# Patient Record
Sex: Male | Born: 1950 | Race: Black or African American | Hispanic: No | Marital: Single | Smoking: Former smoker
Health system: Southern US, Community
[De-identification: ages and names within clinical notes are randomized; demographics above are authoritative.]

## PROBLEM LIST (undated history)

## (undated) DIAGNOSIS — I509 Heart failure, unspecified: Secondary | ICD-10-CM

## (undated) DIAGNOSIS — G4733 Obstructive sleep apnea (adult) (pediatric): Secondary | ICD-10-CM

## (undated) DIAGNOSIS — I272 Pulmonary hypertension, unspecified: Secondary | ICD-10-CM

## (undated) DIAGNOSIS — I10 Essential (primary) hypertension: Secondary | ICD-10-CM

## (undated) DIAGNOSIS — J449 Chronic obstructive pulmonary disease, unspecified: Secondary | ICD-10-CM

## (undated) DIAGNOSIS — I447 Left bundle-branch block, unspecified: Secondary | ICD-10-CM

## (undated) DIAGNOSIS — R591 Generalized enlarged lymph nodes: Secondary | ICD-10-CM

## (undated) DIAGNOSIS — R0600 Dyspnea, unspecified: Secondary | ICD-10-CM

## (undated) DIAGNOSIS — K279 Peptic ulcer, site unspecified, unspecified as acute or chronic, without hemorrhage or perforation: Secondary | ICD-10-CM

## (undated) DIAGNOSIS — J45909 Unspecified asthma, uncomplicated: Secondary | ICD-10-CM

## (undated) DIAGNOSIS — I420 Dilated cardiomyopathy: Secondary | ICD-10-CM

## (undated) DIAGNOSIS — K922 Gastrointestinal hemorrhage, unspecified: Secondary | ICD-10-CM

## (undated) DIAGNOSIS — K209 Esophagitis, unspecified: Secondary | ICD-10-CM

## (undated) DIAGNOSIS — I5041 Acute combined systolic (congestive) and diastolic (congestive) heart failure: Secondary | ICD-10-CM

## (undated) DIAGNOSIS — I2510 Atherosclerotic heart disease of native coronary artery without angina pectoris: Secondary | ICD-10-CM

---
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
## 2006-05-12 ENCOUNTER — Emergency Department (HOSPITAL_COMMUNITY): Admission: EM | Admit: 2006-05-12 | Discharge: 2006-05-12 | Payer: Self-pay | Admitting: Emergency Medicine

## 2010-07-12 ENCOUNTER — Other Ambulatory Visit: Payer: Self-pay | Admitting: Family Medicine

## 2010-07-12 ENCOUNTER — Ambulatory Visit
Admission: RE | Admit: 2010-07-12 | Discharge: 2010-07-12 | Disposition: A | Discharge status: Home / Self Care | Payer: BC Managed Care – PPO | Source: Ambulatory Visit | Attending: Family Medicine | Admitting: Family Medicine

## 2010-07-12 DIAGNOSIS — R0602 Shortness of breath: Secondary | ICD-10-CM

## 2014-11-05 ENCOUNTER — Ambulatory Visit: Payer: Self-pay

## 2014-11-05 ENCOUNTER — Other Ambulatory Visit: Payer: Self-pay | Admitting: Occupational Medicine

## 2014-11-05 DIAGNOSIS — M79671 Pain in right foot: Secondary | ICD-10-CM

## 2014-11-05 DIAGNOSIS — M25571 Pain in right ankle and joints of right foot: Secondary | ICD-10-CM

## 2016-02-10 ENCOUNTER — Emergency Department (HOSPITAL_COMMUNITY)
Admission: EM | Admit: 2016-02-10 | Discharge: 2016-02-10 | Disposition: A | Discharge status: Home / Self Care | Payer: BC Managed Care – PPO | Attending: Emergency Medicine | Admitting: Emergency Medicine

## 2016-02-10 ENCOUNTER — Encounter (HOSPITAL_COMMUNITY): Payer: Self-pay

## 2016-02-10 DIAGNOSIS — Z87891 Personal history of nicotine dependence: Secondary | ICD-10-CM | POA: Diagnosis not present

## 2016-02-10 DIAGNOSIS — R42 Dizziness and giddiness: Secondary | ICD-10-CM

## 2016-02-10 LAB — I-STAT CHEM 8, ED
BUN: 22 mg/dL — AB (ref 6–20)
CALCIUM ION: 1.17 mmol/L (ref 1.15–1.40)
CHLORIDE: 100 mmol/L — AB (ref 101–111)
CREATININE: 1.1 mg/dL (ref 0.61–1.24)
GLUCOSE: 90 mg/dL (ref 65–99)
HCT: 34 % — ABNORMAL LOW (ref 39.0–52.0)
Hemoglobin: 11.6 g/dL — ABNORMAL LOW (ref 13.0–17.0)
Potassium: 4.6 mmol/L (ref 3.5–5.1)
SODIUM: 140 mmol/L (ref 135–145)
TCO2: 32 mmol/L (ref 0–100)

## 2016-02-10 NOTE — ED Triage Notes (Signed)
[facility]- pt coming from work. Pt bent over, had a dizzy spell that lasted approximately 5 minutes. Pt denies chest pain at any point, pt denies dizziness on arrival. Vital signs stable, CBG 102. Pt denies cardiac hx, however LBBB noted on 12-lead with EMS. Pt a&o X4.

## 2016-02-10 NOTE — ED Provider Notes (Signed)
[facility] DEPT Provider Note   CSN: [id number] Arrival date & time: 02/10/16  1725     History   Chief Complaint Chief Complaint  Patient presents with  . Dizziness    HPI NICOLAE DANN is a 65 y.o. male.  The history is provided by the patient.  Dizziness  Quality:  Room spinning Severity:  Mild Onset quality:  Sudden Duration: 5 minutes. Timing:  Sporadic Progression:  Resolved Chronicity:  New Context: bending over and head movement   Relieved by:  Being still Worsened by:  Nothing Associated symptoms: no headaches, no shortness of breath and no vomiting   Risk factors: no hx of stroke     No past medical history on file.  There are no active problems to display for this patient.   No past surgical history on file.     Home Medications    Prior to Admission medications   Medication Sig Start Date End Date Taking? Authorizing Provider  albuterol (PROVENTIL HFA;VENTOLIN HFA) 108 (90 Base) MCG/ACT inhaler Inhale 2 puffs into the lungs every 6 (six) hours as needed for wheezing or shortness of breath.   Yes Historical Provider, MD  hydrochlorothiazide (HYDRODIURIL) 25 MG tablet Take 25 mg by mouth daily.   Yes Historical Provider, MD    Family History No family history on file.  Social History Social History  Substance Use Topics  . Smoking status: Former Games developer  . Smokeless tobacco: Never Used  . Alcohol use Not on file     Allergies   Penicillins   Review of Systems Review of Systems  Respiratory: Negative for shortness of breath.   Gastrointestinal: Negative for vomiting.  Neurological: Positive for dizziness. Negative for headaches.  All other systems reviewed and are negative.    Physical Exam Updated Vital Signs BP 144/83   Pulse 92   Temp 98.1 F (36.7 C) (Oral)   Resp 21   SpO2 96%   Physical Exam  Constitutional: He is oriented to person, place, and time. He appears well-developed and well-nourished. No distress.    HENT:  Head: Normocephalic and atraumatic.  Eyes: Conjunctivae are normal.  Neck: Neck supple. No tracheal deviation present.  Cardiovascular: Normal rate, regular rhythm and normal heart sounds.   Pulmonary/Chest: Effort normal and breath sounds normal. No respiratory distress.  Abdominal: Soft. He exhibits no distension.  Neurological: He is alert and oriented to person, place, and time. He has normal strength. No cranial nerve deficit or sensory deficit. Coordination and gait normal. GCS eye subscore is 4. GCS verbal subscore is 5. GCS motor subscore is 6.  Normal finger to nose testing and rapid alternating movement   Skin: Skin is warm and dry.  Psychiatric: He has a normal mood and affect.     ED Treatments / Results  Labs (all labs ordered are listed, but only abnormal results are displayed) Labs Reviewed  I-STAT CHEM 8, ED - Abnormal; Notable for the following:       Result Value   Chloride 100 (*)    BUN 22 (*)    Hemoglobin 11.6 (*)    HCT 34.0 (*)    All other components within normal limits    EKG  EKG Interpretation  Date/Time:  Friday February 10 2016 17:35:22 EDT Ventricular Rate:  92 PR Interval:    QRS Duration: 142 QT Interval:  406 QTC Calculation: 503 R Axis:   112 Text Interpretation:  Sinus rhythm Probable left atrial enlargement Consider left ventricular  hypertrophy Borderline T abnormalities, lateral leads Incomplete left bundle branch block Prolonged QT interval No previous tracing Confirmed by Zaylei Mullane MD, Xan Sparkman ([id number]) on 02/10/2016 5:50:02 PM       Radiology No results found.  Procedures Procedures (including critical care time)  Medications Ordered in ED Medications - No data to display   Initial Impression / Assessment and Plan / ED Course  I have reviewed the triage vital signs and the nursing notes.  Pertinent labs & imaging results that were available during my care of the patient were reviewed by me and considered in my medical  decision making (see chart for details).  Clinical Course    65 y.o. male presents with dizziness after having his head down and standing up quickly. Symptoms persisted and on arrival of EMS began resolving, completely resolved on arrival here. EKG interpreted by me without ST or T wave changes concerning for myocardial ischemia. No delta wave, no prolonged QTc, no brugada to suggest arrhythmogenicity.  No significant hematologic or metabolic abnormalities to explain symptoms. No indication for emergent neuroimaging with resolved symptoms likely positional vertigo vs brief orthostasis. Plan to follow up with PCP as needed and return precautions discussed for worsening or new concerning symptoms.   Final Clinical Impressions(s) / ED Diagnoses   Final diagnoses:  Dizziness    New Prescriptions New Prescriptions   No medications on file     Lyndal Pulleyaniel Lalia Loudon, MD 02/11/16 [phone]

## 2016-02-10 NOTE — ED Notes (Addendum)
Pt was able to ambulate with no help from staff. He was able to use the restroom with no assistance and had no complaints while ambulating or after. RR remained normal and could talk in full sentences throughout.

## 2016-08-10 ENCOUNTER — Emergency Department (HOSPITAL_COMMUNITY): Payer: BC Managed Care – PPO

## 2016-08-10 ENCOUNTER — Inpatient Hospital Stay (HOSPITAL_COMMUNITY)
Admission: EM | Admit: 2016-08-10 | Discharge: 2016-08-17 | DRG: 377 | Disposition: A | Discharge status: Home / Self Care | Payer: BC Managed Care – PPO | Attending: Internal Medicine | Admitting: Internal Medicine

## 2016-08-10 ENCOUNTER — Encounter (HOSPITAL_COMMUNITY): Payer: Self-pay

## 2016-08-10 DIAGNOSIS — R591 Generalized enlarged lymph nodes: Secondary | ICD-10-CM | POA: Diagnosis present

## 2016-08-10 DIAGNOSIS — K254 Chronic or unspecified gastric ulcer with hemorrhage: Secondary | ICD-10-CM | POA: Diagnosis not present

## 2016-08-10 DIAGNOSIS — I959 Hypotension, unspecified: Secondary | ICD-10-CM | POA: Diagnosis not present

## 2016-08-10 DIAGNOSIS — I420 Dilated cardiomyopathy: Secondary | ICD-10-CM | POA: Diagnosis present

## 2016-08-10 DIAGNOSIS — K279 Peptic ulcer, site unspecified, unspecified as acute or chronic, without hemorrhage or perforation: Secondary | ICD-10-CM | POA: Diagnosis present

## 2016-08-10 DIAGNOSIS — K209 Esophagitis, unspecified without bleeding: Secondary | ICD-10-CM | POA: Diagnosis present

## 2016-08-10 DIAGNOSIS — D649 Anemia, unspecified: Secondary | ICD-10-CM | POA: Diagnosis present

## 2016-08-10 DIAGNOSIS — I10 Essential (primary) hypertension: Secondary | ICD-10-CM | POA: Diagnosis present

## 2016-08-10 DIAGNOSIS — R0602 Shortness of breath: Secondary | ICD-10-CM | POA: Diagnosis not present

## 2016-08-10 DIAGNOSIS — E669 Obesity, unspecified: Secondary | ICD-10-CM | POA: Diagnosis present

## 2016-08-10 DIAGNOSIS — I472 Ventricular tachycardia: Secondary | ICD-10-CM | POA: Diagnosis not present

## 2016-08-10 DIAGNOSIS — Z7982 Long term (current) use of aspirin: Secondary | ICD-10-CM

## 2016-08-10 DIAGNOSIS — Z7951 Long term (current) use of inhaled steroids: Secondary | ICD-10-CM

## 2016-08-10 DIAGNOSIS — R0609 Other forms of dyspnea: Secondary | ICD-10-CM

## 2016-08-10 DIAGNOSIS — I5042 Chronic combined systolic (congestive) and diastolic (congestive) heart failure: Secondary | ICD-10-CM | POA: Diagnosis present

## 2016-08-10 DIAGNOSIS — R600 Localized edema: Secondary | ICD-10-CM | POA: Diagnosis present

## 2016-08-10 DIAGNOSIS — Z791 Long term (current) use of non-steroidal anti-inflammatories (NSAID): Secondary | ICD-10-CM

## 2016-08-10 DIAGNOSIS — I447 Left bundle-branch block, unspecified: Secondary | ICD-10-CM | POA: Diagnosis present

## 2016-08-10 DIAGNOSIS — I5041 Acute combined systolic (congestive) and diastolic (congestive) heart failure: Secondary | ICD-10-CM | POA: Diagnosis present

## 2016-08-10 DIAGNOSIS — I2722 Pulmonary hypertension due to left heart disease: Secondary | ICD-10-CM | POA: Diagnosis present

## 2016-08-10 DIAGNOSIS — T39395A Adverse effect of other nonsteroidal anti-inflammatory drugs [NSAID], initial encounter: Secondary | ICD-10-CM | POA: Diagnosis present

## 2016-08-10 DIAGNOSIS — K210 Gastro-esophageal reflux disease with esophagitis: Secondary | ICD-10-CM | POA: Diagnosis present

## 2016-08-10 DIAGNOSIS — R599 Enlarged lymph nodes, unspecified: Secondary | ICD-10-CM | POA: Diagnosis present

## 2016-08-10 DIAGNOSIS — N179 Acute kidney failure, unspecified: Secondary | ICD-10-CM | POA: Diagnosis present

## 2016-08-10 DIAGNOSIS — I110 Hypertensive heart disease with heart failure: Secondary | ICD-10-CM | POA: Diagnosis present

## 2016-08-10 DIAGNOSIS — K922 Gastrointestinal hemorrhage, unspecified: Secondary | ICD-10-CM

## 2016-08-10 DIAGNOSIS — Z6833 Body mass index (BMI) 33.0-33.9, adult: Secondary | ICD-10-CM

## 2016-08-10 DIAGNOSIS — J45909 Unspecified asthma, uncomplicated: Secondary | ICD-10-CM | POA: Diagnosis present

## 2016-08-10 DIAGNOSIS — Z87891 Personal history of nicotine dependence: Secondary | ICD-10-CM

## 2016-08-10 DIAGNOSIS — K5900 Constipation, unspecified: Secondary | ICD-10-CM | POA: Diagnosis present

## 2016-08-10 DIAGNOSIS — D62 Acute posthemorrhagic anemia: Secondary | ICD-10-CM | POA: Diagnosis present

## 2016-08-10 DIAGNOSIS — I272 Pulmonary hypertension, unspecified: Secondary | ICD-10-CM | POA: Diagnosis present

## 2016-08-10 DIAGNOSIS — I2510 Atherosclerotic heart disease of native coronary artery without angina pectoris: Secondary | ICD-10-CM | POA: Diagnosis present

## 2016-08-10 DIAGNOSIS — K449 Diaphragmatic hernia without obstruction or gangrene: Secondary | ICD-10-CM | POA: Diagnosis present

## 2016-08-10 HISTORY — DX: Left bundle-branch block, unspecified: I44.7

## 2016-08-10 HISTORY — DX: Esophagitis, unspecified: K20.9

## 2016-08-10 HISTORY — DX: Dilated cardiomyopathy: I42.0

## 2016-08-10 HISTORY — DX: Generalized enlarged lymph nodes: R59.1

## 2016-08-10 HISTORY — DX: Pulmonary hypertension, unspecified: I27.20

## 2016-08-10 HISTORY — DX: Unspecified asthma, uncomplicated: J45.909

## 2016-08-10 HISTORY — DX: Essential (primary) hypertension: I10

## 2016-08-10 HISTORY — DX: Gastrointestinal hemorrhage, unspecified: K92.2

## 2016-08-10 HISTORY — DX: Acute combined systolic (congestive) and diastolic (congestive) heart failure: I50.41

## 2016-08-10 HISTORY — DX: Peptic ulcer, site unspecified, unspecified as acute or chronic, without hemorrhage or perforation: K27.9

## 2016-08-10 HISTORY — DX: Atherosclerotic heart disease of native coronary artery without angina pectoris: I25.10

## 2016-08-10 LAB — BASIC METABOLIC PANEL
ANION GAP: 9 (ref 5–15)
BUN: 20 mg/dL (ref 6–20)
CALCIUM: 8.8 mg/dL — AB (ref 8.9–10.3)
CHLORIDE: 104 mmol/L (ref 101–111)
CO2: 26 mmol/L (ref 22–32)
Creatinine, Ser: 1.2 mg/dL (ref 0.61–1.24)
GFR calc Af Amer: >60 mL/min (ref >60)
GFR calc non Af Amer: >60 mL/min (ref >60)
GLUCOSE: 116 mg/dL — AB (ref 65–99)
Potassium: 4 mmol/L (ref 3.5–5.1)
Sodium: 139 mmol/L (ref 135–145)

## 2016-08-10 LAB — CBC
HEMATOCRIT: 26.7 % — AB (ref 39.0–52.0)
HEMOGLOBIN: 7.5 g/dL — AB (ref 13.0–17.0)
MCH: 20.6 pg — AB (ref 26.0–34.0)
MCHC: 28.1 g/dL — AB (ref 30.0–36.0)
MCV: 73.4 fL — AB (ref 78.0–100.0)
Platelets: 452 10*3/uL — ABNORMAL HIGH (ref 150–400)
RBC: 3.64 MIL/uL — AB (ref 4.22–5.81)
RDW: 18.4 % — ABNORMAL HIGH (ref 11.5–15.5)
WBC: 9.1 10*3/uL (ref 4.0–10.5)

## 2016-08-10 LAB — ABO/RH: ABO/RH(D): B POS

## 2016-08-10 LAB — PROTIME-INR
INR: 1.24
Prothrombin Time: 15.6 seconds — ABNORMAL HIGH (ref 11.4–15.2)

## 2016-08-10 LAB — POC OCCULT BLOOD, ED: FECAL OCCULT BLD: POSITIVE — AB

## 2016-08-10 LAB — I-STAT TROPONIN, ED: TROPONIN I, POC: 0.01 ng/mL (ref 0.00–0.08)

## 2016-08-10 LAB — PREPARE RBC (CROSSMATCH)

## 2016-08-10 LAB — BRAIN NATRIURETIC PEPTIDE: B NATRIURETIC PEPTIDE 5: 847.8 pg/mL — AB (ref 0.0–100.0)

## 2016-08-10 LAB — APTT: aPTT: 31 seconds (ref 24–36)

## 2016-08-10 MED ORDER — ONDANSETRON HCL 4 MG/2ML IJ SOLN: 4.0000 mg | Freq: Four times a day (QID) | INTRAMUSCULAR | Status: DC | PRN

## 2016-08-10 MED ORDER — MAGNESIUM SULFATE 2 GM/50ML IV SOLN
2.0000 g | Freq: Once | INTRAVENOUS | Status: AC
  Administered 2016-08-10: 2 g via INTRAVENOUS
  Filled 2016-08-10: qty 50

## 2016-08-10 MED ORDER — MOMETASONE FURO-FORMOTEROL FUM 100-5 MCG/ACT IN AERO
2.0000 | INHALATION_SPRAY | Freq: Two times a day (BID) | RESPIRATORY_TRACT | Status: DC
  Administered 2016-08-11 – 2016-08-17 (×8): 2 via RESPIRATORY_TRACT
  Filled 2016-08-10 (×2): qty 8.8

## 2016-08-10 MED ORDER — PANTOPRAZOLE SODIUM 40 MG IV SOLR
40.0000 mg | Freq: Once | INTRAVENOUS | Status: AC
  Administered 2016-08-10: 40 mg via INTRAVENOUS
  Filled 2016-08-10: qty 40

## 2016-08-10 MED ORDER — POLYETHYLENE GLYCOL 3350 17 G PO PACK: 17.0000 g | PACK | Freq: Every day | ORAL | Status: DC | PRN

## 2016-08-10 MED ORDER — ALBUTEROL SULFATE (2.5 MG/3ML) 0.083% IN NEBU
2.5000 mg | INHALATION_SOLUTION | Freq: Four times a day (QID) | RESPIRATORY_TRACT | Status: DC
  Administered 2016-08-10: 2.5 mg via RESPIRATORY_TRACT
  Filled 2016-08-10: qty 3

## 2016-08-10 MED ORDER — PANTOPRAZOLE SODIUM 40 MG IV SOLR
40.0000 mg | Freq: Two times a day (BID) | INTRAVENOUS | Status: DC
  Administered 2016-08-10: 40 mg via INTRAVENOUS
  Filled 2016-08-10 (×2): qty 40

## 2016-08-10 MED ORDER — IPRATROPIUM BROMIDE 0.02 % IN SOLN
0.5000 mg | Freq: Four times a day (QID) | RESPIRATORY_TRACT | Status: DC
  Administered 2016-08-10: 0.5 mg via RESPIRATORY_TRACT
  Filled 2016-08-10: qty 2.5

## 2016-08-10 MED ORDER — IPRATROPIUM-ALBUTEROL 0.5-2.5 (3) MG/3ML IN SOLN
3.0000 mL | Freq: Two times a day (BID) | RESPIRATORY_TRACT | Status: DC
  Administered 2016-08-11 – 2016-08-12 (×3): 3 mL via RESPIRATORY_TRACT
  Filled 2016-08-10 (×3): qty 3

## 2016-08-10 MED ORDER — ACETAMINOPHEN 325 MG PO TABS
650.0000 mg | ORAL_TABLET | Freq: Four times a day (QID) | ORAL | Status: DC | PRN
  Administered 2016-08-12: 650 mg via ORAL
  Filled 2016-08-10: qty 2

## 2016-08-10 MED ORDER — FUROSEMIDE 10 MG/ML IJ SOLN
40.0000 mg | Freq: Every day | INTRAMUSCULAR | Status: DC
  Administered 2016-08-11: 40 mg via INTRAVENOUS
  Filled 2016-08-10: qty 4

## 2016-08-10 MED ORDER — SODIUM CHLORIDE 0.9 % IV SOLN
INTRAVENOUS | Status: DC
  Administered 2016-08-11: 11:00:00 via INTRAVENOUS

## 2016-08-10 MED ORDER — ACETAMINOPHEN 650 MG RE SUPP: 650.0000 mg | Freq: Four times a day (QID) | RECTAL | Status: DC | PRN

## 2016-08-10 MED ORDER — CYCLOBENZAPRINE HCL 10 MG PO TABS
10.0000 mg | ORAL_TABLET | Freq: Three times a day (TID) | ORAL | Status: DC | PRN
  Administered 2016-08-12: 10 mg via ORAL
  Filled 2016-08-10: qty 1

## 2016-08-10 MED ORDER — ONDANSETRON HCL 4 MG PO TABS: 4.0000 mg | ORAL_TABLET | Freq: Four times a day (QID) | ORAL | Status: DC | PRN

## 2016-08-10 MED ORDER — SODIUM CHLORIDE 0.9% FLUSH
3.0000 mL | Freq: Two times a day (BID) | INTRAVENOUS | Status: DC
  Administered 2016-08-10 – 2016-08-17 (×12): 3 mL via INTRAVENOUS

## 2016-08-10 MED ORDER — GUAIFENESIN ER 600 MG PO TB12
600.0000 mg | ORAL_TABLET | Freq: Two times a day (BID) | ORAL | Status: DC
  Administered 2016-08-10 – 2016-08-17 (×13): 600 mg via ORAL
  Filled 2016-08-10 (×13): qty 1

## 2016-08-10 MED ORDER — SODIUM CHLORIDE 0.9 % IV SOLN
10.0000 mL/h | Freq: Once | INTRAVENOUS | Status: AC
  Administered 2016-08-10: 10 mL/h via INTRAVENOUS

## 2016-08-10 NOTE — H&P (Signed)
[facility] History and Physical   Patient: Kristopher [name]   PCP: Frederich Chick, MD DOB: [birth year]   DOA: 08/10/2016   DOS: 08/10/2016   DOS: the patient was seen and examined on 08/10/2016  Patient coming from: The patient is coming from home.  Chief Complaint: Shortness of breath on exertion  HPI: Kristopher [name] is a 66 y.o. male with Past medical history of asthma. Patient presented with complaints of dyspnea on exertion ongoing for last 1 week. Progressively worsening. Patient also has noted swelling in his legs for last 10 days. He denies having any complains of chest pain or chest tightness. He mentions that he had hard black color bowel movement for last 4 days. Denies having any coaltar like bowel movement.  No nausea no vomiting. Exam complains about having acid reflux. Has been taking on and off Aleve and also on aspirin 325 mg. Reportedly skunk schedule mole because well. Denies having any prior complaints of GI bleed. Denies any alcohol abuse. He denies having any fever or chills but has off and on cough. No orthopnea or PND.  ED Course: Started on 2 units of PRBC after identifying severe anemia. GI was consulted. Patient was referred for admission.  At his baseline ambulates without any support And is independent for most of his ADL; manages his medication on his own.  Review of Systems: as mentioned in the history of present illness.  All other systems reviewed and are negative.  Past Medical History:  Diagnosis Date  . Asthma    History reviewed. No pertinent surgical history. Social History:  reports that he has quit smoking. He has never used smokeless tobacco. He reports that he does not drink alcohol or use drugs.  Allergies  Allergen Reactions  . Penicillins     Pt reports it makes him feel shaky Has patient had a PCN reaction causing immediate rash, facial/tongue/throat swelling, SOB or lightheadedness with hypotension: YES Has patient had  a PCN reaction causing severe rash involving mucus membranes or skin necrosis: NO Has patient had a PCN reaction that required hospitalization NO Has patient had a PCN reaction occurring within the last 10 years: NO If all of the above answers are "NO", then may proceed with Cephalosporin use.    Family History  Problem Relation Age of Onset  . Hypertension Mother      Prior to Admission medications   Medication Sig Start Date End Date Taking? Authorizing Provider  albuterol (PROVENTIL HFA;VENTOLIN HFA) 108 (90 Base) MCG/ACT inhaler Inhale 2 puffs into the lungs every 6 (six) hours as needed for wheezing or shortness of breath.   Yes Historical Provider, MD  budesonide-formoterol (SYMBICORT) 80-4.5 MCG/ACT inhaler Inhale 1 puff into the lungs daily.   Yes Historical Provider, MD  cyclobenzaprine (FLEXERIL) 10 MG tablet Take 10 mg by mouth 3 (three) times daily as needed for muscle spasms.   Yes Historical Provider, MD  meloxicam (MOBIC) 15 MG tablet Take 15 mg by mouth daily.   Yes Historical Provider, MD    Physical Exam: Vitals:   08/10/16 1816 08/10/16 1819 08/10/16 1830 08/10/16 1835  BP: 146/92 146/92 135/85 135/85  Pulse: 103 103 107 101  Resp: 20 20 24 21   Temp: 98.6 F (37 C) 98.6 F (37 C)  97.9 F (36.6 C)  TempSrc: Oral Oral  Oral  SpO2: 98% 95% 98% 95%  Weight:      Height:        General: Alert,  Awake and Oriented to Time, Place and Person. Appear in mild distress, affect appropriate Eyes: PERRL, Conjunctiva normal ENT: Oral Mucosa clear moist. Neck: difficult to assess JVD, no Abnormal Mass Or lumps Cardiovascular: S1 and S2 Present, no Murmur, Peripheral Pulses Present Respiratory: Bilateral Air entry equal and Decreased, no use of accessory muscle, basal Crackles, bilateral wheezes Abdomen: Bowel Sound present, Soft and no tenderness Skin: no redness, no Rash, no induration Extremities: bilateral Pedal edema, no calf tenderness Neurologic: Grossly no focal  neuro deficit. Bilaterally Equal motor strength  Labs on Admission:  CBC:  Recent Labs Lab 08/10/16 1418  WBC 9.1  HGB 7.5*  HCT 26.7*  MCV 73.4*  PLT 452*   Basic Metabolic Panel:  Recent Labs Lab 08/10/16 1418  NA 139  K 4.0  CL 104  CO2 26  GLUCOSE 116*  BUN 20  CREATININE 1.20  CALCIUM 8.8*   GFR: Estimated Creatinine Clearance: 70.9 mL/min (by C-G formula based on SCr of 1.2 mg/dL). Liver Function Tests: No results for input(s): AST, ALT, ALKPHOS, BILITOT, PROT, ALBUMIN in the last 168 hours. No results for input(s): LIPASE, AMYLASE in the last 168 hours. No results for input(s): AMMONIA in the last 168 hours. Coagulation Profile:  Recent Labs Lab 08/10/16 1647  INR 1.24   Cardiac Enzymes: No results for input(s): CKTOTAL, CKMB, CKMBINDEX, TROPONINI in the last 168 hours. BNP (last 3 results) No results for input(s): PROBNP in the last 8760 hours. HbA1C: No results for input(s): HGBA1C in the last 72 hours. CBG: No results for input(s): GLUCAP in the last 168 hours. Lipid Profile: No results for input(s): CHOL, HDL, LDLCALC, TRIG, CHOLHDL, LDLDIRECT in the last 72 hours. Thyroid Function Tests: No results for input(s): TSH, T4TOTAL, FREET4, T3FREE, THYROIDAB in the last 72 hours. Anemia Panel: No results for input(s): VITAMINB12, FOLATE, FERRITIN, TIBC, IRON, RETICCTPCT in the last 72 hours. Urine analysis: No results found for: COLORURINE, APPEARANCEUR, LABSPEC, PHURINE, GLUCOSEU, HGBUR, BILIRUBINUR, KETONESUR, PROTEINUR, UROBILINOGEN, NITRITE, LEUKOCYTESUR  Radiological Exams on Admission: Dg Chest 2 View  Result Date: 08/10/2016 CLINICAL DATA:  Cough and short of breath EXAM: CHEST  2 VIEW COMPARISON:  07/12/2010 FINDINGS: Cardiac enlargement. Negative for heart failure. Lungs are clear without infiltrate effusion or mass. Degenerative changes in the shoulder joints bilaterally. IMPRESSION: No active cardiopulmonary disease. Electronically Signed    By: Marlan Palau M.D.   On: 08/10/2016 14:40   EKG: Independently reviewed. normal sinus rhythm, LBBB.  Assessment/Plan 1. Acute upper GI bleed Patient presents with complains of dyspnea on exertion. Found to have severe anemia here in the hospital. Complains about dark black color bowel movement for last 4 days. Suspected to have GI bleed with positive Hemoccult. GI consulted. Possible EGD tomorrow morning. Receiving 2 PRBC right now. We will recheck a repeat CBC later on. Bolus in the ER, continue with Protonix every 12 hours. Patient has used NSAIDs as well as aspirin in recent past probably the cause of patient's GI bleed. We'll start on iron supplementation tomorrow.  2. Dyspnea on exertion with pedal edema. There was initial concern for ACS but patient denies having any complains of chest pain or chest tightness and his dyspnea on exertion likely secondary to anemia. Patient has chronic LBBB. Bilateral lower extended swelling concerning for CHF and a picture as well. We will give IV Lasix after PRBC transfusion. Echocardiogram in the morning.  Nutrition: Clear liquid diet, nothing by mouth after midnight DVT Prophylaxis:mechanical compression device  Advance goals of  care discussion: full code   Consults: gastroenterology, Dr Randa Evens  Family Communication: family was present at bedside, at the time of interview.  Opportunity was given to ask question and all questions were answered satisfactorily.  Disposition: Admitted as observation, telemetry unit. Likely to be discharged home, in 2 days.  Author: Lynden Oxford, MD [facility] Pager: [phone] 08/10/2016  If 7PM-7AM, please contact night-coverage [email or web address] Password [email or web address]

## 2016-08-10 NOTE — Progress Notes (Signed)
Verbal order given by Doctor Vonna Drafts transfuse blood over three hours without blood warmer.

## 2016-08-10 NOTE — ED Triage Notes (Signed)
Per Pt, Pt is coming from [facility] where he was seen for four days of fatigue, SOB, and bilateral leg swelling. Reports having some cough with small productive whit sputum. Denies any pain, lightheadedness, N/V

## 2016-08-10 NOTE — ED Provider Notes (Addendum)
[facility]PT Provider Note   CSN: [id number] Arrival date & time: 08/10/16  1406     History   Chief Complaint Chief Complaint  Patient presents with  . Shortness of Breath  . Leg Swelling    HPI Kristopher [name] is a 66 y.o. male.  Patient is a 66 year old male with a significant medical history for asthma only presenting today with complaints of near syncope, exertional dyspnea and black stool for the last 4 days. Patient was seen by PCP today and was sent here because they thought he may be having a heart attack. However patient denies any chest pain, tightness for arm pain. He denies any abdominal pain, nausea or vomiting. He did noticed the black stool for the last few days which is different from his normal. He also for the last 3 days has not had much of an appetite. In the last few days he's also noticed swelling in his lower extremities. This is never happened before. Patient states that he does take an 81 mg aspirin daily for heart health. He does not use alcohol, drugs or tobacco. He was taking Aleve for 2-3 years and recently stopped about 2-3 weeks ago. He takes no other anticoagulation.   The history is provided by the patient.  Shortness of Breath  This is a new problem.    Past Medical History:  Diagnosis Date  . Asthma     There are no active problems to display for this patient.   History reviewed. No pertinent surgical history.     Home Medications    Prior to Admission medications   Medication Sig Start Date End Date Taking? Authorizing Provider  albuterol (PROVENTIL HFA;VENTOLIN HFA) 108 (90 Base) MCG/ACT inhaler Inhale 2 puffs into the lungs every 6 (six) hours as needed for wheezing or shortness of breath.    Historical Provider, MD  hydrochlorothiazide (HYDRODIURIL) 25 MG tablet Take 25 mg by mouth daily.    Historical Provider, MD    Family History No family history on file.  Social History Social History  Substance Use Topics  .  Smoking status: Former Games developer  . Smokeless tobacco: Never Used  . Alcohol use No     Allergies   Penicillins   Review of Systems Review of Systems  Respiratory: Positive for shortness of breath.   All other systems reviewed and are negative.    Physical Exam Updated Vital Signs BP 132/75 (BP Location: Left Arm)   Pulse 103   Temp 98.9 F (37.2 C) (Oral)   Resp 20   Ht 5\' 7"  (1.702 m)   Wt 232 lb (105.2 kg)   SpO2 99%   BMI 36.34 kg/m   Physical Exam  Constitutional: He is oriented to person, place, and time. He appears well-developed and well-nourished. No distress.  HENT:  Head: Normocephalic and atraumatic.  Mouth/Throat: Oropharynx is clear and moist.  Eyes: EOM are normal. Pupils are equal, round, and reactive to light.  Pale conjunctiva and pale gums  Neck: Normal range of motion. Neck supple.  Cardiovascular: Normal rate, regular rhythm and intact distal pulses.   No murmur heard. Pulmonary/Chest: Effort normal and breath sounds normal. No respiratory distress. He has no wheezes. He has no rales.  Abdominal: Soft. He exhibits no distension. There is no tenderness. There is no rebound and no guarding.  Genitourinary: Rectal exam shows guaiac positive stool.  Genitourinary Comments: Black stool  Musculoskeletal: Normal range of motion. He exhibits no edema or tenderness.  Neurological: He is alert and oriented to person, place, and time.  Skin: Skin is warm and dry. No rash noted. No erythema. There is pallor.  Psychiatric: He has a normal mood and affect. His behavior is normal.  Nursing note and vitals reviewed.    ED Treatments / Results  Labs (all labs ordered are listed, but only abnormal results are displayed) Labs Reviewed  BASIC METABOLIC PANEL - Abnormal; Notable for the following:       Result Value   Glucose, Bld 116 (*)    Calcium 8.8 (*)    All other components within normal limits  CBC - Abnormal; Notable for the following:    RBC 3.64  (*)    Hemoglobin 7.5 (*)    HCT 26.7 (*)    MCV 73.4 (*)    MCH 20.6 (*)    MCHC 28.1 (*)    RDW 18.4 (*)    Platelets 452 (*)    All other components within normal limits  PROTIME-INR - Abnormal; Notable for the following:    Prothrombin Time 15.6 (*)    All other components within normal limits  POC OCCULT BLOOD, ED - Abnormal; Notable for the following:    Fecal Occult Bld POSITIVE (*)    All other components within normal limits  APTT  I-STAT TROPOININ, ED  TYPE AND SCREEN  ABO/RH  PREPARE RBC (CROSSMATCH)    EKG  EKG Interpretation  Date/Time:  Friday August 10 2016 14:13:38 EST Ventricular Rate:  107 PR Interval:  158 QRS Duration: 136 QT Interval:  386 QTC Calculation: 515 R Axis:   138 Text Interpretation:  Sinus tachycardia Right axis deviation Left bundle branch block Abnormal QRS-T angle, consider primary T wave abnormality Confirmed by Anitra Lauth  MD, Krystian Ferrentino ([id number]) on 08/10/2016 4:16:42 PM       Radiology Dg Chest 2 View  Result Date: 08/10/2016 CLINICAL DATA:  Cough and short of breath EXAM: CHEST  2 VIEW COMPARISON:  07/12/2010 FINDINGS: Cardiac enlargement. Negative for heart failure. Lungs are clear without infiltrate effusion or mass. Degenerative changes in the shoulder joints bilaterally. IMPRESSION: No active cardiopulmonary disease. Electronically Signed   By: Marlan Palau M.D.   On: 08/10/2016 14:40    Procedures Procedures (including critical care time)  Medications Ordered in ED Medications - No data to display   Initial Impression / Assessment and Plan / ED Course  I have reviewed the triage vital signs and the nursing notes.  Pertinent labs & imaging results that were available during my care of the patient were reviewed by me and considered in my medical decision making (see chart for details).     Patient is here today with exertional dyspnea, near syncope with standing and no leg swelling. Patient found today to be anemic with a  hemoglobin of 7.1. He also had started having dark stools which is most likely from prolonged Aleve use taking an 81 mg aspirin. Patient has no abdominal pain at this time. Patient consented for blood transfusion. Currently hemodynamically stable but mildly tachycardic at 103. Troponin and EKG without acute findings. Patient was started on Protonix. Will discuss with GI and admit for further care. 5:51 PM Spoke with Dr. Dulce Sellar recommended nothing by mouth after midnight for endoscopy tomorrow  CRITICAL CARE Performed by: Gwyneth Sprout Total critical care time: 30 minutes Critical care time was exclusive of separately billable procedures and treating other patients. Critical care was necessary to treat or prevent imminent or life-threatening deterioration. Critical  care was time spent personally by me on the following activities: development of treatment plan with patient and/or surrogate as well as nursing, discussions with consultants, evaluation of patient's response to treatment, examination of patient, obtaining history from patient or surrogate, ordering and performing treatments and interventions, ordering and review of laboratory studies, ordering and review of radiographic studies, pulse oximetry and re-evaluation of patient's condition.   Final Clinical Impressions(s) / ED Diagnoses   Final diagnoses:  Anemia, unspecified type  Acute upper GI bleeding    New Prescriptions New Prescriptions   No medications on file     Gwyneth Sprout, MD 08/10/16 1745    Gwyneth Sprout, MD 08/10/16 1753

## 2016-08-10 NOTE — Progress Notes (Signed)
Pt arrived from ED, accompanied by family members. One unit of blood transfusing. Pt alert and oriented x 4, ambulatory, VS stable, no signs of acute distress. Pt advised about valuable policy, cardiac monitor placed and pt oriented to room and equipments. Will continue to monitor and treat patient.

## 2016-08-10 NOTE — Progress Notes (Signed)
Patient ID: Kristopher [name], male   DOB: May 03, 1951, 66 y.o.   MRN: [id number] The staff has reported that the patient had a 5 beat run of Vtach post nebulizer treatment. No apparent symptomatology associated with it. Potassium level was 4.0 earlier today. Magnesium sulfate 2 grams IVPB ordered. Continue cardiac telemetry.  Sanda Klein, M.D. [phone].

## 2016-08-10 NOTE — Progress Notes (Addendum)
Pt had 5 beat run of Vtach, pt denied increased SOB and chest pain, VS stable, Dr. Robb Matar notified. Magnesium sulfate ordered, will recheck potassium in am.

## 2016-08-11 ENCOUNTER — Observation Stay (HOSPITAL_COMMUNITY): Payer: BC Managed Care – PPO | Admitting: Anesthesiology

## 2016-08-11 ENCOUNTER — Observation Stay (HOSPITAL_BASED_OUTPATIENT_CLINIC_OR_DEPARTMENT_OTHER): Payer: BC Managed Care – PPO

## 2016-08-11 ENCOUNTER — Encounter (HOSPITAL_COMMUNITY): Payer: Self-pay

## 2016-08-11 ENCOUNTER — Encounter (HOSPITAL_COMMUNITY)
Admission: EM | Disposition: A | Discharge status: Home / Self Care | Payer: Self-pay | Source: Home / Self Care | Attending: Internal Medicine

## 2016-08-11 DIAGNOSIS — I2722 Pulmonary hypertension due to left heart disease: Secondary | ICD-10-CM | POA: Diagnosis present

## 2016-08-11 DIAGNOSIS — E669 Obesity, unspecified: Secondary | ICD-10-CM | POA: Diagnosis present

## 2016-08-11 DIAGNOSIS — I472 Ventricular tachycardia: Secondary | ICD-10-CM | POA: Diagnosis not present

## 2016-08-11 DIAGNOSIS — I447 Left bundle-branch block, unspecified: Secondary | ICD-10-CM | POA: Diagnosis present

## 2016-08-11 DIAGNOSIS — I5041 Acute combined systolic (congestive) and diastolic (congestive) heart failure: Secondary | ICD-10-CM | POA: Diagnosis present

## 2016-08-11 DIAGNOSIS — Z7982 Long term (current) use of aspirin: Secondary | ICD-10-CM | POA: Diagnosis not present

## 2016-08-11 DIAGNOSIS — I5042 Chronic combined systolic (congestive) and diastolic (congestive) heart failure: Secondary | ICD-10-CM | POA: Diagnosis present

## 2016-08-11 DIAGNOSIS — I5021 Acute systolic (congestive) heart failure: Secondary | ICD-10-CM | POA: Diagnosis not present

## 2016-08-11 DIAGNOSIS — K449 Diaphragmatic hernia without obstruction or gangrene: Secondary | ICD-10-CM | POA: Diagnosis present

## 2016-08-11 DIAGNOSIS — R0609 Other forms of dyspnea: Secondary | ICD-10-CM | POA: Diagnosis not present

## 2016-08-11 DIAGNOSIS — D649 Anemia, unspecified: Secondary | ICD-10-CM | POA: Diagnosis not present

## 2016-08-11 DIAGNOSIS — M7989 Other specified soft tissue disorders: Secondary | ICD-10-CM | POA: Diagnosis not present

## 2016-08-11 DIAGNOSIS — R591 Generalized enlarged lymph nodes: Secondary | ICD-10-CM | POA: Diagnosis not present

## 2016-08-11 DIAGNOSIS — T39395A Adverse effect of other nonsteroidal anti-inflammatory drugs [NSAID], initial encounter: Secondary | ICD-10-CM | POA: Diagnosis present

## 2016-08-11 DIAGNOSIS — N179 Acute kidney failure, unspecified: Secondary | ICD-10-CM | POA: Diagnosis present

## 2016-08-11 DIAGNOSIS — D62 Acute posthemorrhagic anemia: Secondary | ICD-10-CM | POA: Diagnosis present

## 2016-08-11 DIAGNOSIS — I2510 Atherosclerotic heart disease of native coronary artery without angina pectoris: Secondary | ICD-10-CM | POA: Diagnosis not present

## 2016-08-11 DIAGNOSIS — K5900 Constipation, unspecified: Secondary | ICD-10-CM | POA: Diagnosis present

## 2016-08-11 DIAGNOSIS — K279 Peptic ulcer, site unspecified, unspecified as acute or chronic, without hemorrhage or perforation: Secondary | ICD-10-CM | POA: Diagnosis not present

## 2016-08-11 DIAGNOSIS — K922 Gastrointestinal hemorrhage, unspecified: Secondary | ICD-10-CM | POA: Diagnosis not present

## 2016-08-11 DIAGNOSIS — K210 Gastro-esophageal reflux disease with esophagitis: Secondary | ICD-10-CM | POA: Diagnosis present

## 2016-08-11 DIAGNOSIS — K209 Esophagitis, unspecified: Secondary | ICD-10-CM | POA: Diagnosis not present

## 2016-08-11 DIAGNOSIS — J45909 Unspecified asthma, uncomplicated: Secondary | ICD-10-CM | POA: Diagnosis present

## 2016-08-11 DIAGNOSIS — K254 Chronic or unspecified gastric ulcer with hemorrhage: Secondary | ICD-10-CM | POA: Diagnosis present

## 2016-08-11 DIAGNOSIS — I10 Essential (primary) hypertension: Secondary | ICD-10-CM | POA: Diagnosis not present

## 2016-08-11 DIAGNOSIS — R0602 Shortness of breath: Secondary | ICD-10-CM | POA: Diagnosis present

## 2016-08-11 DIAGNOSIS — Z791 Long term (current) use of non-steroidal anti-inflammatories (NSAID): Secondary | ICD-10-CM | POA: Diagnosis not present

## 2016-08-11 DIAGNOSIS — R600 Localized edema: Secondary | ICD-10-CM | POA: Diagnosis not present

## 2016-08-11 DIAGNOSIS — Z7951 Long term (current) use of inhaled steroids: Secondary | ICD-10-CM | POA: Diagnosis not present

## 2016-08-11 DIAGNOSIS — Z6833 Body mass index (BMI) 33.0-33.9, adult: Secondary | ICD-10-CM | POA: Diagnosis not present

## 2016-08-11 DIAGNOSIS — Z87891 Personal history of nicotine dependence: Secondary | ICD-10-CM | POA: Diagnosis not present

## 2016-08-11 DIAGNOSIS — I959 Hypotension, unspecified: Secondary | ICD-10-CM | POA: Diagnosis not present

## 2016-08-11 DIAGNOSIS — I420 Dilated cardiomyopathy: Secondary | ICD-10-CM | POA: Diagnosis present

## 2016-08-11 DIAGNOSIS — I509 Heart failure, unspecified: Secondary | ICD-10-CM | POA: Diagnosis not present

## 2016-08-11 DIAGNOSIS — I272 Pulmonary hypertension, unspecified: Secondary | ICD-10-CM | POA: Diagnosis not present

## 2016-08-11 DIAGNOSIS — R599 Enlarged lymph nodes, unspecified: Secondary | ICD-10-CM | POA: Diagnosis present

## 2016-08-11 DIAGNOSIS — I110 Hypertensive heart disease with heart failure: Secondary | ICD-10-CM | POA: Diagnosis present

## 2016-08-11 HISTORY — PX: ESOPHAGOGASTRODUODENOSCOPY (EGD) WITH PROPOFOL: SHX5813

## 2016-08-11 HISTORY — DX: Acute combined systolic (congestive) and diastolic (congestive) heart failure: I50.41

## 2016-08-11 LAB — BPAM RBC
Blood Product Expiration Date: 201803092359
Blood Product Expiration Date: 201803222359
ISSUE DATE / TIME: 201803021806
ISSUE DATE / TIME: 201803022104
UNIT TYPE AND RH: 7300
Unit Type and Rh: 7300

## 2016-08-11 LAB — TYPE AND SCREEN
ABO/RH(D): B POS
ANTIBODY SCREEN: NEGATIVE
UNIT DIVISION: 0
Unit division: 0

## 2016-08-11 LAB — COMPREHENSIVE METABOLIC PANEL
ALK PHOS: 51 U/L (ref 38–126)
ALT: 9 U/L — ABNORMAL LOW (ref 17–63)
ANION GAP: 8 (ref 5–15)
AST: 15 U/L (ref 15–41)
Albumin: 3.2 g/dL — ABNORMAL LOW (ref 3.5–5.0)
BUN: 14 mg/dL (ref 6–20)
CALCIUM: 8.9 mg/dL (ref 8.9–10.3)
CHLORIDE: 104 mmol/L (ref 101–111)
CO2: 26 mmol/L (ref 22–32)
CREATININE: 1.12 mg/dL (ref 0.61–1.24)
Glucose, Bld: 107 mg/dL — ABNORMAL HIGH (ref 65–99)
Potassium: 4.5 mmol/L (ref 3.5–5.1)
SODIUM: 138 mmol/L (ref 135–145)
Total Bilirubin: 1.8 mg/dL — ABNORMAL HIGH (ref 0.3–1.2)
Total Protein: 5.7 g/dL — ABNORMAL LOW (ref 6.5–8.1)

## 2016-08-11 LAB — ECHOCARDIOGRAM COMPLETE
Height: 67 in
WEIGHTICAEL: 3728 [oz_av]

## 2016-08-11 LAB — CBC
HCT: 31.8 % — ABNORMAL LOW (ref 39.0–52.0)
HEMOGLOBIN: 9.3 g/dL — AB (ref 13.0–17.0)
MCH: 22.4 pg — AB (ref 26.0–34.0)
MCHC: 29.2 g/dL — ABNORMAL LOW (ref 30.0–36.0)
MCV: 76.4 fL — ABNORMAL LOW (ref 78.0–100.0)
PLATELETS: 410 10*3/uL — AB (ref 150–400)
RBC: 4.16 MIL/uL — AB (ref 4.22–5.81)
RDW: 18.2 % — ABNORMAL HIGH (ref 11.5–15.5)
WBC: 9.8 10*3/uL (ref 4.0–10.5)

## 2016-08-11 LAB — MAGNESIUM: Magnesium: 2.6 mg/dL — ABNORMAL HIGH (ref 1.7–2.4)

## 2016-08-11 SURGERY — ESOPHAGOGASTRODUODENOSCOPY (EGD) WITH PROPOFOL
Anesthesia: Monitor Anesthesia Care | Laterality: Left

## 2016-08-11 MED ORDER — PANTOPRAZOLE SODIUM 40 MG PO TBEC
40.0000 mg | DELAYED_RELEASE_TABLET | Freq: Two times a day (BID) | ORAL | Status: DC
  Administered 2016-08-11 – 2016-08-17 (×11): 40 mg via ORAL
  Filled 2016-08-11 (×11): qty 1

## 2016-08-11 MED ORDER — PROPOFOL 500 MG/50ML IV EMUL
INTRAVENOUS | Status: DC | PRN
  Administered 2016-08-11: 150 ug/kg/min via INTRAVENOUS

## 2016-08-11 MED ORDER — LISINOPRIL 5 MG PO TABS
5.0000 mg | ORAL_TABLET | Freq: Every day | ORAL | Status: DC
  Administered 2016-08-11 – 2016-08-12 (×2): 5 mg via ORAL
  Filled 2016-08-11 (×2): qty 1

## 2016-08-11 MED ORDER — FUROSEMIDE 10 MG/ML IJ SOLN
40.0000 mg | Freq: Two times a day (BID) | INTRAMUSCULAR | Status: DC
  Administered 2016-08-11 – 2016-08-12 (×3): 40 mg via INTRAVENOUS
  Filled 2016-08-11 (×3): qty 4

## 2016-08-11 MED ORDER — PERFLUTREN LIPID MICROSPHERE
1.0000 mL | INTRAVENOUS | Status: AC | PRN
  Administered 2016-08-11: 3 mL via INTRAVENOUS
  Filled 2016-08-11: qty 10

## 2016-08-11 MED ORDER — CARVEDILOL 3.125 MG PO TABS
3.1250 mg | ORAL_TABLET | Freq: Two times a day (BID) | ORAL | Status: DC
  Administered 2016-08-11 – 2016-08-12 (×2): 3.125 mg via ORAL
  Filled 2016-08-11 (×2): qty 1

## 2016-08-11 NOTE — Anesthesia Procedure Notes (Signed)
Procedure Name: MAC Date/Time: 08/11/2016 11:25 AM Performed by: Kyung Rudd Pre-anesthesia Checklist: Patient identified, Emergency Drugs available, Suction available and Patient being monitored Patient Re-evaluated:Patient Re-evaluated prior to inductionOxygen Delivery Method: Nasal cannula Intubation Type: IV induction Placement Confirmation: positive ETCO2 and breath sounds checked- equal and bilateral

## 2016-08-11 NOTE — Progress Notes (Addendum)
[facility] Progress Note  Patient: Kristopher [name] TLX:[id number]   PCP: Frederich Chick, MD DOB: [birth year]   DOA: 08/10/2016   DOS: 08/11/2016   Date of Service: the patient was seen and examined on 08/11/2016   Subjective: Feeling better but continues to have mild shortness of breath. No nausea no vomiting. No further episodes of bowel movement with blood.  Brief hospital course: Pt. 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 Currently further plan is further workup of systolic dysfunction.  Assessment and Plan: 1. Acute upper GI bleed S/P EGD, 2 gastric ulcers identified as well as esophagitis. Continue PPI. Avoid NSAIDs. Monitor CBC tomorrow. Advance diet.  2. Dyspnea on exertion. Acute combined diastolic and systolic CHF. Pulmonary hypertension based on echocardiogram. RV dysfunction.  Pedal edema Patient presents with complaints of shortness of breath on exertion, echogram shows EF of 15% with diffuse hypokinesis as well as cardiomegaly. No prior echocardiogram to compare. We will consult cardiology for further input. Continue IV Lasix 40 mg every 12 hours. Would also start him on Coreg 3.125 mg twice a day. And lisinopril 5 mg daily. Check LDL tomorrow.  3. Chronic LBBB. No evidence of ACS. Continue to monitor.  Bowel regimen: last BM 08/10/2016 Diet: Diet DVT Prophylaxis: mechanical compression device.  Advance goals of care discussion: full code  Family Communication: no family was present at bedside, at the time of interview.  Disposition:  Discharge to home. Expected discharge date: 08/12/2016 or 08/13/2016, depending on cardiology workup.  Consultants: gastroenterology, cardiology  Procedures: Echocardiogram  Antibiotics: Anti-infectives    None        Objective: Physical  Exam: Vitals:   08/11/16 0939 08/11/16 1100 08/11/16 1147 08/11/16 1203  BP:  (!) 154/97 120/78 122/78  Pulse:  (!) 103 (!) 101 100  Resp:  (!) 26 (!) 34 (!) 25  Temp:  98.8 F (37.1 C) 98 F (36.7 C) 98.2 F (36.8 C)  TempSrc:  Oral    SpO2: 95% 94% 98% 98%  Weight:      Height:        Intake/Output Summary (Last 24 hours) at 08/11/16 1613 Last data filed at 08/11/16 1145  Gross per 24 hour  Intake             1105 ml  Output              450 ml  Net              655 ml   Filed Weights   08/10/16 1415 08/10/16 2016 08/11/16 0051  Weight: 105.2 kg (232 lb) 102.2 kg (225 lb 6.4 oz) 105.7 kg (233 lb)    General: Alert, Awake and Oriented to Time, Place and Person. Appear in mild distress, affect appropriate Eyes: PERRL, Conjunctiva normal ENT: Oral Mucosa clear moist. Neck: difficult to assess JVD, no Abnormal Mass Or lumps Cardiovascular: S1 and S2 Present, no Murmur, Respiratory: Bilateral Air entry equal and Decreased, no use of accessory muscle, Clear to Auscultation, no Crackles, no wheezes Abdomen: Bowel Sound present, Soft and no tenderness Skin: no redness, no Rash, no induration Extremities: bilateral Pedal edema, no calf tenderness Neurologic: Grossly no focal neuro deficit. Bilaterally Equal motor strength  Data Reviewed: CBC:  Recent Labs Lab 08/10/16 1418 08/11/16 0443  WBC 9.1 9.8  HGB 7.5* 9.3*  HCT 26.7* 31.8*  MCV 73.4* 76.4*  PLT 452* 410*   Basic Metabolic Panel:  Recent Labs Lab 08/10/16 1418 08/11/16 0443  NA 139 138  K 4.0 4.5  CL 104 104  CO2 26 26  GLUCOSE 116* 107*  BUN 20 14  CREATININE 1.20 1.12  CALCIUM 8.8* 8.9  MG  --  2.6*    Liver Function Tests:  Recent Labs Lab 08/11/16 0443  AST 15  ALT 9*  ALKPHOS 51  BILITOT 1.8*  PROT 5.7*  ALBUMIN 3.2*   No results for input(s): LIPASE, AMYLASE in the last 168 hours. No results for input(s): AMMONIA in the last 168 hours. Coagulation Profile:  Recent Labs Lab  08/10/16 1647  INR 1.24   Cardiac Enzymes: No results for input(s): CKTOTAL, CKMB, CKMBINDEX, TROPONINI in the last 168 hours. BNP (last 3 results) No results for input(s): PROBNP in the last 8760 hours.  CBG: No results for input(s): GLUCAP in the last 168 hours.  Studies: No results found.   Scheduled Meds: . furosemide  40 mg Intravenous Q12H  . guaiFENesin  600 mg Oral BID  . ipratropium-albuterol  3 mL Nebulization BID  . mometasone-formoterol  2 puff Inhalation BID  . pantoprazole  40 mg Oral BID AC  . sodium chloride flush  3 mL Intravenous Q12H   Continuous Infusions: PRN Meds: acetaminophen **OR** acetaminophen, cyclobenzaprine, ondansetron **OR** ondansetron (ZOFRAN) IV, polyethylene glycol  Time spent: 30 minutes  Author: Lynden Oxford, MD [facility] Pager: [phone] 08/11/2016 4:13 PM  If 7PM-7AM, please contact night-coverage at [email or web address], password [facility]

## 2016-08-11 NOTE — Op Note (Signed)
[facility] Patient Name: Kristopher [name] Procedure Date : 08/11/2016 MRN: [id number] Attending MD: Willis Modena , MD Date of Birth: 21-Apr-1951 CSN: [id number] Age: 66 Admit Type: Inpatient Procedure:                Upper GI endoscopy Indications:              Acute post hemorrhagic anemia, Melena Providers:                Willis Modena, MD, Will Bonnet RN, RN,                            Kandice Robinsons, Technician Referring MD:              Medicines:                Monitored Anesthesia Care Complications:            No immediate complications. Estimated Blood Loss:     Estimated blood loss: none. Procedure:                Pre-Anesthesia Assessment:                           - Prior to the procedure, a History and Physical                            was performed, and patient medications and                            allergies were reviewed. The patient's tolerance of                            previous anesthesia was also reviewed. The risks                            and benefits of the procedure and the sedation                            options and risks were discussed with the patient.                            All questions were answered, and informed consent                            was obtained. Prior Anticoagulants: The patient has                            taken previous NSAID medication. ASA Grade                            Assessment: III - A patient with severe systemic                            disease. After reviewing the risks and benefits,  the patient was deemed in satisfactory condition to                            undergo the procedure.                           After obtaining informed consent, the endoscope was                            passed under direct vision. Throughout the                            procedure, the patient's blood pressure, pulse, and                            oxygen saturations were  monitored continuously. The                            EG-[id number] ([id number]) scope was introduced through the                            mouth, and advanced to the second part of duodenum.                            The upper GI endoscopy was accomplished without                            difficulty. The patient tolerated the procedure                            well. Scope In: Scope Out: Findings:      A medium-sized hiatal hernia was present.      LA Grade D (one or more mucosal breaks involving at least 75% of       esophageal circumference) esophagitis with no bleeding was found.      The exam of the esophagus was otherwise normal.      Two non-bleeding cratered gastric ulcers with no stigmata of bleeding       were found in the prepyloric region of the stomach. The largest lesion       was 10 mm in largest dimension.      The exam of the stomach was otherwise normal.      The duodenal bulb, first portion of the duodenum and second portion of       the duodenum were normal.      No old or fresh blood was seen to the extent of our examination. Impression:               - Medium-sized hiatal hernia.                           - LA Grade D reflux esophagitis.                           - Non-bleeding gastric ulcers with no stigmata of  bleeding. No high-risk stigmata for bleeding were                            identified. Likely NSAID-mediated. Highly likely                            source of patient's recent melena and anemia.                           - Normal duodenal bulb, first portion of the                            duodenum and second portion of the duodenum. Moderate Sedation:      None Recommendation:           - Return patient to hospital ward for ongoing care.                           - Mechanical soft diet today.                           - Oral PPI.                           - No aspirin, ibuprofen, naproxen, or other                             non-steroidal anti-inflammatory drugs indefinitely.                           - Perform an H. pylori serology at appointment to                            be scheduled.                           - Will need repeat endoscopy in 3 months as                            outpatient; also is in need for routine screening                            colonoscopy.                           - If Hgb stabilizes and no further bleeding,                            patient can likely be discharged home tomorrow from                            GI perspective.                           [facility] will follow. Procedure Code(s):        --- Professional ---  16109, Esophagogastroduodenoscopy, flexible,                            transoral; diagnostic, including collection of                            specimen(s) by brushing or washing, when performed                            (separate procedure) Diagnosis Code(s):        --- Professional ---                           K44.9, Diaphragmatic hernia without obstruction or                            gangrene                           K21.0, Gastro-esophageal reflux disease with                            esophagitis                           K25.9, Gastric ulcer, unspecified as acute or                            chronic, without hemorrhage or perforation                           D62, Acute posthemorrhagic anemia                           K92.1, Melena (includes Hematochezia) CPT copyright 2016 American Medical Association. All rights reserved. The codes documented in this report are preliminary and upon coder review may  be revised to meet current compliance requirements. Willis Modena, MD 08/11/2016 11:52:41 AM This report has been signed electronically. Number of Addenda: 0

## 2016-08-11 NOTE — Consult Note (Signed)
[facility] Consultation Note  Referring Provider: Dr. Lynden Oxford [facility]) Primary Care Physician:  Frederich Chick, MD Primary Gastroenterologist:  None  Reason for Consultation:  GI bleeding  HPI: Kristopher [name] is a 66 y.o. male whom I've been asked to see for GI bleeding.  Patient has had melenic stools for the past several days, accompanied by dizziness and weakness.  No abdominal pain or hematemesis.  Constipation over the past few weeks.  No weight loss.  No prior endoscopy or colonoscopy.  Has been taking Alleve over the past few weeks, last dose couple weeks ago.     Past Medical History:  Diagnosis Date  . Asthma     History reviewed. No pertinent surgical history.  Prior to Admission medications   Medication Sig Start Date End Date Taking? Authorizing Provider  albuterol (PROVENTIL HFA;VENTOLIN HFA) 108 (90 Base) MCG/ACT inhaler Inhale 2 puffs into the lungs every 6 (six) hours as needed for wheezing or shortness of breath.   Yes Historical Provider, MD  budesonide-formoterol (SYMBICORT) 80-4.5 MCG/ACT inhaler Inhale 1 puff into the lungs daily.   Yes Historical Provider, MD  cyclobenzaprine (FLEXERIL) 10 MG tablet Take 10 mg by mouth 3 (three) times daily as needed for muscle spasms.   Yes Historical Provider, MD  meloxicam (MOBIC) 15 MG tablet Take 15 mg by mouth daily.   Yes Historical Provider, MD    Current Facility-Administered Medications  Medication Dose Route Frequency Provider Last Rate Last Dose  . 0.9 %  sodium chloride infusion   Intravenous Continuous Willis Modena, MD   Stopped at 08/11/16 0300  . [MAR Hold] acetaminophen (TYLENOL) tablet 650 mg  650 mg Oral Q6H PRN Rolly Salter, MD       Or  . Mitzi Hansen Hold] acetaminophen (TYLENOL) suppository 650 mg  650 mg Rectal Q6H PRN Rolly Salter, MD      . Mitzi Hansen Hold] cyclobenzaprine (FLEXERIL) tablet 10 mg  10 mg Oral TID PRN Rolly Salter, MD      . Mitzi Hansen Hold] furosemide (LASIX) injection 40 mg  40 mg  Intravenous Daily Rolly Salter, MD      . Mitzi Hansen Hold] guaiFENesin [facility]) 12 hr tablet 600 mg  600 mg Oral BID Rolly Salter, MD   600 mg at 08/10/16 2129  . [MAR Hold] ipratropium-albuterol (DUONEB) 0.5-2.5 (3) MG/3ML nebulizer solution 3 mL  3 mL Nebulization BID Rolly Salter, MD   3 mL at 08/11/16 0936  . [MAR Hold] mometasone-formoterol (DULERA) 100-5 MCG/ACT inhaler 2 puff  2 puff Inhalation BID Rolly Salter, MD      . Mitzi Hansen Hold] ondansetron [facility]) tablet 4 mg  4 mg Oral Q6H PRN Rolly Salter, MD       Or  . Mitzi Hansen Hold] ondansetron [facility]) injection 4 mg  4 mg Intravenous Q6H PRN Rolly Salter, MD      . Mitzi Hansen Hold] pantoprazole (PROTONIX) injection 40 mg  40 mg Intravenous Q12H Rolly Salter, MD   40 mg at 08/10/16 2129  . [MAR Hold] perflutren lipid microspheres (DEFINITY) IV suspension  1-10 mL Intravenous PRN Rolly Salter, MD   3 mL at 08/11/16 1033  . [MAR Hold] polyethylene glycol (MIRALAX / GLYCOLAX) packet 17 g  17 g Oral Daily PRN Rolly Salter, MD      . Mitzi Hansen Hold] sodium chloride flush (NS) 0.9 % injection 3 mL  3 mL Intravenous Q12H Rolly Salter, MD  3 mL at 08/10/16 2137    Allergies as of 08/10/2016 - Review Complete 08/10/2016  Allergen Reaction Noted  . Penicillins  02/10/2016    Family History  Problem Relation Age of Onset  . Hypertension Mother     Social History   Social History  . Marital status: Single    Spouse name: N/A  . Number of children: N/A  . Years of education: N/A   Occupational History  . Not on file.   Social History Main Topics  . Smoking status: Former Games developer  . Smokeless tobacco: Never Used  . Alcohol use No  . Drug use: No  . Sexual activity: Not on file   Other Topics Concern  . Not on file   Social History Narrative  . No narrative on file    Review of Systems: Positive = bold Gen: Denies any fever, chills, rigors, night sweats, anorexia, fatigue, weakness, malaise, involuntary weight loss, and sleep  disorder CV: Denies chest pain, angina, palpitations, syncope, orthopnea, PND, peripheral edema, and claudication. Resp: Denies dyspnea, cough, sputum, wheezing, coughing up blood. GI: Described in detail in HPI.    GU : Denies urinary burning, blood in urine, urinary frequency, urinary hesitancy, nocturnal urination, and urinary incontinence. MS: Denies joint pain or swelling.  Denies muscle weakness, cramps, atrophy.  Derm: Denies rash, itching, oral ulcerations, hives, unhealing ulcers.  Psych: Denies depression, anxiety, memory loss, suicidal ideation, hallucinations,  and confusion. Heme: Denies bruising, bleeding, and enlarged lymph nodes. Neuro:  Denies any headaches, dizziness, paresthesias. Endo:  Denies any problems with DM, thyroid, adrenal function.  Physical Exam: Vital signs in last 24 hours: Temp:  [97.8 F (36.6 C)-99.3 F (37.4 C)] 98.8 F (37.1 C) (03/03 1100) Pulse Rate:  [65-119] 103 (03/03 1100) Resp:  [18-35] 26 (03/03 1100) BP: (122-154)/(49-97) 154/97 (03/03 1100) SpO2:  [94 %-100 %] 94 % (03/03 1100) Weight:  [102.2 kg (225 lb 6.4 oz)-105.7 kg (233 lb)] 105.7 kg (233 lb) (03/03 0051) Last BM Date: 08/11/16 General:   Alert, overweight, Well-developed, well-nourished, pleasant and cooperative in NAD Head:  Normocephalic and atraumatic. Eyes:  Sclera clear, no icterus.   Conjunctiva pale Ears:  Normal auditory acuity. Nose:  No deformity, discharge,  or lesions. Mouth:  No deformity or lesions.  Oropharynx pale and dry Neck:  Supple; no masses or thyromegaly. Lungs:  Clear throughout to auscultation.   No wheezes, crackles, or rhonchi. No acute distress. Heart:  Regular rate and rhythm; no murmurs, clicks, rubs,  or gallops. Abdomen:  Soft, nontender and nondistended. No masses, hepatosplenomegaly or hernias noted. Normal bowel sounds, without guarding, and without rebound.     Msk:  Symmetrical without gross deformities. Normal posture. Pulses:  Normal  pulses noted. Extremities:  Without clubbing or edema. Neurologic:  Alert and  oriented x4; diffusely weak, otherwise grossly normal neurologically. Skin:  Intact without significant lesions or rashes. Psych:  Alert and cooperative. Normal mood and affect.   Lab Results:  Recent Labs  08/10/16 1418 08/11/16 0443  WBC 9.1 9.8  HGB 7.5* 9.3*  HCT 26.7* 31.8*  PLT 452* 410*   BMET  Recent Labs  08/10/16 1418 08/11/16 0443  NA 139 138  K 4.0 4.5  CL 104 104  CO2 26 26  GLUCOSE 116* 107*  BUN 20 14  CREATININE 1.20 1.12  CALCIUM 8.8* 8.9   LFT  Recent Labs  08/11/16 0443  PROT 5.7*  ALBUMIN 3.2*  AST 15  ALT 9*  ALKPHOS 51  BILITOT 1.8*   PT/INR  Recent Labs  08/10/16 1647  LABPROT 15.6*  INR 1.24    Studies/Results: Dg Chest 2 View  Result Date: 08/10/2016 CLINICAL DATA:  Cough and short of breath EXAM: CHEST  2 VIEW COMPARISON:  07/12/2010 FINDINGS: Cardiac enlargement. Negative for heart failure. Lungs are clear without infiltrate effusion or mass. Degenerative changes in the shoulder joints bilaterally. IMPRESSION: No active cardiopulmonary disease. Electronically Signed   By: Marlan Palau M.D.   On: 08/10/2016 14:40    Impression:  1.  Melena.  Suspect peptic ulcer. 2.  Acute blood loss anemia.  Plan:  1.  PPI. 2.  NPO. 3.  EGD today. 4.  Risks (bleeding, infection, bowel perforation that could require surgery, sedation-related changes in cardiopulmonary systems), benefits (identification and possible treatment of source of symptoms, exclusion of certain causes of symptoms), and alternatives (watchful waiting, radiographic imaging studies, empiric medical treatment) of upper endoscopy (EGD) were explained to patient/family in detail and patient wishes to proceed. 5.  If endoscopy unrevealing, consider colonoscopy.   LOS: 0 days   Kebra Lowrimore M  08/11/2016, 11:21 AM  Pager [phone] If no answer or after 5 PM call [phone]

## 2016-08-11 NOTE — Anesthesia Preprocedure Evaluation (Addendum)
Anesthesia Evaluation  Patient identified by MRN, date of birth, ID band Patient awake    Reviewed: Allergy & Precautions, NPO status , Patient's Chart, lab work & pertinent test results  History of Anesthesia Complications Negative for: history of anesthetic complications  Airway Mallampati: I  TM Distance: >3 FB Neck ROM: Full    Dental  (+) Poor Dentition, Edentulous Upper   Pulmonary asthma , former smoker,    breath sounds clear to auscultation       Cardiovascular hypertension, Pt. on medications + DOE  + dysrhythmias  Rhythm:Regular     Neuro/Psych negative neurological ROS  negative psych ROS   GI/Hepatic Neg liver ROS, Possible gi bleed   Endo/Other  Morbid obesity  Renal/GU negative Renal ROS     Musculoskeletal   Abdominal   Peds  Hematology  (+) anemia ,   Anesthesia Other Findings   Reproductive/Obstetrics                            Anesthesia Physical Anesthesia Plan  ASA: III  Anesthesia Plan: MAC   Post-op Pain Management:    Induction: Intravenous  Airway Management Planned: Nasal Cannula  Additional Equipment: None  Intra-op Plan:   Post-operative Plan:   Informed Consent:   Dental advisory given  Plan Discussed with: CRNA and Anesthesiologist  Anesthesia Plan Comments:        Anesthesia Quick Evaluation

## 2016-08-11 NOTE — Anesthesia Postprocedure Evaluation (Addendum)
Anesthesia Post Note  Patient: Kristopher [name]  Procedure(s) Performed: Procedure(s) (LRB): ESOPHAGOGASTRODUODENOSCOPY (EGD) WITH PROPOFOL (Left)  Patient location during evaluation: PACU Anesthesia Type: MAC Level of consciousness: awake and alert Pain management: pain level controlled Vital Signs Assessment: post-procedure vital signs reviewed and stable Respiratory status: spontaneous breathing, nonlabored ventilation, respiratory function stable and patient connected to nasal cannula oxygen Cardiovascular status: stable and blood pressure returned to baseline Anesthetic complications: no       Last Vitals:  Vitals:   08/11/16 1147 08/11/16 1203  BP: 120/78 122/78  Pulse: (!) 101 100  Resp: (!) 34 (!) 25  Temp: 36.7 C     Last Pain:  Vitals:   08/11/16 1100  TempSrc: Oral  PainSc:                  Dacey Milberger

## 2016-08-11 NOTE — Interval H&P Note (Signed)
History and Physical Interval Note:  08/11/2016 11:17 AM  Kristopher [name]  has presented today for surgery, with the diagnosis of melena, anemia  The various methods of treatment have been discussed with the patient and family. After consideration of risks, benefits and other options for treatment, the patient has consented to  Procedure(s): ESOPHAGOGASTRODUODENOSCOPY (EGD) WITH PROPOFOL (Left) as a surgical intervention .  The patient's history has been reviewed, patient examined, no change in status, stable for surgery.  I have reviewed the patient's chart and labs.  Questions were answered to the patient's satisfaction.     [name]

## 2016-08-11 NOTE — Transfer of Care (Signed)
Immediate Anesthesia Transfer of Care Note  Patient: Kristopher [name]  Procedure(s) Performed: Procedure(s): ESOPHAGOGASTRODUODENOSCOPY (EGD) WITH PROPOFOL (Left)  Patient Location: PACU  Anesthesia Type:MAC  Level of Consciousness: awake, alert  and oriented  Airway & Oxygen Therapy: Patient Spontanous Breathing and Patient connected to nasal cannula oxygen  Post-op Assessment: Report given to RN, Post -op Vital signs reviewed and stable and Patient moving all extremities X 4  Post vital signs: Reviewed and stable  Last Vitals:  Vitals:   08/11/16 1100 08/11/16 1147  BP: (!) 154/97 120/78  Pulse: (!) 103 (!) 101  Resp: (!) 26 (!) 34  Temp: 37.1 C 36.7 C    Last Pain:  Vitals:   08/11/16 1100  TempSrc: Oral  PainSc:          Complications: No apparent anesthesia complications

## 2016-08-11 NOTE — Progress Notes (Signed)
  Echocardiogram 2D Echocardiogram has been performed with definity.  Kristopher [name] 08/11/2016, 10:44 AM

## 2016-08-12 ENCOUNTER — Inpatient Hospital Stay (HOSPITAL_COMMUNITY): Payer: BC Managed Care – PPO

## 2016-08-12 ENCOUNTER — Encounter (HOSPITAL_COMMUNITY): Payer: Self-pay | Admitting: Physician Assistant

## 2016-08-12 DIAGNOSIS — I10 Essential (primary) hypertension: Secondary | ICD-10-CM | POA: Diagnosis present

## 2016-08-12 DIAGNOSIS — K922 Gastrointestinal hemorrhage, unspecified: Secondary | ICD-10-CM

## 2016-08-12 DIAGNOSIS — I509 Heart failure, unspecified: Secondary | ICD-10-CM

## 2016-08-12 DIAGNOSIS — I272 Pulmonary hypertension, unspecified: Secondary | ICD-10-CM

## 2016-08-12 LAB — CBC
HEMATOCRIT: 36.9 % — AB (ref 39.0–52.0)
HEMOGLOBIN: 10.7 g/dL — AB (ref 13.0–17.0)
MCH: 22.5 pg — AB (ref 26.0–34.0)
MCHC: 29 g/dL — ABNORMAL LOW (ref 30.0–36.0)
MCV: 77.7 fL — AB (ref 78.0–100.0)
Platelets: 454 10*3/uL — ABNORMAL HIGH (ref 150–400)
RBC: 4.75 MIL/uL (ref 4.22–5.81)
RDW: 18 % — ABNORMAL HIGH (ref 11.5–15.5)
WBC: 8.5 10*3/uL (ref 4.0–10.5)

## 2016-08-12 LAB — LIPID PANEL
CHOL/HDL RATIO: 3.7 ratio
CHOLESTEROL: 143 mg/dL (ref 0–200)
HDL: 39 mg/dL — AB (ref >40)
LDL Cholesterol: 89 mg/dL (ref 0–99)
Triglycerides: 73 mg/dL (ref <150)
VLDL: 15 mg/dL (ref 0–40)

## 2016-08-12 LAB — BASIC METABOLIC PANEL
ANION GAP: 8 (ref 5–15)
BUN: 15 mg/dL (ref 6–20)
CHLORIDE: 100 mmol/L — AB (ref 101–111)
CO2: 30 mmol/L (ref 22–32)
Calcium: 9 mg/dL (ref 8.9–10.3)
Creatinine, Ser: 1.28 mg/dL — ABNORMAL HIGH (ref 0.61–1.24)
GFR calc Af Amer: >60 mL/min (ref >60)
GFR, EST NON AFRICAN AMERICAN: 57 mL/min — AB (ref >60)
Glucose, Bld: 135 mg/dL — ABNORMAL HIGH (ref 65–99)
POTASSIUM: 3.8 mmol/L (ref 3.5–5.1)
Sodium: 138 mmol/L (ref 135–145)

## 2016-08-12 LAB — MAGNESIUM: Magnesium: 2.3 mg/dL (ref 1.7–2.4)

## 2016-08-12 MED ORDER — LISINOPRIL 10 MG PO TABS
10.0000 mg | ORAL_TABLET | Freq: Every day | ORAL | Status: DC
  Administered 2016-08-13: 10 mg via ORAL
  Filled 2016-08-12: qty 1

## 2016-08-12 MED ORDER — IOPAMIDOL (ISOVUE-370) INJECTION 76%
INTRAVENOUS | Status: AC
  Administered 2016-08-12: 80 mL
  Filled 2016-08-12: qty 100

## 2016-08-12 MED ORDER — CARVEDILOL 6.25 MG PO TABS
6.2500 mg | ORAL_TABLET | Freq: Two times a day (BID) | ORAL | Status: DC
  Administered 2016-08-12 – 2016-08-13 (×2): 6.25 mg via ORAL
  Filled 2016-08-12 (×2): qty 1

## 2016-08-12 MED ORDER — IPRATROPIUM-ALBUTEROL 0.5-2.5 (3) MG/3ML IN SOLN
3.0000 mL | Freq: Four times a day (QID) | RESPIRATORY_TRACT | Status: DC | PRN
Start: 2016-08-12 — End: 2016-08-17

## 2016-08-12 NOTE — Progress Notes (Signed)
[facility] Progress Note  Patient: Kristopher [name] WUJ:[id number]   PCP: Frederich Chick, MD DOB: 1951-01-05   DOA: 08/10/2016   DOS: 08/12/2016   Date of Service: the patient was seen and examined on 08/12/2016   Subjective: Feeling better, still has some shortness of breath. No nausea no vomiting. No further episodes of bowel movement with blood.  Brief hospital course: Pt. 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 EGD shows gastric ulcer, Hb remained stable Currently further plan is further workup of systolic dysfunction.  Assessment and Plan: 1. Acute upper GI bleed S/P EGD, 2 gastric ulcers identified as well as esophagitis. Continue PPI. Avoid NSAIDs. Monitor CBC Gastroenterology signed off  2. Dyspnea on exertion. Acute combined diastolic and systolic CHF. Pulmonary hypertension based on echocardiogram. RV dysfunction. Pedal edema  Patient presents with complaints of shortness of breath on exertion, echogram shows EF of 15% with diffuse hypokinesis as well as cardiomegaly. No prior echocardiogram to compare. Consulted cardiology for further input. Continue IV Lasix 40 mg every 12 hours. Stared him on Coreg 3.125 mg twice a day. And lisinopril 5 mg daily. Dose increased by cardiology today. LDL 89. Due to tachycardia and pedal edema cardiology ordered a CTPE protocol, will follow, also check lower extremity doppler   3. Chronic LBBB. No evidence of ACS. Continue to monitor. No ischemic work up Recommended  Bowel regimen: last BM 08/10/2016 Diet: cardiac diet DVT Prophylaxis: mechanical compression device.  Advance goals of care discussion: full code  Family Communication: no family was present at bedside, at the time of interview.  Disposition:  Discharge to home. Expected discharge date:  08/13/2016, depending on cardiology workup.  Consultants: gastroenterology, cardiology  Procedures: Echocardiogram  Antibiotics: Anti-infectives    None      Objective: Physical Exam: Vitals:   08/12/16 0216 08/12/16 0452 08/12/16 0754 08/12/16 0845  BP:  129/69  118/74  Pulse:  (!) 101  90  Resp:  20    Temp:  98.2 F (36.8 C)    TempSrc:      SpO2:  96% 96%   Weight: 106.6 kg (235 lb)     Height:        Intake/Output Summary (Last 24 hours) at 08/12/16 1429 Last data filed at 08/12/16 0211  Gross per 24 hour  Intake                0 ml  Output              200 ml  Net             -200 ml   Filed Weights   08/10/16 2016 08/11/16 0051 08/12/16 0216  Weight: 102.2 kg (225 lb 6.4 oz) 105.7 kg (233 lb) 106.6 kg (235 lb)   General: Alert, Awake and Oriented to Time, Place and Person. Appear in mild distress, affect appropriate Eyes: PERRL, Conjunctiva normal ENT: Oral Mucosa clear moist. Neck: difficult to assess JVD, no Abnormal Mass Or lumps Cardiovascular: S1 and S2 Present, no Murmur, Respiratory: Bilateral Air entry equal and Decreased, no use of accessory muscle, Clear to Auscultation, no Crackles, no wheezes Abdomen: Bowel Sound present, Soft and no tenderness Skin: no redness, no Rash, no induration Extremities: bilateral Pedal edema, no calf tenderness Neurologic: Grossly no focal neuro deficit. Bilaterally Equal  motor strength  Data Reviewed: CBC:  Recent Labs Lab 08/10/16 1418 08/11/16 0443 08/12/16 0933  WBC 9.1 9.8 8.5  HGB 7.5* 9.3* 10.7*  HCT 26.7* 31.8* 36.9*  MCV 73.4* 76.4* 77.7*  PLT 452* 410* 454*   Basic Metabolic Panel:  Recent Labs Lab 08/10/16 1418 08/11/16 0443 08/12/16 0933  NA 139 138 138  K 4.0 4.5 3.8  CL 104 104 100*  CO2 26 26 30   GLUCOSE 116* 107* 135*  BUN 20 14 15   CREATININE 1.20 1.12 1.28*  CALCIUM 8.8* 8.9 9.0  MG  --  2.6* 2.3    Liver Function Tests:  Recent Labs Lab 08/11/16 0443  AST 15  ALT 9*    ALKPHOS 51  BILITOT 1.8*  PROT 5.7*  ALBUMIN 3.2*   No results for input(s): LIPASE, AMYLASE in the last 168 hours. No results for input(s): AMMONIA in the last 168 hours. Coagulation Profile:  Recent Labs Lab 08/10/16 1647  INR 1.24   Cardiac Enzymes: No results for input(s): CKTOTAL, CKMB, CKMBINDEX, TROPONINI in the last 168 hours. BNP (last 3 results) No results for input(s): PROBNP in the last 8760 hours.  CBG: No results for input(s): GLUCAP in the last 168 hours.  Studies: No results found.   Scheduled Meds: . carvedilol  6.25 mg Oral BID WC  . furosemide  40 mg Intravenous Q12H  . guaiFENesin  600 mg Oral BID  . [START ON 08/13/2016] lisinopril  10 mg Oral Daily  . mometasone-formoterol  2 puff Inhalation BID  . pantoprazole  40 mg Oral BID AC  . sodium chloride flush  3 mL Intravenous Q12H   Continuous Infusions: PRN Meds: acetaminophen **OR** acetaminophen, cyclobenzaprine, ipratropium-albuterol, ondansetron **OR** ondansetron (ZOFRAN) IV, polyethylene glycol  Time spent: 30 minutes  Author: Lynden Oxford, MD [facility] Pager: [phone] 08/12/2016 2:29 PM  If 7PM-7AM, please contact night-coverage at [email or web address], password [facility]

## 2016-08-12 NOTE — Progress Notes (Signed)
Subjective: Mild upper abdominal pain. No hematemesis, melena, hematochezia.  Objective: Vital signs in last 24 hours: Temp:  [98.2 F (36.8 C)-98.8 F (37.1 C)] 98.2 F (36.8 C) (03/04 0452) Pulse Rate:  [90-101] 90 (03/04 0845) Resp:  [19-20] 20 (03/04 0452) BP: (110-129)/(67-74) 118/74 (03/04 0845) SpO2:  [93 %-100 %] 96 % (03/04 0754) Weight:  [106.6 kg (235 lb)] 106.6 kg (235 lb) (03/04 0216) Weight change: 1.361 kg (3 lb) Last BM Date: 08/11/16  PE: GEN:  Overweight, NAD ABD:  Soft, non-tender  Lab Results: CBC    Component Value Date/Time   WBC 8.5 08/12/2016 0933   RBC 4.75 08/12/2016 0933   HGB 10.7 (L) 08/12/2016 0933   HCT 36.9 (L) 08/12/2016 0933   PLT 454 (H) 08/12/2016 0933   MCV 77.7 (L) 08/12/2016 0933   MCH 22.5 (L) 08/12/2016 0933   MCHC 29.0 (L) 08/12/2016 0933   RDW 18.0 (H) 08/12/2016 0933   CMP     Component Value Date/Time   NA 138 08/12/2016 0933   K 3.8 08/12/2016 0933   CL 100 (L) 08/12/2016 0933   CO2 30 08/12/2016 0933   GLUCOSE 135 (H) 08/12/2016 0933   BUN 15 08/12/2016 0933   CREATININE 1.28 (H) 08/12/2016 0933   CALCIUM 9.0 08/12/2016 0933   PROT 5.7 (L) 08/11/2016 0443   ALBUMIN 3.2 (L) 08/11/2016 0443   AST 15 08/11/2016 0443   ALT 9 (L) 08/11/2016 0443   ALKPHOS 51 08/11/2016 0443   BILITOT 1.8 (H) 08/11/2016 0443   GFRNONAA 57 (L) 08/12/2016 0933   GFRAA >60 08/12/2016 0933   Assessment:  1.  Melena. 2.  Acute blood loss anemia. 3.  Gastric ulcers and esophagitis, low-risk stigmata for rebleeding, likely NSAID-mediated, H. Pylori serologies pending. 4.  Low cardiac ejection fraction with evidence of right heart strain.  Plan:  1.  Advance diet to cardiac prudent. 2.  Pantoprazole 40 mg po bid now and upon discharge. 3.  Cardiac work-up in progress. 4.  Awaiting H. Pylori serologies. 5.  No NSAIDs if at all possible (baby ASA is ok) for the next few weeks. 6.  Can follow-up with me as outpatient, for  consideration of repeat EGD in 3 months, accompanied by screening colonoscopy at same sedation session, of course pending outcome of his ongoing cardiac evaluation. 7.  Will sign-off; please call with questions; thank you for the consultation.   [name] 08/12/2016, 2:21 PM   Pager [phone] If no answer or after 5 PM call [phone]

## 2016-08-12 NOTE — Progress Notes (Signed)
Pt has 5 unmeasured urine occurrence, instructed to collect urine when lasix give. Pt stated he did not remember to use urinal.

## 2016-08-12 NOTE — Consult Note (Signed)
CARDIOLOGY CONSULT NOTE   Patient ID: [name] MRN: [id number] DOB/AGE: [birth year] 66 y.o.  Admit date: 08/10/2016  Requesting Physician: Dr. Allena Katz  Primary Physician:   Frederich Chick, MD Primary Cardiologist:  New Reason for Consultation: newly reduced EF.   HPI: Kristopher [name] is a 66 y.o. male with a history of treated HTN and asthma who presented to [facility] on 08/10/16 with SOB. He was found to have symptomatic anemia and newly reduced EF and cardiology consulted.   He presented to [facility] on 08/10/16 where he was seen for four days of fatigue, SOB, and bilateral leg swelling. He was sent to the ER where he was found Hg of 7.5 and positive blood in stool. He was transfused 2U PRBCs and underwent EGD on 08/11/16 showed 2 gastric ulcers as well as esophagitis. He was placed on PPI and told to discontinue NSAIDS. 2D ECHO on 08/11/16 showed EF 15-20% with diffuse HK, G2DD with elevated filling pressures, mod RV dilation, mod decreased RVEF, severe pulmonary HTN, and mild posterior pericardial effusion with no evidence of tamponade.   He was placed on IV lasix 40mg  BID and is feeling much better. He denies chest pain. He says he noticed some LE edema (L > R) as well as black stools for at least 3-4 weeks. It was only over the past week or so that he started to feel very weak, fatigued and SOB. 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 He denies previous exertional CP or SOB. He wasn't able to work last week due to feeling so poorly. He states that he always sleeps on at least 7 pillows and falls asleep sitting up watching TV. Not aware of any orthopnea but maybe had a couple episodes of PND last week. He denies a strong family history of heart disease. He does not smoke. He does take Alieve at least daily for different aches and pains.       Past Medical History:  Diagnosis Date  . Asthma      History reviewed. No pertinent surgical  history.  Allergies  Allergen Reactions  . Penicillins     Pt reports it makes him feel shaky Has patient had a PCN reaction causing immediate rash, facial/tongue/throat swelling, SOB or lightheadedness with hypotension: YES Has patient had a PCN reaction causing severe rash involving mucus membranes or skin necrosis: NO Has patient had a PCN reaction that required hospitalization NO Has patient had a PCN reaction occurring within the last 10 years: NO If all of the above answers are "NO", then may proceed with Cephalosporin use.    I have reviewed the patient's current medications . carvedilol  3.125 mg Oral BID WC  . furosemide  40 mg Intravenous Q12H  . guaiFENesin  600 mg Oral BID  . ipratropium-albuterol  3 mL Nebulization BID  . lisinopril  5 mg Oral Daily  . mometasone-formoterol  2 puff Inhalation BID  . pantoprazole  40 mg Oral BID AC  . sodium chloride flush  3 mL Intravenous Q12H    acetaminophen **OR** acetaminophen, cyclobenzaprine, ondansetron **OR** ondansetron (ZOFRAN) IV, polyethylene glycol  Prior to Admission medications   Medication Sig Start Date End Date Taking? Authorizing Provider  albuterol (PROVENTIL HFA;VENTOLIN HFA) 108 (90 Base) MCG/ACT inhaler Inhale 2 puffs into the lungs every 6 (six) hours as needed for wheezing or shortness of breath.   Yes Historical Provider, MD  budesonide-formoterol (SYMBICORT) 80-4.5 MCG/ACT inhaler Inhale 1 puff into the lungs daily.   Yes Historical Provider, MD  cyclobenzaprine (FLEXERIL) 10 MG tablet Take 10 mg by mouth 3 (three) times daily as needed for muscle spasms.   Yes Historical Provider, MD  meloxicam (MOBIC) 15 MG tablet Take 15 mg by mouth daily.   Yes Historical Provider, MD     Social History   Social History  . Marital status: Single    Spouse name: N/A  . Number of children: N/A  . Years of education: N/A   Occupational History  . Not on file.   Social History Main Topics  . Smoking status: Former  Games developer  . Smokeless tobacco: Never Used  . Alcohol use No  . Drug use: No  . Sexual activity: Not on file   Other Topics Concern  . Not on file   Social History Narrative  . No narrative on file    Family Status  Relation Status  . Mother    Family History  Problem Relation Age of Onset  . Hypertension Mother        ROS:  Full 14 point review of systems complete and found to be negative unless listed above.  Physical Exam: Blood pressure 118/74, pulse 90, temperature 98.2 F (36.8 C), resp. rate 20, height 5\' 7"  (1.702 m), weight 235 lb (106.6 kg), SpO2 96 %.  General: Well developed, well nourished, male in no acute distress Head: Eyes PERRLA, No xanthomas.   Normocephalic and atraumatic, oropharynx without edema or exudate. Lungs: decreased breath sounds at bases Heart: HRRR S1 S2, no rub/gallop, Heart regular rate and rhythm with S1, S2  No murmur. pulses are 2+ extrem.   Neck: No carotid bruits. No lymphadenopathy. no JVD. Abdomen: Bowel sounds present, abdomen soft and non-tender without masses or hernias noted. Msk:  No spine or cva tenderness. No weakness, no joint deformities or effusions. Extremities: No clubbing or cyanosis.  1+ LLE and 2+RLE edema.  Neuro: Alert and oriented X 3. No focal deficits noted. Psych:  Good affect, responds appropriately Skin: No rashes or lesions noted.  Labs:   Lab Results  Component Value Date   WBC 8.5 08/12/2016   HGB 10.7 (L) 08/12/2016   HCT 36.9 (L) 08/12/2016   MCV 77.7 (L) 08/12/2016   PLT 454 (H) 08/12/2016    Recent Labs  08/10/16 1647  INR 1.24    Recent Labs Lab 08/11/16 0443 08/12/16 0933  NA 138 138  K 4.5 3.8  CL 104 100*  CO2 26 30  BUN 14 15  CREATININE 1.12 1.28*  CALCIUM 8.9 9.0  PROT 5.7*  --   BILITOT 1.8*  --   ALKPHOS 51  --   ALT 9*  --   AST 15  --   GLUCOSE 107* 135*  ALBUMIN 3.2*  --    Magnesium  Date Value Ref Range Status  08/12/2016 2.3 1.7 - 2.4 mg/dL Final   No results  for input(s): CKTOTAL, CKMB, TROPONINI in the last 72 hours.  Recent Labs  08/10/16 1425  TROPIPOC 0.01   No results found for: PROBNP Lab Results  Component Value Date   CHOL 143 08/12/2016   HDL 39 (L) 08/12/2016   LDLCALC 89 08/12/2016   TRIG 73 08/12/2016   Echo: 08/11/2016 LV EF: 20%- 25% Study Conclusions - Left ventricle: The cavity size was mildly dilated. There was   moderate concentric hypertrophy. Systolic function was severely   reduced.  The estimated ejection fraction was in the range of   15-20%. Wall motion was normal; there were no regional wall   motion abnormalities. Features are consistent with a pseudonormal   left ventricular filling pattern, with concomitant abnormal   relaxation and increased filling pressure (grade 2 diastolic   dysfunction). Doppler parameters are consistent with elevated   ventricular end-diastolic filling pressure. - Ventricular septum: Septal motion showed paradox. - Mitral valve: There was mild regurgitation. - Left atrium: The atrium was severely dilated. - Right ventricle: The cavity size was moderately dilated. Wall   thickness was normal. Systolic function was moderately reduced. - Right atrium: The atrium was mildly dilated. - Tricuspid valve: There was moderate regurgitation. - Pulmonary arteries: Systolic pressure was severely increased. PA   peak pressure: 53 mm Hg (S). - Inferior vena cava: The vessel was normal in size. - Pericardium, extracardiac: A trivial pericardial effusion was   identified posterior to the heart. Features were not consistent   with tamponade physiology. Impressions: - Mildly dilated LV with severely decreased LVEF 15-20% and diffuse   hypokinesis.   Grade 2 diastolic dysfunction with severely elevated filling   pressures.   RV is moderately dilated with moderate RVEF decrease. Severe   pulmonary hypertension.   No thrombus in the LV.   Mild posterior pericardial effusion with no evidence  for   tamponade.  ECG:  Sinus tachy HR 107, LBBB  Radiology:  Dg Chest 2 View  Result Date: 08/10/2016 CLINICAL DATA:  Cough and short of breath EXAM: CHEST  2 VIEW COMPARISON:  07/12/2010 FINDINGS: Cardiac enlargement. Negative for heart failure. Lungs are clear without infiltrate effusion or mass. Degenerative changes in the shoulder joints bilaterally. IMPRESSION: No active cardiopulmonary disease. Electronically Signed   By: Marlan Palau M.D.   On: 08/10/2016 14:40    ASSESSMENT AND PLAN:    Principal Problem:   Acute upper GI bleed Active Problems:   DOE (dyspnea on exertion)   Pedal edema   LBBB (left bundle branch block)   Acute systolic CHF (congestive heart failure) (HCC)   Acute blood loss anemia  Kristopher [name] is a 66 y.o. male with a history of HTN and asthma who presented to [facility] on 08/10/16 with SOB. He was found to have symptomatic anemia and newly reduced EF and cardiology consulted.   Acute blood loss anemia: Hg 7.5 --> 10.7 after 2U PRBCs. EGD on 08/11/16 showed 2 gastric ulcers as well as esophagitis. He was placed on PPI and told to discontinue NSAIDS.  Newly reduced EF: 2D ECHO on 08/11/16 showed EF 15-20% with diffuse HK, G2DD with elevated filling pressures, mod RV dilation, mod decreased RVEF, severe pulmonary HTN, and mild posterior pericardial effusion with no evidence of tamponade. He is currently feeling better in terms of SOB s/p transfusion and IV lasix. He had severe swelling in (L>R) leg that has improved. He has been started on Coreg 3.125mg  BID and Lisnopril 5mg  daily. He denies ever having chest pain or any strong family hx of CAD. I still think with this marked decrease in LV function, he Juelz Whittenberg need a L/RHC. Also the possibility of a PE may need to be excluded. He is not hypoxic but has been tachycardic with unilateral leg swelling. His echo shows RV dilation and systolic dysfunction. Rodolfo Gaster discuss case with Dr. Elberta Fortis.  HTN: BP well controlled currently     Signed: Cline Crock, PA-C 08/12/2016 11:45 AM  Pager [phone]  Co-Sign MD  I  have seen and examined this patient with Cline Crock.  Agree with above, note added to reflect my findings.  On exam, RRR, no murmurs, lungs clear.   Presented to the hospital with dyspnea on exertion and dark tarry stools. GI workup was done and showed esophagitis with gastric ulcers. He was placed on a PPI. Echocardiogram was done which showed an ejection fraction of 15-20% with diffuse hypokinesis and grade 2 diastolic dysfunction with moderate RV dysfunction and severe pulmonary hypertension. Says that he's been having shortness of breath for the past few months. He sleeps on 7 pillows at night, but this is due to comfort issues and not shortness of breath. That being said, he does endorse PND type symptoms over the last few weeks. He is able to walk up to 30 feet without getting short of breath. He says that he has had swelling in his legs with his left leg greater than right leg swelling. She does not endorse any chest pain. He's been started on carvedilol 3.125 mg twice a day with lisinopril 5 mg. He is tachycardic, and would likely benefit from optimization of his heart failure. We'll increase his carvedilol to 6.25 which can be further increased depending on how he tolerates. We'll also increase his lisinopril to 10 mg. Due to his evidence of severe pulmonary hypertension, right heart failure, and swelling left greater than right, we'll order a CTPA to evaluate for PE.  Travares Nelles M. Denece Shearer MD 08/12/2016 1:52 PM

## 2016-08-13 ENCOUNTER — Inpatient Hospital Stay (HOSPITAL_COMMUNITY): Payer: BC Managed Care – PPO

## 2016-08-13 ENCOUNTER — Encounter (HOSPITAL_COMMUNITY): Payer: Self-pay | Admitting: Gastroenterology

## 2016-08-13 DIAGNOSIS — R0609 Other forms of dyspnea: Secondary | ICD-10-CM

## 2016-08-13 DIAGNOSIS — I447 Left bundle-branch block, unspecified: Secondary | ICD-10-CM

## 2016-08-13 DIAGNOSIS — I10 Essential (primary) hypertension: Secondary | ICD-10-CM

## 2016-08-13 DIAGNOSIS — M7989 Other specified soft tissue disorders: Secondary | ICD-10-CM

## 2016-08-13 DIAGNOSIS — I420 Dilated cardiomyopathy: Secondary | ICD-10-CM

## 2016-08-13 DIAGNOSIS — I5021 Acute systolic (congestive) heart failure: Secondary | ICD-10-CM

## 2016-08-13 LAB — CBC
HEMATOCRIT: 36.9 % — AB (ref 39.0–52.0)
HEMATOCRIT: 35.3 % — AB (ref 39.0–52.0)
Hemoglobin: 10.8 g/dL — ABNORMAL LOW (ref 13.0–17.0)
Hemoglobin: 10.3 g/dL — ABNORMAL LOW (ref 13.0–17.0)
MCH: 22.4 pg — ABNORMAL LOW (ref 26.0–34.0)
MCH: 22.8 pg — AB (ref 26.0–34.0)
MCHC: 29.3 g/dL — AB (ref 30.0–36.0)
MCHC: 29.2 g/dL — ABNORMAL LOW (ref 30.0–36.0)
MCV: 76.6 fL — AB (ref 78.0–100.0)
MCV: 78.1 fL (ref 78.0–100.0)
Platelets: 482 10*3/uL — ABNORMAL HIGH (ref 150–400)
Platelets: 455 10*3/uL — ABNORMAL HIGH (ref 150–400)
RBC: 4.82 MIL/uL (ref 4.22–5.81)
RBC: 4.52 MIL/uL (ref 4.22–5.81)
RDW: 18 % — ABNORMAL HIGH (ref 11.5–15.5)
RDW: 18 % — ABNORMAL HIGH (ref 11.5–15.5)
WBC: 9 10*3/uL (ref 4.0–10.5)
WBC: 9.8 10*3/uL (ref 4.0–10.5)

## 2016-08-13 LAB — BASIC METABOLIC PANEL
ANION GAP: 5 (ref 5–15)
BUN: 17 mg/dL (ref 6–20)
CHLORIDE: 101 mmol/L (ref 101–111)
CO2: 31 mmol/L (ref 22–32)
Calcium: 8.7 mg/dL — ABNORMAL LOW (ref 8.9–10.3)
Creatinine, Ser: 1.29 mg/dL — ABNORMAL HIGH (ref 0.61–1.24)
GFR calc Af Amer: >60 mL/min (ref >60)
GFR, EST NON AFRICAN AMERICAN: 57 mL/min — AB (ref >60)
GLUCOSE: 104 mg/dL — AB (ref 65–99)
POTASSIUM: 4 mmol/L (ref 3.5–5.1)
Sodium: 137 mmol/L (ref 135–145)

## 2016-08-13 LAB — MAGNESIUM: Magnesium: 2.3 mg/dL (ref 1.7–2.4)

## 2016-08-13 LAB — GLUCOSE, CAPILLARY: GLUCOSE-CAPILLARY: 133 mg/dL — AB (ref 65–99)

## 2016-08-13 MED ORDER — SODIUM CHLORIDE 0.9 % IV BOLUS (SEPSIS): 500.0000 mL | Freq: Once | INTRAVENOUS | Status: DC

## 2016-08-13 MED ORDER — LOSARTAN POTASSIUM 50 MG PO TABS: 25.0000 mg | ORAL_TABLET | Freq: Every day | ORAL | Status: DC

## 2016-08-13 MED ORDER — SODIUM CHLORIDE 0.9 % IV BOLUS (SEPSIS)
1000.0000 mL | Freq: Once | INTRAVENOUS | Status: AC
  Administered 2016-08-13: 1000 mL via INTRAVENOUS

## 2016-08-13 MED ORDER — CARVEDILOL 3.125 MG PO TABS
3.1250 mg | ORAL_TABLET | Freq: Two times a day (BID) | ORAL | Status: DC
  Administered 2016-08-14: 3.125 mg via ORAL
  Filled 2016-08-13: qty 1

## 2016-08-13 MED ORDER — FUROSEMIDE 40 MG PO TABS
40.0000 mg | ORAL_TABLET | Freq: Every day | ORAL | Status: DC
  Administered 2016-08-13: 40 mg via ORAL
  Filled 2016-08-13: qty 1

## 2016-08-13 MED ORDER — FUROSEMIDE 20 MG PO TABS
20.0000 mg | ORAL_TABLET | Freq: Every day | ORAL | Status: DC
  Administered 2016-08-14 – 2016-08-17 (×3): 20 mg via ORAL
  Filled 2016-08-13 (×3): qty 1

## 2016-08-13 NOTE — Progress Notes (Signed)
New orders received. Madelin Rear, MSN, RN, [employer]

## 2016-08-13 NOTE — Progress Notes (Signed)
Progress Note  Patient Name: Kristopher [name] Date of Encounter: 08/13/2016  Primary Cardiologist: New (seen by Dr. Elberta Fortis for consultation, however will be follow up general cardiologist)  Subjective   Feeling well. No chest pain, sob or palpitations.   Inpatient Medications    Scheduled Meds: . carvedilol  6.25 mg Oral BID WC  . furosemide  40 mg Oral Daily  . guaiFENesin  600 mg Oral BID  . lisinopril  10 mg Oral Daily  . mometasone-formoterol  2 puff Inhalation BID  . pantoprazole  40 mg Oral BID AC  . sodium chloride flush  3 mL Intravenous Q12H   Continuous Infusions:  PRN Meds: acetaminophen **OR** acetaminophen, cyclobenzaprine, ipratropium-albuterol, ondansetron **OR** ondansetron (ZOFRAN) IV, polyethylene glycol   Vital Signs    Vitals:   08/12/16 1955 08/12/16 2246 08/13/16 0523 08/13/16 0836  BP:  118/62 124/61   Pulse:  90 96   Resp:  17 17   Temp:  98 F (36.7 C) 98.6 F (37 C)   TempSrc:  Oral Oral   SpO2: 96% 99% 93% 95%  Weight:   233 lb 14.5 oz (106.1 kg)   Height:        Intake/Output Summary (Last 24 hours) at 08/13/16 0950 Last data filed at 08/13/16 0100  Gross per 24 hour  Intake                0 ml  Output              900 ml  Net             -900 ml   Filed Weights   08/11/16 0051 08/12/16 0216 08/13/16 0523  Weight: 233 lb (105.7 kg) 235 lb (106.6 kg) 233 lb 14.5 oz (106.1 kg)    Telemetry    N/A  ECG    N/A  Physical Exam   GEN: No acute distress.   Neck: No JVD Cardiac: RRR, no murmurs, rubs, or gallops. Trace to 1+ edema bilaterally L > R Respiratory: Clear to auscultation bilaterally. GI: Soft, nontender, non-distended  MS: No edema; No deformity. Neuro:  Nonfocal  Psych: Normal affect   Labs    Chemistry Recent Labs Lab 08/11/16 0443 08/12/16 0933 08/13/16 0810  NA 138 138 137  K 4.5 3.8 4.0  CL 104 100* 101  CO2 26 30 31   GLUCOSE 107* 135* 104*  BUN 14 15 17   CREATININE 1.12 1.28* 1.29*  CALCIUM  8.9 9.0 8.7*  PROT 5.7*  --   --   ALBUMIN 3.2*  --   --   AST 15  --   --   ALT 9*  --   --   ALKPHOS 51  --   --   BILITOT 1.8*  --   --   GFRNONAA >60 57* 57*  GFRAA >60 >60 >60  ANIONGAP 8 8 5      Hematology Recent Labs Lab 08/11/16 0443 08/12/16 0933 08/13/16 0810  WBC 9.8 8.5 9.0  RBC 4.16* 4.75 4.82  HGB 9.3* 10.7* 10.8*  HCT 31.8* 36.9* 36.9*  MCV 76.4* 77.7* 76.6*  MCH 22.4* 22.5* 22.4*  MCHC 29.2* 29.0* 29.3*  RDW 18.2* 18.0* 18.0*  PLT 410* 454* 482*    Cardiac EnzymesNo results for input(s): TROPONINI in the last 168 hours.  Recent Labs Lab 08/10/16 1425  TROPIPOC 0.01     BNP Recent Labs Lab 08/10/16 1418  BNP 847.8*     DDimer No  results for input(s): DDIMER in the last 168 hours.   Radiology    Ct Angio Chest Pe W Or Wo Contrast  Result Date: 08/12/2016 CLINICAL DATA:  Increasing shortness of breath, chest pain. History of asthma. Hypertension. EXAM: CT ANGIOGRAPHY CHEST WITH CONTRAST TECHNIQUE: Multidetector CT imaging of the chest was performed using the standard protocol during bolus administration of intravenous contrast. Multiplanar CT image reconstructions and MIPs were obtained to evaluate the vascular anatomy. CONTRAST:  80 cc Isovue 370 COMPARISON:  None. FINDINGS: Cardiovascular: There is no pulmonary embolism identified within the main, lobar or segmental pulmonary arteries bilaterally. No aortic aneurysm or secondary evidence of an aortic dissection. Cardiomegaly. No pericardial effusion. Mediastinum/Nodes: Soft tissue density mass within the left paratracheal mediastinum, measuring 2 cm greatest dimension and 1.3 cm short axis dimension (series 4, image 33), likely a mildly prominent lymph node. No additional soft tissue masses or enlarged lymph nodes within the mediastinum. Esophagus appears normal. Trachea and central bronchi are unremarkable. Lungs/Pleura: Prominent emphysematous blebs within the right lung apex. Milder emphysematous  changes at the left lung apex and within the lower lung zones. No pneumonia or edema. No pulmonary nodule or mass seen. No pleural effusion or pneumothorax. Upper Abdomen: Liver contours are slightly nodular throughout suggesting underlying cirrhosis. Small hiatal hernia. Musculoskeletal: Mild degenerative spurring within the thoracic spine. No acute or suspicious osseous finding. Superficial soft tissues are unremarkable. Review of the MIP images confirms the above findings. IMPRESSION: 1. No pulmonary embolism. 2. Emphysema, at least moderate in degree overall, with prominent emphysematous blebs at the right lung apex. 3. **An incidental finding of potential clinical significance has been found. Soft tissue density mass within the left paratracheal mediastinum, measuring 2 cm greatest dimension and 1.3 cm short axis dimension, likely a mildly prominent lymph node, possibly reactive. Recommend follow-up chest CT in 6-12 months to ensure stability. ** 4. Cardiomegaly.  No evidence of active CHF or pulmonary edema. 5. Probable liver cirrhosis. Electronically Signed   By: Bary Richard M.D.   On: 08/12/2016 17:47    Cardiac Studies   Echo: 08/11/2016 LV EF: 20%- 25% Study Conclusions - Left ventricle: The cavity size was mildly dilated. There was moderate concentric hypertrophy. Systolic function was severely reduced. The estimated ejection fraction was in the range of 15-20%. Wall motion was normal; there were no regional wall motion abnormalities. Features are consistent with a pseudonormal left ventricular filling pattern, with concomitant abnormal relaxation and increased filling pressure (grade 2 diastolic dysfunction). Doppler parameters are consistent with elevated ventricular end-diastolic filling pressure. - Ventricular septum: Septal motion showed paradox. - Mitral valve: There was mild regurgitation. - Left atrium: The atrium was severely dilated. - Right ventricle: The  cavity size was moderately dilated. Wall thickness was normal. Systolic function was moderately reduced. - Right atrium: The atrium was mildly dilated. - Tricuspid valve: There was moderate regurgitation. - Pulmonary arteries: Systolic pressure was severely increased. PA peak pressure: 53 mm Hg (S). - Inferior vena cava: The vessel was normal in size. - Pericardium, extracardiac: A trivial pericardial effusion was identified posterior to the heart. Features were not consistent with tamponade physiology. Impressions: - Mildly dilated LV with severely decreased LVEF 15-20% and diffuse hypokinesis. Grade 2 diastolic dysfunction with severely elevated filling pressures. RV is moderately dilated with moderate RVEF decrease. Severe pulmonary hypertension. No thrombus in the LV. Mild posterior pericardial effusion with no evidence for tamponade.  Patient Profile     JARQUISE HIGGS is a 66  y.o. male with a history of treated HTN and asthma who presented to [facility] on 08/10/16 with SOB. He was found to have symptomatic anemia and newly reduced EF and cardiology consulted.   Assessment & Plan     1. Acute blood loss anemia: Hg 7.5 --> 10.7 after 2U PRBCs. EGD on 08/11/16 showed 2 gastric ulcers as well as esophagitis. He was placed on PPI.  Discontinued NSAIDS.  2. Newly reduced EF: 2D ECHO on 08/11/16 showed EF 15-20% with diffuse HK, G2DD with elevated filling pressures, mod RV dilation, mod decreased RVEF, severe pulmonary HTN, and mild posterior pericardial effusion with no evidence of tamponade. He is currently feeling better in terms of SOB s/p transfusion and IV lasix. He had severe swelling in (L>R) leg that has improved with Lasix.   - CTA without PE or CHF. However did showed emphysematous changes and prominent lymph note. Per primary team. LE doppler without DVT.   - Up titrated CHF medication yesterday. He was eating lot of salt at home and his BP "always runs high  at home". - Will discussed with timing of ischemic evaluation with MD. He ate today. Inpatient vs outpatient. Never had chest pain.  - Continue Coreg 6.25mg  and Lisinopril 10mg .   3. HTN:  BP well controlled currently   4. AKI - Scr stable this morning. Follow closely.   Signed, Manson Passey, PA  08/13/2016, 9:50 AM

## 2016-08-13 NOTE — Progress Notes (Signed)
VASCULAR LAB PRELIMINARY  PRELIMINARY  PRELIMINARY  PRELIMINARY  Bilateral lower extremity venous duplex completed.    Preliminary report:  There is no DVT or SVT noted in the bilateral lower extremities.   Jailani Hogans, RVT 08/13/2016, 8:13 AM

## 2016-08-13 NOTE — Progress Notes (Signed)
   08/13/16 1435  Vitals  BP (!) 73/40  MAP (mmHg) (!) 48  BP Method Automatic  Patient Position (if appropriate) Lying  Pulse Rate 68  Dr Allena Katz paged awaiting return call

## 2016-08-13 NOTE — Progress Notes (Signed)
[facility] Progress Note  Patient: Kristopher [name] ZOX:[id number]   PCP: Frederich Chick, MD DOB: 05-Jan-1951   DOA: 08/10/2016   DOS: 08/13/2016   Date of Service: the patient was seen and examined on 08/13/2016   Subjective: Feeling better, still has some shortness of breath. No nausea no vomiting.In the afternoon blood pressure dropped significantly patient was diaphoretic and dizzy. No chest pain.  Brief hospital course: Pt. 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 EGD shows gastric ulcer, Hb remained stable Currently further plan is further workup of systolic dysfunction.  Assessment and Plan: 1. Acute upper GI bleed S/P EGD, 2 gastric ulcers identified as well as esophagitis. Continue PPI. Avoid NSAIDs. Stable H&H. Gastroenterology signed off  2. Dyspnea on exertion. Acute combined diastolic and systolic CHF. Pulmonary hypertension based on echocardiogram. RV dysfunction. Pedal edema  Patient presents with complaints of shortness of breath on exertion, echogram shows EF of 15% with diffuse hypokinesis as well as cardiomegaly. No prior echocardiogram to compare. Consulted cardiology for further input. Continue IV Lasix 40 mg every 12 hours. Stared him on Coreg 3.125 mg twice a day. And lisinopril 5 mg daily. Dose increased by cardiology and patient unable to tolerate, was significantly hypotensive with symptoms of diaphoresis, dizziness. Received IV fluid bolus. We'll resume low-dose Coreg and lisinopril with holding parameters Lasix when necessary 20 mg daily.  LDL 89. Due to tachycardia and pedal edema cardiology ordered a CTPE protocol, that was negative for PE and also lower extremity Doppler was negative for PE.  3. Chronic LBBB. No evidence of ACS. Continue to monitor. No ischemic work up  Recommended  4. Abnormal lymph node enlargement. CT scan shows one solitary lymph node which was 2 cm in diameter. Recommend repeat CT scan in 6 months.  Bowel regimen: last BM 08/11/2016 Diet: cardiac diet DVT Prophylaxis: mechanical compression device.  Advance goals of care discussion: full code  Family Communication: no family was present at bedside, at the time of interview.  Disposition:  Discharge to home. Expected discharge date: 08/14/2016, depending on cardiology workup.  Consultants: gastroenterology, cardiology  Procedures: Echocardiogram , EGD, PRBC transfusion  Antibiotics: Anti-infectives    None      Objective: Physical Exam: Vitals:   08/13/16 1433 08/13/16 1435 08/13/16 1445 08/13/16 1616  BP: (!) 56/43 (!) 73/40 (!) 75/43 120/81  Pulse: 78 68 75 88  Resp:      Temp:  98 F (36.7 C)    TempSrc:      SpO2:  99%    Weight:      Height:        Intake/Output Summary (Last 24 hours) at 08/13/16 1622 Last data filed at 08/13/16 0100  Gross per 24 hour  Intake                0 ml  Output              900 ml  Net             -900 ml   Filed Weights   08/11/16 0051 08/12/16 0216 08/13/16 0523  Weight: 105.7 kg (233 lb) 106.6 kg (235 lb) 106.1 kg (233 lb 14.5 oz)   General: Alert, Awake and Oriented to Time, Place and Person. Appear in mild distress, affect appropriate Eyes: PERRL,  Conjunctiva normal ENT: Oral Mucosa clear moist. Neck: difficult to assess JVD, no Abnormal Mass Or lumps Cardiovascular: S1 and S2 Present, no Murmur, Respiratory: Bilateral Air entry equal and Decreased, no use of accessory muscle, Clear to Auscultation, no Crackles, no wheezes Abdomen: Bowel Sound present, Soft and no tenderness Skin: no redness, no Rash, no induration Extremities: Trace Pedal edema, no calf tenderness Neurologic: Grossly no focal neuro deficit. Bilaterally Equal motor strength  Data Reviewed: CBC:  Recent Labs Lab 08/10/16 1418 08/11/16 0443  08/12/16 0933 08/13/16 0810 08/13/16 1454  WBC 9.1 9.8 8.5 9.0 9.8  HGB 7.5* 9.3* 10.7* 10.8* 10.3*  HCT 26.7* 31.8* 36.9* 36.9* 35.3*  MCV 73.4* 76.4* 77.7* 76.6* 78.1  PLT 452* 410* 454* 482* 455*   Basic Metabolic Panel:  Recent Labs Lab 08/10/16 1418 08/11/16 0443 08/12/16 0933 08/13/16 0810  NA 139 138 138 137  K 4.0 4.5 3.8 4.0  CL 104 104 100* 101  CO2 26 26 30 31   GLUCOSE 116* 107* 135* 104*  BUN 20 14 15 17   CREATININE 1.20 1.12 1.28* 1.29*  CALCIUM 8.8* 8.9 9.0 8.7*  MG  --  2.6* 2.3 2.3    Liver Function Tests:  Recent Labs Lab 08/11/16 0443  AST 15  ALT 9*  ALKPHOS 51  BILITOT 1.8*  PROT 5.7*  ALBUMIN 3.2*   No results for input(s): LIPASE, AMYLASE in the last 168 hours. No results for input(s): AMMONIA in the last 168 hours. Coagulation Profile:  Recent Labs Lab 08/10/16 1647  INR 1.24   Cardiac Enzymes: No results for input(s): CKTOTAL, CKMB, CKMBINDEX, TROPONINI in the last 168 hours. BNP (last 3 results) No results for input(s): PROBNP in the last 8760 hours.  CBG:  Recent Labs Lab 08/13/16 1430  GLUCAP 133*    Studies: Ct Angio Chest Pe W Or Wo Contrast  Result Date: 08/12/2016 CLINICAL DATA:  Increasing shortness of breath, chest pain. History of asthma. Hypertension. EXAM: CT ANGIOGRAPHY CHEST WITH CONTRAST TECHNIQUE: Multidetector CT imaging of the chest was performed using the standard protocol during bolus administration of intravenous contrast. Multiplanar CT image reconstructions and MIPs were obtained to evaluate the vascular anatomy. CONTRAST:  80 cc Isovue 370 COMPARISON:  None. FINDINGS: Cardiovascular: There is no pulmonary embolism identified within the main, lobar or segmental pulmonary arteries bilaterally. No aortic aneurysm or secondary evidence of an aortic dissection. Cardiomegaly. No pericardial effusion. Mediastinum/Nodes: Soft tissue density mass within the left paratracheal mediastinum, measuring 2 cm greatest  dimension and 1.3 cm short axis dimension (series 4, image 33), likely a mildly prominent lymph node. No additional soft tissue masses or enlarged lymph nodes within the mediastinum. Esophagus appears normal. Trachea and central bronchi are unremarkable. Lungs/Pleura: Prominent emphysematous blebs within the right lung apex. Milder emphysematous changes at the left lung apex and within the lower lung zones. No pneumonia or edema. No pulmonary nodule or mass seen. No pleural effusion or pneumothorax. Upper Abdomen: Liver contours are slightly nodular throughout suggesting underlying cirrhosis. Small hiatal hernia. Musculoskeletal: Mild degenerative spurring within the thoracic spine. No acute or suspicious osseous finding. Superficial soft tissues are unremarkable. Review of the MIP images confirms the above findings. IMPRESSION: 1. No pulmonary embolism. 2. Emphysema, at least moderate in degree overall, with prominent emphysematous blebs at the right lung apex. 3. **An incidental finding of potential clinical significance has been found. Soft tissue density mass within the left paratracheal mediastinum, measuring 2 cm greatest dimension and 1.3 cm short axis  dimension, likely a mildly prominent lymph node, possibly reactive. Recommend follow-up chest CT in 6-12 months to ensure stability. ** 4. Cardiomegaly.  No evidence of active CHF or pulmonary edema. 5. Probable liver cirrhosis. Electronically Signed   By: Bary Richard M.D.   On: 08/12/2016 17:47     Scheduled Meds: . [START ON 08/14/2016] carvedilol  3.125 mg Oral BID WC  . [START ON 08/14/2016] furosemide  20 mg Oral Daily  . guaiFENesin  600 mg Oral BID  . mometasone-formoterol  2 puff Inhalation BID  . pantoprazole  40 mg Oral BID AC  . sodium chloride flush  3 mL Intravenous Q12H   Continuous Infusions: PRN Meds: acetaminophen **OR** acetaminophen, ipratropium-albuterol, ondansetron **OR** ondansetron (ZOFRAN) IV, polyethylene glycol  Time  spent: 30 minutes  Author: Lynden Oxford, MD [facility] Pager: [phone] 08/13/2016 4:22 PM  If 7PM-7AM, please contact night-coverage at [email or web address], password [facility]

## 2016-08-14 DIAGNOSIS — I420 Dilated cardiomyopathy: Secondary | ICD-10-CM | POA: Diagnosis present

## 2016-08-14 LAB — BASIC METABOLIC PANEL
Anion gap: 6 (ref 5–15)
BUN: 20 mg/dL (ref 6–20)
CHLORIDE: 104 mmol/L (ref 101–111)
CO2: 32 mmol/L (ref 22–32)
CREATININE: 1.11 mg/dL (ref 0.61–1.24)
Calcium: 8.9 mg/dL (ref 8.9–10.3)
GFR calc Af Amer: >60 mL/min (ref >60)
GFR calc non Af Amer: >60 mL/min (ref >60)
GLUCOSE: 95 mg/dL (ref 65–99)
Potassium: 4.7 mmol/L (ref 3.5–5.1)
Sodium: 142 mmol/L (ref 135–145)

## 2016-08-14 LAB — CBC
HCT: 36 % — ABNORMAL LOW (ref 39.0–52.0)
Hemoglobin: 10.6 g/dL — ABNORMAL LOW (ref 13.0–17.0)
MCH: 23 pg — AB (ref 26.0–34.0)
MCHC: 29.4 g/dL — AB (ref 30.0–36.0)
MCV: 78.1 fL (ref 78.0–100.0)
PLATELETS: 428 10*3/uL — AB (ref 150–400)
RBC: 4.61 MIL/uL (ref 4.22–5.81)
RDW: 18.5 % — ABNORMAL HIGH (ref 11.5–15.5)
WBC: 9.4 10*3/uL (ref 4.0–10.5)

## 2016-08-14 LAB — H. PYLORI ANTIBODY, IGG: H Pylori IgG: <0.8 Index Value (ref 0.00–0.79)

## 2016-08-14 MED ORDER — SODIUM CHLORIDE 0.9% FLUSH
3.0000 mL | Freq: Two times a day (BID) | INTRAVENOUS | Status: DC
  Administered 2016-08-14 – 2016-08-15 (×2): 3 mL via INTRAVENOUS

## 2016-08-14 MED ORDER — SODIUM CHLORIDE 0.9 % IV SOLN: 250.0000 mL | INTRAVENOUS | Status: DC | PRN

## 2016-08-14 MED ORDER — SODIUM CHLORIDE 0.9 % IV SOLN
INTRAVENOUS | Status: DC
  Administered 2016-08-15: 05:00:00 via INTRAVENOUS

## 2016-08-14 MED ORDER — CARVEDILOL 3.125 MG PO TABS
3.1250 mg | ORAL_TABLET | Freq: Two times a day (BID) | ORAL | Status: DC
  Administered 2016-08-14 – 2016-08-17 (×5): 3.125 mg via ORAL
  Filled 2016-08-14 (×5): qty 1

## 2016-08-14 MED ORDER — SODIUM CHLORIDE 0.9% FLUSH: 3.0000 mL | INTRAVENOUS | Status: DC | PRN

## 2016-08-14 MED ORDER — ASPIRIN 81 MG PO CHEW
81.0000 mg | CHEWABLE_TABLET | ORAL | Status: AC
  Administered 2016-08-15: 81 mg via ORAL
  Filled 2016-08-14: qty 1

## 2016-08-14 NOTE — Progress Notes (Signed)
[facility] Progress Note  Patient: Kristopher [name] XYV:[id number]   PCP: Frederich Chick, MD DOB: 24-Apr-1951   DOA: 08/10/2016   DOS: 08/14/2016   Date of Service: the patient was seen and examined on 08/14/2016   Subjective: Feeling better, No chest pain or shortness of breath no active bleeding. After yesterday's event of diaphoresis with hypertension currently feeling better after hydration.  Brief hospital course: Pt. 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 EGD shows gastric ulcer, Hb remained stable Currently further plan is further workup of systolic dysfunction.  Assessment and Plan: 1. Acute upper GI bleed S/P EGD, 2 nonbleeding gastric ulcers identified as well as esophagitis. Continue PPI. Avoid NSAIDs. GI recommends to also use only 81 mg of aspirin for next few weeks given his gastric ulcers. H. pylori workup negative. Stable H&H. Continue PPI twice a day. Gastroenterology signed off, patient will need a follow-up with Dr. Dulce Sellar for repeat endoscopy in 3 months.  2. Dyspnea on exertion. Acute combined diastolic and systolic CHF. Pulmonary hypertension based on echocardiogram. RV dysfunction. Pedal edema  Patient presents with complaints of shortness of breath on exertion, echogram shows EF of 15% with diffuse hypokinesis as well as cardiomegaly. No prior echocardiogram to compare. Consulted cardiology for further input. Initially was receiving IV Lasix. Stared him on Coreg 3.125 mg twice a day. And lisinopril 5 mg daily. Dose increased by cardiology and patient unable to tolerate, was significantly hypotensive with symptoms of diaphoresis, dizziness. Received IV fluid bolus. We'll resume low-dose Coreg and lisinopril with holding parameters Lasix 20 mg daily. Plan for right and left cardiac  catheterization tomorrow for further workup of his dysfunction. GI only recommends use of 81 mg aspirin for the next few weeks.  LDL 89. Due to tachycardia and pedal edema cardiology ordered a CTPE protocol, that was negative for PE and also lower extremity Doppler was negative for PE.  3. Chronic LBBB. No evidence of ACS. Continue to monitor. right and left cardiac catheterization tomorrow  4. Abnormal lymph node enlargement. CT scan shows one solitary lymph node which was 2 cm in diameter. Recommend repeat CT scan in 6 months.  Bowel regimen: last BM 08/13/2016 Diet: cardiac diet DVT Prophylaxis: mechanical compression device.  Advance goals of care discussion: full code  Family Communication: no family was present at bedside, at the time of interview.  Disposition:  Discharge to home. Expected discharge date: 08/15/2016, depending on cardiology workup.  Consultants: gastroenterology, cardiology  Procedures: Echocardiogram , EGD, PRBC transfusion  Antibiotics: Anti-infectives    None      Objective: Physical Exam: Vitals:   08/13/16 1616 08/13/16 2305 08/14/16 0602 08/14/16 1306  BP: 120/81 130/73 137/66 119/69  Pulse: 88 97 (!) 106 88  Resp:  17 18 17   Temp:  98.4 F (36.9 C) 98 F (36.7 C) 97.5 F (36.4 C)  TempSrc:  Oral Oral Oral  SpO2:  97% 94% 98%  Weight:      Height:        Intake/Output Summary (Last 24 hours) at 08/14/16 1331 Last data filed at 08/13/16 1900  Gross per 24 hour  Intake              240 ml  Output  0 ml  Net              240 ml   Filed Weights   08/11/16 0051 08/12/16 0216 08/13/16 0523  Weight: 105.7 kg (233 lb) 106.6 kg (235 lb) 106.1 kg (233 lb 14.5 oz)   General: Alert, Awake and Oriented to Time, Place and Person. Appear in mild distress, affect appropriate Eyes: PERRL, Conjunctiva normal ENT: Oral Mucosa clear moist. Neck: difficult to assess JVD, no Abnormal Mass Or lumps Cardiovascular: S1 and S2  Present, no Murmur, Respiratory: Bilateral Air entry equal and Decreased, no use of accessory muscle, Clear to Auscultation, no Crackles, no wheezes Abdomen: Bowel Sound present, Soft and no tenderness Skin: no redness, no Rash, no induration Extremities: bilateral Pedal edema, no calf tenderness Neurologic: Grossly no focal neuro deficit. Bilaterally Equal motor strength  Data Reviewed: CBC:  Recent Labs Lab 08/11/16 0443 08/12/16 0933 08/13/16 0810 08/13/16 1454 08/14/16 0459  WBC 9.8 8.5 9.0 9.8 9.4  HGB 9.3* 10.7* 10.8* 10.3* 10.6*  HCT 31.8* 36.9* 36.9* 35.3* 36.0*  MCV 76.4* 77.7* 76.6* 78.1 78.1  PLT 410* 454* 482* 455* 428*   Basic Metabolic Panel:  Recent Labs Lab 08/10/16 1418 08/11/16 0443 08/12/16 0933 08/13/16 0810 08/14/16 0459  NA 139 138 138 137 142  K 4.0 4.5 3.8 4.0 4.7  CL 104 104 100* 101 104  CO2 26 26 30 31  32  GLUCOSE 116* 107* 135* 104* 95  BUN 20 14 15 17 20   CREATININE 1.20 1.12 1.28* 1.29* 1.11  CALCIUM 8.8* 8.9 9.0 8.7* 8.9  MG  --  2.6* 2.3 2.3  --     Liver Function Tests:  Recent Labs Lab 08/11/16 0443  AST 15  ALT 9*  ALKPHOS 51  BILITOT 1.8*  PROT 5.7*  ALBUMIN 3.2*   No results for input(s): LIPASE, AMYLASE in the last 168 hours. No results for input(s): AMMONIA in the last 168 hours. Coagulation Profile:  Recent Labs Lab 08/10/16 1647  INR 1.24   Cardiac Enzymes: No results for input(s): CKTOTAL, CKMB, CKMBINDEX, TROPONINI in the last 168 hours. BNP (last 3 results) No results for input(s): PROBNP in the last 8760 hours.  CBG:  Recent Labs Lab 08/13/16 1430  GLUCAP 133*    Studies: No results found.   Scheduled Meds: . carvedilol  3.125 mg Oral BID WC  . furosemide  20 mg Oral Daily  . guaiFENesin  600 mg Oral BID  . mometasone-formoterol  2 puff Inhalation BID  . pantoprazole  40 mg Oral BID AC  . sodium chloride flush  3 mL Intravenous Q12H   Continuous Infusions: PRN Meds: acetaminophen  **OR** acetaminophen, ipratropium-albuterol, ondansetron **OR** ondansetron (ZOFRAN) IV, polyethylene glycol  Time spent: 30 minutes  Author: Lynden Oxford, MD [facility] Pager: [phone] 08/14/2016 1:31 PM  If 7PM-7AM, please contact night-coverage at [email or web address], password [facility]

## 2016-08-14 NOTE — Progress Notes (Addendum)
Progress Note  Patient Name: Kristopher [name] Date of Encounter: 08/14/2016  Primary Cardiologist: New (seen by Dr. Elberta Fortis for consultation, however will be follow up general cardiologist)  Subjective   Feeling well. No chest pain, sob or palpitations. Had profound hypotensive episode yesterday afternoon with SBP down to after Coreg given earlier that am.   Inpatient Medications    Scheduled Meds: . carvedilol  3.125 mg Oral BID WC  . furosemide  20 mg Oral Daily  . guaiFENesin  600 mg Oral BID  . mometasone-formoterol  2 puff Inhalation BID  . pantoprazole  40 mg Oral BID AC  . sodium chloride flush  3 mL Intravenous Q12H   Continuous Infusions:  PRN Meds: acetaminophen **OR** acetaminophen, ipratropium-albuterol, ondansetron **OR** ondansetron (ZOFRAN) IV, polyethylene glycol   Vital Signs    Vitals:   08/13/16 1616 08/13/16 2305 08/14/16 0602 08/14/16 1306  BP: 120/81 130/73 137/66 119/69  Pulse: 88 97 (!) 106 88  Resp:  17 18 17   Temp:  98.4 F (36.9 C) 98 F (36.7 C) 97.5 F (36.4 C)  TempSrc:  Oral Oral Oral  SpO2:  97% 94% 98%  Weight:      Height:        Intake/Output Summary (Last 24 hours) at 08/14/16 1352 Last data filed at 08/13/16 1900  Gross per 24 hour  Intake              240 ml  Output                0 ml  Net              240 ml   Filed Weights   08/11/16 0051 08/12/16 0216 08/13/16 0523  Weight: 233 lb (105.7 kg) 235 lb (106.6 kg) 233 lb 14.5 oz (106.1 kg)    Telemetry    N/A  ECG    N/A  Physical Exam   GEN: No acute distress.   Neck: No JVD Cardiac: RRR, no murmurs, rubs, or gallops. Trace to 1+ edema bilaterally L > R Respiratory: Clear to auscultation bilaterally. GI: Soft, nontender, non-distended  MS: No edema; No deformity. Neuro:  Nonfocal  Psych: Normal affect   Labs    Chemistry  Recent Labs Lab 08/11/16 0443 08/12/16 0933 08/13/16 0810 08/14/16 0459  NA 138 138 137 142  K 4.5 3.8 4.0 4.7  CL 104  100* 101 104  CO2 26 30 31  32  GLUCOSE 107* 135* 104* 95  BUN 14 15 17 20   CREATININE 1.12 1.28* 1.29* 1.11  CALCIUM 8.9 9.0 8.7* 8.9  PROT 5.7*  --   --   --   ALBUMIN 3.2*  --   --   --   AST 15  --   --   --   ALT 9*  --   --   --   ALKPHOS 51  --   --   --   BILITOT 1.8*  --   --   --   GFRNONAA >60 57* 57* >60  GFRAA >60 >60 >60 >60  ANIONGAP 8 8 5 6      Hematology  Recent Labs Lab 08/13/16 0810 08/13/16 1454 08/14/16 0459  WBC 9.0 9.8 9.4  RBC 4.82 4.52 4.61  HGB 10.8* 10.3* 10.6*  HCT 36.9* 35.3* 36.0*  MCV 76.6* 78.1 78.1  MCH 22.4* 22.8* 23.0*  MCHC 29.3* 29.2* 29.4*  RDW 18.0* 18.0* 18.5*  PLT 482* 455* 428*  Cardiac EnzymesNo results for input(s): TROPONINI in the last 168 hours.   Recent Labs Lab 08/10/16 1425  TROPIPOC 0.01     BNP  Recent Labs Lab 08/10/16 1418  BNP 847.8*     DDimer No results for input(s): DDIMER in the last 168 hours.   Radiology    Ct Angio Chest Pe W Or Wo Contrast  Result Date: 08/12/2016 CLINICAL DATA:  Increasing shortness of breath, chest pain. History of asthma. Hypertension. EXAM: CT ANGIOGRAPHY CHEST WITH CONTRAST TECHNIQUE: Multidetector CT imaging of the chest was performed using the standard protocol during bolus administration of intravenous contrast. Multiplanar CT image reconstructions and MIPs were obtained to evaluate the vascular anatomy. CONTRAST:  80 cc Isovue 370 COMPARISON:  None. FINDINGS: Cardiovascular: There is no pulmonary embolism identified within the main, lobar or segmental pulmonary arteries bilaterally. No aortic aneurysm or secondary evidence of an aortic dissection. Cardiomegaly. No pericardial effusion. Mediastinum/Nodes: Soft tissue density mass within the left paratracheal mediastinum, measuring 2 cm greatest dimension and 1.3 cm short axis dimension (series 4, image 33), likely a mildly prominent lymph node. No additional soft tissue masses or enlarged lymph nodes within the mediastinum.  Esophagus appears normal. Trachea and central bronchi are unremarkable. Lungs/Pleura: Prominent emphysematous blebs within the right lung apex. Milder emphysematous changes at the left lung apex and within the lower lung zones. No pneumonia or edema. No pulmonary nodule or mass seen. No pleural effusion or pneumothorax. Upper Abdomen: Liver contours are slightly nodular throughout suggesting underlying cirrhosis. Small hiatal hernia. Musculoskeletal: Mild degenerative spurring within the thoracic spine. No acute or suspicious osseous finding. Superficial soft tissues are unremarkable. Review of the MIP images confirms the above findings. IMPRESSION: 1. No pulmonary embolism. 2. Emphysema, at least moderate in degree overall, with prominent emphysematous blebs at the right lung apex. 3. **An incidental finding of potential clinical significance has been found. Soft tissue density mass within the left paratracheal mediastinum, measuring 2 cm greatest dimension and 1.3 cm short axis dimension, likely a mildly prominent lymph node, possibly reactive. Recommend follow-up chest CT in 6-12 months to ensure stability. ** 4. Cardiomegaly.  No evidence of active CHF or pulmonary edema. 5. Probable liver cirrhosis. Electronically Signed   By: Bary Richard M.D.   On: 08/12/2016 17:47    Cardiac Studies   Echo: 08/11/2016 LV EF: 20%- 25% Study Conclusions - Left ventricle: The cavity size was mildly dilated. There was moderate concentric hypertrophy. Systolic function was severely reduced. The estimated ejection fraction was in the range of 15-20%. Wall motion was normal; there were no regional wall motion abnormalities. Features are consistent with a pseudonormal left ventricular filling pattern, with concomitant abnormal relaxation and increased filling pressure (grade 2 diastolic dysfunction). Doppler parameters are consistent with elevated ventricular end-diastolic filling pressure. -  Ventricular septum: Septal motion showed paradox. - Mitral valve: There was mild regurgitation. - Left atrium: The atrium was severely dilated. - Right ventricle: The cavity size was moderately dilated. Wall thickness was normal. Systolic function was moderately reduced. - Right atrium: The atrium was mildly dilated. - Tricuspid valve: There was moderate regurgitation. - Pulmonary arteries: Systolic pressure was severely increased. PA peak pressure: 53 mm Hg (S). - Inferior vena cava: The vessel was normal in size. - Pericardium, extracardiac: A trivial pericardial effusion was identified posterior to the heart. Features were not consistent with tamponade physiology. Impressions: - Mildly dilated LV with severely decreased LVEF 15-20% and diffuse hypokinesis. Grade 2 diastolic dysfunction with severely  elevated filling pressures. RV is moderately dilated with moderate RVEF decrease. Severe pulmonary hypertension. No thrombus in the LV. Mild posterior pericardial effusion with no evidence for tamponade.  Patient Profile     CREED KAIL is a 66 y.o. male with a history of treated HTN and asthma who presented to [facility] on 08/10/16 with SOB. He was found to have symptomatic anemia and newly reduced EF and cardiology consulted.   Assessment & Plan     1. Acute blood loss anemia: Hg 7.5 --> 10.7 after 2U PRBCs. EGD on 08/11/16 showed 2 gastric ulcers as well as esophagitis. He was placed on PPI.  Discontinued NSAIDS.  2. Newly reduced EF: 2D ECHO on 08/11/16 showed EF 15-20% with diffuse HK, G2DD with elevated filling pressures, mod RV dilation, mod decreased RVEF, severe pulmonary HTN, and mild posterior pericardial effusion with no evidence of tamponade. He is currently feeling better in terms of SOB s/p transfusion and IV lasix. He had severe swelling in (L>R) leg that has improved with Lasix.   - CTA without PE or CHF. However did showed emphysematous changes and  prominent lymph note. Per primary team. LE doppler without DVT.   - Up titrated CHF medication yesterday. He was eating lot of salt at home and his BP "always runs high at home".  Unfortunately after his first lisinopril dose yesterday am, he developed profound hypotension with SBP with HR in the 40's.  ? Whether this was medication related or vagal.  Will hold ACE I for now and continue BB with parameters since he has had some tachycardia as well.  - I have recommended proceeding with diagnostic right and left heart cath to define coronary anatomy and filling pressures on right and left side.  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  I have discussed the case with Dr. Eldridge Dace who will be doing the cath tomorrow.  Once the diagnostic study is done will discuss further intervention if needed (in light of gastric ulcers) prior to proceeding with any PCI.  Cardiac catheterization was discussed with the patient fully. The patient understands that risks include but are not limited to stroke (1 in 1000), death (1 in 1000), kidney failure [usually temporary] (1 in 500), bleeding (1 in 200), allergic reaction [possibly serious] (1 in 200).  The patient understands and is willing to proceed.      3. HTN:  BP well controlled currently   4. AKI - Scr stable this morning. Follow closely.   I have spent a total of 35 minutes with patient reviewing echo , telemetry, EKGs, labs and examining patient as well as establishing an assessment and plan that was discussed with the patient.  > 50% of time was spent in direct patient care.    Signed, Armanda Magic, MD  08/14/2016, 1:52 PM

## 2016-08-15 ENCOUNTER — Encounter (HOSPITAL_COMMUNITY)
Admission: EM | Disposition: A | Discharge status: Home / Self Care | Payer: Self-pay | Source: Home / Self Care | Attending: Internal Medicine

## 2016-08-15 DIAGNOSIS — R600 Localized edema: Secondary | ICD-10-CM

## 2016-08-15 DIAGNOSIS — D62 Acute posthemorrhagic anemia: Secondary | ICD-10-CM

## 2016-08-15 HISTORY — PX: RIGHT/LEFT HEART CATH AND CORONARY ANGIOGRAPHY: CATH118266

## 2016-08-15 LAB — CBC
HEMATOCRIT: 36.3 % — AB (ref 39.0–52.0)
HEMOGLOBIN: 10.7 g/dL — AB (ref 13.0–17.0)
MCH: 22.8 pg — AB (ref 26.0–34.0)
MCHC: 29.5 g/dL — AB (ref 30.0–36.0)
MCV: 77.2 fL — AB (ref 78.0–100.0)
Platelets: 424 10*3/uL — ABNORMAL HIGH (ref 150–400)
RBC: 4.7 MIL/uL (ref 4.22–5.81)
RDW: 17.9 % — AB (ref 11.5–15.5)
WBC: 9.5 10*3/uL (ref 4.0–10.5)

## 2016-08-15 LAB — POCT I-STAT 3, ART BLOOD GAS (G3+)
ACID-BASE EXCESS: 5 mmol/L — AB (ref 0.0–2.0)
BICARBONATE: 30.8 mmol/L — AB (ref 20.0–28.0)
O2 SAT: 99 %
TCO2: 32 mmol/L (ref 0–100)
pCO2 arterial: 52.1 mmHg — ABNORMAL HIGH (ref 32.0–48.0)
pH, Arterial: 7.379 (ref 7.350–7.450)
pO2, Arterial: 135 mmHg — ABNORMAL HIGH (ref 83.0–108.0)

## 2016-08-15 LAB — BASIC METABOLIC PANEL
Anion gap: 9 (ref 5–15)
BUN: 15 mg/dL (ref 6–20)
CHLORIDE: 97 mmol/L — AB (ref 101–111)
CO2: 31 mmol/L (ref 22–32)
Calcium: 8.9 mg/dL (ref 8.9–10.3)
Creatinine, Ser: 0.92 mg/dL (ref 0.61–1.24)
GFR calc Af Amer: >60 mL/min (ref >60)
GLUCOSE: 94 mg/dL (ref 65–99)
Potassium: 4.4 mmol/L (ref 3.5–5.1)
Sodium: 137 mmol/L (ref 135–145)

## 2016-08-15 LAB — POCT I-STAT 3, VENOUS BLOOD GAS (G3P V)
ACID-BASE EXCESS: 4 mmol/L — AB (ref 0.0–2.0)
Bicarbonate: 31.1 mmol/L — ABNORMAL HIGH (ref 20.0–28.0)
O2 SAT: 69 %
PO2 VEN: 40 mmHg (ref 32.0–45.0)
TCO2: 33 mmol/L (ref 0–100)
pCO2, Ven: 59 mmHg (ref 44.0–60.0)
pH, Ven: 7.33 (ref 7.250–7.430)

## 2016-08-15 LAB — SURGICAL PCR SCREEN
MRSA, PCR: NEGATIVE
Staphylococcus aureus: NEGATIVE

## 2016-08-15 SURGERY — RIGHT/LEFT HEART CATH AND CORONARY ANGIOGRAPHY
Anesthesia: LOCAL

## 2016-08-15 MED ORDER — LIDOCAINE HCL (PF) 1 % IJ SOLN
INTRAMUSCULAR | Status: AC
  Filled 2016-08-15: qty 30

## 2016-08-15 MED ORDER — IOPAMIDOL (ISOVUE-370) INJECTION 76%
INTRAVENOUS | Status: AC
  Filled 2016-08-15: qty 100

## 2016-08-15 MED ORDER — FENTANYL CITRATE (PF) 100 MCG/2ML IJ SOLN
INTRAMUSCULAR | Status: AC
  Filled 2016-08-15: qty 2

## 2016-08-15 MED ORDER — VERAPAMIL HCL 2.5 MG/ML IV SOLN
INTRAVENOUS | Status: AC
  Filled 2016-08-15: qty 2

## 2016-08-15 MED ORDER — MIDAZOLAM HCL 2 MG/2ML IJ SOLN
INTRAMUSCULAR | Status: DC | PRN
  Administered 2016-08-15: 2 mg via INTRAVENOUS

## 2016-08-15 MED ORDER — HEPARIN (PORCINE) IN NACL 2-0.9 UNIT/ML-% IJ SOLN
INTRAMUSCULAR | Status: DC | PRN
  Administered 2016-08-15: 1000 mL

## 2016-08-15 MED ORDER — HEPARIN SODIUM (PORCINE) 1000 UNIT/ML IJ SOLN
INTRAMUSCULAR | Status: AC
  Filled 2016-08-15: qty 1

## 2016-08-15 MED ORDER — MIDAZOLAM HCL 2 MG/2ML IJ SOLN
INTRAMUSCULAR | Status: AC
  Filled 2016-08-15: qty 2

## 2016-08-15 MED ORDER — FENTANYL CITRATE (PF) 100 MCG/2ML IJ SOLN
INTRAMUSCULAR | Status: DC | PRN
  Administered 2016-08-15: 25 ug via INTRAVENOUS

## 2016-08-15 MED ORDER — VERAPAMIL HCL 2.5 MG/ML IV SOLN
INTRAVENOUS | Status: DC | PRN
  Administered 2016-08-15: 10 mL via INTRA_ARTERIAL

## 2016-08-15 MED ORDER — SODIUM CHLORIDE 0.9% FLUSH: 3.0000 mL | INTRAVENOUS | Status: DC | PRN

## 2016-08-15 MED ORDER — HEPARIN (PORCINE) IN NACL 2-0.9 UNIT/ML-% IJ SOLN
INTRAMUSCULAR | Status: AC
  Filled 2016-08-15: qty 1000

## 2016-08-15 MED ORDER — SODIUM CHLORIDE 0.9 % IV SOLN: 250.0000 mL | INTRAVENOUS | Status: DC | PRN

## 2016-08-15 MED ORDER — IOPAMIDOL (ISOVUE-370) INJECTION 76%
INTRAVENOUS | Status: DC | PRN
  Administered 2016-08-15: 40 mL via INTRA_ARTERIAL

## 2016-08-15 MED ORDER — SODIUM CHLORIDE 0.9% FLUSH
3.0000 mL | Freq: Two times a day (BID) | INTRAVENOUS | Status: DC
  Administered 2016-08-15 – 2016-08-17 (×4): 3 mL via INTRAVENOUS

## 2016-08-15 MED ORDER — ONDANSETRON HCL 4 MG/2ML IJ SOLN: 4.0000 mg | Freq: Four times a day (QID) | INTRAMUSCULAR | Status: DC | PRN

## 2016-08-15 MED ORDER — HEPARIN SODIUM (PORCINE) 1000 UNIT/ML IJ SOLN
INTRAMUSCULAR | Status: DC | PRN
  Administered 2016-08-15: 5000 [IU] via INTRAVENOUS

## 2016-08-15 MED ORDER — ACETAMINOPHEN 325 MG PO TABS: 650.0000 mg | ORAL_TABLET | ORAL | Status: DC | PRN

## 2016-08-15 MED ORDER — LIDOCAINE HCL (PF) 1 % IJ SOLN
INTRAMUSCULAR | Status: DC | PRN
  Administered 2016-08-15 (×2): 2 mL via SUBCUTANEOUS

## 2016-08-15 SURGICAL SUPPLY — 13 items
CATH BALLN WEDGE 5F 110CM (CATHETERS) ×1 IMPLANT
CATH EXPO 5FR FR4 (CATHETERS) ×1 IMPLANT
CATH INFINITI 5 FR JL3.5 (CATHETERS) ×1 IMPLANT
DEVICE RAD COMP TR BAND LRG (VASCULAR PRODUCTS) ×1 IMPLANT
GLIDESHEATH SLEND SS 6F .021 (SHEATH) ×1 IMPLANT
GUIDEWIRE .025 260CM (WIRE) ×1 IMPLANT
GUIDEWIRE INQWIRE 1.5J.035X260 (WIRE) IMPLANT
INQWIRE 1.5J .035X260CM (WIRE) ×2
KIT HEART LEFT (KITS) ×2 IMPLANT
PACK CARDIAC CATHETERIZATION (CUSTOM PROCEDURE TRAY) ×2 IMPLANT
SHEATH FAST CATH BRACH 5F 5CM (SHEATH) ×1 IMPLANT
TRANSDUCER W/STOPCOCK (MISCELLANEOUS) ×2 IMPLANT
TUBING CIL FLEX 10 FLL-RA (TUBING) ×2 IMPLANT

## 2016-08-15 NOTE — Progress Notes (Signed)
TR BAND REMOVAL  LOCATION:    Right radial   DEFLATED PER PROTOCOL:    Yes.    TIME BAND OFF / DRESSING APPLIED:    1730p   SITE UPON ARRIVAL:    Level 0  SITE AFTER BAND REMOVAL:    Level 0  CIRCULATION SENSATION AND MOVEMENT:    Within Normal Limits   Yes.    COMMENTS:   Right radial palpable.  No complaint of    discomfort at this time

## 2016-08-15 NOTE — Interval H&P Note (Signed)
Cath Lab Visit (complete for each Cath Lab visit)  Clinical Evaluation Leading to the Procedure:   ACS: Yes.    Non-ACS:    Anginal Classification: CCS IV  Anti-ischemic medical therapy: Minimal Therapy (1 class of medications)  Non-Invasive Test Results: High-risk stress test findings: cardiac mortality >3%/year  Prior CABG: No previous CABG  Cardiomyopathy    History and Physical Interval Note:  08/15/2016 2:57 PM  Kristopher [name]  has presented today for surgery, with the diagnosis of chf  The various methods of treatment have been discussed with the patient and family. After consideration of risks, benefits and other options for treatment, the patient has consented to  Procedure(s): Right/Left Heart Cath and Coronary Angiography (N/A) as a surgical intervention .  The patient's history has been reviewed, patient examined, no change in status, stable for surgery.  I have reviewed the patient's chart and labs.  Questions were answered to the patient's satisfaction.     Lance Muss

## 2016-08-15 NOTE — H&P (View-Only) (Signed)
Progress Note  Patient Name: Kristopher [name] Date of Encounter: 08/14/2016  Primary Cardiologist: New (seen by Dr. Elberta Fortis for consultation, however will be follow up general cardiologist)  Subjective   Feeling well. No chest pain, sob or palpitations. Had profound hypotensive episode yesterday afternoon with SBP down to after Coreg given earlier that am.   Inpatient Medications    Scheduled Meds: . carvedilol  3.125 mg Oral BID WC  . furosemide  20 mg Oral Daily  . guaiFENesin  600 mg Oral BID  . mometasone-formoterol  2 puff Inhalation BID  . pantoprazole  40 mg Oral BID AC  . sodium chloride flush  3 mL Intravenous Q12H   Continuous Infusions:  PRN Meds: acetaminophen **OR** acetaminophen, ipratropium-albuterol, ondansetron **OR** ondansetron (ZOFRAN) IV, polyethylene glycol   Vital Signs    Vitals:   08/13/16 1616 08/13/16 2305 08/14/16 0602 08/14/16 1306  BP: 120/81 130/73 137/66 119/69  Pulse: 88 97 (!) 106 88  Resp:  17 18 17   Temp:  98.4 F (36.9 C) 98 F (36.7 C) 97.5 F (36.4 C)  TempSrc:  Oral Oral Oral  SpO2:  97% 94% 98%  Weight:      Height:        Intake/Output Summary (Last 24 hours) at 08/14/16 1352 Last data filed at 08/13/16 1900  Gross per 24 hour  Intake              240 ml  Output                0 ml  Net              240 ml   Filed Weights   08/11/16 0051 08/12/16 0216 08/13/16 0523  Weight: 233 lb (105.7 kg) 235 lb (106.6 kg) 233 lb 14.5 oz (106.1 kg)    Telemetry    N/A  ECG    N/A  Physical Exam   GEN: No acute distress.   Neck: No JVD Cardiac: RRR, no murmurs, rubs, or gallops. Trace to 1+ edema bilaterally L > R Respiratory: Clear to auscultation bilaterally. GI: Soft, nontender, non-distended  MS: No edema; No deformity. Neuro:  Nonfocal  Psych: Normal affect   Labs    Chemistry  Recent Labs Lab 08/11/16 0443 08/12/16 0933 08/13/16 0810 08/14/16 0459  NA 138 138 137 142  K 4.5 3.8 4.0 4.7  CL 104  100* 101 104  CO2 26 30 31  32  GLUCOSE 107* 135* 104* 95  BUN 14 15 17 20   CREATININE 1.12 1.28* 1.29* 1.11  CALCIUM 8.9 9.0 8.7* 8.9  PROT 5.7*  --   --   --   ALBUMIN 3.2*  --   --   --   AST 15  --   --   --   ALT 9*  --   --   --   ALKPHOS 51  --   --   --   BILITOT 1.8*  --   --   --   GFRNONAA >60 57* 57* >60  GFRAA >60 >60 >60 >60  ANIONGAP 8 8 5 6      Hematology  Recent Labs Lab 08/13/16 0810 08/13/16 1454 08/14/16 0459  WBC 9.0 9.8 9.4  RBC 4.82 4.52 4.61  HGB 10.8* 10.3* 10.6*  HCT 36.9* 35.3* 36.0*  MCV 76.6* 78.1 78.1  MCH 22.4* 22.8* 23.0*  MCHC 29.3* 29.2* 29.4*  RDW 18.0* 18.0* 18.5*  PLT 482* 455* 428*  Cardiac EnzymesNo results for input(s): TROPONINI in the last 168 hours.   Recent Labs Lab 08/10/16 1425  TROPIPOC 0.01     BNP  Recent Labs Lab 08/10/16 1418  BNP 847.8*     DDimer No results for input(s): DDIMER in the last 168 hours.   Radiology    Ct Angio Chest Pe W Or Wo Contrast  Result Date: 08/12/2016 CLINICAL DATA:  Increasing shortness of breath, chest pain. History of asthma. Hypertension. EXAM: CT ANGIOGRAPHY CHEST WITH CONTRAST TECHNIQUE: Multidetector CT imaging of the chest was performed using the standard protocol during bolus administration of intravenous contrast. Multiplanar CT image reconstructions and MIPs were obtained to evaluate the vascular anatomy. CONTRAST:  80 cc Isovue 370 COMPARISON:  None. FINDINGS: Cardiovascular: There is no pulmonary embolism identified within the main, lobar or segmental pulmonary arteries bilaterally. No aortic aneurysm or secondary evidence of an aortic dissection. Cardiomegaly. No pericardial effusion. Mediastinum/Nodes: Soft tissue density mass within the left paratracheal mediastinum, measuring 2 cm greatest dimension and 1.3 cm short axis dimension (series 4, image 33), likely a mildly prominent lymph node. No additional soft tissue masses or enlarged lymph nodes within the mediastinum.  Esophagus appears normal. Trachea and central bronchi are unremarkable. Lungs/Pleura: Prominent emphysematous blebs within the right lung apex. Milder emphysematous changes at the left lung apex and within the lower lung zones. No pneumonia or edema. No pulmonary nodule or mass seen. No pleural effusion or pneumothorax. Upper Abdomen: Liver contours are slightly nodular throughout suggesting underlying cirrhosis. Small hiatal hernia. Musculoskeletal: Mild degenerative spurring within the thoracic spine. No acute or suspicious osseous finding. Superficial soft tissues are unremarkable. Review of the MIP images confirms the above findings. IMPRESSION: 1. No pulmonary embolism. 2. Emphysema, at least moderate in degree overall, with prominent emphysematous blebs at the right lung apex. 3. **An incidental finding of potential clinical significance has been found. Soft tissue density mass within the left paratracheal mediastinum, measuring 2 cm greatest dimension and 1.3 cm short axis dimension, likely a mildly prominent lymph node, possibly reactive. Recommend follow-up chest CT in 6-12 months to ensure stability. ** 4. Cardiomegaly.  No evidence of active CHF or pulmonary edema. 5. Probable liver cirrhosis. Electronically Signed   By: Bary Richard M.D.   On: 08/12/2016 17:47    Cardiac Studies   Echo: 08/11/2016 LV EF: 20%- 25% Study Conclusions - Left ventricle: The cavity size was mildly dilated. There was moderate concentric hypertrophy. Systolic function was severely reduced. The estimated ejection fraction was in the range of 15-20%. Wall motion was normal; there were no regional wall motion abnormalities. Features are consistent with a pseudonormal left ventricular filling pattern, with concomitant abnormal relaxation and increased filling pressure (grade 2 diastolic dysfunction). Doppler parameters are consistent with elevated ventricular end-diastolic filling pressure. -  Ventricular septum: Septal motion showed paradox. - Mitral valve: There was mild regurgitation. - Left atrium: The atrium was severely dilated. - Right ventricle: The cavity size was moderately dilated. Wall thickness was normal. Systolic function was moderately reduced. - Right atrium: The atrium was mildly dilated. - Tricuspid valve: There was moderate regurgitation. - Pulmonary arteries: Systolic pressure was severely increased. PA peak pressure: 53 mm Hg (S). - Inferior vena cava: The vessel was normal in size. - Pericardium, extracardiac: A trivial pericardial effusion was identified posterior to the heart. Features were not consistent with tamponade physiology. Impressions: - Mildly dilated LV with severely decreased LVEF 15-20% and diffuse hypokinesis. Grade 2 diastolic dysfunction with severely  elevated filling pressures. RV is moderately dilated with moderate RVEF decrease. Severe pulmonary hypertension. No thrombus in the LV. Mild posterior pericardial effusion with no evidence for tamponade.  Patient Profile     CREED KAIL is a 66 y.o. male with a history of treated HTN and asthma who presented to [facility] on 08/10/16 with SOB. He was found to have symptomatic anemia and newly reduced EF and cardiology consulted.   Assessment & Plan     1. Acute blood loss anemia: Hg 7.5 --> 10.7 after 2U PRBCs. EGD on 08/11/16 showed 2 gastric ulcers as well as esophagitis. He was placed on PPI.  Discontinued NSAIDS.  2. Newly reduced EF: 2D ECHO on 08/11/16 showed EF 15-20% with diffuse HK, G2DD with elevated filling pressures, mod RV dilation, mod decreased RVEF, severe pulmonary HTN, and mild posterior pericardial effusion with no evidence of tamponade. He is currently feeling better in terms of SOB s/p transfusion and IV lasix. He had severe swelling in (L>R) leg that has improved with Lasix.   - CTA without PE or CHF. However did showed emphysematous changes and  prominent lymph note. Per primary team. LE doppler without DVT.   - Up titrated CHF medication yesterday. He was eating lot of salt at home and his BP "always runs high at home".  Unfortunately after his first lisinopril dose yesterday am, he developed profound hypotension with SBP with HR in the 40's.  ? Whether this was medication related or vagal.  Will hold ACE I for now and continue BB with parameters since he has had some tachycardia as well.  - I have recommended proceeding with diagnostic right and left heart cath to define coronary anatomy and filling pressures on right and left side.  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  I have discussed the case with Dr. Eldridge Dace who will be doing the cath tomorrow.  Once the diagnostic study is done will discuss further intervention if needed (in light of gastric ulcers) prior to proceeding with any PCI.  Cardiac catheterization was discussed with the patient fully. The patient understands that risks include but are not limited to stroke (1 in 1000), death (1 in 1000), kidney failure [usually temporary] (1 in 500), bleeding (1 in 200), allergic reaction [possibly serious] (1 in 200).  The patient understands and is willing to proceed.      3. HTN:  BP well controlled currently   4. AKI - Scr stable this morning. Follow closely.   I have spent a total of 35 minutes with patient reviewing echo , telemetry, EKGs, labs and examining patient as well as establishing an assessment and plan that was discussed with the patient.  > 50% of time was spent in direct patient care.    Signed, Armanda Magic, MD  08/14/2016, 1:52 PM

## 2016-08-15 NOTE — Progress Notes (Signed)
Progress Note    Kristopher [name]  ZOX:[id number] DOB: 02-01-1951  DOA: 08/10/2016 PCP: Frederich Chick, MD    Brief Narrative:   Chief complaint: Follow-up GI bleeding and CHF  Pt. 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 EGD shows gastric ulcer, Hb remained stable. Currently further plan is further workup of systolic dysfunction.  Assessment/Plan:   Principal problem:  Acute upper GI bleed S/P EGD, 2 nonbleeding gastric ulcers identified as well as esophagitis. Avoid NSAIDs. GI recommends to also use only 81 mg of aspirin for next few weeks given his gastric ulcers. H. pylori workup negative. Stable H&H. Continue PPI twice a day. Gastroenterology signed off, patient will need a follow-up with Dr. Dulce Sellar for repeat endoscopy in 3 months.  Active problems:  Dyspnea on exertion/Acute combined diastolic and systolic CHF/Pulmonary hypertension based on echocardiogram/RV dysfunction/Pedal edema 2 D echogram shows EF of 15% with diffuse hypokinesis as well as cardiomegaly. No prior echocardiogram to compare. Initially was receiving IV Lasix. Stared him on Coreg 3.125 mg twice a day and lisinopril 5 mg daily. Dose increased by cardiology and patient unable to tolerate, was significantly hypotensive with symptoms of diaphoresis, dizziness. Received IV fluid bolus. Now on low-dose Coreg and lisinopril with holding parameters and Lasix 20 mg daily. Underwent heart catheterization 08/15/16. Diffuse mildly obstructive CAD noted. Continue medical therapy. GI only recommends use of 81 mg aspirin for the next few weeks. LDL 89. Due to tachycardia and pedal edema cardiology ordered a CTPE protocol, that was negative for PE and also lower extremity Doppler was negative for PE.  Chronic LBBB. No evidence of ACS.  Abnormal lymph node  enlargement. CT scan shows one solitary lymph node which was 2 cm in diameter. Recommend repeat CT scan in 6 months.  Obesity Body mass index is 33.04 kg/m.   Family Communication/Anticipated D/C date and plan/Code Status   DVT prophylaxis: SCDs ordered. Code Status: Full Code.  Family Communication: Family updated at bedside. Disposition Plan: Likely home tomorrow.   Medical Consultants:    Cardiology  Gastroenterology   Procedures:    Cardiac cath 08/15/16  EGD 08/11/16  Anti-Infectives:    None  Subjective:   Denies chest pain, dyspnea. No melena or hematemesis. No nausea or vomiting.   Objective:    Vitals:   08/14/16 2223 08/14/16 2338 08/15/16 0439 08/15/16 0624  BP: 125/74   (!) 144/84  Pulse: 93   (!) 103  Resp: 20   20  Temp:    97.8 F (36.6 C)  TempSrc:    Oral  SpO2: 99%   96%  Weight:  95.6 kg (210 lb 12.8 oz) 95.7 kg (210 lb 15.7 oz)   Height:        Intake/Output Summary (Last 24 hours) at 08/15/16 1256 Last data filed at 08/15/16 0900  Gross per 24 hour  Intake                0 ml  Output              600 ml  Net             -600 ml   Filed Weights   08/13/16 0523 08/14/16 2338 08/15/16 0439  Weight: 106.1 kg (233 lb 14.5 oz) 95.6 kg (210 lb 12.8  oz) 95.7 kg (210 lb 15.7 oz)    Exam: General exam: Appears calm and comfortable.  Respiratory system: Clear to auscultation. Respiratory effort normal. Cardiovascular system: S1 & S2 heard, RRR. No JVD,  rubs, gallops or clicks. No murmurs. Gastrointestinal system: Abdomen is nondistended, soft and nontender. No organomegaly or masses felt. Normal bowel sounds heard. Central nervous system: Alert and oriented. No focal neurological deficits. Extremities: No clubbing,  or cyanosis. No edema. TEDs hose on. Skin: No rashes, lesions or ulcers. Psychiatry: Judgement and insight appear normal. Mood & affect appropriate.   Data Reviewed:   I have personally reviewed following labs and  imaging studies:  Labs: Basic Metabolic Panel:  Recent Labs Lab 08/11/16 0443 08/12/16 0933 08/13/16 0810 08/14/16 0459 08/15/16 0712  NA 138 138 137 142 137  K 4.5 3.8 4.0 4.7 4.4  CL 104 100* 101 104 97*  CO2 26 30 31  32 31  GLUCOSE 107* 135* 104* 95 94  BUN 14 15 17 20 15   CREATININE 1.12 1.28* 1.29* 1.11 0.92  CALCIUM 8.9 9.0 8.7* 8.9 8.9  MG 2.6* 2.3 2.3  --   --    GFR Estimated Creatinine Clearance: 88.2 mL/min (by C-G formula based on SCr of 0.92 mg/dL). Liver Function Tests:  Recent Labs Lab 08/11/16 0443  AST 15  ALT 9*  ALKPHOS 51  BILITOT 1.8*  PROT 5.7*  ALBUMIN 3.2*   Coagulation profile  Recent Labs Lab 08/10/16 1647  INR 1.24    CBC:  Recent Labs Lab 08/12/16 0933 08/13/16 0810 08/13/16 1454 08/14/16 0459 08/15/16 0712  WBC 8.5 9.0 9.8 9.4 9.5  HGB 10.7* 10.8* 10.3* 10.6* 10.7*  HCT 36.9* 36.9* 35.3* 36.0* 36.3*  MCV 77.7* 76.6* 78.1 78.1 77.2*  PLT 454* 482* 455* 428* 424*   CBG:  Recent Labs Lab 08/13/16 1430  GLUCAP 133*    Microbiology Recent Results (from the past 240 hour(s))  Surgical PCR screen     Status: None   Collection Time: 08/14/16 11:35 PM  Result Value Ref Range Status   MRSA, PCR NEGATIVE NEGATIVE Final   Staphylococcus aureus NEGATIVE NEGATIVE Final    Comment:        The Xpert SA Assay (FDA approved for NASAL specimens in patients over 8 years of age), is one component of a comprehensive surveillance program.  Test performance has been validated by [facility] for patients greater than or equal to 53 year old. It is not intended to diagnose infection nor to guide or monitor treatment.     Radiology: No results found.  Medications:   . carvedilol  3.125 mg Oral BID WC  . furosemide  20 mg Oral Daily  . guaiFENesin  600 mg Oral BID  . mometasone-formoterol  2 puff Inhalation BID  . pantoprazole  40 mg Oral BID AC  . sodium chloride flush  3 mL Intravenous Q12H  . sodium chloride  flush  3 mL Intravenous Q12H   Continuous Infusions: . sodium chloride 10 mL/hr at 08/15/16 0518    Medical decision making is of high complexity and this patient is at high risk of deterioration, therefore this is a level 3 visit.  (> 4 problem points, >4 data points, high risk)    LOS: 4 days   Henretter Piekarski  [facility] Pager [phone]. If unable to reach me by pager, please call my cell phone at [phone].  *Please refer to [email or web address], password [email or web address] to get updated schedule on who will round  on this patient, as hospitalists switch teams weekly. If 7PM-7AM, please contact night-coverage at [email or web address], password [email or web address] for any overnight needs.  08/15/2016, 12:56 PM

## 2016-08-16 ENCOUNTER — Encounter (HOSPITAL_COMMUNITY): Payer: Self-pay | Admitting: Interventional Cardiology

## 2016-08-16 DIAGNOSIS — I2510 Atherosclerotic heart disease of native coronary artery without angina pectoris: Secondary | ICD-10-CM

## 2016-08-16 DIAGNOSIS — D649 Anemia, unspecified: Secondary | ICD-10-CM | POA: Diagnosis present

## 2016-08-16 HISTORY — DX: Atherosclerotic heart disease of native coronary artery without angina pectoris: I25.10

## 2016-08-16 LAB — BASIC METABOLIC PANEL
ANION GAP: 6 (ref 5–15)
BUN: 12 mg/dL (ref 6–20)
CALCIUM: 8.7 mg/dL — AB (ref 8.9–10.3)
CO2: 30 mmol/L (ref 22–32)
CREATININE: 1.08 mg/dL (ref 0.61–1.24)
Chloride: 101 mmol/L (ref 101–111)
GFR calc Af Amer: >60 mL/min (ref >60)
GLUCOSE: 100 mg/dL — AB (ref 65–99)
Potassium: 4.3 mmol/L (ref 3.5–5.1)
Sodium: 137 mmol/L (ref 135–145)

## 2016-08-16 LAB — CBC
HCT: 36.6 % — ABNORMAL LOW (ref 39.0–52.0)
HEMOGLOBIN: 10.6 g/dL — AB (ref 13.0–17.0)
MCH: 22.3 pg — AB (ref 26.0–34.0)
MCHC: 29 g/dL — AB (ref 30.0–36.0)
MCV: 77.1 fL — ABNORMAL LOW (ref 78.0–100.0)
PLATELETS: 409 10*3/uL — AB (ref 150–400)
RBC: 4.75 MIL/uL (ref 4.22–5.81)
RDW: 17.8 % — AB (ref 11.5–15.5)
WBC: 8.3 10*3/uL (ref 4.0–10.5)

## 2016-08-16 MED ORDER — FUROSEMIDE 20 MG PO TABS: 20.0000 mg | ORAL_TABLET | Freq: Every day | ORAL | Status: DC

## 2016-08-16 MED ORDER — LOSARTAN POTASSIUM 25 MG PO TABS
12.5000 mg | ORAL_TABLET | Freq: Every day | ORAL | Status: DC
  Filled 2016-08-16 (×2): qty 0.5

## 2016-08-16 MED ORDER — LIVING BETTER WITH HEART FAILURE BOOK
Freq: Once | Status: AC
  Administered 2016-08-16: 15:00:00

## 2016-08-16 NOTE — Progress Notes (Addendum)
Progress Note    Kristopher [name]  ZOX:[id number] DOB: 06-02-1951  DOA: 08/10/2016 PCP: Frederich Chick, MD    Brief Narrative:   Chief complaint: Follow-up GI bleeding and CHF  Pt. 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 EGD shows gastric ulcer, Hb remained stable. Currently further plan is further workup of systolic dysfunction.  Assessment/Plan:   Principal problem:  Acute upper GI bleed S/P EGD, 2 nonbleeding gastric ulcers identified as well as esophagitis. Avoid NSAIDs. GI recommends to also use only 81 mg of aspirin for next few weeks given his gastric ulcers. H. pylori workup negative. Stable H&H. Continue PPI twice a day. Gastroenterology signed off, patient will need a follow-up with Dr. Dulce Sellar for repeat endoscopy in 3 months.  Active problems:  Dyspnea on exertion/Acute combined diastolic and systolic CHF/Pulmonary hypertension based on echocardiogram/RV dysfunction/Pedal edema 2 D echogram shows EF of 15% with diffuse hypokinesis as well as cardiomegaly. No prior echocardiogram to compare. Initially was receiving IV Lasix. Started him on Coreg 3.125 mg twice a day and lisinopril 5 mg daily. Dose increased by cardiology and patient unable to tolerate, was significantly hypotensive with symptoms of diaphoresis, dizziness. Received IV fluid bolus. Now on low-dose Coreg and lisinopril with holding parameters and Lasix 20 mg daily. Underwent heart catheterization 08/15/16. Diffuse mildly obstructive CAD noted. Continue medical therapy. GI only recommends use of 81 mg aspirin for the next few weeks. LDL 89. Due to tachycardia and pedal edema cardiology ordered a CTPE protocol, that was negative for PE and also lower extremity Doppler was negative for PE. Cardiologist is optimizing medical therapy prior to  discharge.  Chronic LBBB. No evidence of ACS.  Abnormal lymph node enlargement. CT scan shows one solitary lymph node which was 2 cm in diameter. Recommend repeat CT scan in 6 months.  Obesity Body mass index is 32.97 kg/m.   Family Communication/Anticipated D/C date and plan/Code Status   DVT prophylaxis: SCDs ordered. Code Status: Full Code.  Family Communication: Family updated at bedside. Disposition Plan: Likely home tomorrow.   Medical Consultants:    Cardiology  Gastroenterology   Procedures:    Cardiac cath 08/15/16  EGD 08/11/16  Anti-Infectives:    None  Subjective:   Denies chest pain, dyspnea. No melena or hematemesis. No nausea or vomiting. Feels well.  Objective:    Vitals:   08/16/16 0228 08/16/16 0605 08/16/16 0758 08/16/16 0854  BP:  (!) 125/57  133/78  Pulse:  (!) 111  (!) 101  Resp:  18    Temp:  98.1 F (36.7 C)    TempSrc:  Oral    SpO2:  97% 97%   Weight: 95.5 kg (210 lb 8 oz)     Height:        Intake/Output Summary (Last 24 hours) at 08/16/16 0900 Last data filed at 08/16/16 8119  Gross per 24 hour  Intake              222 ml  Output              300 ml  Net              -78 ml   Filed Weights   08/14/16 2338 08/15/16 0439 08/16/16 0228  Weight: 95.6 kg (210 lb 12.8 oz) 95.7  kg (210 lb 15.7 oz) 95.5 kg (210 lb 8 oz)    Exam: General exam: Appears calm and comfortable.  Respiratory system: Clear to auscultation. Respiratory effort normal. Cardiovascular system: S1 & S2 heard, Tachycardic. No JVD,  rubs, gallops or clicks. No murmurs. Gastrointestinal system: Abdomen is nondistended, soft and nontender. No organomegaly or masses felt. Normal bowel sounds heard. Central nervous system: Alert and oriented. No focal neurological deficits. Extremities: No clubbing,  or cyanosis. No edema. TEDs hose on. Skin: No rashes, lesions or ulcers. Psychiatry: Judgement and insight appear normal. Mood & affect appropriate.   Data  Reviewed:   I have personally reviewed following labs and imaging studies:  Labs: Basic Metabolic Panel:  Recent Labs Lab 08/11/16 0443 08/12/16 0933 08/13/16 0810 08/14/16 0459 08/15/16 0712 08/16/16 0728  NA 138 138 137 142 137 137  K 4.5 3.8 4.0 4.7 4.4 4.3  CL 104 100* 101 104 97* 101  CO2 26 30 31  32 31 30  GLUCOSE 107* 135* 104* 95 94 100*  BUN 14 15 17 20 15 12   CREATININE 1.12 1.28* 1.29* 1.11 0.92 1.08  CALCIUM 8.9 9.0 8.7* 8.9 8.9 8.7*  MG 2.6* 2.3 2.3  --   --   --    GFR Estimated Creatinine Clearance: 75.1 mL/min (by C-G formula based on SCr of 1.08 mg/dL). Liver Function Tests:  Recent Labs Lab 08/11/16 0443  AST 15  ALT 9*  ALKPHOS 51  BILITOT 1.8*  PROT 5.7*  ALBUMIN 3.2*   Coagulation profile  Recent Labs Lab 08/10/16 1647  INR 1.24    CBC:  Recent Labs Lab 08/13/16 0810 08/13/16 1454 08/14/16 0459 08/15/16 0712 08/16/16 0728  WBC 9.0 9.8 9.4 9.5 8.3  HGB 10.8* 10.3* 10.6* 10.7* 10.6*  HCT 36.9* 35.3* 36.0* 36.3* 36.6*  MCV 76.6* 78.1 78.1 77.2* 77.1*  PLT 482* 455* 428* 424* 409*   CBG:  Recent Labs Lab 08/13/16 1430  GLUCAP 133*    Microbiology Recent Results (from the past 240 hour(s))  Surgical PCR screen     Status: None   Collection Time: 08/14/16 11:35 PM  Result Value Ref Range Status   MRSA, PCR NEGATIVE NEGATIVE Final   Staphylococcus aureus NEGATIVE NEGATIVE Final    Comment:        The Xpert SA Assay (FDA approved for NASAL specimens in patients over 75 years of age), is one component of a comprehensive surveillance program.  Test performance has been validated by [facility] for patients greater than or equal to 67 year old. It is not intended to diagnose infection nor to guide or monitor treatment.     Radiology: No results found.  Medications:   . carvedilol  3.125 mg Oral BID WC  . furosemide  20 mg Oral Daily  . guaiFENesin  600 mg Oral BID  . losartan  12.5 mg Oral Daily  .  mometasone-formoterol  2 puff Inhalation BID  . pantoprazole  40 mg Oral BID AC  . sodium chloride flush  3 mL Intravenous Q12H  . sodium chloride flush  3 mL Intravenous Q12H   Continuous Infusions:   Medical decision making is of high complexity and this patient is at high risk of deterioration, therefore this is a level 2 visit.  (> 4 problem points, 2 data points, mod risk)    LOS: 5 days   RAMA,CHRISTINA  [facility] Pager [phone]. If unable to reach me by pager, please call my cell phone at  [phone].  *Please refer to [email or web address], password [email or web address] to get updated schedule on who will round on this patient, as hospitalists switch teams weekly. If 7PM-7AM, please contact night-coverage at [email or web address], password [email or web address] for any overnight needs.  08/16/2016, 9:00 AM

## 2016-08-16 NOTE — Progress Notes (Addendum)
Progress Note  Patient Name: Kristopher [name] Date of Encounter: 08/16/2016  Primary Cardiologist: New (seen by Dr. Elberta Fortis for consultation, however will be follow up general cardiologist)  Subjective   Feeling well. No chest pain, sob or palpitations.  Inpatient Medications    Scheduled Meds: . carvedilol  3.125 mg Oral BID WC  . furosemide  20 mg Oral Daily  . guaiFENesin  600 mg Oral BID  . mometasone-formoterol  2 puff Inhalation BID  . pantoprazole  40 mg Oral BID AC  . sodium chloride flush  3 mL Intravenous Q12H  . sodium chloride flush  3 mL Intravenous Q12H   Continuous Infusions:  PRN Meds: sodium chloride, acetaminophen, ipratropium-albuterol, ondansetron (ZOFRAN) IV, ondansetron **OR** [DISCONTINUED] ondansetron (ZOFRAN) IV, polyethylene glycol, sodium chloride flush   Vital Signs    Vitals:   08/15/16 2117 08/16/16 0228 08/16/16 0605 08/16/16 0758  BP: 123/66  (!) 125/57   Pulse: 98  (!) 111   Resp: 18  18   Temp: 98.2 F (36.8 C)  98.1 F (36.7 C)   TempSrc: Oral  Oral   SpO2: 96%  97% 97%  Weight:  210 lb 8 oz (95.5 kg)    Height:        Intake/Output Summary (Last 24 hours) at 08/16/16 0836 Last data filed at 08/15/16 1750  Gross per 24 hour  Intake                0 ml  Output              300 ml  Net             -300 ml   Filed Weights   08/14/16 2338 08/15/16 0439 08/16/16 0228  Weight: 210 lb 12.8 oz (95.6 kg) 210 lb 15.7 oz (95.7 kg) 210 lb 8 oz (95.5 kg)    Telemetry    N/A  ECG    N/A  Physical Exam   GEN: No acute distress.   Neck: No JVD Cardiac: RRR, no murmurs, rubs, or gallops. Trace to 1+ edema bilaterally L > R Respiratory: Clear to auscultation bilaterally. GI: Soft, nontender, non-distended  MS: No edema; No deformity. Neuro:  Nonfocal  Psych: Normal affect   Labs    Chemistry  Recent Labs Lab 08/11/16 0443  08/14/16 0459 08/15/16 0712 08/16/16 0728  NA 138  < > 142 137 137  K 4.5  < > 4.7 4.4 4.3  CL  104  < > 104 97* 101  CO2 26  < > 32 31 30  GLUCOSE 107*  < > 95 94 100*  BUN 14  < > 20 15 12   CREATININE 1.12  < > 1.11 0.92 1.08  CALCIUM 8.9  < > 8.9 8.9 8.7*  PROT 5.7*  --   --   --   --   ALBUMIN 3.2*  --   --   --   --   AST 15  --   --   --   --   ALT 9*  --   --   --   --   ALKPHOS 51  --   --   --   --   BILITOT 1.8*  --   --   --   --   GFRNONAA >60  < > >60 >60 >60  GFRAA >60  < > >60 >60 >60  ANIONGAP 8  < > 6 9 6   < > =  values in this interval not displayed.   Hematology  Recent Labs Lab 08/14/16 0459 08/15/16 0712 08/16/16 0728  WBC 9.4 9.5 8.3  RBC 4.61 4.70 4.75  HGB 10.6* 10.7* 10.6*  HCT 36.0* 36.3* 36.6*  MCV 78.1 77.2* 77.1*  MCH 23.0* 22.8* 22.3*  MCHC 29.4* 29.5* 29.0*  RDW 18.5* 17.9* 17.8*  PLT 428* 424* 409*    Cardiac EnzymesNo results for input(s): TROPONINI in the last 168 hours.   Recent Labs Lab 08/10/16 1425  TROPIPOC 0.01     BNP  Recent Labs Lab 08/10/16 1418  BNP 847.8*     DDimer No results for input(s): DDIMER in the last 168 hours.   Radiology    No results found.  Cardiac Studies   Echo: 08/11/2016 LV EF: 20%- 25% Study Conclusions - Left ventricle: The cavity size was mildly dilated. There was moderate concentric hypertrophy. Systolic function was severely reduced. The estimated ejection fraction was in the range of 15-20%. Wall motion was normal; there were no regional wall motion abnormalities. Features are consistent with a pseudonormal left ventricular filling pattern, with concomitant abnormal relaxation and increased filling pressure (grade 2 diastolic dysfunction). Doppler parameters are consistent with elevated ventricular end-diastolic filling pressure. - Ventricular septum: Septal motion showed paradox. - Mitral valve: There was mild regurgitation. - Left atrium: The atrium was severely dilated. - Right ventricle: The cavity size was moderately dilated. Wall thickness was  normal. Systolic function was moderately reduced. - Right atrium: The atrium was mildly dilated. - Tricuspid valve: There was moderate regurgitation. - Pulmonary arteries: Systolic pressure was severely increased. PA peak pressure: 53 mm Hg (S). - Inferior vena cava: The vessel was normal in size. - Pericardium, extracardiac: A trivial pericardial effusion was identified posterior to the heart. Features were not consistent with tamponade physiology. Impressions: - Mildly dilated LV with severely decreased LVEF 15-20% and diffuse hypokinesis. Grade 2 diastolic dysfunction with severely elevated filling pressures. RV is moderately dilated with moderate RVEF decrease. Severe pulmonary hypertension. No thrombus in the LV. Mild posterior pericardial effusion with no evidence for tamponade.  Cath Conclusion     LV end diastolic pressure is moderately elevated.  There is no aortic valve stenosis.  Hemodynamic findings consistent with moderate pulmonary hypertension.  Mild, nonobstructive coronary artery disease diffusely.  PA sat 69%. CO 6.3 L/min. CI 3.0.   Continue medical therapy for his nonischemic cardiomyopathy.     Patient Profile     Kristopher [name] is a 66 y.o. male with a history of treated HTN and asthma who presented to [facility] on 08/10/16 with SOB. He was found to have symptomatic anemia and newly reduced EF and cardiology consulted.   Assessment & Plan     1. Acute blood loss anemia: Hg 7.5 --> 10.7 after 2U PRBCs. EGD on 08/11/16 showed 2 gastric ulcers as well as esophagitis. He was placed on PPI.  Discontinued NSAIDS.  2. Nonischemic DCM: 2D ECHO on 08/11/16 showed EF 15-20% with diffuse HK, G2DD with elevated filling pressures, mod RV dilation, mod decreased RVEF, severe pulmonary HTN, and mild posterior pericardial effusion with no evidence of tamponade. He is currently feeling better in terms of SOB s/p transfusion and IV lasix. He had severe  swelling in (L>R) leg that has improved with Lasix. Cath showed minimal nonobstructive CAD with moderate pulmonary HTN and moderately elevated LVEDP at .  Etiology of DCM unknown - ? HTN  3.  Acute combined systolic/diastolic CHF with EF on echo  15-20% - Continue Lasix 20mg  daily.   - add Losartan 12.5mg  daily and follow BP.  If BP remains stable will continue to titrate ARB.  - continue coreg low dose.  Would not increase further until ARB has been increased further.  He did have a drop in BP the other day but this also occurred with drop in HR so ? Whether this was a vagal episode of related to meds.  Will titrate HF meds slowly  3. HTN:  BP well controlled currently   4. AKI - Scr stable this morning. Follow closely.    Signed, Armanda Magic, MD  08/16/2016, 8:36 AM

## 2016-08-17 ENCOUNTER — Encounter (HOSPITAL_COMMUNITY): Payer: Self-pay | Admitting: Internal Medicine

## 2016-08-17 DIAGNOSIS — I2510 Atherosclerotic heart disease of native coronary artery without angina pectoris: Secondary | ICD-10-CM

## 2016-08-17 DIAGNOSIS — K209 Esophagitis, unspecified without bleeding: Secondary | ICD-10-CM

## 2016-08-17 DIAGNOSIS — K279 Peptic ulcer, site unspecified, unspecified as acute or chronic, without hemorrhage or perforation: Secondary | ICD-10-CM

## 2016-08-17 DIAGNOSIS — I5041 Acute combined systolic (congestive) and diastolic (congestive) heart failure: Secondary | ICD-10-CM

## 2016-08-17 DIAGNOSIS — R591 Generalized enlarged lymph nodes: Secondary | ICD-10-CM

## 2016-08-17 DIAGNOSIS — I272 Pulmonary hypertension, unspecified: Secondary | ICD-10-CM | POA: Diagnosis present

## 2016-08-17 HISTORY — DX: Esophagitis, unspecified without bleeding: K20.90

## 2016-08-17 HISTORY — DX: Pulmonary hypertension, unspecified: I27.20

## 2016-08-17 HISTORY — DX: Generalized enlarged lymph nodes: R59.1

## 2016-08-17 HISTORY — DX: Peptic ulcer, site unspecified, unspecified as acute or chronic, without hemorrhage or perforation: K27.9

## 2016-08-17 LAB — BASIC METABOLIC PANEL
ANION GAP: 7 (ref 5–15)
BUN: 10 mg/dL (ref 6–20)
CHLORIDE: 100 mmol/L — AB (ref 101–111)
CO2: 29 mmol/L (ref 22–32)
CREATININE: 0.95 mg/dL (ref 0.61–1.24)
Calcium: 9 mg/dL (ref 8.9–10.3)
GFR calc non Af Amer: >60 mL/min (ref >60)
Glucose, Bld: 119 mg/dL — ABNORMAL HIGH (ref 65–99)
POTASSIUM: 4.4 mmol/L (ref 3.5–5.1)
SODIUM: 136 mmol/L (ref 135–145)

## 2016-08-17 LAB — CBC
HCT: 36.7 % — ABNORMAL LOW (ref 39.0–52.0)
HEMOGLOBIN: 10.6 g/dL — AB (ref 13.0–17.0)
MCH: 22.2 pg — AB (ref 26.0–34.0)
MCHC: 28.9 g/dL — ABNORMAL LOW (ref 30.0–36.0)
MCV: 76.8 fL — AB (ref 78.0–100.0)
PLATELETS: 404 10*3/uL — AB (ref 150–400)
RBC: 4.78 MIL/uL (ref 4.22–5.81)
RDW: 17.4 % — ABNORMAL HIGH (ref 11.5–15.5)
WBC: 10 10*3/uL (ref 4.0–10.5)

## 2016-08-17 MED ORDER — FUROSEMIDE 20 MG PO TABS: 20.0000 mg | ORAL_TABLET | Freq: Every day | ORAL | 3 refills | Status: DC

## 2016-08-17 MED ORDER — LOSARTAN POTASSIUM 25 MG PO TABS: 25.0000 mg | ORAL_TABLET | Freq: Every day | ORAL | 3 refills | Status: DC

## 2016-08-17 MED ORDER — SPIRONOLACTONE 25 MG PO TABS
12.5000 mg | ORAL_TABLET | Freq: Every day | ORAL | Status: DC
  Administered 2016-08-17: 12.5 mg via ORAL
  Filled 2016-08-17: qty 1

## 2016-08-17 MED ORDER — LOSARTAN POTASSIUM 50 MG PO TABS
25.0000 mg | ORAL_TABLET | Freq: Every day | ORAL | Status: DC
  Administered 2016-08-17: 25 mg via ORAL

## 2016-08-17 MED ORDER — PANTOPRAZOLE SODIUM 40 MG PO TBEC: 40.0000 mg | DELAYED_RELEASE_TABLET | Freq: Two times a day (BID) | ORAL | 1 refills | Status: DC

## 2016-08-17 MED ORDER — SPIRONOLACTONE 25 MG PO TABS: 12.5000 mg | ORAL_TABLET | Freq: Every day | ORAL | 2 refills | Status: DC

## 2016-08-17 MED ORDER — BUDESONIDE-FORMOTEROL FUMARATE 80-4.5 MCG/ACT IN AERO: 1.0000 | INHALATION_SPRAY | Freq: Two times a day (BID) | RESPIRATORY_TRACT | 12 refills | Status: DC

## 2016-08-17 MED ORDER — CARVEDILOL 3.125 MG PO TABS: 3.1250 mg | ORAL_TABLET | Freq: Two times a day (BID) | ORAL | 3 refills | Status: DC

## 2016-08-17 NOTE — Progress Notes (Signed)
Discharge instructions and Rx reviewed with pt verb understanding. Pt left via wheelchair with son and other family member at side. NAD noted on pt discharge.

## 2016-08-17 NOTE — Progress Notes (Addendum)
Progress Note  Patient Name: Kristopher [name] Date of Encounter: 08/17/2016  Primary Cardiologist: New (seen by Dr. Elberta Fortis for consultation, however will be follow up general cardiologist)  Subjective   Feeling well. No chest pain, sob or palpitations.  Wants to go home  Inpatient Medications    Scheduled Meds: . carvedilol  3.125 mg Oral BID WC  . furosemide  20 mg Oral Daily  . guaiFENesin  600 mg Oral BID  . losartan  12.5 mg Oral Daily  . mometasone-formoterol  2 puff Inhalation BID  . pantoprazole  40 mg Oral BID AC  . sodium chloride flush  3 mL Intravenous Q12H  . sodium chloride flush  3 mL Intravenous Q12H   Continuous Infusions:  PRN Meds: sodium chloride, acetaminophen, ipratropium-albuterol, ondansetron (ZOFRAN) IV, ondansetron **OR** [DISCONTINUED] ondansetron (ZOFRAN) IV, polyethylene glycol, sodium chloride flush   Vital Signs    Vitals:   08/16/16 2210 08/17/16 0538 08/17/16 0618 08/17/16 0907  BP: 107/69  (!) 149/84   Pulse: 97  (!) 106   Resp: 20  18   Temp: 98.4 F (36.9 C)  98.9 F (37.2 C)   TempSrc: Oral     SpO2: 98%  100% 94%  Weight:  208 lb 3.2 oz (94.4 kg)    Height:        Intake/Output Summary (Last 24 hours) at 08/17/16 0925 Last data filed at 08/16/16 0951  Gross per 24 hour  Intake              300 ml  Output                0 ml  Net              300 ml   Filed Weights   08/15/16 0439 08/16/16 0228 08/17/16 0538  Weight: 210 lb 15.7 oz (95.7 kg) 210 lb 8 oz (95.5 kg) 208 lb 3.2 oz (94.4 kg)    Telemetry    N/A - not on tele  ECG    N/A - no EKG done  Physical Exam   GEN: No acute distress.   Neck: No JVD Cardiac: RRR, no murmurs, rubs, or gallops. Trace to 1+ edema bilaterally L > R Respiratory: Clear to auscultation bilaterally. GI: Soft, nontender, non-distended  MS: No edema; No deformity. Neuro:  Nonfocal  Psych: Normal affect   Labs    Chemistry  Recent Labs Lab 08/11/16 0443  08/14/16 0459  08/15/16 0712 08/16/16 0728  NA 138  < > 142 137 137  K 4.5  < > 4.7 4.4 4.3  CL 104  < > 104 97* 101  CO2 26  < > 32 31 30  GLUCOSE 107*  < > 95 94 100*  BUN 14  < > 20 15 12   CREATININE 1.12  < > 1.11 0.92 1.08  CALCIUM 8.9  < > 8.9 8.9 8.7*  PROT 5.7*  --   --   --   --   ALBUMIN 3.2*  --   --   --   --   AST 15  --   --   --   --   ALT 9*  --   --   --   --   ALKPHOS 51  --   --   --   --   BILITOT 1.8*  --   --   --   --   GFRNONAA >60  < > >60 >60 >60  GFRAA >60  < > >60 >60 >60  ANIONGAP 8  < > 6 9 6   < > = values in this interval not displayed.   Hematology  Recent Labs Lab 08/14/16 0459 08/15/16 0712 08/16/16 0728  WBC 9.4 9.5 8.3  RBC 4.61 4.70 4.75  HGB 10.6* 10.7* 10.6*  HCT 36.0* 36.3* 36.6*  MCV 78.1 77.2* 77.1*  MCH 23.0* 22.8* 22.3*  MCHC 29.4* 29.5* 29.0*  RDW 18.5* 17.9* 17.8*  PLT 428* 424* 409*    Cardiac EnzymesNo results for input(s): TROPONINI in the last 168 hours.   Recent Labs Lab 08/10/16 1425  TROPIPOC 0.01     BNP  Recent Labs Lab 08/10/16 1418  BNP 847.8*     DDimer No results for input(s): DDIMER in the last 168 hours.   Radiology    No results found.  Cardiac Studies   Echo: 08/11/2016 LV EF: 20%- 25% Study Conclusions - Left ventricle: The cavity size was mildly dilated. There was moderate concentric hypertrophy. Systolic function was severely reduced. The estimated ejection fraction was in the range of 15-20%. Wall motion was normal; there were no regional wall motion abnormalities. Features are consistent with a pseudonormal left ventricular filling pattern, with concomitant abnormal relaxation and increased filling pressure (grade 2 diastolic dysfunction). Doppler parameters are consistent with elevated ventricular end-diastolic filling pressure. - Ventricular septum: Septal motion showed paradox. - Mitral valve: There was mild regurgitation. - Left atrium: The atrium was severely  dilated. - Right ventricle: The cavity size was moderately dilated. Wall thickness was normal. Systolic function was moderately reduced. - Right atrium: The atrium was mildly dilated. - Tricuspid valve: There was moderate regurgitation. - Pulmonary arteries: Systolic pressure was severely increased. PA peak pressure: 53 mm Hg (S). - Inferior vena cava: The vessel was normal in size. - Pericardium, extracardiac: A trivial pericardial effusion was identified posterior to the heart. Features were not consistent with tamponade physiology. Impressions: - Mildly dilated LV with severely decreased LVEF 15-20% and diffuse hypokinesis. Grade 2 diastolic dysfunction with severely elevated filling pressures. RV is moderately dilated with moderate RVEF decrease. Severe pulmonary hypertension. No thrombus in the LV. Mild posterior pericardial effusion with no evidence for tamponade.  Cath Conclusion     LV end diastolic pressure is moderately elevated.  There is no aortic valve stenosis.  Hemodynamic findings consistent with moderate pulmonary hypertension.  Mild, nonobstructive coronary artery disease diffusely.  PA sat 69%. CO 6.3 L/min. CI 3.0.   Continue medical therapy for his nonischemic cardiomyopathy.     Patient Profile     Kristopher [name] is a 66 y.o. male with a history of treated HTN and asthma who presented to [facility] on 08/10/16 with SOB. He was found to have symptomatic anemia and newly reduced EF and cardiology consulted.   Assessment & Plan     1. Acute blood loss anemia: Hg 7.5 --> 10.7-->10.6 after 2U PRBCs. EGD on 08/11/16 showed 2 gastric ulcers as well as esophagitis. He was placed on PPI.  Discontinued NSAIDS.  2. Nonischemic DCM: 2D ECHO on 08/11/16 showed EF 15-20% with diffuse HK, G2DD with elevated filling pressures, mod RV dilation, mod decreased RVEF, severe pulmonary HTN, and mild posterior pericardial effusion with no evidence of  tamponade. He is currently feeling better in terms of SOB s/p transfusion and IV lasix. He had severe swelling in (L>R) leg that has improved with Lasix. Cath showed minimal nonobstructive CAD (luminal irreg) with moderate pulmonary HTN and moderately  elevated LVEDP at .  Etiology of DCM unknown - ? HTN.  -  Will need repeat echo in 2 months to see if LVF has improved on medical therapy and if not then refer to EP for AICD for primary prevention.  3.  Acute combined systolic/diastolic CHF with EF on echo 15-20% - Continue Lasix 20mg  daily.  Weight is down 2lbs.  Net neg 583cc and does not appear volume overloaded on exam.   - added Losartan 12.5mg  daily and BP stable and actually high today.  Will increase Losartan to 25mg  daily. - continue coreg low dose.  Would not increase further until ARB has been maximized.   - add aldactone 12.5mg  daily.  Follow renal function and K closely.  Will need repeat BMET in 1 week in our office.   3. HTN:  BP well controlled throughout the day yesterday but elevated today. -  Increase Losartan to 25mg  daily.   4. AKI - resolved - Scr stable this morning. Follow closely.   5.  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  Should improve with diuresis and HF meds.  He is stable from a cardiac standpoint for discharge.  He will need followup in our office in 1 week with BMET at that time.    Signed, Armanda Magic, MD  08/17/2016, 9:25 AM

## 2016-08-17 NOTE — Discharge Instructions (Signed)
Gastrointestinal Bleeding °Gastrointestinal (GI) bleeding is bleeding somewhere along the digestive tract, between the mouth and anus. This can be caused by various problems. The severity of these problems can range from mild to serious or even life-threatening. If you have GI bleeding, you may find blood in your stools (feces), you may have black stools, or you may vomit blood. If there is a lot of bleeding, you may need to stay in the hospital. °What are the causes? °This condition may be caused by: °· Esophagitis. This is inflammation, irritation, or swelling of the esophagus. °· Hemorrhoids. These are swollen veins in the rectum. °· Anal fissures. These are areas of painful tearing that are often caused by passing hard stool. °· Diverticulosis. These are pouches that form on the colon over time, with age, and may bleed a lot. °· Diverticulitis. This is inflammation in areas with diverticulosis. It can cause pain, fever, and bloody stools, although bleeding may be mild. °· Polyps and cancer. Colon cancer often starts out as precancerous polyps. °· Gastritis and ulcers. With these, bleeding may come from the upper GI tract, near the stomach. °What are the signs or symptoms? °Symptoms of this condition may include: °· Bright red blood in your vomit, or vomit that looks like coffee grounds. °· Bloody, black, or tarry stools. °¨ Bleeding from the lower GI tract will usually cause red or maroon blood in the stools. °¨ Bleeding from the upper GI tract may cause black, tarry, often bad-smelling stools. °¨ In certain cases, if the bleeding is fast enough, the stools may be red. °· Pain or cramping in the abdomen. °How is this diagnosed? °This condition may be diagnosed based on: °· Medical history and physical exam. °· Various tests, such as: °¨ Blood tests. °¨ X-rays and other imaging tests. °¨ Esophagogastroduodenoscopy (EGD). In this test, a flexible, lighted tube is used to look at your esophagus, stomach, and small  intestine. °¨ Colonoscopy. In this test, a flexible, lighted tube is used to look at your colon. °How is this treated? °Treatment for this condition depends on the cause of the bleeding. For example: °· For bleeding from the esophagus, stomach, small intestine, or colon, the health care provider doing your EGD or colonoscopy may be able to stop the bleeding as part of the procedure. °· Inflammation or infection of the colon can be treated with medicines. °· Certain rectal problems can be treated with creams, suppositories, or warm baths. °· Surgery is sometimes needed. °· Blood transfusions are sometimes needed if a lot of blood has been lost. °If bleeding is slow, you may be allowed to go home. If there is a lot of bleeding, you will need to stay in the hospital for observation. °Follow these instructions at home: °· Take over-the-counter and prescription medicines only as told by your health care provider. °· Eat foods that are high in fiber. This will help to keep your stools soft. These foods include whole grains, legumes, fruits, and vegetables. Eating 1-3 prunes each day works well for many people. °· Drink enough fluid to keep your urine clear or pale yellow. °· Keep all follow-up visits as told by your health care provider. This is important. °Contact a health care provider if: °· Your symptoms do not improve. °Get help right away if: °· Your bleeding increases. °· You feel light-headed or you faint. °· You feel weak. °· You have severe cramps in your back or abdomen. °· You pass large blood clots in your stool. °· Your symptoms are   getting worse. °This information is not intended to replace advice given to you by your health care provider. Make sure you discuss any questions you have with your health care provider. °Document Released: 05/25/2000 Document Revised: 10/26/2015 Document Reviewed: 11/15/2014 °Elsevier Interactive Patient Education © 2017 [employer] ° °

## 2016-08-17 NOTE — Discharge Summary (Signed)
Physician Discharge Summary  Caster Fayette Gordner WUJ:[id number] DOB: 09/10/1950 DOA: 08/10/2016  PCP: Frederich Chick, MD  Admit date: 08/10/2016 Discharge date: 08/17/2016  Admitted From: Home Discharge disposition: Home   Recommendations for Outpatient Follow-Up:   1. Needs repeat EGD in 3 months. 2. Needs F/U with cardiologist in 1 week with a repeat BMET. 3. CT scan shows one solitary lymph node which was 2 cm in diameter. Recommend repeat CT scan in 6 months.   Discharge Diagnosis:   Principal Problem:    Acute upper GI bleed Active Problems:    DOE (dyspnea on exertion)    Pedal edema    LBBB (left bundle branch block)    Acute combined systolic and diastolic heart failure (HCC)    Acute blood loss anemia    HTN (hypertension)    DCM (dilated cardiomyopathy) (HCC)    Anemia    CAD (coronary artery disease)    PUD (peptic ulcer disease)    Acute esophagitis    Pulmonary HTN    Lymphadenopathy   Discharge Condition: Improved.  Diet recommendation: Low sodium, heart healthy.   History of Present Illness:   Pt. 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 EGD shows gastric ulcer, Hb remained stable. Currently further plan is further workup of systolic dysfunction.  Hospital Course by Problem:   Principal problem:  Acute upper GI bleed S/P EGD, 2 nonbleeding gastric ulcers identified as well as esophagitis. Avoid NSAIDs.GI recommends to also use only 81 mg of aspirin for next few weeks given his gastric ulcers. H. pylori workup negative. Stable H&H.Continue PPI twice a day. Gastroenterology signed off, patient will need a follow-up with Dr. Dulce Sellar for repeat endoscopy in 3 months.  Active problems:  Dyspnea on exertion/Acute combined diastolic and systolic CHF/Pulmonary hypertension  based on echocardiogram/RV dysfunction/Pedal edema 2 D echogram shows EF of 15% with diffuse hypokinesis as well as cardiomegaly. No prior echocardiogram to compare. Initially was receiving IV Lasix. Started him on Coreg 3.125 mg twice a day and lisinopril 5 mg daily. Dose increased by cardiology and patient unable to tolerate, was significantly hypotensive with symptoms of diaphoresis, dizziness. Received IV fluid bolus. Now on low-dose Coreg and lisinopril with holding parameters and Lasix 20 mg daily. Underwent heart catheterization 08/15/16. Diffuse mildly obstructive CAD noted. Continue medical therapy. GI only recommends use of 81 mg aspirin for the next few weeks. LDL 89. Due to tachycardia and pedal edema cardiology ordered a CTPE protocol, that was negative for PE and also lower extremity Doppler was negative for PE. Cardiologist is optimizing medical therapy prior to discharge.  Chronic LBBB. No evidence of ACS.  Abnormal lymph node enlargement. CT scan shows one solitary lymph node which was 2 cm in diameter. Recommend repeat CT scan in 6 months.  Obesity Body mass index is 32.61 kg/m.    Medical Consultants:    Cardiology  Gastroenterology   Discharge Exam:   Vitals:   08/16/16 2210 08/17/16 0618  BP: 107/69 (!) 149/84  Pulse: 97 (!) 106  Resp: 20 18  Temp: 98.4 F (36.9 C) 98.9 F (37.2 C)   Vitals:   08/16/16 2210 08/17/16 0538 08/17/16 0618 08/17/16 0907  BP: 107/69  (!) 149/84   Pulse: 97  (!) 106   Resp: 20  18   Temp:  98.4 F (36.9 C)  98.9 F (37.2 C)   TempSrc: Oral     SpO2: 98%  100% 94%  Weight:  94.4 kg (208 lb 3.2 oz)    Height:        General exam: Appears calm and comfortable.  Respiratory system: Clear to auscultation. Respiratory effort normal. Cardiovascular system: S1 & S2 heard, RRR. No JVD,  rubs, gallops or clicks. No murmurs. Gastrointestinal system: Abdomen is nondistended, soft and nontender. No organomegaly or masses felt.  Normal bowel sounds heard. Central nervous system: Alert and oriented. No focal neurological deficits. Extremities: No clubbing,  or cyanosis. No edema. Skin: No rashes, lesions or ulcers. Psychiatry: Judgement and insight appear normal. Mood & affect appropriate.    The results of significant diagnostics from this hospitalization (including imaging, microbiology, ancillary and laboratory) are listed below for reference.     Procedures and Diagnostic Studies:   Dg Chest 2 View  Result Date: 08/10/2016 CLINICAL DATA:  Cough and short of breath EXAM: CHEST  2 VIEW COMPARISON:  07/12/2010 FINDINGS: Cardiac enlargement. Negative for heart failure. Lungs are clear without infiltrate effusion or mass. Degenerative changes in the shoulder joints bilaterally. IMPRESSION: No active cardiopulmonary disease. Electronically Signed   By: Marlan Palau M.D.   On: 08/10/2016 14:40     Labs:   Basic Metabolic Panel:  Recent Labs Lab 08/11/16 0443 08/12/16 0933 08/13/16 0810 08/14/16 0459 08/15/16 0712 08/16/16 0728 08/17/16 0855  NA 138 138 137 142 137 137 136  K 4.5 3.8 4.0 4.7 4.4 4.3 4.4  CL 104 100* 101 104 97* 101 100*  CO2 26 30 31  32 31 30 29   GLUCOSE 107* 135* 104* 95 94 100* 119*  BUN 14 15 17 20 15 12 10   CREATININE 1.12 1.28* 1.29* 1.11 0.92 1.08 0.95  CALCIUM 8.9 9.0 8.7* 8.9 8.9 8.7* 9.0  MG 2.6* 2.3 2.3  --   --   --   --    GFR Estimated Creatinine Clearance: 84.9 mL/min (by C-G formula based on SCr of 0.95 mg/dL). Liver Function Tests:  Recent Labs Lab 08/11/16 0443  AST 15  ALT 9*  ALKPHOS 51  BILITOT 1.8*  PROT 5.7*  ALBUMIN 3.2*   No results for input(s): LIPASE, AMYLASE in the last 168 hours. No results for input(s): AMMONIA in the last 168 hours. Coagulation profile  Recent Labs Lab 08/10/16 1647  INR 1.24    CBC:  Recent Labs Lab 08/13/16 1454 08/14/16 0459 08/15/16 0712 08/16/16 0728 08/17/16 0855  WBC 9.8 9.4 9.5 8.3 10.0  HGB 10.3*  10.6* 10.7* 10.6* 10.6*  HCT 35.3* 36.0* 36.3* 36.6* 36.7*  MCV 78.1 78.1 77.2* 77.1* 76.8*  PLT 455* 428* 424* 409* 404*   Cardiac Enzymes: No results for input(s): CKTOTAL, CKMB, CKMBINDEX, TROPONINI in the last 168 hours. BNP: Invalid input(s): POCBNP CBG:  Recent Labs Lab 08/13/16 1430  GLUCAP 133*   D-Dimer No results for input(s): DDIMER in the last 72 hours. Hgb A1c No results for input(s): HGBA1C in the last 72 hours. Lipid Profile No results for input(s): CHOL, HDL, LDLCALC, TRIG, CHOLHDL, LDLDIRECT in the last 72 hours. Thyroid function studies No results for input(s): TSH, T4TOTAL, T3FREE, THYROIDAB in the last 72 hours.  Invalid input(s): FREET3 Anemia work up No results for input(s): VITAMINB12, FOLATE, FERRITIN, TIBC, IRON, RETICCTPCT in the last 72 hours. Microbiology Recent Results (from the past 240 hour(s))  Surgical PCR screen     Status: None  Collection Time: 08/14/16 11:35 PM  Result Value Ref Range Status   MRSA, PCR NEGATIVE NEGATIVE Final   Staphylococcus aureus NEGATIVE NEGATIVE Final    Comment:        The Xpert SA Assay (FDA approved for NASAL specimens in patients over 49 years of age), is one component of a comprehensive surveillance program.  Test performance has been validated by [facility] for patients greater than or equal to 34 year old. It is not intended to diagnose infection nor to guide or monitor treatment.      Discharge Instructions:   Discharge Instructions    (HEART FAILURE PATIENTS) Call MD:  Anytime you have any of the following symptoms: 1) 3 pound weight gain in 24 hours or 5 pounds in 1 week 2) shortness of breath, with or without a dry hacking cough 3) swelling in the hands, feet or stomach 4) if you have to sleep on extra pillows at night in order to breathe.    Complete by:  As directed    Call MD for:  extreme fatigue    Complete by:  As directed    Call MD for:  persistant dizziness or light-headedness     Complete by:  As directed    Call MD for:  persistant nausea and vomiting    Complete by:  As directed    Diet - low sodium heart healthy    Complete by:  As directed    Discharge instructions    Complete by:  As directed    You were treated for heart failure in the hospital.  To prevent exacerbations of your heart failure, it is important that you check your weight at the same time every day, and that if you gain over 3 pounds in 24 hours or 5 lbs in 1 week, OR you develop worsening swelling to the legs, experience more shortness of breath or chest pain, take an extra dose of Lasix and call your Primary MD or cardiologist.   Follow a heart healthy, low salt diet and restrict your fluid intake to 1.5 liters a day or less.   Increase activity slowly    Complete by:  As directed      Allergies as of 08/17/2016      Reactions   Penicillins    Pt reports it makes him feel shaky Has patient had a PCN reaction causing immediate rash, facial/tongue/throat swelling, SOB or lightheadedness with hypotension: YES Has patient had a PCN reaction causing severe rash involving mucus membranes or skin necrosis: NO Has patient had a PCN reaction that required hospitalization NO Has patient had a PCN reaction occurring within the last 10 years: NO If all of the above answers are "NO", then may proceed with Cephalosporin use.      Medication List    TAKE these medications   albuterol 108 (90 Base) MCG/ACT inhaler Commonly known as:  PROVENTIL HFA;VENTOLIN HFA Inhale 2 puffs into the lungs every 6 (six) hours as needed for wheezing or shortness of breath.   budesonide-formoterol 80-4.5 MCG/ACT inhaler Commonly known as:  SYMBICORT Inhale 1 puff into the lungs 2 (two) times daily. What changed:  when to take this   carvedilol 3.125 MG tablet Commonly known as:  COREG Take 1 tablet (3.125 mg total) by mouth 2 (two) times daily with a meal.   cyclobenzaprine 10 MG tablet Commonly known as:   FLEXERIL Take 10 mg by mouth 3 (three) times daily as needed for muscle spasms.  furosemide 20 MG tablet Commonly known as:  LASIX Take 1 tablet (20 mg total) by mouth daily. Start taking on:  08/18/2016   losartan 25 MG tablet Commonly known as:  COZAAR Take 1 tablet (25 mg total) by mouth daily. Start taking on:  08/18/2016   meloxicam 15 MG tablet Commonly known as:  MOBIC Take 15 mg by mouth daily.   pantoprazole 40 MG tablet Commonly known as:  PROTONIX Take 1 tablet (40 mg total) by mouth 2 (two) times daily before a meal.   spironolactone 25 MG tablet Commonly known as:  ALDACTONE Take 0.5 tablets (12.5 mg total) by mouth daily. Start taking on:  08/18/2016      Follow-up Information    [facility]. Schedule an appointment as soon as possible for a visit in 1 week(s).   Why:  Hospital follow up. Contact information: [address] [place] [address] [phone]       Frederich Chick, MD. Schedule an appointment as soon as possible for a visit in 1 week(s).   Specialty:  Family Medicine Why:  Hospital follow up. Contact information: [address] [place] [address] [phone]        [name], MD. Schedule an appointment as soon as possible for a visit in 4 week(s).   Specialty:  Gastroenterology Why:  Hospital follow up. Contact information: [address] [place] [address] [phone]            Time coordinating discharge: 35 minutes.  Signed:  RAMA,CHRISTINA  Pager [phone] [facility] 08/17/2016, 11:43 AM

## 2016-08-28 ENCOUNTER — Encounter: Payer: Self-pay | Admitting: *Deleted

## 2016-08-28 ENCOUNTER — Ambulatory Visit (INDEPENDENT_AMBULATORY_CARE_PROVIDER_SITE_OTHER): Payer: BC Managed Care – PPO | Admitting: Cardiology

## 2016-08-28 ENCOUNTER — Encounter: Payer: Self-pay | Admitting: Cardiology

## 2016-08-28 ENCOUNTER — Encounter (INDEPENDENT_AMBULATORY_CARE_PROVIDER_SITE_OTHER): Payer: Self-pay

## 2016-08-28 VITALS — BP 130/70 | HR 97 | Ht 67.0 in | Wt 219.8 lb

## 2016-08-28 DIAGNOSIS — I10 Essential (primary) hypertension: Secondary | ICD-10-CM | POA: Diagnosis not present

## 2016-08-28 DIAGNOSIS — D638 Anemia in other chronic diseases classified elsewhere: Secondary | ICD-10-CM

## 2016-08-28 DIAGNOSIS — I5022 Chronic systolic (congestive) heart failure: Secondary | ICD-10-CM | POA: Diagnosis not present

## 2016-08-28 DIAGNOSIS — I428 Other cardiomyopathies: Secondary | ICD-10-CM

## 2016-08-28 NOTE — Patient Instructions (Signed)
Medication Instructions:  The current medical regimen is effective;  continue present plan and medications.  Labwork: Please have blood work today (CBC, BMP)  Follow-Up: Follow up in 3 weeks in the Hypertension Clinic.  Follow up in 6 weeks with Dr Mayford Knife.  If you need a refill on your cardiac medications before your next appointment, please call your pharmacy.  Thank you for choosing [facility]!!     Low-Sodium Eating Plan Sodium, which is an element that makes up salt, helps you maintain a healthy balance of fluids in your body. Too much sodium can increase your blood pressure and cause fluid and waste to be held in your body. Your health care provider or dietitian may recommend following this plan if you have high blood pressure (hypertension), kidney disease, liver disease, or heart failure. Eating less sodium can help lower your blood pressure, reduce swelling, and protect your heart, liver, and kidneys. What are tips for following this plan? General guidelines   Most people on this plan should limit their sodium intake to 1,500-2,000 mg (milligrams) of sodium each day. Reading food labels   The Nutrition Facts label lists the amount of sodium in one serving of the food. If you eat more than one serving, you must multiply the listed amount of sodium by the number of servings.  Choose foods with less than 140 mg of sodium per serving.  Avoid foods with 300 mg of sodium or more per serving. Shopping   Look for lower-sodium products, often labeled as "low-sodium" or "no salt added."  Always check the sodium content even if foods are labeled as "unsalted" or "no salt added".  Buy fresh foods.  Avoid canned foods and premade or frozen meals.  Avoid canned, cured, or processed meats  Buy breads that have less than 80 mg of sodium per slice. Cooking   Eat more home-cooked food and less restaurant, buffet, and fast food.  Avoid adding salt when cooking. Use  salt-free seasonings or herbs instead of table salt or sea salt. Check with your health care provider or pharmacist before using salt substitutes.  Cook with plant-based oils, such as canola, sunflower, or olive oil. Meal planning   When eating at a restaurant, ask that your food be prepared with less salt or no salt, if possible.  Avoid foods that contain MSG (monosodium glutamate). MSG is sometimes added to [place] food, bouillon, and some canned foods. What foods are recommended? The items listed may not be a complete list. Talk with your dietitian about what dietary choices are best for you. Grains  Low-sodium cereals, including oats, puffed wheat and rice, and shredded wheat. Low-sodium crackers. Unsalted rice. Unsalted pasta. Low-sodium bread. Whole-grain breads and whole-grain pasta. Vegetables  Fresh or frozen vegetables. "No salt added" canned vegetables. "No salt added" tomato sauce and paste. Low-sodium or reduced-sodium tomato and vegetable juice. Fruits  Fresh, frozen, or canned fruit. Fruit juice. Meats and other protein foods  Fresh or frozen (no salt added) meat, poultry, seafood, and fish. Low-sodium canned tuna and salmon. Unsalted nuts. Dried peas, beans, and lentils without added salt. Unsalted canned beans. Eggs. Unsalted nut butters. Dairy  Milk. Soy milk. Cheese that is naturally low in sodium, such as ricotta cheese, fresh mozzarella, or Swiss cheese Low-sodium or reduced-sodium cheese. Cream cheese. Yogurt. Fats and oils  Unsalted butter. Unsalted margarine with no trans fat. Vegetable oils such as canola or olive oils. Seasonings and other foods  Fresh and dried herbs and spices.  Salt-free seasonings. Low-sodium mustard and ketchup. Sodium-free salad dressing. Sodium-free light mayonnaise. Fresh or refrigerated horseradish. Lemon juice. Vinegar. Homemade, reduced-sodium, or low-sodium soups. Unsalted popcorn and pretzels. Low-salt or salt-free chips. What foods are  not recommended? The items listed may not be a complete list. Talk with your dietitian about what dietary choices are best for you. Grains  Instant hot cereals. Bread stuffing, pancake, and biscuit mixes. Croutons. Seasoned rice or pasta mixes. Noodle soup cups. Boxed or frozen macaroni and cheese. Regular salted crackers. Self-rising flour. Vegetables  Sauerkraut, pickled vegetables, and relishes. Olives. [place] fries. Onion rings. Regular canned vegetables (not low-sodium or reduced-sodium). Regular canned tomato sauce and paste (not low-sodium or reduced-sodium). Regular tomato and vegetable juice (not low-sodium or reduced-sodium). Frozen vegetables in sauces. Meats and other protein foods  Meat or fish that is salted, canned, smoked, spiced, or pickled. Bacon, ham, sausage, hotdogs, corned beef, chipped beef, packaged lunch meats, salt pork, jerky, pickled herring, anchovies, regular canned tuna, sardines, salted nuts. Dairy  Processed cheese and cheese spreads. Cheese curds. Blue cheese. Feta cheese. String cheese. Regular cottage cheese. Buttermilk. Canned milk. Fats and oils  Salted butter. Regular margarine. Ghee. Bacon fat. Seasonings and other foods  Onion salt, garlic salt, seasoned salt, table salt, and sea salt. Canned and packaged gravies. Worcestershire sauce. Tartar sauce. Barbecue sauce. Teriyaki sauce. Soy sauce, including reduced-sodium. Steak sauce. Fish sauce. Oyster sauce. Cocktail sauce. Horseradish that you find on the shelf. Regular ketchup and mustard. Meat flavorings and tenderizers. Bouillon cubes. Hot sauce and Tabasco sauce. Premade or packaged marinades. Premade or packaged taco seasonings. Relishes. Regular salad dressings. Salsa. Potato and tortilla chips. Corn chips and puffs. Salted popcorn and pretzels. Canned or dried soups. Pizza. Frozen entrees and pot pies. Summary  Eating less sodium can help lower your blood pressure, reduce swelling, and protect your heart,  liver, and kidneys.  Most people on this plan should limit their sodium intake to 1,500-2,000 mg (milligrams) of sodium each day.  Canned, boxed, and frozen foods are high in sodium. Restaurant foods, fast foods, and pizza are also very high in sodium. You also get sodium by adding salt to food.  Try to cook at home, eat more fresh fruits and vegetables, and eat less fast food, canned, processed, or prepared foods. This information is not intended to replace advice given to you by your health care provider. Make sure you discuss any questions you have with your health care provider. Document Released: 11/17/2001 Document Revised: 05/21/2016 Document Reviewed: 05/21/2016 Elsevier Interactive Patient Education  2017 [employer].  Please weigh daily and keep a log of it.  Please report if you gain 3 lbs in a 24 hr period or 5 lbs in a week.  Avoid the use of NSAIDS like Ibuprofen/Aleve.  Thank you for choosing [facility]!!

## 2016-08-28 NOTE — Progress Notes (Signed)
08/28/2016 Kristopher [name]   1950-09-12  [id number]  Primary Physician Frederich Chick, MD Primary Cardiologist: Dr. Mayford Knife    Reason for Visit/CC: [facility] f/u for Systolic HF + Anemia   HPI:  Kristopher [name] presents to clinic today for post hospital f/u after recent admission to [facility]. He is a 66 y/o AAM with a h/o HTN and asthma, who presented to [facility] on 08/10/16 with dyspnea and was found to have anemia and newly reduced EF, for which cardiology was consulted. He was found to have acute blood loss anemia 2/2 GIB. He was transfused x2 units. EGD showed 2 gastric ulcers as well as esophagitis. This was in the setting of chronic NSAID + ASA use. He was placed on a PPI and all NSAIDs were discontinued.  He was also found to have an NICM. 2D echo showed reduced LVEF at 15-20%. Subsequent LHC showed minimal nonobstructive CAD (luminal irreg) with moderate pulmonary HTN and moderately elevated LVEDP at . Etiology of DCM unknown - ? HTN. He was placed on BB and ARB therapy as well as Aldactone.   He presents to clinic today for post hospital f/u. He has been doing well. He denies any recurrent dyspnea. No chest pain. He has discontinued all NSAIDs. He denies melena. No fatigue, dizziness, syncope/ near syncope. He has been limiting his salt intake. He does not yet have a scale at home, thus not tracking weight daily. He plans to invest in one.   Current Meds  Medication Sig  . budesonide-formoterol (SYMBICORT) 80-4.5 MCG/ACT inhaler Inhale 1 puff into the lungs 2 (two) times daily.  . carvedilol (COREG) 3.125 MG tablet Take 1 tablet (3.125 mg total) by mouth 2 (two) times daily with a meal.  . furosemide (LASIX) 20 MG tablet Take 1 tablet (20 mg total) by mouth daily.  Marland Kitchen losartan (COZAAR) 25 MG tablet Take 1 tablet (25 mg total) by mouth daily.  . pantoprazole (PROTONIX) 40 MG tablet Take 1 tablet (40 mg total) by mouth 2 (two) times daily before a meal.  . spironolactone (ALDACTONE) 25 MG tablet  Take 0.5 tablets (12.5 mg total) by mouth daily.   Allergies  Allergen Reactions  . Penicillins     Pt reports it makes him feel shaky Has patient had a PCN reaction causing immediate rash, facial/tongue/throat swelling, SOB or lightheadedness with hypotension: YES Has patient had a PCN reaction causing severe rash involving mucus membranes or skin necrosis: NO Has patient had a PCN reaction that required hospitalization NO Has patient had a PCN reaction occurring within the last 10 years: NO If all of the above answers are "NO", then may proceed with Cephalosporin use.   Past Medical History:  Diagnosis Date  . Acute combined systolic and diastolic heart failure (HCC) 08/11/2016  . Acute esophagitis 08/17/2016  . Acute upper GI bleed 08/10/2016  . Asthma   . CAD (coronary artery disease) 08/16/2016  . DCM (dilated cardiomyopathy) (HCC)   . HTN (hypertension)   . LBBB (left bundle branch block) 08/10/2016  . Lymphadenopathy 08/17/2016  . PUD (peptic ulcer disease) 08/17/2016  . Pulmonary HTN 08/17/2016   Family History  Problem Relation Age of Onset  . Hypertension Mother   . Hypertension Sister    Past Surgical History:  Procedure Laterality Date  . ESOPHAGOGASTRODUODENOSCOPY (EGD) WITH PROPOFOL Left 08/11/2016   Procedure: ESOPHAGOGASTRODUODENOSCOPY (EGD) WITH PROPOFOL;  Surgeon: Willis Modena, MD;  Location[facility] ENDOSCOPY;  Service: Endoscopy;  Laterality: Left;  .  RIGHT/LEFT HEART CATH AND CORONARY ANGIOGRAPHY N/A 08/15/2016   Procedure: Right/Left Heart Cath and Coronary Angiography;  Surgeon: Corky Crafts, MD;  Location: [facility] INVASIVE CV LAB;  Service: Cardiovascular;  Laterality: N/A;   Social History   Social History  . Marital status: Single    Spouse name: N/A  . Number of children: N/A  . Years of education: N/A   Occupational History  . Not on file.   Social History Main Topics  . Smoking status: Former Games developer  . Smokeless tobacco: Never Used  . Alcohol use No  . Drug  use: No  . Sexual activity: Not on file   Other Topics Concern  . Not on file   Social History Narrative  . No narrative on file     Review of Systems: General: negative for chills, fever, night sweats or weight changes.  Cardiovascular: negative for chest pain, dyspnea on exertion, edema, orthopnea, palpitations, paroxysmal nocturnal dyspnea or shortness of breath Dermatological: negative for rash Respiratory: negative for cough or wheezing Urologic: negative for hematuria Abdominal: negative for nausea, vomiting, diarrhea, bright red blood per rectum, melena, or hematemesis Neurologic: negative for visual changes, syncope, or dizziness All other systems reviewed and are otherwise negative except as noted above.   Physical Exam:  Blood pressure 130/70, pulse 97, height 5\' 7"  (1.702 m), weight 219 lb 12.8 oz (99.7 kg).  General appearance: alert, cooperative, no distress and dentition in poor repair Neck: no carotid bruit and no JVD Lungs: clear to auscultation bilaterally Heart: regular rate and rhythm, S1, S2 normal, no murmur, click, rub or gallop Extremities: extremities normal, atraumatic, no cyanosis or edema Pulses: 2+ and symmetric Skin: Skin color, texture, turgor normal. No rashes or lesions Neurologic: Grossly normal  EKG not performed   ASSESSMENT AND PLAN:   1. NICM/Systolic HF: newly diagnosed. 2D echo showed reduced LVEF at 15-20%. Cath showed nonobstructive CAD. ? Etiology of DCM--- possibly HTN. Continue medical therapy with Coreg and Losartan + Aldactone. Also continue lasix for volume control. We will check BMP today. If stable renal function and K, we will have patient increase Losartan further to 50 mg daily. He will require further titration of meds as BP and HR allows. F/u with clinic pharmacist in 3 weeks for further medication titration for HF and HTN. Repeat 2D echo once on maximum dose of medical therapy. If EF remains < 35%, he will need referral to EP  for consideration for ICD. We discussed importance of low sodium diet + daily weights.   2. Anemia: acute blood loss anemia 2/2 GIB from 2 gastric ulcers, in the setting of chronic NASAID use, which has been discontinued. He is on PPI therapy with Protonix. No fatigue, dyspnea or melena. We will recheck a CBC today to ensure H/H is stable.   3. HTN: controlled, but he will need further titration of his losartan for systolic HF. Increase further to 50 mg daily if stable BMP. Further increase in 2-3 weeks after f/u with pharmacist +/- further increase in BB.     PLAN  F/u with pharmacist in 2-3 weeks for medication titration of HF/BP meds. F/u with Dr. Mayford Knife in 6 weeks.   Robbie Lis PA-C 08/28/2016 12:34 PM

## 2016-08-29 LAB — BASIC METABOLIC PANEL
BUN / CREAT RATIO: 12 (ref 10–24)
BUN: 12 mg/dL (ref 8–27)
CO2: 30 mmol/L — ABNORMAL HIGH (ref 18–29)
Calcium: 9.2 mg/dL (ref 8.6–10.2)
Chloride: 99 mmol/L (ref 96–106)
Creatinine, Ser: 1.01 mg/dL (ref 0.76–1.27)
GFR calc Af Amer: 90 mL/min/{1.73_m2} (ref >59)
GFR calc non Af Amer: 78 mL/min/{1.73_m2} (ref >59)
Glucose: 92 mg/dL (ref 65–99)
Potassium: 4.6 mmol/L (ref 3.5–5.2)
SODIUM: 144 mmol/L (ref 134–144)

## 2016-08-29 LAB — CBC
Hematocrit: 34.4 % — ABNORMAL LOW (ref 37.5–51.0)
Hemoglobin: 10.2 g/dL — ABNORMAL LOW (ref 13.0–17.7)
MCH: 22.2 pg — ABNORMAL LOW (ref 26.6–33.0)
MCHC: 29.7 g/dL — ABNORMAL LOW (ref 31.5–35.7)
MCV: 75 fL — AB (ref 79–97)
PLATELETS: 400 10*3/uL — AB (ref 150–379)
RBC: 4.59 x10E6/uL (ref 4.14–5.80)
RDW: 17.8 % — AB (ref 12.3–15.4)
WBC: 7.1 10*3/uL (ref 3.4–10.8)

## 2016-08-30 ENCOUNTER — Telehealth: Payer: Self-pay | Admitting: *Deleted

## 2016-08-30 MED ORDER — LOSARTAN POTASSIUM 50 MG PO TABS: 50.0000 mg | ORAL_TABLET | Freq: Every day | ORAL | 1 refills | Status: DC

## 2016-08-30 NOTE — Telephone Encounter (Signed)
-----   Message from Allayne Butcher, [place] sent at 08/29/2016  2:41 PM EDT ----- Hemoglobin level is stable. No signs of recurrent bleeding/ worsening anemia. Kidney function is ok as well and potassium level. Instruct patient to increase his losartan to 50 mg daily.

## 2016-09-20 ENCOUNTER — Ambulatory Visit (INDEPENDENT_AMBULATORY_CARE_PROVIDER_SITE_OTHER): Payer: BC Managed Care – PPO | Admitting: Pharmacist

## 2016-09-20 VITALS — BP 122/78 | HR 106

## 2016-09-20 DIAGNOSIS — I10 Essential (primary) hypertension: Secondary | ICD-10-CM | POA: Diagnosis not present

## 2016-09-20 MED ORDER — CARVEDILOL 6.25 MG PO TABS: 6.2500 mg | ORAL_TABLET | Freq: Two times a day (BID) | ORAL | 1 refills | Status: DC

## 2016-09-20 NOTE — Progress Notes (Signed)
Patient ID: Kristopher [name]                 DOB: 11-27-1950                      MRN: [id number]     HPI: Kristopher [name] is a 66 y.o. male patient of Dr. Mayford Knife who presents today for hypertension evaluation/HF medication management. PMH includes HTN and asthma, who presented to [facility] on 08/10/16 with dyspnea and was found to have anemia and newly reduced EF, for which cardiology was consulted. EF was found to be 15-20%. He also was found to have GI bleed likely secondary to chronic NSAID use. He was started on BB, ARB and aldactone. He is here for HF medication titration.   He states that he has felt very well since his hospital discharge. He still is unfamiliar with medication names but he is able to tell me how he is taking them and the description of the tablets.    Current HTN meds:  Spironolactone 12.5mg  daily Losartan 50mg  daily Furosemide 20mg  daily Carvedilol 3.125mg  BID  Previously tried: lisinopril dropped blood pressure in hospital  BP goal: <130/80  Family History: HTN in mother and sister.   Social History: Former smoker.   Diet: Eats mostly from home and bakes his chicken. Never adds salt - he does use Ms. DASH. Occasional coffee. No soda or tea.   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   Home BP readings: does not check at home.   Wt Readings from Last 3 Encounters:  08/28/16 219 lb 12.8 oz (99.7 kg)  08/17/16 208 lb 3.2 oz (94.4 kg)   BP Readings from Last 3 Encounters:  09/20/16 122/78  08/28/16 130/70  08/17/16 (!) 149/84   Pulse Readings from Last 3 Encounters:  09/20/16 (!) 106  08/28/16 97  08/17/16 (!) 106    Renal function: CrCl cannot be calculated (Patient's most recent lab result is older than the maximum 21 days allowed.).  Past Medical History:  Diagnosis Date  . Acute combined systolic and diastolic heart failure (HCC) 08/11/2016  . Acute esophagitis 08/17/2016  . Acute upper GI bleed 08/10/2016  . Asthma   . CAD (coronary artery  disease) 08/16/2016  . DCM (dilated cardiomyopathy) (HCC)   . HTN (hypertension)   . LBBB (left bundle branch block) 08/10/2016  . Lymphadenopathy 08/17/2016  . PUD (peptic ulcer disease) 08/17/2016  . Pulmonary HTN 08/17/2016    Current Outpatient Prescriptions on File Prior to Visit  Medication Sig Dispense Refill  . budesonide-formoterol (SYMBICORT) 80-4.5 MCG/ACT inhaler Inhale 1 puff into the lungs 2 (two) times daily. 1 Inhaler 12  . furosemide (LASIX) 20 MG tablet Take 1 tablet (20 mg total) by mouth daily. 30 tablet 3  . losartan (COZAAR) 50 MG tablet Take 1 tablet (50 mg total) by mouth daily. 90 tablet 1  . pantoprazole (PROTONIX) 40 MG tablet Take 1 tablet (40 mg total) by mouth 2 (two) times daily before a meal. 60 tablet 1  . spironolactone (ALDACTONE) 25 MG tablet Take 0.5 tablets (12.5 mg total) by mouth daily. 30 tablet 2   No current facility-administered medications on file prior to visit.     Allergies  Allergen Reactions  . Penicillins     Pt reports it makes him feel shaky Has patient had a PCN reaction causing immediate rash, facial/tongue/throat swelling, SOB or lightheadedness with hypotension: YES Has patient  had a PCN reaction causing severe rash involving mucus membranes or skin necrosis: NO Has patient had a PCN reaction that required hospitalization NO Has patient had a PCN reaction occurring within the last 10 years: NO If all of the above answers are "NO", then may proceed with Cephalosporin use.    Blood pressure 122/78, pulse (!) 106.   Assessment/Plan: Hypertension: BP is well controlled, but his HR is quite elevated. Will titrate carvedilol to 6.25mg  BID to combat heart rate. Advised he calls if he becomes dizzy. If carvedilol drops his pressure too much could consider changing to metoprolol XL since this tends to be less potent on BP. Per hospital discharge goal to transition to [facility] in the future. Follow up with Dr. Mayford Knife as scheduled and Pharmacy  clinic after for additional medication titration.    Thank you, Freddie Apley. Cleatis Polka, PharmD  [facility]  09/20/2016 12:53 PM

## 2016-09-20 NOTE — Patient Instructions (Addendum)
Return for a follow up appointment as scheduled with Dr. Mayford Knife - [phone]  Check your blood pressure at home daily (if able) and keep record of the readings.  Take your BP meds as follows: INCREASE carvedilol to (6.25mg ) 2 tablets TWICE daily (when you pick up the new tablet from the pharmacy take only 1 tablet twice a day)   Bring all of your meds, your BP cuff and your record of home blood pressures to your next appointment.  Exercise as you're able, try to walk approximately 30 minutes per day.  Keep salt intake to a minimum, especially watch canned and prepared boxed foods.  Eat more fresh fruits and vegetables and fewer canned items.  Avoid eating in fast food restaurants.    HOW TO TAKE YOUR BLOOD PRESSURE: . Rest 5 minutes before taking your blood pressure. .  Don't smoke or drink caffeinated beverages for at least 30 minutes before. . Take your blood pressure before (not after) you eat. . Sit comfortably with your back supported and both feet on the floor (don't cross your legs). . Elevate your arm to heart level on a table or a desk. . Use the proper sized cuff. It should fit smoothly and snugly around your bare upper arm. There should be enough room to slip a fingertip under the cuff. The bottom edge of the cuff should be 1 inch above the crease of the elbow. . Ideally, take 3 measurements at one sitting and record the average.

## 2016-10-15 NOTE — Progress Notes (Signed)
Cardiology Office Note    Date:  10/16/2016   ID:  Kristopher [name], DOB 19-Sep-1950, MRN [id number]  PCP:  Shirlean Mylar, MD  Cardiologist:  Armanda Magic, MD   Chief Complaint  Patient presents with  . Cardiomyopathy  . Congestive Heart Failure    History of Present Illness:  Kristopher [name] is a 66 y.o. male with a h/o HTN and asthma, who presented to [facility] on 08/10/16 with dyspnea and was found to have anemia and newly reduced EF.  2D echo showed reduced LVEF at 15-20%. Subsequent LHC showed minimal nonobstructive CAD (luminal irreg)with moderate pulmonary HTN and moderately elevated LVEDP at . Etiology of DCM unknown - ? HTN. He was placed on BB and ARB therapy as well as Aldactone. He was also found to have acute blood loss anemia 2/2 GIB. He was transfused x2 units. EGD showed 2 gastric ulcers as well as esophagitis. This was in the setting of chronic NSAID + ASA use. He was placed on a PPI and all NSAIDs were discontinued.  He presents to today for followup and is doing well.  He denies any chest pain or pressure, SOB, DOE, LE edema, dizziness, palpitations, PND, orthopnea or syncope. He is compliant with his meds    Past Medical History:  Diagnosis Date  . Acute combined systolic and diastolic heart failure (HCC) 08/11/2016  . Acute esophagitis 08/17/2016  . Acute upper GI bleed 08/10/2016  . Asthma   . CAD (coronary artery disease) 08/16/2016  . DCM (dilated cardiomyopathy) (HCC)   . HTN (hypertension)   . LBBB (left bundle branch block) 08/10/2016  . Lymphadenopathy 08/17/2016  . PUD (peptic ulcer disease) 08/17/2016  . Pulmonary HTN (HCC) 08/17/2016    Past Surgical History:  Procedure Laterality Date  . ESOPHAGOGASTRODUODENOSCOPY (EGD) WITH PROPOFOL Left 08/11/2016   Procedure: ESOPHAGOGASTRODUODENOSCOPY (EGD) WITH PROPOFOL;  Surgeon: Willis Modena, MD;  Location: [name] ENDOSCOPY;  Service: Endoscopy;  Laterality: Left;  . RIGHT/LEFT HEART CATH AND CORONARY ANGIOGRAPHY N/A 08/15/2016   Procedure: Right/Left Heart Cath and Coronary Angiography;  Surgeon: Corky Crafts, MD;  Location: [facility] INVASIVE CV LAB;  Service: Cardiovascular;  Laterality: N/A;    Current Medications: Current Meds  Medication Sig  . acetaminophen (TYLENOL) 325 MG tablet Take 650 mg by mouth every 6 (six) hours as needed.  . budesonide-formoterol (SYMBICORT) 80-4.5 MCG/ACT inhaler Inhale 1 puff into the lungs 2 (two) times daily.  . carvedilol (COREG) 6.25 MG tablet Take 1 tablet (6.25 mg total) by mouth 2 (two) times daily with a meal.  . furosemide (LASIX) 20 MG tablet Take 1 tablet (20 mg total) by mouth daily.  Marland Kitchen losartan (COZAAR) 50 MG tablet Take 1 tablet (50 mg total) by mouth daily.  . pantoprazole (PROTONIX) 40 MG tablet Take 1 tablet (40 mg total) by mouth 2 (two) times daily before a meal.  . spironolactone (ALDACTONE) 25 MG tablet Take 0.5 tablets (12.5 mg total) by mouth daily.    Allergies:   Penicillins   Social History   Social History  . Marital status: Single    Spouse name: N/A  . Number of children: N/A  . Years of education: N/A   Social History Main Topics  . Smoking status: Former Games developer  . Smokeless tobacco: Never Used  . Alcohol use No  . Drug use: No  . Sexual activity: Not Asked   Other Topics Concern  . None   Social History Narrative  . None  Family History:  The patient's family history includes Hypertension in his mother and sister.   ROS:   Please see the history of present illness.    ROS All other systems reviewed and are negative.  No flowsheet data found.     PHYSICAL EXAM:   VS:  BP 122/64   Pulse 94   Ht 5\' 7"  (1.702 m)   Wt 219 lb (99.3 kg)   SpO2 95%   BMI 34.30 kg/m    GEN: Well nourished, well developed, in no acute distress  HEENT: normal  Neck: no JVD, carotid bruits, or masses Cardiac: RRR; no murmurs, rubs, or gallops,no edema.  Intact distal pulses bilaterally.  Respiratory:  clear to auscultation bilaterally,  normal work of breathing GI: soft, nontender, nondistended, + BS MS: no deformity or atrophy  Skin: warm and dry, no rash Neuro:  Alert and Oriented x 3, Strength and sensation are intact Psych: euthymic mood, full affect  Wt Readings from Last 3 Encounters:  10/16/16 219 lb (99.3 kg)  08/28/16 219 lb 12.8 oz (99.7 kg)  08/17/16 208 lb 3.2 oz (94.4 kg)      Studies/Labs Reviewed:   EKG:  EKG is not ordered today.    Recent Labs: 08/10/2016: B Natriuretic Peptide 847.8 08/11/2016: ALT 9 08/13/2016: Magnesium 2.3 08/17/2016: Hemoglobin 10.6 08/28/2016: BUN 12; Creatinine, Ser 1.01; Platelets 400; Potassium 4.6; Sodium 144   Lipid Panel    Component Value Date/Time   CHOL 143 08/12/2016 0437   TRIG 73 08/12/2016 0437   HDL 39 (L) 08/12/2016 0437   CHOLHDL 3.7 08/12/2016 0437   VLDL 15 08/12/2016 0437   LDLCALC 89 08/12/2016 0437    Additional studies/ records that were reviewed today include:  none    ASSESSMENT:    1. Chronic combined systolic and diastolic heart failure (HCC)   2. Essential hypertension   3. DCM (dilated cardiomyopathy) (HCC)   4. Pulmonary HTN (HCC)      PLAN:  In order of problems listed above:  1. Chronic combined systolic/diastolic CHF - he appears euvolemic on exam today.  His weight is stable.  His discharge weight is 219lbs.  He will continue on Coreg, ARB, Lasix and aldactone. I will repeat an echo to see if EF has improved on HF meds. Check BMET.  If EF remains down, will change ARB to Entresto.   2. HTN - BP controlled on current meds.  He will continue on BB, ARB and aldactone.   3. DCM - EF 15% by echo.  Cath with no significant CAD (luminal irregularities).  4. Pulmonary HTN - moderate by cath.  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  Continue diuretics.  I will repeat an echo to see if this has improved with diuretics.    Medication Adjustments/Labs and Tests Ordered: Current medicines are reviewed at length with the  patient today.  Concerns regarding medicines are outlined above.  Medication changes, Labs and Tests ordered today are listed in the Patient Instructions below.  There are no Patient Instructions on file for this visit.   Signed, Armanda Magic, MD  10/16/2016 8:15 AM    [facility] [address] [place], [place], [place]  [address] Phone: [phone]; Fax: [phone]

## 2016-10-16 ENCOUNTER — Encounter: Payer: Self-pay | Admitting: Cardiology

## 2016-10-16 ENCOUNTER — Ambulatory Visit (INDEPENDENT_AMBULATORY_CARE_PROVIDER_SITE_OTHER): Payer: BC Managed Care – PPO | Admitting: Cardiology

## 2016-10-16 ENCOUNTER — Encounter (INDEPENDENT_AMBULATORY_CARE_PROVIDER_SITE_OTHER): Payer: Self-pay

## 2016-10-16 VITALS — BP 122/64 | HR 94 | Ht 67.0 in | Wt 219.0 lb

## 2016-10-16 DIAGNOSIS — I10 Essential (primary) hypertension: Secondary | ICD-10-CM

## 2016-10-16 DIAGNOSIS — I420 Dilated cardiomyopathy: Secondary | ICD-10-CM

## 2016-10-16 DIAGNOSIS — I5042 Chronic combined systolic (congestive) and diastolic (congestive) heart failure: Secondary | ICD-10-CM

## 2016-10-16 DIAGNOSIS — I272 Pulmonary hypertension, unspecified: Secondary | ICD-10-CM

## 2016-10-16 LAB — BASIC METABOLIC PANEL
BUN / CREAT RATIO: 11 (ref 10–24)
BUN: 13 mg/dL (ref 8–27)
CALCIUM: 9.5 mg/dL (ref 8.6–10.2)
CHLORIDE: 94 mmol/L — AB (ref 96–106)
CO2: 25 mmol/L (ref 18–29)
CREATININE: 1.22 mg/dL (ref 0.76–1.27)
GFR calc Af Amer: 71 mL/min/{1.73_m2} (ref >59)
GFR calc non Af Amer: 62 mL/min/{1.73_m2} (ref >59)
Glucose: 128 mg/dL — ABNORMAL HIGH (ref 65–99)
Potassium: 4.8 mmol/L (ref 3.5–5.2)
Sodium: 136 mmol/L (ref 134–144)

## 2016-10-16 NOTE — Patient Instructions (Signed)
Medication Instructions:  Your physician recommends that you continue on your current medications as directed. Please refer to the Current Medication list given to you today.   Labwork: TODAY: BMET  Testing/Procedures: Your physician has requested that you have an echocardiogram. Echocardiography is a painless test that uses sound waves to create images of your heart. It provides your doctor with information about the size and shape of your heart and how well your heart's chambers and valves are working. This procedure takes approximately one hour. There are no restrictions for this procedure.  Follow-Up: Your physician wants you to follow-up in: 6 months with Dr. Turner. You will receive a reminder letter in the mail two months in advance. If you don't receive a letter, please call our office to schedule the follow-up appointment.   Any Other Special Instructions Will Be Listed Below (If Applicable).     If you need a refill on your cardiac medications before your next appointment, please call your pharmacy.   

## 2016-10-24 ENCOUNTER — Ambulatory Visit (HOSPITAL_COMMUNITY): Payer: BC Managed Care – PPO | Attending: Cardiovascular Disease

## 2016-10-24 ENCOUNTER — Other Ambulatory Visit: Payer: Self-pay

## 2016-10-24 DIAGNOSIS — I420 Dilated cardiomyopathy: Secondary | ICD-10-CM | POA: Diagnosis not present

## 2016-10-24 DIAGNOSIS — I071 Rheumatic tricuspid insufficiency: Secondary | ICD-10-CM | POA: Insufficient documentation

## 2016-10-24 DIAGNOSIS — I5042 Chronic combined systolic (congestive) and diastolic (congestive) heart failure: Secondary | ICD-10-CM | POA: Insufficient documentation

## 2016-10-24 MED ORDER — PERFLUTREN LIPID MICROSPHERE
1.0000 mL | INTRAVENOUS | Status: AC | PRN
  Administered 2016-10-24: 2 mL via INTRAVENOUS

## 2016-11-06 ENCOUNTER — Telehealth: Payer: Self-pay

## 2016-11-06 DIAGNOSIS — I5042 Chronic combined systolic (congestive) and diastolic (congestive) heart failure: Secondary | ICD-10-CM

## 2016-11-06 DIAGNOSIS — R931 Abnormal findings on diagnostic imaging of heart and coronary circulation: Secondary | ICD-10-CM

## 2016-11-06 MED ORDER — SACUBITRIL-VALSARTAN 49-51 MG PO TABS: 1.0000 | ORAL_TABLET | Freq: Two times a day (BID) | ORAL | 0 refills | Status: DC

## 2016-11-06 NOTE — Telephone Encounter (Signed)
-----   Message from Quintella Reichert, MD sent at 10/25/2016 12:49 PM EDT ----- Compared to prior echo, his LVF has improved although still moderately reduced and RV is now normal in size and function.  Please stop losartan and start Entresto 49/51mg  BID and have him seen in HTN clinic in 2 weeks for uptitration of Entresto and repeat echo in 2 months.  If EF still < 35% will need referral to EP for AICD.

## 2016-11-06 NOTE — Telephone Encounter (Addendum)
Informed patient of results and verbal understanding expressed.  Instructed patient to STOP LOSARTAN today and START ENTRESTO 49/51 mg BID tomorrow. HTN Clinic OV scheduled 6/11. ECHO ordered to be scheduled in 2 months. Patient agrees with treatment plan.  Coreg refills sent in per patient request.

## 2016-11-07 MED ORDER — CARVEDILOL 6.25 MG PO TABS: 6.2500 mg | ORAL_TABLET | Freq: Two times a day (BID) | ORAL | 11 refills | Status: DC

## 2016-11-07 NOTE — Addendum Note (Signed)
Addended by: Gunnar Fusi A on: 11/07/2016 08:40 AM   Modules accepted: Orders

## 2016-11-12 NOTE — Addendum Note (Signed)
Addendum  created 11/12/16 1201 by Val Eagle, MD   Sign clinical note

## 2016-11-19 ENCOUNTER — Ambulatory Visit (INDEPENDENT_AMBULATORY_CARE_PROVIDER_SITE_OTHER): Payer: BC Managed Care – PPO | Admitting: Pharmacist

## 2016-11-19 VITALS — BP 110/70 | HR 97

## 2016-11-19 DIAGNOSIS — I5042 Chronic combined systolic (congestive) and diastolic (congestive) heart failure: Secondary | ICD-10-CM

## 2016-11-19 DIAGNOSIS — I10 Essential (primary) hypertension: Secondary | ICD-10-CM | POA: Diagnosis not present

## 2016-11-19 MED ORDER — PANTOPRAZOLE SODIUM 40 MG PO TBEC: 40.0000 mg | DELAYED_RELEASE_TABLET | Freq: Every day | ORAL | 3 refills | Status: DC

## 2016-11-19 MED ORDER — SACUBITRIL-VALSARTAN 49-51 MG PO TABS: 1.0000 | ORAL_TABLET | Freq: Two times a day (BID) | ORAL | 11 refills | Status: DC

## 2016-11-19 MED ORDER — CARVEDILOL 12.5 MG PO TABS: 12.5000 mg | ORAL_TABLET | Freq: Two times a day (BID) | ORAL | 11 refills | Status: DC

## 2016-11-19 NOTE — Progress Notes (Signed)
Patient ID: Kristopher [name]                 DOB: 09-Jan-1951                      MRN: [id number]     HPI: Kristopher [name] is a 66 y.o. male referred by Dr. Mayford [name] to HTN clinic for HF medication optimization. PMH is significant for HTN and asthma, who presented to [facility] on 08/10/16 with dyspnea and was found to have anemia and newly reduced EF, for which cardiology was consulted. EF was found to be 15-20%. He also was found to have GI bleed likely secondary to chronic NSAID use. He was started on BB, ARB and aldactone. Since then, carvedilol has been titrated and pt has been switched from losartan to [facility] when echo on 10/24/16 showed LVEF improved but still reduced at 30-35%.  Pt reports feeling better over the past few months like he has more energy. He brought in his medications today and multiple medication discrepancies were noted. He has still been taking carvedilol 3.125mg  BID - this dose was increased at an office visit 2 months ago. Rx was sent to [facility] but pt reports he is only using [facility] now. He also never stopped taking his losartan despite starting Entresto. Will check BMET today as K will likely be high (pt taking spironolactone in addition to Entresto and losartan).  Current HTN meds: carvedilol 3.125mg  BID, Entresto 49-51mg  BID, losartan 50mg  daily (never stopped taking this although 5/29 note states he understood instructions), spironolactone 12.5mg  daily, furoesmide 20mg  daily Previously tried:  BP goal: <130/72mmHg  Family History: HTN in mother and sister.   Social History: Former smoker.   Diet: Eats mostly from home and bakes his chicken. Never adds salt - he does use Mrs. DASH. Occasional coffee. No soda or tea.   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 Stays active with yard work too - this is easier now that he has more energy.   Home BP readings: does not check at home.   Wt Readings from Last 3 Encounters:  10/16/16 219 lb (99.3 kg)  08/28/16  219 lb 12.8 oz (99.7 kg)  08/17/16 208 lb 3.2 oz (94.4 kg)   BP Readings from Last 3 Encounters:  10/16/16 122/64  09/20/16 122/78  08/28/16 130/70   Pulse Readings from Last 3 Encounters:  10/16/16 94  09/20/16 (!) 106  08/28/16 97    Renal function: CrCl cannot be calculated (Patient's most recent lab result is older than the maximum 21 days allowed.).  Past Medical History:  Diagnosis Date  . Acute combined systolic and diastolic heart failure (HCC) 08/11/2016  . Acute esophagitis 08/17/2016  . Acute upper GI bleed 08/10/2016  . Asthma   . CAD (coronary artery disease) 08/16/2016  . DCM (dilated cardiomyopathy) (HCC)   . HTN (hypertension)   . LBBB (left bundle branch block) 08/10/2016  . Lymphadenopathy 08/17/2016  . PUD (peptic ulcer disease) 08/17/2016  . Pulmonary HTN (HCC) 08/17/2016    Current Outpatient Prescriptions on File Prior to Visit  Medication Sig Dispense Refill  . acetaminophen (TYLENOL) 325 MG tablet Take 650 mg by mouth every 6 (six) hours as needed.    . budesonide-formoterol (SYMBICORT) 80-4.5 MCG/ACT inhaler Inhale 1 puff into the lungs 2 (two) times daily. 1 Inhaler 12  . carvedilol (COREG) 6.25 MG tablet Take 1 tablet (6.25 mg total) by mouth 2 (two)  times daily with a meal. 60 tablet 11  . furosemide (LASIX) 20 MG tablet Take 1 tablet (20 mg total) by mouth daily. 30 tablet 3  . pantoprazole (PROTONIX) 40 MG tablet Take 1 tablet (40 mg total) by mouth 2 (two) times daily before a meal. 60 tablet 1  . sacubitril-valsartan (ENTRESTO) 49-51 MG Take 1 tablet by mouth 2 (two) times daily. 60 tablet 0  . spironolactone (ALDACTONE) 25 MG tablet Take 0.5 tablets (12.5 mg total) by mouth daily. 30 tablet 2   No current facility-administered medications on file prior to visit.     Allergies  Allergen Reactions  . Penicillins     Pt reports it makes him feel shaky Has patient had a PCN reaction causing immediate rash, facial/tongue/throat swelling, SOB or  lightheadedness with hypotension: YES Has patient had a PCN reaction causing severe rash involving mucus membranes or skin necrosis: NO Has patient had a PCN reaction that required hospitalization NO Has patient had a PCN reaction occurring within the last 10 years: NO If all of the above answers are "NO", then may proceed with Cephalosporin use.     Assessment/Plan:  1. Hypertension/HF medication optimization - BP at goal <130/77mmHg however HF medications not optimized likely secondary to medication illiteracy. Pt has been taking carvedilo 3.125mg  BID despite dose increase 2 months ago. He also continued taking losartan when Kristopher [name] was started despite documented phone call 5/29 where pt stated understanding to stop losartan. Advised pt to stop losartan today. HR in the 90s - will increase carvedilol to 6.25mg  BID for 1 week, then to 12.5mg  BID. BP should remain net neutral with d/c of losartan. Continue spironolactone. Discussed medications extensively with pt and reinforced instructions multiple times, including pt verbalizing plan back to me. Checking BMET today - anticipate K will be high since pt has been taking losartan, Entresto, and spironolactone.  F/u visit in 3 weeks for further medication titration. Per Dr Kristopher [name], plans for repeat echo in 2 months and EP referral if EF remains <35%.   Kristopher [name], PharmD, CPP, BCACP [facility] [address] [place], [place] [address] Phone: [phone]; Fax: [phone] 11/19/2016 3:06 PM

## 2016-11-19 NOTE — Patient Instructions (Addendum)
STOP taking losartan.  INCREASE your carvedilol. I sent in a new prescription for the 12.5mg  tablet. For the first week, take 1/2 tablet twice a day. Starting on the second week, increase your dose to 1 tablet twice a day.  I sent in a prescription for Entresto - bring in your copay card so that the medicine is affordable.   Continue taking your other medications.  Follow up in clinic for another blood pressure check in 3 weeks.

## 2016-11-20 ENCOUNTER — Telehealth: Payer: Self-pay

## 2016-11-20 LAB — BASIC METABOLIC PANEL
BUN / CREAT RATIO: 10 (ref 10–24)
BUN: 12 mg/dL (ref 8–27)
CHLORIDE: 97 mmol/L (ref 96–106)
CO2: 26 mmol/L (ref 20–29)
CREATININE: 1.17 mg/dL (ref 0.76–1.27)
Calcium: 9.4 mg/dL (ref 8.6–10.2)
GFR calc Af Amer: 75 mL/min/{1.73_m2} (ref >59)
GFR calc non Af Amer: 65 mL/min/{1.73_m2} (ref >59)
GLUCOSE: 89 mg/dL (ref 65–99)
POTASSIUM: 4.9 mmol/L (ref 3.5–5.2)
SODIUM: 138 mmol/L (ref 134–144)

## 2016-11-20 NOTE — Telephone Encounter (Signed)
PA for Entresto has been done through covermymeds. Awaiting response. 

## 2016-11-21 NOTE — Telephone Encounter (Signed)
Entresto PA has been approved by [employer] Approval good from 11/20/16 until 11/20/17. PA# [facility] Plan [id number] Non-Grandfathered [id number] Forde Radon

## 2016-12-07 ENCOUNTER — Telehealth: Payer: Self-pay | Admitting: Cardiology

## 2016-12-07 MED ORDER — FUROSEMIDE 20 MG PO TABS: 20.0000 mg | ORAL_TABLET | Freq: Every day | ORAL | 3 refills | Status: DC

## 2016-12-07 NOTE — Telephone Encounter (Signed)
NEW MESSAGE   *STAT* If patient is at the pharmacy, call can be transferred to refill team.   1. Which medications need to be refilled? (please list name of each medication and dose if known)  FUROSEMIDE 20MG   2. Which pharmacy/location (including street and city if local pharmacy) is medication to be sent to? [facility] [address]  3. Do they need a 30 day or 90 day supply?  30DAY  PT ALMOST OUT AND PILL BOTTLE SAYS NO REFILLS.

## 2016-12-07 NOTE — Telephone Encounter (Signed)
Called patient, states that he was placed on this medication in the hospital. Patient was seen by Dr. Mayford Knife in May and she states that she would like for the patient to continue taking this medication. 90 day supply sent to pharmacy.

## 2016-12-10 ENCOUNTER — Ambulatory Visit (INDEPENDENT_AMBULATORY_CARE_PROVIDER_SITE_OTHER): Payer: BC Managed Care – PPO | Admitting: Pharmacist

## 2016-12-10 ENCOUNTER — Encounter: Payer: Self-pay | Admitting: Pharmacist

## 2016-12-10 VITALS — BP 100/62 | HR 77

## 2016-12-10 DIAGNOSIS — I5042 Chronic combined systolic (congestive) and diastolic (congestive) heart failure: Secondary | ICD-10-CM | POA: Diagnosis not present

## 2016-12-10 DIAGNOSIS — I10 Essential (primary) hypertension: Secondary | ICD-10-CM

## 2016-12-10 MED ORDER — SPIRONOLACTONE 25 MG PO TABS: 12.5000 mg | ORAL_TABLET | Freq: Every day | ORAL | 2 refills | Status: DC

## 2016-12-10 NOTE — Progress Notes (Signed)
Patient ID: Kristopher [name]                 DOB: 1950/09/18                      MRN: [id number]     HPI: Kristopher [name] is a 66 y.o. male patient of Dr.Turner who presents today for hypertension follow up and medication management.  PMH is significant for HTN and asthma, who presented to [facility] on 08/10/16 with dyspnea and was found to have anemia and newly reduced EF, for which cardiology was consulted. EF was found to be 15-20%. He also was found to have GI bleed likely secondary to chronic NSAID use. He was started on BB, ARB and aldactone. Since then, carvedilol has been titrated and pt has been switched from losartan to [facility] when echo on 10/24/16 showed LVEF improved but still reduced at 30-35%. At his most recent visit in HTN clinic several medication discrepancies were uncovered - he was found to be taking Entresto and losartan and a different dose of carvedilol than prescribed. His medications were adjusted as below.   He presents today for follow up and medication titration. He brings his medications with him again today. He has 5 bottles of medication with him (carvedilol 12.5mg , furosemide 20mg , pantoprazole 40mg , and 2 bottles of Entresto 49-51mg ). He states he has been taking 2 of the bottles twice daily and all the others only once daily. He has been taking carvedilol and Entresto BID with an extra dose of Entresto 49-51mg  each morning. Upon asking about spironolactone he states he has not been taking this since his visit with Margaretmary Dys, PharmD. He states he just forgot that he needed the 1/2 tablet medication and thought the extra bottle of Entresto was the 5th medication he needed.   He reports he has been a little more lethargic than before. Denies dizziness, chest pain, shortness of breath.   Current HTN meds:  Spironolactone 12.5mg  daily - he has not been taking this since last visit here.  Entresto 49-51mg  BID Furosemide 20mg  daily Carvedilol 12.5mg  BID  Family History: HTN in  mother and sister.   Social History: Former smoker.   Diet:Eats mostly from home and bakes his chicken. Never adds salt - he does use Mrs. DASH. Occasional coffee. No soda or tea.   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 Stays active with yard work too - this is easier now that he has more energy.   Home BP readings:does not check at home.   Wt Readings from Last 3 Encounters:  10/16/16 219 lb (99.3 kg)  08/28/16 219 lb 12.8 oz (99.7 kg)  08/17/16 208 lb 3.2 oz (94.4 kg)   BP Readings from Last 3 Encounters:  12/10/16 100/62  11/19/16 110/70  10/16/16 122/64   Pulse Readings from Last 3 Encounters:  12/10/16 77  11/19/16 97  10/16/16 94    Renal function: CrCl cannot be calculated (Patient's most recent lab result is older than the maximum 21 days allowed.).  Past Medical History:  Diagnosis Date  . Acute combined systolic and diastolic heart failure (HCC) 08/11/2016  . Acute esophagitis 08/17/2016  . Acute upper GI bleed 08/10/2016  . Asthma   . CAD (coronary artery disease) 08/16/2016  . DCM (dilated cardiomyopathy) (HCC)   . HTN (hypertension)   . LBBB (left bundle branch block) 08/10/2016  . Lymphadenopathy 08/17/2016  . PUD (peptic ulcer disease) 08/17/2016  .  Pulmonary HTN (HCC) 08/17/2016    Current Outpatient Prescriptions on File Prior to Visit  Medication Sig Dispense Refill  . budesonide-formoterol (SYMBICORT) 80-4.5 MCG/ACT inhaler Inhale 1 puff into the lungs 2 (two) times daily. 1 Inhaler 12  . carvedilol (COREG) 12.5 MG tablet Take 1 tablet (12.5 mg total) by mouth 2 (two) times daily with a meal. 60 tablet 11  . furosemide (LASIX) 20 MG tablet Take 1 tablet (20 mg total) by mouth daily. 90 tablet 3  . pantoprazole (PROTONIX) 40 MG tablet Take 1 tablet (40 mg total) by mouth daily. 30 tablet 3  . sacubitril-valsartan (ENTRESTO) 49-51 MG Take 1 tablet by mouth 2 (two) times daily. 60 tablet 11  . acetaminophen (TYLENOL) 325 MG tablet Take 650  mg by mouth every 6 (six) hours as needed.     No current facility-administered medications on file prior to visit.     Allergies  Allergen Reactions  . Penicillins     Pt reports it makes him feel shaky Has patient had a PCN reaction causing immediate rash, facial/tongue/throat swelling, SOB or lightheadedness with hypotension: YES Has patient had a PCN reaction causing severe rash involving mucus membranes or skin necrosis: NO Has patient had a PCN reaction that required hospitalization NO Has patient had a PCN reaction occurring within the last 10 years: NO If all of the above answers are "NO", then may proceed with Cephalosporin use.    Blood pressure 100/62, pulse 77.   Assessment/Plan: Medication Management: BP today borderline low. With confusion about medications and lethargy will not increase doses at this time. Entresto tablets were combined into one bottle to help with compliance. He will continue Entresto 49/51mg  BID without the extra morning dose, but will restart spironolactone 12.5mg  once daily. A list was again given to him and teach back method used to evaluate understanding of how to take his medications. Will follow up in pharmacy clinic for assessment of adherence and after restart of spironolactone in 2 weeks.    Thank you, Freddie Apley. Cleatis Polka, PharmD  [facility]  12/10/2016 10:46 PM

## 2016-12-10 NOTE — Patient Instructions (Addendum)
START taking medications as follows:  ENTRESTO 49/51mg  (1 tablet) TWICE daily  CARVEDILOL 12.5mg  (1 tablet) TWICE daily   FUROSEMIDE 20mg  (1 tablet) ONCE daily   SPIRONOLACTONE 12.5mg  (1/2 tablet) ONCE daily - this is the one you need to pick up from the pharmacy  PANTOPRAZOLE 40mg  (1 tablet) ONCE daily

## 2016-12-28 ENCOUNTER — Other Ambulatory Visit: Payer: Self-pay

## 2016-12-28 ENCOUNTER — Ambulatory Visit: Payer: Self-pay

## 2016-12-28 NOTE — Progress Notes (Deleted)
Patient ID: Kristopher [name]                 DOB: 10/16/1950                      MRN: [id number]     HPI: Kristopher [name] is a 66 y.o. male patient of Dr.Turner who presents today for hypertension follow up and medication management.  PMH is significant for HTN and asthma, who presented to [facility] on 08/10/16 with dyspnea and was found to have anemia and newly reduced EF, for which cardiology was consulted. EF was found to be 15-20%. He also was found to have GI bleed likely secondary to chronic NSAID use. He was started on BB, ARB and aldactone. Since then, carvedilol has been titrated and pt has been switched from losartan to [facility] when echo on 10/24/16 showed LVEF improved but still reduced at 30-35%. At his previous visit with Margaretmary Dys, PharmD several medication discrepancies were uncovered - he was found to be taking Entresto and losartan and a different dose of carvedilol than prescribed. At his last visit with me, several more discrepancies were uncovered, including an extra dose of Entresto per day, as well as discontinuation of spironolactone. Spironolactone was restarted and Entresto dose was adjusted to 49/51mg  BID due to borderline low pressures.   He presents today for follow up and medication titration.      Current HTN meds:  Spironolactone 12.5mg  daily   Entresto 49-51mg  BID Furosemide 20mg  daily Carvedilol 12.5mg  BID  Family History: HTN in mother and sister.   Social History: Former smoker.   Diet:Eats mostly from home and bakes his chicken. Never adds salt - he does use Mrs. DASH. Occasional coffee. No soda or tea.   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 Stays active with yard work too - this is easier now that he has more energy.   Home BP readings:does not check at home.   Wt Readings from Last 3 Encounters:  10/16/16 219 lb (99.3 kg)  08/28/16 219 lb 12.8 oz (99.7 kg)  08/17/16 208 lb 3.2 oz (94.4 kg)   BP Readings from Last 3 Encounters:    12/10/16 100/62  11/19/16 110/70  10/16/16 122/64   Pulse Readings from Last 3 Encounters:  12/10/16 77  11/19/16 97  10/16/16 94    Renal function: CrCl cannot be calculated (Patient's most recent lab result is older than the maximum 21 days allowed.).  Past Medical History:  Diagnosis Date  . Acute combined systolic and diastolic heart failure (HCC) 08/11/2016  . Acute esophagitis 08/17/2016  . Acute upper GI bleed 08/10/2016  . Asthma   . CAD (coronary artery disease) 08/16/2016  . DCM (dilated cardiomyopathy) (HCC)   . HTN (hypertension)   . LBBB (left bundle branch block) 08/10/2016  . Lymphadenopathy 08/17/2016  . PUD (peptic ulcer disease) 08/17/2016  . Pulmonary HTN (HCC) 08/17/2016    Current Outpatient Prescriptions on File Prior to Visit  Medication Sig Dispense Refill  . acetaminophen (TYLENOL) 325 MG tablet Take 650 mg by mouth every 6 (six) hours as needed.    . budesonide-formoterol (SYMBICORT) 80-4.5 MCG/ACT inhaler Inhale 1 puff into the lungs 2 (two) times daily. 1 Inhaler 12  . carvedilol (COREG) 12.5 MG tablet Take 1 tablet (12.5 mg total) by mouth 2 (two) times daily with a meal. 60 tablet 11  . furosemide (LASIX) 20 MG tablet Take 1 tablet (20 mg  total) by mouth daily. 90 tablet 3  . pantoprazole (PROTONIX) 40 MG tablet Take 1 tablet (40 mg total) by mouth daily. 30 tablet 3  . sacubitril-valsartan (ENTRESTO) 49-51 MG Take 1 tablet by mouth 2 (two) times daily. 60 tablet 11  . spironolactone (ALDACTONE) 25 MG tablet Take 0.5 tablets (12.5 mg total) by mouth daily. 30 tablet 2   No current facility-administered medications on file prior to visit.     Allergies  Allergen Reactions  . Penicillins     Pt reports it makes him feel shaky Has patient had a PCN reaction causing immediate rash, facial/tongue/throat swelling, SOB or lightheadedness with hypotension: YES Has patient had a PCN reaction causing severe rash involving mucus membranes or skin necrosis: NO Has  patient had a PCN reaction that required hospitalization NO Has patient had a PCN reaction occurring within the last 10 years: NO If all of the above answers are "NO", then may proceed with Cephalosporin use.    There were no vitals taken for this visit.   Assessment/Plan: Medication Management:    Thank you, Freddie Apley. Cleatis Polka, PharmD  [facility]  12/28/2016 7:59 AM

## 2017-01-07 ENCOUNTER — Ambulatory Visit (HOSPITAL_COMMUNITY): Payer: BC Managed Care – PPO | Attending: Internal Medicine

## 2017-01-07 ENCOUNTER — Other Ambulatory Visit: Payer: Self-pay | Admitting: Cardiology

## 2017-01-07 DIAGNOSIS — I110 Hypertensive heart disease with heart failure: Secondary | ICD-10-CM | POA: Diagnosis not present

## 2017-01-07 DIAGNOSIS — I5042 Chronic combined systolic (congestive) and diastolic (congestive) heart failure: Secondary | ICD-10-CM | POA: Diagnosis not present

## 2017-01-07 DIAGNOSIS — R931 Abnormal findings on diagnostic imaging of heart and coronary circulation: Secondary | ICD-10-CM | POA: Diagnosis not present

## 2017-01-07 MED ORDER — PERFLUTREN LIPID MICROSPHERE
1.0000 mL | INTRAVENOUS | Status: AC | PRN
  Administered 2017-01-07: 2 mL via INTRAVENOUS

## 2017-02-27 ENCOUNTER — Other Ambulatory Visit: Payer: Self-pay | Admitting: Family Medicine

## 2017-02-27 DIAGNOSIS — R599 Enlarged lymph nodes, unspecified: Secondary | ICD-10-CM

## 2017-02-27 DIAGNOSIS — R918 Other nonspecific abnormal finding of lung field: Secondary | ICD-10-CM

## 2017-03-07 ENCOUNTER — Other Ambulatory Visit: Payer: Self-pay | Admitting: Cardiology

## 2017-11-08 ENCOUNTER — Telehealth: Payer: Self-pay

## 2017-11-08 NOTE — Telephone Encounter (Signed)
**Note De-Identified [name]** Letter received Kristopher [name] fax from [facility] stating that the pts Entresto PA has been approved. Approval good from 11/08/2017 until 11/09/2018.  I have notified the pts pharmacy.

## 2017-11-08 NOTE — Telephone Encounter (Signed)
I have done an [place] PA through covermymeds. Key: [id number]

## 2017-12-09 ENCOUNTER — Other Ambulatory Visit: Payer: Self-pay | Admitting: Cardiology

## 2018-01-14 ENCOUNTER — Other Ambulatory Visit: Payer: Self-pay | Admitting: Family Medicine

## 2018-01-14 ENCOUNTER — Ambulatory Visit
Admission: RE | Admit: 2018-01-14 | Discharge: 2018-01-14 | Disposition: A | Discharge status: Home / Self Care | Payer: Medicare Other | Source: Ambulatory Visit | Attending: Family Medicine | Admitting: Family Medicine

## 2018-01-14 DIAGNOSIS — R0602 Shortness of breath: Secondary | ICD-10-CM

## 2018-01-22 ENCOUNTER — Other Ambulatory Visit: Payer: Self-pay | Admitting: Family Medicine

## 2018-01-22 DIAGNOSIS — N5089 Other specified disorders of the male genital organs: Secondary | ICD-10-CM

## 2018-01-23 ENCOUNTER — Ambulatory Visit
Admission: RE | Admit: 2018-01-23 | Discharge: 2018-01-23 | Disposition: A | Discharge status: Home / Self Care | Payer: Medicare Other | Source: Ambulatory Visit | Attending: Family Medicine | Admitting: Family Medicine

## 2018-01-23 DIAGNOSIS — N5089 Other specified disorders of the male genital organs: Secondary | ICD-10-CM

## 2018-03-05 ENCOUNTER — Ambulatory Visit: Payer: Self-pay | Admitting: Cardiology

## 2018-03-13 ENCOUNTER — Other Ambulatory Visit: Payer: Self-pay | Admitting: Cardiology

## 2018-03-23 ENCOUNTER — Other Ambulatory Visit: Payer: Self-pay | Admitting: Cardiology

## 2018-03-25 ENCOUNTER — Other Ambulatory Visit: Payer: Self-pay | Admitting: Cardiology

## 2018-05-12 ENCOUNTER — Ambulatory Visit: Payer: Self-pay | Admitting: Cardiology

## 2018-07-17 ENCOUNTER — Other Ambulatory Visit: Payer: Self-pay | Admitting: Cardiology

## 2018-07-18 ENCOUNTER — Other Ambulatory Visit: Payer: Self-pay | Admitting: Cardiology

## 2018-07-21 ENCOUNTER — Inpatient Hospital Stay (HOSPITAL_COMMUNITY)
Admission: EM | Admit: 2018-07-21 | Discharge: 2018-08-08 | DRG: 286 | Disposition: A | Discharge status: Skilled Nursing Facility | Payer: Medicare Other | Attending: Family Medicine | Admitting: Family Medicine

## 2018-07-21 ENCOUNTER — Emergency Department (HOSPITAL_COMMUNITY): Payer: Medicare Other

## 2018-07-21 ENCOUNTER — Other Ambulatory Visit: Payer: Self-pay

## 2018-07-21 ENCOUNTER — Encounter (HOSPITAL_COMMUNITY): Payer: Self-pay | Admitting: Emergency Medicine

## 2018-07-21 DIAGNOSIS — K5900 Constipation, unspecified: Secondary | ICD-10-CM | POA: Diagnosis present

## 2018-07-21 DIAGNOSIS — E876 Hypokalemia: Secondary | ICD-10-CM | POA: Diagnosis not present

## 2018-07-21 DIAGNOSIS — E870 Hyperosmolality and hypernatremia: Secondary | ICD-10-CM | POA: Diagnosis not present

## 2018-07-21 DIAGNOSIS — R0602 Shortness of breath: Secondary | ICD-10-CM

## 2018-07-21 DIAGNOSIS — E1165 Type 2 diabetes mellitus with hyperglycemia: Secondary | ICD-10-CM | POA: Diagnosis not present

## 2018-07-21 DIAGNOSIS — I5042 Chronic combined systolic (congestive) and diastolic (congestive) heart failure: Secondary | ICD-10-CM | POA: Diagnosis present

## 2018-07-21 DIAGNOSIS — I952 Hypotension due to drugs: Secondary | ICD-10-CM | POA: Diagnosis not present

## 2018-07-21 DIAGNOSIS — G9341 Metabolic encephalopathy: Secondary | ICD-10-CM | POA: Diagnosis present

## 2018-07-21 DIAGNOSIS — I130 Hypertensive heart and chronic kidney disease with heart failure and stage 1 through stage 4 chronic kidney disease, or unspecified chronic kidney disease: Principal | ICD-10-CM | POA: Diagnosis present

## 2018-07-21 DIAGNOSIS — J189 Pneumonia, unspecified organism: Secondary | ICD-10-CM | POA: Diagnosis present

## 2018-07-21 DIAGNOSIS — I420 Dilated cardiomyopathy: Secondary | ICD-10-CM | POA: Diagnosis present

## 2018-07-21 DIAGNOSIS — A419 Sepsis, unspecified organism: Secondary | ICD-10-CM | POA: Diagnosis present

## 2018-07-21 DIAGNOSIS — Z452 Encounter for adjustment and management of vascular access device: Secondary | ICD-10-CM

## 2018-07-21 DIAGNOSIS — N179 Acute kidney failure, unspecified: Secondary | ICD-10-CM | POA: Diagnosis present

## 2018-07-21 DIAGNOSIS — R6521 Severe sepsis with septic shock: Secondary | ICD-10-CM | POA: Diagnosis present

## 2018-07-21 DIAGNOSIS — E662 Morbid (severe) obesity with alveolar hypoventilation: Secondary | ICD-10-CM | POA: Diagnosis present

## 2018-07-21 DIAGNOSIS — J9690 Respiratory failure, unspecified, unspecified whether with hypoxia or hypercapnia: Secondary | ICD-10-CM

## 2018-07-21 DIAGNOSIS — E1122 Type 2 diabetes mellitus with diabetic chronic kidney disease: Secondary | ICD-10-CM | POA: Diagnosis present

## 2018-07-21 DIAGNOSIS — J9601 Acute respiratory failure with hypoxia: Secondary | ICD-10-CM | POA: Diagnosis not present

## 2018-07-21 DIAGNOSIS — J9621 Acute and chronic respiratory failure with hypoxia: Secondary | ICD-10-CM | POA: Diagnosis present

## 2018-07-21 DIAGNOSIS — Z01818 Encounter for other preprocedural examination: Secondary | ICD-10-CM

## 2018-07-21 DIAGNOSIS — R0609 Other forms of dyspnea: Secondary | ICD-10-CM

## 2018-07-21 DIAGNOSIS — R140 Abdominal distension (gaseous): Secondary | ICD-10-CM

## 2018-07-21 DIAGNOSIS — N183 Chronic kidney disease, stage 3 (moderate): Secondary | ICD-10-CM | POA: Diagnosis present

## 2018-07-21 DIAGNOSIS — J441 Chronic obstructive pulmonary disease with (acute) exacerbation: Secondary | ICD-10-CM | POA: Diagnosis present

## 2018-07-21 DIAGNOSIS — J9622 Acute and chronic respiratory failure with hypercapnia: Secondary | ICD-10-CM | POA: Diagnosis present

## 2018-07-21 DIAGNOSIS — E874 Mixed disorder of acid-base balance: Secondary | ICD-10-CM | POA: Diagnosis present

## 2018-07-21 DIAGNOSIS — Z8249 Family history of ischemic heart disease and other diseases of the circulatory system: Secondary | ICD-10-CM

## 2018-07-21 DIAGNOSIS — I5043 Acute on chronic combined systolic (congestive) and diastolic (congestive) heart failure: Secondary | ICD-10-CM | POA: Diagnosis present

## 2018-07-21 DIAGNOSIS — I2510 Atherosclerotic heart disease of native coronary artery without angina pectoris: Secondary | ICD-10-CM | POA: Diagnosis not present

## 2018-07-21 DIAGNOSIS — G4733 Obstructive sleep apnea (adult) (pediatric): Secondary | ICD-10-CM | POA: Diagnosis present

## 2018-07-21 DIAGNOSIS — I447 Left bundle-branch block, unspecified: Secondary | ICD-10-CM | POA: Diagnosis present

## 2018-07-21 DIAGNOSIS — Z9111 Patient's noncompliance with dietary regimen: Secondary | ICD-10-CM

## 2018-07-21 DIAGNOSIS — Z6841 Body Mass Index (BMI) 40.0 and over, adult: Secondary | ICD-10-CM | POA: Diagnosis not present

## 2018-07-21 DIAGNOSIS — Z7951 Long term (current) use of inhaled steroids: Secondary | ICD-10-CM

## 2018-07-21 DIAGNOSIS — I10 Essential (primary) hypertension: Secondary | ICD-10-CM | POA: Diagnosis not present

## 2018-07-21 DIAGNOSIS — J440 Chronic obstructive pulmonary disease with acute lower respiratory infection: Secondary | ICD-10-CM | POA: Diagnosis present

## 2018-07-21 DIAGNOSIS — Z880 Allergy status to penicillin: Secondary | ICD-10-CM

## 2018-07-21 DIAGNOSIS — J9602 Acute respiratory failure with hypercapnia: Secondary | ICD-10-CM | POA: Diagnosis not present

## 2018-07-21 DIAGNOSIS — I272 Pulmonary hypertension, unspecified: Secondary | ICD-10-CM

## 2018-07-21 DIAGNOSIS — K279 Peptic ulcer, site unspecified, unspecified as acute or chronic, without hemorrhage or perforation: Secondary | ICD-10-CM | POA: Diagnosis present

## 2018-07-21 DIAGNOSIS — Z4659 Encounter for fitting and adjustment of other gastrointestinal appliance and device: Secondary | ICD-10-CM

## 2018-07-21 DIAGNOSIS — I5023 Acute on chronic systolic (congestive) heart failure: Secondary | ICD-10-CM | POA: Diagnosis not present

## 2018-07-21 DIAGNOSIS — Z79899 Other long term (current) drug therapy: Secondary | ICD-10-CM

## 2018-07-21 HISTORY — DX: Heart failure, unspecified: I50.9

## 2018-07-21 HISTORY — DX: Dyspnea, unspecified: R06.00

## 2018-07-21 LAB — COMPREHENSIVE METABOLIC PANEL
ALT: 11 U/L (ref 0–44)
AST: 17 U/L (ref 15–41)
Albumin: 3.5 g/dL (ref 3.5–5.0)
Alkaline Phosphatase: 55 U/L (ref 38–126)
Anion gap: 11 (ref 5–15)
BUN: 16 mg/dL (ref 8–23)
CHLORIDE: 94 mmol/L — AB (ref 98–111)
CO2: 35 mmol/L — ABNORMAL HIGH (ref 22–32)
CREATININE: 1.5 mg/dL — AB (ref 0.61–1.24)
Calcium: 8.6 mg/dL — ABNORMAL LOW (ref 8.9–10.3)
GFR calc Af Amer: 55 mL/min — ABNORMAL LOW (ref >60)
GFR calc non Af Amer: 47 mL/min — ABNORMAL LOW (ref >60)
Glucose, Bld: 113 mg/dL — ABNORMAL HIGH (ref 70–99)
Potassium: 4.3 mmol/L (ref 3.5–5.1)
Sodium: 140 mmol/L (ref 135–145)
Total Bilirubin: 1.3 mg/dL — ABNORMAL HIGH (ref 0.3–1.2)
Total Protein: 6.4 g/dL — ABNORMAL LOW (ref 6.5–8.1)

## 2018-07-21 LAB — CBC
HCT: 49.9 % (ref 39.0–52.0)
Hemoglobin: 13.7 g/dL (ref 13.0–17.0)
MCH: 24.9 pg — ABNORMAL LOW (ref 26.0–34.0)
MCHC: 27.5 g/dL — ABNORMAL LOW (ref 30.0–36.0)
MCV: 90.7 fL (ref 80.0–100.0)
Platelets: 380 10*3/uL (ref 150–400)
RBC: 5.5 MIL/uL (ref 4.22–5.81)
RDW: 19.3 % — ABNORMAL HIGH (ref 11.5–15.5)
WBC: 10.1 10*3/uL (ref 4.0–10.5)
nRBC: 0 % (ref 0.0–0.2)

## 2018-07-21 LAB — I-STAT TROPONIN, ED: Troponin i, poc: 0.03 ng/mL (ref 0.00–0.08)

## 2018-07-21 LAB — BRAIN NATRIURETIC PEPTIDE: B Natriuretic Peptide: 964.4 pg/mL — ABNORMAL HIGH (ref 0.0–100.0)

## 2018-07-21 MED ORDER — ALBUTEROL SULFATE (2.5 MG/3ML) 0.083% IN NEBU: 2.5000 mg | INHALATION_SOLUTION | RESPIRATORY_TRACT | Status: DC | PRN

## 2018-07-21 MED ORDER — SPIRONOLACTONE 12.5 MG HALF TABLET
12.5000 mg | ORAL_TABLET | Freq: Every day | ORAL | Status: DC
  Administered 2018-07-21 – 2018-07-24 (×4): 12.5 mg via ORAL
  Filled 2018-07-21 (×5): qty 1

## 2018-07-21 MED ORDER — HEPARIN SODIUM (PORCINE) 5000 UNIT/ML IJ SOLN
5000.0000 [IU] | Freq: Three times a day (TID) | INTRAMUSCULAR | Status: DC
  Administered 2018-07-21 – 2018-08-03 (×30): 5000 [IU] via SUBCUTANEOUS
  Filled 2018-07-21 (×34): qty 1

## 2018-07-21 MED ORDER — ONDANSETRON HCL 4 MG/2ML IJ SOLN
4.0000 mg | Freq: Four times a day (QID) | INTRAMUSCULAR | Status: DC | PRN
  Filled 2018-07-21: qty 2

## 2018-07-21 MED ORDER — SODIUM CHLORIDE 0.9% FLUSH
3.0000 mL | Freq: Two times a day (BID) | INTRAVENOUS | Status: DC
  Administered 2018-07-21 – 2018-08-08 (×26): 3 mL via INTRAVENOUS

## 2018-07-21 MED ORDER — IPRATROPIUM BROMIDE 0.02 % IN SOLN
0.5000 mg | RESPIRATORY_TRACT | Status: DC
  Administered 2018-07-21: 0.5 mg via RESPIRATORY_TRACT
  Filled 2018-07-21: qty 2.5

## 2018-07-21 MED ORDER — ACETAMINOPHEN 325 MG PO TABS
650.0000 mg | ORAL_TABLET | ORAL | Status: DC | PRN
  Administered 2018-07-30: 650 mg via ORAL
  Filled 2018-07-21: qty 2

## 2018-07-21 MED ORDER — FUROSEMIDE 10 MG/ML IJ SOLN
80.0000 mg | Freq: Two times a day (BID) | INTRAMUSCULAR | Status: DC
  Administered 2018-07-21 – 2018-07-22 (×2): 80 mg via INTRAVENOUS
  Filled 2018-07-21 (×2): qty 8

## 2018-07-21 MED ORDER — ASPIRIN EC 81 MG PO TBEC
81.0000 mg | DELAYED_RELEASE_TABLET | Freq: Every day | ORAL | Status: DC
  Administered 2018-07-21 – 2018-07-23 (×3): 81 mg via ORAL
  Filled 2018-07-21 (×4): qty 1

## 2018-07-21 MED ORDER — SODIUM CHLORIDE 0.9 % IV SOLN: 250.0000 mL | INTRAVENOUS | Status: DC | PRN

## 2018-07-21 MED ORDER — SODIUM CHLORIDE 0.9% FLUSH
3.0000 mL | INTRAVENOUS | Status: DC | PRN
  Administered 2018-08-01: 3 mL via INTRAVENOUS
  Filled 2018-07-21: qty 3

## 2018-07-21 MED ORDER — FUROSEMIDE 10 MG/ML IJ SOLN: 40.0000 mg | Freq: Two times a day (BID) | INTRAMUSCULAR | Status: DC

## 2018-07-21 MED ORDER — SACUBITRIL-VALSARTAN 49-51 MG PO TABS
1.0000 | ORAL_TABLET | Freq: Two times a day (BID) | ORAL | Status: DC
  Administered 2018-07-21 – 2018-07-25 (×8): 1 via ORAL
  Filled 2018-07-21 (×10): qty 1

## 2018-07-21 NOTE — H&P (Signed)
History and Physical    Kristopher [name] Emily ZOX:[id number] DOB: Jan 16, 1951 DOA: 07/21/2018  PCP: Shirlean MylarWebb, Carol, MD  Patient coming from: Dr. Addison Naegeliarol Webb's office  I have personally briefly reviewed patient's old medical records in [facility]  Chief Complaint: Shortness of breath and swollen legs  HPI: Kristopher [name] is a 68 y.o. male with medical history significant of neck systolic and diastolic congestive heart failure with an ejection fraction of 15%, coronary artery disease managed medically, pulmonary hypertension, asthma, hypertension, and type 2 diabetes who presents the emergency department at the request of his physician.  Been working hard to keep him out of the hospital he has had some confusion about his meds and has not been taking his carvedilol at all.  Gained 10 pounds and feels that he is extremely short of breath.  This has been building gradually over the last 3 weeks.  He has no fever no focal chest pain his legs and belly are swollen.  ED Course: BNP 964, requiring 4 L of O2, chest x-ray consistent with congestive heart failure.  Patient referred to us for further evaluation and management.  Review of Systems: As per HPI otherwise all other systems reviewed and  negative.    Past Medical History:  Diagnosis Date  . Acute combined systolic and diastolic heart failure (HCC) 08/11/2016  . Acute esophagitis 08/17/2016  . Acute upper GI bleed 08/10/2016  . Asthma   . CAD (coronary artery disease) 08/16/2016  . CHF (congestive heart failure) (HCC)   . DCM (dilated cardiomyopathy) (HCC)   . HTN (hypertension)   . LBBB (left bundle branch block) 08/10/2016  . Lymphadenopathy 08/17/2016  . PUD (peptic ulcer disease) 08/17/2016  . Pulmonary HTN (HCC) 08/17/2016    Past Surgical History:  Procedure Laterality Date  . ESOPHAGOGASTRODUODENOSCOPY (EGD) WITH PROPOFOL Left 08/11/2016   Procedure: ESOPHAGOGASTRODUODENOSCOPY (EGD) WITH PROPOFOL;  Surgeon: Willis ModenaWilliam Outlaw, MD;  [facility];  Service: Endoscopy;  Laterality: Left;  . RIGHT/LEFT HEART CATH AND CORONARY ANGIOGRAPHY N/A 08/15/2016   Procedure: Right/Left Heart Cath and Coronary Angiography;  Surgeon: Corky CraftsJayadeep S Varanasi, MD;  [facility];  Service: Cardiovascular;  Laterality: N/A;    Social History   Social History Narrative  . Not on file     reports that he has quit smoking. He has never used smokeless tobacco. He reports that he does not drink alcohol or use drugs.  Allergies  Allergen Reactions  . Penicillins     Pt reports it makes him feel shaky Has patient had a PCN reaction causing immediate rash, facial/tongue/throat swelling, SOB or lightheadedness with hypotension: YES Has patient had a PCN reaction causing severe rash involving mucus membranes or skin necrosis: NO Has patient had a PCN reaction that required hospitalization NO Has patient had a PCN reaction occurring within the last 10 years: NO If all of the above answers are "NO", then may proceed with Cephalosporin use.    Family History  Problem Relation Age of Onset  . Hypertension Mother   . Hypertension Sister      Prior to Admission medications   Medication Sig Start Date End Date Taking? Authorizing Provider  acetaminophen (TYLENOL) 325 MG tablet Take 650 mg by mouth every 6 (six) hours as needed.    [provider]  budesonide-formoterol (SYMBICORT) 80-4.5 MCG/ACT inhaler Inhale 1 puff into the lungs 2 (two) times daily. 08/17/16   Rama, Maryruth Bunhristina P, MD  carvedilol (COREG) 12.5 MG tablet  TAKE 1 TABLET(12.5 MG) BY MOUTH TWICE DAILY WITH A MEAL 03/13/18   Turner, Cornelious Bryant, MD  furosemide (LASIX) 20 MG tablet Take 1 tablet (20 mg total) by mouth daily. 12/07/16   Quintella Reichert, MD  pantoprazole (PROTONIX) 40 MG tablet Take 1 tablet (40 mg total) by mouth daily. Please make appt for future refills 1st attempt. 07/21/18   Quintella Reichert, MD  sacubitril-valsartan (ENTRESTO) 49-51 MG Take 1 tablet by mouth  2 (two) times daily. Please keep upcoming appt in December with Dr. Mayford Knife for future refills. Thank you 03/25/18   Quintella Reichert, MD  spironolactone (ALDACTONE) 25 MG tablet TAKE 1/2 TABLET(12.5 MG) BY MOUTH DAILY 03/13/18   Quintella Reichert, MD    Physical Exam:  Constitutional: NAD, calm, comfortable Vitals:   07/21/18 1300 07/21/18 1315 07/21/18 1330 07/21/18 1345  BP: 125/77 125/72 123/73 (!) 119/97  Pulse: 80 87 93 93  Resp: (!) 30 (!) 28 (!) 24 (!) 21  Temp:      TempSrc:      SpO2: 95% 96% 91% 92%  Weight:      Height:       Eyes: PERRL, lids and conjunctivae normal ENMT: Mucous membranes are moist. Posterior pharynx clear of any exudate or lesions.Normal dentition.  Neck: normal, supple, no masses, no thyromegaly Respiratory: clear to auscultation bilaterally, bilateral cardiac wheezing, bilateral crackles halfway up lung field.  Increased respiratory effort.  Mild accessory muscle use.  Cardiovascular: Regular rate and rhythm, no murmurs / rubs / gallops.  2+ extremity edema. 2+ pedal pulses. No carotid bruits.  Abdomen: no tenderness, no masses palpated. No hepatosplenomegaly. Bowel sounds positive.  Musculoskeletal: no clubbing / cyanosis. No joint deformity upper and lower extremities. Good ROM, no contractures. Normal muscle tone.  Skin: no rashes, lesions, ulcers. No induration Neurologic: CN 2-12 grossly intact. Sensation intact, DTR normal. Strength 5/5 in all 4.  Psychiatric: Normal judgment and insight. Alert and oriented x 3. Normal mood.   Labs on Admission: I have personally reviewed following labs and imaging studies  CBC: Recent Labs  Lab 07/21/18 1123  WBC 10.1  HGB 13.7  HCT 49.9  MCV 90.7  PLT 380   Basic Metabolic Panel: Recent Labs  Lab 07/21/18 1123  NA 140  K 4.3  CL 94*  CO2 35*  GLUCOSE 113*  BUN 16  CREATININE 1.50*  CALCIUM 8.6*   GFR: Estimated Creatinine Clearance: 58.3 mL/min (A) (by C-G formula based on SCr of 1.5 mg/dL  ([name])). Liver Function Tests: Recent Labs  Lab 07/21/18 1123  AST 17  ALT 11  ALKPHOS 55  BILITOT 1.3*  PROT 6.4*  ALBUMIN 3.5   Radiological Exams on Admission: Dg Chest 2 View  Result Date: 07/21/2018 CLINICAL DATA:  Shortness of breath and productive cough for 3-4 weeks. History of smoking. EXAM: CHEST - 2 VIEW COMPARISON:  01/14/2018 FINDINGS: Enlargement of the cardiac silhouette is unchanged. The patient has taken a shallower inspiration than on the prior study. There is peribronchial thickening. No airspace consolidation, overt edema, pleural effusion, or pneumothorax is identified. No acute osseous abnormality is seen. IMPRESSION: Bronchitic changes. Electronically Signed   By: Sebastian Ache M.D.   On: 07/21/2018 12:48    EKG: Independently reviewed.  Sinus rhythm with interventricular conduction delay with left axis deviation, left atrial enlargement, old infarction paired with 08/10/2016 T wave inversions in aVL are new  Assessment/Plan Active Problems:   DOE (dyspnea on exertion)  Chronic combined systolic and diastolic heart failure (HCC)   HTN (hypertension)   DCM (dilated cardiomyopathy) (HCC)   PUD (peptic ulcer disease)   Pulmonary HTN (HCC)   Acute on chronic combined systolic and diastolic congestive heart failure, NYHA class 4 (HCC)  1.  Dyspnea on exertion due to chronic combined systolic and diastolic heart failure: Patient will be admitted into the hospital we will start him on IV Lasix twice daily.  Will place him on a fluid restriction.  He reports liberal use of fluids at home.  Will manage him by checking weights daily.  Will consult cardiology.  Repeat echocardiogram is ordered.  2.  Hypertension: Continue home medication management.  3.  Dilated cardiomyopathy: Noted on prior evaluations.  Echocardiogram is ordered.  4.  Thick ulcer disease: Continue home proton pump inhibitor.  5.  Pulmonary hypertension: Continue home medication management.    DVT  prophylaxis: Subcu heparin Code Status: Full code Family Communication: Poke with patient's son who was present at the time of admission.  Patient retains capacity. Disposition Plan: Home in 3 to 4 days Consults called: Cardiology spoke with Daun Peacock Admission status: Patient   Lahoma Crocker MD FACP [facility] Pager [phone]  How to contact the [facility] Attending or Consulting provider 7A - 7P or covering provider during after hours 7P -7A, for this patient?  1. Check the care team in [facility] and look for a) attending/consulting [facility] provider listed and b) the [facility] team listed 2. Log into [email or web address] and use [place]'s universal password to access. If you do not have the password, please contact the hospital operator. 3. Locate the [facility] provider you are looking for under [facility] and page to a number that you can be directly reached. 4. If you still have difficulty reaching the provider, please page the [facility] (Director on Call) for the Hospitalists listed on amion for assistance.  If 7PM-7AM, please contact night-coverage [email or web address] Password [email or web address]  07/21/2018, 2:23 PM

## 2018-07-21 NOTE — ED Notes (Addendum)
Pt was not ambulated. Checked pt ox sat. w/o oxygen on first. Pt sats went from 98% to 90% within a minute of just sitting upright in bed. Pt began wheezing. Pt was placed back on [place] on 4L Pt sitting upright and comfortably now. RN Maggie notified.

## 2018-07-21 NOTE — ED Triage Notes (Signed)
Pt arrives via EMS from PCP with reports of SOB since Thursday. Pt reports a increase of 10 lbs since then. PCP reported O2 sats in 80s on RA, pt placed on 4L. Denies CP.

## 2018-07-21 NOTE — ED Provider Notes (Signed)
[facility] EMERGENCY DEPARTMENT Provider Note   CSN: [id number] Arrival date & time: 07/21/18  1053     History   Chief Complaint Chief Complaint  Patient presents with  . Shortness of Breath    HPI  Kristopher [name] is a 68 y.o. male with history of asthma combined systolic diastolic HF (EF 30-35%) presenting with lower extremity swelling and 10 lb weight gain over the past four days. He states his lasix and spironolactone have not been controlled his weight gain. He endorses increased orthopnea and has to sleep in a chair the last few nights and feels exhausted all day after walking around only a little. He denies SOB. He does not use supplemental O2 at home. He endorses productive cough with white sputum and occasional chills. He denies chest pain, fever, or nausea. He endorses some intermittent pain in his sides bilaterally. He states he has been urinating and denies dysuria. He denies recent changes in medication.   HPI  Past Medical History:  Diagnosis Date  . Acute combined systolic and diastolic heart failure (HCC) 08/11/2016  . Acute esophagitis 08/17/2016  . Acute upper GI bleed 08/10/2016  . Asthma   . CAD (coronary artery disease) 08/16/2016  . CHF (congestive heart failure) (HCC)   . DCM (dilated cardiomyopathy) (HCC)   . HTN (hypertension)   . LBBB (left bundle branch block) 08/10/2016  . Lymphadenopathy 08/17/2016  . PUD (peptic ulcer disease) 08/17/2016  . Pulmonary HTN (HCC) 08/17/2016    Patient Active Problem List   Diagnosis Date Noted  . PUD (peptic ulcer disease) 08/17/2016  . Acute esophagitis 08/17/2016  . Pulmonary HTN (HCC) 08/17/2016  . Lymphadenopathy 08/17/2016  . CAD (coronary artery disease) 08/16/2016  . Anemia   . DCM (dilated cardiomyopathy) (HCC)   . HTN (hypertension)   . Chronic combined systolic and diastolic heart failure (HCC) 08/11/2016  . Acute blood loss anemia 08/11/2016  . Acute upper GI bleed 08/10/2016  . DOE (dyspnea on  exertion) 08/10/2016  . LBBB (left bundle branch block) 08/10/2016    Past Surgical History:  Procedure Laterality Date  . ESOPHAGOGASTRODUODENOSCOPY (EGD) WITH PROPOFOL Left 08/11/2016   Procedure: ESOPHAGOGASTRODUODENOSCOPY (EGD) WITH PROPOFOL;  Surgeon: Willis Modena, MD;  [facility] ENDOSCOPY;  Service: Endoscopy;  Laterality: Left;  . RIGHT/LEFT HEART CATH AND CORONARY ANGIOGRAPHY N/A 08/15/2016   Procedure: Right/Left Heart Cath and Coronary Angiography;  Surgeon: Corky Crafts, MD;  Location: [facility] INVASIVE CV LAB;  Service: Cardiovascular;  Laterality: N/A;        Home Medications    Prior to Admission medications   Medication Sig Start Date End Date Taking? Authorizing Provider  acetaminophen (TYLENOL) 325 MG tablet Take 650 mg by mouth every 6 (six) hours as needed.    [provider]  budesonide-formoterol (SYMBICORT) 80-4.5 MCG/ACT inhaler Inhale 1 puff into the lungs 2 (two) times daily. 08/17/16   Rama, Maryruth Bun, MD  carvedilol (COREG) 12.5 MG tablet TAKE 1 TABLET(12.5 MG) BY MOUTH TWICE DAILY WITH A MEAL 03/13/18   Turner, Cornelious Bryant, MD  furosemide (LASIX) 20 MG tablet Take 1 tablet (20 mg total) by mouth daily. 12/07/16   Quintella Reichert, MD  pantoprazole (PROTONIX) 40 MG tablet TAKE 1 TABLET(40 MG) BY MOUTH DAILY 03/13/18   Turner, Cornelious Bryant, MD  sacubitril-valsartan (ENTRESTO) 49-51 MG Take 1 tablet by mouth 2 (two) times daily. Please keep upcoming appt in December with Dr. Mayford Knife for future refills. Thank you 03/25/18  Quintella Reichert, MD  spironolactone (ALDACTONE) 25 MG tablet TAKE 1/2 TABLET(12.5 MG) BY MOUTH DAILY 03/13/18   Quintella Reichert, MD    Family History Family History  Problem Relation Age of Onset  . Hypertension Mother   . Hypertension Sister     Social History Social History   Tobacco Use  . Smoking status: Former Games developer  . Smokeless tobacco: Never Used  Substance Use Topics  . Alcohol use: No  . Drug use: No     Allergies     Penicillins   Review of Systems Review of Systems  ROS negative except as noted in HPI.   Physical Exam Updated Vital Signs BP 139/72 (BP Location: Right Arm)   Pulse 94   Temp 97.9 F (36.6 C) (Oral)   Resp (!) 21   Ht 5' 6.5" (1.689 m)   Wt 117.9 kg   SpO2 97%   BMI 41.34 kg/m   Physical Exam Constitution: obese, supine in bed, pleasant HENT: [place]/AT, [place] on 4L  Eyes: no icterus or injection, eom intact  Cardio: distant heart sounds, no m/r/g  Respiratory: distant breath sounds, wheezing bilaterally, no rales or rhonchi  Abdominal: distended, soft, NTTP, +BS  MSK: +2 pitting edema bilaterally, moving all extremities  Neuro: a&o, cooperative  Skin: c/d/i   ED Treatments / Results  Labs (all labs ordered are listed, but only abnormal results are displayed) Labs Reviewed  COMPREHENSIVE METABOLIC PANEL  CBC  BRAIN NATRIURETIC PEPTIDE    EKG None  Radiology No results found.  Procedures Procedures (including critical care time)  Medications Ordered in ED Medications - No data to display   Initial Impression / Assessment and Plan / ED Course  I have reviewed the triage vital signs and the nursing notes.  Pertinent labs & imaging results that were available during my care of the patient were reviewed by me and considered in my medical decision making (see chart for details).  Clinical Course as of Jul 21 1150  Mon Jul 21, 2018  1147 Patient presenting with symptoms of likely heart failure exacerbation. CBC, CMP, trop, BNP, xr. He is 10lbs up from his base line and requiring 4L [place]. Does not use supplemental O2 at baseline and will likely need admission.    [JS]    Clinical Course User Index [JS] Elanah Osmanovic, Shanon Brow, DO    68yo male with PMH systolic and diastolic heart failure and asthma presenting with 10lb weight gain over the past four days, new onset orthopnea, and LE edema with new supplemental O2 requirements of 4L, increased BNP and AKI. He endorses  courg but cxr does not show consolidation and pneumonia low on differential. His symptoms are likely 2/2 HF exacerbation, and he will require diuresis and hospital admission with his supplemental O2 requirement. Discussed with TRH who will admit.   Final Clinical Impressions(s) / ED Diagnoses   Final diagnoses:  SOB (shortness of breath)    ED Discharge Orders    None       Harjas Biggins A, DO 07/21/18 1324    Tegeler, Canary Brim, MD 07/21/18 1800

## 2018-07-21 NOTE — Progress Notes (Signed)
Patient had oxygen out of nose. 02 saturations were at 77%. Breathing treatment given and placed back on 6L. Patient's 02 sats still 80%. Placed patient on [place] high flow [place] at 12L patient now 93%. RT will continue to monitor and wean as tolerated.

## 2018-07-21 NOTE — Plan of Care (Signed)
  Problem: Education: Goal: Knowledge of General Education information will improve Description: Including pain rating scale, medication(s)/side effects and non-pharmacologic comfort measures Outcome: Progressing   Problem: Nutrition: Goal: Adequate nutrition will be maintained Outcome: Progressing   Problem: Elimination: Goal: Will not experience complications related to bowel motility Outcome: Progressing   Problem: Safety: Goal: Ability to remain free from injury will improve Outcome: Progressing   

## 2018-07-21 NOTE — Consult Note (Signed)
Advanced Heart Failure Team Consult Note   Primary Physician: Shirlean MylarWebb, Carol, MD PCP-Cardiologist:  Armanda Magicraci Turner, MD  Reason for Consultation: Heart Failiure   HPI:    Humberto LeepJohn H Krouse is seen today for evaluation of heart failure  at the request of Dr Willette PaSheehan.   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   Cath 3/18 Minimal nonobstructive CAD RA 8 PA 56/18 (37) PCWP - not recorded LVEDP 23 Fick 6.3/3.1  Over the last 2 weeks he noticed increased shortness of breath and lower extremity edema. + orthopnea and PND. No CP. Weight at home has been trending up from 218 --> 250 pounds. He has been taking hsi medications. His family says he falls asleep during the day. He has been drinking lots of water and sprite. + cough and wheeze   CXR Pertinent admission labs: BNP 964, K 4.3, creatinine 1.5,  Hgb 13.7. CXR no edema   Echo7/18  Left ventricle: LVEF is approximately 45% with hypokinesis of the   septal wall and inferolateral walls . The cavity size was mildly   dilated. Wall thickness was increased in a pattern of mild LVH.  Review of Systems: [y] = yes, [ ]  = no   . General: Weight gain [ Y]; Weight loss [ ] ; Anorexia [ ] ; Fatigue [ ] ; Fever [ ] ; Chills [ ] ; Weakness [ ]   . Cardiac: Chest pain/pressure [ ] ; Resting SOB [ Y]; Exertional SOB [ Y]; Orthopnea [ ] ; Pedal Edema [ ] ; Palpitations [ ] ; Syncope [ ] ; Presyncope [ ] ; Paroxysmal nocturnal dyspnea[ ]   . Pulmonary: [place][y ]; [facility]y ]; Hemoptysis[ ] ; Sputum [ ] ; Snoring [ ]   . GI: Vomiting[ ] ; Dysphagia[ ] ; Melena[ ] ; Hematochezia [ ] ; Heartburn[ ] ; Abdominal pain [ ] ; Constipation [ ] ; Diarrhea [ ] ; BRBPR [ ]   . GU: Hematuria[ ] ; Dysuria [ ] ; Nocturia[ ]   . Vascular: Pain in legs with walking [ ] ; Pain in feet with lying flat [ ] ; Non-healing sores [ ] ; Stroke [ ] ; TIA [ ] ; Slurred speech [ ] ;  . Neuro: Headaches[ ] ;  Vertigo[ ] ; Seizures[ ] ; Paresthesias[ ] ;Blurred vision [ ] ; Diplopia [ ] ; Vision changes [ ]   . Ortho/Skin: Arthritis [ Y]; Joint pain [Y ]; Muscle pain [ ] ; Joint swelling [ ] ; Back Pain [Y ]; Rash [ ]   . Psych: Depression[ ] ; Anxiety[ ]   . Heme: Bleeding problems [ ] ; Clotting disorders [ ] ; Anemia [ ]   . Endocrine: Diabetes [Y ]; Thyroid dysfunction[ ]   Home Medications Prior to Admission medications   Medication Sig Start Date End Date Taking? Authorizing Provider  acetaminophen (TYLENOL) 325 MG tablet Take 650 mg by mouth every 6 (six) hours as needed.   Yes [provider]  budesonide-formoterol (SYMBICORT) 80-4.5 MCG/ACT inhaler Inhale 1 puff into the lungs 2 (two) times daily. 08/17/16  Yes Rama, Maryruth Bunhristina P, MD  carvedilol (COREG) 12.5 MG tablet TAKE 1 TABLET(12.5 MG) BY MOUTH TWICE DAILY WITH A MEAL 03/13/18  Yes Turner, Traci R, MD  furosemide (LASIX) 20 MG tablet Take 1 tablet (20 mg total) by mouth daily. 12/07/16  Yes Turner, Cornelious Bryantraci R, MD  pantoprazole (PROTONIX) 40 MG tablet Take 1 tablet (40 mg total) by mouth daily. Please make appt for future refills 1st attempt. 07/21/18  Yes Turner, Cornelious Bryantraci R, MD  albuterol (PROVENTIL) (2.5 MG/3ML) 0.083% nebulizer  solution Inhale 3 mLs into the lungs every 6 (six) hours as needed for wheezing or shortness of breath. 06/20/18   [provider]  furosemide (LASIX) 40 MG tablet Take 40 mg by mouth daily. 07/18/18   [provider]  sacubitril-valsartan (ENTRESTO) 49-51 MG Take 1 tablet by mouth 2 (two) times daily. Please keep upcoming appt in December with Dr. Mayford Knifeurner for future refills. Thank you 03/25/18   Quintella Reicherturner, Traci R, MD  spironolactone (ALDACTONE) 25 MG tablet TAKE 1/2 TABLET(12.5 MG) BY MOUTH DAILY Patient not taking: Reported on 07/21/2018 03/13/18   Quintella Reicherturner, Traci R, MD    Past Medical History: Past Medical History:  Diagnosis Date  . Acute combined systolic and diastolic heart failure (HCC) 08/11/2016  . Acute  esophagitis 08/17/2016  . Acute upper GI bleed 08/10/2016  . Asthma   . CAD (coronary artery disease) 08/16/2016  . CHF (congestive heart failure) (HCC)   . DCM (dilated cardiomyopathy) (HCC)   . HTN (hypertension)   . LBBB (left bundle branch block) 08/10/2016  . Lymphadenopathy 08/17/2016  . PUD (peptic ulcer disease) 08/17/2016  . Pulmonary HTN (HCC) 08/17/2016    Past Surgical History: Past Surgical History:  Procedure Laterality Date  . ESOPHAGOGASTRODUODENOSCOPY (EGD) WITH PROPOFOL Left 08/11/2016   Procedure: ESOPHAGOGASTRODUODENOSCOPY (EGD) WITH PROPOFOL;  Surgeon: Willis ModenaWilliam Outlaw, MD;  [facility];  Service: Endoscopy;  Laterality: Left;  . RIGHT/LEFT HEART CATH AND CORONARY ANGIOGRAPHY N/A 08/15/2016   Procedure: Right/Left Heart Cath and Coronary Angiography;  Surgeon: Corky CraftsJayadeep S Varanasi, MD;  [facility];  Service: Cardiovascular;  Laterality: N/A;    Family History: Family History  Problem Relation Age of Onset  . Hypertension Mother   . Hypertension Sister     Social History: Social History   Socioeconomic History  . Marital status: Single    Spouse name: Not on file  . Number of children: Not on file  . Years of education: Not on file  . Highest education level: Not on file  Occupational History  . Not on file  Social Needs  . Financial resource strain: Not on file  . Food insecurity:    Worry: Not on file    Inability: Not on file  . Transportation needs:    Medical: Not on file    Non-medical: Not on file  Tobacco Use  . Smoking status: Former Games developermoker  . Smokeless tobacco: Never Used  Substance and Sexual Activity  . Alcohol use: No  . Drug use: No  . Sexual activity: Not on file  Lifestyle  . Physical activity:    Days per week: Not on file    Minutes per session: Not on file  . Stress: Not on file  Relationships  . Social connections:    Talks on phone: Not on file    Gets together: Not on file    Attends religious service:  Not on file    Active member of club or organization: Not on file    Attends meetings of clubs or organizations: Not on file    Relationship status: Not on file  Other Topics Concern  . Not on file  Social History Narrative  . Not on file    Allergies:  Allergies  Allergen Reactions  . Penicillins     Pt reports it makes him feel shaky Has patient had a PCN reaction causing immediate rash, facial/tongue/throat swelling, SOB or lightheadedness with hypotension: YES Has patient had a PCN reaction causing severe  rash involving mucus membranes or skin necrosis: NO Has patient had a PCN reaction that required hospitalization NO Has patient had a PCN reaction occurring within the last 10 years: NO If all of the above answers are "NO", then may proceed with Cephalosporin use.    Objective:    Vital Signs:   Temp:  [97.9 F (36.6 C)-98.6 F (37 C)] 98.6 F (37 C) (02/10 1511) Pulse Rate:  [80-99] 94 (02/10 1430) Resp:  [18-38] 20 (02/10 1511) BP: (107-139)/(67-101) 136/81 (02/10 1511) SpO2:  [91 %-98 %] 96 % (02/10 1511) Weight:  [114.4 kg-117.9 kg] 114.4 kg (02/10 1511)    Weight change: Filed Weights   07/21/18 1102 07/21/18 1511  Weight: 117.9 kg 114.4 kg    Intake/Output:  No intake or output data in the 24 hours ending 07/21/18 1527    Physical Exam    General: Sitting on side of the bed.  No resp difficulty HEENT: normal Neck: supple. JVP to jaw . Carotids 2+ bilat; no bruits. No lymphadenopathy or thyromegaly appreciated. Cor: PMI nondisplaced. Regular rate & rhythm. No rubs, gallops or murmurs. Lungs: poor air movement EW  Abdomen: obese soft, nontender, distended. No hepatosplenomegaly. No bruits or masses. Good bowel sounds. Extremities: no cyanosis, clubbing, rash, 2+ edema Neuro: alert & orientedx3, cranial nerves grossly intact. moves all 4 extremities w/o difficulty. Affect pleasant   Telemetry   NSR 70-80s Personally reviewed  EKG    NSR 89  atypical LBBBB Personally reviewed   Labs   Basic Metabolic Panel: Recent Labs  Lab 07/21/18 1123  NA 140  K 4.3  CL 94*  CO2 35*  GLUCOSE 113*  BUN 16  CREATININE 1.50*  CALCIUM 8.6*    Liver Function Tests: Recent Labs  Lab 07/21/18 1123  AST 17  ALT 11  ALKPHOS 55  BILITOT 1.3*  PROT 6.4*  ALBUMIN 3.5   No results for input(s): LIPASE, AMYLASE in the last 168 hours. No results for input(s): AMMONIA in the last 168 hours.  CBC: Recent Labs  Lab 07/21/18 1123  WBC 10.1  HGB 13.7  HCT 49.9  MCV 90.7  PLT 380    Cardiac Enzymes: No results for input(s): CKTOTAL, CKMB, CKMBINDEX, TROPONINI in the last 168 hours.  BNP: BNP (last 3 results) Recent Labs    07/21/18 1123  BNP 964.4*    ProBNP (last 3 results) No results for input(s): PROBNP in the last 8760 hours.   CBG: No results for input(s): GLUCAP in the last 168 hours.  Coagulation Studies: No results for input(s): LABPROT, INR in the last 72 hours.   Imaging   Dg Chest 2 View  Result Date: 07/21/2018 CLINICAL DATA:  Shortness of breath and productive cough for 3-4 weeks. History of smoking. EXAM: CHEST - 2 VIEW COMPARISON:  01/14/2018 FINDINGS: Enlargement of the cardiac silhouette is unchanged. The patient has taken a shallower inspiration than on the prior study. There is peribronchial thickening. No airspace consolidation, overt edema, pleural effusion, or pneumothorax is identified. No acute osseous abnormality is seen. IMPRESSION: Bronchitic changes. Electronically Signed   By: Sebastian Ache M.D.   On: 07/21/2018 12:48      Medications:     Current Medications: . aspirin EC  81 mg Oral Daily  . furosemide  40 mg Intravenous Q12H  . heparin  5,000 Units Subcutaneous Q8H  . sodium chloride flush  3 mL Intravenous Q12H     Infusions: . sodium chloride  Patient Profile   Mr Grajales is a 68 year old with history of obesity, HTN, DM, asthma and chronic  diastolic/systolic heart failure due to NICM with EF 45% whom we are asked to see by Dr. Willette Pa for further management of ADHF   Assessment/Plan   1. Acute on chronic systolic/diastolic HF - Echo 01/07/17 EF 40%. Cath with minimal CAD - He is significantly volume overloaded in the setting of dietary non-compliance - Diurese with lasix 80 IV bid - Repeat echo - Continue Entresto and spiro - Hold b-blocker with active wheezing  2. Acute hypoxic respiratory failure - Likely combination of HF and AECOPD - Diurese - Would suggest nebs and steroids per primary team  3. Undiagnosed OSA - Will need outpatient sleep study  4. DM2 - Per primary team - Consider SGLT2i at d/c  5. AKI - Suspect will improve with diuresis  Length of Stay: 0  Arvilla Meres, MD  4:04 PM   Advanced Heart Failure Team Pager [phone] (M-F; 7a - 4p)  Please contact [facility] for night-coverage after hours (4p -7a ) and weekends on [email or web address]

## 2018-07-22 ENCOUNTER — Inpatient Hospital Stay (HOSPITAL_COMMUNITY): Payer: Medicare Other

## 2018-07-22 DIAGNOSIS — R0602 Shortness of breath: Secondary | ICD-10-CM

## 2018-07-22 LAB — BASIC METABOLIC PANEL
Anion gap: 11 (ref 5–15)
BUN: 14 mg/dL (ref 8–23)
CO2: 40 mmol/L — ABNORMAL HIGH (ref 22–32)
Calcium: 9.1 mg/dL (ref 8.9–10.3)
Chloride: 89 mmol/L — ABNORMAL LOW (ref 98–111)
Creatinine, Ser: 1.31 mg/dL — ABNORMAL HIGH (ref 0.61–1.24)
GFR calc Af Amer: >60 mL/min (ref >60)
GFR calc non Af Amer: 56 mL/min — ABNORMAL LOW (ref >60)
GLUCOSE: 142 mg/dL — AB (ref 70–99)
Potassium: 4.5 mmol/L (ref 3.5–5.1)
Sodium: 140 mmol/L (ref 135–145)

## 2018-07-22 LAB — HIV ANTIBODY (ROUTINE TESTING W REFLEX): HIV Screen 4th Generation wRfx: NONREACTIVE

## 2018-07-22 LAB — ECHOCARDIOGRAM COMPLETE
Height: 66 in
Weight: 4027.2 oz

## 2018-07-22 LAB — HEMOGLOBIN A1C
Hgb A1c MFr Bld: 6 % — ABNORMAL HIGH (ref 4.8–5.6)
Mean Plasma Glucose: 125.5 mg/dL

## 2018-07-22 LAB — MAGNESIUM: MAGNESIUM: 2.3 mg/dL (ref 1.7–2.4)

## 2018-07-22 MED ORDER — IPRATROPIUM BROMIDE 0.02 % IN SOLN: 0.5000 mg | Freq: Two times a day (BID) | RESPIRATORY_TRACT | Status: DC

## 2018-07-22 MED ORDER — FUROSEMIDE 10 MG/ML IJ SOLN
120.0000 mg | Freq: Two times a day (BID) | INTRAVENOUS | Status: DC
  Administered 2018-07-22 – 2018-07-24 (×5): 120 mg via INTRAVENOUS
  Filled 2018-07-22: qty 12
  Filled 2018-07-22: qty 10
  Filled 2018-07-22: qty 12
  Filled 2018-07-22 (×2): qty 2
  Filled 2018-07-22 (×2): qty 10

## 2018-07-22 MED ORDER — PREDNISONE 20 MG PO TABS
40.0000 mg | ORAL_TABLET | Freq: Every day | ORAL | Status: DC
  Administered 2018-07-22 – 2018-07-23 (×2): 40 mg via ORAL
  Filled 2018-07-22 (×2): qty 2

## 2018-07-22 MED ORDER — POTASSIUM CHLORIDE CRYS ER 20 MEQ PO TBCR
40.0000 meq | EXTENDED_RELEASE_TABLET | Freq: Once | ORAL | Status: AC
  Administered 2018-07-22: 40 meq via ORAL
  Filled 2018-07-22: qty 2

## 2018-07-22 MED ORDER — IPRATROPIUM BROMIDE 0.02 % IN SOLN
0.5000 mg | Freq: Four times a day (QID) | RESPIRATORY_TRACT | Status: DC
  Administered 2018-07-22: 0.5 mg via RESPIRATORY_TRACT
  Filled 2018-07-22: qty 2.5

## 2018-07-22 MED ORDER — ACETAZOLAMIDE ER 500 MG PO CP12
500.0000 mg | ORAL_CAPSULE | Freq: Two times a day (BID) | ORAL | Status: DC
  Administered 2018-07-22 – 2018-07-25 (×7): 500 mg via ORAL
  Filled 2018-07-22 (×9): qty 1

## 2018-07-22 MED ORDER — IPRATROPIUM-ALBUTEROL 0.5-2.5 (3) MG/3ML IN SOLN
3.0000 mL | Freq: Four times a day (QID) | RESPIRATORY_TRACT | Status: DC
  Administered 2018-07-22 – 2018-07-24 (×7): 3 mL via RESPIRATORY_TRACT
  Filled 2018-07-22 (×7): qty 3

## 2018-07-22 NOTE — Plan of Care (Signed)
  Problem: Activity: Goal: Risk for activity intolerance will decrease Outcome: Progressing   Problem: Elimination: Goal: Will not experience complications related to bowel motility Outcome: Not Progressing Output low

## 2018-07-22 NOTE — Progress Notes (Addendum)
Advanced Heart Failure Rounding Note  PCP-Cardiologist: Armanda Magic, MD   Subjective:    Diuresed with IV lasix. Sluggish diuresis noted.   Denies SOB/chest pain.    Objective:   Weight Range: 114.2 kg Body mass index is 40.63 kg/m.   Vital Signs:   Temp:  [97.9 F (36.6 C)-99.3 F (37.4 C)] 97.9 F (36.6 C) (02/11 0531) Pulse Rate:  [80-99] 89 (02/11 0531) Resp:  [18-38] 22 (02/11 0531) BP: (99-139)/(55-101) 99/55 (02/11 0531) SpO2:  [91 %-100 %] 98 % (02/11 0733) Weight:  [114.2 kg-117.9 kg] 114.2 kg (02/11 0156) Last BM Date: 07/17/18  Weight change: Filed Weights   07/21/18 1102 07/21/18 1511 07/22/18 0156  Weight: 117.9 kg 114.4 kg 114.2 kg    Intake/Output:   Intake/Output Summary (Last 24 hours) at 07/22/2018 0757 Last data filed at 07/22/2018 0500 Gross per 24 hour  Intake 240 ml  Output 725 ml  Net -485 ml      Physical Exam    General:  Well appearing. No resp difficulty. Sitting in the chair.  HEENT: Normal Neck: Supple. JVP to jaw   . Carotids 2+ bilat; no bruits. No lymphadenopathy or thyromegaly appreciated. Cor: PMI nondisplaced. Regular rate & rhythm. No rubs, gallops or murmurs. Lungs: Clear Abdomen: Soft, nontender, nondistended. No hepatosplenomegaly. No bruits or masses. Good bowel sounds. Extremities: No cyanosis, clubbing, rash, R and LLE 3+ edema Neuro: Alert & orientedx3, cranial nerves grossly intact. moves all 4 extremities w/o difficulty. Affect pleasant   Telemetry   SR/ST 90s-100s  1 episode of NSVT  EKG    n/a   Labs    CBC Recent Labs    07/21/18 1123  WBC 10.1  HGB 13.7  HCT 49.9  MCV 90.7  PLT 380   Basic Metabolic Panel Recent Labs    16/60/60 1123 07/22/18 0451  NA 140 140  K 4.3 4.5  CL 94* 89*  CO2 35* 40*  GLUCOSE 113* 142*  BUN 16 14  CREATININE 1.50* 1.31*  CALCIUM 8.6* 9.1   Liver Function Tests Recent Labs    07/21/18 1123  AST 17  ALT 11  ALKPHOS 55  BILITOT 1.3*  PROT  6.4*  ALBUMIN 3.5   No results for input(s): LIPASE, AMYLASE in the last 72 hours. Cardiac Enzymes No results for input(s): CKTOTAL, CKMB, CKMBINDEX, TROPONINI in the last 72 hours.  BNP: BNP (last 3 results) Recent Labs    07/21/18 1123  BNP 964.4*    ProBNP (last 3 results) No results for input(s): PROBNP in the last 8760 hours.   D-Dimer No results for input(s): DDIMER in the last 72 hours. Hemoglobin A1C No results for input(s): HGBA1C in the last 72 hours. Fasting Lipid Panel No results for input(s): CHOL, HDL, LDLCALC, TRIG, CHOLHDL, LDLDIRECT in the last 72 hours. Thyroid Function Tests No results for input(s): TSH, T4TOTAL, T3FREE, THYROIDAB in the last 72 hours.  Invalid input(s): FREET3  Other results:   Imaging    Dg Chest 2 View  Result Date: 07/21/2018 CLINICAL DATA:  Shortness of breath and productive cough for 3-4 weeks. History of smoking. EXAM: CHEST - 2 VIEW COMPARISON:  01/14/2018 FINDINGS: Enlargement of the cardiac silhouette is unchanged. The patient has taken a shallower inspiration than on the prior study. There is peribronchial thickening. No airspace consolidation, overt edema, pleural effusion, or pneumothorax is identified. No acute osseous abnormality is seen. IMPRESSION: Bronchitic changes. Electronically Signed   By: Jolaine Click.D.  On: 07/21/2018 12:48      Medications:     Scheduled Medications: . aspirin EC  81 mg Oral Daily  . furosemide  80 mg Intravenous Q12H  . heparin  5,000 Units Subcutaneous Q8H  . ipratropium-albuterol  3 mL Nebulization Q6H  . sacubitril-valsartan  1 tablet Oral BID  . sodium chloride flush  3 mL Intravenous Q12H  . spironolactone  12.5 mg Oral Daily     Infusions: . 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   Assessment/Plan  1. Acute on chronic systolic/diastolic HF - Echo 01/07/17 EF 22%. Cath with minimal CAD - He is significantly volume overloaded in the setting of dietary non-compliance - Continue Iasix 80 mg twice a day and add diamox 500 mg twice a day.  -  Repeat echo - Continue Entresto and spiro - Hold b-blocker with active wheezing - Add ted hose.  - Consult cardiac rehab.   2. Acute hypoxic respiratory failure - Likely combination of HF and AECOPD - Diurese. - Would suggest nebs and steroids per primary team  3. Undiagnosed OSA - Will need outpatient sleep study  4. DM2 - Per primary team - Consider SGLT2i at d/c  5. AKI - Suspect will improve with diuresis - Creatinine trending down 1.5>1.3    Length of Stay: 1  Amy Clegg, NP  07/22/2018, 7:57 AM  Advanced Heart Failure Team Pager [phone] (M-F; 7a - 4p)  Please contact [facility] for night-coverage after hours (4p -7a ) and weekends on [email or web address]  Patient seen and examined with the above-signed Advanced Practice Provider and/or Housestaff. I personally reviewed laboratory data, imaging studies and relevant notes. I independently examined the patient and formulated the important aspects of the plan. I have edited the note to reflect any of my changes or salient points. I have personally discussed the plan with the patient and/or family.  Sluggish diuresis despite lasix 80 IV. Will increase to 120 IV bid and add diamox given rising bicarb.Suspect he may have severe PAH. Will await echo. May need RHC. Will need outpatient sleep study. Respiratory status improving. I added nebs. Consider steroids. Will leave to discretion of primary team.   Arvilla Meres, MD  9:02 AM

## 2018-07-22 NOTE — Progress Notes (Signed)
  Echocardiogram 2D Echocardiogram has been performed. Patient refused Definity use.  Gerda Diss 07/22/2018, 11:01 AM

## 2018-07-22 NOTE — Plan of Care (Addendum)
Nutrition Education Note  RD consulted for nutrition education regarding new CHF.  Case discussed with RN prior to visit.   Spoke with pt and son at bedside. Pt very open regarding diet indiscretion PTA ("I over did it with pizza and fell off the wagon; I couldn't get back on it"). Pt shares he often consumes 2 meals per day, which consist of chicken or steak (baked or prepared in air fryer) and black beans. Pt reports he will sometimes eat outside of the home, but plans to cut back on this due to high sodium intake.   Spent the majority of visit discussing ways that pt could decrease sodium in diet, including limiting food prepared outside the home, incorporating fresh/frozen vegetables, or draining/rinsing canned vegetables prior to preparing in fresh water. Also discussed ways to season foods without salt, including Mrs. Dash and herbs/seasonings, such as [place] seasoning and garlic powder and healthier snack options. Suggested pt keep a food diary as he reports he often overeats and snacks when he is bored. Also reinforced importance of monitoring sodium intake and daily weights to prevent further hospitalizations. Pt was very receptive to this information.   Noted pt with hx of diabetes, however, pt denies this ("I've never had it, I don't take medication for it, and it doesn't even run in my family"). Glucose: 142. Per MD notes, plan to check Hgb A1c level. Offered to discuss carb modified diet with pt, however, he refused at this time.   RD provided "Low Sodium Nutrition Therapy" and "Sodium Free Seasoning Tips" handouts from the Academy of [employer] and Dietetics. Reviewed patient's dietary recall. Provided examples on ways to decrease sodium intake in diet. Discouraged intake of processed foods and use of salt shaker. Encouraged fresh fruits and vegetables as well as whole grain sources of carbohydrates to maximize fiber intake.   RD discussed why it is important for patient to adhere to diet  recommendations, and emphasized the role of fluids, foods to avoid, and importance of weighing self daily. Teach back method used.  Expect fair compliance.  Body mass index is 40.63 kg/m. Pt meets criteria for extreme obesity, class III based on current BMI.  Current diet order is Heart Healthy with 1.5 L fluid restriction, patient is consuming approximately 100% of meals at this time. Labs and medications reviewed. No further nutrition interventions warranted at this time. RD contact information provided. If additional nutrition issues arise, please re-consult RD.   Bonham Zingale A. Mayford Knife, RD, LDN, CDE Pager: [phone] After hours Pager: [phone]

## 2018-07-22 NOTE — Progress Notes (Signed)
PROGRESS NOTE    Kristopher [name]  VQX:[id number] DOB: 27-Jul-1950 DOA: 07/21/2018 PCP: Shirlean Mylar, MD  Brief Narrative: 68 year old male with chronic systolic and diastolic CHF, EF of 45%, history of borderline diabetes, tobacco abuse, asthma/COPD presented to the ED with progressive dyspnea over 2 weeks associated with lower extremity edema orthopnea PND and 30 pound weight gain. -Patient reports compliance with medications -In addition reports a recent URI cough and wheezing  Assessment & Plan:   Principal Problem:   Acute on chronic combined systolic and diastolic congestive heart failure, NYHA class 4 (HCC) -EF 45%, and ICM -30 pound weight gain -Left heart cath 3/201 8 with minimal nonobstructive CAD -Heart failure team following, appreciate input, IV Lasix dose increased -Follow-up repeat echo -Monitor I's/O weights -Diet education  Mild COPD exacerbation -May have a, mild component of bronchitis/COPD exacerbation, no wheezing at this time but very poor air movement will add prednisone for a few days and duo nebs  ?Borderline diabetes mellitus -Patient denies having diabetes, not on medications for this, will check hemoglobin A1c CBGs are elevated  Acute kidney injury -Likely cardiorenal, could improve with diuresis  PAH -Could be from underlying chronic lung disease, CHF and possible sleep apnea -Needs sleep study as outpatient  DVT prophylaxis: Heparin subcutaneous Code Status: Full code Family Communication: No family at bedside Disposition Plan: Home pending adequate diuresis  Consultants:   Heart failure team   Procedures:   Antimicrobials:    Subjective: -Breathing better, adamantly denies having diabetes mellitus  Objective: Vitals:   07/22/18 0531 07/22/18 0733 07/22/18 0859 07/22/18 0918  BP: (!) 99/55  94/65 101/65  Pulse: 89  94   Resp: (!) 22  (!) 30   Temp: 97.9 F (36.6 C)  99.4 F (37.4 C)   TempSrc: Oral  Oral   SpO2: 100% 98% 91%     Weight:      Height:        Intake/Output Summary (Last 24 hours) at 07/22/2018 1200 Last data filed at 07/22/2018 0900 Gross per 24 hour  Intake 560 ml  Output 1175 ml  Net -615 ml   Filed Weights   07/21/18 1102 07/21/18 1511 07/22/18 0156  Weight: 117.9 kg 114.4 kg 114.2 kg    Examination:  General exam: Appears calm and comfortable  Respiratory system: Poor air movement, no wheezing, fine basilar crackles Cardiovascular system: S1 & S2 heard, RRR.  Gastrointestinal system: Abdomen is nondistended, soft and nontender.Normal bowel sounds heard. Central nervous system: Alert and oriented. No focal neurological deficits. Extremities: 2+ edema  skin: No rashes, lesions or ulcers Psychiatry: Judgement and insight appear normal. Mood & affect appropriate.     Data Reviewed:   CBC: Recent Labs  Lab 07/21/18 1123  WBC 10.1  HGB 13.7  HCT 49.9  MCV 90.7  PLT 380   Basic Metabolic Panel: Recent Labs  Lab 07/21/18 1123 07/22/18 0451  NA 140 140  K 4.3 4.5  CL 94* 89*  CO2 35* 40*  GLUCOSE 113* 142*  BUN 16 14  CREATININE 1.50* 1.31*  CALCIUM 8.6* 9.1  MG  --  2.3   GFR: Estimated Creatinine Clearance: 65 mL/min (A) (by C-G formula based on SCr of 1.31 mg/dL (H)). Liver Function Tests: Recent Labs  Lab 07/21/18 1123  AST 17  ALT 11  ALKPHOS 55  BILITOT 1.3*  PROT 6.4*  ALBUMIN 3.5   No results for input(s): LIPASE, AMYLASE in the last 168 hours. No results for input(s):  AMMONIA in the last 168 hours. Coagulation Profile: No results for input(s): INR, PROTIME in the last 168 hours. Cardiac Enzymes: No results for input(s): CKTOTAL, CKMB, CKMBINDEX, TROPONINI in the last 168 hours. BNP (last 3 results) No results for input(s): PROBNP in the last 8760 hours. HbA1C: Recent Labs    07/22/18 0955  HGBA1C 6.0*   CBG: No results for input(s): GLUCAP in the last 168 hours. Lipid Profile: No results for input(s): CHOL, HDL, LDLCALC, TRIG, CHOLHDL,  LDLDIRECT in the last 72 hours. Thyroid Function Tests: No results for input(s): TSH, T4TOTAL, FREET4, T3FREE, THYROIDAB in the last 72 hours. Anemia Panel: No results for input(s): VITAMINB12, FOLATE, FERRITIN, TIBC, IRON, RETICCTPCT in the last 72 hours. Urine analysis: No results found for: COLORURINE, APPEARANCEUR, LABSPEC, PHURINE, GLUCOSEU, HGBUR, BILIRUBINUR, KETONESUR, PROTEINUR, UROBILINOGEN, NITRITE, LEUKOCYTESUR Sepsis Labs: @LABRCNTIP (procalcitonin:4,lacticidven:4)  )No results found for this or any previous visit (from the past 240 hour(s)).       Radiology Studies: Dg Chest 2 View  Result Date: 07/21/2018 CLINICAL DATA:  Shortness of breath and productive cough for 3-4 weeks. History of smoking. EXAM: CHEST - 2 VIEW COMPARISON:  01/14/2018 FINDINGS: Enlargement of the cardiac silhouette is unchanged. The patient has taken a shallower inspiration than on the prior study. There is peribronchial thickening. No airspace consolidation, overt edema, pleural effusion, or pneumothorax is identified. No acute osseous abnormality is seen. IMPRESSION: Bronchitic changes. Electronically Signed   By: Sebastian Ache M.D.   On: 07/21/2018 12:48        Scheduled Meds: . acetaZOLAMIDE  500 mg Oral Q12H  . aspirin EC  81 mg Oral Daily  . heparin  5,000 Units Subcutaneous Q8H  . ipratropium-albuterol  3 mL Nebulization Q6H  . potassium chloride  40 mEq Oral Once  . predniSONE  40 mg Oral Q breakfast  . sacubitril-valsartan  1 tablet Oral BID  . sodium chloride flush  3 mL Intravenous Q12H  . spironolactone  12.5 mg Oral Daily   Continuous Infusions: . sodium chloride    . furosemide       LOS: 1 day    Time spent:    Zannie Cove, MD [facility]  07/22/2018, 12:00 PM

## 2018-07-22 NOTE — Progress Notes (Signed)
CARDIAC REHAB PHASE I   PRE:  Rate/Rhythm: 91 SR  BP:  Supine:   Sitting: 106/61  Standing:    SaO2: 90% 4L  MODE:  Ambulation: 160 ft   POST:  Rate/Rhythm: 114 ST  BP:  Supine:   Sitting: 118/66  Standing:    SaO2: 78% 4L  6L to get sats up  93%   Then back to 4L after resting. [id number] Reviewed signs/symptoms of CHF with pt and discussed daily weights and 2000 mg sodium restriction. Pt stated that he has been eating a lot of pizza lately and knows he should not have done this. He is concerned as he is not passing much urine. Discussed signs of when to call MD. Pt walked 160 ft on 4L and thought he could go farther but he became tight in chest breathing before reaching room so glad we turned around early. Pt desat on 4L and it took 6L to get sats back up after walk but able to go back to 4L with rest. Visible DOE.   Luetta Nutting, RN BSN  07/22/2018 2:33 PM

## 2018-07-23 ENCOUNTER — Encounter (HOSPITAL_COMMUNITY): Payer: Self-pay | Admitting: General Practice

## 2018-07-23 LAB — BASIC METABOLIC PANEL
Anion gap: 13 (ref 5–15)
BUN: 22 mg/dL (ref 8–23)
CHLORIDE: 89 mmol/L — AB (ref 98–111)
CO2: 34 mmol/L — ABNORMAL HIGH (ref 22–32)
CREATININE: 1.43 mg/dL — AB (ref 0.61–1.24)
Calcium: 9.4 mg/dL (ref 8.9–10.3)
GFR calc Af Amer: 58 mL/min — ABNORMAL LOW (ref >60)
GFR calc non Af Amer: 50 mL/min — ABNORMAL LOW (ref >60)
Glucose, Bld: 102 mg/dL — ABNORMAL HIGH (ref 70–99)
Potassium: 4.3 mmol/L (ref 3.5–5.1)
Sodium: 136 mmol/L (ref 135–145)

## 2018-07-23 MED ORDER — PREDNISONE 20 MG PO TABS: 20.0000 mg | ORAL_TABLET | Freq: Every day | ORAL | Status: DC

## 2018-07-23 MED ORDER — METOLAZONE 5 MG PO TABS
5.0000 mg | ORAL_TABLET | Freq: Two times a day (BID) | ORAL | Status: DC
  Administered 2018-07-23 – 2018-07-24 (×4): 5 mg via ORAL
  Filled 2018-07-23 (×4): qty 1

## 2018-07-23 MED ORDER — ASPIRIN 81 MG PO CHEW
81.0000 mg | CHEWABLE_TABLET | ORAL | Status: AC
  Administered 2018-07-24: 81 mg via ORAL
  Filled 2018-07-23: qty 1

## 2018-07-23 MED ORDER — SODIUM CHLORIDE 0.9 % IV SOLN
INTRAVENOUS | Status: DC
  Administered 2018-07-24: 06:00:00 via INTRAVENOUS

## 2018-07-23 MED ORDER — POTASSIUM CHLORIDE CRYS ER 20 MEQ PO TBCR
40.0000 meq | EXTENDED_RELEASE_TABLET | Freq: Once | ORAL | Status: AC
  Administered 2018-07-23: 40 meq via ORAL
  Filled 2018-07-23: qty 2

## 2018-07-23 NOTE — Progress Notes (Signed)
PROGRESS NOTE    Kristopher [name]  IHK:[id number] DOB: April 16, 1951 DOA: 07/21/2018 PCP: Shirlean Mylar, MD  Brief Narrative: 68 year old male with chronic systolic and diastolic CHF, EF of 45%, history of borderline diabetes, tobacco abuse, asthma/COPD presented to the ED with progressive dyspnea over 2 weeks associated with lower extremity edema orthopnea PND and 30 pound weight gain. -Patient reports compliance with medications -In addition reports a recent URI cough and wheezing  Assessment & Plan:   Principal Problem:   Acute on chronic combined systolic and diastolic congestive heart failure, NYHA class 4 (HCC) -EF 45%, and ICM -30 pound weight gain -Left heart cath 3/201 8 with minimal nonobstructive CAD -Heart failure team following, appreciate input, IV Lasix -Follow-up repeat echo -Monitor I's/O weights -Diet education  Mild COPD exacerbation -May have a, mild component of bronchitis/COPD exacerbation, no wheezing at this time but very poor air movement will reduce prednisone for a few days and duo nebs  ?Borderline diabetes mellitus -Patient denies having diabetes, not on medications for this, will check hemoglobin A1c CBGs are elevated  Acute kidney injury -Likely cardiorenal, could improve with diuresis  PAH -Could be from underlying chronic lung disease, CHF and possible sleep apnea -Needs sleep study as outpatient  DVT prophylaxis: Heparin subcutaneous Code Status: Full code Family Communication: No family at bedside Disposition Plan: Home pending adequate diuresis  Consultants:   Heart failure team   Procedures:   Antimicrobials:    Subjective: Continues to have shortness of breath.  No nausea no vomiting no fever no chills.  Objective: Vitals:   07/23/18 1034 07/23/18 1140 07/23/18 1445 07/23/18 1922  BP: 119/65 132/70  (!) 101/47  Pulse:  93  96  Resp:  (!) 21  18  Temp:  98.4 F (36.9 C)  99.5 F (37.5 C)  TempSrc:  Oral  Oral  SpO2: 91% 94%  (!) 79% 94%  Weight:      Height:        Intake/Output Summary (Last 24 hours) at 07/23/2018 1926 Last data filed at 07/23/2018 1722 Gross per 24 hour  Intake 843 ml  Output 2050 ml  Net -1207 ml   Filed Weights   07/21/18 1511 07/22/18 0156 07/23/18 0527  Weight: 114.4 kg 114.2 kg 114.1 kg    Examination:  General exam: Appears calm and comfortable  Respiratory system: Poor air movement, no wheezing, fine basilar crackles Cardiovascular system: S1 & S2 heard, RRR.  Gastrointestinal system: Abdomen is nondistended, soft and nontender.Normal bowel sounds heard. Central nervous system: Alert and oriented. No focal neurological deficits. Extremities: 2+ edema  skin: No rashes, lesions or ulcers Psychiatry: Judgement and insight appear normal. Mood & affect appropriate.     Data Reviewed:   CBC: Recent Labs  Lab 07/21/18 1123  WBC 10.1  HGB 13.7  HCT 49.9  MCV 90.7  PLT 380   Basic Metabolic Panel: Recent Labs  Lab 07/21/18 1123 07/22/18 0451 07/23/18 0421  NA 140 140 136  K 4.3 4.5 4.3  CL 94* 89* 89*  CO2 35* 40* 34*  GLUCOSE 113* 142* 102*  BUN 16 14 22   CREATININE 1.50* 1.31* 1.43*  CALCIUM 8.6* 9.1 9.4  MG  --  2.3  --    GFR: Estimated Creatinine Clearance: 59.5 mL/min (A) (by C-G formula based on SCr of 1.43 mg/dL (H)). Liver Function Tests: Recent Labs  Lab 07/21/18 1123  AST 17  ALT 11  ALKPHOS 55  BILITOT 1.3*  PROT 6.4*  ALBUMIN  3.5   No results for input(s): LIPASE, AMYLASE in the last 168 hours. No results for input(s): AMMONIA in the last 168 hours. Coagulation Profile: No results for input(s): INR, PROTIME in the last 168 hours. Cardiac Enzymes: No results for input(s): CKTOTAL, CKMB, CKMBINDEX, TROPONINI in the last 168 hours. BNP (last 3 results) No results for input(s): PROBNP in the last 8760 hours. HbA1C: Recent Labs    07/22/18 0955  HGBA1C 6.0*   CBG: No results for input(s): GLUCAP in the last 168 hours. Lipid  Profile: No results for input(s): CHOL, HDL, LDLCALC, TRIG, CHOLHDL, LDLDIRECT in the last 72 hours. Thyroid Function Tests: No results for input(s): TSH, T4TOTAL, FREET4, T3FREE, THYROIDAB in the last 72 hours. Anemia Panel: No results for input(s): VITAMINB12, FOLATE, FERRITIN, TIBC, IRON, RETICCTPCT in the last 72 hours. Urine analysis: No results found for: COLORURINE, APPEARANCEUR, LABSPEC, PHURINE, GLUCOSEU, HGBUR, BILIRUBINUR, KETONESUR, PROTEINUR, UROBILINOGEN, NITRITE, LEUKOCYTESUR Sepsis Labs: @LABRCNTIP (procalcitonin:4,lacticidven:4)  )No results found for this or any previous visit (from the past 240 hour(s)).       Radiology Studies: No results found.      Scheduled Meds: . acetaZOLAMIDE  500 mg Oral Q12H  . aspirin EC  81 mg Oral Daily  . heparin  5,000 Units Subcutaneous Q8H  . ipratropium-albuterol  3 mL Nebulization Q6H  . metolazone  5 mg Oral BID  . [START ON 07/24/2018] predniSONE  20 mg Oral Q breakfast  . sacubitril-valsartan  1 tablet Oral BID  . sodium chloride flush  3 mL Intravenous Q12H  . spironolactone  12.5 mg Oral Daily   Continuous Infusions: . sodium chloride    . furosemide 120 mg (07/23/18 1628)     LOS: 2 days    Time spent:    Author:  Lynden Oxford, MD [facility] 07/23/2018   To reach On-call, see care teams to locate the attending and reach out to them via [email or web address]. If 7PM-7AM, please contact night-coverage If you still have difficulty reaching the attending provider, please page the [facility] (Director on Call) for [facility] on amion for assistance.  07/23/2018, 7:26 PM

## 2018-07-23 NOTE — Progress Notes (Addendum)
Advanced Heart Failure Rounding Note  PCP-Cardiologist: Armanda Magic, MD   Subjective:    IV lasix increased to 120 mg BID. Added diamox yesterday. Improved diuresis, but only net negative 1.2 L. Weight unchanged. Creatinine trending up 1.31 -> 1.43. Bicarb improving 40>34.  Desats to 78% on 4L when up with cardiac rehab yesterday. Started on prednisone yesterday.   Feels a little better today. Says his urine output is picking up this morning. Had chest tightness and SOB when walking with cardiac rehab yesterday. +cough with clear sputum.  Echo 07/22/18: EF 25-30%, global HK, RV normal function, increased wall thickness, LA moderately dilated, RA severely dilated  Objective:   Weight Range: 114.1 kg Body mass index is 40.59 kg/m.   Vital Signs:   Temp:  [98.5 F (36.9 C)-99.4 F (37.4 C)] 98.5 F (36.9 C) (02/12 0527) Pulse Rate:  [86-111] 111 (02/12 0527) Resp:  [20-30] 20 (02/12 0527) BP: (94-110)/(57-69) 110/57 (02/12 0527) SpO2:  [91 %-98 %] 91 % (02/12 0527) Weight:  [114.1 kg] 114.1 kg (02/12 0527) Last BM Date: 07/21/18  Weight change: Filed Weights   07/21/18 1511 07/22/18 0156 07/23/18 0527  Weight: 114.4 kg 114.2 kg 114.1 kg    Intake/Output:   Intake/Output Summary (Last 24 hours) at 07/23/2018 0726 Last data filed at 07/23/2018 0513 Gross per 24 hour  Intake 622 ml  Output 1865 ml  Net -1243 ml      Physical Exam    General: Sitting in chair. No resp difficulty. HEENT: Normal Neck: Supple. JVP to jaw. Carotids 2+ bilat; no bruits. No thyromegaly or nodule noted. Cor: PMI nondisplaced. RRR, No M/G/R noted Lungs: Decreased throughout. Occ wheeze.  Abdomen: Soft, non-tender, non-distended, no HSM. No bruits or masses. +BS  Extremities: No cyanosis, clubbing, or rash. R and LLE 2+ edema L>R. BLE TED hose on.  Neuro: Alert & orientedx3, cranial nerves grossly intact. moves all 4 extremities w/o difficulty. Affect pleasant   Telemetry    NSR/Sinus tach 90-100s. Personally reviewed.   EKG    No new tracings.   Labs    CBC Recent Labs    07/21/18 1123  WBC 10.1  HGB 13.7  HCT 49.9  MCV 90.7  PLT 380   Basic Metabolic Panel Recent Labs    76/19/50 0451 07/23/18 0421  NA 140 136  K 4.5 4.3  CL 89* 89*  CO2 40* 34*  GLUCOSE 142* 102*  BUN 14 22  CREATININE 1.31* 1.43*  CALCIUM 9.1 9.4  MG 2.3  --    Liver Function Tests Recent Labs    07/21/18 1123  AST 17  ALT 11  ALKPHOS 55  BILITOT 1.3*  PROT 6.4*  ALBUMIN 3.5   No results for input(s): LIPASE, AMYLASE in the last 72 hours. Cardiac Enzymes No results for input(s): CKTOTAL, CKMB, CKMBINDEX, TROPONINI in the last 72 hours.  BNP: BNP (last 3 results) Recent Labs    07/21/18 1123  BNP 964.4*    ProBNP (last 3 results) No results for input(s): PROBNP in the last 8760 hours.   D-Dimer No results for input(s): DDIMER in the last 72 hours. Hemoglobin A1C Recent Labs    07/22/18 0955  HGBA1C 6.0*   Fasting Lipid Panel No results for input(s): CHOL, HDL, LDLCALC, TRIG, CHOLHDL, LDLDIRECT in the last 72 hours. Thyroid Function Tests No results for input(s): TSH, T4TOTAL, T3FREE, THYROIDAB in the last 72 hours.  Invalid input(s): FREET3  Other results:   Imaging  No results found.   Medications:     Scheduled Medications: . acetaZOLAMIDE  500 mg Oral Q12H  . aspirin EC  81 mg Oral Daily  . heparin  5,000 Units Subcutaneous Q8H  . ipratropium-albuterol  3 mL Nebulization Q6H  . predniSONE  40 mg Oral Q breakfast  . sacubitril-valsartan  1 tablet Oral BID  . sodium chloride flush  3 mL Intravenous Q12H  . spironolactone  12.5 mg Oral Daily    Infusions: . sodium chloride    . furosemide 120 mg (07/23/18 0607)    PRN Medications: sodium chloride, acetaminophen, albuterol, ondansetron (ZOFRAN) IV, sodium chloride flush    Patient Profile   Kristopher [name] is a 68 year old with history of obesity, HTN, DM,  asthma and chronic diastolic/systolic heart failure due to NICM with EF 45%.   Assessment/Plan  1. Acute on chronic systolic/diastolic HF - Echo 01/07/17 EF 09%. Cath with minimal CAD - Echo 07/22/18: EF 25-30%, global HK, RV normal function, increased wall thickness, LA moderately dilated, RA severely dilated - Volume remains elevated. Creatinine starting to trend up 1.31 -> 1.43 - Continue lasix 120 mg BID + diamox 500 mg twice a day.  - Continue Entresto 49/51 mg BID and spiro 12.5 mg daily - Hold b-blocker with active wheezing - Continue ted hose.  - Cardiac rehab following - Will likely need RHC. With drop in EF, may need to consider LHC as well. He cannot currently lie flat.   2. Acute hypoxic respiratory failure - Likely combination of HF and AECOPD - Diurese. - Started on prednisone per primary.  - Significant desats with movement on 4L. He was not on O2 PTA. Quit smoking 20 years ago.   3. Undiagnosed OSA - Will need outpatient sleep study. No change.   4. DM2 - Per primary team - Consider SGLT2i at d/c. No change.   5. AKI - Suspect will improve with diuresis - Creatinine trending back up 1.5>1.3>1.4    Length of Stay: 2  Alford Highland, NP  07/23/2018, 7:26 AM  Advanced Heart Failure Team Pager [phone] (M-F; 7a - 4p)   Please contact [facility] for night-coverage after hours (4p -7a ) and weekends on [email or web address]  Patient seen and examined with the above-signed Advanced Practice Provider and/or Housestaff. I personally reviewed laboratory data, imaging studies and relevant notes. I independently examined the patient and formulated the important aspects of the plan. I have edited the note to reflect any of my changes or salient points. I have personally discussed the plan with the patient and/or family.  He remains on high-dose diuretics but just modest urine output. Echo reviewed personally and EF now down to ~30%. RV mildly HK. Markedly volume overloaded on  exam. Will add metolazone. Plan R/L cath in am. I suspect he will need milrinone. Suspect he has severe OSA as well.   Arvilla Meres, MD  1:45 PM

## 2018-07-23 NOTE — Progress Notes (Signed)
CARDIAC REHAB PHASE I   PRE:  Rate/Rhythm: 88 SR  BP:  Supine:   Sitting: 134/70  Standing:    SaO2: 93%4L  MODE:  Ambulation: 190 ft   POST:  Rate/Rhythm: 102 ST  BP:  Supine:   Sitting: 142/76  Standing:    SaO2: 90% 6L 2334-3568 Had pt walk slower and increased oxygen to 6L. Pt able to walk 190 ft. Not as dyspneic and no c/o tightness in chest. Back to chair after walk. Back to 4L. Tolerated better today but on higher oxygen.   Luetta Nutting, RN BSN  07/23/2018 11:34 AM

## 2018-07-23 NOTE — Care Management Note (Signed)
Case Management Note  Patient Details  Name: Kristopher [name] MRN: [id number] Date of Birth: 1951/03/17  Subjective/Objective:    CHF               Action/Plan: Patient lives with son; PCP: Shirlean Mylar, MD; has private insurance with [facility] with prescription drug coverage; pharmacy of choice is [facility]; DME - nebulizer machine; patient states that he continues to drive, no equipment needed for walking; no needs identified at this time; CM will continue to follow for progression of care.  Expected Discharge Date:    possibly 07/27/2018              Expected Discharge Plan:  Home/Self Care  Discharge planning Services  CM Consult  Status of Service:  In process, will continue to follow  Reola Mosher [phone] 07/23/2018, 10:24 AM

## 2018-07-24 ENCOUNTER — Encounter (HOSPITAL_COMMUNITY)
Admission: EM | Disposition: A | Discharge status: Skilled Nursing Facility | Payer: Self-pay | Source: Home / Self Care | Attending: Pulmonary Disease

## 2018-07-24 ENCOUNTER — Inpatient Hospital Stay (HOSPITAL_COMMUNITY): Payer: Medicare Other

## 2018-07-24 LAB — BASIC METABOLIC PANEL
Anion gap: 11 (ref 5–15)
BUN: 24 mg/dL — ABNORMAL HIGH (ref 8–23)
CO2: 35 mmol/L — ABNORMAL HIGH (ref 22–32)
Calcium: 9.5 mg/dL (ref 8.9–10.3)
Chloride: 93 mmol/L — ABNORMAL LOW (ref 98–111)
Creatinine, Ser: 1.43 mg/dL — ABNORMAL HIGH (ref 0.61–1.24)
GFR calc Af Amer: 58 mL/min — ABNORMAL LOW (ref >60)
GFR calc non Af Amer: 50 mL/min — ABNORMAL LOW (ref >60)
Glucose, Bld: 108 mg/dL — ABNORMAL HIGH (ref 70–99)
POTASSIUM: 4 mmol/L (ref 3.5–5.1)
Sodium: 139 mmol/L (ref 135–145)

## 2018-07-24 LAB — CBC
HCT: 51.7 % (ref 39.0–52.0)
HEMOGLOBIN: 14.7 g/dL (ref 13.0–17.0)
MCH: 25.6 pg — ABNORMAL LOW (ref 26.0–34.0)
MCHC: 28.4 g/dL — ABNORMAL LOW (ref 30.0–36.0)
MCV: 90.1 fL (ref 80.0–100.0)
NRBC: 0 % (ref 0.0–0.2)
Platelets: 357 10*3/uL (ref 150–400)
RBC: 5.74 MIL/uL (ref 4.22–5.81)
RDW: 19.3 % — ABNORMAL HIGH (ref 11.5–15.5)
WBC: 12.6 10*3/uL — ABNORMAL HIGH (ref 4.0–10.5)

## 2018-07-24 LAB — MAGNESIUM: Magnesium: 2.4 mg/dL (ref 1.7–2.4)

## 2018-07-24 LAB — BLOOD GAS, ARTERIAL
Acid-Base Excess: 13.4 mmol/L — ABNORMAL HIGH (ref 0.0–2.0)
Bicarbonate: 40.4 mmol/L — ABNORMAL HIGH (ref 20.0–28.0)
Drawn by: 213381
O2 Content: 5 L/min
O2 SAT: 94.8 %
PCO2 ART: 88.2 mmHg — AB (ref 32.0–48.0)
Patient temperature: 98.6
pH, Arterial: 7.283 — ABNORMAL LOW (ref 7.350–7.450)
pO2, Arterial: 84.6 mmHg (ref 83.0–108.0)

## 2018-07-24 SURGERY — RIGHT/LEFT HEART CATH AND CORONARY ANGIOGRAPHY
Anesthesia: LOCAL

## 2018-07-24 MED ORDER — POTASSIUM CHLORIDE CRYS ER 20 MEQ PO TBCR
40.0000 meq | EXTENDED_RELEASE_TABLET | Freq: Once | ORAL | Status: AC
  Administered 2018-07-24: 40 meq via ORAL
  Filled 2018-07-24: qty 2

## 2018-07-24 MED ORDER — IPRATROPIUM BROMIDE 0.02 % IN SOLN
0.5000 mg | Freq: Four times a day (QID) | RESPIRATORY_TRACT | Status: DC
  Administered 2018-07-25: 0.5 mg via RESPIRATORY_TRACT
  Filled 2018-07-24: qty 2.5

## 2018-07-24 MED ORDER — LEVALBUTEROL HCL 0.63 MG/3ML IN NEBU
0.6300 mg | INHALATION_SOLUTION | Freq: Four times a day (QID) | RESPIRATORY_TRACT | Status: DC
  Administered 2018-07-25: 0.63 mg via RESPIRATORY_TRACT
  Filled 2018-07-24: qty 3

## 2018-07-24 MED ORDER — GUAIFENESIN-DM 100-10 MG/5ML PO SYRP
15.0000 mL | ORAL_SOLUTION | ORAL | Status: DC | PRN
  Administered 2018-07-26: 15 mL via ORAL
  Filled 2018-07-24: qty 15

## 2018-07-24 MED ORDER — LEVALBUTEROL HCL 0.63 MG/3ML IN NEBU
0.6300 mg | INHALATION_SOLUTION | Freq: Four times a day (QID) | RESPIRATORY_TRACT | Status: DC
  Administered 2018-07-24 (×2): 0.63 mg via RESPIRATORY_TRACT
  Filled 2018-07-24 (×2): qty 3

## 2018-07-24 MED ORDER — METHYLPREDNISOLONE SODIUM SUCC 40 MG IJ SOLR
40.0000 mg | Freq: Two times a day (BID) | INTRAMUSCULAR | Status: DC
  Administered 2018-07-24 – 2018-07-25 (×3): 40 mg via INTRAVENOUS
  Filled 2018-07-24 (×3): qty 1

## 2018-07-24 MED ORDER — IPRATROPIUM BROMIDE 0.02 % IN SOLN
0.5000 mg | Freq: Four times a day (QID) | RESPIRATORY_TRACT | Status: DC
  Administered 2018-07-24 (×2): 0.5 mg via RESPIRATORY_TRACT
  Filled 2018-07-24 (×2): qty 2.5

## 2018-07-24 NOTE — Progress Notes (Signed)
Pt arrived to 4E from [facility] 3E. Vitals obtained. Telemetry applied and CCMD notified. Pt oriented to room and staff. Son at bedside. Pt placed on bipap by respiratory therapy. Pt agreed to wear it for at least 2 hours. Pt denies needs at this time. Will continue current plan of care.

## 2018-07-24 NOTE — Progress Notes (Signed)
PROGRESS NOTE    Kristopher [name]  HWE:[id number] DOB: 1951/02/20 DOA: 07/21/2018 PCP: Shirlean Mylar, MD  Brief Narrative: 68 year old male with chronic systolic and diastolic CHF, EF of 45%, history of borderline diabetes, tobacco abuse, asthma/COPD presented to the ED with progressive dyspnea over 2 weeks associated with lower extremity edema orthopnea PND and 30 pound weight gain. -Patient reports compliance with medications -In addition reports a recent URI cough and wheezing   Subjective: Remains short of breath.  Somewhat confused per family.  No nausea no vomiting.  No fever no chills.  Assessment & Plan: Acute on chronic combined systolic and diastolic congestive heart failure,  NYHA class 4 (HCC) -EF 45%, and ICM -30 pound weight gain -Left heart cath 3/201 8 with minimal nonobstructive CAD -Heart failure team following, appreciate input, IV Lasix -Follow-up repeat echo -Monitor I's/O weights -Diet education  Acute hypoxic and hypercarbic respiratory failure. Acute COPD exacerbation Undiagnosed OSA. Morbid obesity with obesity hypoventilation syndrome Body mass index is 39.06 kg/m.  Expiratory wheezing on examination. ABG shows compensated hypercarbia although CO2 is higher than his last CO2. Patient is also more drowsy and sleepy. Per family more confused. We will transfer to stepdown unit and placed on BiPAP PRN. Continue BiPAP nightly. Increase steroids from 20 mg daily to 40 mg every 12 hours.  Prediabetes. Hyperglycemia. Hemoglobin A1c 6.0. Continue sliding scale insulin.  Acute kidney injury -Likely cardiorenal, could improve with diuresis  PAH -Could be from underlying chronic lung disease, CHF and possible sleep apnea -Needs sleep study as outpatient  DVT prophylaxis: Heparin subcutaneous Code Status: Full code Family Communication: No family at bedside Disposition Plan: Home pending adequate diuresis  Consultants:   Heart failure  team   Procedures:   Antimicrobials:   Objective: Vitals:   07/24/18 1435 07/24/18 1534 07/24/18 1546 07/24/18 1732  BP: (!) 91/53 93/64    Pulse: 100 91    Resp: (!) 24 (!) 25    Temp: 97.6 F (36.4 C) 97.7 F (36.5 C)    TempSrc: Oral Axillary    SpO2: 97% 95% 93% 97%  Weight:      Height:        Intake/Output Summary (Last 24 hours) at 07/24/2018 1832 Last data filed at 07/24/2018 1700 Gross per 24 hour  Intake 836.5 ml  Output 2400 ml  Net -1563.5 ml   Filed Weights   07/22/18 0156 07/23/18 0527 07/24/18 8938  Weight: 114.2 kg 114.1 kg 109.8 kg    Examination:  General exam: Appears calm and comfortable  Respiratory system: Poor air movement, no wheezing, fine basilar crackles Cardiovascular system: S1 & S2 heard, RRR.  Gastrointestinal system: Abdomen is nondistended, soft and nontender.Normal bowel sounds heard. Central nervous system: Alert and oriented. No focal neurological deficits. Extremities: 2+ edema  skin: No rashes, lesions or ulcers Psychiatry: Judgement and insight appear normal. Mood & affect appropriate.     Data Reviewed:   CBC: Recent Labs  Lab 07/21/18 1123 07/24/18 0535  WBC 10.1 12.6*  HGB 13.7 14.7  HCT 49.9 51.7  MCV 90.7 90.1  PLT 380 357   Basic Metabolic Panel: Recent Labs  Lab 07/21/18 1123 07/22/18 0451 07/23/18 0421 07/24/18 0535  NA 140 140 136 139  K 4.3 4.5 4.3 4.0  CL 94* 89* 89* 93*  CO2 35* 40* 34* 35*  GLUCOSE 113* 142* 102* 108*  BUN 16 14 22  24*  CREATININE 1.50* 1.31* 1.43* 1.43*  CALCIUM 8.6* 9.1 9.4 9.5  MG  --  2.3  --  2.4   GFR: Estimated Creatinine Clearance: 58.3 mL/min (A) (by C-G formula based on SCr of 1.43 mg/dL (H)). Liver Function Tests: Recent Labs  Lab 07/21/18 1123  AST 17  ALT 11  ALKPHOS 55  BILITOT 1.3*  PROT 6.4*  ALBUMIN 3.5   No results for input(s): LIPASE, AMYLASE in the last 168 hours. No results for input(s): AMMONIA in the last 168 hours. Coagulation  Profile: No results for input(s): INR, PROTIME in the last 168 hours. Cardiac Enzymes: No results for input(s): CKTOTAL, CKMB, CKMBINDEX, TROPONINI in the last 168 hours. BNP (last 3 results) No results for input(s): PROBNP in the last 8760 hours. HbA1C: Recent Labs    07/22/18 0955  HGBA1C 6.0*   CBG: No results for input(s): GLUCAP in the last 168 hours. Lipid Profile: No results for input(s): CHOL, HDL, LDLCALC, TRIG, CHOLHDL, LDLDIRECT in the last 72 hours. Thyroid Function Tests: No results for input(s): TSH, T4TOTAL, FREET4, T3FREE, THYROIDAB in the last 72 hours. Anemia Panel: No results for input(s): VITAMINB12, FOLATE, FERRITIN, TIBC, IRON, RETICCTPCT in the last 72 hours. Urine analysis: No results found for: COLORURINE, APPEARANCEUR, LABSPEC, PHURINE, GLUCOSEU, HGBUR, BILIRUBINUR, KETONESUR, PROTEINUR, UROBILINOGEN, NITRITE, LEUKOCYTESUR  Radiology Studies: Dg Chest Port 1 View  Result Date: 07/24/2018 CLINICAL DATA:  Shortness of breath EXAM: PORTABLE CHEST 1 VIEW COMPARISON:  Chest radiograph 07/21/2018 FINDINGS: Monitoring leads overlie the patient. Low lung volumes. Stable cardiomegaly. Heterogeneous opacities left lung base. Possible small left pleural effusion. IMPRESSION: Heterogeneous opacities left lung base may represent atelectasis or infection. Possible small left effusion. Electronically Signed   By: Drew  Davis M.D.   On: 07/24/2018 10:54   Scheduled Meds: . acetaZOLAMIDE  500 mg Oral Q12H  . aspirin EC  81 mg Oral Daily  . heparin  5,000 Units Subcutaneous Q8H  . ipratropium  0.5 mg Nebulization Q6H  . levalbuterol  0.63 mg Nebulization Q6H  . methylPREDNISolone (SOLU-MEDROL) injection  40 mg Intravenous BID  . metolazone  5 mg Oral BID  . sacubitril-valsartan  1 tablet Oral BID  . sodium chloride flush  3 mL Intravenous Q12H  . spironolactone  12.5 mg Oral Daily   Continuous Infusions: . sodium chloride    . sodium chloride Stopped (07/24/18 1232)   . furosemide Stopped (07/24/18 0631)     LOS: 3 days    Time spent: <MEASUREMAdvent<MEASUREMENWest O<MEASUREMEN[facility] <MEASUREMENCrestwood Psychiatric Health Facil<MEASUREMENRoger William<MEASUREMENCar[place] Me<MEASUREMENCarilion<MEASUREMENUs [facility]<MEASUREMEN[facility]<MEASUREMEN[facility] <MEASUREMENPosada Ambulatory Surg<MEASUREMENCigna Outpatient Sur<MEASUREMEN[facility]<MEASUREMENSaint Barnabas Behaviora<MEASUREMENStormont Va<MEASUREMENEdgewood Surgi<MEASUREMEN[facility]<MEASUREMEN[facility]<MEASUREMENBhc Fa<MEASUREMENRidge[facility] Medi<MEASUREMENPlateau Medi<MEASUREMENSurgicare Surgical Associates Of Ma<MEASUREMENVibra Hospital Of Northwester<MEASUREMEN[facility] <MEASUREMENPioneer Ambulatory Surg<MEASUREMEN[facility] Chr<MEASUREMENMadonna Rehabil<MEASUREMEN[facility] <MEASUREMEN[place] Co<MEASUREMENRoy Lester Schnei<MEASUREMEN[facility] Medi<MEASUREMEN[facility] E<MEASUREMENAdvanced S<MEASUREMENThe [facility] C<MEASUREMENSarah Bush Lincoln HealthMosetta Pigeonust5Public house managerd Hospitalist 07/24/2018   To reach On-call, see care teams to locate the attending and reach out to them via [email or web address]. If 7PM-7AM, please contact night-coverage If you still have difficulty reaching the attending provider, please page the DOC (Director on Call) for [facility] on amion for assistance.  07/24/2018, 6:32 PM

## 2018-07-24 NOTE — Progress Notes (Addendum)
Advanced Heart Failure Rounding Note  PCP-Cardiologist: Armanda Magic, MD   Subjective:    Received 120 mg IV lasix BID + 5 mg metolazone BID + diamox yesterday. Brisk diuresis. Net negative 2.2 L. Creatinine stable 1.43. Weight down 9 lbs. Bicarb 35  Walked with cardiac rehab yesterday. O2 sats 90% on 6L. On prednisone for COPD exacerbation. Desat to 75% on RA this morning, improved with O2.  Had SOB and CP when ambulating yesterday. Very drowsy this morning. Discussed cath at length. Sleeping sitting up, but tolerating putting HOB down.   Going for [facility] this am.  Echo 07/22/18: EF 25-30%, global HK, RV normal function, increased wall thickness, LA moderately dilated, RA severely dilated  Objective:   Weight Range: 109.8 kg Body mass index is 39.06 kg/m.   Vital Signs:   Temp:  [97.8 F (36.6 C)-99.5 F (37.5 C)] 97.8 F (36.6 C) (02/13 0800) Pulse Rate:  [89-106] 106 (02/13 0800) Resp:  [18-21] 20 (02/13 0800) BP: (99-132)/(47-70) 99/52 (02/13 0800) SpO2:  [75 %-94 %] 92 % (02/13 0910) Weight:  [109.8 kg] 109.8 kg (02/13 0213) Last BM Date: 07/23/18  Weight change: Filed Weights   07/22/18 0156 07/23/18 0527 07/24/18 0213  Weight: 114.2 kg 114.1 kg 109.8 kg    Intake/Output:   Intake/Output Summary (Last 24 hours) at 07/24/2018 0936 Last data filed at 07/24/2018 0800 Gross per 24 hour  Intake 310.83 ml  Output 3050 ml  Net -2739.17 ml      Physical Exam    General: Sitting in chair. No resp difficulty. Drowsy. HEENT: Normal Neck: Supple. JVP to jaw. Carotids 2+ bilat; no bruits. No thyromegaly or nodule noted. Cor: PMI nondisplaced. RRR, No M/G/R noted Lungs: wheezing.  Abdomen: Soft, non-tender, non-distended, no HSM. No bruits or masses. +BS  Extremities: No cyanosis, clubbing, or rash. R and LLE 1+ edema. BLE TED hose. Neuro: Alert & orientedx3, cranial nerves grossly intact. moves all 4 extremities w/o difficulty. Affect pleasant   Telemetry     NSR/sinus tach 90-100s. Personally reviewed.   EKG    No new tracings.   Labs    CBC Recent Labs    07/21/18 1123 07/24/18 0535  WBC 10.1 12.6*  HGB 13.7 14.7  HCT 49.9 51.7  MCV 90.7 90.1  PLT 380 357   Basic Metabolic Panel Recent Labs    02/77/41 0451 07/23/18 0421 07/24/18 0535  NA 140 136 139  K 4.5 4.3 4.0  CL 89* 89* 93*  CO2 40* 34* 35*  GLUCOSE 142* 102* 108*  BUN 14 22 24*  CREATININE 1.31* 1.43* 1.43*  CALCIUM 9.1 9.4 9.5  MG 2.3  --   --    Liver Function Tests Recent Labs    07/21/18 1123  AST 17  ALT 11  ALKPHOS 55  BILITOT 1.3*  PROT 6.4*  ALBUMIN 3.5   No results for input(s): LIPASE, AMYLASE in the last 72 hours. Cardiac Enzymes No results for input(s): CKTOTAL, CKMB, CKMBINDEX, TROPONINI in the last 72 hours.  BNP: BNP (last 3 results) Recent Labs    07/21/18 1123  BNP 964.4*    ProBNP (last 3 results) No results for input(s): PROBNP in the last 8760 hours.   D-Dimer No results for input(s): DDIMER in the last 72 hours. Hemoglobin A1C Recent Labs    07/22/18 0955  HGBA1C 6.0*   Fasting Lipid Panel No results for input(s): CHOL, HDL, LDLCALC, TRIG, CHOLHDL, LDLDIRECT in the last 72 hours. Thyroid  Function Tests No results for input(s): TSH, T4TOTAL, T3FREE, THYROIDAB in the last 72 hours.  Invalid input(s): FREET3  Other results:   Imaging    No results found.   Medications:     Scheduled Medications: . acetaZOLAMIDE  500 mg Oral Q12H  . aspirin EC  81 mg Oral Daily  . heparin  5,000 Units Subcutaneous Q8H  . ipratropium-albuterol  3 mL Nebulization Q6H  . metolazone  5 mg Oral BID  . predniSONE  20 mg Oral Q breakfast  . sacubitril-valsartan  1 tablet Oral BID  . sodium chloride flush  3 mL Intravenous Q12H  . spironolactone  12.5 mg Oral Daily    Infusions: . sodium chloride    . sodium chloride 10 mL/hr at 07/24/18 0533  . furosemide 120 mg (07/24/18 0537)    PRN Medications: sodium  chloride, acetaminophen, albuterol, ondansetron (ZOFRAN) IV, sodium chloride flush    Patient Profile   Mr Haught is a 68 year old with history of obesity, HTN, DM, asthma and chronic diastolic/systolic heart failure due to NICM with EF 45%.   Assessment/Plan  1. Acute on chronic systolic/diastolic HF - Echo 01/07/17 EF 24%. Cath with minimal CAD - Echo 07/22/18: EF 25-30%, global HK, RV normal function, increased wall thickness, LA moderately dilated, RA severely dilated - Volume remains elevated. Creatinine unchanged 1.43 - Continue lasix 120 mg BID + diamox 500 mg twice a day + 5 mg metolazone BID. - Continue Entresto 49/51 mg BID and spiro 12.5 mg daily - Hold b-blocker with active wheezing - Continue ted hose.  - Cardiac rehab following - R/LHC today.  2. Acute hypoxic respiratory failure - Likely combination of HF and AECOPD - Diurese. - On prednisone per primary for COPD exacerbation. Afebrile. WBC 12.6. Getting scheduled nebs, but still wheezing.  - Significant desats with movement on 4L. He was not on O2 PTA. Quit smoking 20 years ago. Will likely need O2 at DC.  3. Undiagnosed OSA - Will need outpatient sleep study. No change.   4. DM2 - Per primary team - Consider SGLT2i at d/c. No change.   5. AKI - Suspect will improve with diuresis - Creatinine 1.5>1.3>1.4 > 1.4   Length of Stay: 3  Alford Highland, NP  07/24/2018, 9:36 AM  Advanced Heart Failure Team Pager [phone] (M-F; 7a - 4p)   Please contact [facility] for night-coverage after hours (4p -7a ) and weekends on [email or web address]  Patient seen and examined with the above-signed Advanced Practice Provider and/or Housestaff. I personally reviewed laboratory data, imaging studies and relevant notes. I independently examined the patient and formulated the important aspects of the plan. I have edited the note to reflect any of my changes or salient points. I have personally discussed the plan with the patient  and/or family.  Diuresis now picking up on high-dose lasix, metolazone and diamox. Unfortunately developed respiratory distress. ABG shows CO2 retention with respiratory acidosis. Will need Bipap. Will postpone cath for today. D/w Dr. Allena Katz.   Arvilla Meres, MD  6:06 PM

## 2018-07-24 NOTE — Plan of Care (Signed)
  Problem: Education: Goal: Knowledge of General Education information will improve Description Including pain rating scale, medication(s)/side effects and non-pharmacologic comfort measures Outcome: Progressing   

## 2018-07-24 NOTE — Plan of Care (Signed)
  Problem: Education: Goal: Ability to demonstrate management of disease process will improve Outcome: Progressing   Problem: Education: Goal: Ability to verbalize understanding of medication therapies will improve Outcome: Progressing   

## 2018-07-24 NOTE — Care Management Important Message (Signed)
Important Message  Patient Details  Name: Kristopher [name] MRN: [id number] Date of Birth: July 08, 1950   Medicare Important Message Given:  Yes    Susano Cleckler P Rolande Moe 07/24/2018, 1:08 PM

## 2018-07-24 NOTE — Progress Notes (Signed)
   Vital Signs MEWS/VS Documentation      07/24/2018 0800 07/24/2018 0830 07/24/2018 0855 07/24/2018 0910   MEWS Score:  3  3  -  -   MEWS Score Color:  Yellow  Yellow  -  -   Resp:  20  -  -  -   Pulse:  (!) 106  -  -  -   BP:  (!) 99/52  -  -  -   Temp:  97.8 F (36.6 C)  -  -  -   O2 Device:  Room Air  -  Room Air  Nasal Cannula   O2 Flow Rate (L/min):  -  -  -  5 L/min   Level of Consciousness:  Responds to Voice  Responds to Voice  -  -           Jeanella Flattery 07/24/2018,9:28 AM

## 2018-07-25 DIAGNOSIS — J9602 Acute respiratory failure with hypercapnia: Secondary | ICD-10-CM

## 2018-07-25 DIAGNOSIS — J9601 Acute respiratory failure with hypoxia: Secondary | ICD-10-CM

## 2018-07-25 LAB — GLUCOSE, CAPILLARY
Glucose-Capillary: 118 mg/dL — ABNORMAL HIGH (ref 70–99)
Glucose-Capillary: 168 mg/dL — ABNORMAL HIGH (ref 70–99)
Glucose-Capillary: 131 mg/dL — ABNORMAL HIGH (ref 70–99)
Glucose-Capillary: 131 mg/dL — ABNORMAL HIGH (ref 70–99)

## 2018-07-25 LAB — BLOOD GAS, ARTERIAL
Acid-Base Excess: 11.3 mmol/L — ABNORMAL HIGH (ref 0.0–2.0)
Acid-Base Excess: 11.3 mmol/L — ABNORMAL HIGH (ref 0.0–2.0)
Bicarbonate: 39.3 mmol/L — ABNORMAL HIGH (ref 20.0–28.0)
Bicarbonate: 38 mmol/L — ABNORMAL HIGH (ref 20.0–28.0)
Delivery systems: POSITIVE
Delivery systems: POSITIVE
Drawn by: 406621
Drawn by: 365271
Expiratory PAP: 6
Expiratory PAP: 6
FIO2: 60
FIO2: 50
Inspiratory PAP: 12
Inspiratory PAP: 15
LHR: 18 {breaths}/min
Mode: POSITIVE
O2 SAT: 98.3 %
O2 Saturation: 94.6 %
PATIENT TEMPERATURE: 98.6
PCO2 ART: 81.9 mmHg — AB (ref 32.0–48.0)
PH ART: 7.207 — AB (ref 7.350–7.450)
Patient temperature: 98.6
RATE: 12 resp/min
pCO2 arterial: 103 mmHg (ref 32.0–48.0)
pH, Arterial: 7.289 — ABNORMAL LOW (ref 7.350–7.450)
pO2, Arterial: 137 mmHg — ABNORMAL HIGH (ref 83.0–108.0)
pO2, Arterial: 79.7 mmHg — ABNORMAL LOW (ref 83.0–108.0)

## 2018-07-25 LAB — BASIC METABOLIC PANEL
Anion gap: 12 (ref 5–15)
BUN: 32 mg/dL — ABNORMAL HIGH (ref 8–23)
CO2: 36 mmol/L — ABNORMAL HIGH (ref 22–32)
Calcium: 9.6 mg/dL (ref 8.9–10.3)
Chloride: 90 mmol/L — ABNORMAL LOW (ref 98–111)
Creatinine, Ser: 1.58 mg/dL — ABNORMAL HIGH (ref 0.61–1.24)
GFR calc Af Amer: 52 mL/min — ABNORMAL LOW (ref >60)
GFR calc non Af Amer: 45 mL/min — ABNORMAL LOW (ref >60)
Glucose, Bld: 149 mg/dL — ABNORMAL HIGH (ref 70–99)
POTASSIUM: 4.3 mmol/L (ref 3.5–5.1)
Sodium: 138 mmol/L (ref 135–145)

## 2018-07-25 LAB — MRSA PCR SCREENING: MRSA by PCR: NEGATIVE

## 2018-07-25 MED ORDER — IPRATROPIUM BROMIDE 0.02 % IN SOLN: 0.5000 mg | RESPIRATORY_TRACT | Status: DC | PRN

## 2018-07-25 MED ORDER — CHLORHEXIDINE GLUCONATE 0.12 % MT SOLN
15.0000 mL | Freq: Two times a day (BID) | OROMUCOSAL | Status: DC
  Administered 2018-07-25 (×2): 15 mL via OROMUCOSAL
  Filled 2018-07-25: qty 15

## 2018-07-25 MED ORDER — HALOPERIDOL LACTATE 5 MG/ML IJ SOLN
5.0000 mg | Freq: Once | INTRAMUSCULAR | Status: AC
  Administered 2018-07-25: 5 mg via INTRAVENOUS
  Filled 2018-07-25: qty 1

## 2018-07-25 MED ORDER — ORAL CARE MOUTH RINSE
15.0000 mL | Freq: Two times a day (BID) | OROMUCOSAL | Status: DC
  Administered 2018-07-25 – 2018-07-26 (×3): 15 mL via OROMUCOSAL

## 2018-07-25 MED ORDER — LEVALBUTEROL HCL 0.63 MG/3ML IN NEBU
0.6300 mg | INHALATION_SOLUTION | Freq: Four times a day (QID) | RESPIRATORY_TRACT | Status: DC | PRN
  Administered 2018-08-04 – 2018-08-07 (×3): 0.63 mg via RESPIRATORY_TRACT
  Filled 2018-07-25: qty 3

## 2018-07-25 MED ORDER — FUROSEMIDE 10 MG/ML IJ SOLN
60.0000 mg | Freq: Two times a day (BID) | INTRAMUSCULAR | Status: DC
  Administered 2018-07-25 (×2): 60 mg via INTRAVENOUS
  Filled 2018-07-25 (×2): qty 6

## 2018-07-25 NOTE — Progress Notes (Signed)
Low minute volume alarm on bipap. Pt desat high 80s on 50% fio2. Increased to 60% fio2, o2 sat 96-97%. RT updated & will assess at bedside.

## 2018-07-25 NOTE — Plan of Care (Signed)
  Problem: Education: Goal: Ability to demonstrate management of disease process will improve Outcome: Progressing Goal: Ability to verbalize understanding of medication therapies will improve Outcome: Progressing   Problem: Cardiac: Goal: Ability to achieve and maintain adequate cardiopulmonary perfusion will improve Outcome: Progressing   Problem: Education: Goal: Knowledge of General Education information will improve Description Including pain rating scale, medication(s)/side effects and non-pharmacologic comfort measures Outcome: Progressing   Problem: Health Behavior/Discharge Planning: Goal: Ability to manage health-related needs will improve Outcome: Progressing   Problem: Nutrition: Goal: Adequate nutrition will be maintained Outcome: Progressing   Problem: Coping: Goal: Level of anxiety will decrease Outcome: Progressing   Problem: Pain Managment: Goal: General experience of comfort will improve Outcome: Progressing   Problem: Skin Integrity: Goal: Risk for impaired skin integrity will decrease Outcome: Progressing
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
## 2018-07-25 NOTE — Progress Notes (Signed)
Removed restraints due to Bipap protocol of patieny can't be in restraints on bipap. Patient calmer now will monitor closely
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
## 2018-07-25 NOTE — Progress Notes (Signed)
Critical ABG called RN at 1415.

## 2018-07-25 NOTE — Progress Notes (Signed)
Pt not responsive to verbal stimulation, still on BiPap, SpO2 98%%, however as per respiratory, results from blood gas are as follows:  PH: 7.2 PCO2: 103 CHCO3: 39.3  MD notified and is going to contact CCM.

## 2018-07-25 NOTE — Progress Notes (Signed)
PROGRESS NOTE    Kristopher [name]  ZRA:[id number] DOB: 1951-03-08 DOA: 07/21/2018 PCP: Shirlean Mylar, MD  Brief Narrative: 68 year old male with chronic systolic and diastolic CHF, EF of 45%, history of borderline diabetes, tobacco abuse, asthma/COPD presented to the ED with progressive dyspnea over 2 weeks associated with lower extremity edema orthopnea PND and 30 pound weight gain. -Patient reports compliance with medications -In addition reports a recent URI cough and wheezing   Subjective: More lethargic.  No nausea no vomiting no fever no chills.  No acute complaint.  Overnight became more agitated.  Punched 1 of the staff.  Assessment & Plan: Acute on chronic combined systolic and diastolic congestive heart failure,  NYHA class 4 (HCC) -EF 45%, and ICM -30 pound weight gain -Left heart cath 3/201 8 with minimal nonobstructive CAD -Heart failure team following, appreciate input, IV Lasix -Follow-up repeat echo -Monitor I's/O weights -Diet education  Acute hypoxic and hypercarbic respiratory failure. Acute COPD exacerbation Undiagnosed OSA. Morbid obesity with obesity hypoventilation syndrome Body mass index is 38.3 kg/m.  Expiratory wheezing on examination. ABG shows acute hypercarbia uncompensated. CCM consulted and patient was transferred to ICU. Management per CCM.  Appreciate their assistance.  Prediabetes. Hyperglycemia. Hemoglobin A1c 6.0. Continue sliding scale insulin.  Acute kidney injury -Likely cardiorenal, improving with diuresis  PAH -Could be from underlying chronic lung disease, CHF and possible sleep apnea -Needs sleep study as outpatient  DVT prophylaxis: Heparin subcutaneous Code Status: Full code Family Communication: No family at bedside Disposition Plan: Home pending adequate diuresis  Consultants:   Heart failure team   Procedures:   Antimicrobials:   Objective: Vitals:   07/25/18 1500 07/25/18 1600 07/25/18 1623 07/25/18 1700  BP:  (!) 107/58 (!) 109/54  107/67  Pulse: 85 90  99  Resp: (!) 31 (!) 31  (!) 24  Temp:   98.6 F (37 C)   TempSrc:   Oral   SpO2: 96% 91%  95%  Weight:      Height:        Intake/Output Summary (Last 24 hours) at 07/25/2018 1839 Last data filed at 07/25/2018 1700 Gross per 24 hour  Intake 723 ml  Output 2370 ml  Net -1647 ml   Filed Weights   07/23/18 0527 07/24/18 0213 07/25/18 0830  Weight: 114.1 kg 109.8 kg 107.6 kg    Examination:  General exam: Drowsy and lethargic, unable to follow any commands. Respiratory system: Poor air movement, bilateral wheezing, fine basilar crackles Cardiovascular system: S1 & S2 heard, RRR.  Gastrointestinal system: Abdomen is nondistended, soft and nontender.Normal bowel sounds heard. Central nervous system: No focal neurological deficits. Extremities: 2+ edema  skin: No rashes, lesions or ulcers   Data Reviewed:   CBC: Recent Labs  Lab 07/21/18 1123 07/24/18 0535  WBC 10.1 12.6*  HGB 13.7 14.7  HCT 49.9 51.7  MCV 90.7 90.1  PLT 380 357   Basic Metabolic Panel: Recent Labs  Lab 07/21/18 1123 07/22/18 0451 07/23/18 0421 07/24/18 0535 07/25/18 0357  NA 140 140 136 139 138  K 4.3 4.5 4.3 4.0 4.3  CL 94* 89* 89* 93* 90*  CO2 35* 40* 34* 35* 36*  GLUCOSE 113* 142* 102* 108* 149*  BUN 16 14 22  24* 32*  CREATININE 1.50* 1.31* 1.43* 1.43* 1.58*  CALCIUM 8.6* 9.1 9.4 9.5 9.6  MG  --  2.3  --  2.4  --    GFR: Estimated Creatinine Clearance: 52.2 mL/min (A) (by C-G formula based on SCr  of 1.58 mg/dL (H)). Liver Function Tests: Recent Labs  Lab 07/21/18 1123  AST 17  ALT 11  ALKPHOS 55  BILITOT 1.3*  PROT 6.4*  ALBUMIN 3.5   No results for input(s): LIPASE, AMYLASE in the last 168 hours. No results for input(s): AMMONIA in the last 168 hours. Coagulation Profile: No results for input(s): INR, PROTIME in the last 168 hours. Cardiac Enzymes: No results for input(s): CKTOTAL, CKMB, CKMBINDEX, TROPONINI in the last 168  hours. BNP (last 3 results) No results for input(s): PROBNP in the last 8760 hours. HbA1C: No results for input(s): HGBA1C in the last 72 hours. CBG: Recent Labs  Lab 07/25/18 1159 07/25/18 1625  GLUCAP 118* 168*   Lipid Profile: No results for input(s): CHOL, HDL, LDLCALC, TRIG, CHOLHDL, LDLDIRECT in the last 72 hours. Thyroid Function Tests: No results for input(s): TSH, T4TOTAL, FREET4, T3FREE, THYROIDAB in the last 72 hours. Anemia Panel: No results for input(s): VITAMINB12, FOLATE, FERRITIN, TIBC, IRON, RETICCTPCT in the last 72 hours. Urine analysis: No results found for: COLORURINE, APPEARANCEUR, LABSPEC, PHURINE, GLUCOSEU, HGBUR, BILIRUBINUR, KETONESUR, PROTEINUR, UROBILINOGEN, NITRITE, LEUKOCYTESUR  Radiology Studies: Dg Chest Port 1 View  Result Date: 07/24/2018 CLINICAL DATA:  Shortness of breath EXAM: PORTABLE CHEST 1 VIEW COMPARISON:  Chest radiograph 07/21/2018 FINDINGS: Monitoring leads overlie the patient. Low lung volumes. Stable cardiomegaly. Heterogeneous opacities left lung base. Possible small left pleural effusion. IMPRESSION: Heterogeneous opacities left lung base may represent atelectasis or infection. Possible small left effusion. Electronically Signed   By: Annia Belt M.D.   On: 07/24/2018 10:54   Scheduled Meds: . acetaZOLAMIDE  500 mg Oral Q12H  . aspirin EC  81 mg Oral Daily  . chlorhexidine  15 mL Mouth Rinse BID  . furosemide  60 mg Intravenous Q12H  . heparin  5,000 Units Subcutaneous Q8H  . mouth rinse  15 mL Mouth Rinse q12n4p  . sacubitril-valsartan  1 tablet Oral BID  . sodium chloride flush  3 mL Intravenous Q12H  . spironolactone  12.5 mg Oral Daily   Continuous Infusions: . sodium chloride       LOS: 4 days    Time spent:    Author:  Lynden Oxford, MD [facility] 07/25/2018   To reach On-call, see care teams to locate the attending and reach out to them via [email or web address]. If 7PM-7AM, please contact  night-coverage If you still have difficulty reaching the attending provider, please page the [facility] (Director on Call) for [facility] on amion for assistance.  07/25/2018, 6:39 PM

## 2018-07-25 NOTE — Progress Notes (Signed)
Patient 10AM meds not given as he cannot maintain wakeful state that this RN feels administration is safe.  Will review with CCM MD when they come to see.  Jacqulyn Cane RN, BSN, CCRN

## 2018-07-25 NOTE — Progress Notes (Signed)
Paged with ABG results. PH 7.2 and CO2 103. Contacted CCM and put in transfer to ICU orders. Updated Dr Allena Katz.  Alford Highland, NP

## 2018-07-25 NOTE — Progress Notes (Signed)
RR > 30. Increased WOB with use of abdominals. Pt reports having some issue with breathing & requests bipap. Bipap placed at 50%. RT notified. Assessment ongoing.

## 2018-07-25 NOTE — Consult Note (Signed)
NAME:  Kristopher [name], MRN:  [id number], DOB:  1951/03/31, LOS: 4 ADMISSION DATE:  07/21/2018, CONSULTATION DATE:  07/25/2018 REFERRING MD:  Allena Katz - TRH, CHIEF COMPLAINT:  Hypercapnic respiratory failure.   HPI/course in hospital  68 year old man presented 4 days ago to ED with progressive edema and SOB. Approximately 15kg weight gain.  Known mixed HFREF/HFPEF, COPD.  Admitted to floor initially, then transferred yesterday to stepdown for hypercapnia and increasing lethargy. Started on BiPAP, continued on diuretics and steroids continued for presumed concomitant AECOPD.  Past Medical History   Past Medical History:  Diagnosis Date  . Acute combined systolic and diastolic heart failure (HCC) 08/11/2016  . Acute esophagitis 08/17/2016  . Acute upper GI bleed 08/10/2016  . Asthma   . CAD (coronary artery disease) 08/16/2016  . CHF (congestive heart failure) (HCC)   . DCM (dilated cardiomyopathy) (HCC)   . Dyspnea   . HTN (hypertension)   . LBBB (left bundle branch block) 08/10/2016  . Lymphadenopathy 08/17/2016  . PUD (peptic ulcer disease) 08/17/2016  . Pulmonary HTN (HCC) 08/17/2016     Past Surgical History:  Procedure Laterality Date  . ESOPHAGOGASTRODUODENOSCOPY (EGD) WITH PROPOFOL Left 08/11/2016   Procedure: ESOPHAGOGASTRODUODENOSCOPY (EGD) WITH PROPOFOL;  Surgeon: Willis Modena, MD;  Location: [facility] ENDOSCOPY;  Service: Endoscopy;  Laterality: Left;  . RIGHT/LEFT HEART CATH AND CORONARY ANGIOGRAPHY N/A 08/15/2016   Procedure: Right/Left Heart Cath and Coronary Angiography;  Surgeon: Corky Crafts, MD;  Location: [facility] INVASIVE CV LAB;  Service: Cardiovascular;  Laterality: N/A;     Review of Systems:   Review of Systems  Unable to perform ROS: Mental status change    Social History   reports that he has quit smoking. He has never used smokeless tobacco. He reports that he does not drink alcohol or use drugs.   Family History   His family history includes Hypertension in his mother and  sister.   Allergies Allergies  Allergen Reactions  . Penicillins     Pt reports it makes him feel shaky Has patient had a PCN reaction causing immediate rash, facial/tongue/throat swelling, SOB or lightheadedness with hypotension: YES Has patient had a PCN reaction causing severe rash involving mucus membranes or skin necrosis: NO Has patient had a PCN reaction that required hospitalization NO Has patient had a PCN reaction occurring within the last 10 years: NO If all of the above answers are "NO", then may proceed with Cephalosporin use.     Home Medications  Prior to Admission medications   Medication Sig Start Date End Date Taking? Authorizing Provider  acetaminophen (TYLENOL) 325 MG tablet Take 650 mg by mouth every 6 (six) hours as needed.   Yes [provider]  albuterol (PROVENTIL) (2.5 MG/3ML) 0.083% nebulizer solution Inhale 3 mLs into the lungs every 6 (six) hours as needed for wheezing or shortness of breath. 06/20/18  Yes [provider]  budesonide-formoterol (SYMBICORT) 80-4.5 MCG/ACT inhaler Inhale 1 puff into the lungs 2 (two) times daily. 08/17/16  Yes Rama, Maryruth Bun, MD  carvedilol (COREG) 12.5 MG tablet TAKE 1 TABLET(12.5 MG) BY MOUTH TWICE DAILY WITH A MEAL Patient taking differently: Take 12.5 mg by mouth 2 (two) times daily with a meal.  03/13/18  Yes Turner, Traci R, MD  ferrous sulfate 325 (65 FE) MG tablet Take 325 mg by mouth daily with breakfast.   Yes [provider]  furosemide (LASIX) 40 MG tablet Take 40 mg by mouth daily. 07/18/18  Yes  [provider]  omeprazole (PRILOSEC) 40 MG capsule Take 40 mg by mouth daily. 30 min prior to a meal 07/09/18  Yes [provider]  pantoprazole (PROTONIX) 40 MG tablet Take 1 tablet (40 mg total) by mouth daily. Please make appt for future refills 1st attempt. 07/21/18  Yes Turner, Cornelious Bryant, MD  furosemide (LASIX) 20 MG tablet Take 1 tablet (20 mg total) by mouth daily. Patient not  taking: Reported on 07/21/2018 12/07/16   Quintella Reichert, MD  sacubitril-valsartan (ENTRESTO) 49-51 MG Take 1 tablet by mouth 2 (two) times daily. Please keep upcoming appt in December with Dr. Mayford Knife for future refills. Thank you 03/25/18   Quintella Reichert, MD  spironolactone (ALDACTONE) 25 MG tablet TAKE 1/2 TABLET(12.5 MG) BY MOUTH DAILY Patient not taking: Reported on 07/21/2018 03/13/18   Quintella Reichert, MD     Interim history/subjective:  CCM called to see this morning for worsening mental status with O2 desaturations in spite of BIPAP, which was used only intermittently since transfer to PCU yesterday - 4h during the afternoon and not again until 3:00am until now),  and good response to diuresis yesterday (-2.2 L yesterday - nearly -6 for this hospitalization).    PCO2 climbing to 103 at 8:30 in spite of 5h of BIPAP.  Objective   Blood pressure 113/71, pulse 80, temperature 98.2 F (36.8 C), temperature source Axillary, resp. rate (!) 35, height 5\' 6"  (1.676 m), weight 107.6 kg, SpO2 97 %.    FiO2 (%):  [40 %-60 %] 50 %   Intake/Output Summary (Last 24 hours) at 07/25/2018 1023 Last data filed at 07/25/2018 0000 Gross per 24 hour  Intake 525.67 ml  Output 2275 ml  Net -1749.33 ml   Filed Weights   07/23/18 0527 07/24/18 0213 07/25/18 0830  Weight: 114.1 kg 109.8 kg 107.6 kg    Examination: Physical Exam  Constitutional:  Obese  HENT:  Bearded patient. BiPAP mask in place with audible leak.  Machine estimates 40-50 L/min leak.  Neck:  Short neck, difficult to estimate JVP  Cardiovascular: Normal rate, regular rhythm and normal pulses. Exam reveals distant heart sounds.  Trace edema only.  Respiratory: No accessory muscle usage. No respiratory distress. He has rhonchi.  GI: He exhibits distension. There is no abdominal tenderness.  Genitourinary:    Genitourinary Comments: Condom catheter. 300 mL bladder scan.   Neurological: He is unresponsive.  Moves all limbs  symmetrically.  Skin: Skin is warm and dry.     Ancillary tests (personally reviewed)  CBC: Recent Labs  Lab 07/21/18 1123 07/24/18 0535  WBC 10.1 12.6*  HGB 13.7 14.7  HCT 49.9 51.7  MCV 90.7 90.1  PLT 380 357    Basic Metabolic Panel: Recent Labs  Lab 07/21/18 1123 07/22/18 0451 07/23/18 0421 07/24/18 0535 07/25/18 0357  NA 140 140 136 139 138  K 4.3 4.5 4.3 4.0 4.3  CL 94* 89* 89* 93* 90*  CO2 35* 40* 34* 35* 36*  GLUCOSE 113* 142* 102* 108* 149*  BUN 16 14 22  24* 32*  CREATININE 1.50* 1.31* 1.43* 1.43* 1.58*  CALCIUM 8.6* 9.1 9.4 9.5 9.6  MG  --  2.3  --  2.4  --    GFR: Estimated Creatinine Clearance: 52.2 mL/min (A) (by C-G formula based on SCr of 1.58 mg/dL (H)). Recent Labs  Lab 07/21/18 1123 07/24/18 0535  WBC 10.1 12.6*    Liver Function Tests: Recent Labs  Lab 07/21/18 1123  AST  17  ALT 11  ALKPHOS 55  BILITOT 1.3*  PROT 6.4*  ALBUMIN 3.5   No results for input(s): LIPASE, AMYLASE in the last 168 hours. No results for input(s): AMMONIA in the last 168 hours.  ABG    Component Value Date/Time   PHART 7.207 (L) 07/25/2018 0830   PCO2ART 103 (HH) 07/25/2018 0830   PO2ART 137 (H) 07/25/2018 0830   HCO3 39.3 (H) 07/25/2018 0830   TCO2 33 08/15/2016 1531   O2SAT 98.3 07/25/2018 0830     Coagulation Profile: No results for input(s): INR, PROTIME in the last 168 hours.  Cardiac Enzymes: No results for input(s): CKTOTAL, CKMB, CKMBINDEX, TROPONINI in the last 168 hours.  HbA1C: Hgb A1c MFr Bld  Date/Time Value Ref Range Status  07/22/2018 09:55 AM 6.0 (H) 4.8 - 5.6 % Final    Comment:    (NOTE) Pre diabetes:          5.7%-6.4% Diabetes:              >6.4% Glycemic control for   <7.0% adults with diabetes     CBG: No results for input(s): GLUCAP in the last 168 hours.   Assessment & Plan:  Critically ill due to acute on chronic hypoxic/hypercapnic respiratory failure requiring NIV. Persistent hypercapnia despite BiPAP, but  substantial air leak. Acute decompensated mixed HF Possible COPD based on smoking history but no PFT 's on record. Possible OSA based on body habitus and chronic hypercarbia but no formal PSG available. Acute kidney injury secondary to cardiorenal syndrome.  PLAN:  Optimize BIPAP by creating better face seal: face shaved to improve fit.  Leak now down to 11 L/min. Repeat ABG in 3h - if mental status/hypercarbia is not improved, I will intubate him. Continue diuresis - I will give 60 mg today.  Place Foley catheter to ensure accurate urine output measurement. Goal diuresis approximately - 15 net for stay. Have stopped steroids as does not meet criteria for AECOPD (change in sputum) and is not actively wheezing, and steroids may exacerbate fluid retention, delirium.  Best practice:  Diet: NPO Pain/Anxiety/Delirium protocol (if indicated): no sedation to prevent worsening hypoventilation. VAP protocol (if indicated): N/A DVT prophylaxis: UFH tid GI prophylaxis: N/A Glucose control: euglycemic with no coverage. Mobility: bedrest while on BiPAP Code Status: Full Family Communication: daughter updated at bedside. Disposition: ICU, transfer to [facility] service.    Critical care time: 45 min including chart review, examination of the patient, multidisciplinary rounding, assessment and modifications in ventilatory support.    Lynnell Catalan, MD [facility] ICU Physician [facility]  Pager: [phone] Mobile: [phone] After hours: [phone].  07/25/2018, 10:23 AM

## 2018-07-26 ENCOUNTER — Encounter (HOSPITAL_COMMUNITY): Payer: Self-pay | Admitting: Critical Care Medicine

## 2018-07-26 ENCOUNTER — Inpatient Hospital Stay (HOSPITAL_COMMUNITY): Payer: Medicare Other

## 2018-07-26 DIAGNOSIS — N179 Acute kidney failure, unspecified: Secondary | ICD-10-CM

## 2018-07-26 LAB — BASIC METABOLIC PANEL
Anion gap: 13 (ref 5–15)
Anion gap: 17 — ABNORMAL HIGH (ref 5–15)
BUN: 48 mg/dL — ABNORMAL HIGH (ref 8–23)
BUN: 53 mg/dL — AB (ref 8–23)
CO2: 36 mmol/L — ABNORMAL HIGH (ref 22–32)
CO2: 32 mmol/L (ref 22–32)
CREATININE: 2.7 mg/dL — AB (ref 0.61–1.24)
Calcium: 9.2 mg/dL (ref 8.9–10.3)
Calcium: 8.6 mg/dL — ABNORMAL LOW (ref 8.9–10.3)
Chloride: 87 mmol/L — ABNORMAL LOW (ref 98–111)
Chloride: 88 mmol/L — ABNORMAL LOW (ref 98–111)
Creatinine, Ser: 2.66 mg/dL — ABNORMAL HIGH (ref 0.61–1.24)
GFR calc Af Amer: 28 mL/min — ABNORMAL LOW (ref >60)
GFR calc Af Amer: 27 mL/min — ABNORMAL LOW (ref >60)
GFR calc non Af Amer: 24 mL/min — ABNORMAL LOW (ref >60)
GFR calc non Af Amer: 23 mL/min — ABNORMAL LOW (ref >60)
GLUCOSE: 149 mg/dL — AB (ref 70–99)
Glucose, Bld: 130 mg/dL — ABNORMAL HIGH (ref 70–99)
Potassium: 4.4 mmol/L (ref 3.5–5.1)
Potassium: 3.9 mmol/L (ref 3.5–5.1)
Sodium: 136 mmol/L (ref 135–145)
Sodium: 137 mmol/L (ref 135–145)

## 2018-07-26 LAB — POCT I-STAT 7, (LYTES, BLD GAS, ICA,H+H)
Acid-Base Excess: 8 mmol/L — ABNORMAL HIGH (ref 0.0–2.0)
Acid-Base Excess: 8 mmol/L — ABNORMAL HIGH (ref 0.0–2.0)
Acid-Base Excess: 7 mmol/L — ABNORMAL HIGH (ref 0.0–2.0)
Bicarbonate: 41 mmol/L — ABNORMAL HIGH (ref 20.0–28.0)
Bicarbonate: 38.7 mmol/L — ABNORMAL HIGH (ref 20.0–28.0)
Bicarbonate: 35.2 mmol/L — ABNORMAL HIGH (ref 20.0–28.0)
CALCIUM ION: 1.1 mmol/L — AB (ref 1.15–1.40)
Calcium, Ion: 1.19 mmol/L (ref 1.15–1.40)
Calcium, Ion: 1.08 mmol/L — ABNORMAL LOW (ref 1.15–1.40)
HCT: 54 % — ABNORMAL HIGH (ref 39.0–52.0)
HCT: 52 % (ref 39.0–52.0)
HCT: 49 % (ref 39.0–52.0)
Hemoglobin: 18.4 g/dL — ABNORMAL HIGH (ref 13.0–17.0)
Hemoglobin: 17.7 g/dL — ABNORMAL HIGH (ref 13.0–17.0)
Hemoglobin: 16.7 g/dL (ref 13.0–17.0)
O2 SAT: 93 %
O2 Saturation: 96 %
O2 Saturation: 94 %
PCO2 ART: 81.9 mmHg — AB (ref 32.0–48.0)
PH ART: 7.33 — AB (ref 7.350–7.450)
PO2 ART: 102 mmHg (ref 83.0–108.0)
Patient temperature: 98.6
Patient temperature: 98.9
Patient temperature: 100.3
Potassium: 4.4 mmol/L (ref 3.5–5.1)
Potassium: 4 mmol/L (ref 3.5–5.1)
Potassium: 4 mmol/L (ref 3.5–5.1)
Sodium: 132 mmol/L — ABNORMAL LOW (ref 135–145)
Sodium: 136 mmol/L (ref 135–145)
Sodium: 136 mmol/L (ref 135–145)
TCO2: 44 mmol/L — ABNORMAL HIGH (ref 22–32)
TCO2: 41 mmol/L — ABNORMAL HIGH (ref 22–32)
TCO2: 37 mmol/L — AB (ref 22–32)
pCO2 arterial: 94.7 mmHg (ref 32.0–48.0)
pCO2 arterial: 67.4 mmHg (ref 32.0–48.0)
pH, Arterial: 7.244 — ABNORMAL LOW (ref 7.350–7.450)
pH, Arterial: 7.283 — ABNORMAL LOW (ref 7.350–7.450)
pO2, Arterial: 83 mmHg (ref 83.0–108.0)
pO2, Arterial: 78 mmHg — ABNORMAL LOW (ref 83.0–108.0)

## 2018-07-26 LAB — BLOOD GAS, ARTERIAL
Acid-Base Excess: 10.7 mmol/L — ABNORMAL HIGH (ref 0.0–2.0)
Bicarbonate: 38.5 mmol/L — ABNORMAL HIGH (ref 20.0–28.0)
Delivery systems: POSITIVE
Drawn by: 244901
Expiratory PAP: 4
FIO2: 60
INSPIRATORY PAP: 16
LHR: 18 {breaths}/min
MODE: POSITIVE
O2 Saturation: 97.1 %
Patient temperature: 98.4
pCO2 arterial: 97.7 mmHg (ref 32.0–48.0)
pH, Arterial: 7.219 — ABNORMAL LOW (ref 7.350–7.450)
pO2, Arterial: 104 mmHg (ref 83.0–108.0)

## 2018-07-26 LAB — CBC WITH DIFFERENTIAL/PLATELET
ABS IMMATURE GRANULOCYTES: 0.06 10*3/uL (ref 0.00–0.07)
Basophils Absolute: 0 10*3/uL (ref 0.0–0.1)
Basophils Relative: 0 %
Eosinophils Absolute: 0 10*3/uL (ref 0.0–0.5)
Eosinophils Relative: 0 %
HCT: 51.7 % (ref 39.0–52.0)
Hemoglobin: 14.8 g/dL (ref 13.0–17.0)
IMMATURE GRANULOCYTES: 0 %
LYMPHS PCT: 3 %
Lymphs Abs: 0.4 10*3/uL — ABNORMAL LOW (ref 0.7–4.0)
MCH: 26.1 pg (ref 26.0–34.0)
MCHC: 28.6 g/dL — ABNORMAL LOW (ref 30.0–36.0)
MCV: 91 fL (ref 80.0–100.0)
Monocytes Absolute: 1.7 10*3/uL — ABNORMAL HIGH (ref 0.1–1.0)
Monocytes Relative: 12 %
NEUTROS ABS: 12.9 10*3/uL — AB (ref 1.7–7.7)
NEUTROS PCT: 85 %
Platelets: 288 10*3/uL (ref 150–400)
RBC: 5.68 MIL/uL (ref 4.22–5.81)
RDW: 18.8 % — ABNORMAL HIGH (ref 11.5–15.5)
WBC: 15.2 10*3/uL — ABNORMAL HIGH (ref 4.0–10.5)
nRBC: 0 % (ref 0.0–0.2)

## 2018-07-26 LAB — GLUCOSE, CAPILLARY
GLUCOSE-CAPILLARY: 86 mg/dL (ref 70–99)
Glucose-Capillary: 107 mg/dL — ABNORMAL HIGH (ref 70–99)
Glucose-Capillary: 138 mg/dL — ABNORMAL HIGH (ref 70–99)
Glucose-Capillary: 162 mg/dL — ABNORMAL HIGH (ref 70–99)
Glucose-Capillary: 185 mg/dL — ABNORMAL HIGH (ref 70–99)

## 2018-07-26 LAB — LACTIC ACID, PLASMA
LACTIC ACID, VENOUS: 1.7 mmol/L (ref 0.5–1.9)
Lactic Acid, Venous: 1.1 mmol/L (ref 0.5–1.9)

## 2018-07-26 LAB — PROCALCITONIN: Procalcitonin: 0.12 ng/mL

## 2018-07-26 LAB — MAGNESIUM: MAGNESIUM: 2.8 mg/dL — AB (ref 1.7–2.4)

## 2018-07-26 LAB — AMMONIA: Ammonia: 20 umol/L (ref 9–35)

## 2018-07-26 LAB — TRIGLYCERIDES: Triglycerides: 200 mg/dL — ABNORMAL HIGH (ref <150)

## 2018-07-26 MED ORDER — FENTANYL BOLUS VIA INFUSION
25.0000 ug | INTRAVENOUS | Status: DC | PRN
  Administered 2018-07-27 – 2018-07-28 (×4): 25 ug via INTRAVENOUS
  Filled 2018-07-26: qty 25

## 2018-07-26 MED ORDER — SODIUM CHLORIDE 0.9 % IV SOLN
500.0000 mg | INTRAVENOUS | Status: DC
  Administered 2018-07-26 – 2018-07-27 (×2): 500 mg via INTRAVENOUS
  Filled 2018-07-26 (×3): qty 500

## 2018-07-26 MED ORDER — EYE WASH OPHTH SOLN
2.0000 [drp] | Freq: Four times a day (QID) | OPHTHALMIC | Status: DC
  Administered 2018-07-26 – 2018-07-30 (×17): 2 [drp] via OPHTHALMIC
  Filled 2018-07-26: qty 118

## 2018-07-26 MED ORDER — SUCCINYLCHOLINE CHLORIDE 20 MG/ML IJ SOLN
1.5000 mg/kg | Freq: Once | INTRAMUSCULAR | Status: AC
  Administered 2018-07-26: 122 mg via INTRAVENOUS
  Filled 2018-07-26: qty 6.1

## 2018-07-26 MED ORDER — CHLORHEXIDINE GLUCONATE CLOTH 2 % EX PADS: 6.0000 | MEDICATED_PAD | Freq: Every day | CUTANEOUS | Status: DC

## 2018-07-26 MED ORDER — PROPOFOL 1000 MG/100ML IV EMUL
INTRAVENOUS | Status: AC
  Administered 2018-07-26: 17:00:00
  Filled 2018-07-26: qty 100

## 2018-07-26 MED ORDER — DOCUSATE SODIUM 50 MG/5ML PO LIQD
100.0000 mg | Freq: Two times a day (BID) | ORAL | Status: DC | PRN
  Administered 2018-07-26: 100 mg
  Filled 2018-07-26: qty 10

## 2018-07-26 MED ORDER — DEXMEDETOMIDINE HCL IN NACL 200 MCG/50ML IV SOLN
0.4000 ug/kg/h | INTRAVENOUS | Status: DC
  Administered 2018-07-26 (×2): 0.4 ug/kg/h via INTRAVENOUS
  Filled 2018-07-26 (×2): qty 50

## 2018-07-26 MED ORDER — PROPOFOL 1000 MG/100ML IV EMUL
5.0000 ug/kg/min | INTRAVENOUS | Status: DC
  Administered 2018-07-26: 30 ug/kg/min via INTRAVENOUS

## 2018-07-26 MED ORDER — PROPOFOL 1000 MG/100ML IV EMUL
INTRAVENOUS | Status: AC
  Filled 2018-07-26: qty 100

## 2018-07-26 MED ORDER — FENTANYL CITRATE (PF) 100 MCG/2ML IJ SOLN
100.0000 ug | INTRAMUSCULAR | Status: AC
  Administered 2018-07-26: 100 ug via INTRAVENOUS

## 2018-07-26 MED ORDER — FENTANYL 2500MCG IN NS 250ML (10MCG/ML) PREMIX INFUSION
25.0000 ug/h | INTRAVENOUS | Status: DC
  Administered 2018-07-26: 25 ug/h via INTRAVENOUS
  Administered 2018-07-27: 274 ug/h via INTRAVENOUS
  Administered 2018-07-27 (×2): 300 ug/h via INTRAVENOUS
  Administered 2018-07-28: 400 ug/h via INTRAVENOUS
  Administered 2018-07-28: 150 ug/h via INTRAVENOUS
  Administered 2018-07-29: 200 ug/h via INTRAVENOUS
  Filled 2018-07-26 (×7): qty 250

## 2018-07-26 MED ORDER — SODIUM CHLORIDE 0.9% FLUSH: 10.0000 mL | INTRAVENOUS | Status: DC | PRN

## 2018-07-26 MED ORDER — CHLORHEXIDINE GLUCONATE 0.12% ORAL RINSE (MEDLINE KIT)
15.0000 mL | Freq: Two times a day (BID) | OROMUCOSAL | Status: DC
  Administered 2018-07-26 – 2018-08-08 (×17): 15 mL via OROMUCOSAL

## 2018-07-26 MED ORDER — FENTANYL CITRATE (PF) 100 MCG/2ML IJ SOLN
50.0000 ug | INTRAMUSCULAR | Status: DC | PRN
  Administered 2018-07-26 (×2): 50 ug via INTRAVENOUS
  Filled 2018-07-26 (×2): qty 2

## 2018-07-26 MED ORDER — ORAL CARE MOUTH RINSE
15.0000 mL | OROMUCOSAL | Status: DC
  Administered 2018-07-26 – 2018-07-30 (×40): 15 mL via OROMUCOSAL

## 2018-07-26 MED ORDER — DEXMEDETOMIDINE HCL IN NACL 200 MCG/50ML IV SOLN
0.4000 ug/kg/h | INTRAVENOUS | Status: DC
  Administered 2018-07-26: 0.4 ug/kg/h via INTRAVENOUS
  Filled 2018-07-26: qty 50

## 2018-07-26 MED ORDER — METHYLPREDNISOLONE SODIUM SUCC 125 MG IJ SOLR
125.0000 mg | INTRAMUSCULAR | Status: AC
  Administered 2018-07-26: 125 mg via INTRAVENOUS
  Filled 2018-07-26: qty 2

## 2018-07-26 MED ORDER — SODIUM BICARBONATE 8.4 % IV SOLN
INTRAVENOUS | Status: AC
  Administered 2018-07-26: 12:00:00
  Filled 2018-07-26: qty 100

## 2018-07-26 MED ORDER — DEXMEDETOMIDINE HCL IN NACL 200 MCG/50ML IV SOLN: 0.0000 ug/kg/h | INTRAVENOUS | Status: DC

## 2018-07-26 MED ORDER — SODIUM CHLORIDE 0.9 % IV SOLN
100.0000 mg | Freq: Two times a day (BID) | INTRAVENOUS | Status: DC
  Administered 2018-07-26 – 2018-08-02 (×14): 100 mg via INTRAVENOUS
  Filled 2018-07-26 (×15): qty 100

## 2018-07-26 MED ORDER — MIDAZOLAM HCL 2 MG/2ML IJ SOLN
INTRAMUSCULAR | Status: AC
  Filled 2018-07-26: qty 4

## 2018-07-26 MED ORDER — SODIUM CHLORIDE 0.9% FLUSH
10.0000 mL | Freq: Two times a day (BID) | INTRAVENOUS | Status: DC
  Administered 2018-07-26: 10 mL
  Administered 2018-07-26: 30 mL
  Administered 2018-07-27: 20 mL
  Administered 2018-07-27: 10 mL
  Administered 2018-07-28: 40 mL

## 2018-07-26 MED ORDER — ALBUMIN HUMAN 25 % IV SOLN
50.0000 g | Freq: Once | INTRAVENOUS | Status: AC
  Administered 2018-07-26: 50 g via INTRAVENOUS
  Filled 2018-07-26: qty 50

## 2018-07-26 MED ORDER — FENTANYL CITRATE (PF) 100 MCG/2ML IJ SOLN: 50.0000 ug | Freq: Once | INTRAMUSCULAR | Status: AC

## 2018-07-26 MED ORDER — ALBUTEROL SULFATE (2.5 MG/3ML) 0.083% IN NEBU
2.5000 mg | INHALATION_SOLUTION | RESPIRATORY_TRACT | Status: DC
  Administered 2018-07-26 – 2018-07-28 (×12): 2.5 mg via RESPIRATORY_TRACT
  Filled 2018-07-26 (×12): qty 3

## 2018-07-26 MED ORDER — PROPOFOL 1000 MG/100ML IV EMUL
5.0000 ug/kg/min | INTRAVENOUS | Status: DC
  Administered 2018-07-26: 5 ug/kg/min via INTRAVENOUS

## 2018-07-26 MED ORDER — INSULIN ASPART 100 UNIT/ML ~~LOC~~ SOLN
0.0000 [IU] | SUBCUTANEOUS | Status: DC
  Administered 2018-07-26: 3 [IU] via SUBCUTANEOUS
  Administered 2018-07-27 (×2): 2 [IU] via SUBCUTANEOUS
  Administered 2018-07-27: 3 [IU] via SUBCUTANEOUS
  Administered 2018-07-27: 2 [IU] via SUBCUTANEOUS
  Administered 2018-07-27 – 2018-07-29 (×8): 3 [IU] via SUBCUTANEOUS
  Administered 2018-07-29 (×3): 2 [IU] via SUBCUTANEOUS
  Administered 2018-07-29: 3 [IU] via SUBCUTANEOUS
  Administered 2018-07-30 – 2018-07-31 (×6): 2 [IU] via SUBCUTANEOUS
  Administered 2018-07-31: 3 [IU] via SUBCUTANEOUS
  Administered 2018-08-01 – 2018-08-02 (×2): 2 [IU] via SUBCUTANEOUS

## 2018-07-26 MED ORDER — SODIUM BICARBONATE 8.4 % IV SOLN
100.0000 meq | INTRAVENOUS | Status: AC
  Administered 2018-07-26: 100 meq via INTRAVENOUS

## 2018-07-26 MED ORDER — SODIUM CHLORIDE 0.9 % IV SOLN: INTRAVENOUS | Status: DC | PRN

## 2018-07-26 MED ORDER — FENTANYL CITRATE (PF) 100 MCG/2ML IJ SOLN
INTRAMUSCULAR | Status: AC
  Administered 2018-07-26: 100 ug via INTRAVENOUS
  Filled 2018-07-26: qty 2

## 2018-07-26 MED ORDER — DEXMEDETOMIDINE HCL IN NACL 400 MCG/100ML IV SOLN
0.4000 ug/kg/h | INTRAVENOUS | Status: DC
  Administered 2018-07-26 – 2018-07-27 (×6): 1.2 ug/kg/h via INTRAVENOUS
  Administered 2018-07-27 (×2): 1.1 ug/kg/h via INTRAVENOUS
  Administered 2018-07-27: 1.2 ug/kg/h via INTRAVENOUS
  Administered 2018-07-28: 0.6 ug/kg/h via INTRAVENOUS
  Administered 2018-07-28: 0.8 ug/kg/h via INTRAVENOUS
  Administered 2018-07-28 (×2): 1.2 ug/kg/h via INTRAVENOUS
  Administered 2018-07-28: 0.6 ug/kg/h via INTRAVENOUS
  Administered 2018-07-29 (×2): 0.7 ug/kg/h via INTRAVENOUS
  Administered 2018-07-29 (×2): 0.8 ug/kg/h via INTRAVENOUS
  Administered 2018-07-30: 0.7 ug/kg/h via INTRAVENOUS
  Administered 2018-07-30: 0.8 ug/kg/h via INTRAVENOUS
  Filled 2018-07-26 (×21): qty 100

## 2018-07-26 MED ORDER — NOREPINEPHRINE 4 MG/250ML-% IV SOLN
INTRAVENOUS | Status: AC
  Filled 2018-07-26: qty 250

## 2018-07-26 MED ORDER — METHYLPREDNISOLONE SODIUM SUCC 40 MG IJ SOLR
40.0000 mg | Freq: Two times a day (BID) | INTRAMUSCULAR | Status: DC
  Administered 2018-07-26 – 2018-07-27 (×3): 40 mg via INTRAVENOUS
  Filled 2018-07-26 (×3): qty 1

## 2018-07-26 MED ORDER — CHLORHEXIDINE GLUCONATE CLOTH 2 % EX PADS
6.0000 | MEDICATED_PAD | Freq: Every day | CUTANEOUS | Status: DC
  Administered 2018-07-27 – 2018-07-30 (×4): 6 via TOPICAL

## 2018-07-26 MED ORDER — LACTATED RINGERS IV SOLN
INTRAVENOUS | Status: DC
  Administered 2018-07-26: 15:00:00 via INTRAVENOUS

## 2018-07-26 MED ORDER — FAMOTIDINE 40 MG/5ML PO SUSR
10.0000 mg | Freq: Two times a day (BID) | ORAL | Status: DC
  Administered 2018-07-26 – 2018-07-27 (×2): 10.4 mg via ORAL
  Filled 2018-07-26 (×2): qty 2.5

## 2018-07-26 MED ORDER — ETOMIDATE 2 MG/ML IV SOLN
0.3000 mg/kg | Freq: Once | INTRAVENOUS | Status: AC
  Administered 2018-07-26: 32.28 mg via INTRAVENOUS

## 2018-07-26 MED ORDER — ASPIRIN 81 MG PO CHEW
81.0000 mg | CHEWABLE_TABLET | Freq: Every day | ORAL | Status: DC
  Administered 2018-07-27 – 2018-08-08 (×12): 81 mg via ORAL
  Filled 2018-07-26 (×12): qty 1

## 2018-07-26 MED ORDER — MIDAZOLAM HCL 2 MG/2ML IJ SOLN
2.0000 mg | INTRAMUSCULAR | Status: AC
  Administered 2018-07-26: 2 mg via INTRAVENOUS

## 2018-07-26 MED ORDER — VASOPRESSIN 20 UNIT/ML IV SOLN
0.0300 [IU]/min | INTRAVENOUS | Status: DC
  Administered 2018-07-26: 0.03 [IU]/min via INTRAVENOUS
  Filled 2018-07-26 (×2): qty 2

## 2018-07-26 MED ORDER — NOREPINEPHRINE 4 MG/250ML-% IV SOLN
0.0000 ug/min | INTRAVENOUS | Status: DC
  Administered 2018-07-27: 4 ug/min via INTRAVENOUS
  Filled 2018-07-26: qty 250

## 2018-07-26 MED ORDER — FENTANYL CITRATE (PF) 100 MCG/2ML IJ SOLN
50.0000 ug | INTRAMUSCULAR | Status: DC | PRN
  Administered 2018-07-26 (×2): 50 ug via INTRAVENOUS
  Filled 2018-07-26: qty 2

## 2018-07-26 NOTE — Progress Notes (Signed)
Patient ID: Kristopher [name], male   DOB: 12-Nov-1950, 68 y.o.   MRN: [id number]     Advanced Heart Failure Rounding Note  PCP-Cardiologist: Armanda Magic, MD   Subjective:    Patient lethargic this morning, grunts but does not open eyes to stimulation.  ABG 7.24/95/102 on Bipap.   Creatinine up to 2.66. SBP 80s-90s.   Echo 07/22/18: EF 25-30%, global HK, RV normal function, increased wall thickness, LA moderately dilated, RA severely dilated  Objective:   Weight Range: 107.6 kg Body mass index is 38.29 kg/m.   Vital Signs:   Temp:  [97.8 F (36.6 C)-99.3 F (37.4 C)] 98.4 F (36.9 C) (02/15 0801) Pulse Rate:  [70-99] 78 (02/15 0900) Resp:  [18-40] 29 (02/15 0900) BP: (70-140)/(46-84) 84/51 (02/15 0900) SpO2:  [87 %-98 %] 97 % (02/15 0900) FiO2 (%):  [50 %-60 %] 60 % (02/15 0420) Weight:  [107.6 kg] 107.6 kg (02/15 0500) Last BM Date: 07/23/18  Weight change: Filed Weights   07/24/18 0213 07/25/18 0830 07/26/18 0500  Weight: 109.8 kg 107.6 kg 107.6 kg    Intake/Output:   Intake/Output Summary (Last 24 hours) at 07/26/2018 0926 Last data filed at 07/26/2018 0758 Gross per 24 hour  Intake 826.6 ml  Output 915 ml  Net -88.4 ml      Physical Exam    General: Lethargic, grunts to stimulation. On Bipap.  Neck: No JVD, no thyromegaly or thyroid nodule.  Lungs: Distant BS. CV: Nondisplaced PMI.  Heart regular S1/S2, no S3/S4, no murmur.  No peripheral edema.   Abdomen: Soft, nontender, no hepatosplenomegaly, no distention.  Skin: Intact without lesions or rashes.  Neurologic: Alert and oriented x 3.  Psych: Normal affect. Extremities: No clubbing or cyanosis.  HEENT: Normal.    Telemetry   NSR 90s. Personally reviewed.   EKG    No new tracings.   Labs    CBC Recent Labs    07/24/18 0535 07/26/18 0337 07/26/18 0830  WBC 12.6* 15.2*  --   NEUTROABS  --  12.9*  --   HGB 14.7 14.8 18.4*  HCT 51.7 51.7 54.0*  MCV 90.1 91.0  --   PLT 357 288  --     Basic Metabolic Panel Recent Labs    56/38/75 0535 07/25/18 0357 07/26/18 0337 07/26/18 0830  NA 139 138 136 132*  K 4.0 4.3 4.4 4.4  CL 93* 90* 87*  --   CO2 35* 36* 36*  --   GLUCOSE 108* 149* 130*  --   BUN 24* 32* 48*  --   CREATININE 1.43* 1.58* 2.66*  --   CALCIUM 9.5 9.6 9.2  --   MG 2.4  --  2.8*  --    Liver Function Tests No results for input(s): AST, ALT, ALKPHOS, BILITOT, PROT, ALBUMIN in the last 72 hours. No results for input(s): LIPASE, AMYLASE in the last 72 hours. Cardiac Enzymes No results for input(s): CKTOTAL, CKMB, CKMBINDEX, TROPONINI in the last 72 hours.  BNP: BNP (last 3 results) Recent Labs    07/21/18 1123  BNP 964.4*    ProBNP (last 3 results) No results for input(s): PROBNP in the last 8760 hours.   D-Dimer No results for input(s): DDIMER in the last 72 hours. Hemoglobin A1C No results for input(s): HGBA1C in the last 72 hours. Fasting Lipid Panel No results for input(s): CHOL, HDL, LDLCALC, TRIG, CHOLHDL, LDLDIRECT in the last 72 hours. Thyroid Function Tests No results for input(s): TSH, T4TOTAL, T3FREE,  THYROIDAB in the last 72 hours.  Invalid input(s): FREET3  Other results:   Imaging    No results found.   Medications:     Scheduled Medications: . aspirin EC  81 mg Oral Daily  . chlorhexidine  15 mL Mouth Rinse BID  . heparin  5,000 Units Subcutaneous Q8H  . mouth rinse  15 mL Mouth Rinse q12n4p  . sodium chloride flush  3 mL Intravenous Q12H    Infusions: . sodium chloride    . albumin human      PRN Medications: sodium chloride, acetaminophen, guaiFENesin-dextromethorphan, levalbuterol, ondansetron (ZOFRAN) IV, sodium chloride flush    Patient Profile   Kristopher [name] is a 68 year old with history of obesity, HTN, DM, asthma and chronic diastolic/systolic heart failure due to NICM with EF 45%.   Assessment/Plan  1. Acute on chronic systolic/diastolic HF - Echo 01/07/17 EF 23%. Cath with minimal CAD -  Echo 07/22/18: EF 25-30%, global HK, RV normal function, increased wall thickness, LA moderately dilated, RA severely dilated - He does not appear volume overloaded now, creatinine up to 2.66.  Lasix stopped.   - Stop Entresto and spironolactone.  - Suspect hypovolemia at this point, getting albumin this morning per CCM.   2. Acute hypoxic respiratory failure - Likely combination of HF and AECOPD - On Bipap with severe hypercarbia and acidosis today, lethargic.  He looks like he will need intubation.  CCM has seen, adjusted Bipap, will recheck ABG soon and if still looks bad will need intubation.   3. Undiagnosed OSA - Will need outpatient sleep study.Will likely need to set him up with BiPAP prior to DC.   4. DM2 - Per primary team - Consider SGLT2i at d/c. No change.   5. AKI - Creatinine up to 2.66, diuretics and Entresto/spironolactone stopped.     CRITICAL CARE Performed by: Marca Ancona  Total critical care time: 35 minutes  Critical care time was exclusive of separately billable procedures and treating other patients.  Critical care was necessary to treat or prevent imminent or life-threatening deterioration.  Critical care was time spent personally by me (independent of midlevel providers or residents) on the following activities: development of treatment plan with patient and/or surrogate as well as nursing, discussions with consultants, evaluation of patient's response to treatment, examination of patient, obtaining history from patient or surrogate, ordering and performing treatments and interventions, ordering and review of laboratory studies, ordering and review of radiographic studies, pulse oximetry and re-evaluation of patient's condition.  Marca Ancona, MD  9:26 AM

## 2018-07-26 NOTE — Progress Notes (Signed)
31mL UOP in last 2 hours via foley. Elink called & Dr. Darrick Penna notified. No additional orders at this time. Assessment ongoing.

## 2018-07-26 NOTE — Procedures (Signed)
Arterial Catheter Insertion Procedure Note Kristopher [name] [id number] 1951-02-23  Procedure: Insertion of Arterial Catheter  Indications: Blood pressure monitoring and Frequent blood sampling  Procedure Details Consent: Unable to obtain consent because of patient sedated on the ventilator. Time Out: Verified patient identification, verified procedure, site/side was marked, verified correct patient position, special equipment/implants available, medications/allergies/relevent history reviewed, required imaging and test results available.  Performed  Maximum sterile technique was used including antiseptics, cap, gloves, gown, hand hygiene, mask and sheet. Skin prep: Chlorhexidine; local anesthetic administered 20 gauge catheter was inserted into right radial artery using the Seldinger technique. ULTRASOUND GUIDANCE USED: NO Evaluation Blood flow good; BP tracing good. Complications: No apparent complications.   Jacqulynn Cadet 07/26/2018

## 2018-07-26 NOTE — Progress Notes (Signed)
Critical ABG results given to Renard Matter MD at [phone].  Jacqulynn Cadet RRT

## 2018-07-26 NOTE — Progress Notes (Signed)
Pt continues to remove bipap mask. Pt alert to self, disoriented to time/place/situation. Pt stating staff is "lying to him" & refuses bipap. Two RNs at bedside, attempting to place nasal cannula on pt, pt refused. Pt desaturated 79-80% on room air. Multiple attempts made to place pt back on bipap; pt refuses. Pt agreed to nasal cannula 6L.CCM Dr. [place] Kloefkorn updated on pt status. Order received for precedex gtt.

## 2018-07-26 NOTE — Plan of Care (Signed)
  Problem: Clinical Measurements: Goal: Respiratory complications will improve Outcome: Progressing Goal: Cardiovascular complication will be avoided Outcome: Progressing   Problem: Elimination: Goal: Will not experience complications related to bowel motility Outcome: Progressing Goal: Will not experience complications related to urinary retention Outcome: Progressing   Problem: Safety: Goal: Ability to remain free from injury will improve Outcome: Progressing   Problem: Skin Integrity: Goal: Risk for impaired skin integrity will decrease Outcome: Progressing   Problem: Cardiovascular: Goal: Ability to achieve and maintain adequate cardiovascular perfusion will improve Outcome: Progressing   Problem: Activity: Goal: Ability to tolerate increased activity will improve Outcome: Progressing   Problem: Respiratory: Goal: Ability to maintain a clear airway and adequate ventilation will improve Outcome: Progressing

## 2018-07-26 NOTE — Progress Notes (Signed)
Pt pulled off bipap mask. RN at bedside. Pt alert to self, disoriented to place/time, increased respiratory effort. Attempted to place bipap mask back on pt, pt initially refused. RN discussed benefit of bipap mask & pt agreed to put mask back on. Assessment ongoing.

## 2018-07-26 NOTE — Procedures (Signed)
Intubation Procedure Note Kristopher [name] [id number] 01-14-1951  Procedure: Intubation Indications: Airway protection and maintenance, self-extubated  Procedure Details Consent: Unable to obtain consent because of emergent medical necessity. Time Out: Verified patient identification, verified procedure, site/side was marked, verified correct patient position, special equipment/implants available, medications/allergies/relevent history reviewed, required imaging and test results available.  Performed  Maximum sterile technique was used including gloves and hand hygiene.  4 glidescope    Evaluation Hemodynamic Status: BP stable throughout; O2 sats: stable throughout Patient's Current Condition: stable Complications: No apparent complications Patient did tolerate procedure well. Chest X-ray ordered to verify placement.  CXR: pending.   Abbe Amsterdam 07/26/2018

## 2018-07-26 NOTE — Progress Notes (Signed)
Pt pulled bipap mask off again. O2 sat low 90s. Pt refusing to have mask back on. Discussed need for bipap with pt, pt agreed to bipap mask. O2 sat mid 90s. Assessment ongoing.

## 2018-07-26 NOTE — Procedures (Signed)
Intubation Procedure Note BOWAN FOWLE [id number] Dec 26, 1950  Procedure: Intubation Indications: Airway protection and maintenance, Acute respiratory failure requiring mechanical ventilation  Procedure Details Consent: Risks of procedure as well as the alternatives and risks of each were explained to the (patient/caregiver).  Consent for procedure obtained. Time Out: Verified patient identification, verified procedure, site/side was marked, verified correct patient position, special equipment/implants available, medications/allergies/relevent history reviewed, required imaging and test results available.  Performed  Maximum sterile technique was used including hand hygiene.  4 Glidescope    Evaluation Hemodynamic Status: Persistent hypotension treated with pressors and IV fluids; O2 sats: transiently fell during during procedure and currently acceptable  Intubated on vasopressin for hypotension and pulmonary hypertension Patient's Current Condition: stable Complications: No apparent complications Patient did tolerate procedure well. Chest X-ray ordered to verify placement.  CXR: tube position acceptable. Patient had grade 2 view with glidescope. Very dry mucus membranes Easy BMV. Hypoxia was transient and improved with PEEP and recruitment maneuvers.  Developed hypotension a few minutes after intubation requiring cessation of propofol. Improved.   Abbe Amsterdam 07/26/2018

## 2018-07-26 NOTE — Procedures (Signed)
Central Venous Catheter Insertion Procedure Note DECARLO MATSEN [id number] [birth year]  Procedure: Insertion of Central Venous Catheter Indications: Assessment of intravascular volume, Drug and/or fluid administration, Frequent blood sampling and Hypotension  Procedure Details Consent: Risks of procedure as well as the alternatives and risks of each were explained to the (patient/caregiver).  Consent for procedure obtained. Time Out: Verified patient identification, verified procedure, site/side was marked, verified correct patient position, special equipment/implants available, medications/allergies/relevent history reviewed, required imaging and test results available.  Performed  Maximum sterile technique was used including antiseptics, cap, gloves, gown, hand hygiene, mask and sheet. Skin prep: Chlorhexidine; local anesthetic administered A antimicrobial bonded/coated triple lumen catheter was placed in the left internal jugular vein using the Seldinger technique.  Evaluation Blood flow good Complications: No apparent complications Patient did tolerate procedure well. Chest X-ray ordered to verify placement.  CXR: normal. Line tip at SVC/RA junction Done under US guidance. Wire seen in Left IJ  Abbe Amsterdam 07/26/2018, 2:40 PM

## 2018-07-26 NOTE — Plan of Care (Signed)
D/t lethargy and then intubation, patient hasn't been able to be educated today.

## 2018-07-26 NOTE — Progress Notes (Signed)
Precedex gtt infusing. Able to place pt on bipap & pt resting comfortably. CCM Dr. [place] Kloefkorn at bedside; order to obtain ABG if pt less responsive. Assessment ongoing.

## 2018-07-26 NOTE — Progress Notes (Signed)
<  58mL UOP in last 2 hours. Bladder scanned: 62mL. Foley irrigated without complication. Creatinine level increasing. Elink notified; awaiting any further orders. Assessment ongoing.

## 2018-07-26 NOTE — Progress Notes (Signed)
-  NAME:  Kristopher [name], MRN:  [id number], DOB:  05/11/1951, LOS: 5 ADMISSION DATE:  07/21/2018, CONSULTATION DATE:   REFERRING MD:  CHIEF COMPLAINT:  Respiratory Failure  Brief History   68 year old man presented 5days ago to ED with progressive SOB and edema. Had approximately 15kg weight gain. Known mixed HFREF and diastolic heart function with COPD/Tobacco use Admitted to floor initially, then transferred 2.14.2020 to stepdown secondary to hypercapnia and encephalopathy. Started on BiPAP, continued on diuretics including diamox.Steroids discontinued. Felt to be heart failure related  History of present illness   68 year old male with chronic systolic and diastolic CHF, new EF of 73% a, history of questionable diabetes, tobacco use, asthma/COPD presented to the ED with progressive dyspnea over 2 weeks associated with lower extremity edema orthopnea PND and 30 pound weight gain. -Patient reported compliance with medications -In addition reports a recent URI cough and wheezing and this was confirmed by family  Past Medical History   Past Medical History:  Diagnosis Date  . Acute combined systolic and diastolic heart failure ([place]) 08/11/2016  . Acute esophagitis 08/17/2016  . Acute upper GI bleed 08/10/2016  . Asthma   . CAD (coronary artery disease) 08/16/2016  . CHF (congestive heart failure) ([place])   . DCM (dilated cardiomyopathy) ([place])   . Dyspnea   . HTN (hypertension)   . LBBB (left bundle branch block) 08/10/2016  . Lymphadenopathy 08/17/2016  . PUD (peptic ulcer disease) 08/17/2016  . Pulmonary HTN ([place]) 08/17/2016   Past Surgical History:  Procedure Laterality Date  . ESOPHAGOGASTRODUODENOSCOPY (EGD) WITH PROPOFOL Left 08/11/2016   Procedure: ESOPHAGOGASTRODUODENOSCOPY (EGD) WITH PROPOFOL;  Surgeon: Arta Silence, MD;  Location: [facility] ENDOSCOPY;  Service: Endoscopy;  Laterality: Left;  . RIGHT/LEFT HEART CATH AND CORONARY ANGIOGRAPHY N/A 08/15/2016   Procedure: Right/Left Heart Cath and  Coronary Angiography;  Surgeon: Jettie Booze, MD;  Location: [facility];  Service: Cardiovascular;  Laterality: N/A;   Significant Hospital Events   2.15.2020: Worsening respiratory status despite aggressive medical treatment for presumed heart failure. Dry mucus membranes on exam with [place] evidence of hypovolemia. Required intubation. Excessive thick secretions. Central line placed. Transient hypotension requiring volume replacement and vasopressors. Acute kidney dysfunction. Started on CAP treatment.  2.14.2020:Admitted to ICU with worsening respiratory failure despite treatment for heart failure.   Consults:  Treatment Team:  Lbcardiology, Rounding, MD  Critical Care  Procedures:  Central line: Left IJ 2.15.2020 Intubation: #8, 23 at lip; 2.15.2020  Significant Diagnostic Tests:  Echocardiogram: 2.11.2020: 1. The left ventricle has severely reduced systolic function of 22-02%. The cavity size was normal. Left ventricular diastolic Doppler parameters are consistent with impaired relaxation Left ventrical global hypokinesis without obvious regional wall  motion abnormalities. The patient refused Definity contrast, the evaluation of wall motion is suboptimal.  2. The right ventricle has normal systolic function. The cavity was moderately enlarged. There is no increase in right ventricular wall thickness. Right ventricular systolic pressure normal with an estimated pressure of 28.6 mmHg.  3. Left atrial size was moderately dilated.  4. Right atrial size was severely dilated.  5. The mitral valve is normal in structure.  6. The tricuspid valve is normal in structure.  7. The aortic valve is normal in structure.  FINDINGS  Left Ventricle: The left ventricle has moderate-severely reduced systolic function of 54-27%. The cavity size was normal. There is moderate concentric left ventricular hypertrophy. Left ventricular diastolic Doppler parameters are consistent with  impaired  relaxation (grade  I) Normal left ventricular filling pressures Left ventrical global hypokinesis without regional wall motion abnormalities. Right Ventricle: The right ventricle has normal systolic function. The cavity was moderately enlarged. There is no increase in right ventricular wall thickness. Right ventricular systolic pressure normal with an estimated pressure of 28.6 mmHg. Left Atrium: left atrial size was moderately dilated Right Atrium: right atrial size was severely dilated Interatrial Septum: No atrial level shunt detected by color flow Doppler. Pericardium: There is no evidence of pericardial effusion. Mitral Valve: The mitral valve is normal in structure. Mitral valve regurgitation is not visualized by color flow Doppler. Tricuspid Valve: The tricuspid valve is normal in structure. Tricuspid valve regurgitation is mild by color flow Doppler. Aortic Valve: The aortic valve is normal in structure. Aortic valve regurgitation was not visualized by color flow Doppler. Pulmonic Valve: The pulmonic valve was not well visualized. The pulmonic valve was grossly normal. Pulmonic valve regurgitation is not visualized by color flow Doppler. Venous: The inferior vena cava is normal in size with greater than 50% respiratory variability.   LEFT VENTRICLE PLAX 2D (Teich) LV EF:          45.2 %   Diastology LVIDd:          5.30 cm  LV e' lateral:   5.44 cm/s LVIDs:          4.10 cm  LV E/e' lateral: 9.4 LV PW:          1.50 cm LV IVS:         1.70 cm LVOT diam:      2.00 cm LV SV:          61 ml LVOT Area:      3.14 cm  RIGHT VENTRICLE RV Basal diam:  4.76 cm RV S prime:     10.20 cm/s TAPSE (M-mode): 1.4 cm RVSP:           28.6 mmHg  LEFT ATRIUM             Index       RIGHT ATRIUM           Index LA diam:        4.70 cm 2.13 cm/m  RA Pressure: 8 mmHg LA Vol (A2C):   58.5 ml 26.53 ml/m RA Area:     32.30 cm LA Vol (A4C):   82.0 ml 37.19 ml/m RA Volume:   121.00 ml 54.87  ml/m LA Biplane Vol: 72.1 ml 32.70 ml/m  AORTIC VALVE LVOT Vmax:   82.53 cm/s LVOT Vmean:  54.467 cm/s LVOT VTI:    0.127 m   AORTA Ao Root diam: 3.10 cm  MV E velocity: 50.93 cm/s TR Peak grad: 20.6 mmHg MV A velocity: 66.94 cm/s TR Vmax:      244.00 cm/s MV E/A ratio:  0.76       RVSP:         28.6 mmHg   Micro Data:  Gram stain and culture 2.15.2020: Recent Results (from the past 240 hour(s))  MRSA PCR Screening     Status: None   Collection Time: 07/25/18 10:25 AM  Result Value Ref Range Status   MRSA by PCR NEGATIVE NEGATIVE Final    Comment:        The GeneXpert MRSA Assay (FDA approved for NASAL specimens only), is one component of a comprehensive MRSA colonization surveillance program. It is not intended to diagnose MRSA infection nor to guide or monitor treatment for MRSA infections. Performed  at [facility], [place] [address] [place], [place] [address]     Antimicrobials:  Doxycycline Azithromycin to cover atypicals resistant to doxy  Interim history/subjective:  2.15.2020: Patient became more encephalopathic. Acidosis worsened presumably from diamox and respiratory failure. Difficult to arouse. Family endorses URI and "bronchitis" prior to admission Exam under ultrasound showed significant respirophasic changes of IVC during hypotension. LV dilated and confirmed low EF. RV underfilled as well. Central line and intubation done   Objective   Blood pressure (!) 85/70, pulse 86, temperature 98.9 F (37.2 C), temperature source Oral, resp. rate (!) 24, height '5\' 6"'  (1.676 m), weight 107.6 kg, SpO2 98 %.    Vent Mode: PRVC FiO2 (%):  [50 %-60 %] 60 % Set Rate:  [16 bmp] 16 bmp Vt Set:  [510 mL] 510 mL PEEP:  [10 cmH20] 10 cmH20 Plateau Pressure:  [27 cmH20] 27 cmH20   Intake/Output Summary (Last 24 hours) at 07/26/2018 1448 Last data filed at 07/26/2018 1000 Gross per 24 hour  Intake 849.4 ml  Output 525 ml  Net 324.4 ml   Filed Weights    07/24/18 0213 07/25/18 0830 07/26/18 0500  Weight: 109.8 kg 107.6 kg 107.6 kg    Examination: General: Obese appearing, ill, non-toxic, minimally responsive HENT:  Very dry mucus membranes. No JVD. No icterus or nystagmus. Pupils equal, reactive Lungs: Rhonchi bilaterally Cardiovascular: R3, S1S2. Basic critical care echo shows confirmed poor EF, underfilled RV and respirophasic IVC. Abdomen: soft, non-tender; bowel sounds normal; no masses,  no organomegaly Extremities: No mottling. Right knee has tophi. Non-erythematous. No cyanosis or clubbing Neuro: GCS: 2-2-5: 9 GU: Foley in place and required  Resolved Hospital Problem list    Assessment & Plan:  Sepsis with hypotension Acute hypoxic and hypercarbic respiratory Failure Hypotension, related to medications Pneumonia, CAP Combined chronic systolic/diastolic heart failure, dilated Pulmonary hypertension: I am suspicious that Echo may under-represent degree of pulmonary pressure. May need PA-cath Metabolic encephalopathy Metabolic acidosis from Diamox Exacerbation COPD  Plans: 1. Intubate 2. Volume replacement 3. Wean vasopressors 4. Follow repeat labs including ABG, BMP 5. Culture/Gram stain and viral PCR 6. Doxycycline and Azithromycin for atypical resistance. Patient has significant PCN allergy 7. Sepsis protocol--check lactic acid, cultures, fluids--judicious 8. Hold diuresis; discontinue ACEI, spironolactone.  9. Ventilator watch auto-peep and secretions; frequent SABA and discontinue Muscarinicmimetics to prevent GI ileus 10. Sedation with precedex 11. Steroids 12. Has bi-atrial enlargement. Watch for atrial fibrillation    Best practice:  Diet: To start in AM Pain/Anxiety/Delirium protocol (if indicated): Change to Precedex  VAP protocol (if indicated): Ordered including eye gtts DVT prophylaxis: Heparin subq GI prophylaxis: Pepcid 10 mg q12 Glucose control: Will check glucose with introduction of  steroids Mobility: Ventilator mobility protocol Code Status: Full Family Communication: Met with family. Consents obtained. Updated Disposition: Critical Care  Labs   CBC: Recent Labs  Lab 07/21/18 1123 07/24/18 0535 07/26/18 0337 07/26/18 0830  WBC 10.1 12.6* 15.2*  --   NEUTROABS  --   --  12.9*  --   HGB 13.7 14.7 14.8 18.4*  HCT 49.9 51.7 51.7 54.0*  MCV 90.7 90.1 91.0  --   PLT 380 357 288  --     Basic Metabolic Panel: Recent Labs  Lab 07/22/18 0451 07/23/18 0421 07/24/18 0535 07/25/18 0357 07/26/18 0337 07/26/18 0830  NA 140 136 139 138 136 132*  K 4.5 4.3 4.0 4.3 4.4 4.4  CL 89* 89* 93* 90* 87*  --  CO2 40* 34* 35* 36* 36*  --   GLUCOSE 142* 102* 108* 149* 130*  --   BUN 14 22 24* 32* 48*  --   CREATININE 1.31* 1.43* 1.43* 1.58* 2.66*  --   CALCIUM 9.1 9.4 9.5 9.6 9.2  --   MG 2.3  --  2.4  --  2.8*  --    GFR: Estimated Creatinine Clearance: 31 mL/min (A) (by C-G formula based on SCr of 2.66 mg/dL (H)). Recent Labs  Lab 07/21/18 1123 07/24/18 0535 07/26/18 0337  WBC 10.1 12.6* 15.2*    Liver Function Tests: Recent Labs  Lab 07/21/18 1123  AST 17  ALT 11  ALKPHOS 55  BILITOT 1.3*  PROT 6.4*  ALBUMIN 3.5   No results for input(s): LIPASE, AMYLASE in the last 168 hours. Recent Labs  Lab 07/26/18 1214  AMMONIA 20    ABG    Component Value Date/Time   PHART 7.219 (L) 07/26/2018 1040   PCO2ART 97.7 (HH) 07/26/2018 1040   PO2ART 104 07/26/2018 1040   HCO3 38.5 (H) 07/26/2018 1040   TCO2 44 (H) 07/26/2018 0830   O2SAT 97.1 07/26/2018 1040     Coagulation Profile: No results for input(s): INR, PROTIME in the last 168 hours.  Cardiac Enzymes: No results for input(s): CKTOTAL, CKMB, CKMBINDEX, TROPONINI in the last 168 hours.  HbA1C: Hgb A1c MFr Bld  Date/Time Value Ref Range Status  07/22/2018 09:55 AM 6.0 (H) 4.8 - 5.6 % Final    Comment:    (NOTE) Pre diabetes:          5.7%-6.4% Diabetes:              >6.4% Glycemic  control for   <7.0% adults with diabetes     CBG: Recent Labs  Lab 07/25/18 1625 07/25/18 1943 07/25/18 2313 07/26/18 0757 07/26/18 1210  GLUCAP 168* 131* 131* 107* 86    Review of Systems:     Past Medical History  He,  has a past medical history of Acute combined systolic and diastolic heart failure ([place]) (08/11/2016), Acute esophagitis (08/17/2016), Acute upper GI bleed (08/10/2016), Asthma, CAD (coronary artery disease) (08/16/2016), CHF (congestive heart failure) ([place]), DCM (dilated cardiomyopathy) ([place]), Dyspnea, HTN (hypertension), LBBB (left bundle branch block) (08/10/2016), Lymphadenopathy (08/17/2016), PUD (peptic ulcer disease) (08/17/2016), and Pulmonary HTN ([place]) (08/17/2016).   Surgical History    Past Surgical History:  Procedure Laterality Date  . ESOPHAGOGASTRODUODENOSCOPY (EGD) WITH PROPOFOL Left 08/11/2016   Procedure: ESOPHAGOGASTRODUODENOSCOPY (EGD) WITH PROPOFOL;  Surgeon: Arta Silence, MD;  Location: [facility] ENDOSCOPY;  Service: Endoscopy;  Laterality: Left;  . RIGHT/LEFT HEART CATH AND CORONARY ANGIOGRAPHY N/A 08/15/2016   Procedure: Right/Left Heart Cath and Coronary Angiography;  Surgeon: Jettie Booze, MD;  Location: [facility];  Service: Cardiovascular;  Laterality: N/A;     Social History   reports that he has quit smoking. He has never used smokeless tobacco. He reports that he does not drink alcohol or use drugs.   Family History   His family history includes Hypertension in his mother and sister.   Allergies Allergies  Allergen Reactions  . Penicillins     Pt reports it makes him feel shaky Has patient had a PCN reaction causing immediate rash, facial/tongue/throat swelling, SOB or lightheadedness with hypotension: YES Has patient had a PCN reaction causing severe rash involving mucus membranes or skin necrosis: NO Has patient had a PCN reaction that required hospitalization NO Has patient had a PCN reaction occurring  within the last 10 years:  NO If all of the above answers are "NO", then may proceed with Cephalosporin use.     Home Medications  Prior to Admission medications   Medication Sig Start Date End Date Taking? Authorizing Provider  acetaminophen (TYLENOL) 325 MG tablet Take 650 mg by mouth every 6 (six) hours as needed.   Yes [provider]  albuterol (PROVENTIL) (2.5 MG/3ML) 0.083% nebulizer solution Inhale 3 mLs into the lungs every 6 (six) hours as needed for wheezing or shortness of breath. 06/20/18  Yes [provider]  budesonide-formoterol (SYMBICORT) 80-4.5 MCG/ACT inhaler Inhale 1 puff into the lungs 2 (two) times daily. 08/17/16  Yes Rama, Venetia Maxon, MD  carvedilol (COREG) 12.5 MG tablet TAKE 1 TABLET(12.5 MG) BY MOUTH TWICE DAILY WITH A MEAL Patient taking differently: Take 12.5 mg by mouth 2 (two) times daily with a meal.  03/13/18  Yes Turner, Traci R, MD  ferrous sulfate 325 (65 FE) MG tablet Take 325 mg by mouth daily with breakfast.   Yes [provider]  furosemide (LASIX) 40 MG tablet Take 40 mg by mouth daily. 07/18/18  Yes [provider]  omeprazole (PRILOSEC) 40 MG capsule Take 40 mg by mouth daily. 30 min prior to a meal 07/09/18  Yes [provider]  pantoprazole (PROTONIX) 40 MG tablet Take 1 tablet (40 mg total) by mouth daily. Please make appt for future refills 1st attempt. 07/21/18  Yes Turner, Eber Hong, MD  furosemide (LASIX) 20 MG tablet Take 1 tablet (20 mg total) by mouth daily. Patient not taking: Reported on 07/21/2018 12/07/16   Sueanne Margarita, MD  sacubitril-valsartan (ENTRESTO) 49-51 MG Take 1 tablet by mouth 2 (two) times daily. Please keep upcoming appt in December with Dr. Radford Pax for future refills. Thank you 03/25/18   Sueanne Margarita, MD  spironolactone (ALDACTONE) 25 MG tablet TAKE 1/2 TABLET(12.5 MG) BY MOUTH DAILY Patient not taking: Reported on 07/21/2018 03/13/18   Sueanne Margarita, MD     Critical care time: 90 minutes

## 2018-07-26 NOTE — Plan of Care (Signed)
Plan of Care Note  Called to room by nurse regarding patient refusing to wear BPAP. She notes he was calm, conversant, and following commands earlier when his son was at bedside though now has become more agitated.  He was started on low-dose Precedex after which we were able to place the BiPAP and he was resting comfortably.  We will continue with BiPAP, if he starts to become less responsive we will check a blood gas and titrate as needed or intubate.  At this time he easily awakes and no agitated is clearly protecting his airway and does not require intubation at this time.  [place] Farrie Sann, MD [facility] Pulmonology/Critical Care Pager [phone] After hours pager: [phone]

## 2018-07-26 NOTE — Progress Notes (Signed)
Patient has required significant sedation. He self-extubated and his airway initially managed by astute nurse who immediately provided BMV ventilation assisted by additional nursing staff. Notified this author immediately and patient re-intubated with glidescope. Mild-moderate erythema noted of arye-epiglottic regions. No difficulty with glidescope.    CC time 20 minutes

## 2018-07-26 NOTE — Progress Notes (Signed)
Critical ABG results given to Flo Shanks MD at 1507.   Jacqulynn Cadet RRT

## 2018-07-26 NOTE — Progress Notes (Signed)
Critical ABG values given to S. Zanders, MD. 

## 2018-07-27 ENCOUNTER — Inpatient Hospital Stay (HOSPITAL_COMMUNITY): Payer: Medicare Other

## 2018-07-27 LAB — BASIC METABOLIC PANEL
Anion gap: 12 (ref 5–15)
BUN: 50 mg/dL — AB (ref 8–23)
CO2: 30 mmol/L (ref 22–32)
CREATININE: 1.78 mg/dL — AB (ref 0.61–1.24)
Calcium: 9.1 mg/dL (ref 8.9–10.3)
Chloride: 96 mmol/L — ABNORMAL LOW (ref 98–111)
GFR calc Af Amer: 45 mL/min — ABNORMAL LOW (ref >60)
GFR calc non Af Amer: 39 mL/min — ABNORMAL LOW (ref >60)
Glucose, Bld: 188 mg/dL — ABNORMAL HIGH (ref 70–99)
Potassium: 3.8 mmol/L (ref 3.5–5.1)
Sodium: 138 mmol/L (ref 135–145)

## 2018-07-27 LAB — MAGNESIUM
Magnesium: 2.7 mg/dL — ABNORMAL HIGH (ref 1.7–2.4)
Magnesium: 2.5 mg/dL — ABNORMAL HIGH (ref 1.7–2.4)

## 2018-07-27 LAB — RESPIRATORY PANEL BY PCR
Adenovirus: NOT DETECTED
Bordetella pertussis: NOT DETECTED
CORONAVIRUS OC43-RVPPCR: NOT DETECTED
Chlamydophila pneumoniae: NOT DETECTED
Coronavirus 229E: NOT DETECTED
Coronavirus HKU1: NOT DETECTED
Coronavirus NL63: NOT DETECTED
Influenza A: NOT DETECTED
Influenza B: NOT DETECTED
METAPNEUMOVIRUS-RVPPCR: NOT DETECTED
Mycoplasma pneumoniae: NOT DETECTED
Parainfluenza Virus 1: NOT DETECTED
Parainfluenza Virus 2: NOT DETECTED
Parainfluenza Virus 3: NOT DETECTED
Parainfluenza Virus 4: NOT DETECTED
Respiratory Syncytial Virus: NOT DETECTED
Rhinovirus / Enterovirus: NOT DETECTED

## 2018-07-27 LAB — POCT I-STAT 7, (LYTES, BLD GAS, ICA,H+H)
ACID-BASE EXCESS: 6 mmol/L — AB (ref 0.0–2.0)
Bicarbonate: 33.4 mmol/L — ABNORMAL HIGH (ref 20.0–28.0)
Calcium, Ion: 1.18 mmol/L (ref 1.15–1.40)
HCT: 49 % (ref 39.0–52.0)
Hemoglobin: 16.7 g/dL (ref 13.0–17.0)
O2 Saturation: 95 %
Patient temperature: 98.6
Potassium: 3.9 mmol/L (ref 3.5–5.1)
Sodium: 138 mmol/L (ref 135–145)
TCO2: 35 mmol/L — ABNORMAL HIGH (ref 22–32)
pCO2 arterial: 57.9 mmHg — ABNORMAL HIGH (ref 32.0–48.0)
pH, Arterial: 7.369 (ref 7.350–7.450)
pO2, Arterial: 78 mmHg — ABNORMAL LOW (ref 83.0–108.0)

## 2018-07-27 LAB — CBC WITH DIFFERENTIAL/PLATELET
Abs Immature Granulocytes: 0.14 10*3/uL — ABNORMAL HIGH (ref 0.00–0.07)
Basophils Absolute: 0 10*3/uL (ref 0.0–0.1)
Basophils Relative: 0 %
EOS PCT: 0 %
Eosinophils Absolute: 0 10*3/uL (ref 0.0–0.5)
HCT: 50 % (ref 39.0–52.0)
HEMOGLOBIN: 14.4 g/dL (ref 13.0–17.0)
Immature Granulocytes: 1 %
Lymphocytes Relative: 1 %
Lymphs Abs: 0.2 10*3/uL — ABNORMAL LOW (ref 0.7–4.0)
MCH: 25.3 pg — ABNORMAL LOW (ref 26.0–34.0)
MCHC: 28.8 g/dL — ABNORMAL LOW (ref 30.0–36.0)
MCV: 87.9 fL (ref 80.0–100.0)
MONOS PCT: 2 %
Monocytes Absolute: 0.5 10*3/uL (ref 0.1–1.0)
Neutro Abs: 21.5 10*3/uL — ABNORMAL HIGH (ref 1.7–7.7)
Neutrophils Relative %: 96 %
Platelets: 213 10*3/uL (ref 150–400)
RBC: 5.69 MIL/uL (ref 4.22–5.81)
RDW: 18.6 % — ABNORMAL HIGH (ref 11.5–15.5)
WBC: 22.3 10*3/uL — ABNORMAL HIGH (ref 4.0–10.5)
nRBC: 0 % (ref 0.0–0.2)

## 2018-07-27 LAB — COOXEMETRY PANEL
Carboxyhemoglobin: 1.6 % — ABNORMAL HIGH (ref 0.5–1.5)
Methemoglobin: 1.3 % (ref 0.0–1.5)
O2 Saturation: 72.4 %
Total hemoglobin: 14.6 g/dL (ref 12.0–16.0)

## 2018-07-27 LAB — GLUCOSE, CAPILLARY
Glucose-Capillary: 132 mg/dL — ABNORMAL HIGH (ref 70–99)
Glucose-Capillary: 140 mg/dL — ABNORMAL HIGH (ref 70–99)
Glucose-Capillary: 142 mg/dL — ABNORMAL HIGH (ref 70–99)
Glucose-Capillary: 157 mg/dL — ABNORMAL HIGH (ref 70–99)
Glucose-Capillary: 172 mg/dL — ABNORMAL HIGH (ref 70–99)
Glucose-Capillary: 188 mg/dL — ABNORMAL HIGH (ref 70–99)
Glucose-Capillary: 199 mg/dL — ABNORMAL HIGH (ref 70–99)

## 2018-07-27 LAB — PHOSPHORUS
Phosphorus: 4.5 mg/dL (ref 2.5–4.6)
Phosphorus: 3.2 mg/dL (ref 2.5–4.6)

## 2018-07-27 MED ORDER — VITAL HIGH PROTEIN PO LIQD
1000.0000 mL | ORAL | Status: DC
  Administered 2018-07-27: 1000 mL

## 2018-07-27 MED ORDER — PRO-STAT SUGAR FREE PO LIQD
30.0000 mL | Freq: Two times a day (BID) | ORAL | Status: DC
  Administered 2018-07-27 – 2018-07-30 (×6): 30 mL
  Filled 2018-07-27 (×6): qty 30

## 2018-07-27 MED ORDER — FAMOTIDINE 40 MG/5ML PO SUSR
20.0000 mg | Freq: Two times a day (BID) | ORAL | Status: DC
  Administered 2018-07-27 – 2018-07-30 (×6): 20 mg via ORAL
  Filled 2018-07-27 (×6): qty 2.5

## 2018-07-27 MED ORDER — POTASSIUM CHLORIDE 20 MEQ PO PACK
40.0000 meq | PACK | Freq: Once | ORAL | Status: AC
  Administered 2018-07-27: 40 meq
  Filled 2018-07-27: qty 2

## 2018-07-27 MED ORDER — FUROSEMIDE 10 MG/ML IJ SOLN
60.0000 mg | Freq: Two times a day (BID) | INTRAMUSCULAR | Status: AC
  Administered 2018-07-27: 30 mg via INTRAVENOUS
  Administered 2018-07-27: 60 mg via INTRAVENOUS
  Filled 2018-07-27 (×2): qty 6

## 2018-07-27 NOTE — Progress Notes (Signed)
Patient ID: Kristopher [name], male   DOB: December 28, 1950, 68 y.o.   MRN: [id number]     Advanced Heart Failure Rounding Note  PCP-Cardiologist: Fransico Him, MD   Subjective:    Patient intubated yesterday with acute hypercarbic respiratory failure and lethargy/unable to protect airway. Had transient hypotension requiring pressors, now off with SBP in 110s.    He is on Solumedrol + doxycycline/azithro per CCM for COPD exacerbation.   Creatinine 2.66 => 1.78.  He has IV fluid infusing, CVP 14-15.    Echo 07/22/18: EF 25-30%, global HK, RV normal function, increased wall thickness, LA moderately dilated, RA severely dilated  Objective:   Weight Range: 107.4 kg Body mass index is 38.22 kg/m.   Vital Signs:   Temp:  [97.8 F (36.6 C)-100.3 F (37.9 C)] 98.3 F (36.8 C) (02/16 0820) Pulse Rate:  [57-96] 69 (02/16 0800) Resp:  [12-39] 16 (02/16 0800) BP: (64-151)/(33-80) 134/71 (02/16 0800) SpO2:  [69 %-99 %] 97 % (02/16 0800) Arterial Line BP: (95-188)/(56-87) 95/87 (02/16 0800) FiO2 (%):  [50 %-60 %] 50 % (02/16 0759) Weight:  [107.4 kg] 107.4 kg (02/16 0200) Last BM Date: 07/21/18  Weight change: Filed Weights   07/25/18 0830 07/26/18 0500 07/27/18 0200  Weight: 107.6 kg 107.6 kg 107.4 kg    Intake/Output:   Intake/Output Summary (Last 24 hours) at 07/27/2018 1014 Last data filed at 07/27/2018 0900 Gross per 24 hour  Intake 2408.2 ml  Output 2175 ml  Net 233.2 ml      Physical Exam    General: Intubated/sedated.  Neck: JVP difficult with thick neck, no thyromegaly or thyroid nodule.  Lungs: Distant BS.  CV: Nonpalpable PMI.  Heart regular S1/S2, no S3/S4, no murmur.  No peripheral edema.   Abdomen: Soft, nontender, no hepatosplenomegaly, no distention.  Skin: Intact without lesions or rashes.  Neurologic: Sedated   Extremities: No clubbing or cyanosis.  HEENT: Normal.    Telemetry   NSR 80s. Personally reviewed.   EKG    No new tracings.   Labs     CBC Recent Labs    07/26/18 0337  07/27/18 0427 07/27/18 0500  WBC 15.2*  --   --  22.3*  NEUTROABS 12.9*  --   --  21.5*  HGB 14.8   < > 16.7 14.4  HCT 51.7   < > 49.0 50.0  MCV 91.0  --   --  87.9  PLT 288  --   --  213   < > = values in this interval not displayed.   Basic Metabolic Panel Recent Labs    07/26/18 0337  07/26/18 1429  07/27/18 0427 07/27/18 0500  NA 136   < > 137   < > 138 138  K 4.4   < > 3.9   < > 3.9 3.8  CL 87*  --  88*  --   --  96*  CO2 36*  --  32  --   --  30  GLUCOSE 130*  --  149*  --   --  188*  BUN 48*  --  53*  --   --  50*  CREATININE 2.66*  --  2.70*  --   --  1.78*  CALCIUM 9.2  --  8.6*  --   --  9.1  MG 2.8*  --   --   --   --  2.7*  PHOS  --   --   --   --   --  4.5   < > = values in this interval not displayed.   Liver Function Tests No results for input(s): AST, ALT, ALKPHOS, BILITOT, PROT, ALBUMIN in the last 72 hours. No results for input(s): LIPASE, AMYLASE in the last 72 hours. Cardiac Enzymes No results for input(s): CKTOTAL, CKMB, CKMBINDEX, TROPONINI in the last 72 hours.  BNP: BNP (last 3 results) Recent Labs    07/21/18 1123  BNP 964.4*    ProBNP (last 3 results) No results for input(s): PROBNP in the last 8760 hours.   D-Dimer No results for input(s): DDIMER in the last 72 hours. Hemoglobin A1C No results for input(s): HGBA1C in the last 72 hours. Fasting Lipid Panel Recent Labs    07/26/18 1214  TRIG 200*   Thyroid Function Tests No results for input(s): TSH, T4TOTAL, T3FREE, THYROIDAB in the last 72 hours.  Invalid input(s): FREET3  Other results:   Imaging    Dg Abd 1 View  Result Date: 07/26/2018 CLINICAL DATA:  Tube placement EXAM: ABDOMEN - 1 VIEW COMPARISON:  None. FINDINGS: Nasogastric tube with tip in the stomach. Side port at the GE junction. IMPRESSION: NG tube is tip in the stomach.  Side port at the GE junction Electronically Signed   By: Suzy Bouchard M.D.   On: 07/26/2018  18:21   Dg Chest Port 1 View  Result Date: 07/27/2018 CLINICAL DATA:  Hypoxia EXAM: PORTABLE CHEST 1 VIEW COMPARISON:  July 26, 2018 FINDINGS: Endotracheal tube tip is 4.0 cm above the carina. Nasogastric tube tip and side port are in the stomach. Central catheter tip is in the left innominate vein. No pneumothorax. There is cardiomegaly with pulmonary vascularity normal. There Is medial bibasilar atelectasis. Lungs elsewhere are clear. There is aortic atherosclerosis. No bone lesions. IMPRESSION: Tube and catheter positions as described without pneumothorax. Persistent cardiomegaly. Medial bibasilar atelectasis present. Aortic Atherosclerosis (ICD10-I70.0). Electronically Signed   By: Lowella Grip III M.D.   On: 07/27/2018 07:47   Dg Chest Port 1 View  Result Date: 07/26/2018 CLINICAL DATA:  Intubation EXAM: PORTABLE CHEST 1 VIEW COMPARISON:  07/26/2018 FINDINGS: Endotracheal tube approximately 6 cm from carina. NG tube in stomach. Central venous line unchanged. Normal cardiac silhouette.  Lungs are clear. IMPRESSION: Endotracheal tube appears in good position Electronically Signed   By: Suzy Bouchard M.D.   On: 07/26/2018 18:20   Dg Chest Port 1 View  Result Date: 07/26/2018 CLINICAL DATA:  Central line placement and intubation. EXAM: PORTABLE CHEST 1 VIEW COMPARISON:  07/24/2018 and 07/21/2018. FINDINGS: Endotracheal tube tip is in the mid trachea. Left IJ central venous catheter projects to the right of the aortic arch, tip over the trachea at the level of the upper SVC. The heart size and mediastinal contours are stable. Probable emphysematous changes at the right apex and mild bibasilar atelectasis. No pneumothorax or significant pleural effusion. IMPRESSION: Satisfactory position of the endotracheal tube. Central line terminates at the level of the upper SVC, adjacent to the aortic arch; clinical correlation necessary to confirm venous location. No pneumothorax. Electronically Signed    By: Richardean Sale M.D.   On: 07/26/2018 14:15     Medications:     Scheduled Medications: . albuterol  2.5 mg Nebulization Q4H  . aspirin  81 mg Oral Daily  . chlorhexidine gluconate (MEDLINE KIT)  15 mL Mouth Rinse BID  . Chlorhexidine Gluconate Cloth  6 each Topical Daily  . eye wash  2 drop Both Eyes Q6H  . famotidine  10.4  mg Oral BID  . furosemide  60 mg Intravenous BID  . heparin  5,000 Units Subcutaneous Q8H  . insulin aspart  0-15 Units Subcutaneous Q4H  . mouth rinse  15 mL Mouth Rinse q12n4p  . mouth rinse  15 mL Mouth Rinse 10 times per day  . methylPREDNISolone (SOLU-MEDROL) injection  40 mg Intravenous Q12H  . potassium chloride  40 mEq Per Tube Once  . sodium chloride flush  10-40 mL Intracatheter Q12H  . sodium chloride flush  3 mL Intravenous Q12H    Infusions: . sodium chloride    . sodium chloride    . azithromycin Stopped (07/26/18 1639)  . dexmedetomidine 1.1 mcg/kg/hr (07/27/18 0943)  . doxycycline (VIBRAMYCIN) IV Stopped (07/27/18 0650)  . fentaNYL infusion INTRAVENOUS 300 mcg/hr (07/27/18 0900)  . lactated ringers Stopped (07/27/18 1011)  . norepinephrine (LEVOPHED) Adult infusion Stopped (07/26/18 1735)  . propofol (DIPRIVAN) infusion Stopped (07/27/18 0207)  . 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   Assessment/Plan  1. Acute on chronic systolic/diastolic HF - Echo 7/90/24 EF 45%. Cath with minimal CAD - Echo 07/22/18: EF 25-30%, global HK, RV normal  function, increased wall thickness, LA moderately dilated, RA severely dilated - With AKI yesterday, he got significant IV fluid.  Now intubated with CVP 14-15.  Will stop IVF and will give Lasix 60 mg IV bid x 2 doses today.  Creatinine back down to 1.78.    - Off Entresto and spironolactone with AKI and low BP.  - Send co-ox.   2. Acute hypoxic respiratory failure - Likely combination of HF and AECOPD. CXR today with bibasilar atelectasis.  - Now intubated, treating AECOPD with doxycycline + azithromycin and Solumedrol => per CCM.    3. Undiagnosed OSA - Will need outpatient sleep study.Will likely need to set him up with BiPAP prior to DC.   4. DM2 - Per primary team - Consider SGLT2i at d/c. No change.   5. AKI - Resolving, creatinine down to 1.78.    CRITICAL CARE Performed by: Loralie Champagne  Total critical care time: 35 minutes  Critical care time was exclusive of separately billable procedures and treating other patients.  Critical care was necessary to treat or prevent imminent or life-threatening deterioration.  Critical care was time spent personally by me (independent of midlevel providers or residents) on the following activities: development of treatment plan with patient and/or surrogate as well as nursing, discussions with consultants, evaluation of patient's response to treatment, examination of patient, obtaining history from patient or surrogate, ordering and performing treatments and interventions, ordering and review of laboratory studies, ordering and review of radiographic studies, pulse oximetry and re-evaluation of patient's condition.  Loralie Champagne, MD  10:14 AM

## 2018-07-27 NOTE — Progress Notes (Signed)
Report given to [place], Charity fundraiser. All drips & plan of care discussed. Care relinquished at this time.

## 2018-07-27 NOTE — Plan of Care (Signed)
Patient remains sedated and intubated making it impossible to educate him.

## 2018-07-27 NOTE — Progress Notes (Signed)
-  NAME:  Kristopher [name], MRN:  [id number], DOB:  Oct 15, 1950, LOS: 6 ADMISSION DATE:  07/21/2018, CONSULTATION DATE:   REFERRING MD:  CHIEF COMPLAINT:  Respiratory Failure  Brief History   68 year old man presented 5days ago to ED with progressive SOB and edema. Had approximately 15kg weight gain. Known mixed HFREF and diastolic heart function with COPD/Tobacco use Admitted to floor initially, then transferred 2.14.2020 to stepdown secondary to hypercapnia and encephalopathy. Started on BiPAP, continued on diuretics including diamox.Steroids discontinued. Felt to be heart failure related  History of present illness   68 year old male with chronic systolic and diastolic CHF, new EF of 30% a, history of questionable diabetes, tobacco use, asthma/COPD presented to the ED with progressive dyspnea over 2 weeks associated with lower extremity edema orthopnea PND and 30 pound weight gain. -Patient reported compliance with medications -In addition reports a recent URI cough and wheezing and this was confirmed by family   Interim history/subjective:  2.16.2020: Patient intubated as noted below. Self-extubated a few hours after with difficult to control agitation. Improved. Responded to volume and diuresis held. Off vasopressors. Given lasix today by HF team and had transient hypotension requiring transient use of vasopressor. Now off Lungs have improved clinically on steroids and antibiotics.   2.15.2020: Patient became more encephalopathic. Acidosis worsened presumably from diamox and respiratory failure. Difficult to arouse. Family endorses URI and "bronchitis" prior to admission Exam under ultrasound showed significant respirophasic changes of IVC during hypotension. LV dilated and confirmed low EF. RV underfilled as well. Central line and intubation done  Significant Hospital Events   2.15.2020: Worsening respiratory status despite aggressive medical treatment for presumed heart failure. Dry  mucus membranes on exam with US evidence of hypovolemia. Required intubation. Excessive thick secretions. Central line placed. Transient hypotension requiring volume replacement and vasopressors. Acute kidney dysfunction. Started on CAP treatment.  Self-extubated and re-intubated. Lines placed 2.14.2020:Admitted to ICU with worsening respiratory failure despite treatment for heart failure.  Past Medical History   Past Medical History:  Diagnosis Date  . Acute combined systolic and diastolic heart failure (HCC) 08/11/2016  . Acute esophagitis 08/17/2016  . Acute upper GI bleed 08/10/2016  . Asthma   . CAD (coronary artery disease) 08/16/2016  . CHF (congestive heart failure) (HCC)   . DCM (dilated cardiomyopathy) (HCC)   . Dyspnea   . HTN (hypertension)   . LBBB (left bundle branch block) 08/10/2016  . Lymphadenopathy 08/17/2016  . PUD (peptic ulcer disease) 08/17/2016  . Pulmonary HTN (HCC) 08/17/2016   Past Surgical History:  Procedure Laterality Date  . ESOPHAGOGASTRODUODENOSCOPY (EGD) WITH PROPOFOL Left 08/11/2016   Procedure: ESOPHAGOGASTRODUODENOSCOPY (EGD) WITH PROPOFOL;  Surgeon: Willis ModenaWilliam Outlaw, MD;  [facility];  Service: Endoscopy;  Laterality: Left;  . RIGHT/LEFT HEART CATH AND CORONARY ANGIOGRAPHY N/A 08/15/2016   Procedure: Right/Left Heart Cath and Coronary Angiography;  Surgeon: Corky CraftsJayadeep S Varanasi, MD;  [facility];  Service: Cardiovascular;  Laterality: N/A;     Consults:  Treatment Team:  Lbcardiology, Rounding, MD  Critical Care  Procedures:  Central line: Left IJ 2.15.2020 Intubation: #8, 23 at lip; 2.15.2020  Significant Diagnostic Tests:  Echocardiogram: 2.11.2020: 1. The left ventricle has severely reduced systolic function of 25-30%. The cavity size was normal. Left ventricular diastolic Doppler parameters are consistent with impaired relaxation Left ventrical global hypokinesis without obvious regional wall  motion abnormalities. The patient  refused Definity contrast, the evaluation of wall motion is suboptimal.  2. The right  ventricle has normal systolic function. The cavity was moderately enlarged. There is no increase in right ventricular wall thickness. Right ventricular systolic pressure normal with an estimated pressure of 28.6 mmHg.  3. Left atrial size was moderately dilated.  4. Right atrial size was severely dilated.  5. The mitral valve is normal in structure.  6. The tricuspid valve is normal in structure.  7. The aortic valve is normal in structure.  FINDINGS  Left Ventricle: The left ventricle has moderate-severely reduced systolic function of 30-35%. The cavity size was normal. There is moderate concentric left ventricular hypertrophy. Left ventricular diastolic Doppler parameters are consistent with  impaired relaxation (grade I) Normal left ventricular filling pressures Left ventrical global hypokinesis without regional wall motion abnormalities. Right Ventricle: The right ventricle has normal systolic function. The cavity was moderately enlarged. There is no increase in right ventricular wall thickness. Right ventricular systolic pressure normal with an estimated pressure of 28.6 mmHg. Left Atrium: left atrial size was moderately dilated Right Atrium: right atrial size was severely dilated Interatrial Septum: No atrial level shunt detected by color flow Doppler. Pericardium: There is no evidence of pericardial effusion. Mitral Valve: The mitral valve is normal in structure. Mitral valve regurgitation is not visualized by color flow Doppler. Tricuspid Valve: The tricuspid valve is normal in structure. Tricuspid valve regurgitation is mild by color flow Doppler. Aortic Valve: The aortic valve is normal in structure. Aortic valve regurgitation was not visualized by color flow Doppler. Pulmonic Valve: The pulmonic valve was not well visualized. The pulmonic valve was grossly normal. Pulmonic valve regurgitation is not  visualized by color flow Doppler. Venous: The inferior vena cava is normal in size with greater than 50% respiratory variability.   LEFT VENTRICLE PLAX 2D (Teich) LV EF:          45.2 %   Diastology LVIDd:          5.30 cm  LV e' lateral:   5.44 cm/s LVIDs:          4.10 cm  LV E/e' lateral: 9.4 LV PW:          1.50 cm LV IVS:         1.70 cm LVOT diam:      2.00 cm LV SV:          61 ml LVOT Area:      3.14 cm  RIGHT VENTRICLE RV Basal diam:  4.76 cm RV S prime:     10.20 cm/s TAPSE (M-mode): 1.4 cm RVSP:           28.6 mmHg  LEFT ATRIUM             Index       RIGHT ATRIUM           Index LA diam:        4.70 cm 2.13 cm/m  RA Pressure: 8 mmHg LA Vol (A2C):   58.5 ml 26.53 ml/m RA Area:     32.30 cm LA Vol (A4C):   82.0 ml 37.19 ml/m RA Volume:   121.00 ml 54.87 ml/m LA Biplane Vol: 72.1 ml 32.70 ml/m  AORTIC VALVE LVOT Vmax:   82.53 cm/s LVOT Vmean:  54.467 cm/s LVOT VTI:    0.127 m   AORTA Ao Root diam: 3.10 cm  MV E velocity: 50.93 cm/s TR Peak grad: 20.6 mmHg MV A velocity: 66.94 cm/s TR Vmax:      244.00 cm/s MV E/A ratio:  0.76  RVSP:         28.6 mmHg   Micro Data:  Gram stain and culture 2.15.2020: Recent Results (from the past 240 hour(s))  MRSA PCR Screening     Status: None   Collection Time: 07/25/18 10:25 AM  Result Value Ref Range Status   MRSA by PCR NEGATIVE NEGATIVE Final    Comment:        The GeneXpert MRSA Assay (FDA approved for NASAL specimens only), is one component of a comprehensive MRSA colonization surveillance program. It is not intended to diagnose MRSA infection nor to guide or monitor treatment for MRSA infections. Performed at [facility], [address], [place], [place] [address]   Culture, respiratory (non-expectorated)     Status: None (Preliminary result)   Collection Time: 07/26/18  1:26 PM  Result Value Ref Range Status   Specimen Description TRACHEAL ASPIRATE  Final   Special Requests Normal   Final   Gram Stain   Final    ABUNDANT WBC PRESENT, PREDOMINANTLY PMN RARE SQUAMOUS EPITHELIAL CELLS PRESENT MODERATE GRAM POSITIVE RODS RARE GRAM POSITIVE COCCI IN PAIRS RARE GRAM NEGATIVE RODS    Culture   Final    TOO YOUNG TO READ Performed at [facility], [address] [place], [place] [address]    Report Status PENDING  Incomplete  Respiratory Panel by PCR     Status: None   Collection Time: 07/26/18  5:32 PM  Result Value Ref Range Status   Adenovirus NOT DETECTED NOT DETECTED Final   Coronavirus 229E NOT DETECTED NOT DETECTED Final    Comment: (NOTE) The Coronavirus on the Respiratory Panel, DOES NOT test for the novel  Coronavirus (2019 nCoV)    Coronavirus HKU1 NOT DETECTED NOT DETECTED Final   Coronavirus NL63 NOT DETECTED NOT DETECTED Final   Coronavirus OC43 NOT DETECTED NOT DETECTED Final   Metapneumovirus NOT DETECTED NOT DETECTED Final   Rhinovirus / Enterovirus NOT DETECTED NOT DETECTED Final   Influenza A NOT DETECTED NOT DETECTED Final   Influenza B NOT DETECTED NOT DETECTED Final   Parainfluenza Virus 1 NOT DETECTED NOT DETECTED Final   Parainfluenza Virus 2 NOT DETECTED NOT DETECTED Final   Parainfluenza Virus 3 NOT DETECTED NOT DETECTED Final   Parainfluenza Virus 4 NOT DETECTED NOT DETECTED Final   Respiratory Syncytial Virus NOT DETECTED NOT DETECTED Final   Bordetella pertussis NOT DETECTED NOT DETECTED Final   Chlamydophila pneumoniae NOT DETECTED NOT DETECTED Final   Mycoplasma pneumoniae NOT DETECTED NOT DETECTED Final    Comment: Performed at [facility], [address], [place], [place] [address]    Antimicrobials:  Doxycycline Azithromycin to cover atypicals resistant to doxy   Objective   Blood pressure 130/69, pulse 64, temperature 98.2 F (36.8 C), temperature source Oral, resp. rate (!) 6, height 5\' 6"  (1.676 m), weight 107.4 kg, SpO2 98 %. CVP:  [14 mmHg-20 mmHg] 15 mmHg  Vent Mode: PRVC FiO2 (%):  [50 %] 50 % Set  Rate:  [16 bmp] 16 bmp Vt Set:  [510 mL] 510 mL PEEP:  [10 cmH20] 10 cmH20 Plateau Pressure:  [21 cmH20-23 cmH20] 21 cmH20   Intake/Output Summary (Last 24 hours) at 07/27/2018 2104 Last data filed at 07/27/2018 2000 Gross per 24 hour  Intake 2743.77 ml  Output 4335 ml  Net -1591.23 ml   Filed Weights   07/25/18 0830 07/26/18 0500 07/27/18 0200  Weight: 107.6 kg 107.6 kg 107.4 kg  Examination: General: Obese appearing, ill, non-toxic, responds to voice on precedex HENT:  Mucus membranes less dry. No JVD. No icterus or nystagmus. Pupils equal, reactive. Left IJ CVC in-tact, no erythema Lungs: Mild Rhonchi bilaterally Cardiovascular: R3, S1S2. Basic critical care echo shows confirmed poor EF today,  RV more robust and filled, No respirophasic IVC and now dilated. Abdomen: soft, non-tender; bowel sounds normal; no masses,  no organomegaly Extremities: No mottling. Right knee has tophi. Non-erythematous. No cyanosis or clubbing Neuro: GCS: 2-1i-5:*i GU: Foley in place and required Skin: No mottling or lesions  Resolved Hospital Problem list    Assessment & Plan:  Sepsis with hypotension-resolved Acute hypoxic and hypercarbic respiratory Failure-improved. Unable to wean today Hypotension, related to medications/Hypovolemia-resolved Pneumonia, CAP-on doxycycline. Responding. Cultures still pending but viral panel negative Combined chronic systolic/diastolic heart failure, dilated-HF group started diuresis Pulmonary hypertension: I am suspicious that Echo may under-represent degree of pulmonary pressure. May need PA-cath Metabolic encephalopathy-on Precedex Metabolic acidosis from Diamox Exacerbation COPD  Plans: 1. Begin vent wean in 24 hours 2. Start tube feeds 3. Judicious fluid management 4. Precedex 5. Follow ABG 6. Follow cultures 7. Hold ACE and spironolactone for now     Best practice:  Diet: Ordered tube feeds Pain/Anxiety/Delirium protocol (if indicated):  Changed to Precedex  VAP protocol (if indicated): Ordered including eye gtts DVT prophylaxis: Heparin subq GI prophylaxis: Pepcid 20 mg q12 Glucose control: Will check glucose with introduction of steroids Mobility: Ventilator mobility protocol Code Status: Full Family Communication: Family not present. Disposition: Remain in Critical Care on vent  Labs   CBC: Recent Labs  Lab 07/21/18 1123 07/24/18 0535 07/26/18 0337 07/26/18 0830 07/26/18 1507 07/26/18 1828 07/27/18 0427 07/27/18 0500  WBC 10.1 12.6* 15.2*  --   --   --   --  22.3*  NEUTROABS  --   --  12.9*  --   --   --   --  21.5*  HGB 13.7 14.7 14.8 18.4* 17.7* 16.7 16.7 14.4  HCT 49.9 51.7 51.7 54.0* 52.0 49.0 49.0 50.0  MCV 90.7 90.1 91.0  --   --   --   --  87.9  PLT 380 357 288  --   --   --   --  213    Basic Metabolic Panel: Recent Labs  Lab 07/22/18 0451  07/24/18 0535 07/25/18 0357 07/26/18 0337  07/26/18 1429 07/26/18 1507 07/26/18 1828 07/27/18 0427 07/27/18 0500  NA 140   < > 139 138 136   < > 137 136 136 138 138  K 4.5   < > 4.0 4.3 4.4   < > 3.9 4.0 4.0 3.9 3.8  CL 89*   < > 93* 90* 87*  --  88*  --   --   --  96*  CO2 40*   < > 35* 36* 36*  --  32  --   --   --  30  GLUCOSE 142*   < > 108* 149* 130*  --  149*  --   --   --  188*  BUN 14   < > 24* 32* 48*  --  53*  --   --   --  50*  CREATININE 1.31*   < > 1.43* 1.58* 2.66*  --  2.70*  --   --   --  1.78*  CALCIUM 9.1   < > 9.5 9.6 9.2  --  8.6*  --   --   --  9.1  MG 2.3  --  2.4  --  2.8*  --   --   --   --   --  2.7*  PHOS  --   --   --   --   --   --   --   --   --   --  4.5   < > = values in this interval not displayed.   GFR: Estimated Creatinine Clearance: 46.3 mL/min (A) (by C-G formula based on SCr of 1.78 mg/dL (H)). Recent Labs  Lab 07/21/18 1123 07/24/18 0535 07/26/18 0337 07/26/18 1214 07/26/18 1744 07/26/18 2312 07/27/18 0500  PROCALCITON  --   --   --  0.12  --   --   --   WBC 10.1 12.6* 15.2*  --   --   --  22.3*   LATICACIDVEN  --   --   --   --  1.1 1.7  --     Liver Function Tests: Recent Labs  Lab 07/21/18 1123  AST 17  ALT 11  ALKPHOS 55  BILITOT 1.3*  PROT 6.4*  ALBUMIN 3.5   No results for input(s): LIPASE, AMYLASE in the last 168 hours. Recent Labs  Lab 07/26/18 1214  AMMONIA 20    ABG    Component Value Date/Time   PHART 7.369 07/27/2018 0427   PCO2ART 57.9 (H) 07/27/2018 0427   PO2ART 78.0 (L) 07/27/2018 0427   HCO3 33.4 (H) 07/27/2018 0427   TCO2 35 (H) 07/27/2018 0427   O2SAT 72.4 07/27/2018 1023     Coagulation Profile: No results for input(s): INR, PROTIME in the last 168 hours.  Cardiac Enzymes: No results for input(s): CKTOTAL, CKMB, CKMBINDEX, TROPONINI in the last 168 hours.  HbA1C: Hgb A1c MFr Bld  Date/Time Value Ref Range Status  07/22/2018 09:55 AM 6.0 (H) 4.8 - 5.6 % Final    Comment:    (NOTE) Pre diabetes:          5.7%-6.4% Diabetes:              >6.4% Glycemic control for   <7.0% adults with diabetes     CBG: Recent Labs  Lab 07/27/18 0456 07/27/18 0815 07/27/18 1136 07/27/18 1608 07/27/18 1952  GLUCAP 199* 142* 172* 132* 140*     Allergies Allergies  Allergen Reactions  . Penicillins     Pt reports it makes him feel shaky Has patient had a PCN reaction causing immediate rash, facial/tongue/throat swelling, SOB or lightheadedness with hypotension: YES Has patient had a PCN reaction causing severe rash involving mucus membranes or skin necrosis: NO Has patient had a PCN reaction that required hospitalization NO Has patient had a PCN reaction occurring within the last 10 years: NO If all of the above answers are "NO", then may proceed with Cephalosporin use.       Critical care time: 45 minutes

## 2018-07-27 NOTE — Plan of Care (Signed)
  Problem: Cardiac: Goal: Ability to achieve and maintain adequate cardiopulmonary perfusion will improve Outcome: Progressing   

## 2018-07-27 NOTE — Progress Notes (Signed)
Lactic acid 1.7 (up from 1.1). Elink notified. No additional orders at this time.

## 2018-07-28 ENCOUNTER — Inpatient Hospital Stay (HOSPITAL_COMMUNITY): Payer: Medicare Other

## 2018-07-28 DIAGNOSIS — I952 Hypotension due to drugs: Secondary | ICD-10-CM

## 2018-07-28 LAB — BASIC METABOLIC PANEL
ANION GAP: 11 (ref 5–15)
BUN: 53 mg/dL — ABNORMAL HIGH (ref 8–23)
CHLORIDE: 99 mmol/L (ref 98–111)
CO2: 29 mmol/L (ref 22–32)
Calcium: 9.5 mg/dL (ref 8.9–10.3)
Creatinine, Ser: 1.33 mg/dL — ABNORMAL HIGH (ref 0.61–1.24)
GFR calc Af Amer: >60 mL/min (ref >60)
GFR calc non Af Amer: 55 mL/min — ABNORMAL LOW (ref >60)
Glucose, Bld: 172 mg/dL — ABNORMAL HIGH (ref 70–99)
Potassium: 3.6 mmol/L (ref 3.5–5.1)
Sodium: 139 mmol/L (ref 135–145)

## 2018-07-28 LAB — POCT I-STAT 7, (LYTES, BLD GAS, ICA,H+H)
Acid-Base Excess: 4 mmol/L — ABNORMAL HIGH (ref 0.0–2.0)
Bicarbonate: 30.4 mmol/L — ABNORMAL HIGH (ref 20.0–28.0)
Calcium, Ion: 1.29 mmol/L (ref 1.15–1.40)
HEMATOCRIT: 50 % (ref 39.0–52.0)
Hemoglobin: 17 g/dL (ref 13.0–17.0)
O2 Saturation: 93 %
Patient temperature: 98.1
Potassium: 3.8 mmol/L (ref 3.5–5.1)
Sodium: 138 mmol/L (ref 135–145)
TCO2: 32 mmol/L (ref 22–32)
pCO2 arterial: 51.3 mmHg — ABNORMAL HIGH (ref 32.0–48.0)
pH, Arterial: 7.38 (ref 7.350–7.450)
pO2, Arterial: 70 mmHg — ABNORMAL LOW (ref 83.0–108.0)

## 2018-07-28 LAB — GLUCOSE, CAPILLARY
GLUCOSE-CAPILLARY: 156 mg/dL — AB (ref 70–99)
Glucose-Capillary: 126 mg/dL — ABNORMAL HIGH (ref 70–99)
Glucose-Capillary: 159 mg/dL — ABNORMAL HIGH (ref 70–99)
Glucose-Capillary: 159 mg/dL — ABNORMAL HIGH (ref 70–99)
Glucose-Capillary: 164 mg/dL — ABNORMAL HIGH (ref 70–99)
Glucose-Capillary: 176 mg/dL — ABNORMAL HIGH (ref 70–99)

## 2018-07-28 LAB — PHOSPHORUS: Phosphorus: 3.6 mg/dL (ref 2.5–4.6)

## 2018-07-28 LAB — CBC WITH DIFFERENTIAL/PLATELET
Abs Immature Granulocytes: 0.11 10*3/uL — ABNORMAL HIGH (ref 0.00–0.07)
Basophils Absolute: 0 10*3/uL (ref 0.0–0.1)
Basophils Relative: 0 %
Eosinophils Absolute: 0 10*3/uL (ref 0.0–0.5)
Eosinophils Relative: 0 %
HCT: 49.2 % (ref 39.0–52.0)
Hemoglobin: 14.6 g/dL (ref 13.0–17.0)
Immature Granulocytes: 1 %
Lymphocytes Relative: 1 %
Lymphs Abs: 0.2 10*3/uL — ABNORMAL LOW (ref 0.7–4.0)
MCH: 26.2 pg (ref 26.0–34.0)
MCHC: 29.7 g/dL — ABNORMAL LOW (ref 30.0–36.0)
MCV: 88.2 fL (ref 80.0–100.0)
Monocytes Absolute: 0.8 10*3/uL (ref 0.1–1.0)
Monocytes Relative: 4 %
NEUTROS PCT: 94 %
Neutro Abs: 17.8 10*3/uL — ABNORMAL HIGH (ref 1.7–7.7)
Platelets: 230 10*3/uL (ref 150–400)
RBC: 5.58 MIL/uL (ref 4.22–5.81)
RDW: 19 % — AB (ref 11.5–15.5)
WBC: 18.9 10*3/uL — ABNORMAL HIGH (ref 4.0–10.5)
nRBC: 0 % (ref 0.0–0.2)

## 2018-07-28 LAB — COOXEMETRY PANEL
Carboxyhemoglobin: 2 % — ABNORMAL HIGH (ref 0.5–1.5)
Methemoglobin: 0.9 % (ref 0.0–1.5)
O2 Saturation: 73.2 %
Total hemoglobin: 15.1 g/dL (ref 12.0–16.0)

## 2018-07-28 LAB — MAGNESIUM: MAGNESIUM: 2.4 mg/dL (ref 1.7–2.4)

## 2018-07-28 MED ORDER — POTASSIUM CHLORIDE 20 MEQ/15ML (10%) PO SOLN
40.0000 meq | Freq: Once | ORAL | Status: AC
  Administered 2018-07-28: 40 meq via ORAL
  Filled 2018-07-28: qty 30

## 2018-07-28 MED ORDER — LEVALBUTEROL HCL 1.25 MG/0.5ML IN NEBU: 1.2500 mg | INHALATION_SOLUTION | Freq: Four times a day (QID) | RESPIRATORY_TRACT | Status: DC

## 2018-07-28 MED ORDER — FUROSEMIDE 10 MG/ML IJ SOLN
80.0000 mg | Freq: Once | INTRAMUSCULAR | Status: AC
  Administered 2018-07-28: 80 mg via INTRAVENOUS
  Filled 2018-07-28: qty 8

## 2018-07-28 MED ORDER — VITAL HIGH PROTEIN PO LIQD: 1000.0000 mL | ORAL | Status: DC

## 2018-07-28 MED ORDER — METHYLPREDNISOLONE SODIUM SUCC 40 MG IJ SOLR
40.0000 mg | INTRAMUSCULAR | Status: AC
  Administered 2018-07-28 – 2018-07-29 (×2): 40 mg via INTRAVENOUS
  Filled 2018-07-28 (×3): qty 1

## 2018-07-28 MED ORDER — IPRATROPIUM BROMIDE 0.02 % IN SOLN
0.5000 mg | Freq: Four times a day (QID) | RESPIRATORY_TRACT | Status: DC
  Administered 2018-07-28 – 2018-07-30 (×10): 0.5 mg via RESPIRATORY_TRACT
  Filled 2018-07-28 (×10): qty 2.5

## 2018-07-28 MED ORDER — LEVALBUTEROL HCL 1.25 MG/0.5ML IN NEBU
1.2500 mg | INHALATION_SOLUTION | Freq: Four times a day (QID) | RESPIRATORY_TRACT | Status: DC
  Administered 2018-07-28 – 2018-07-30 (×10): 1.25 mg via RESPIRATORY_TRACT
  Filled 2018-07-28 (×10): qty 0.5

## 2018-07-28 MED ORDER — SENNOSIDES 8.8 MG/5ML PO SYRP
5.0000 mL | ORAL_SOLUTION | Freq: Every day | ORAL | Status: DC
  Administered 2018-07-28 – 2018-08-02 (×5): 5 mL via ORAL
  Filled 2018-07-28 (×10): qty 5

## 2018-07-28 MED ORDER — METHYLPREDNISOLONE SODIUM SUCC 40 MG IJ SOLR: 40.0000 mg | INTRAMUSCULAR | Status: DC

## 2018-07-28 MED ORDER — IPRATROPIUM BROMIDE 0.02 % IN SOLN: 0.5000 mg | Freq: Four times a day (QID) | RESPIRATORY_TRACT | Status: DC

## 2018-07-28 MED ORDER — VITAL HIGH PROTEIN PO LIQD
1000.0000 mL | ORAL | Status: DC
  Administered 2018-07-29: 1000 mL

## 2018-07-28 NOTE — Progress Notes (Signed)
Initial Nutrition Assessment  DOCUMENTATION CODES:   Obesity unspecified  INTERVENTION:   TF via OG tube: - Vital High Protein @ 50 ml/hr (1200 ml/day) - Pro-stat 30 ml BID - Free water per MD  Tube feeding regimen provides 1400 kcal, 135 grams of protein, and 1008 ml of H2O (100% of needs).  NUTRITION DIAGNOSIS:   Inadequate oral intake related to inability to eat as evidenced by NPO status.  GOAL:   Provide needs based on ASPEN/SCCM guidelines  MONITOR:   Vent status, Weight trends, I & O's, Labs, TF tolerance  REASON FOR ASSESSMENT:   Ventilator, Consult Enteral/tube feeding initiation and management  ASSESSMENT:   68 year old male who presented to the ED on 2/10 with SOB and leg swelling.  PMH significant for CHF, HTN, T2DM, CAD. Pt admitted with CHF exacerbation..  2/14 - transferred to ICU 2/15 - intubated, self-extubated, re-intubated  Noted CHF diet education provided by RN on 2/11. Meal completion 50-100% on 2/11-2/14.  Discussed pt with RN. Pt opened his eyes to RD touch. No family present at time of visit. Per weight history in chart, weight appears fairly stable since 2018. Reviewed RD education note: "Pt shares he often consumes 2 meals per day, which consist of chicken or steak (baked or prepared in air fryer) and black beans."  Weight down 20 lbs since admission. Diuresis continues.  Current TF (ordered on 2/16 per Adult ICU Tube Feeding Protocol): Vital High Protein @ 40 ml/hr, Pro-stat 30 ml BID  Patient is currently intubated on ventilator support MV: 6.9 L/min Temp (24hrs), Avg:98.4 F (36.9 C), Min:98.1 F (36.7 C), Max:98.8 F (37.1 C)  Propofol: none Precedex: 16.1 ml/hr Fentanyl: 2 ml/hr  Medications reviewed and include: Pepcid, SSI q 4 hours, Senokot, IV antibiotics  Labs reviewed: BUN 53 (H), creatinine 1.33 (H) CBG's: 159, 164, 157, 140, 132, 172 x 24 hours  UOP: 4555 ml x 24 hours OGT output: 150 ml x 24 hours I/O's: -7.6 L  since admit  NUTRITION - FOCUSED PHYSICAL EXAM:    Most Recent Value  Orbital Region  No depletion  Upper Arm Region  No depletion  Thoracic and Lumbar Region  No depletion  Buccal Region  No depletion  Temple Region  Mild depletion  Clavicle Bone Region  No depletion  Clavicle and Acromion Bone Region  No depletion  Scapular Bone Region  Unable to assess  Dorsal Hand  No depletion  Patellar Region  No depletion  Anterior Thigh Region  Mild depletion  Posterior Calf Region  No depletion  Edema (RD Assessment)  None  Hair  Reviewed  Eyes  Reviewed  Mouth  Unable to assess  Skin  Reviewed       Diet Order:   Diet Order            Diet NPO time specified  Diet effective now              EDUCATION NEEDS:   Not appropriate for education at this time  Skin:  Skin Assessment: Reviewed RN Assessment  Last BM:  2/10  Height:   Ht Readings from Last 1 Encounters:  07/26/18 5\' 6"  (1.676 m)    Weight:   Wt Readings from Last 1 Encounters:  07/28/18 105.5 kg    Ideal Body Weight:  64.5 kg  BMI:  Body mass index is 37.54 kg/m.  Estimated Nutritional Needs:   Kcal:  1517-6160  Protein:  129-144 grams  Fluid:  per MD  Kate Jablonski Khadejah Son, MS, RD, LDN Inpatient Clinical Dietitian Pager: [phone] Weekend/After Hours: [phone]  

## 2018-07-28 NOTE — Progress Notes (Signed)
-  NAME:  Kristopher [name], MRN:  [id number], DOB:  [birth year], LOS: 7 ADMISSION DATE:  07/21/2018, CONSULTATION DATE:   REFERRING MD:  CHIEF COMPLAINT:  Respiratory Failure  Brief History   68 y/o M admitted 2/10 with progressive SOB and swelling. Had approximately 15kg weight gain.  Known mixed HFREF and diastolic heart function with COPD/Tobacco abuse. Admitted to floor initially, then transferred 2/14 to SDU secondary to hypercapnia and encephalopathy. Started on BiPAP, continued on diuretics including diamox.Steroids discontinued. Felt to be heart failure related  Interim history/subjective:  RT reports pt not weaned yet this am.  CHF service giving diuretics in attempt to wean from vent.   Significant Hospital Events   2/10  Admit  2/14  Tx to SDU > ICU  2/15  Intubated, hypotension with diuresis / vasopressors, AKI, CAP rx. Self extubated / re-intubated 2/16 Intermittent agitation, responded to volume, off pressors.    Past Medical History  Combined Systolic / Diastolic CHF  CAD HTN LBBB Asthma / COPD  Tobacco Abuse  Pulmonary HTN PUD   Consults:  Cardiology / CHF  PCCM   Procedures:  L IJ TLC 2/15 >>  ETT 2/15 >>   Significant Diagnostic Tests:  ECHO 2/11 >> LVEF severely reduced 25-30%, RVSP ~28.6, LA moderately dilated, RA severely dilated, grade I diastolic dysfunction   Micro Data:  RVP 2/15 >> negative  Sputum 2/15 >>   Antimicrobials:  Doxycycline 2/15 >> Azithromycin 2/15 >> 2/17  Objective   Blood pressure (!) 176/71, pulse (!) 106, temperature 98.8 F (37.1 C), temperature source Oral, resp. rate (!) 23, height 5\' 6"  (1.676 m), weight 105.5 kg, SpO2 96 %. CVP:  [12 mmHg-20 mmHg] 12 mmHg  Vent Mode: PRVC FiO2 (%):  [40 %-50 %] 40 % Set Rate:  [16 bmp] 16 bmp Vt Set:  [510 mL] 510 mL PEEP:  [10 cmH20] 10 cmH20 Plateau Pressure:  [20 cmH20-26 cmH20] 26 cmH20   Intake/Output Summary (Last 24 hours) at 07/28/2018 0950 Last data filed at 07/28/2018  0900 Gross per 24 hour  Intake 3072.82 ml  Output 5105 ml  Net -2032.18 ml   Filed Weights   07/26/18 0500 07/27/18 0200 07/28/18 0200  Weight: 107.6 kg 107.4 kg 105.5 kg    Examination: General: obese male lying in bed on vent in NAD HEENT: MM pink/moist, ETT, L IJ TLC c/d/i  Neuro: opens eyes to voice, drifts back to sleep  CV: s1s2 rrr, no m/r/g PULM: even/non-labored, lungs bilaterally coarse, no wheezing  GI: protuberant, non-tender, bsx4 active  Extremities: warm/dry, no edema  Skin: no rashes or lesions  Resolved Hospital Problem list     Assessment & Plan:   Chronic Combined Systolic / Diastolic CHF, Dilated CHF  P: Diuresis per CHF  ICU monitoring  Trend SvO2  Sepsis with Shock  -component of diuresis / hypovolemia P: Empiric abx as above  Follow fever curve / WBC trend  Diuresis per CHF service  Hold home ACE-I, spironolactone  Monitor off vasopressors  MAP goal >65   Acute on Chronic Hypoxemic / Hypercarbic Respiratory Failure  -in setting of CHF, Exacerbation of COPD, +/- PNA AECOPD  ? PNA  -no infiltrate on CXR P: PRVC 8 cc/kg as rest mode  Higher PEEP for chest wall compliance / obesity  Attempt SBT / WUA  Continue empiric abx, discontinue azithro with QT risk  Wean steroids to daily for 2 more days, then discontinue Await sputum culture Continue xopenex + atrovent Q6  Acute Metabolic Encephalopathy  P: Minimize sedating medications as able  Daily WUA / SBT  Continue fentanyl / precedex for RASS Goal of 0 to -1   At Risk Malnutrition  P: Continue TF per Nutrition   Pulmonary HTN P: Plan as above  May need RHC at some point   Constipation  P: PRN regimen  Senna QD, hold for diarrhea     Best practice:  Diet: TF Pain/Anxiety/Delirium protocol (if indicated): Precedex  VAP protocol (if indicated): Ordered including eye gtts DVT prophylaxis: Heparin subq GI prophylaxis: Pepcid 20 mg q12 Glucose control: CBG monitoring   Mobility: As tolerated  Code Status: Full Family Communication: No family at bedside am 2/17 on NP rounds.  Disposition: ICU  Labs   CBC: Recent Labs  Lab 07/21/18 1123 07/24/18 0535 07/26/18 0337  07/26/18 1828 07/27/18 0427 07/27/18 0500 07/28/18 0350 07/28/18 0355  WBC 10.1 12.6* 15.2*  --   --   --  22.3*  --  18.9*  NEUTROABS  --   --  12.9*  --   --   --  21.5*  --  17.8*  HGB 13.7 14.7 14.8   < > 16.7 16.7 14.4 17.0 14.6  HCT 49.9 51.7 51.7   < > 49.0 49.0 50.0 50.0 49.2  MCV 90.7 90.1 91.0  --   --   --  87.9  --  88.2  PLT 380 357 288  --   --   --  213  --  230   < > = values in this interval not displayed.    Basic Metabolic Panel: Recent Labs  Lab 07/24/18 0535 07/25/18 0357 07/26/18 0337  07/26/18 1429  07/26/18 1828 07/27/18 0427 07/27/18 0500 07/27/18 2107 07/28/18 0350 07/28/18 0355  NA 139 138 136   < > 137   < > 136 138 138  --  138 139  K 4.0 4.3 4.4   < > 3.9   < > 4.0 3.9 3.8  --  3.8 3.6  CL 93* 90* 87*  --  88*  --   --   --  96*  --   --  99  CO2 35* 36* 36*  --  32  --   --   --  30  --   --  29  GLUCOSE 108* 149* 130*  --  149*  --   --   --  188*  --   --  172*  BUN 24* 32* 48*  --  53*  --   --   --  50*  --   --  53*  CREATININE 1.43* 1.58* 2.66*  --  2.70*  --   --   --  1.78*  --   --  1.33*  CALCIUM 9.5 9.6 9.2  --  8.6*  --   --   --  9.1  --   --  9.5  MG 2.4  --  2.8*  --   --   --   --   --  2.7* 2.5*  --  2.4  PHOS  --   --   --   --   --   --   --   --  4.5 3.2  --  3.6   < > = values in this interval not displayed.   GFR: Estimated Creatinine Clearance: 61.4 mL/min (A) (by C-G formula based on SCr of 1.33 mg/dL (H)). Recent Labs  Lab 07/24/18 0535 07/26/18  8657 07/26/18 1214 07/26/18 1744 07/26/18 2312 07/27/18 0500 07/28/18 0355  PROCALCITON  --   --  0.12  --   --   --   --   WBC 12.6* 15.2*  --   --   --  22.3* 18.9*  LATICACIDVEN  --   --   --  1.1 1.7  --   --     Liver Function Tests: Recent Labs  Lab  07/21/18 1123  AST 17  ALT 11  ALKPHOS 55  BILITOT 1.3*  PROT 6.4*  ALBUMIN 3.5   No results for input(s): LIPASE, AMYLASE in the last 168 hours. Recent Labs  Lab 07/26/18 1214  AMMONIA 20    ABG    Component Value Date/Time   PHART 7.380 07/28/2018 0350   PCO2ART 51.3 (H) 07/28/2018 0350   PO2ART 70.0 (L) 07/28/2018 0350   HCO3 30.4 (H) 07/28/2018 0350   TCO2 32 07/28/2018 0350   O2SAT 73.2 07/28/2018 0400     Coagulation Profile: No results for input(s): INR, PROTIME in the last 168 hours.  Cardiac Enzymes: No results for input(s): CKTOTAL, CKMB, CKMBINDEX, TROPONINI in the last 168 hours.  HbA1C: Hgb A1c MFr Bld  Date/Time Value Ref Range Status  07/22/2018 09:55 AM 6.0 (H) 4.8 - 5.6 % Final    Comment:    (NOTE) Pre diabetes:          5.7%-6.4% Diabetes:              >6.4% Glycemic control for   <7.0% adults with diabetes     CBG: Recent Labs  Lab 07/27/18 1608 07/27/18 1952 07/27/18 2342 07/28/18 0339 07/28/18 0805  GLUCAP 132* 140* 157* 164* 159*     Allergies Allergies  Allergen Reactions  . Penicillins     Pt reports it makes him feel shaky Has patient had a PCN reaction causing immediate rash, facial/tongue/throat swelling, SOB or lightheadedness with hypotension: YES Has patient had a PCN reaction causing severe rash involving mucus membranes or skin necrosis: NO Has patient had a PCN reaction that required hospitalization NO Has patient had a PCN reaction occurring within the last 10 years: NO If all of the above answers are "NO", then may proceed with Cephalosporin use.       Critical care time: 35 minutes    Canary Brim, NP-C [facility] Pgr: [phone] or if no answer [phone] 07/28/2018, 10:28 AM

## 2018-07-28 NOTE — Progress Notes (Addendum)
Patient ID: Kristopher [name], male   DOB: July 09, 1950, 68 y.o.   MRN: [id number]     Advanced Heart Failure Rounding Note  PCP-Cardiologist: Kristopher Him, MD   Subjective:    Patient intubated 07/26/18 with acute hypercarbic respiratory failure and lethargy/unable to protect airway. Had transient hypotension requiring pressors.   He is on Solumedrol + doxycycline/azithro per CCM for COPD exacerbation.   Creatinine 2.66 => 1.78 => 1.33. CVP 10-11 cm, personally checked. IV fluid stopped. Negative 1.5 L with IV lasix 60 mg BID x 2.   Remains intubated. Follows commands.   Echo 07/22/18: EF 25-30%, global HK, RV normal function, increased wall thickness, LA moderately dilated, RA severely dilated  Objective:   Weight Range: 105.5 kg Body mass index is 37.54 kg/m.   Vital Signs:   Temp:  [98.1 F (36.7 C)-98.8 F (37.1 C)] 98.8 F (37.1 C) (02/17 0755) Pulse Rate:  [56-117] 64 (02/17 0755) Resp:  [4-28] 16 (02/17 0755) BP: (89-153)/(59-90) 138/62 (02/17 0755) SpO2:  [92 %-99 %] 95 % (02/17 0755) Arterial Line BP: (65-198)/(48-107) 127/61 (02/17 0700) FiO2 (%):  [40 %-50 %] 40 % (02/17 0755) Weight:  [105.5 kg] 105.5 kg (02/17 0200) Last BM Date: 07/21/18  Weight change: Filed Weights   07/26/18 0500 07/27/18 0200 07/28/18 0200  Weight: 107.6 kg 107.4 kg 105.5 kg   Intake/Output:   Intake/Output Summary (Last 24 hours) at 07/28/2018 0830 Last data filed at 07/28/2018 0800 Gross per 24 hour  Intake 3124.87 ml  Output 5105 ml  Net -1980.13 ml    Physical Exam    General: Intubated/sedated.  HEENT: + ETT Neck: Supple. JVP difficult, but CVP 10-11. Carotids 2+ bilat; no bruits. No thyromegaly or nodule noted. Cor: PMI nonpalpable. RRR, No M/G/R noted Lungs: Diminished basilar sounds Abdomen: Soft, non-tender, non-distended, no HSM. No bruits or masses. +BS  Extremities: No cyanosis, clubbing, or rash. Trace ankle edema.  Neuro: Intubated/sedated   Telemetry   NSR  60-70s, occasional PVCs, personally reviewed.   EKG    No new tracings.    Labs    CBC Recent Labs    07/27/18 0500 07/28/18 0350 07/28/18 0355  WBC 22.3*  --  18.9*  NEUTROABS 21.5*  --  17.8*  HGB 14.4 17.0 14.6  HCT 50.0 50.0 49.2  MCV 87.9  --  88.2  PLT 213  --  665   Basic Metabolic Panel Recent Labs    07/27/18 0500 07/27/18 2107 07/28/18 0350 07/28/18 0355  NA 138  --  138 139  K 3.8  --  3.8 3.6  CL 96*  --   --  99  CO2 30  --   --  29  GLUCOSE 188*  --   --  172*  BUN 50*  --   --  53*  CREATININE 1.78*  --   --  1.33*  CALCIUM 9.1  --   --  9.5  MG 2.7* 2.5*  --  2.4  PHOS 4.5 3.2  --  3.6   Liver Function Tests No results for input(s): AST, ALT, ALKPHOS, BILITOT, PROT, ALBUMIN in the last 72 hours. No results for input(s): LIPASE, AMYLASE in the last 72 hours. Cardiac Enzymes No results for input(s): CKTOTAL, CKMB, CKMBINDEX, TROPONINI in the last 72 hours.  BNP: BNP (last 3 results) Recent Labs    07/21/18 1123  BNP 964.4*    ProBNP (last 3 results) No results for input(s): PROBNP in the last 8760 hours.  D-Dimer No results for input(s): DDIMER in the last 72 hours. Hemoglobin A1C No results for input(s): HGBA1C in the last 72 hours. Fasting Lipid Panel Recent Labs    07/26/18 1214  TRIG 200*   Thyroid Function Tests No results for input(s): TSH, T4TOTAL, T3FREE, THYROIDAB in the last 72 hours.  Invalid input(s): FREET3  Other results:   Imaging    Dg Chest Port 1 View  Result Date: 07/28/2018 CLINICAL DATA:  Endotracheal tube in respiratory failure EXAM: PORTABLE CHEST 1 VIEW COMPARISON:  Yesterday FINDINGS: Endotracheal tube tip at the clavicular heads. Left IJ line with tip at the brachiocephalic SVC origin. The orogastric tube at least reaches the stomach. Chronic cardiopericardial enlargement. Chronic low lung volumes with interstitial opacity at the bases. IMPRESSION: 1. Unremarkable hardware positioning. 2.  Persistent low lung volumes.  Chronic cardiomegaly. Electronically Signed   By: Kristopher [name] M.D.   On: 07/28/2018 07:19     Medications:     Scheduled Medications: . albuterol  2.5 mg Nebulization Q4H  . aspirin  81 mg Oral Daily  . chlorhexidine gluconate (MEDLINE KIT)  15 mL Mouth Rinse BID  . Chlorhexidine Gluconate Cloth  6 each Topical Daily  . eye wash  2 drop Both Eyes Q6H  . famotidine  20 mg Oral BID  . feeding supplement (PRO-STAT SUGAR FREE 64)  30 mL Per Tube BID  . feeding supplement (VITAL HIGH PROTEIN)  1,000 mL Per Tube Q24H  . heparin  5,000 Units Subcutaneous Q8H  . insulin aspart  0-15 Units Subcutaneous Q4H  . mouth rinse  15 mL Mouth Rinse 10 times per day  . methylPREDNISolone (SOLU-MEDROL) injection  40 mg Intravenous Q12H  . sodium chloride flush  10-40 mL Intracatheter Q12H  . sodium chloride flush  3 mL Intravenous Q12H    Infusions: . sodium chloride    . sodium chloride    . azithromycin Stopped (07/27/18 1417)  . dexmedetomidine 1.2 mcg/kg/hr (07/28/18 0700)  . doxycycline (VIBRAMYCIN) IV Stopped (07/28/18 0521)  . fentaNYL infusion INTRAVENOUS 400 mcg/hr (07/28/18 0700)  . norepinephrine (LEVOPHED) Adult infusion Stopped (07/27/18 2300)  . propofol (DIPRIVAN) infusion Stopped (07/27/18 0207)    PRN Medications: sodium chloride, Place/Maintain arterial line **AND** sodium chloride, acetaminophen, docusate, fentaNYL, fentaNYL (SUBLIMAZE) injection, fentaNYL (SUBLIMAZE) injection, guaiFENesin-dextromethorphan, levalbuterol, ondansetron (ZOFRAN) IV, sodium chloride flush, sodium chloride flush    Patient Profile   Mr Kristopher [name] is a 68 year old with history of obesity, HTN, DM, asthma and chronic diastolic/systolic heart failure due to NICM with EF 45%.   Assessment/Plan  1. Acute on chronic systolic/diastolic HF - Echo 5/88/50 EF 45%. Cath with minimal CAD - Echo 07/22/18: EF 25-30%, global HK, RV normal function, increased wall thickness, LA  moderately dilated, RA severely dilated - CVP 10-11. IVF stopped yesterday. Will give 1 dose 80 mg IV lasix to help facilitate extubation.  - Creatinine continues to improve.  - Off Entresto and spironolactone with AKI and low BP.  - Coox 73.2%.    2. Acute hypoxic respiratory failure - Likely combination of HF and AECOPD. CXR today with bibasilar atelectasis.  - Remains intubated. Treating AECOPD with doxycycline + azithromycin and Solumedrol => per CCM.    3. Undiagnosed OSA - Will need outpatient sleep study.Will likely need to set [name] up with BiPAP prior to DC. No change.   4. DM2 - Per primary team - Consider SGLT2i at d/c. No change.   5. AKI - Resolving, creatinine down  to 1.33  Shirley Friar, PA-C  8:30 AM  Advanced Heart Failure Team Pager [phone] (M-F; 7a - 4p)  Please contact [facility] for night-coverage after hours (4p -7a ) and weekends on [email or web address]   Agree with above   Remains intubated. Diuresed yesterday. CVP now 11-12. Co-ox ok at 73%. Renal function improved. PCO2 down to 51 on ABG which is well compensated.   On vent CVP to jaw Cor RRR Lungs coarse Ab obese NT Ext warm no edema  Improving with diuresis and treatment of COPD flare. Co-ox ok. Will give 1 dose of IV lasix today to help facilitate vent wean. Co-ox ok so no need for inotropes. Will need RHC later this week.   CRITICAL CARE Performed by: Glori Bickers  Total critical care time: 35 minutes  Critical care time was exclusive of separately billable procedures and treating other patients.  Critical care was necessary to treat or prevent imminent or life-threatening deterioration.  Critical care was time spent personally by me (independent of midlevel providers or residents) on the following activities: development of treatment plan with patient and/or surrogate as well as nursing, discussions with consultants, evaluation of patient's response to treatment, examination  of patient, obtaining history from patient or surrogate, ordering and performing treatments and interventions, ordering and review of laboratory studies, ordering and review of radiographic studies, pulse oximetry and re-evaluation of patient's condition.  Glori Bickers, MD  9:05 AM

## 2018-07-28 NOTE — Progress Notes (Signed)
Unable to obtain blood return from distal port of CVC.  Co-ox drawn from medial port.

## 2018-07-29 ENCOUNTER — Inpatient Hospital Stay (HOSPITAL_COMMUNITY): Payer: Medicare Other

## 2018-07-29 LAB — BASIC METABOLIC PANEL
Anion gap: 9 (ref 5–15)
BUN: 57 mg/dL — ABNORMAL HIGH (ref 8–23)
CALCIUM: 9.8 mg/dL (ref 8.9–10.3)
CO2: 30 mmol/L (ref 22–32)
Chloride: 103 mmol/L (ref 98–111)
Creatinine, Ser: 1.26 mg/dL — ABNORMAL HIGH (ref 0.61–1.24)
GFR calc Af Amer: >60 mL/min (ref >60)
GFR calc non Af Amer: 59 mL/min — ABNORMAL LOW (ref >60)
Glucose, Bld: 135 mg/dL — ABNORMAL HIGH (ref 70–99)
Potassium: 4 mmol/L (ref 3.5–5.1)
Sodium: 142 mmol/L (ref 135–145)

## 2018-07-29 LAB — GLUCOSE, CAPILLARY
Glucose-Capillary: 109 mg/dL — ABNORMAL HIGH (ref 70–99)
Glucose-Capillary: 125 mg/dL — ABNORMAL HIGH (ref 70–99)
Glucose-Capillary: 140 mg/dL — ABNORMAL HIGH (ref 70–99)
Glucose-Capillary: 179 mg/dL — ABNORMAL HIGH (ref 70–99)
Glucose-Capillary: 152 mg/dL — ABNORMAL HIGH (ref 70–99)

## 2018-07-29 LAB — CBC WITH DIFFERENTIAL/PLATELET
Abs Immature Granulocytes: 0.06 10*3/uL (ref 0.00–0.07)
Basophils Absolute: 0 10*3/uL (ref 0.0–0.1)
Basophils Relative: 0 %
EOS ABS: 0 10*3/uL (ref 0.0–0.5)
Eosinophils Relative: 0 %
HCT: 49.9 % (ref 39.0–52.0)
Hemoglobin: 14.4 g/dL (ref 13.0–17.0)
IMMATURE GRANULOCYTES: 0 %
Lymphocytes Relative: 4 %
Lymphs Abs: 0.7 10*3/uL (ref 0.7–4.0)
MCH: 25.9 pg — ABNORMAL LOW (ref 26.0–34.0)
MCHC: 28.9 g/dL — ABNORMAL LOW (ref 30.0–36.0)
MCV: 89.6 fL (ref 80.0–100.0)
Monocytes Absolute: 1.5 10*3/uL — ABNORMAL HIGH (ref 0.1–1.0)
Monocytes Relative: 9 %
Neutro Abs: 14.5 10*3/uL — ABNORMAL HIGH (ref 1.7–7.7)
Neutrophils Relative %: 87 %
Platelets: 238 10*3/uL (ref 150–400)
RBC: 5.57 MIL/uL (ref 4.22–5.81)
RDW: 19.7 % — AB (ref 11.5–15.5)
WBC: 16.7 10*3/uL — ABNORMAL HIGH (ref 4.0–10.5)
nRBC: 0 % (ref 0.0–0.2)

## 2018-07-29 LAB — CULTURE, RESPIRATORY W GRAM STAIN: Special Requests: NORMAL

## 2018-07-29 LAB — MAGNESIUM: Magnesium: 2.3 mg/dL (ref 1.7–2.4)

## 2018-07-29 LAB — COOXEMETRY PANEL
CARBOXYHEMOGLOBIN: 1.6 % — AB (ref 0.5–1.5)
Methemoglobin: 1.4 % (ref 0.0–1.5)
O2 Saturation: 62.1 %
Total hemoglobin: 15.2 g/dL (ref 12.0–16.0)

## 2018-07-29 LAB — PHOSPHORUS: Phosphorus: 4.2 mg/dL (ref 2.5–4.6)

## 2018-07-29 MED ORDER — SODIUM CHLORIDE 0.9% FLUSH
10.0000 mL | Freq: Two times a day (BID) | INTRAVENOUS | Status: DC
  Administered 2018-07-29: 20 mL
  Administered 2018-07-29 – 2018-08-05 (×12): 10 mL

## 2018-07-29 MED ORDER — BISACODYL 10 MG RE SUPP
10.0000 mg | Freq: Every day | RECTAL | Status: DC | PRN
  Administered 2018-07-30: 10 mg via RECTAL
  Filled 2018-07-29 (×2): qty 1

## 2018-07-29 MED ORDER — FUROSEMIDE 10 MG/ML IJ SOLN
80.0000 mg | Freq: Once | INTRAMUSCULAR | Status: AC
  Administered 2018-07-29: 80 mg via INTRAVENOUS
  Filled 2018-07-29: qty 8

## 2018-07-29 MED ORDER — SODIUM CHLORIDE 0.9% FLUSH: 10.0000 mL | INTRAVENOUS | Status: DC | PRN

## 2018-07-29 MED ORDER — FUROSEMIDE 10 MG/ML IJ SOLN: 80.0000 mg | Freq: Two times a day (BID) | INTRAMUSCULAR | Status: DC

## 2018-07-29 NOTE — Progress Notes (Addendum)
Patient ID: Kristopher [name], male   DOB: 23-Apr-1951, 68 y.o.   MRN: [id number]     Advanced Heart Failure Rounding Note  PCP-Cardiologist: Kristopher Him, MD   Subjective:    Patient intubated 07/26/18 with acute hypercarbic respiratory failure and lethargy/unable to protect airway. Had transient hypotension requiring pressors.   He is on Solumedrol + doxycycline/azithro per CCM for COPD exacerbation.   Creatinine 2.66 => 1.78 => 1.33 => 1.26. CVP improved to 7-8 cm. Negative 1.1L though weight shows up 3 lbs. (Bed weights)  Fentanyl off. Awake on vent and following commands, but intermittently agitated. Son present in room.   CXR this am with stable hardware and stable low volume chest with presumed atelectasis at the bases.   Echo 07/22/18: EF 25-30%, global HK, RV normal function, increased wall thickness, LA moderately dilated, RA severely dilated  Objective:   Weight Range: 107 kg Body mass index is 38.07 kg/m.   Vital Signs:   Temp:  [98.7 F (37.1 C)-100 F (37.8 C)] 98.8 F (37.1 C) (02/18 0300) Pulse Rate:  [61-107] 99 (02/18 0827) Resp:  [11-25] 20 (02/18 0827) BP: (86-200)/(53-97) 128/91 (02/18 0827) SpO2:  [90 %-98 %] 96 % (02/18 0827) Arterial Line BP: (87-228)/(47-106) 92/55 (02/17 1200) FiO2 (%):  [40 %-50 %] 40 % (02/18 0827) Weight:  [107 kg] 107 kg (02/18 0600) Last BM Date: 07/21/18  Weight change: Filed Weights   07/27/18 0200 07/28/18 0200 07/29/18 0600  Weight: 107.4 kg 105.5 kg 107 kg   Intake/Output:   Intake/Output Summary (Last 24 hours) at 07/29/2018 0843 Last data filed at 07/29/2018 0800 Gross per 24 hour  Intake 2563.03 ml  Output 3160 ml  Net -596.97 ml    Physical Exam    General: Intubated/sedated.  HEENT: + ETT Neck: Supple. JVP difficult, CVP 7-8 cm. Carotids 2+ bilat; no bruits. No thyromegaly or nodule noted. Cor: PMI nondisplaced. RRR, No M/G/R noted Lungs: Diminished basilar sounds Abdomen: Soft, non-tender, non-distended, no  HSM. No bruits or masses. +BS  Extremities: No cyanosis, clubbing, or rash. Trace ankle edema.  Neuro: Intubated/sedated   Telemetry   NSR 80-90s mostly, but with occasional PVCs and NSVT, personally reviewed.   EKG    No new tracings.    Labs    CBC Recent Labs    07/28/18 0355 07/29/18 0335  WBC 18.9* 16.7*  NEUTROABS 17.8* 14.5*  HGB 14.6 14.4  HCT 49.2 49.9  MCV 88.2 89.6  PLT 230 765   Basic Metabolic Panel Recent Labs    07/28/18 0355 07/29/18 0335  NA 139 142  K 3.6 4.0  CL 99 103  CO2 29 30  GLUCOSE 172* 135*  BUN 53* 57*  CREATININE 1.33* 1.26*  CALCIUM 9.5 9.8  MG 2.4 2.3  PHOS 3.6 4.2   Liver Function Tests No results for input(s): AST, ALT, ALKPHOS, BILITOT, PROT, ALBUMIN in the last 72 hours. No results for input(s): LIPASE, AMYLASE in the last 72 hours. Cardiac Enzymes No results for input(s): CKTOTAL, CKMB, CKMBINDEX, TROPONINI in the last 72 hours.  BNP: BNP (last 3 results) Recent Labs    07/21/18 1123  BNP 964.4*   ProBNP (last 3 results) No results for input(s): PROBNP in the last 8760 hours.  D-Dimer No results for input(s): DDIMER in the last 72 hours. Hemoglobin A1C No results for input(s): HGBA1C in the last 72 hours. Fasting Lipid Panel Recent Labs    07/26/18 1214  TRIG 200*   Thyroid Function  Tests No results for input(s): TSH, T4TOTAL, T3FREE, THYROIDAB in the last 72 hours.  Invalid input(s): FREET3  Other results:  Imaging    Dg Chest Port 1 View  Result Date: 07/29/2018 CLINICAL DATA:  Respiratory failure EXAM: PORTABLE CHEST 1 VIEW COMPARISON:  Yesterday FINDINGS: Endotracheal tube tip just below the clavicular heads. Left IJ line with tip at the distal left brachiocephalic. The orogastric tube at least reaches the stomach. Low volume chest with hazy opacity at the bases. No Kerley lines. No effusion or pneumothorax. Cardiomegaly. IMPRESSION: 1. Stable hardware positioning. 2. Stable low volume chest with  presumed atelectasis at the bases. Electronically Signed   By: Kristopher [name] M.D.   On: 07/29/2018 08:00    Medications:     Scheduled Medications: . aspirin  81 mg Oral Daily  . chlorhexidine gluconate (MEDLINE KIT)  15 mL Mouth Rinse BID  . Chlorhexidine Gluconate Cloth  6 each Topical Daily  . eye wash  2 drop Both Eyes Q6H  . famotidine  20 mg Oral BID  . feeding supplement (PRO-STAT SUGAR FREE 64)  30 mL Per Tube BID  . heparin  5,000 Units Subcutaneous Q8H  . insulin aspart  0-15 Units Subcutaneous Q4H  . ipratropium  0.5 mg Nebulization Q6H  . levalbuterol  1.25 mg Nebulization Q6H  . mouth rinse  15 mL Mouth Rinse 10 times per day  . methylPREDNISolone (SOLU-MEDROL) injection  40 mg Intravenous Q24H  . sennosides  5 mL Oral QHS  . sodium chloride flush  3 mL Intravenous Q12H    Infusions: . sodium chloride    . sodium chloride    . dexmedetomidine 0.3 mcg/kg/hr (07/29/18 0800)  . doxycycline (VIBRAMYCIN) IV Stopped (07/29/18 0548)  . feeding supplement (VITAL HIGH PROTEIN) 50 mL/hr at 07/28/18 1151  . fentaNYL infusion INTRAVENOUS 200 mcg/hr (07/29/18 0432)  . norepinephrine (LEVOPHED) Adult infusion Stopped (07/27/18 2300)    PRN Medications: sodium chloride, Place/Maintain arterial line **AND** sodium chloride, acetaminophen, docusate, fentaNYL, fentaNYL (SUBLIMAZE) injection, guaiFENesin-dextromethorphan, levalbuterol, ondansetron (ZOFRAN) IV, sodium chloride flush  Patient Profile   Kristopher [name] is a 68 year old with history of obesity, HTN, DM, asthma and chronic diastolic/systolic heart failure due to NICM with EF 45%.   Assessment/Plan  1. Acute on chronic systolic/diastolic HF - Echo 7/53/00 EF 45%. Cath with minimal CAD - Echo 07/22/18: EF 25-30%, global HK, RV normal function, increased wall thickness, LA moderately dilated, RA severely dilated - CVP 7-8 cm. Repeat lasix 80 mg IV x 1 to help facilitate extubation.  - Creatinine continues to improve.  -  Off Entresto and spironolactone with AKI and low BP.  - Coox 62.1%.     2. Acute hypoxic respiratory failure - Likely combination of HF and AECOPD. CXR today with low volume chest with presumed atelectasis at the bases.  - Remains intubated. FiO2 50%, PEEP 8. Treating AECOPD with doxycycline + azithromycin and Solumedrol => per CCM.    3. Undiagnosed OSA - Will need outpatient sleep study.Will likely need to set [name] up with BiPAP prior to DC. No change.   4. DM2 - Per primary team - Consider SGLT2i at d/c. No change.    5. AKI - Resolving, creatinine down to 1.26.  Shirley Friar, PA-C  8:43 AM  Advanced Heart Failure Team Pager [phone] (M-F; 7a - 4p)  Please contact [facility] for night-coverage after hours (4p -7a ) and weekends on [email or web address]  Agree with above.   Remains  on vent. Weaning and awake. CVP climbing slightly 7-8. Co-ox ok. Creatinine improving, ABG much improved  Awake on vent +ETT JVP hard to see Cor RRR Lungs coarse Ab markedly obese Ext warm no edema  Remains intubated. Now weaning. Hopefully can extubate today. Wil lgive one dose IV lasix to help facilitate extubation. Based on co-ox and CVP, main issue seems to be OHS/OSA and not HF but I do think he needs RHC prior to d/c to clearly assess hemodynamics.   CRITICAL CARE Performed by: Glori Bickers  Total critical care time: 35 minutes  Critical care time was exclusive of separately billable procedures and treating other patients.  Critical care was necessary to treat or prevent imminent or life-threatening deterioration.  Critical care was time spent personally by me (independent of midlevel providers or residents) on the following activities: development of treatment plan with patient and/or surrogate as well as nursing, discussions with consultants, evaluation of patient's response to treatment, examination of patient, obtaining history from patient or surrogate, ordering and  performing treatments and interventions, ordering and review of laboratory studies, ordering and review of radiographic studies, pulse oximetry and re-evaluation of patient's condition.   Glori Bickers, MD  11:31 AM

## 2018-07-29 NOTE — Progress Notes (Signed)
-  NAME:  Kristopher [name], MRN:  [id number], DOB:  May 02, 1951, LOS: 8 ADMISSION DATE:  07/21/2018, CONSULTATION DATE:   REFERRING MD:  CHIEF COMPLAINT:  Respiratory Failure  Brief History   68 y/o M admitted 2/10 with progressive SOB and swelling. Had approximately 15kg weight gain.  Known mixed HFREF and diastolic heart function with COPD/Tobacco abuse. Admitted to floor initially, then transferred 2/14 to SDU secondary to hypercapnia and encephalopathy. Started on BiPAP, continued on diuretics including diamox.Steroids discontinued. Felt to be heart failure related  Interim history/subjective:  Weaning on 50%, 15/8 Net negative 9L  Significant Hospital Events   2/10  Admit  2/14  Tx to SDU > ICU  2/15  Intubated, hypotension with diuresis / vasopressors, AKI, CAP rx. Self extubated / re-intubated 2/16 Intermittent agitation, responded to volume, off pressors.    Past Medical History  Combined Systolic / Diastolic CHF  CAD HTN LBBB Asthma / COPD  Tobacco Abuse  Pulmonary HTN PUD   Consults:  Cardiology / CHF  PCCM   Procedures:  L IJ TLC 2/15 >>  ETT 2/15 >>   Significant Diagnostic Tests:  ECHO 2/11 >> LVEF severely reduced 25-30%, RVSP ~28.6, LA moderately dilated, RA severely dilated, grade I diastolic dysfunction   Micro Data:  RVP 2/15 >> negative  Sputum 2/15 >>   Antimicrobials:  Doxycycline 2/15 >> Azithromycin 2/15 >> 2/17  Objective   Blood pressure 117/71, pulse 91, temperature 98.8 F (37.1 C), temperature source Oral, resp. rate 20, height 5\' 6"  (1.676 m), weight 107 kg, SpO2 96 %. CVP:  [6 mmHg-11 mmHg] 6 mmHg  Vent Mode: PSV;CPAP FiO2 (%):  [40 %-50 %] 40 % Set Rate:  [16 bmp] 16 bmp Vt Set:  [510 mL] 510 mL PEEP:  [8 cmH20] 8 cmH20 Pressure Support:  [14 cmH20-15 cmH20] 15 cmH20 Plateau Pressure:  [20 cmH20-21 cmH20] 21 cmH20   Intake/Output Summary (Last 24 hours) at 07/29/2018 1033 Last data filed at 07/29/2018 1000 Gross per 24 hour   Intake 2444.26 ml  Output 3910 ml  Net -1465.74 ml   Filed Weights   07/27/18 0200 07/28/18 0200 07/29/18 0600  Weight: 107.4 kg 105.5 kg 107 kg    Examination: General: obese male lying in bed on vent in NAD, weaning on 50%, 15/8 HEENT: NCAT, pink/moist, oral ETT, L IJ TLC c/d/i  Neuro: opens eyes to , touch, drifts back to sleep  CV: s1s2 rrr, no m/r/g PULM: Bilateral chest excursion noted, even/non-labored, lungs bilaterally coarse, no wheezing  GI: protuberant, non-tender, bsx4 active  Extremities: warm/dry, no edema, no obvious deformities  Skin: no rashes or lesions  Resolved Hospital Problem list     Assessment & Plan:   Chronic Combined Systolic / Diastolic CHF, Dilated CHF  P: Diuresis per CHF  ICU monitoring  Trend SvO2  Sepsis with Shock  -component of diuresis / hypovolemia - T max 100 - Leukocytosis  P: Empiric abx as above  Follow fever curve / WBC trend  Culture as is clinically indicated Diuresis per CHF service  Hold home ACE-I, spironolactone  Monitor off vasopressors  MAP goal >65  PCT prn  Acute on Chronic Hypoxemic / Hypercarbic Respiratory Failure  -in setting of CHF, Exacerbation of COPD, +/- PNA AECOPD  ? PNA  -no infiltrate on CXR>> low volume ? Atelectasis Sputum culture with moderate diphtheroids (CORYNEBACTERIUM SPECIES) Weaning on 50% 15/8 on 2/18 P: Saturation goals are 88-92% PRVC 8 cc/kg as rest mode  Higher  PEEP for chest wall compliance / obesity  Continue  SBT / WUA  Continue empiric abx, discontinue azithro with QT risk  Continue Doxy Wean steroids to daily for 1 more day, then discontinue on 2/19 Continue xopenex + atrovent Q 6  Acute Metabolic Encephalopathy  P: Minimize sedating medications as able  Daily WUA / SBT  Continue fentanyl / precedex for RASS Goal of 0 to -1  Frequent re-orientation Lights on during the day and off at night  At Risk Malnutrition  P: Continue TF per Nutrition   Pulmonary  HTN P: Plan as above  May need RHC at some point   Constipation  Slightly distended abdomen P: PRN regimen  Senna QD, hold for diarrhea  KUB prn    Best practice:  Diet: TF Pain/Anxiety/Delirium protocol (if indicated): Precedex  VAP protocol (if indicated): Ordered including eye gtts DVT prophylaxis: Heparin subq GI prophylaxis: Pepcid 20 mg q12 Glucose control: CBG monitoring  Mobility: As tolerated  Code Status: Full Family Communication: No family at bedside am 2/18 on NP rounds.  Disposition: ICU  Labs   CBC: Recent Labs  Lab 07/24/18 0535 07/26/18 0337  07/27/18 0427 07/27/18 0500 07/28/18 0350 07/28/18 0355 07/29/18 0335  WBC 12.6* 15.2*  --   --  22.3*  --  18.9* 16.7*  NEUTROABS  --  12.9*  --   --  21.5*  --  17.8* 14.5*  HGB 14.7 14.8   < > 16.7 14.4 17.0 14.6 14.4  HCT 51.7 51.7   < > 49.0 50.0 50.0 49.2 49.9  MCV 90.1 91.0  --   --  87.9  --  88.2 89.6  PLT 357 288  --   --  213  --  230 238   < > = values in this interval not displayed.    Basic Metabolic Panel: Recent Labs  Lab 07/26/18 0337  07/26/18 1429  07/27/18 0427 07/27/18 0500 07/27/18 2107 07/28/18 0350 07/28/18 0355 07/29/18 0335  NA 136   < > 137   < > 138 138  --  138 139 142  K 4.4   < > 3.9   < > 3.9 3.8  --  3.8 3.6 4.0  CL 87*  --  88*  --   --  96*  --   --  99 103  CO2 36*  --  32  --   --  30  --   --  29 30  GLUCOSE 130*  --  149*  --   --  188*  --   --  172* 135*  BUN 48*  --  53*  --   --  50*  --   --  53* 57*  CREATININE 2.66*  --  2.70*  --   --  1.78*  --   --  1.33* 1.26*  CALCIUM 9.2  --  8.6*  --   --  9.1  --   --  9.5 9.8  MG 2.8*  --   --   --   --  2.7* 2.5*  --  2.4 2.3  PHOS  --   --   --   --   --  4.5 3.2  --  3.6 4.2   < > = values in this interval not displayed.   GFR: Estimated Creatinine Clearance: 65.3 mL/min (A) (by C-G formula based on SCr of 1.26 mg/dL (H)). Recent Labs  Lab 07/26/18 0337 07/26/18 1214 07/26/18 1744 07/26/18  2312  07/27/18 0500 07/28/18 0355 07/29/18 0335  PROCALCITON  --  0.12  --   --   --   --   --   WBC 15.2*  --   --   --  22.3* 18.9* 16.7*  LATICACIDVEN  --   --  1.1 1.7  --   --   --     Liver Function Tests: No results for input(s): AST, ALT, ALKPHOS, BILITOT, PROT, ALBUMIN in the last 168 hours. No results for input(s): LIPASE, AMYLASE in the last 168 hours. Recent Labs  Lab 07/26/18 1214  AMMONIA 20    ABG    Component Value Date/Time   PHART 7.380 07/28/2018 0350   PCO2ART 51.3 (H) 07/28/2018 0350   PO2ART 70.0 (L) 07/28/2018 0350   HCO3 30.4 (H) 07/28/2018 0350   TCO2 32 07/28/2018 0350   O2SAT 62.1 07/29/2018 0350     Coagulation Profile: No results for input(s): INR, PROTIME in the last 168 hours.  Cardiac Enzymes: No results for input(s): CKTOTAL, CKMB, CKMBINDEX, TROPONINI in the last 168 hours.  HbA1C: Hgb A1c MFr Bld  Date/Time Value Ref Range Status  07/22/2018 09:55 AM 6.0 (H) 4.8 - 5.6 % Final    Comment:    (NOTE) Pre diabetes:          5.7%-6.4% Diabetes:              >6.4% Glycemic control for   <7.0% adults with diabetes     CBG: Recent Labs  Lab 07/28/18 1626 07/28/18 2029 07/28/18 2352 07/29/18 0342 07/29/18 0837  GLUCAP 176* 156* 126* 109* 125*     Allergies Allergies  Allergen Reactions  . Penicillins     Pt reports it makes him feel shaky Has patient had a PCN reaction causing immediate rash, facial/tongue/throat swelling, SOB or lightheadedness with hypotension: YES Has patient had a PCN reaction causing severe rash involving mucus membranes or skin necrosis: NO Has patient had a PCN reaction that required hospitalization NO Has patient had a PCN reaction occurring within the last 10 years: NO If all of the above answers are "NO", then may proceed with Cephalosporin use.       Critical care time: 58 minutes   Bevelyn Ngo, AGACNP-BC [facility] Pulmonary/Critical Care Medicine Pager # [phone] After 3 pm call  [phone] 07/29/2018, 10:33 AM

## 2018-07-29 NOTE — Progress Notes (Signed)
eLink Physician-Brief Progress Note Patient Name: Kristopher [name] DOB: 04-14-1951 MRN: [id number]   Date of Service  07/29/2018  HPI/Events of Note  Agitation - Request to renew orders for restraint.   eICU Interventions  Will renew restraint orders.      Intervention Category Major Interventions: Delirium, psychosis, severe agitation - evaluation and management  [place] Fedora Eugene 07/29/2018, 10:21 PM

## 2018-07-30 ENCOUNTER — Inpatient Hospital Stay (HOSPITAL_COMMUNITY): Payer: Medicare Other

## 2018-07-30 LAB — COMPREHENSIVE METABOLIC PANEL
ALT: 13 U/L (ref 0–44)
AST: 22 U/L (ref 15–41)
Albumin: 3.1 g/dL — ABNORMAL LOW (ref 3.5–5.0)
Alkaline Phosphatase: 42 U/L (ref 38–126)
Anion gap: 9 (ref 5–15)
BUN: 77 mg/dL — ABNORMAL HIGH (ref 8–23)
CO2: 31 mmol/L (ref 22–32)
Calcium: 9.7 mg/dL (ref 8.9–10.3)
Chloride: 105 mmol/L (ref 98–111)
Creatinine, Ser: 1.44 mg/dL — ABNORMAL HIGH (ref 0.61–1.24)
GFR calc Af Amer: 58 mL/min — ABNORMAL LOW (ref >60)
GFR calc non Af Amer: 50 mL/min — ABNORMAL LOW (ref >60)
Glucose, Bld: 118 mg/dL — ABNORMAL HIGH (ref 70–99)
Potassium: 3.9 mmol/L (ref 3.5–5.1)
Sodium: 145 mmol/L (ref 135–145)
Total Bilirubin: 1.7 mg/dL — ABNORMAL HIGH (ref 0.3–1.2)
Total Protein: 6.2 g/dL — ABNORMAL LOW (ref 6.5–8.1)

## 2018-07-30 LAB — CBC WITH DIFFERENTIAL/PLATELET
Abs Immature Granulocytes: 0 10*3/uL (ref 0.00–0.07)
Basophils Absolute: 0 10*3/uL (ref 0.0–0.1)
Basophils Relative: 0 %
Eosinophils Absolute: 0 10*3/uL (ref 0.0–0.5)
Eosinophils Relative: 0 %
HCT: 52.2 % — ABNORMAL HIGH (ref 39.0–52.0)
Hemoglobin: 14.8 g/dL (ref 13.0–17.0)
Lymphocytes Relative: 5 %
Lymphs Abs: 0.9 10*3/uL (ref 0.7–4.0)
MCH: 25.7 pg — ABNORMAL LOW (ref 26.0–34.0)
MCHC: 28.4 g/dL — ABNORMAL LOW (ref 30.0–36.0)
MCV: 90.6 fL (ref 80.0–100.0)
Monocytes Absolute: 1.7 10*3/uL — ABNORMAL HIGH (ref 0.1–1.0)
Monocytes Relative: 10 %
Neutro Abs: 14.5 10*3/uL — ABNORMAL HIGH (ref 1.7–7.7)
Neutrophils Relative %: 85 %
Platelets: 230 10*3/uL (ref 150–400)
RBC: 5.76 MIL/uL (ref 4.22–5.81)
RDW: 20 % — ABNORMAL HIGH (ref 11.5–15.5)
WBC: 17.1 10*3/uL — ABNORMAL HIGH (ref 4.0–10.5)
nRBC: 0 % (ref 0.0–0.2)
nRBC: 0 /100 WBC

## 2018-07-30 LAB — PHOSPHORUS: Phosphorus: 5.1 mg/dL — ABNORMAL HIGH (ref 2.5–4.6)

## 2018-07-30 LAB — GLUCOSE, CAPILLARY
GLUCOSE-CAPILLARY: 144 mg/dL — AB (ref 70–99)
Glucose-Capillary: 107 mg/dL — ABNORMAL HIGH (ref 70–99)
Glucose-Capillary: 113 mg/dL — ABNORMAL HIGH (ref 70–99)
Glucose-Capillary: 122 mg/dL — ABNORMAL HIGH (ref 70–99)
Glucose-Capillary: 131 mg/dL — ABNORMAL HIGH (ref 70–99)
Glucose-Capillary: 131 mg/dL — ABNORMAL HIGH (ref 70–99)
Glucose-Capillary: 137 mg/dL — ABNORMAL HIGH (ref 70–99)

## 2018-07-30 LAB — COOXEMETRY PANEL
Carboxyhemoglobin: 1.6 % — ABNORMAL HIGH (ref 0.5–1.5)
Methemoglobin: 1.5 % (ref 0.0–1.5)
O2 Saturation: 57 %
Total hemoglobin: 15.4 g/dL (ref 12.0–16.0)

## 2018-07-30 LAB — MAGNESIUM: Magnesium: 2.2 mg/dL (ref 1.7–2.4)

## 2018-07-30 MED ORDER — SPIRONOLACTONE 12.5 MG HALF TABLET
12.5000 mg | ORAL_TABLET | Freq: Every day | ORAL | Status: DC
  Administered 2018-07-31 – 2018-08-07 (×8): 12.5 mg via ORAL
  Filled 2018-07-30 (×8): qty 1

## 2018-07-30 MED ORDER — HYDROMORPHONE HCL 1 MG/ML IJ SOLN
INTRAMUSCULAR | Status: AC
  Administered 2018-07-30: 0.4 mg via INTRAVENOUS
  Filled 2018-07-30: qty 0.5

## 2018-07-30 MED ORDER — HYDROMORPHONE HCL 1 MG/ML IJ SOLN
0.4000 mg | Freq: Four times a day (QID) | INTRAMUSCULAR | Status: DC | PRN
  Administered 2018-07-30: 0.4 mg via INTRAVENOUS

## 2018-07-30 NOTE — Procedures (Signed)
Extubation Procedure Note  Patient Details:   Name: WENDEL DECANDIA DOB: 1951/04/23 MRN: [id number]   Airway Documentation:     Evaluation  O2 sats: stable throughout Complications: No apparent complications Patient did tolerate procedure well. Bilateral Breath Sounds: Diminished(coarse)   Pt extubated per MD order.  Pt had +cuff leak, strong cough, and able to voice. Pt placed on Bipap post extubation per MD, tolerating well.   Cherylin Mylar 07/30/2018, 1:53 PM

## 2018-07-30 NOTE — Care Management Note (Signed)
Case Management Note  Patient Details  Name: Kristopher [name] MRN: [id number] Date of Birth: 1950/11/06  Subjective/Objective: 68 yo male presented with progressive SOB and swelling.                    Action/Plan: Patient lives with son; PCP: Shirlean Mylar, MD; has private insurance with [facility] with prescription drug coverage; pharmacy of choice is [facility]. Patient remains critically ill, placed on bipap post extubation. Awaiting PT/OT eval for recommendations. CM team will continue to follow for dispositional needs.   Expected Discharge Date:                  Expected Discharge Plan:  Skilled Nursing Facility  In-House Referral:  NA  Discharge planning Services  CM Consult  Post Acute Care Choice:  NA Choice offered to:  NA  DME Arranged:  N/A DME Agency:  NA  HH Arranged:  NA HH Agency:  NA  Status of Service:  In process, will continue to follow  If discussed at Long Length of Stay Meetings, dates discussed:    Additional Comments:  Colleen Can RN, BSN, NCM-BC, ACM-RN [phone] 07/30/2018, 3:41 PM

## 2018-07-30 NOTE — Progress Notes (Signed)
Placed pt on BiPAP, pt tolerating well at this time. RT to continue to monitor as needed

## 2018-07-30 NOTE — Progress Notes (Signed)
Pt taken off Bipap at this time per MD.  Pt placed on 6L [place] tolerating well, no distress noted at this time.  RT will continue to monitor

## 2018-07-30 NOTE — Progress Notes (Signed)
PS decreased to 10 by MD.  RR 32-36, MD aware.  MD would like to extubate at this time.

## 2018-07-30 NOTE — Progress Notes (Addendum)
Patient ID: Kristopher [name], male   DOB: 1951/02/01, 68 y.o.   MRN: [id number]     Advanced Heart Failure Rounding Note  PCP-Cardiologist: Fransico Him, MD   Subjective:    Patient intubated 07/26/18 with acute hypercarbic respiratory failure and lethargy/unable to protect airway. Had transient hypotension requiring pressors.   He is on Solumedrol + doxycycline/azithro per CCM for COPD exacerbation.   Creatinine 2.66 => 1.78 => 1.33 => 1.26 => 1.44. CVP 6-7. Negative 1.4 L and weight down 4 lbs.   Tmax 101.1. WBC 17.1 (16.7). He is on IV doxy.   Follows commands on vent. PEEP 4, FiO at 40% this am.   CXR with low volume chest with mild atelectasis and small pleural effusion on the left.    Echo 07/22/18: EF 25-30%, global HK, RV normal function, increased wall thickness, LA moderately dilated, RA severely dilated  Objective:   Weight Range: 104.9 kg Body mass index is 37.33 kg/m.   Vital Signs:   Temp:  [98 F (36.7 C)-101.1 F (38.4 C)] 101.1 F (38.4 C) (02/19 0815) Pulse Rate:  [74-110] 92 (02/19 0900) Resp:  [16-27] 21 (02/19 0900) BP: (79-142)/(57-98) 99/64 (02/19 0900) SpO2:  [92 %-100 %] 96 % (02/19 0900) FiO2 (%):  [40 %-50 %] 40 % (02/19 0820) Weight:  [104.9 kg] 104.9 kg (02/19 0312) Last BM Date: 07/21/18  Weight change: Filed Weights   07/28/18 0200 07/29/18 0600 07/30/18 0312  Weight: 105.5 kg 107 kg 104.9 kg   Intake/Output:   Intake/Output Summary (Last 24 hours) at 07/30/2018 0917 Last data filed at 07/30/2018 0800 Gross per 24 hour  Intake 2298.35 ml  Output 3095 ml  Net -796.65 ml    Physical Exam    General: Intubated/sedated.  HEENT: + ETT Neck: Supple. JVP 6-7 cm. Carotids 2+ bilat; no bruits. No thyromegaly or nodule noted. Cor: PMI nondisplaced. RRR, No M/G/R noted Lungs: Diminished basilar sounds Abdomen: Soft, non-tender, non-distended, no HSM. No bruits or masses. +BS  Extremities: No cyanosis, clubbing, or rash. Trace ankle edema.    Neuro: Intubated/sedated   Telemetry   NSR 90s, occasional ectopy, personally reviewed.   EKG    No new tracings.    Labs    CBC Recent Labs    07/29/18 0335 07/30/18 0429  WBC 16.7* 17.1*  NEUTROABS 14.5* 14.5*  HGB 14.4 14.8  HCT 49.9 52.2*  MCV 89.6 90.6  PLT 238 671   Basic Metabolic Panel Recent Labs    07/29/18 0335 07/30/18 0429  NA 142 145  K 4.0 3.9  CL 103 105  CO2 30 31  GLUCOSE 135* 118*  BUN 57* 77*  CREATININE 1.26* 1.44*  CALCIUM 9.8 9.7  MG 2.3 2.2  PHOS 4.2 5.1*   Liver Function Tests Recent Labs    07/30/18 0429  AST 22  ALT 13  ALKPHOS 42  BILITOT 1.7*  PROT 6.2*  ALBUMIN 3.1*   No results for input(s): LIPASE, AMYLASE in the last 72 hours. Cardiac Enzymes No results for input(s): CKTOTAL, CKMB, CKMBINDEX, TROPONINI in the last 72 hours.  BNP: BNP (last 3 results) Recent Labs    07/21/18 1123  BNP 964.4*   ProBNP (last 3 results) No results for input(s): PROBNP in the last 8760 hours.  D-Dimer No results for input(s): DDIMER in the last 72 hours. Hemoglobin A1C No results for input(s): HGBA1C in the last 72 hours. Fasting Lipid Panel No results for input(s): CHOL, HDL, LDLCALC, TRIG, CHOLHDL, LDLDIRECT  in the last 72 hours. Thyroid Function Tests No results for input(s): TSH, T4TOTAL, T3FREE, THYROIDAB in the last 72 hours.  Invalid input(s): FREET3  Other results:  Imaging    No results found.  Medications:     Scheduled Medications: . aspirin  81 mg Oral Daily  . chlorhexidine gluconate (MEDLINE KIT)  15 mL Mouth Rinse BID  . Chlorhexidine Gluconate Cloth  6 each Topical Daily  . eye wash  2 drop Both Eyes Q6H  . famotidine  20 mg Oral BID  . feeding supplement (PRO-STAT SUGAR FREE 64)  30 mL Per Tube BID  . heparin  5,000 Units Subcutaneous Q8H  . insulin aspart  0-15 Units Subcutaneous Q4H  . ipratropium  0.5 mg Nebulization Q6H  . levalbuterol  1.25 mg Nebulization Q6H  . mouth rinse  15 mL  Mouth Rinse 10 times per day  . sennosides  5 mL Oral QHS  . sodium chloride flush  10-40 mL Intracatheter Q12H  . sodium chloride flush  3 mL Intravenous Q12H    Infusions: . sodium chloride    . sodium chloride    . dexmedetomidine 0.7 mcg/kg/hr (07/30/18 0800)  . doxycycline (VIBRAMYCIN) IV Stopped (07/30/18 9449)  . feeding supplement (VITAL HIGH PROTEIN) 50 mL/hr at 07/30/18 0800  . fentaNYL infusion INTRAVENOUS 100 mcg/hr (07/30/18 0800)  . norepinephrine (LEVOPHED) Adult infusion Stopped (07/27/18 2300)    PRN Medications: sodium chloride, Place/Maintain arterial line **AND** sodium chloride, acetaminophen, bisacodyl, docusate, fentaNYL, fentaNYL (SUBLIMAZE) injection, guaiFENesin-dextromethorphan, levalbuterol, ondansetron (ZOFRAN) IV, sodium chloride flush, sodium chloride flush  Patient Profile   Kristopher [name] is a 68 year old with history of obesity, HTN, DM, asthma and chronic diastolic/systolic heart failure due to NICM with EF 45%.   Assessment/Plan  1. Acute on chronic systolic/diastolic HF - Echo 6/75/91 EF 45%. Cath with minimal CAD - Echo 07/22/18: EF 25-30%, global HK, RV normal function, increased wall thickness, LA moderately dilated, RA severely dilated - CVP 6-7 cm. Given lasix yesterday to help facilitate extubation.  - Creatinine 1.2 -> 1.4. Continue to follow.  - Off Entresto with AKI and low BP.  - Add spiro 12.5 mg qhs.  - Coox 57% this am.   2. Acute hypoxic respiratory failure - Likely combination of HF and AECOPD. CXR today with low volume chest with presumed atelectasis at the bases.  - Remains intubated. FiO2 40%, PEEP 5. Treating AECOPD with doxycycline and Solumedrol => per CCM.   - Tmax 101.1 as above. Remains on IV doxy. Resp culture (Trach Aspirate) with moderate diptheroids (covered OK by doxy per ID pharmD)  3. Undiagnosed OSA - Will need outpatient sleep study.Will likely need to set him up with BiPAP prior to DC. No change.   4. DM2 -  Per primary team - Consider SGLT2i at d/c. No change.   5. AKI - Cr 1.44 this am. Follow.   Volume status improved. Hopeful for extubation today. Follow fever trends. ABX per CCM.   Shirley Friar, PA-C  9:17 AM  Advanced Heart Failure Team Pager [phone] (M-F; 7a - 4p)  Please contact [facility] for night-coverage after hours (4p -7a ) and weekends on [email or web address]  Remains on vent. Weaning well. Awake. Following commands. CVP 6  Awake on vent HEENT: normal + ETT Neck: supple. no JVD.  Cor: PMI nondisplaced. Regular rate & rhythm. No rubs, gallops or murmurs. Lungs: coarse Abdomen: obese soft, nontender, +distended. No hepatosplenomegaly. No bruits or masses. Good bowel  sounds. Extremities: no cyanosis, clubbing, rash, edema Neuro: awake and alert  Remains intubated. Now weaning. Hopefully can extubate today. Volume status looks good. Renal function stable.  D/w CCM at bedside Based on co-ox and CVP, main issue seems to be OHS/OSA and not HF but I do think he needs RHC prior to d/c to clearly assess hemodynamics.   CRITICAL CARE Performed by: Glori Bickers  Total critical care time: 35 minutes  Critical care time was exclusive of separately billable procedures and treating other patients.  Critical care was necessary to treat or prevent imminent or life-threatening deterioration.  Critical care was time spent personally by me (independent of midlevel providers or residents) on the following activities: development of treatment plan with patient and/or surrogate as well as nursing, discussions with consultants, evaluation of patient's response to treatment, examination of patient, obtaining history from patient or surrogate, ordering and performing treatments and interventions, ordering and review of laboratory studies, ordering and review of radiographic studies, pulse oximetry and re-evaluation of patient's condition.   Glori Bickers, MD

## 2018-07-30 NOTE — Progress Notes (Signed)
Respiratory status reviewed by Dr. Everardo All.  Pt sedation weaned, tolerating 10/5. Able to pull ~300-1L Vt.  Interacting with family.  Discussed plan with MD.  Plan to extubate to BiPAP.  RT notified.    Canary Brim, NP-C [facility] Pgr: [phone] or if no answer [phone] 07/30/2018, 1:22 PM

## 2018-07-30 NOTE — Progress Notes (Addendum)
eLink Physician-Brief Progress Note Patient Name: Kristopher [name] DOB: March 20, 1951 MRN: [id number]   Date of Service  07/30/2018  HPI/Events of Note  Self extubated  On nasal o2. Off of precedex. HR 122. Sinus tach. No fever or in pain. Supposed to go on BiPAP at night.  Asking for meds for tachycardia.  Notes seen. Reviewed.  eICU Interventions  - to go on BiPAP now. If still not better call back.  - asp precautions. sats ok.       Camera; Resting comfortably, HR 117, sats 96%, no abdominal breathing or diaphoresis. MAP > 65. On nasal o2.  To go on BiPAP and observe.   Ranee Gosselin 07/30/2018, 9:03 PM

## 2018-07-30 NOTE — Progress Notes (Signed)
-  NAME:  Kristopher [name], MRN:  [id number], DOB:  20-Jan-1951, LOS: 9 ADMISSION DATE:  07/21/2018, CONSULTATION DATE:   REFERRING MD:  CHIEF COMPLAINT:  Respiratory Failure  Brief History   68 y/o M admitted 2/10 with progressive SOB and swelling. Had approximately 15kg weight gain.  Known mixed HFrEF and diastolic heart function with COPD/Tobacco abuse. Admitted to floor initially, then transferred 2/14 to SDU secondary to hypercapnia and encephalopathy. Started on BiPAP, continued on diuretics including diamox.Steroids discontinued. Felt to be heart failure related.  Diuresed.  Extubated 2/19.    Interim history/subjective:  Weaned on PSV 10/5.  RN reports pt off fentanyl gtt, continues on low dose precedex.   Significant Hospital Events   2/10  Admit  2/14  Tx to SDU > ICU  2/15  Intubated, hypotension with diuresis / vasopressors, AKI, CAP rx. Self extubated / re-intubated 2/16 Intermittent agitation, responded to volume, off pressors.    Past Medical History  Combined Systolic / Diastolic CHF  CAD HTN LBBB Asthma / COPD  Tobacco Abuse  Pulmonary HTN PUD   Consults:  Cardiology / CHF  PCCM   Procedures:  L IJ TLC 2/15 >>  ETT 2/15 >> 2/18  Significant Diagnostic Tests:  ECHO 2/11 >> LVEF severely reduced 25-30%, RVSP ~28.6, LA moderately dilated, RA severely dilated, grade I diastolic dysfunction  Micro Data:  RVP 2/15 >> negative  Sputum 2/15 >> moderate diptheroids   Antimicrobials:  Doxycycline 2/15 >> Azithromycin 2/15 >> 2/17  Objective   Blood pressure 117/76, pulse (!) 115, temperature (!) 101.2 F (38.4 C), temperature source Oral, resp. rate (!) 34, height 5\' 6"  (1.676 m), weight 104.9 kg, SpO2 94 %. CVP:  [7 mmHg-11 mmHg] 11 mmHg  Vent Mode: BIPAP FiO2 (%):  [40 %-50 %] 40 % Set Rate:  [8 bmp-16 bmp] 8 bmp Vt Set:  [510 mL] 510 mL PEEP:  [5 cmH20-8 cmH20] 6 cmH20 Pressure Support:  [10 cmH20-15 cmH20] 10 cmH20 Plateau Pressure:  [17 cmH20-20 cmH20]  17 cmH20   Intake/Output Summary (Last 24 hours) at 07/30/2018 1417 Last data filed at 07/30/2018 1354 Gross per 24 hour  Intake 2342.46 ml  Output 2280 ml  Net 62.46 ml   Filed Weights   07/28/18 0200 07/29/18 0600 07/30/18 5053  Weight: 105.5 kg 107 kg 104.9 kg    Examination: General: adult male lying in bed on BiPAP in NAD  HEENT: MM pink/moist, BiPAP mask in place  Neuro: Awake, alert, interactive / appropriate  CV: s1s2 rrr, no m/r/g PULM: even/non-labored, lungs bilaterally clear  ZJ:QBHA, non-tender, bsx4 active  Extremities: warm/dry, trace edema  Skin: no rashes or lesions  Resolved Hospital Problem list     Assessment & Plan:   Chronic Combined Systolic / Diastolic CHF, Dilated CHF  P: CHF service following, appreciate input  Defer diuresis to CHF  ICU monitoring  Follow co-ox   Sepsis with Shock, resolved  -component of diuresis / hypovolemia P: ABX as above  Hold home ACE-I, sprinolactone  Diuresis per CHF service   Acute on Chronic Hypoxemic / Hypercarbic Respiratory Failure  -in setting of CHF, Exacerbation of COPD, +/- PNA.    AECOPD  ? PNA  -no infiltrate on CXR>> low volume ? Atelectasis -diphtheroids in sputum  P: Extubated to BiPAP Pt to wear BiPAP tonight for sleep  OK for PRN breaks in bipap support during evening  ABX as above  Discontinue steroids  Continue xopenex + atrovent   Acute  Metabolic Encephalopathy - improved P: PT efforts post extubation  Frequent re-orientation  Promote sleep / wake cycle   At Risk Malnutrition  P: NPO until SLP evaluation   Pulmonary HTN P: May need RHC at some point   Constipation  Slightly distended abdomen P: PRN regimen  Early mobilization     Best practice:  Diet: TF Pain/Anxiety/Delirium protocol (if indicated): n/a VAP protocol (if indicated): n/a DVT prophylaxis: Heparin sq GI prophylaxis: Pepcid 20 mg q12 Glucose control: CBG monitoring  Mobility: As tolerated  Code  Status: Full Family Communication: No family available on NP rounds 2/19  Disposition: ICU  Labs   CBC: Recent Labs  Lab 07/26/18 0337  07/27/18 0500 07/28/18 0350 07/28/18 0355 07/29/18 0335 07/30/18 0429  WBC 15.2*  --  22.3*  --  18.9* 16.7* 17.1*  NEUTROABS 12.9*  --  21.5*  --  17.8* 14.5* 14.5*  HGB 14.8   < > 14.4 17.0 14.6 14.4 14.8  HCT 51.7   < > 50.0 50.0 49.2 49.9 52.2*  MCV 91.0  --  87.9  --  88.2 89.6 90.6  PLT 288  --  213  --  230 238 230   < > = values in this interval not displayed.    Basic Metabolic Panel: Recent Labs  Lab 07/26/18 1429  07/27/18 0500 07/27/18 2107 07/28/18 0350 07/28/18 0355 07/29/18 0335 07/30/18 0429  NA 137   < > 138  --  138 139 142 145  K 3.9   < > 3.8  --  3.8 3.6 4.0 3.9  CL 88*  --  96*  --   --  99 103 105  CO2 32  --  30  --   --  29 30 31   GLUCOSE 149*  --  188*  --   --  172* 135* 118*  BUN 53*  --  50*  --   --  53* 57* 77*  CREATININE 2.70*  --  1.78*  --   --  1.33* 1.26* 1.44*  CALCIUM 8.6*  --  9.1  --   --  9.5 9.8 9.7  MG  --   --  2.7* 2.5*  --  2.4 2.3 2.2  PHOS  --   --  4.5 3.2  --  3.6 4.2 5.1*   < > = values in this interval not displayed.   GFR: Estimated Creatinine Clearance: 56.5 mL/min (A) (by C-G formula based on SCr of 1.44 mg/dL (H)). Recent Labs  Lab 07/26/18 1214 07/26/18 1744 07/26/18 2312 07/27/18 0500 07/28/18 0355 07/29/18 0335 07/30/18 0429  PROCALCITON 0.12  --   --   --   --   --   --   WBC  --   --   --  22.3* 18.9* 16.7* 17.1*  LATICACIDVEN  --  1.1 1.7  --   --   --   --     Liver Function Tests: Recent Labs  Lab 07/30/18 0429  AST 22  ALT 13  ALKPHOS 42  BILITOT 1.7*  PROT 6.2*  ALBUMIN 3.1*   No results for input(s): LIPASE, AMYLASE in the last 168 hours. Recent Labs  Lab 07/26/18 1214  AMMONIA 20    ABG    Component Value Date/Time   PHART 7.380 07/28/2018 0350   PCO2ART 51.3 (H) 07/28/2018 0350   PO2ART 70.0 (L) 07/28/2018 0350   HCO3 30.4 (H)  07/28/2018 0350   TCO2 32 07/28/2018 0350  O2SAT 57.0 07/30/2018 0437     Coagulation Profile: No results for input(s): INR, PROTIME in the last 168 hours.  Cardiac Enzymes: No results for input(s): CKTOTAL, CKMB, CKMBINDEX, TROPONINI in the last 168 hours.  HbA1C: Hgb A1c MFr Bld  Date/Time Value Ref Range Status  07/22/2018 09:55 AM 6.0 (H) 4.8 - 5.6 % Final    Comment:    (NOTE) Pre diabetes:          5.7%-6.4% Diabetes:              >6.4% Glycemic control for   <7.0% adults with diabetes     CBG: Recent Labs  Lab 07/29/18 2012 07/30/18 0028 07/30/18 0425 07/30/18 0816 07/30/18 1156  GLUCAP 152* 131* 107* 131* 137*     Allergies Allergies  Allergen Reactions  . Penicillins     Pt reports it makes him feel shaky Has patient had a PCN reaction causing immediate rash, facial/tongue/throat swelling, SOB or lightheadedness with hypotension: YES Has patient had a PCN reaction causing severe rash involving mucus membranes or skin necrosis: NO Has patient had a PCN reaction that required hospitalization NO Has patient had a PCN reaction occurring within the last 10 years: NO If all of the above answers are "NO", then may proceed with Cephalosporin use.       Critical care time:  30 minutes    Canary Brim, NP-C [facility] Pgr: [phone] or if no answer [phone] 07/30/2018, 2:17 PM

## 2018-07-30 NOTE — Progress Notes (Signed)
0820 - pt failed SBT trial 5/5 at this time due to decreased VT's <250 & RR >32.  Increased PS to 15, pt tolerating well.   1020 - PS decreased by RN per MD to 10.  MD would like to wean PS & extubate today. 1037 - Increased PS back to 15 at this time due to increased RR 34 7 decreased VT's. RT will monitor and wean PS down as able.

## 2018-07-31 ENCOUNTER — Inpatient Hospital Stay (HOSPITAL_COMMUNITY): Payer: Medicare Other

## 2018-07-31 DIAGNOSIS — R0602 Shortness of breath: Secondary | ICD-10-CM

## 2018-07-31 DIAGNOSIS — I5023 Acute on chronic systolic (congestive) heart failure: Secondary | ICD-10-CM

## 2018-07-31 LAB — CBC WITH DIFFERENTIAL/PLATELET
Abs Immature Granulocytes: 0 10*3/uL (ref 0.00–0.07)
BASOS ABS: 0 10*3/uL (ref 0.0–0.1)
Basophils Relative: 0 %
Eosinophils Absolute: 0 10*3/uL (ref 0.0–0.5)
Eosinophils Relative: 0 %
HCT: 52.6 % — ABNORMAL HIGH (ref 39.0–52.0)
Hemoglobin: 15 g/dL (ref 13.0–17.0)
Lymphocytes Relative: 5 %
Lymphs Abs: 1.4 10*3/uL (ref 0.7–4.0)
MCH: 25.8 pg — ABNORMAL LOW (ref 26.0–34.0)
MCHC: 28.5 g/dL — ABNORMAL LOW (ref 30.0–36.0)
MCV: 90.5 fL (ref 80.0–100.0)
Monocytes Absolute: 1.7 10*3/uL — ABNORMAL HIGH (ref 0.1–1.0)
Monocytes Relative: 6 %
NRBC: 0 % (ref 0.0–0.2)
Neutro Abs: 25.2 10*3/uL — ABNORMAL HIGH (ref 1.7–7.7)
Neutrophils Relative %: 89 %
Platelets: 214 10*3/uL (ref 150–400)
RBC: 5.81 MIL/uL (ref 4.22–5.81)
RDW: 20.1 % — ABNORMAL HIGH (ref 11.5–15.5)
WBC: 28.3 10*3/uL — ABNORMAL HIGH (ref 4.0–10.5)
nRBC: 0 /100 WBC

## 2018-07-31 LAB — MAGNESIUM: Magnesium: 2.4 mg/dL (ref 1.7–2.4)

## 2018-07-31 LAB — GLUCOSE, CAPILLARY
GLUCOSE-CAPILLARY: 120 mg/dL — AB (ref 70–99)
Glucose-Capillary: 101 mg/dL — ABNORMAL HIGH (ref 70–99)
Glucose-Capillary: 111 mg/dL — ABNORMAL HIGH (ref 70–99)
Glucose-Capillary: 122 mg/dL — ABNORMAL HIGH (ref 70–99)
Glucose-Capillary: 173 mg/dL — ABNORMAL HIGH (ref 70–99)
Glucose-Capillary: 86 mg/dL (ref 70–99)
Glucose-Capillary: 97 mg/dL (ref 70–99)

## 2018-07-31 LAB — BASIC METABOLIC PANEL
Anion gap: 11 (ref 5–15)
BUN: 98 mg/dL — ABNORMAL HIGH (ref 8–23)
CHLORIDE: 107 mmol/L (ref 98–111)
CO2: 27 mmol/L (ref 22–32)
Calcium: 9.6 mg/dL (ref 8.9–10.3)
Creatinine, Ser: 2.28 mg/dL — ABNORMAL HIGH (ref 0.61–1.24)
GFR calc non Af Amer: 29 mL/min — ABNORMAL LOW (ref >60)
GFR, EST AFRICAN AMERICAN: 33 mL/min — AB (ref >60)
Glucose, Bld: 134 mg/dL — ABNORMAL HIGH (ref 70–99)
Potassium: 4.3 mmol/L (ref 3.5–5.1)
Sodium: 145 mmol/L (ref 135–145)

## 2018-07-31 LAB — PHOSPHORUS: Phosphorus: 5.9 mg/dL — ABNORMAL HIGH (ref 2.5–4.6)

## 2018-07-31 MED ORDER — ORAL CARE MOUTH RINSE
15.0000 mL | Freq: Two times a day (BID) | OROMUCOSAL | Status: DC
  Administered 2018-07-31 – 2018-08-08 (×10): 15 mL via OROMUCOSAL

## 2018-07-31 MED ORDER — POLYETHYLENE GLYCOL 3350 17 G PO PACK
17.0000 g | PACK | Freq: Every day | ORAL | Status: DC
  Administered 2018-07-31 – 2018-08-07 (×4): 17 g via ORAL
  Filled 2018-07-31 (×7): qty 1

## 2018-07-31 MED ORDER — PANTOPRAZOLE SODIUM 40 MG PO TBEC
40.0000 mg | DELAYED_RELEASE_TABLET | Freq: Every day | ORAL | Status: DC
  Administered 2018-07-31 – 2018-08-08 (×9): 40 mg via ORAL
  Filled 2018-07-31 (×9): qty 1

## 2018-07-31 MED ORDER — LEVALBUTEROL HCL 1.25 MG/0.5ML IN NEBU
1.2500 mg | INHALATION_SOLUTION | Freq: Three times a day (TID) | RESPIRATORY_TRACT | Status: DC
  Administered 2018-07-31 – 2018-08-02 (×7): 1.25 mg via RESPIRATORY_TRACT
  Filled 2018-07-31 (×7): qty 0.5

## 2018-07-31 MED ORDER — ENSURE MAX PROTEIN PO LIQD
11.0000 [oz_av] | Freq: Two times a day (BID) | ORAL | Status: DC
  Administered 2018-07-31 – 2018-08-06 (×11): 11 [oz_av] via ORAL
  Administered 2018-08-07: 237 mL via ORAL
  Filled 2018-07-31 (×15): qty 330

## 2018-07-31 MED ORDER — IPRATROPIUM BROMIDE 0.02 % IN SOLN
0.5000 mg | Freq: Three times a day (TID) | RESPIRATORY_TRACT | Status: DC
  Administered 2018-07-31 – 2018-08-07 (×22): 0.5 mg via RESPIRATORY_TRACT
  Filled 2018-07-31 (×22): qty 2.5

## 2018-07-31 NOTE — Progress Notes (Signed)
Unable to draw coox from central line.

## 2018-07-31 NOTE — Evaluation (Signed)
Clinical/Bedside Swallow Evaluation Patient Details  Name: Kristopher [name] MRN: [id number] Date of Birth: December 29, 1950  Today's Date: 07/31/2018 Time: SLP Start Time (ACUTE ONLY): 0830 SLP Stop Time (ACUTE ONLY): 0854 SLP Time Calculation (min) (ACUTE ONLY): 24 min  Past Medical History:  Past Medical History:  Diagnosis Date  . Acute combined systolic and diastolic heart failure (HCC) 08/11/2016  . Acute esophagitis 08/17/2016  . Acute upper GI bleed 08/10/2016  . Asthma   . CAD (coronary artery disease) 08/16/2016  . CHF (congestive heart failure) (HCC)   . DCM (dilated cardiomyopathy) (HCC)   . Dyspnea   . HTN (hypertension)   . LBBB (left bundle branch block) 08/10/2016  . Lymphadenopathy 08/17/2016  . PUD (peptic ulcer disease) 08/17/2016  . Pulmonary HTN (HCC) 08/17/2016   Past Surgical History:  Past Surgical History:  Procedure Laterality Date  . ESOPHAGOGASTRODUODENOSCOPY (EGD) WITH PROPOFOL Left 08/11/2016   Procedure: ESOPHAGOGASTRODUODENOSCOPY (EGD) WITH PROPOFOL;  Surgeon: Willis Modena, MD;  Location: [facility] ENDOSCOPY;  Service: Endoscopy;  Laterality: Left;  . RIGHT/LEFT HEART CATH AND CORONARY ANGIOGRAPHY N/A 08/15/2016   Procedure: Right/Left Heart Cath and Coronary Angiography;  Surgeon: Corky Crafts, MD;  Location: [facility] INVASIVE CV LAB;  Service: Cardiovascular;  Laterality: N/A;   HPI:  JAHAIRE [name] is a 68 y.o. male with medical history significant of diastolic congestive heart failure, esophagitis, upper GI bleed, pulmonary hypertension, asthma, hypertension, and type 2 diabetes who presents the emergency department due to  confusion about his meds and has not been taking his carvedilol at all, weight gain and shortness of breath. CXR Low volume chest with mild atelectasis and small pleural effusion on the left. Found to have acute on chronic systolic/diastolic heart failure, acute hypoxic resp failure. Intubated 2/15, self extubated, re-intubated and extubated 2/19.   Assessment  / Plan / Recommendation Clinical Impression  Despite 5 day intubation (extubation and re-intubated), vocal quality WNL's, strong cough and denied odonophagia. Repositioning facilitated pt's secretion management, mobilization and expectoration. He stopped twice during 3 oz water assessment, no cough or throat clear during multiple trials thin with straw, jello and cracker. Prior to admission pt cut meats therefore Dys 3 (mech soft ), thin liquids ordered. Recommend pills with water, upright position. Educated re: swallow and respiratory precautions given history of emphysema and current respiratory issues. No follow up ST needed and recommend discharge on baseline texture of Dys 3>      SLP Visit Diagnosis: Dysphagia, unspecified (R13.10)    Aspiration Risk  Mild aspiration risk    Diet Recommendation Dysphagia 3 (Mech soft);Thin liquid   Liquid Administration via: Cup;Straw Medication Administration: Whole meds with liquid Supervision: Patient able to self feed;Intermittent supervision to cue for compensatory strategies Compensations: Slow rate;Small sips/bites Postural Changes: Seated upright at 90 degrees    Other  Recommendations Oral Care Recommendations: Oral care BID   Follow up Recommendations None      Frequency and Duration            Prognosis        Swallow Study   General HPI: Kristopher [name] is a 68 y.o. male with medical history significant of diastolic congestive heart failure, esophagitis, upper GI bleed, pulmonary hypertension, asthma, hypertension, and type 2 diabetes who presents the emergency department due to  confusion about his meds and has not been taking his carvedilol at all, weight gain and shortness of breath. CXR Low volume chest with mild atelectasis and small pleural effusion on the  left. Found to have acute on chronic systolic/diastolic heart failure, acute hypoxic resp failure. Intubated 2/15, self extubated, re-intubated and extubated 2/19. Type of  Study: Bedside Swallow Evaluation Previous Swallow Assessment: none Diet Prior to this Study: NPO Temperature Spikes Noted: No Respiratory Status: Nasal cannula History of Recent Intubation: Yes Length of Intubations (days): 5 days Date extubated: 07/30/18 Behavior/Cognition: Alert;Cooperative;Pleasant mood Oral Cavity Assessment: Within Functional Limits Oral Care Completed by SLP: Recent completion by staff Oral Cavity - Dentition: Missing dentition;Poor condition(no upper, 4 lower in poor condition) Vision: Functional for self-feeding Self-Feeding Abilities: Able to feed self;Needs set up Patient Positioning: Upright in bed Baseline Vocal Quality: Normal Volitional Cough: Strong Volitional Swallow: Able to elicit    Oral/Motor/Sensory Function Overall Oral Motor/Sensory Function: Within functional limits   Ice Chips Ice chips: Within functional limits   Thin Liquid Thin Liquid: Within functional limits Presentation: Cup;Straw    Nectar Thick Nectar Thick Liquid: Not tested   Honey Thick Honey Thick Liquid: Not tested   Puree Puree: Within functional limits   Solid     Solid: Within functional limits      Royce Macadamia 07/31/2018,9:08 AM  Breck Coons Lonell Face.Ed Nurse, children's [phone] Office [phone]

## 2018-07-31 NOTE — Progress Notes (Signed)
Patient ID: Kristopher [name], male   DOB: [birth year], 68 y.o.   MRN: [id number]     Advanced Heart Failure Rounding Note  PCP-Cardiologist: Fransico Him, MD   Subjective:    Extubated yesterday. Was on IV lasix. Creatinine 1.4 -> 2.3 WBC 17-> 28 (on steroids)   Feels better. Denies SOB or CP. Throat sore   Echo 07/22/18: EF 25-30%, global HK, RV normal function, increased wall thickness, LA moderately dilated, RA severely dilated  Objective:   Weight Range: 105.5 kg Body mass index is 37.54 kg/m.   Vital Signs:   Temp:  [97.8 F (36.6 C)-101.2 F (38.4 C)] 97.8 F (36.6 C) (02/20 0400) Pulse Rate:  [56-122] 105 (02/20 0500) Resp:  [18-34] 26 (02/20 0500) BP: (89-129)/(54-89) 107/63 (02/20 0500) SpO2:  [91 %-100 %] 96 % (02/20 0500) FiO2 (%):  [40 %-50 %] 50 % (02/20 0342) Weight:  [105.5 kg] 105.5 kg (02/20 0500) Last BM Date: 07/21/18  Weight change: Filed Weights   07/29/18 0600 07/30/18 0312 07/31/18 0500  Weight: 107 kg 104.9 kg 105.5 kg   Intake/Output:   Intake/Output Summary (Last 24 hours) at 07/31/2018 0657 Last data filed at 07/30/2018 2323 Gross per 24 hour  Intake 1262.13 ml  Output 1110 ml  Net 152.13 ml    Physical Exam    General:  Lying in bed . No resp difficulty HEENT: normal Neck: supple. No obvious  JVD. Carotids 2+ bilat; no bruits. No lymphadenopathy or thryomegaly appreciated. Cor: PMI nondisplaced. Regular rate & rhythm. No rubs, gallops or murmurs. Lungs: clear with decreased BS throughout Abdomen: obese soft, nontender, nondistended. No hepatosplenomegaly. No bruits or masses. Good bowel sounds. Extremities: no cyanosis, clubbing, rash, edema Neuro: alert & orientedx3, cranial nerves grossly intact. moves all 4 extremities w/o difficulty. Affect pleasant   Telemetry   Sinus 90-105s, occasional ectopy, personally reviewed.   EKG    No new tracings.    Labs    CBC Recent Labs    07/30/18 0429 07/31/18 0411  WBC 17.1* 28.3*    NEUTROABS 14.5* 25.2*  HGB 14.8 15.0  HCT 52.2* 52.6*  MCV 90.6 90.5  PLT 230 683   Basic Metabolic Panel Recent Labs    07/30/18 0429 07/31/18 0411  NA 145 145  K 3.9 4.3  CL 105 107  CO2 31 27  GLUCOSE 118* 134*  BUN 77* 98*  CREATININE 1.44* 2.28*  CALCIUM 9.7 9.6  MG 2.2 2.4  PHOS 5.1* 5.9*   Liver Function Tests Recent Labs    07/30/18 0429  AST 22  ALT 13  ALKPHOS 42  BILITOT 1.7*  PROT 6.2*  ALBUMIN 3.1*   No results for input(s): LIPASE, AMYLASE in the last 72 hours. Cardiac Enzymes No results for input(s): CKTOTAL, CKMB, CKMBINDEX, TROPONINI in the last 72 hours.  BNP: BNP (last 3 results) Recent Labs    07/21/18 1123  BNP 964.4*   ProBNP (last 3 results) No results for input(s): PROBNP in the last 8760 hours.  D-Dimer No results for input(s): DDIMER in the last 72 hours. Hemoglobin A1C No results for input(s): HGBA1C in the last 72 hours. Fasting Lipid Panel No results for input(s): CHOL, HDL, LDLCALC, TRIG, CHOLHDL, LDLDIRECT in the last 72 hours. Thyroid Function Tests No results for input(s): TSH, T4TOTAL, T3FREE, THYROIDAB in the last 72 hours.  Invalid input(s): FREET3  Other results:  Imaging    No results found.  Medications:     Scheduled Medications: . aspirin  81 mg Oral Daily  . chlorhexidine gluconate (MEDLINE KIT)  15 mL Mouth Rinse BID  . heparin  5,000 Units Subcutaneous Q8H  . insulin aspart  0-15 Units Subcutaneous Q4H  . ipratropium  0.5 mg Nebulization TID  . levalbuterol  1.25 mg Nebulization TID  . sennosides  5 mL Oral QHS  . sodium chloride flush  10-40 mL Intracatheter Q12H  . sodium chloride flush  3 mL Intravenous Q12H  . spironolactone  12.5 mg Oral QHS    Infusions: . sodium chloride    . doxycycline (VIBRAMYCIN) IV 100 mg (07/31/18 0436)    PRN Medications: sodium chloride, acetaminophen, bisacodyl, guaiFENesin-dextromethorphan, HYDROmorphone (DILAUDID) injection, levalbuterol, ondansetron  (ZOFRAN) IV, sodium chloride flush, sodium chloride flush  Patient Profile   Kristopher [name] is a 68 year old with history of obesity, HTN, DM, asthma and chronic diastolic/systolic heart failure due to NICM with EF 45%.   Assessment/Plan  1. Acute on chronic systolic/diastolic HF - Echo 4/79/98 EF 45%. Cath with minimal CAD - Echo 07/22/18: EF 25-30%, global HK, RV normal function, increased wall thickness, LA moderately dilated, RA severely dilated - CVP 6-7 cm.  - Creatinine 1.2 -> 1.4 -> 2.28. Likely dry. Hold lasix. Can hydrate gently if needed - Off Entresto with AKI and low BP.  - Hold spiro - Coox unable to draw this am. Will retry. Has been stable 57-73% - This admit mostly secondary to COPD flare but likely component of HF. With lower EF Plan left and RHC prior to d/c to measure pulmonary pressures and reassess for CAD. Likely Monday when renal function better.   2. Acute hypoxic respiratory failure - Likely combination of HF and AECOPD. CXR today with low volume chest with presumed atelectasis at the bases.  - Extubated 2/19. CCM managing   3. Undiagnosed OSA - Will need outpatient sleep study.Will likely need to set him up with BiPAP prior to DC. No change.   4. DM2 - Per primary team - Consider SGLT2i at d/c. No change.   5. AKI - Cr 1.44 -> 2.28 this am. Follow.     Glori Bickers, MD  6:57 AM  Advanced Heart Failure Team Pager [phone] (M-F; 7a - 4p)  Please contact [facility] for night-coverage after hours (4p -7a ) and weekends on [email or web address]

## 2018-07-31 NOTE — Evaluation (Signed)
Occupational Therapy Evaluation Patient Details Name: Kristopher [name] MRN: [id number] DOB: 13-May-1951 Today's Date: 07/31/2018    History of Present Illness Pt adm with acute on chronic systolic/diastolic heart failure, acute hypoxic resp failure. Intubated 2/15, self extubated, re-intubated and extubated 2/19. PMH - GI bleed, pulmonary hypertension, asthma, HTN, DM   Clinical Impression   Pt was independent prior to admission. Lives with a roommate, but has the option to go home with his son who is available PRN. Pt presents with generalized weakness, decreased activity tolerance and impaired standing balance. He requires set up to max assist for ADL and 2 person assist for OOB mobility. Pt currently requires 6L 02 to maintain Sp02 >90%. Will follow acutely. Anticipate pt will need post acute rehab in SNF.    Follow Up Recommendations  SNF;Supervision/Assistance - 24 hour    Equipment Recommendations  3 in 1 bedside commode    Recommendations for Other Services       Precautions / Restrictions Precautions Precautions: Fall Restrictions Weight Bearing Restrictions: No      Mobility Bed Mobility               General bed mobility comments: received in chair  Transfers Overall transfer level: Needs assistance Equipment used: Rolling walker (2 wheeled) Transfers: Sit to/from [employer] Transfers Sit to Stand: +2 physical assistance;Mod assist Stand pivot transfers: +2 physical assistance;Min assist       General transfer comment: assist to rise and steady using momentum, assist for balance and lines with transfer    Balance Overall balance assessment: Needs assistance   Sitting balance-Leahy Scale: Fair     Standing balance support: Bilateral upper extremity supported Standing balance-Leahy Scale: Poor Standing balance comment: unable to release walker in standing                           ADL either performed or assessed with clinical  judgement   ADL Overall ADL's : Needs assistance/impaired Eating/Feeding: Independent;Sitting   Grooming: Wash/dry hands;Wash/dry face;Oral care;Sitting;Set up   Upper Body Bathing: Moderate assistance;Sitting   Lower Body Bathing: Maximal assistance;Sit to/from stand   Upper Body Dressing : Minimal assistance;Sitting   Lower Body Dressing: Sitting/lateral leans;Set up Lower Body Dressing Details (indicate cue type and reason): can cross foot over opposite knee to manage socks Toilet Transfer: +2 for physical assistance;Minimal assistance;Stand-pivot;RW   Toileting- Clothing Manipulation and Hygiene: Maximal assistance;Sit to/from stand;+2 for physical assistance         General ADL Comments: pt currently on 6L 02     Vision Baseline Vision/History: Wears glasses Patient Visual Report: No change from baseline       Perception     Praxis      Pertinent Vitals/Pain Pain Assessment: Faces Faces Pain Scale: No hurt     Hand Dominance Right   Extremity/Trunk Assessment Upper Extremity Assessment Upper Extremity Assessment: Overall WFL for tasks assessed   Lower Extremity Assessment Lower Extremity Assessment: Defer to PT evaluation   Cervical / Trunk Assessment Cervical / Trunk Assessment: Other exceptions(obesity)   Communication Communication Communication: No difficulties   Cognition Arousal/Alertness: Awake/alert Behavior During Therapy: WFL for tasks assessed/performed Overall Cognitive Status: Within Functional Limits for tasks assessed                                     General Comments  Exercises     Shoulder Instructions      Home Living Family/patient expects to be discharged to:: Private residence Living Arrangements: Non-relatives/Friends(roommate) Available Help at Discharge: Family;Available PRN/intermittently(plans to go home with son) Type of Home: House Home Access: Ramped entrance     Home Layout: One level      Bathroom Shower/Tub: Producer, television/film/video: Standard     Home Equipment: None          Prior Functioning/Environment Level of Independence: Independent        Comments: pt is not on 02 at home        OT Problem List: Decreased strength;Decreased activity tolerance;Impaired balance (sitting and/or standing);Decreased knowledge of use of DME or AE;Cardiopulmonary status limiting activity;Obesity      OT Treatment/Interventions: Self-care/ADL training;Energy conservation;Therapeutic activities;Patient/family education;Balance training;DME and/or AE instruction    OT Goals(Current goals can be found in the care plan section) Acute Rehab OT Goals Patient Stated Goal: "I need to be independent to go home with my son." OT Goal Formulation: With patient Time For Goal Achievement: 08/14/18 Potential to Achieve Goals: Good ADL Goals Pt Will Perform Grooming: with min assist;standing Pt Will Perform Lower Body Bathing: with min assist;sit to/from stand Pt Will Perform Lower Body Dressing: with min assist;sit to/from stand Pt Will Transfer to Toilet: with min assist;ambulating;bedside commode(over toilet) Pt Will Perform Toileting - Clothing Manipulation and hygiene: with min assist;sit to/from stand Additional ADL Goal #1: Pt will utilize energy conservation strategies and breathing techniques during exertion with min cues.  OT Frequency: Min 2X/week   Barriers to D/C: Decreased caregiver support          Co-evaluation              AM-PAC OT "6 Clicks" Daily Activity     Outcome Measure Help from another person eating meals?: None Help from another person taking care of personal grooming?: A Little Help from another person toileting, which includes using toliet, bedpan, or urinal?: Total Help from another person bathing (including washing, rinsing, drying)?: A Lot Help from another person to put on and taking off regular upper body clothing?: A  Little Help from another person to put on and taking off regular lower body clothing?: A Lot 6 Click Score: 15   End of Session Equipment Utilized During Treatment: Gait belt;Rolling walker;Oxygen(6L) Nurse Communication: Mobility status  Activity Tolerance: Patient tolerated treatment well Patient left: in chair;with call bell/phone within reach;with family/visitor present  OT Visit Diagnosis: Unsteadiness on feet (R26.81);Other abnormalities of gait and mobility (R26.89);Muscle weakness (generalized) (M62.81)                Time: 0962-8366 OT Time Calculation (min): 28 min Charges:  OT General Charges $OT Visit: 1 Visit OT Evaluation $OT Eval Moderate Complexity: 1 Mod OT Treatments $Self Care/Home Management : 8-22 mins  Martie Round, OTR/L Acute Rehabilitation Services Pager: [phone] Office: [phone]  Evern Bio 07/31/2018, 12:02 PM

## 2018-07-31 NOTE — Progress Notes (Signed)
-  NAME:  Kristopher [name], MRN:  [id number], DOB:  1951-03-30, LOS: 10 ADMISSION DATE:  07/21/2018, CONSULTATION DATE:   REFERRING MD:  CHIEF COMPLAINT:  Respiratory Failure  Brief History   68 y/o M admitted 2/10 with progressive SOB and swelling. Had approximately 15kg weight gain.  Known mixed HFrEF and diastolic heart function with COPD/Tobacco abuse. Admitted to floor initially, then transferred 2/14 to SDU secondary to hypercapnia and encephalopathy. Started on BiPAP, continued on diuretics including diamox.Steroids discontinued. Felt to be heart failure related.  Diuresed.  Extubated 2/19.    Interim history/subjective:  Pt tolerated extubation, on 5L [place] O2, Tmax 101.2 in last 24 hours.  Pt denies pain, reports occasional SOB with exertion.   Significant Hospital Events   2/10  Admit  2/14  Tx to SDU > ICU  2/15  Intubated, hypotension with diuresis / vasopressors, AKI, CAP rx.  2/15  Self extubated / re-intubated 2/16  Intermittent agitation, responded to volume, off pressors.   2/19  Extubated   Past Medical History  Combined Systolic / Diastolic CHF  CAD HTN LBBB Asthma / COPD  Tobacco Abuse  Pulmonary HTN PUD   Consults:  Cardiology / CHF  PCCM   Procedures:  L IJ TLC 2/15 >>  ETT 2/15 >> 2/18  Significant Diagnostic Tests:  ECHO 2/11 >> LVEF severely reduced 25-30%, RVSP ~28.6, LA moderately dilated, RA severely dilated, grade I diastolic dysfunction  Micro Data:  RVP 2/15 >> negative  Sputum 2/15 >> moderate diptheroids   Antimicrobials:  Doxycycline 2/15 >> Azithromycin 2/15 >> 2/17  Objective   Blood pressure 98/67, pulse (!) 102, temperature 98.1 F (36.7 C), temperature source Oral, resp. rate (!) 25, height 5\' 6"  (1.676 m), weight 105.5 kg, SpO2 91 %. CVP:  [11 mmHg-12 mmHg] 11 mmHg  Vent Mode: BIPAP FiO2 (%):  [40 %-50 %] 50 % Set Rate:  [8 bmp] 8 bmp PEEP:  [5 cmH20-6 cmH20] 6 cmH20 Pressure Support:  [10 cmH20-15 cmH20] 10 cmH20    Intake/Output Summary (Last 24 hours) at 07/31/2018 0931 Last data filed at 07/31/2018 0700 Gross per 24 hour  Intake 801.71 ml  Output 935 ml  Net -133.29 ml   Filed Weights   07/29/18 0600 07/30/18 0312 07/31/18 0500  Weight: 107 kg 104.9 kg 105.5 kg    Examination: General: adult male sitting up in bed watching TV in NAD  HEENT: MM pink/moist, [place] O2  Neuro: Awake/alert, oriented, MAE, strong voice CV: s1s2 rrr, no m/r/g PULM: even/non-labored, lungs bilaterally clear anterior, diminished bases  OE:HOZY, non-tender, bsx4 active  Extremities: warm/dry, no significant edema  Skin: no rashes or lesions  Resolved Hospital Problem list     Assessment & Plan:   Chronic Combined Systolic / Diastolic CHF, Dilated CHF  P: CHF service following, appreciate assistance  Defer diuresis to CHF  Follow Co-ox if able > unable to draw this am.  Have asked IV team to assess TLC > if unable to draw from line, ok to remove if can get PIV   Sepsis with Shock, resolved  -component of diuresis / hypovolemia P: ABX as above, stop date added for doxy  Follow fever curve / wbc trend  Continue spironolactone  Hold home ACE-I Remove foley, continue strict I/O's   Acute on Chronic Hypoxemic / Hypercarbic Respiratory Failure  -in setting of CHF, Exacerbation of COPD, +/- PNA.    AECOPD  ? PNA  -no infiltrate on CXR>> low volume ? Atelectasis -  diphtheroids in sputum  P: Pulmonary hygiene-IS, mobilize  PRN BiPAP for increased WOB  Continue xopenex + atrovent   Suspected OSA P: Will need outpatient sleep study   Deconditioning  Acute Metabolic Encephalopathy - resolved P: PT efforts   At Risk Malnutrition  P: Diet as tolerated SLP following   Pulmonary HTN P: May need RHC at some point   Constipation  Slightly distended abdomen P: PRN regimen, mobilization     Best practice:  Diet: D3 diet  Pain/Anxiety/Delirium protocol (if indicated): n/a VAP protocol (if  indicated): n/a DVT prophylaxis: Heparin sq GI prophylaxis: n/a Glucose control: CBG monitoring  Mobility: As tolerated  Code Status: Full Family Communication: No family available 2/20.  Pt updated on plan of care.   Disposition: ICU   Labs   CBC: Recent Labs  Lab 07/27/18 0500 07/28/18 0350 07/28/18 0355 07/29/18 0335 07/30/18 0429 07/31/18 0411  WBC 22.3*  --  18.9* 16.7* 17.1* 28.3*  NEUTROABS 21.5*  --  17.8* 14.5* 14.5* 25.2*  HGB 14.4 17.0 14.6 14.4 14.8 15.0  HCT 50.0 50.0 49.2 49.9 52.2* 52.6*  MCV 87.9  --  88.2 89.6 90.6 90.5  PLT 213  --  230 238 230 214    Basic Metabolic Panel: Recent Labs  Lab 07/27/18 0500 07/27/18 2107 07/28/18 0350 07/28/18 0355 07/29/18 0335 07/30/18 0429 07/31/18 0411  NA 138  --  138 139 142 145 145  K 3.8  --  3.8 3.6 4.0 3.9 4.3  CL 96*  --   --  99 103 105 107  CO2 30  --   --  29 30 31 27   GLUCOSE 188*  --   --  172* 135* 118* 134*  BUN 50*  --   --  53* 57* 77* 98*  CREATININE 1.78*  --   --  1.33* 1.26* 1.44* 2.28*  CALCIUM 9.1  --   --  9.5 9.8 9.7 9.6  MG 2.7* 2.5*  --  2.4 2.3 2.2 2.4  PHOS 4.5 3.2  --  3.6 4.2 5.1* 5.9*   GFR: Estimated Creatinine Clearance: 35.8 mL/min (A) (by C-G formula based on SCr of 2.28 mg/dL (H)). Recent Labs  Lab 07/26/18 1214 07/26/18 1744 07/26/18 2312  07/28/18 0355 07/29/18 0335 07/30/18 0429 07/31/18 0411  PROCALCITON 0.12  --   --   --   --   --   --   --   WBC  --   --   --    < > 18.9* 16.7* 17.1* 28.3*  LATICACIDVEN  --  1.1 1.7  --   --   --   --   --    < > = values in this interval not displayed.    Liver Function Tests: Recent Labs  Lab 07/30/18 0429  AST 22  ALT 13  ALKPHOS 42  BILITOT 1.7*  PROT 6.2*  ALBUMIN 3.1*   No results for input(s): LIPASE, AMYLASE in the last 168 hours. Recent Labs  Lab 07/26/18 1214  AMMONIA 20    ABG    Component Value Date/Time   PHART 7.380 07/28/2018 0350   PCO2ART 51.3 (H) 07/28/2018 0350   PO2ART 70.0 (L)  07/28/2018 0350   HCO3 30.4 (H) 07/28/2018 0350   TCO2 32 07/28/2018 0350   O2SAT 57.0 07/30/2018 0437     Coagulation Profile: No results for input(s): INR, PROTIME in the last 168 hours.  Cardiac Enzymes: No results for input(s): CKTOTAL, CKMB, CKMBINDEX,  TROPONINI in the last 168 hours.  HbA1C: Hgb A1c MFr Bld  Date/Time Value Ref Range Status  07/22/2018 09:55 AM 6.0 (H) 4.8 - 5.6 % Final    Comment:    (NOTE) Pre diabetes:          5.7%-6.4% Diabetes:              >6.4% Glycemic control for   <7.0% adults with diabetes     CBG: Recent Labs  Lab 07/30/18 1659 07/30/18 1950 07/30/18 2320 07/31/18 0431 07/31/18 0818  GLUCAP 144* 122* 113* 120* 97     Critical care time:     Canary Brim, NP-C [facility] Pgr: [phone] or if no answer [phone] 07/31/2018, 9:31 AM

## 2018-07-31 NOTE — Progress Notes (Signed)
Nutrition Follow-up  DOCUMENTATION CODES:   Obesity unspecified  INTERVENTION:   - Ensure Max po BID, each supplement provides 150 kcal and 30 grams of protein  - Encourage adequate PO intake  NUTRITION DIAGNOSIS:   Inadequate oral intake related to inability to eat as evidenced by NPO status.  Progressing, pt now on Dysphagia 3 diet  GOAL:   Patient will meet greater than or equal to 90% of their needs  Progressing  MONITOR:   PO intake, Supplement acceptance, I & O's, Labs, Weight trends  REASON FOR ASSESSMENT:   Ventilator, Consult Enteral/tube feeding initiation and management  ASSESSMENT:   68 year old male who presented to the ED on 2/10 with SOB and leg swelling.  PMH significant for CHF, HTN, T2DM, CAD. Pt admitted with CHF exacerbation.  2/14 - transferred to ICU 2/15 - intubated, self-extubated, re-intubated 2/19 - extubated  Diet advanced to Dysphagia 3 with thin liquids this AM per SLP.  Overall, weight down 20 lbs since admission.  Spoke with pt and family at bedside. Pt coughing and suctioning. During visit, pt experienced a small amount of watery emesis. Alerted RN who came to change pt's gown.  Pt states that he did not eat much for breakfast this morning. Pt denies any abdominal pain or throat soreness. Pt states that he is "just trying to take it slow" with eating.  Spoke with pt's family member who reports that pt's appetite decreased over the last several months due to fluid accumulation.  Medications reviewed and include: SSI q 4 hours, Senokot, IV abx  Labs reviewed: BUN 98 (H), creatinine 2.28 (H), phosphorus 5.9 (H) CBG's: 97, 120, 113, 122, 144, 137 x 24 hours  UOP: 1110 ml x 24 hours I/O's: -9.7 L since admit  Diet Order:   Diet Order            DIET DYS 3 Room service appropriate? Yes; Fluid consistency: Thin  Diet effective now              EDUCATION NEEDS:   No education needs have been identified at this  time  Skin:  Skin Assessment: Reviewed RN Assessment  Last BM:  2/10  Height:   Ht Readings from Last 1 Encounters:  07/26/18 5\' 6"  (1.676 m)    Weight:   Wt Readings from Last 1 Encounters:  07/31/18 105.5 kg    Ideal Body Weight:  64.5 kg  BMI:  Body mass index is 37.54 kg/m.  Estimated Nutritional Needs:   Kcal:  2100-2300  Protein:  105-120 grams  Fluid:  per MD    Earma Reading, MS, RD, LDN Inpatient Clinical Dietitian Pager: [phone] Weekend/After Hours: [phone]

## 2018-07-31 NOTE — Evaluation (Addendum)
Physical Therapy Evaluation Patient Details Name: Kristopher [name] MRN: [id number] DOB: 22-Jan-1951 Today's Date: 07/31/2018   History of Present Illness  Pt adm with acute on chronic systolic/diastolic heart failure, acute hypoxic resp failure. Intubated 2/15, self extubated, re-intubated and extubated 2/19. PMH - GI bleed, pulmonary hypertension, asthma, HTN, DM  Clinical Impression  Pt admitted with above diagnosis and presents to PT with functional limitations due to deficits listed below (See PT problem list). Pt needs skilled PT to maximize independence and safety to allow discharge to ST-SNF. Pt will need to be independent before going to son's home.     Follow Up Recommendations SNF;Supervision/Assistance - 24 hour    Equipment Recommendations  Other (comment)(to be determined)    Recommendations for Other Services       Precautions / Restrictions Precautions Precautions: Fall Restrictions Weight Bearing Restrictions: No      Mobility  Bed Mobility Overal bed mobility: Needs Assistance Bed Mobility: Supine to Sit     Supine to sit: +2 for physical assistance;Mod assist     General bed mobility comments: Assist to bring legs off of bed, elevate trunk into sitting, and bring hips to EOB.  Transfers Overall transfer level: Needs assistance Equipment used: Rolling walker (2 wheeled) Transfers: Sit to/from [employer] Sit to Stand: +2 physical assistance;Mod assist Stand pivot transfers: +2 physical assistance;Min assist       General transfer comment: Assist to bring hips up and for balance. Bed to chair with walker with short shuffling steps.   Ambulation/Gait                Stairs            Wheelchair Mobility    Modified Rankin (Stroke Patients Only)       Balance Overall balance assessment: Needs assistance Sitting-balance support: No upper extremity supported;Feet supported Sitting balance-Leahy Scale: Fair      Standing balance support: Bilateral upper extremity supported Standing balance-Leahy Scale: Poor Standing balance comment: walker and min assist for static standing                             Pertinent Vitals/Pain Pain Assessment: No/denies pain Faces Pain Scale: No hurt    Home Living Family/patient expects to be discharged to:: Private residence Living Arrangements: Non-relatives/Friends(roommate) Available Help at Discharge: Family;Available PRN/intermittently(plans to go home with son) Type of Home: House Home Access: Ramped entrance     Home Layout: One level Home Equipment: Walker - 4 wheels      Prior Function Level of Independence: Independent         Comments: pt is not on 02 at home     Hand Dominance   Dominant Hand: Right    Extremity/Trunk Assessment   Upper Extremity Assessment Upper Extremity Assessment: Defer to OT evaluation    Lower Extremity Assessment Lower Extremity Assessment: Generalized weakness    Cervical / Trunk Assessment Cervical / Trunk Assessment: Other exceptions(obesity)  Communication   Communication: No difficulties  Cognition Arousal/Alertness: Awake/alert Behavior During Therapy: WFL for tasks assessed/performed Overall Cognitive Status: Within Functional Limits for tasks assessed                                        General Comments      Exercises     Assessment/Plan  PT Assessment Patient needs continued PT services  PT Problem List Decreased strength;Decreased activity tolerance;Decreased balance;Decreased mobility;Obesity       PT Treatment Interventions DME instruction;Gait training;Functional mobility training;Therapeutic activities;Therapeutic exercise;Balance training;Patient/family education    PT Goals (Current goals can be found in the Care Plan section)  Acute Rehab PT Goals Patient Stated Goal: Be independent PT Goal Formulation: With patient Time For Goal  Achievement: 08/14/18 Potential to Achieve Goals: Good    Frequency Min 2X/week   Barriers to discharge Decreased caregiver support Doesn't have 24 hour assist    Co-evaluation               AM-PAC PT "6 Clicks" Mobility  Outcome Measure Help needed turning from your back to your side while in a flat bed without using bedrails?: A Lot Help needed moving from lying on your back to sitting on the side of a flat bed without using bedrails?: A Lot Help needed moving to and from a bed to a chair (including a wheelchair)?: A Lot Help needed standing up from a chair using your arms (e.g., wheelchair or bedside chair)?: A Lot Help needed to walk in hospital room?: Total Help needed climbing 3-5 steps with a railing? : Total 6 Click Score: 10    End of Session Equipment Utilized During Treatment: Oxygen Activity Tolerance: Patient limited by fatigue Patient left: in chair;with call bell/phone within reach;with chair alarm set Nurse Communication: Mobility status(nurse assisted) PT Visit Diagnosis: Unsteadiness on feet (R26.81);Other abnormalities of gait and mobility (R26.89);Muscle weakness (generalized) (M62.81)    Time: 6144-3154 PT Time Calculation (min) (ACUTE ONLY): 31 min   Charges:   PT Evaluation $PT Eval Moderate Complexity: 1 Mod PT Treatments $Therapeutic Activity: 8-22 mins        [facility] PT Acute Rehabilitation Services Pager [phone] Office [phone]   Angelina Ok [facility] 07/31/2018, 12:30 PM

## 2018-07-31 NOTE — Progress Notes (Signed)
RT placed patient on BIPAP HS. Patient tolerating well at this time.  

## 2018-08-01 LAB — BASIC METABOLIC PANEL
Anion gap: 8 (ref 5–15)
BUN: 80 mg/dL — ABNORMAL HIGH (ref 8–23)
CO2: 31 mmol/L (ref 22–32)
Calcium: 9.6 mg/dL (ref 8.9–10.3)
Chloride: 107 mmol/L (ref 98–111)
Creatinine, Ser: 1.43 mg/dL — ABNORMAL HIGH (ref 0.61–1.24)
GFR calc Af Amer: 58 mL/min — ABNORMAL LOW (ref >60)
GFR calc non Af Amer: 50 mL/min — ABNORMAL LOW (ref >60)
Glucose, Bld: 172 mg/dL — ABNORMAL HIGH (ref 70–99)
Potassium: 3.8 mmol/L (ref 3.5–5.1)
Sodium: 146 mmol/L — ABNORMAL HIGH (ref 135–145)

## 2018-08-01 LAB — CBC WITH DIFFERENTIAL/PLATELET
Abs Immature Granulocytes: 0.15 10*3/uL — ABNORMAL HIGH (ref 0.00–0.07)
Basophils Absolute: 0 10*3/uL (ref 0.0–0.1)
Basophils Relative: 0 %
EOS ABS: 0 10*3/uL (ref 0.0–0.5)
Eosinophils Relative: 0 %
HCT: 48.4 % (ref 39.0–52.0)
Hemoglobin: 13.7 g/dL (ref 13.0–17.0)
IMMATURE GRANULOCYTES: 1 %
Lymphocytes Relative: 3 %
Lymphs Abs: 0.7 10*3/uL (ref 0.7–4.0)
MCH: 25.7 pg — ABNORMAL LOW (ref 26.0–34.0)
MCHC: 28.3 g/dL — ABNORMAL LOW (ref 30.0–36.0)
MCV: 90.6 fL (ref 80.0–100.0)
MONOS PCT: 7 %
Monocytes Absolute: 1.9 10*3/uL — ABNORMAL HIGH (ref 0.1–1.0)
Neutro Abs: 23.9 10*3/uL — ABNORMAL HIGH (ref 1.7–7.7)
Neutrophils Relative %: 89 %
Platelets: 220 10*3/uL (ref 150–400)
RBC: 5.34 MIL/uL (ref 4.22–5.81)
RDW: 19.9 % — ABNORMAL HIGH (ref 11.5–15.5)
WBC: 26.7 10*3/uL — ABNORMAL HIGH (ref 4.0–10.5)
nRBC: 0 % (ref 0.0–0.2)

## 2018-08-01 LAB — PHOSPHORUS: Phosphorus: 3 mg/dL (ref 2.5–4.6)

## 2018-08-01 LAB — GLUCOSE, CAPILLARY
Glucose-Capillary: 95 mg/dL (ref 70–99)
Glucose-Capillary: 95 mg/dL (ref 70–99)
Glucose-Capillary: 118 mg/dL — ABNORMAL HIGH (ref 70–99)
Glucose-Capillary: 127 mg/dL — ABNORMAL HIGH (ref 70–99)
Glucose-Capillary: 88 mg/dL (ref 70–99)

## 2018-08-01 LAB — MAGNESIUM: MAGNESIUM: 2.4 mg/dL (ref 1.7–2.4)

## 2018-08-01 LAB — COOXEMETRY PANEL
CARBOXYHEMOGLOBIN: 1.4 % (ref 0.5–1.5)
Methemoglobin: 1.5 % (ref 0.0–1.5)
O2 Saturation: 72.2 %
Total hemoglobin: 14.6 g/dL (ref 12.0–16.0)

## 2018-08-01 MED ORDER — FUROSEMIDE 40 MG PO TABS
40.0000 mg | ORAL_TABLET | Freq: Every day | ORAL | Status: DC
  Administered 2018-08-01 – 2018-08-03 (×3): 40 mg via ORAL
  Filled 2018-08-01 (×4): qty 1

## 2018-08-01 NOTE — Progress Notes (Signed)
Patient removed condom cath, states he would prefer not to use at this time.  Incontinent of large amount of urine.  Advised patient to call for assist to use urinal so urine can be measured.

## 2018-08-01 NOTE — Progress Notes (Addendum)
Patient ID: Kristopher [name], male   DOB: [birth year], 68 y.o.   MRN: [id number]     Advanced Heart Failure Rounding Note  PCP-Cardiologist: Fransico Him, MD   Subjective:    Extubated 07/30/18. Was on IV lasix. Creatinine 1.4 -> 2.3 -> pending. WBC 17-> 28 -> pending (on steroids)   Awake and alert off vent. Denies SOB. Denies pain. Appetite slowly improving.   Echo 07/22/18: EF 25-30%, global HK, RV normal function, increased wall thickness, LA moderately dilated, RA severely dilated  Objective:   Weight Range: 106.9 kg Body mass index is 38.04 kg/m.   Vital Signs:   Temp:  [97.7 F (36.5 C)-98.5 F (36.9 C)] 98.5 F (36.9 C) (02/21 0746) Pulse Rate:  [93-125] 114 (02/21 0808) Resp:  [19-42] 29 (02/21 0808) BP: (92-140)/(60-121) 106/82 (02/21 0808) SpO2:  [87 %-98 %] 94 % (02/21 0808) FiO2 (%):  [50 %] 50 % (02/21 0316) Weight:  [106.9 kg] 106.9 kg (02/21 0500) Last BM Date: 07/21/18  Weight change: Filed Weights   07/30/18 0312 07/31/18 0500 08/01/18 0500  Weight: 104.9 kg 105.5 kg 106.9 kg   Intake/Output:   Intake/Output Summary (Last 24 hours) at 08/01/2018 0812 Last data filed at 08/01/2018 0403 Gross per 24 hour  Intake 760 ml  Output 1350 ml  Net -590 ml    Physical Exam    General:Lying in bed. NAD.  HEENT: Normal Neck: Supple. JVP ~8-9. Carotids 2+ bilat; no bruits. No thyromegaly or nodule noted. Cor: PMI nondisplaced. RRR, No M/G/R noted Lungs: Diminished throughout.  Abdomen: Soft, non-tender, non-distended, no HSM. No bruits or masses. +BS  Extremities: No cyanosis, clubbing, or rash. No BLE edema.  Neuro: Alert & orientedx3, cranial nerves grossly intact. moves all 4 extremities w/o difficulty. Affect flat, but appropriate.   Telemetry   NSR/Sinus tach 90-100s with ectopy, personally reviewed.   EKG    No new tracings.    Labs    CBC Recent Labs    07/30/18 0429 07/31/18 0411  WBC 17.1* 28.3*  NEUTROABS 14.5* 25.2*  HGB 14.8 15.0    HCT 52.2* 52.6*  MCV 90.6 90.5  PLT 230 767   Basic Metabolic Panel Recent Labs    07/30/18 0429 07/31/18 0411  NA 145 145  K 3.9 4.3  CL 105 107  CO2 31 27  GLUCOSE 118* 134*  BUN 77* 98*  CREATININE 1.44* 2.28*  CALCIUM 9.7 9.6  MG 2.2 2.4  PHOS 5.1* 5.9*   Liver Function Tests Recent Labs    07/30/18 0429  AST 22  ALT 13  ALKPHOS 42  BILITOT 1.7*  PROT 6.2*  ALBUMIN 3.1*   No results for input(s): LIPASE, AMYLASE in the last 72 hours. Cardiac Enzymes No results for input(s): CKTOTAL, CKMB, CKMBINDEX, TROPONINI in the last 72 hours.  BNP: BNP (last 3 results) Recent Labs    07/21/18 1123  BNP 964.4*   ProBNP (last 3 results) No results for input(s): PROBNP in the last 8760 hours.  D-Dimer No results for input(s): DDIMER in the last 72 hours. Hemoglobin A1C No results for input(s): HGBA1C in the last 72 hours. Fasting Lipid Panel No results for input(s): CHOL, HDL, LDLCALC, TRIG, CHOLHDL, LDLDIRECT in the last 72 hours. Thyroid Function Tests No results for input(s): TSH, T4TOTAL, T3FREE, THYROIDAB in the last 72 hours.  Invalid input(s): FREET3  Other results:  Imaging    No results found.  Medications:     Scheduled Medications: . aspirin  81 mg Oral Daily  . chlorhexidine gluconate (MEDLINE KIT)  15 mL Mouth Rinse BID  . heparin  5,000 Units Subcutaneous Q8H  . insulin aspart  0-15 Units Subcutaneous Q4H  . ipratropium  0.5 mg Nebulization TID  . levalbuterol  1.25 mg Nebulization TID  . mouth rinse  15 mL Mouth Rinse BID  . pantoprazole  40 mg Oral Daily  . polyethylene glycol  17 g Oral Daily  . ENSURE MAX PROTEIN  11 oz Oral BID  . sennosides  5 mL Oral QHS  . sodium chloride flush  10-40 mL Intracatheter Q12H  . sodium chloride flush  3 mL Intravenous Q12H  . spironolactone  12.5 mg Oral QHS    Infusions: . sodium chloride    . 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   Assessment/Plan  1. Acute on chronic systolic/diastolic HF - Echo 9/44/46 EF 45%. Cath with minimal CAD - Echo 07/22/18: EF 25-30%, global HK, RV normal function, increased wall thickness, LA moderately dilated, RA severely dilated - CVP 8-9cm - Creatinine 1.2 -> 1.4 -> 2.28 -> 1.43  Will restart po lasix.  - Off Entresto with AKI and low BP.  - Holding spiro with AKI.  - Coox 72.2% this am.  - This admit mostly secondary to COPD flare but likely component of HF. With lower EF Plan left and RHC prior to d/c to measure pulmonary pressures and reassess for CAD. Likely Monday when renal function better.   2. Acute hypoxic respiratory failure - Likely combination of HF and AECOPD. CXR today with low volume chest with presumed atelectasis at the bases.  - Extubated 2/19. CCM managing.   3. Undiagnosed OSA - Will need outpatient sleep study.Will likely need to set him up with BiPAP prior to DC. No change.   4. DM2 - Per primary team - Consider SGLT2i at d/c. No change.   5. AKI on CKD III - Cr 1.44 -> 2.28 -> pending. Follow.   Shirley Friar, PA-C  8:12 AM  Advanced Heart Failure Team Pager [phone] (M-F; 7a - 4p)  Please contact [facility] for night-coverage after hours (4p -7a ) and weekends on [email or web address]  Patient seen and examined with the above-signed Advanced Practice Provider and/or Housestaff. I personally reviewed laboratory data, imaging studies and relevant notes. I independently examined the patient and formulated the important aspects of the plan. I have edited the note to reflect any of my changes or salient points. I have  personally discussed the plan with the patient and/or family.  Off vent. Respiratory status stable. Lasix on hold. CVP 8-9. Creatinine back down to 1.43 (suspect yesterday's result was spurious). Suspect main issue is OSA/OHS. Will plan R/L cath on Monday to assess drop in EF. Restart po lasix 40 daily. I will see again Monday. Call over the weekend with questions.   Glori Bickers, MD  11:11 AM

## 2018-08-01 NOTE — Progress Notes (Signed)
RT placed patient on BiPAP.  RN stated patient has not eaten in at least 30 min.  Patient resting well.

## 2018-08-01 NOTE — Progress Notes (Signed)
[facility]   NAME:  Kristopher [name], MRN:  [id number], DOB:  1950/06/15, LOS: 11 ADMISSION DATE:  07/21/2018, CONSULTATION DATE:   REFERRING MD:  CHIEF COMPLAINT:  Respiratory Failure  Brief History   68 y/o M admitted 2/10 with progressive SOB and swelling. Had approximately 15kg weight gain.  Known mixed HFrEF and diastolic heart function with COPD/Tobacco abuse. Admitted to floor initially, then transferred 2/14 to SDU secondary to hypercapnia and encephalopathy. Started on BiPAP, continued on diuretics including diamox.Steroids discontinued. Felt to be heart failure related.  Diuresed.  Extubated 2/19.    Interim history/subjective:  No acute events.  Remains comfortable on [place].  CVP 9, co-ox 72  Significant Hospital Events   2/10  Admit  2/14  Tx to SDU > ICU  2/15  Intubated, hypotension with diuresis / vasopressors, AKI, CAP rx.  2/15  Self extubated / re-intubated 2/16  Intermittent agitation, responded to volume, off pressors.   2/19  Extubated   Past Medical History  Combined Systolic / Diastolic CHF  CAD HTN LBBB Asthma / COPD  Tobacco Abuse  Pulmonary HTN PUD   Consults:  Cardiology / CHF  PCCM   Procedures:  L IJ TLC 2/15 >>  ETT 2/15 >> 2/18  Significant Diagnostic Tests:  ECHO 2/11 >> LVEF severely reduced 25-30%, RVSP ~28.6, LA moderately dilated, RA severely dilated, grade I diastolic dysfunction   Micro Data:  RVP 2/15 >> negative  Sputum 2/15 >> moderate diptheroids   Antimicrobials:  Doxycycline 2/15 >> Azithromycin 2/15 >> 2/17  Objective   Blood pressure (!) 132/111, pulse (!) 104, temperature 98.5 F (36.9 C), temperature source Oral, resp. rate (!) 33, height 5\' 6"  (1.676 m), weight 106.9 kg, SpO2 97 %. CVP:  [8 mmHg-9 mmHg] 9 mmHg  Vent Mode: BIPAP FiO2 (%):  [50 %] 50 % Set Rate:  [8 bmp] 8 bmp PEEP:  [6 cmH20] 6 cmH20 Plateau Pressure:  [15 cmH20] 15 cmH20   Intake/Output Summary (Last 24 hours) at  08/01/2018 1118 Last data filed at 08/01/2018 0403 Gross per 24 hour  Intake 760 ml  Output 1350 ml  Net -590 ml   Filed Weights   07/30/18 0312 07/31/18 0500 08/01/18 0500  Weight: 104.9 kg 105.5 kg 106.9 kg    Examination: General: adult male sitting up in bed watching TV in NAD  HEENT: MM pink/moist, [place] O2, EOMI Neuro: A&O x 3, MAE CV: RRR, no M/R/G PULM: even/non-labored, lungs bilaterally clear anterior, diminished bases  QM:VHQI, non-tender, bsx4 active  Extremities: warm/dry, no significant edema  Skin: no rashes or lesions  Assessment & Plan:   Chronic Combined Systolic / Diastolic CHF, Dilated CHF  P: CHF service following, appreciate assistance  Diuresis per CHF Continue spironolactone, ASA Hold home ACE-I R / LHC planned for Monday 2/24  Sepsis with Shock, resolved  -component of diuresis / hypovolemia P: ABX as above, stop date added for doxy  Follow fever curve / wbc trend  Remove foley, continue strict I/O's   Acute on Chronic Hypoxemic / Hypercarbic Respiratory Failure  -in setting of CHF, Exacerbation of COPD, +/- PNA, probable OSA / OHS AECOPD  ? PNA - no infiltrate on CXR, though has diphtheroids in sputum  P: Pulmonary hygiene-IS, mobilize  PRN BiPAP for increased WOB  Continue xopenex + atrovent  D/c dilaudid  Suspected OSA P: Will need outpatient sleep study   Deconditioning  Acute Metabolic Encephalopathy - resolved P: PT efforts  At Risk Malnutrition  P: Diet as tolerated SLP following   Pulmonary HTN P: R/ LHC Monday 2/24 per cards  Constipation  Slightly distended abdomen P: PRN regimen, mobilization     Best practice:  Diet: D3 diet  Pain/Anxiety/Delirium protocol (if indicated): n/a VAP protocol (if indicated): n/a DVT prophylaxis: Heparin sq GI prophylaxis: n/a Glucose control: CBG monitoring  Mobility: As tolerated  Code Status: Full Family Communication: No family available 2/20.  Pt updated on plan of  care.   Disposition: Transfer out of ICU today to med surge.  Will ask TRH to assume care starting in AM 2022/08/31 with PCCM off at that time.    Rutherford Guys, PA [facility] Pager: [phone].  If no answer, [phone] 08/01/2018, 12:06 PM

## 2018-08-02 DIAGNOSIS — I10 Essential (primary) hypertension: Secondary | ICD-10-CM

## 2018-08-02 DIAGNOSIS — J9622 Acute and chronic respiratory failure with hypercapnia: Secondary | ICD-10-CM

## 2018-08-02 LAB — CBC WITH DIFFERENTIAL/PLATELET
Abs Immature Granulocytes: 0.16 10*3/uL — ABNORMAL HIGH (ref 0.00–0.07)
Basophils Absolute: 0 10*3/uL (ref 0.0–0.1)
Basophils Relative: 0 %
Eosinophils Absolute: 0 10*3/uL (ref 0.0–0.5)
Eosinophils Relative: 0 %
HCT: 45.4 % (ref 39.0–52.0)
Hemoglobin: 12.9 g/dL — ABNORMAL LOW (ref 13.0–17.0)
Immature Granulocytes: 1 %
Lymphocytes Relative: 4 %
Lymphs Abs: 1 10*3/uL (ref 0.7–4.0)
MCH: 26 pg (ref 26.0–34.0)
MCHC: 28.4 g/dL — ABNORMAL LOW (ref 30.0–36.0)
MCV: 91.3 fL (ref 80.0–100.0)
MONO ABS: 1.9 10*3/uL — AB (ref 0.1–1.0)
Monocytes Relative: 8 %
Neutro Abs: 20.4 10*3/uL — ABNORMAL HIGH (ref 1.7–7.7)
Neutrophils Relative %: 87 %
Platelets: 264 10*3/uL (ref 150–400)
RBC: 4.97 MIL/uL (ref 4.22–5.81)
RDW: 19.8 % — ABNORMAL HIGH (ref 11.5–15.5)
WBC: 23.5 10*3/uL — ABNORMAL HIGH (ref 4.0–10.5)
nRBC: 0 % (ref 0.0–0.2)

## 2018-08-02 LAB — BASIC METABOLIC PANEL
Anion gap: 10 (ref 5–15)
BUN: 49 mg/dL — ABNORMAL HIGH (ref 8–23)
CO2: 32 mmol/L (ref 22–32)
CREATININE: 1.04 mg/dL (ref 0.61–1.24)
Calcium: 9.4 mg/dL (ref 8.9–10.3)
Chloride: 105 mmol/L (ref 98–111)
GFR calc non Af Amer: >60 mL/min (ref >60)
Glucose, Bld: 108 mg/dL — ABNORMAL HIGH (ref 70–99)
Potassium: 3.3 mmol/L — ABNORMAL LOW (ref 3.5–5.1)
Sodium: 147 mmol/L — ABNORMAL HIGH (ref 135–145)

## 2018-08-02 LAB — GLUCOSE, CAPILLARY
Glucose-Capillary: 150 mg/dL — ABNORMAL HIGH (ref 70–99)
Glucose-Capillary: 98 mg/dL (ref 70–99)
Glucose-Capillary: 108 mg/dL — ABNORMAL HIGH (ref 70–99)
Glucose-Capillary: 126 mg/dL — ABNORMAL HIGH (ref 70–99)

## 2018-08-02 LAB — PHOSPHORUS: Phosphorus: 3.6 mg/dL (ref 2.5–4.6)

## 2018-08-02 LAB — MAGNESIUM: Magnesium: 2.3 mg/dL (ref 1.7–2.4)

## 2018-08-02 MED ORDER — LEVALBUTEROL HCL 1.25 MG/0.5ML IN NEBU
0.6300 mg | INHALATION_SOLUTION | Freq: Three times a day (TID) | RESPIRATORY_TRACT | Status: DC
  Administered 2018-08-02 – 2018-08-04 (×6): 0.63 mg via RESPIRATORY_TRACT
  Administered 2018-08-04 – 2018-08-05 (×3): 1.25 mg via RESPIRATORY_TRACT
  Administered 2018-08-06 – 2018-08-07 (×4): 0.63 mg via RESPIRATORY_TRACT
  Filled 2018-08-02 (×15): qty 0.5

## 2018-08-02 MED ORDER — POTASSIUM CHLORIDE CRYS ER 20 MEQ PO TBCR
40.0000 meq | EXTENDED_RELEASE_TABLET | Freq: Once | ORAL | Status: AC
  Administered 2018-08-02: 40 meq via ORAL
  Filled 2018-08-02: qty 2

## 2018-08-02 NOTE — Progress Notes (Addendum)
PROGRESS NOTE   Kristopher [name]  QJF:[id number]    DOB: 1951/01/07    DOA: 07/21/2018  PCP: Maurice Small, MD   I have briefly reviewed patients previous medical records in [facility].  Brief Narrative:  68 year old male, PMH of dilated cardiomyopathy, chronic combined systolic and diastolic CHF, pulmonary hypertension, CAD, HTN, LBBB, asthma/COPD, esophagitis/PUD/upper GI bleed, prediabetes, presented to ED on 07/21/2018 due to progressive weight gain and dyspnea.  He was initially admitted to the floor for decompensated CHF.  On 2/14, he decompensated with hypercapnia and acute encephalopathy, transferred to stepdown and started on BiPAP but had to be transferred to ICU for intubation and mechanical ventilation.  CCM took over care.  Advanced HF team consulted.  Extubated 2/19.  Improved and stabilized and transferred to progressive care unit under [facility] care on 2/22.  Cardiology considering right and left heart cath on 2/24.   Assessment & Plan:   Principal Problem:   Acute on chronic combined systolic and diastolic congestive heart failure, NYHA class 4 (HCC) Active Problems:   DOE (dyspnea on exertion)   Chronic combined systolic and diastolic heart failure (HCC)   HTN (hypertension)   DCM (dilated cardiomyopathy) (HCC)   PUD (peptic ulcer disease)   Pulmonary HTN ([place])   1. Acute on chronic combined systolic and diastolic CHF: Advanced HF team consulting and assisting with care.  TTE 07/22/2018: LVEF 25-30%.  Treated early on in admission with IV Lasix, Diamox and metolazone.  -11.6 L since admission.  Currently on Lasix 40 mg daily and Aldactone 12.5 mg at bedtime.  Off Entresto due to recent acute kidney injury and low blood pressures.  As per cardiology, due to new low EF, plan left and right heart cath prior to DC to measure pulmonary pressures and reassess for CAD, possibly for 2/24 pending improvement in renal insufficiency.  CVP 6 today. 2. Acute on chronic hypoxic and hypercapnic  respiratory failure: Likely multifactorial, COPD exacerbation, decompensated CHF, questionable pneumonia over probable OSA/OHS.  Extubated 2/19.  Aggressive pulmonary toilet.  PRN BiPAP.  Minimize opioids.  Tracheal aspirate culture showed diphtheroids.  Has completed 7 days course of antibiotics (azithromycin/doxycycline)-discontinued. 3. Suspected OSA/OHS: Will need outpatient sleep study. 4. Acute on stage III chronic kidney disease: Last known baseline creatinine in May 2018 was in 1.1-1.2 range.  Presented with creatinine of 1.5.  This peaked to 2.70 this admission.  Creatinine has normalized/1.04 today.  Follow BMP closely. 5. Acute metabolic encephalopathy: Resolved. 6. Prediabetes: A1c 6.  CBGs well controlled in the hospital and not requiring much insulins.  DC CBG checks and SSI.  Outpatient follow-up. 7. Essential hypertension: Controlled.  Currently not on antihypertensives. 8. History of PUD/esophagitis/upper GI bleed: Continue PPI. 9. Deconditioning: PT and OT recommend SNF pending clinical improvement. 10. Pulmonary hypertension: Cardiology plans right and left heart cath on 2/24. 11. Constipation: Aggressive bowel regimen. 12. Leukocytosis: May be due to recent steroids.  No overt infectious etiology.  Continues to improve.  Follow CBCs. 13. Hypernatremia: Related to recent aggressive diuresis.  Follow BMP. 14. Hypokalemia: Replace and follow.  Magnesium normal.   DVT prophylaxis: Subcutaneous heparin Code Status: Full Family Communication: None at bedside Disposition: DC to SNF pending clinical improvement, possibly mid next week   Consultants:  PCCM Cardiology  Procedures:  ECHO 2/11 >> LVEF severely reduced 25-30%, RVSP ~28.6, LA moderately dilated, RA severely dilated, grade I diastolic dysfunction  Central line: RN discussed with cardiology on 2/22 who wished to keep the  central line at this time.  He is undergoing CVP checks.  Antimicrobials:  Doxycycline 2/15  >> Azithromycin 2/15 >> 2/17   Subjective: Patient frustrated because he feels he is now diabetic because he is getting insulins.  States that he was never diabetic before admission and did not take medications for same.  No history of oxygen use or CPAP/BiPAP PTA.  Was independent.  Remote former smoker.  Overall feels better.  Dyspnea improved but breathing not yet at baseline.  No pain reported.  ROS: As above, otherwise negative  Objective:  Vitals:   08/02/18 0421 08/02/18 0734 08/02/18 0843 08/02/18 1123  BP:   116/72 117/83  Pulse:   (!) 105 (!) 109  Resp:      Temp:   (!) 97.5 F (36.4 C) (!) 97.5 F (36.4 C)  TempSrc:   Oral Oral  SpO2: 94% 95% 92% 95%  Weight:      Height:        Examination:  General exam: Pleasant middle-age male, moderately built and nourished sitting up comfortably at edge of bed this morning without distress. Respiratory system: Occasional basal crackles but otherwise clear to auscultation. Respiratory effort normal. Cardiovascular system: S1 & S2 heard, RRR. No JVD, murmurs, rubs, gallops or clicks.  Trace bilateral ankle edema.  Telemetry personally reviewed: ST in the 100s, BBB morphology and occasional PVCs. Gastrointestinal system: Abdomen is nondistended, soft and nontender. No organomegaly or masses felt. Normal bowel sounds heard. Central nervous system: Alert and oriented. No focal neurological deficits. Extremities: Symmetric 5 x 5 power. Skin: No rashes, lesions or ulcers Psychiatry: Judgement and insight appear normal. Mood & affect appropriate.     Data Reviewed: I have personally reviewed following labs and imaging studies  CBC: Recent Labs  Lab 07/29/18 0335 07/30/18 0429 07/31/18 0411 08/01/18 0826 08/02/18 0620  WBC 16.7* 17.1* 28.3* 26.7* 23.5*  NEUTROABS 14.5* 14.5* 25.2* 23.9* 20.4*  HGB 14.4 14.8 15.0 13.7 12.9*  HCT 49.9 52.2* 52.6* 48.4 45.4  MCV 89.6 90.6 90.5 90.6 91.3  PLT 238 230 214 220 419   Basic  Metabolic Panel: Recent Labs  Lab 07/29/18 0335 07/30/18 0429 07/31/18 0411 08/01/18 0826 08/02/18 0600  NA 142 145 145 146* 147*  K 4.0 3.9 4.3 3.8 3.3*  CL 103 105 107 107 105  CO2 _0 32  GLUCOSE 135* 118* 134* 172* 108*  BUN 57* 77* 98* 80* 49*  CREATININE 1.26* 1.44* 2.28* 1.43* 1.04  CALCIUM 9.8 9.7 9.6 9.6 9.4  MG 2.3 2.2 2.4 2.4 2.3  PHOS 4.2 5.1* 5.9* 3.0 3.6   Liver Function Tests: Recent Labs  Lab 07/30/18 0429  AST 22  ALT 13  ALKPHOS 42  BILITOT 1.7*  PROT 6.2*  ALBUMIN 3.1*   CBG: Recent Labs  Lab 08/01/18 1208 08/01/18 1549 08/01/18 2113 08/02/18 0744 08/02/18 1119  GLUCAP 118* 127* 88 150* 98    Recent Results (from the past 240 hour(s))  MRSA PCR Screening     Status: None   Collection Time: 07/25/18 10:25 AM  Result Value Ref Range Status   MRSA by PCR NEGATIVE NEGATIVE Final    Comment:        The GeneXpert MRSA Assay (FDA approved for NASAL specimens only), is one component of a comprehensive MRSA colonization surveillance program. It is not intended to diagnose MRSA infection nor to guide or monitor treatment for MRSA infections. Performed at [facility], [place] [address]  [place], [place] [address]   Culture, respiratory (non-expectorated)     Status: None   Collection Time: 07/26/18  1:26 PM  Result Value Ref Range Status   Specimen Description TRACHEAL ASPIRATE  Final   Special Requests Normal  Final   Gram Stain   Final    ABUNDANT WBC PRESENT, PREDOMINANTLY PMN RARE SQUAMOUS EPITHELIAL CELLS PRESENT MODERATE GRAM POSITIVE RODS RARE GRAM POSITIVE COCCI IN PAIRS RARE GRAM NEGATIVE RODS    Culture   Final    MODERATE DIPHTHEROIDS(CORYNEBACTERIUM SPECIES) Standardized susceptibility testing for this organism is not available. Performed at [facility], [place] [address], [place], [place] [address]    Report Status 07/29/2018 FINAL  Final  Respiratory Panel by PCR     Status: None   Collection Time:  07/26/18  5:32 PM  Result Value Ref Range Status   Adenovirus NOT DETECTED NOT DETECTED Final   Coronavirus 229E NOT DETECTED NOT DETECTED Final    Comment: (NOTE) The Coronavirus on the Respiratory Panel, DOES NOT test for the novel  Coronavirus (2019 nCoV)    Coronavirus HKU1 NOT DETECTED NOT DETECTED Final   Coronavirus NL63 NOT DETECTED NOT DETECTED Final   Coronavirus OC43 NOT DETECTED NOT DETECTED Final   Metapneumovirus NOT DETECTED NOT DETECTED Final   Rhinovirus / Enterovirus NOT DETECTED NOT DETECTED Final   Influenza A NOT DETECTED NOT DETECTED Final   Influenza B NOT DETECTED NOT DETECTED Final   Parainfluenza Virus 1 NOT DETECTED NOT DETECTED Final   Parainfluenza Virus 2 NOT DETECTED NOT DETECTED Final   Parainfluenza Virus 3 NOT DETECTED NOT DETECTED Final   Parainfluenza Virus 4 NOT DETECTED NOT DETECTED Final   Respiratory Syncytial Virus NOT DETECTED NOT DETECTED Final   Bordetella pertussis NOT DETECTED NOT DETECTED Final   Chlamydophila pneumoniae NOT DETECTED NOT DETECTED Final   Mycoplasma pneumoniae NOT DETECTED NOT DETECTED Final    Comment: Performed at [facility], [place]. [address], [place], [place] [address]         Radiology Studies: No results found.      Scheduled Meds: . aspirin  81 mg Oral Daily  . chlorhexidine gluconate (MEDLINE KIT)  15 mL Mouth Rinse BID  . furosemide  40 mg Oral Daily  . heparin  5,000 Units Subcutaneous Q8H  . insulin aspart  0-15 Units Subcutaneous Q4H  . ipratropium  0.5 mg Nebulization TID  . levalbuterol  1.25 mg Nebulization TID  . mouth rinse  15 mL Mouth Rinse BID  . pantoprazole  40 mg Oral Daily  . polyethylene glycol  17 g Oral Daily  . ENSURE MAX PROTEIN  11 oz Oral BID  . sennosides  5 mL Oral QHS  . sodium chloride flush  10-40 mL Intracatheter Q12H  . sodium chloride flush  3 mL Intravenous Q12H  . spironolactone  12.5 mg Oral QHS   Continuous Infusions: . sodium chloride    . doxycycline  (VIBRAMYCIN) IV 100 mg (08/02/18 0525)     LOS: 12 days     Vernell Leep, MD, FACP, [facility]. [facility]  To contact the attending provider between 7A-7P or the covering provider during after hours 7P-7A, please log into the web site [email or web address] and access using universal [place] password for that web site. If you do not have the password, please call the hospital operator.  08/02/2018, 11:59 AM

## 2018-08-02 NOTE — Plan of Care (Signed)
  Problem: Clinical Measurements: Goal: Will remain free from infection Outcome: Progressing Note:  No s/s of infection noted. Goal: Respiratory complications will improve Outcome: Progressing Note:  No s/s of respiratory complications noted.  Stable on 4-5L/[place].  On Bipap at night.

## 2018-08-03 LAB — CBC WITH DIFFERENTIAL/PLATELET
Abs Immature Granulocytes: 0.17 10*3/uL — ABNORMAL HIGH (ref 0.00–0.07)
BASOS PCT: 0 %
Basophils Absolute: 0 10*3/uL (ref 0.0–0.1)
Eosinophils Absolute: 0.1 10*3/uL (ref 0.0–0.5)
Eosinophils Relative: 0 %
HCT: 46.1 % (ref 39.0–52.0)
Hemoglobin: 12.9 g/dL — ABNORMAL LOW (ref 13.0–17.0)
Immature Granulocytes: 1 %
Lymphocytes Relative: 6 %
Lymphs Abs: 1.2 10*3/uL (ref 0.7–4.0)
MCH: 25.3 pg — ABNORMAL LOW (ref 26.0–34.0)
MCHC: 28 g/dL — ABNORMAL LOW (ref 30.0–36.0)
MCV: 90.4 fL (ref 80.0–100.0)
MONO ABS: 1.6 10*3/uL — AB (ref 0.1–1.0)
MONOS PCT: 8 %
Neutro Abs: 16.6 10*3/uL — ABNORMAL HIGH (ref 1.7–7.7)
Neutrophils Relative %: 85 %
PLATELETS: 263 10*3/uL (ref 150–400)
RBC: 5.1 MIL/uL (ref 4.22–5.81)
RDW: 19.2 % — ABNORMAL HIGH (ref 11.5–15.5)
WBC: 19.6 10*3/uL — AB (ref 4.0–10.5)
nRBC: 0 % (ref 0.0–0.2)

## 2018-08-03 LAB — BASIC METABOLIC PANEL
Anion gap: 11 (ref 5–15)
BUN: 32 mg/dL — AB (ref 8–23)
CO2: 29 mmol/L (ref 22–32)
Calcium: 9.4 mg/dL (ref 8.9–10.3)
Chloride: 104 mmol/L (ref 98–111)
Creatinine, Ser: 1.01 mg/dL (ref 0.61–1.24)
GFR calc Af Amer: >60 mL/min (ref >60)
GFR calc non Af Amer: >60 mL/min (ref >60)
Glucose, Bld: 125 mg/dL — ABNORMAL HIGH (ref 70–99)
POTASSIUM: 3.9 mmol/L (ref 3.5–5.1)
Sodium: 144 mmol/L (ref 135–145)

## 2018-08-03 LAB — GLUCOSE, CAPILLARY
GLUCOSE-CAPILLARY: 110 mg/dL — AB (ref 70–99)
Glucose-Capillary: 135 mg/dL — ABNORMAL HIGH (ref 70–99)

## 2018-08-03 LAB — PHOSPHORUS: Phosphorus: 3.7 mg/dL (ref 2.5–4.6)

## 2018-08-03 LAB — MAGNESIUM: Magnesium: 2.1 mg/dL (ref 1.7–2.4)

## 2018-08-03 MED ORDER — ALTEPLASE 2 MG IJ SOLR
2.0000 mg | Freq: Once | INTRAMUSCULAR | Status: AC
  Administered 2018-08-03: 2 mg
  Filled 2018-08-03: qty 2

## 2018-08-03 MED ORDER — SODIUM CHLORIDE 0.9 % IV SOLN: INTRAVENOUS | Status: DC

## 2018-08-03 MED ORDER — SODIUM CHLORIDE 0.9 % IV SOLN: 250.0000 mL | INTRAVENOUS | Status: DC | PRN

## 2018-08-03 MED ORDER — ASPIRIN 81 MG PO CHEW
81.0000 mg | CHEWABLE_TABLET | ORAL | Status: AC
  Administered 2018-08-04: 81 mg via ORAL
  Filled 2018-08-03: qty 1

## 2018-08-03 MED ORDER — SODIUM CHLORIDE 0.9% FLUSH: 3.0000 mL | INTRAVENOUS | Status: DC | PRN

## 2018-08-03 MED ORDER — SODIUM CHLORIDE 0.9% FLUSH
3.0000 mL | Freq: Two times a day (BID) | INTRAVENOUS | Status: DC
  Administered 2018-08-03: 3 mL via INTRAVENOUS

## 2018-08-03 NOTE — NC FL2 (Signed)
[place] LEVEL OF CARE SCREENING TOOL     IDENTIFICATION  Patient Name: Kristopher [name] Birthdate: 04-Jan-1951 Sex: male Admission Date (Current Location): 07/21/2018  [facility] and [place] Number:  Herbalist and Address:  [facility], [place] [address] [place], [place] [address]      Provider Number: [id number]  Attending Physician Name and Address:  Modena Jansky, MD  Relative Name and Phone Number:  Emeterio Balke, son, [phone]    Current Level of Care: Hospital Recommended Level of Care: [place] Prior Approval Number:    Date Approved/Denied:   PASRR Number: [id number]  Discharge Plan: SNF    Current Diagnoses: Patient Active Problem List   Diagnosis Date Noted  . Acute on chronic combined systolic and diastolic congestive heart failure, NYHA class 4 ([place]) 07/21/2018  . PUD (peptic ulcer disease) 08/17/2016  . Acute esophagitis 08/17/2016  . Pulmonary HTN ([place]) 08/17/2016  . Lymphadenopathy 08/17/2016  . CAD (coronary artery disease) 08/16/2016  . Anemia   . DCM (dilated cardiomyopathy) ([place])   . HTN (hypertension)   . Chronic combined systolic and diastolic heart failure ([place]) 08/11/2016  . Acute blood loss anemia 08/11/2016  . Acute upper GI bleed 08/10/2016  . DOE (dyspnea on exertion) 08/10/2016  . LBBB (left bundle branch block) 08/10/2016    Orientation RESPIRATION BLADDER Height & Weight     Self, Time, Situation, Place  O2, Other (Comment)(nasal cannula 4L; CPAP) Continent Weight: 94.5 kg Height:  _0  (167.6 cm)  BEHAVIORAL SYMPTOMS/MOOD NEUROLOGICAL BOWEL NUTRITION STATUS      Continent Diet(please see DC summary)  AMBULATORY STATUS COMMUNICATION OF NEEDS Skin   Extensive Assist Verbally Normal                       Personal Care Assistance Level of Assistance  Bathing, Feeding, Dressing Bathing Assistance: Limited assistance Feeding assistance: Independent Dressing  Assistance: Limited assistance     Functional Limitations Info  Sight, Hearing, Speech Sight Info: Adequate Hearing Info: Adequate Speech Info: Adequate    SPECIAL CARE FACTORS FREQUENCY  PT (By licensed PT), OT (By licensed OT)     PT Frequency: 5x/week OT Frequency: 5x/week            Contractures Contractures Info: Not present    Additional Factors Info  Code Status, Allergies Code Status Info: Full Allergies Info: Penicillins           Current Medications (08/03/2018):  This is the current hospital active medication list Current Facility-Administered Medications  Medication Dose Route Frequency Provider Last Rate Last Dose  . 0.9 %  sodium chloride infusion  250 mL Intravenous PRN Georgiana Shore, NP      . 0.9 %  sodium chloride infusion  250 mL Intravenous PRN Bensimhon, Shaune Pascal, MD      . Derrill Memo ON 08/04/2018] 0.9 %  sodium chloride infusion   Intravenous Continuous Bensimhon, Shaune Pascal, MD      . acetaminophen (TYLENOL) tablet 650 mg  650 mg Oral Q4H PRN Georgiana Shore, NP   650 mg at 07/30/18 9432  . aspirin chewable tablet 81 mg  81 mg Oral Daily Margaretha Seeds, MD   81 mg at 08/03/18 0844  . [START ON 08/04/2018] aspirin chewable tablet 81 mg  81 mg Oral Pre-Cath Bensimhon, Shaune Pascal, MD      . bisacodyl (DULCOLAX) suppository 10 mg  10  mg Rectal Daily PRN Margaretha Seeds, MD   10 mg at 07/30/18 0804  . chlorhexidine gluconate (MEDLINE KIT) (PERIDEX) 0.12 % solution 15 mL  15 mL Mouth Rinse BID Jeananne Rama, DO   15 mL at 08/01/18 2019  . furosemide (LASIX) tablet 40 mg  40 mg Oral Daily Bensimhon, Shaune Pascal, MD   40 mg at 08/03/18 0843  . guaiFENesin-dextromethorphan (ROBITUSSIN DM) 100-10 MG/5ML syrup 15 mL  15 mL Oral Q4H PRN Georgiana Shore, NP   15 mL at 07/26/18 2315  . heparin injection 5,000 Units  5,000 Units Subcutaneous Q8H Georgiana Shore, NP   5,000 Units at 08/03/18 3735  . ipratropium (ATROVENT) nebulizer solution 0.5 mg  0.5 mg  Nebulization TID Margaretha Seeds, MD   0.5 mg at 08/03/18 0740  . levalbuterol (XOPENEX) nebulizer solution 0.63 mg  0.63 mg Nebulization Q6H PRN Agarwala, Einar Grad, MD      . levalbuterol (XOPENEX) nebulizer solution 0.63 mg  0.63 mg Nebulization TID Modena Jansky, MD   0.63 mg at 08/03/18 0741  . MEDLINE mouth rinse  15 mL Mouth Rinse BID Margaretha Seeds, MD   15 mL at 08/02/18 2108  . ondansetron (ZOFRAN) injection 4 mg  4 mg Intravenous Q6H PRN Georgiana Shore, NP      . pantoprazole (PROTONIX) EC tablet 40 mg  40 mg Oral Daily Margaretha Seeds, MD   40 mg at 08/03/18 0843  . polyethylene glycol (MIRALAX / GLYCOLAX) packet 17 g  17 g Oral Daily Margaretha Seeds, MD   17 g at 08/02/18 0902  . protein supplement (ENSURE MAX) liquid  11 oz Oral BID Margaretha Seeds, MD   11 oz at 08/02/18 2108  . sennosides (SENOKOT) 8.8 MG/5ML syrup 5 mL  5 mL Oral QHS Ollis, Brandi L, NP   5 mL at 08/02/18 2108  . sodium chloride flush (NS) 0.9 % injection 10-40 mL  10-40 mL Intracatheter Q12H Margaretha Seeds, MD   10 mL at 08/02/18 2109  . sodium chloride flush (NS) 0.9 % injection 10-40 mL  10-40 mL Intracatheter PRN Margaretha Seeds, MD      . sodium chloride flush (NS) 0.9 % injection 3 mL  3 mL Intravenous Q12H Georgiana Shore, NP   3 mL at 08/01/18 2217  . sodium chloride flush (NS) 0.9 % injection 3 mL  3 mL Intravenous PRN Georgiana Shore, NP   3 mL at 08/01/18 7897  . sodium chloride flush (NS) 0.9 % injection 3 mL  3 mL Intravenous Q12H Bensimhon, Shaune Pascal, MD      . sodium chloride flush (NS) 0.9 % injection 3 mL  3 mL Intravenous PRN Bensimhon, Shaune Pascal, MD      . spironolactone (ALDACTONE) tablet 12.5 mg  12.5 mg Oral QHS Shirley Friar, PA-C   12.5 mg at 08/02/18 2109     Discharge Medications: Please see discharge summary for a list of discharge medications.  Relevant Imaging Results:  Relevant Lab Results:   Additional Information SSN: [id number]  [name],  LCSW

## 2018-08-03 NOTE — Progress Notes (Signed)
  Chart reviewed. Patient and labs stable. Will plan right/left heart cath in am tomorrow as I discussed with him on Friday. Orders placed.   Arvilla Meres, MD  7:59 AM

## 2018-08-03 NOTE — Clinical Social Work Note (Signed)
Clinical Social Work Assessment  Patient Details  Name: Kristopher [name] MRN: [id number] Date of Birth: 09-07-1950  Date of referral:  08/03/18               Reason for consult:  Facility Placement, Discharge Planning                Permission sought to share information with:  Facility Art therapist granted to share information::  Yes, Verbal Permission Granted  Name::        Agency::  SNFs  Relationship::     Contact Information:     Housing/Transportation Living arrangements for the past 2 months:  Apartment Source of Information:  Patient Patient Interpreter Needed:  None Criminal Activity/Legal Involvement Pertinent to Current Situation/Hospitalization:  No - Comment as needed Significant Relationships:  Adult Children Lives with:  Adult Children Do you feel safe going back to the place where you live?  Yes Need for family participation in patient care:  Yes (Comment)  Care giving concerns: Patient from home with son. PT recommending SNF. Has used CPAP at hospital, does not have one at home.   Social Worker assessment / plan: CSW met with patient at bedside. Patient alert and oriented. CSW introduced self and role and discussed disposition planning - PT recommendation for SNF.  Patient reported he lives at home with his son in an apartment. He is usually independent with mobility and ADLs, but states he can't walk for long. He goes out for occasional errands but often stays at home. Patient understands the recommendation for SNF for rehab and is agreeable.  CSW faxed out initial referrals. Will provide CMS SNF list and bed offers when available. Patient will need [facility] authorization before admitting to a facility. Facility will initiate auth when identified. If patient still requiring CPAP, will need to have SNF order a CPAP for patient prior to discharge.  Employment status:  Retired Research officer, political party) PT Recommendations:  [place] / Referral to community resources:  [place]  Patient/Family's Response to care: Patient appreciative of care.  Patient/Family's Understanding of and Emotional Response to Diagnosis, Current Treatment, and Prognosis: Patient with understanding of his condition and agreeable to SNF.  Emotional Assessment Appearance:  Appears stated age Attitude/Demeanor/Rapport:  Engaged Affect (typically observed):  Accepting, Calm, Pleasant, Appropriate Orientation:  Oriented to Self, Oriented to Place, Oriented to  Time, Oriented to Situation Alcohol / Substance use:  Not Applicable Psych involvement (Current and /or in the community):  No (Comment)  Discharge Needs  Concerns to be addressed:  Discharge Planning Concerns, Care Coordination Readmission within the last 30 days:  No Current discharge risk:  Physical Impairment Barriers to Discharge:  Continued Medical Work up, [place], [place] 08/03/2018, 11:19 AM

## 2018-08-03 NOTE — Progress Notes (Signed)
PROGRESS NOTE   Kristopher [name]  GBE:[id number]    DOB: 04-Mar-1951    DOA: 07/21/2018  PCP: Maurice Small, MD   I have briefly reviewed patients previous [facility] in [facility].  Brief Narrative:  68 year old male, PMH of dilated cardiomyopathy, chronic combined systolic and diastolic CHF, pulmonary hypertension, CAD, HTN, LBBB, asthma/COPD, esophagitis/PUD/upper GI bleed, prediabetes, presented to ED on 07/21/2018 due to progressive weight gain and dyspnea.  He was initially admitted to the floor for decompensated CHF.  On 2/14, he decompensated with hypercapnia and acute encephalopathy, transferred to stepdown and started on BiPAP but had to be transferred to ICU for intubation and mechanical ventilation.  CCM took over care.  Advanced HF team consulted.  Extubated 2/19.  Improved and stabilized and transferred to progressive care unit under [facility] care on 2/22.  Cardiology considering right and left heart cath on 2/24.   Assessment & Plan:   Principal Problem:   Acute on chronic combined systolic and diastolic congestive heart failure, NYHA class 4 (HCC) Active Problems:   DOE (dyspnea on exertion)   Chronic combined systolic and diastolic heart failure (HCC)   HTN (hypertension)   DCM (dilated cardiomyopathy) (HCC)   PUD (peptic ulcer disease)   Pulmonary HTN ([place])   1. Acute on chronic combined systolic and diastolic CHF: Advanced HF team consulting and assisting with care.  TTE 07/22/2018: LVEF 25-30%.  Treated early on in admission with IV Lasix, Diamox and metolazone.  - 10.58 L since admission.  Currently on Lasix 40 mg daily and Aldactone 12.5 mg at bedtime.  Off Entresto due to recent acute kidney injury and low blood pressures.  As per cardiology, due to new low EF, plan left and right heart cath prior to DC to measure pulmonary pressures and reassess for CAD, possibly for 2/24 pending improvement in renal insufficiency.  CVP 6 today.  Improved and stable.  Clinically appears  euvolemic. 2. Acute on chronic hypoxic and hypercapnic respiratory failure: Likely multifactorial, COPD exacerbation, decompensated CHF, questionable pneumonia over probable OSA/OHS.  Extubated 2/19.  Aggressive pulmonary toilet.  Has been on nightly BiPAP for the last couple of days.  Minimize opioids.  Tracheal aspirate culture showed diphtheroids.  Has completed 7 days course of antibiotics (azithromycin/doxycycline)-discontinued.  Currently saturating in the mid 90s on oxygen via nasal cannula at 4 L/min. 3. Suspected OSA/OHS: Will need outpatient sleep study. 4. Acute on stage III chronic kidney disease: Last known baseline creatinine in May 2018 was in 1.1-1.2 range.  Presented with creatinine of 1.5.  This peaked to 2.70 this admission.  Creatinine has normalized.  Follow BMP closely. 5. Acute metabolic encephalopathy: Resolved. 6. Prediabetes: A1c 6.  CBGs well controlled in the hospital and not requiring much insulins.  DC CBG checks and SSI.  Outpatient follow-up. 7. Essential hypertension: Controlled.  Currently not on antihypertensives. 8. History of PUD/esophagitis/upper GI bleed: Continue PPI. 9. Deconditioning: PT and OT recommend SNF pending clinical improvement.  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 10. Pulmonary hypertension: Cardiology plans right and left heart cath on 2/24. 11. Constipation: Aggressive bowel regimen.  Last BM 2/22. 12. Leukocytosis: May be due to recent steroids.  No overt infectious etiology.  Continues to improve.  Follow CBCs. 13. Hypernatremia: Related to recent aggressive diuresis.  Resolved. 14. Hypokalemia: Replaced.  Magnesium normal.   DVT prophylaxis: Subcutaneous heparin Code Status: Full Family Communication: None at bedside Disposition: DC to SNF pending clinical improvement, possibly mid next week   Consultants:  PCCM Cardiology  Procedures:  ECHO 2/11 >> LVEF severely reduced 25-30%, RVSP ~28.6, LA moderately dilated, RA severely dilated, grade I  diastolic dysfunction  Central line: RN discussed with cardiology on 2/22 who wished to keep the central line at this time.  He is undergoing CVP checks.  Antimicrobials:  Doxycycline 2/15 >> 2/22 Azithromycin 2/15 >> 2/17   Subjective: Denies complaints.  No dyspnea reported.  No pain.  States that he has been on BiPAP overnight for the last couple of nights.  Aware that he will be going for cardiac cath in a.m.  ROS: As above, otherwise negative  Objective:  Vitals:   08/03/18 0449 08/03/18 0523 08/03/18 0741 08/03/18 0758  BP:    105/74  Pulse:    (!) 105  Resp:    (!) 32  Temp:    97.9 F (36.6 C)  TempSrc:    Oral  SpO2: 93% 97% 96% 94%  Weight:      Height:        Examination:  General exam: Pleasant middle-age male, moderately built and nourished lying comfortably propped up in bed. Respiratory system: Slightly harsh but no wheezing, rhonchi or crackles.  No increased work of breathing. Cardiovascular system: S1 & S2 heard, RRR. No JVD, murmurs, rubs, gallops or clicks.  Trace bilateral ankle edema.  Telemetry personally reviewed: ST in the 100s, BBB morphology and occasional PVCs.  Stable without change. Gastrointestinal system: Abdomen is nondistended, soft and nontender. No organomegaly or masses felt. Normal bowel sounds heard.  Stable. Central nervous system: Alert and oriented. No focal neurological deficits. Extremities: Symmetric 5 x 5 power. Skin: No rashes, lesions or ulcers Psychiatry: Judgement and insight appear normal. Mood & affect appropriate.     Data Reviewed: I have personally reviewed following labs and imaging studies  CBC: Recent Labs  Lab 07/30/18 0429 07/31/18 0411 08/01/18 0826 08/02/18 0620 08/03/18 0623  WBC 17.1* 28.3* 26.7* 23.5* 19.6*  NEUTROABS 14.5* 25.2* 23.9* 20.4* 16.6*  HGB 14.8 15.0 13.7 12.9* 12.9*  HCT 52.2* 52.6* 48.4 45.4 46.1  MCV 90.6 90.5 90.6 91.3 90.4  PLT 230 214 220 264 469   Basic Metabolic  Panel: Recent Labs  Lab 07/30/18 0429 07/31/18 0411 08/01/18 0826 08/02/18 0600 08/03/18 0623  NA 145 145 146* 147* 144  K 3.9 4.3 3.8 3.3* 3.9  CL 105 107 107 105 104  CO2 '31 27 31 ' 32 29  GLUCOSE 118* 134* 172* 108* 125*  BUN 77* 98* 80* 49* 32*  CREATININE 1.44* 2.28* 1.43* 1.04 1.01  CALCIUM 9.7 9.6 9.6 9.4 9.4  MG 2.2 2.4 2.4 2.3 2.1  PHOS 5.1* 5.9* 3.0 3.6 3.7   Liver Function Tests: Recent Labs  Lab 07/30/18 0429  AST 22  ALT 13  ALKPHOS 42  BILITOT 1.7*  PROT 6.2*  ALBUMIN 3.1*   CBG: Recent Labs  Lab 08/02/18 0744 08/02/18 1119 08/02/18 1616 08/02/18 2102 08/03/18 0748  GLUCAP 150* 98 108* 126* 135*    Recent Results (from the past 240 hour(s))  MRSA PCR Screening     Status: None   Collection Time: 07/25/18 10:25 AM  Result Value Ref Range Status   MRSA by PCR NEGATIVE NEGATIVE Final    Comment:        The GeneXpert MRSA Assay (FDA approved for NASAL specimens only), is one component of a comprehensive MRSA colonization surveillance program. It is not intended to diagnose MRSA infection nor to guide or monitor treatment for MRSA  infections. Performed at [facility], [place] [address] [place], [place] [address]   Culture, respiratory (non-expectorated)     Status: None   Collection Time: 07/26/18  1:26 PM  Result Value Ref Range Status   Specimen Description TRACHEAL ASPIRATE  Final   Special Requests Normal  Final   Gram Stain   Final    ABUNDANT WBC PRESENT, PREDOMINANTLY PMN RARE SQUAMOUS EPITHELIAL CELLS PRESENT MODERATE GRAM POSITIVE RODS RARE GRAM POSITIVE COCCI IN PAIRS RARE GRAM NEGATIVE RODS    Culture   Final    MODERATE DIPHTHEROIDS(CORYNEBACTERIUM SPECIES) Standardized susceptibility testing for this organism is not available. Performed at [facility], [place] [address], [place], [place] [address]    Report Status 07/29/2018 FINAL  Final  Respiratory Panel by PCR     Status: None   Collection Time: 07/26/18   5:32 PM  Result Value Ref Range Status   Adenovirus NOT DETECTED NOT DETECTED Final   Coronavirus 229E NOT DETECTED NOT DETECTED Final    Comment: (NOTE) The Coronavirus on the Respiratory Panel, DOES NOT test for the novel  Coronavirus (2019 nCoV)    Coronavirus HKU1 NOT DETECTED NOT DETECTED Final   Coronavirus NL63 NOT DETECTED NOT DETECTED Final   Coronavirus OC43 NOT DETECTED NOT DETECTED Final   Metapneumovirus NOT DETECTED NOT DETECTED Final   Rhinovirus / Enterovirus NOT DETECTED NOT DETECTED Final   Influenza A NOT DETECTED NOT DETECTED Final   Influenza B NOT DETECTED NOT DETECTED Final   Parainfluenza Virus 1 NOT DETECTED NOT DETECTED Final   Parainfluenza Virus 2 NOT DETECTED NOT DETECTED Final   Parainfluenza Virus 3 NOT DETECTED NOT DETECTED Final   Parainfluenza Virus 4 NOT DETECTED NOT DETECTED Final   Respiratory Syncytial Virus NOT DETECTED NOT DETECTED Final   Bordetella pertussis NOT DETECTED NOT DETECTED Final   Chlamydophila pneumoniae NOT DETECTED NOT DETECTED Final   Mycoplasma pneumoniae NOT DETECTED NOT DETECTED Final    Comment: Performed at [facility], [place]. [address], [place],  [address]         Radiology Studies: No results found.      Scheduled Meds: . aspirin  81 mg Oral Daily  . [START ON 08/04/2018] aspirin  81 mg Oral Pre-Cath  . chlorhexidine gluconate (MEDLINE KIT)  15 mL Mouth Rinse BID  . furosemide  40 mg Oral Daily  . heparin  5,000 Units Subcutaneous Q8H  . ipratropium  0.5 mg Nebulization TID  . levalbuterol  0.63 mg Nebulization TID  . mouth rinse  15 mL Mouth Rinse BID  . pantoprazole  40 mg Oral Daily  . polyethylene glycol  17 g Oral Daily  . ENSURE MAX PROTEIN  11 oz Oral BID  . sennosides  5 mL Oral QHS  . sodium chloride flush  10-40 mL Intracatheter Q12H  . sodium chloride flush  3 mL Intravenous Q12H  . sodium chloride flush  3 mL Intravenous Q12H  . spironolactone  12.5 mg Oral QHS   Continuous  Infusions: . sodium chloride    . sodium chloride    . [START ON 08/04/2018] sodium chloride       LOS: 13 days     Vernell Leep, MD, FACP, [facility]. [facility]  To contact the attending provider between 7A-7P or the covering provider during after hours 7P-7A, please log into the web site [email or web address] and access using universal [place] password for that web site. If you do not  have the password, please call the hospital operator.  08/03/2018, 1:19 PM

## 2018-08-04 ENCOUNTER — Encounter (HOSPITAL_COMMUNITY)
Admission: EM | Disposition: A | Discharge status: Skilled Nursing Facility | Payer: Self-pay | Source: Home / Self Care | Attending: Pulmonary Disease

## 2018-08-04 ENCOUNTER — Encounter (HOSPITAL_COMMUNITY): Payer: Self-pay | Admitting: Internal Medicine

## 2018-08-04 DIAGNOSIS — I2510 Atherosclerotic heart disease of native coronary artery without angina pectoris: Secondary | ICD-10-CM

## 2018-08-04 HISTORY — PX: RIGHT/LEFT HEART CATH AND CORONARY ANGIOGRAPHY: CATH118266

## 2018-08-04 LAB — POCT I-STAT EG7
Acid-Base Excess: 5 mmol/L — ABNORMAL HIGH (ref 0.0–2.0)
Acid-Base Excess: 10 mmol/L — ABNORMAL HIGH (ref 0.0–2.0)
Bicarbonate: 33.6 mmol/L — ABNORMAL HIGH (ref 20.0–28.0)
Bicarbonate: 38.7 mmol/L — ABNORMAL HIGH (ref 20.0–28.0)
Calcium, Ion: 1.11 mmol/L — ABNORMAL LOW (ref 1.15–1.40)
Calcium, Ion: 1.24 mmol/L (ref 1.15–1.40)
HCT: 41 % (ref 39.0–52.0)
HEMATOCRIT: 40 % (ref 39.0–52.0)
Hemoglobin: 13.6 g/dL (ref 13.0–17.0)
Hemoglobin: 13.9 g/dL (ref 13.0–17.0)
O2 Saturation: 65 %
O2 Saturation: 64 %
Potassium: 3.6 mmol/L (ref 3.5–5.1)
Potassium: 3.9 mmol/L (ref 3.5–5.1)
SODIUM: 146 mmol/L — AB (ref 135–145)
Sodium: 144 mmol/L (ref 135–145)
TCO2: 36 mmol/L — ABNORMAL HIGH (ref 22–32)
TCO2: 41 mmol/L — ABNORMAL HIGH (ref 22–32)
pCO2, Ven: 66.6 mmHg — ABNORMAL HIGH (ref 44.0–60.0)
pCO2, Ven: 73.3 mmHg (ref 44.0–60.0)
pH, Ven: 7.311 (ref 7.250–7.430)
pH, Ven: 7.331 (ref 7.250–7.430)
pO2, Ven: 38 mmHg (ref 32.0–45.0)
pO2, Ven: 37 mmHg (ref 32.0–45.0)

## 2018-08-04 LAB — POCT I-STAT 7, (LYTES, BLD GAS, ICA,H+H)
Acid-Base Excess: 7 mmol/L — ABNORMAL HIGH (ref 0.0–2.0)
BICARBONATE: 35.4 mmol/L — AB (ref 20.0–28.0)
Calcium, Ion: 1.19 mmol/L (ref 1.15–1.40)
HCT: 40 % (ref 39.0–52.0)
Hemoglobin: 13.6 g/dL (ref 13.0–17.0)
O2 Saturation: 94 %
Potassium: 3.7 mmol/L (ref 3.5–5.1)
SODIUM: 144 mmol/L (ref 135–145)
TCO2: 37 mmol/L — ABNORMAL HIGH (ref 22–32)
pCO2 arterial: 67.2 mmHg (ref 32.0–48.0)
pH, Arterial: 7.33 — ABNORMAL LOW (ref 7.350–7.450)
pO2, Arterial: 80 mmHg — ABNORMAL LOW (ref 83.0–108.0)

## 2018-08-04 LAB — CBC WITH DIFFERENTIAL/PLATELET
ABS IMMATURE GRANULOCYTES: 0.15 10*3/uL — AB (ref 0.00–0.07)
Basophils Absolute: 0 10*3/uL (ref 0.0–0.1)
Basophils Relative: 0 %
Eosinophils Absolute: 0.2 10*3/uL (ref 0.0–0.5)
Eosinophils Relative: 1 %
HCT: 45.1 % (ref 39.0–52.0)
Hemoglobin: 12.9 g/dL — ABNORMAL LOW (ref 13.0–17.0)
Immature Granulocytes: 1 %
Lymphocytes Relative: 10 %
Lymphs Abs: 1.7 10*3/uL (ref 0.7–4.0)
MCH: 26.1 pg (ref 26.0–34.0)
MCHC: 28.6 g/dL — ABNORMAL LOW (ref 30.0–36.0)
MCV: 91.3 fL (ref 80.0–100.0)
Monocytes Absolute: 1.3 10*3/uL — ABNORMAL HIGH (ref 0.1–1.0)
Monocytes Relative: 7 %
NEUTROS PCT: 81 %
Neutro Abs: 14.1 10*3/uL — ABNORMAL HIGH (ref 1.7–7.7)
Platelets: 267 10*3/uL (ref 150–400)
RBC: 4.94 MIL/uL (ref 4.22–5.81)
RDW: 18.7 % — ABNORMAL HIGH (ref 11.5–15.5)
WBC: 17.4 10*3/uL — ABNORMAL HIGH (ref 4.0–10.5)
nRBC: 0 % (ref 0.0–0.2)

## 2018-08-04 LAB — BASIC METABOLIC PANEL
Anion gap: 10 (ref 5–15)
BUN: 17 mg/dL (ref 8–23)
CHLORIDE: 100 mmol/L (ref 98–111)
CO2: 32 mmol/L (ref 22–32)
Calcium: 9.2 mg/dL (ref 8.9–10.3)
Creatinine, Ser: 0.91 mg/dL (ref 0.61–1.24)
GFR calc Af Amer: >60 mL/min (ref >60)
GFR calc non Af Amer: >60 mL/min (ref >60)
Glucose, Bld: 110 mg/dL — ABNORMAL HIGH (ref 70–99)
Potassium: 3.8 mmol/L (ref 3.5–5.1)
Sodium: 142 mmol/L (ref 135–145)

## 2018-08-04 LAB — PHOSPHORUS: Phosphorus: 3.7 mg/dL (ref 2.5–4.6)

## 2018-08-04 LAB — MAGNESIUM: Magnesium: 2.1 mg/dL (ref 1.7–2.4)

## 2018-08-04 SURGERY — RIGHT/LEFT HEART CATH AND CORONARY ANGIOGRAPHY
Anesthesia: LOCAL

## 2018-08-04 MED ORDER — SODIUM CHLORIDE 0.9% FLUSH
3.0000 mL | Freq: Two times a day (BID) | INTRAVENOUS | Status: DC
  Administered 2018-08-04 – 2018-08-07 (×5): 3 mL via INTRAVENOUS

## 2018-08-04 MED ORDER — SODIUM CHLORIDE 0.9% FLUSH
3.0000 mL | INTRAVENOUS | Status: DC | PRN
  Administered 2018-08-04: 3 mL via INTRAVENOUS
  Filled 2018-08-04: qty 3

## 2018-08-04 MED ORDER — HEPARIN SODIUM (PORCINE) 1000 UNIT/ML IJ SOLN
INTRAMUSCULAR | Status: AC
  Filled 2018-08-04: qty 1

## 2018-08-04 MED ORDER — VERAPAMIL HCL 2.5 MG/ML IV SOLN
INTRAVENOUS | Status: AC
  Filled 2018-08-04: qty 2

## 2018-08-04 MED ORDER — ONDANSETRON HCL 4 MG/2ML IJ SOLN: 4.0000 mg | Freq: Four times a day (QID) | INTRAMUSCULAR | Status: DC | PRN

## 2018-08-04 MED ORDER — SACUBITRIL-VALSARTAN 24-26 MG PO TABS
1.0000 | ORAL_TABLET | Freq: Two times a day (BID) | ORAL | Status: DC
  Administered 2018-08-04 – 2018-08-08 (×8): 1 via ORAL
  Filled 2018-08-04 (×8): qty 1

## 2018-08-04 MED ORDER — MIDAZOLAM HCL 2 MG/2ML IJ SOLN
INTRAMUSCULAR | Status: AC
  Filled 2018-08-04: qty 2

## 2018-08-04 MED ORDER — ACETAMINOPHEN 325 MG PO TABS: 650.0000 mg | ORAL_TABLET | ORAL | Status: DC | PRN

## 2018-08-04 MED ORDER — LIDOCAINE HCL (PF) 1 % IJ SOLN
INTRAMUSCULAR | Status: DC | PRN
  Administered 2018-08-04: 5 mL

## 2018-08-04 MED ORDER — FENTANYL CITRATE (PF) 100 MCG/2ML IJ SOLN
INTRAMUSCULAR | Status: AC
  Filled 2018-08-04: qty 2

## 2018-08-04 MED ORDER — HEPARIN (PORCINE) IN NACL 1000-0.9 UT/500ML-% IV SOLN
INTRAVENOUS | Status: DC | PRN
  Administered 2018-08-04 (×2): 500 mL

## 2018-08-04 MED ORDER — VERAPAMIL HCL 2.5 MG/ML IV SOLN
INTRAVENOUS | Status: DC | PRN
  Administered 2018-08-04: 10 mL via INTRA_ARTERIAL

## 2018-08-04 MED ORDER — SODIUM CHLORIDE 0.9 % IV SOLN: 250.0000 mL | INTRAVENOUS | Status: DC | PRN

## 2018-08-04 MED ORDER — LIDOCAINE HCL (PF) 1 % IJ SOLN
INTRAMUSCULAR | Status: AC
  Filled 2018-08-04: qty 30

## 2018-08-04 MED ORDER — HEPARIN SODIUM (PORCINE) 1000 UNIT/ML IJ SOLN
INTRAMUSCULAR | Status: DC | PRN
  Administered 2018-08-04: 5000 [IU] via INTRAVENOUS

## 2018-08-04 MED ORDER — HEPARIN SODIUM (PORCINE) 5000 UNIT/ML IJ SOLN
5000.0000 [IU] | Freq: Three times a day (TID) | INTRAMUSCULAR | Status: DC
  Administered 2018-08-05 – 2018-08-08 (×8): 5000 [IU] via SUBCUTANEOUS
  Filled 2018-08-04 (×8): qty 1

## 2018-08-04 MED ORDER — IOHEXOL 350 MG/ML SOLN
INTRAVENOUS | Status: DC | PRN
  Administered 2018-08-04: 55 mL via INTRACARDIAC

## 2018-08-04 MED ORDER — SODIUM CHLORIDE 0.9 % IV SOLN: INTRAVENOUS | Status: AC

## 2018-08-04 MED ORDER — HEPARIN (PORCINE) IN NACL 1000-0.9 UT/500ML-% IV SOLN
INTRAVENOUS | Status: AC
  Filled 2018-08-04: qty 1000

## 2018-08-04 SURGICAL SUPPLY — 13 items
CATH 5FR JL3.5 JR4 ANG PIG MP (CATHETERS) ×1 IMPLANT
CATH BALLN WEDGE 5F 110CM (CATHETERS) ×1 IMPLANT
DEVICE RAD COMP TR BAND LRG (VASCULAR PRODUCTS) ×1 IMPLANT
GLIDESHEATH SLEND SS 6F .021 (SHEATH) ×1 IMPLANT
GUIDEWIRE INQWIRE 1.5J.035X260 (WIRE) IMPLANT
INQWIRE 1.5J .035X260CM (WIRE) ×2
KIT HEART LEFT (KITS) ×2 IMPLANT
KIT MICROPUNCTURE NIT STIFF (SHEATH) ×1 IMPLANT
PACK CARDIAC CATHETERIZATION (CUSTOM PROCEDURE TRAY) ×2 IMPLANT
SHEATH GLIDE SLENDER 4/5FR (SHEATH) ×1 IMPLANT
SHEATH PROBE COVER 6X72 (BAG) ×1 IMPLANT
TRANSDUCER W/STOPCOCK (MISCELLANEOUS) ×2 IMPLANT
TUBING CIL FLEX 10 FLL-RA (TUBING) ×2 IMPLANT

## 2018-08-04 NOTE — Progress Notes (Addendum)
Patient ID: Kristopher [name], male   DOB: 01/24/1951, 68 y.o.   MRN: [id number]     Advanced Heart Failure Rounding Note  PCP-Cardiologist: Traci Turner, MD   Subjective:    Extubated 07/30/18. Started on PO lasix for the weekend. Creatinine now normal 0.91. Weights inaccurate. CVP 4-5. SBP 120-130s  WBC improving 17.4, now off steroids. Afebrile.   Denies CP, SOB, orthopnea. No cough. No dizziness. He is hungry.   Going for R/LHC today. On schedule for 10 am.   Echo 07/22/18: EF 25-30%, global HK, RV normal function, increased wall thickness, LA moderately dilated, RA severely dilated  Objective:   Weight Range: 105.8 kg Body mass index is 37.66 kg/m.   Vital Signs:   Temp:  [97.8 F (36.6 C)-98.7 F (37.1 C)] 97.8 F (36.6 C) (02/24 0509) Pulse Rate:  [93-109] 100 (02/24 0809) Resp:  [19-29] 21 (02/24 0809) BP: (123-130)/(64-90) 128/90 (02/24 0509) SpO2:  [93 %-99 %] 93 % (02/24 0809) Weight:  [105.8 kg] 105.8 kg (02/24 0510) Last BM Date: 08/02/18  Weight change: Filed Weights   08/01/18 0500 08/02/18 0359 08/04/18 0510  Weight: 106.9 kg 94.5 kg 105.8 kg   Intake/Output:   Intake/Output Summary (Last 24 hours) at 08/04/2018 0943 Last data filed at 08/04/2018 0700 Gross per 24 hour  Intake 611.17 ml  Output 1300 ml  Net -688.83 ml    Physical Exam    General: No resp difficulty. HEENT: Normal Neck: Supple. JVP difficult, but does not appear elevated. Carotids 2+ bilat; no bruits. No thyromegaly or nodule noted. Cor: PMI nondisplaced. Regular, tachy. No M/G/R noted Lungs: CTAB, normal effort. On 4 L [place]. No wheeze  Abdomen: Obese Soft, non-tender, non-distended, no HSM. No bruits or masses. +BS  Extremities: No cyanosis, clubbing, or rash. R and LLE no edema.  Neuro: Alert & orientedx3, cranial nerves grossly intact. moves all 4 extremities w/o difficulty. Affect pleasant  Telemetry   NSR/sinus tach 90-100s. Personally reviewed.   EKG    No new tracings.      Labs    CBC Recent Labs    08/03/18 0623 08/04/18 0555  WBC 19.6* 17.4*  NEUTROABS 16.6* 14.1*  HGB 12.9* 12.9*  HCT 46.1 45.1  MCV 90.4 91.3  PLT 263 267   Basic Metabolic Panel Recent Labs    08/03/18 0623 08/04/18 0555  NA 144 142  K 3.9 3.8  CL 104 100  CO2 29 32  GLUCOSE 125* 110*  BUN 32* 17  CREATININE 1.01 0.91  CALCIUM 9.4 9.2  MG 2.1 2.1  PHOS 3.7 3.7   Liver Function Tests No results for input(s): AST, ALT, ALKPHOS, BILITOT, PROT, ALBUMIN in the last 72 hours. No results for input(s): LIPASE, AMYLASE in the last 72 hours. Cardiac Enzymes No results for input(s): CKTOTAL, CKMB, CKMBINDEX, TROPONINI in the last 72 hours.  BNP: BNP (last 3 results) Recent Labs    07/21/18 1123  BNP 964.4*   ProBNP (last 3 results) No results for input(s): PROBNP in the last 8760 hours.  D-Dimer No results for input(s): DDIMER in the last 72 hours. Hemoglobin A1C No results for input(s): HGBA1C in the last 72 hours. Fasting Lipid Panel No results for input(s): CHOL, HDL, LDLCALC, TRIG, CHOLHDL, LDLDIRECT in the last 72 hours. Thyroid Function Tests No results for input(s): TSH, T4TOTAL, T3FREE, THYROIDAB in the last 72 hours.  Invalid input(s): FREET3  Other results:  Imaging    No results found.  Medications:       Scheduled Medications: . aspirin  81 mg Oral Daily  . chlorhexidine gluconate (MEDLINE KIT)  15 mL Mouth Rinse BID  . furosemide  40 mg Oral Daily  . heparin  5,000 Units Subcutaneous Q8H  . ipratropium  0.5 mg Nebulization TID  . levalbuterol  0.63 mg Nebulization TID  . mouth rinse  15 mL Mouth Rinse BID  . pantoprazole  40 mg Oral Daily  . polyethylene glycol  17 g Oral Daily  . ENSURE MAX PROTEIN  11 oz Oral BID  . sennosides  5 mL Oral QHS  . sodium chloride flush  10-40 mL Intracatheter Q12H  . sodium chloride flush  3 mL Intravenous Q12H  . sodium chloride flush  3 mL Intravenous Q12H  . spironolactone  12.5 mg Oral QHS     Infusions: . sodium chloride    . sodium chloride    . 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   Assessment/Plan  1. Acute on chronic systolic/diastolic HF - Echo 01/07/17 EF 45%. Cath with minimal CAD - Echo 07/22/18: EF 25-30%, global HK, RV normal function, increased wall thickness, LA moderately dilated, RA severely dilated - Volume low. CVP 4-5.  - Hold lasix with low CVP so that we can add back Entresto hopefully tonight.  - SBP 120-130s now and creatinine now normal.  - Restart entresto 24/26 mg BID for tonight as long as filling pressures are not too low on cath today.  - Continue spiro 12.5 mg daily - May be able to add back BB today. Will wait until after cath. Consider bisoprolol with COPD.  - This admit mostly secondary to COPD flare but likely component of HF. With lower EF Plan left and RHC prior to d/c to measure pulmonary pressures and reassess for CAD. Going for R/LHC today.  2. Acute hypoxic respiratory failure - Likely combination of HF and AECOPD. - Extubated 2/19. Now on 4 L [place]. He was not on O2 PTA.  3. Undiagnosed OSA - Will need outpatient sleep study. Will likely need to set him up with BiPAP prior to DC. No change.   4. DM2 - Per primary team - Consider SGLT2i at d/c. No change.   5. AKI on CKD III - Resolved. Cr peaked at 2.28. Creatinine 0.91 this am.   Kristopher M Smith, NP  9:43 AM  Advanced Heart Failure Team Pager [phone] (M-F; 7a - 4p)  Please contact [facility] for night-coverage after hours (4p -7a ) and weekends on [email or web address]   Patient seen and examined  with the above-signed Advanced Practice Provider and/or Housestaff. I personally reviewed laboratory data, imaging studies and relevant notes. I independently examined the patient and formulated the important aspects of the plan. I have edited the note to reflect any of my changes or salient points. I have personally discussed the plan with the patient and/or family.  Stable this am. Volume status improved. Will plan R/L cath today to assess for PAH and CAD given new fall in EF.   I have reviewed the risks, indications, and alternatives to angioplasty and stenting with the patient. Risks include but are not limited to bleeding, infection, vascular injury, stroke,   myocardial infection, arrhythmia, kidney injury, radiation-related injury in the case of prolonged fluoroscopy use, emergency cardiac surgery, and death. The patient understands the risks of serious complication is low (<9%) and he agrees to proceed.   Glori Bickers, MD  10:47 AM

## 2018-08-04 NOTE — H&P (View-Only) (Signed)
Patient ID: Kristopher [name], male   DOB: 01/04/1951, 68 y.o.   MRN: [id number]     Advanced Heart Failure Rounding Note  PCP-Cardiologist: Kristopher Him, MD   Subjective:    Extubated 07/30/18. Started on PO lasix for the weekend. Creatinine now normal 0.91. Weights inaccurate. CVP 4-5. SBP 120-130s  WBC improving 17.4, now off steroids. Afebrile.   Denies CP, SOB, orthopnea. No cough. No dizziness. He is hungry.   Going for [facility] today. On schedule for 10 am.   Echo 07/22/18: EF 25-30%, global HK, RV normal function, increased wall thickness, LA moderately dilated, RA severely dilated  Objective:   Weight Range: 105.8 kg Body mass index is 37.66 kg/m.   Vital Signs:   Temp:  [97.8 F (36.6 C)-98.7 F (37.1 C)] 97.8 F (36.6 C) (02/24 0509) Pulse Rate:  [93-109] 100 (02/24 0809) Resp:  [19-29] 21 (02/24 0809) BP: (123-130)/(64-90) 128/90 (02/24 0509) SpO2:  [93 %-99 %] 93 % (02/24 0809) Weight:  [105.8 kg] 105.8 kg (02/24 0510) Last BM Date: 08/02/18  Weight change: Filed Weights   08/01/18 0500 08/02/18 0359 08/04/18 0510  Weight: 106.9 kg 94.5 kg 105.8 kg   Intake/Output:   Intake/Output Summary (Last 24 hours) at 08/04/2018 0943 Last data filed at 08/04/2018 0700 Gross per 24 hour  Intake 611.17 ml  Output 1300 ml  Net -688.83 ml    Physical Exam    General: No resp difficulty. HEENT: Normal Neck: Supple. JVP difficult, but does not appear elevated. Carotids 2+ bilat; no bruits. No thyromegaly or nodule noted. Cor: PMI nondisplaced. Regular, tachy. No M/G/R noted Lungs: CTAB, normal effort. On 4 L [place]. No wheeze  Abdomen: Obese Soft, non-tender, non-distended, no HSM. No bruits or masses. +BS  Extremities: No cyanosis, clubbing, or rash. R and LLE no edema.  Neuro: Alert & orientedx3, cranial nerves grossly intact. moves all 4 extremities w/o difficulty. Affect pleasant  Telemetry   NSR/sinus tach 90-100s. Personally reviewed.   EKG    No new tracings.      Labs    CBC Recent Labs    08/03/18 0623 08/04/18 0555  WBC 19.6* 17.4*  NEUTROABS 16.6* 14.1*  HGB 12.9* 12.9*  HCT 46.1 45.1  MCV 90.4 91.3  PLT 263 563   Basic Metabolic Panel Recent Labs    08/03/18 0623 08/04/18 0555  NA 144 142  K 3.9 3.8  CL 104 100  CO2 29 32  GLUCOSE 125* 110*  BUN 32* 17  CREATININE 1.01 0.91  CALCIUM 9.4 9.2  MG 2.1 2.1  PHOS 3.7 3.7   Liver Function Tests No results for input(s): AST, ALT, ALKPHOS, BILITOT, PROT, ALBUMIN in the last 72 hours. No results for input(s): LIPASE, AMYLASE in the last 72 hours. Cardiac Enzymes No results for input(s): CKTOTAL, CKMB, CKMBINDEX, TROPONINI in the last 72 hours.  BNP: BNP (last 3 results) Recent Labs    07/21/18 1123  BNP 964.4*   ProBNP (last 3 results) No results for input(s): PROBNP in the last 8760 hours.  D-Dimer No results for input(s): DDIMER in the last 72 hours. Hemoglobin A1C No results for input(s): HGBA1C in the last 72 hours. Fasting Lipid Panel No results for input(s): CHOL, HDL, LDLCALC, TRIG, CHOLHDL, LDLDIRECT in the last 72 hours. Thyroid Function Tests No results for input(s): TSH, T4TOTAL, T3FREE, THYROIDAB in the last 72 hours.  Invalid input(s): FREET3  Other results:  Imaging    No results found.  Medications:  Scheduled Medications: . aspirin  81 mg Oral Daily  . chlorhexidine gluconate (MEDLINE KIT)  15 mL Mouth Rinse BID  . furosemide  40 mg Oral Daily  . heparin  5,000 Units Subcutaneous Q8H  . ipratropium  0.5 mg Nebulization TID  . levalbuterol  0.63 mg Nebulization TID  . mouth rinse  15 mL Mouth Rinse BID  . pantoprazole  40 mg Oral Daily  . polyethylene glycol  17 g Oral Daily  . ENSURE MAX PROTEIN  11 oz Oral BID  . sennosides  5 mL Oral QHS  . sodium chloride flush  10-40 mL Intracatheter Q12H  . sodium chloride flush  3 mL Intravenous Q12H  . sodium chloride flush  3 mL Intravenous Q12H  . spironolactone  12.5 mg Oral QHS     Infusions: . sodium chloride    . sodium chloride    . 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   Assessment/Plan  1. Acute on chronic systolic/diastolic HF - Echo 2/67/12 EF 45%. Cath with minimal CAD - Echo 07/22/18: EF 25-30%, global HK, RV normal function, increased wall thickness, LA moderately dilated, RA severely dilated - Volume low. CVP 4-5.  - Hold lasix with low CVP so that we can add back Entresto hopefully tonight.  - SBP 120-130s now and creatinine now normal.  - Restart entresto 24/26 mg BID for tonight as long as filling pressures are not too low on cath today.  - Continue spiro 12.5 mg daily - May be able to add back BB today. Will wait until after cath. Consider bisoprolol with COPD.  - This admit mostly secondary to COPD flare but likely component of HF. With lower EF Plan left and RHC prior to d/c to measure pulmonary pressures and reassess for CAD. Going for [facility] today.  2. Acute hypoxic respiratory failure - Likely combination of HF and AECOPD. - Extubated 2/19. Now on 4 L . He was not on O2 PTA.  3. Undiagnosed OSA - Will need outpatient sleep study. Will likely need to set [name] up with BiPAP prior to DC. No change.   4. DM2 - Per primary team - Consider SGLT2i at d/c. No change.   5. AKI on CKD III - Resolved. Cr peaked at 2.28. Creatinine 0.91 this am.   Georgiana Shore, NP  9:43 AM  Advanced Heart Failure Team Pager [phone] (M-F; 7a - 4p)  Please contact [facility] for night-coverage after hours (4p -7a ) and weekends on [email or web address]   Patient seen and examined  with the above-signed Advanced Practice Provider and/or Housestaff. I personally reviewed laboratory data, imaging studies and relevant notes. I independently examined the patient and formulated the important aspects of the plan. I have edited the note to reflect any of my changes or salient points. I have personally discussed the plan with the patient and/or family.  Stable this am. Volume status improved. Will plan R/L cath today to assess for PAH and CAD given new fall in EF.   I have reviewed the risks, indications, and alternatives to angioplasty and stenting with the patient. Risks include but are not limited to bleeding, infection, vascular injury, stroke,  myocardial infection, arrhythmia, kidney injury, radiation-related injury in the case of prolonged fluoroscopy use, emergency cardiac surgery, and death. The patient understands the risks of serious complication is low (<9%) and he agrees to proceed.   Glori Bickers, MD  10:47 AM

## 2018-08-04 NOTE — Progress Notes (Signed)
Pt refusing BIPAP QHS at this time. Advised pt to notify for RT if he decides he wants to wear overnight. RT will continue to monitor.

## 2018-08-04 NOTE — Progress Notes (Signed)
PT Cancellation Note  Patient Details Name: JENNER SKAU MRN: [id number] DOB: Jul 19, 1950   Cancelled Treatment:    Reason Eval/Treat Not Completed: Medical issues which prohibited therapy(Pt in CATH. Will reattempt as able. )   Amadeo Garnet Rakeya Glab 08/04/2018, 10:39 AM  [facility] Pager:  [phone]  Office:  [phone]

## 2018-08-04 NOTE — Progress Notes (Signed)
Respiratory care assessment protocol done.  No changes to current plan on of care per severity score.

## 2018-08-04 NOTE — Interval H&P Note (Signed)
History and Physical Interval Note:  08/04/2018 10:48 AM  Kristopher [name]  has presented today for surgery, with the diagnosis of HF  The various methods of treatment have been discussed with the patient and family. After consideration of risks, benefits and other options for treatment, the patient has consented to  Procedure(s): RIGHT/LEFT HEART CATH AND CORONARY ANGIOGRAPHY (N/A) and possible coronary angioplasty as a surgical intervention .  The patient's history has been reviewed, patient examined, no change in status, stable for surgery.  I have reviewed the patient's chart and labs.  Questions were answered to the patient's satisfaction.     Kristopher [name]

## 2018-08-04 NOTE — Progress Notes (Signed)
PROGRESS NOTE   Kristopher [name]  YQI:[id number]    DOB: 1951/03/01    DOA: 07/21/2018  PCP: Maurice Small, MD   I have briefly reviewed patients previous medical records in [facility].  Brief Narrative:  68 year old male, PMH of dilated cardiomyopathy, chronic combined systolic and diastolic CHF, pulmonary hypertension, CAD, HTN, LBBB, asthma/COPD, esophagitis/PUD/upper GI bleed, prediabetes, presented to ED on 07/21/2018 due to progressive weight gain and dyspnea.  He was initially admitted to the floor for decompensated CHF.  On 2/14, he decompensated with hypercapnia and acute encephalopathy, transferred to stepdown and started on BiPAP but had to be transferred to ICU for intubation and mechanical ventilation.  CCM took over care.  Advanced HF team consulted.  Extubated 2/19.  Improved and stabilized and transferred to progressive care unit under [facility] care on 2/22.  Cardiology considering right and left heart cath on 2/24.   Assessment & Plan:   Principal Problem:   Acute on chronic combined systolic and diastolic congestive heart failure, NYHA class 4 (HCC) Active Problems:   DOE (dyspnea on exertion)   Chronic combined systolic and diastolic heart failure (HCC)   HTN (hypertension)   DCM (dilated cardiomyopathy) (HCC)   PUD (peptic ulcer disease)   Pulmonary HTN ([place])   1. Acute on chronic combined systolic and diastolic CHF: Advanced HF team consulting and assisting with care.  TTE 07/22/2018: LVEF 25-30%.  Treated early on in admission with IV Lasix, Diamox and metolazone.  - 10.58 L since admission.  Currently on Lasix 40 mg daily and Aldactone 12.5 mg at bedtime.  Off Entresto due to recent acute kidney injury and low blood pressures.  As per cardiology, due to new low EF, underwent right and left heart cath on 2/24, report as below, minimal nonobstructive CAD, NICM EF 30-35%, moderate pulmonary hypertension due primarily due to OSA/OHS and well compensated volume status.  Lasix held  this morning with plans to start [facility].  Cardiology considering adding back beta blockers.  As per cardiology, possible discharge to SNF in a.m. 2. Acute on chronic hypoxic and hypercapnic respiratory failure: Likely multifactorial, COPD exacerbation, decompensated CHF, questionable pneumonia over probable OSA/OHS.  Extubated 2/19.  Aggressive pulmonary toilet.  Has been on nightly BiPAP for the last couple of days.  Minimize opioids.  Tracheal aspirate culture showed diphtheroids.  Has completed 7 days course of antibiotics (azithromycin/doxycycline)-discontinued.  Currently saturating in the mid 90s on oxygen via nasal cannula at 4 L/min.?  Home on CPAP/BiPAP. 3. Suspected OSA/OHS: Will need outpatient sleep study. 4. Acute on stage III chronic kidney disease: Last known baseline creatinine in May 2018 was in 1.1-1.2 range.  Presented with creatinine of 1.5.  This peaked to 2.70 this admission.  Creatinine has normalized.  Follow BMP closely. 5. Acute metabolic encephalopathy: Resolved. 6. Prediabetes: A1c 6.  CBGs well controlled in the hospital and not requiring much insulins.  DC CBG checks and SSI.  Outpatient follow-up. 7. Essential hypertension: Controlled.  Currently not on antihypertensives. 8. History of PUD/esophagitis/upper GI bleed: Continue PPI. 9. Deconditioning: PT and OT recommend SNF pending clinical improvement.  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 10. Pulmonary hypertension: Moderate pulmonary hypertension by heart cath 2/24. 11. Constipation: Aggressive bowel regimen.  Last BM 2/22. 12. Leukocytosis: May be due to recent steroids.  No overt infectious etiology.  Continues to improve.  Follow CBCs. 13. Hypernatremia: Related to recent aggressive diuresis.  Resolved. 14. Hypokalemia: Replaced.  Magnesium normal.   DVT prophylaxis: Subcutaneous heparin  Code Status: Full Family Communication: None at bedside Disposition: DC to SNF pending clinical improvement, possibly  2/25   Consultants:  PCCM Cardiology  Procedures:  ECHO 2/11 >> LVEF severely reduced 25-30%, RVSP ~28.6, LA moderately dilated, RA severely dilated, grade I diastolic dysfunction  Central line: RN discussed with cardiology on 2/22 who wished to keep the central line at this time.  He is undergoing CVP checks.  Right and left heart cath 2/24:  Assessment: 1. Minimal non-obstructive CAD 2. NICM EF 30-35% 3. Moderate pulmonary HTN due primarily to OSA/OHS 4. Relative well-compensated volume status  Plan/Discussion:  Continue medical therapy. Will need aggressive management of OHS/OSA. Can go home tomorrow from our standpoint if otherwise stable.  Antimicrobials:  Doxycycline 2/15 >> 2/22 Azithromycin 2/15 >> 2/17   Subjective: Seen this morning prior to procedure.  Reports "dry mouth and breathing".  Denied dyspnea or chest pain.  ROS: As above, otherwise negative  Objective:  Vitals:   08/04/18 1139 08/04/18 1203 08/04/18 1320 08/04/18 1406  BP: 135/85 (!) 159/86 (!) 148/88   Pulse: (!) 120 (!) 101  (!) 102  Resp: (!) 43 20 (!) 38 (!) 22  Temp:      TempSrc:      SpO2: 93% 95% 97%   Weight:      Height:        Examination:  General exam: Pleasant middle-age male, moderately built and nourished lying comfortably propped up in bed. Respiratory system: Clear to auscultation.  No increased work of breathing.  Cardiovascular system: S1 & S2 heard, RRR. No JVD, murmurs, rubs, gallops or clicks.  Trace bilateral ankle edema.  Telemetry personally reviewed: SR-ST in the 100s with BBB morphology. Gastrointestinal system: Abdomen is nondistended, soft and nontender. No organomegaly or masses felt. Normal bowel sounds heard.  Stable Central nervous system: Alert and oriented. No focal neurological deficits.  Stable Extremities: Symmetric 5 x 5 power. Skin: No rashes, lesions or ulcers Psychiatry: Judgement and insight appear normal. Mood & affect appropriate.     Data  Reviewed: I have personally reviewed following labs and imaging studies  CBC: Recent Labs  Lab 07/31/18 0411 08/01/18 0826 08/02/18 0620 08/03/18 0623 08/04/18 0555 08/04/18 1126  WBC 28.3* 26.7* 23.5* 19.6* 17.4*  --   NEUTROABS 25.2* 23.9* 20.4* 16.6* 14.1*  --   HGB 15.0 13.7 12.9* 12.9* 12.9* 13.6  HCT 52.6* 48.4 45.4 46.1 45.1 40.0  MCV 90.5 90.6 91.3 90.4 91.3  --   PLT 214 220 264 263 267  --    Basic Metabolic Panel: Recent Labs  Lab 07/31/18 0411 08/01/18 0826 08/02/18 0600 08/03/18 0623 08/04/18 0555 08/04/18 1126  NA 145 146* 147* 144 142 146*  K 4.3 3.8 3.3* 3.9 3.8 3.6  CL 107 107 105 104 100  --   CO2 27 31 32 29 32  --   GLUCOSE 134* 172* 108* 125* 110*  --   BUN 98* 80* 49* 32* 17  --   CREATININE 2.28* 1.43* 1.04 1.01 0.91  --   CALCIUM 9.6 9.6 9.4 9.4 9.2  --   MG 2.4 2.4 2.3 2.1 2.1  --   PHOS 5.9* 3.0 3.6 3.7 3.7  --    Liver Function Tests: Recent Labs  Lab 07/30/18 0429  AST 22  ALT 13  ALKPHOS 42  BILITOT 1.7*  PROT 6.2*  ALBUMIN 3.1*   CBG: Recent Labs  Lab 08/02/18 1119 08/02/18 1616 08/02/18 2102 08/03/18 0748 08/03/18 2236  GLUCAP 98 108* 126* 135* 110*    Recent Results (from the past 240 hour(s))  Culture, respiratory (non-expectorated)     Status: None   Collection Time: 07/26/18  1:26 PM  Result Value Ref Range Status   Specimen Description TRACHEAL ASPIRATE  Final   Special Requests Normal  Final   Gram Stain   Final    ABUNDANT WBC PRESENT, PREDOMINANTLY PMN RARE SQUAMOUS EPITHELIAL CELLS PRESENT MODERATE GRAM POSITIVE RODS RARE GRAM POSITIVE COCCI IN PAIRS RARE GRAM NEGATIVE RODS    Culture   Final    MODERATE DIPHTHEROIDS(CORYNEBACTERIUM SPECIES) Standardized susceptibility testing for this organism is not available. Performed at [facility], [place] [address], [place], [place] [address]    Report Status 07/29/2018 FINAL  Final  Respiratory Panel by PCR     Status: None   Collection Time: 07/26/18   5:32 PM  Result Value Ref Range Status   Adenovirus NOT DETECTED NOT DETECTED Final   Coronavirus 229E NOT DETECTED NOT DETECTED Final    Comment: (NOTE) The Coronavirus on the Respiratory Panel, DOES NOT test for the novel  Coronavirus (2019 nCoV)    Coronavirus HKU1 NOT DETECTED NOT DETECTED Final   Coronavirus NL63 NOT DETECTED NOT DETECTED Final   Coronavirus OC43 NOT DETECTED NOT DETECTED Final   Metapneumovirus NOT DETECTED NOT DETECTED Final   Rhinovirus / Enterovirus NOT DETECTED NOT DETECTED Final   Influenza A NOT DETECTED NOT DETECTED Final   Influenza B NOT DETECTED NOT DETECTED Final   Parainfluenza Virus 1 NOT DETECTED NOT DETECTED Final   Parainfluenza Virus 2 NOT DETECTED NOT DETECTED Final   Parainfluenza Virus 3 NOT DETECTED NOT DETECTED Final   Parainfluenza Virus 4 NOT DETECTED NOT DETECTED Final   Respiratory Syncytial Virus NOT DETECTED NOT DETECTED Final   Bordetella pertussis NOT DETECTED NOT DETECTED Final   Chlamydophila pneumoniae NOT DETECTED NOT DETECTED Final   Mycoplasma pneumoniae NOT DETECTED NOT DETECTED Final    Comment: Performed at [facility], [place]. [address] [place], [place] [address]         Radiology Studies: No results found.      Scheduled Meds: . aspirin  81 mg Oral Daily  . chlorhexidine gluconate (MEDLINE KIT)  15 mL Mouth Rinse BID  . [START ON 08/05/2018] heparin  5,000 Units Subcutaneous Q8H  . ipratropium  0.5 mg Nebulization TID  . levalbuterol  0.63 mg Nebulization TID  . mouth rinse  15 mL Mouth Rinse BID  . pantoprazole  40 mg Oral Daily  . polyethylene glycol  17 g Oral Daily  . ENSURE MAX PROTEIN  11 oz Oral BID  . sacubitril-valsartan  1 tablet Oral BID  . sennosides  5 mL Oral QHS  . sodium chloride flush  10-40 mL Intracatheter Q12H  . sodium chloride flush  3 mL Intravenous Q12H  . sodium chloride flush  3 mL Intravenous Q12H  . spironolactone  12.5 mg Oral QHS   Continuous Infusions: . sodium  chloride    . sodium chloride 50 mL/hr at 08/04/18 1313  . sodium chloride       LOS: 14 days     Vernell Leep, MD, FACP, [facility]. [facility]  To contact the attending provider between 7A-7P or the covering provider during after hours 7P-7A, please log into the web site [email or web address] and access using universal [place] password for that web site. If you do not have the password, please call the hospital  operator.  08/04/2018, 2:58 PM

## 2018-08-04 NOTE — Progress Notes (Signed)
Physical Therapy Treatment Patient Details Name: Kristopher [name] MRN: [id number] DOB: [birth year] Today's Date: 08/04/2018    History of Present Illness Pt adm with acute on chronic systolic/diastolic heart failure, acute hypoxic resp failure. Intubated 2/15, self extubated, re-intubated and extubated 2/19. PMH - GI bleed, pulmonary hypertension, asthma, HTN, DM    PT Comments    Pt admitted with above diagnosis. Pt currently with functional limitations due to balance and endurance deficits. Pt was able to come to EOB with mod assist due to not being able to use the right UE due to CATH today.  He was also able to step to chair with min assist of 2.  Will follow acutely and progress as able.  Pt will benefit from skilled PT to increase their independence and safety with mobility to allow discharge to the venue listed below.     Follow Up Recommendations  SNF;Supervision/Assistance - 24 hour     Equipment Recommendations  Other (comment)(to be determined)    Recommendations for Other Services       Precautions / Restrictions Precautions Precautions: Fall Restrictions Weight Bearing Restrictions: No    Mobility  Bed Mobility Overal bed mobility: Needs Assistance Bed Mobility: Supine to Sit     Supine to sit: +2 for physical assistance;Mod assist     General bed mobility comments: Assist to bring legs off of bed, elevate trunk into sitting, and bring hips to EOB.  Had to cue pt to not use the right UE due to radial CATH earlier today.  Pt did not use the UE.   Transfers Overall transfer level: Needs assistance Equipment used: None Transfers: Sit to/from [employer] Sit to Stand: +2 physical assistance;Min assist Stand pivot transfers: +2 physical assistance;Min assist       General transfer comment: Assist to bring hips up and for balance. Bed to chair  with short shuffling steps.   Pt with wide BOS and slightly unsteady.  Did not want to walk due to radial  cath this am and cannot use his right UE.   Ambulation/Gait                 Stairs             Wheelchair Mobility    Modified Rankin (Stroke Patients Only)       Balance Overall balance assessment: Needs assistance Sitting-balance support: No upper extremity supported;Feet supported Sitting balance-Leahy Scale: Fair     Standing balance support: No upper extremity supported;During functional activity Standing balance-Leahy Scale: Poor Standing balance comment:  min assist for static standing                            Cognition Arousal/Alertness: Awake/alert Behavior During Therapy: WFL for tasks assessed/performed Overall Cognitive Status: Within Functional Limits for tasks assessed                                        Exercises General Exercises - Lower Extremity Ankle Circles/Pumps: AROM;Both;5 reps;Seated Long Arc Quad: AROM;Both;10 reps;Seated    General Comments        Pertinent Vitals/Pain Pain Assessment: No/denies pain    Home Living                      Prior Function  PT Goals (current goals can now be found in the care plan section) Acute Rehab PT Goals Patient Stated Goal: Be independent Progress towards PT goals: Progressing toward goals    Frequency    Min 2X/week      PT Plan Current plan remains appropriate    Co-evaluation              AM-PAC PT "6 Clicks" Mobility   Outcome Measure  Help needed turning from your back to your side while in a flat bed without using bedrails?: A Lot Help needed moving from lying on your back to sitting on the side of a flat bed without using bedrails?: A Lot Help needed moving to and from a bed to a chair (including a wheelchair)?: A Little Help needed standing up from a chair using your arms (e.g., wheelchair or bedside chair)?: A Little Help needed to walk in hospital room?: A Lot Help needed climbing 3-5 steps with a  railing? : Total 6 Click Score: 13    End of Session Equipment Utilized During Treatment: Oxygen;Gait belt Activity Tolerance: Patient limited by fatigue Patient left: in chair;with call bell/phone within reach;with chair alarm set Nurse Communication: Mobility status PT Visit Diagnosis: Unsteadiness on feet (R26.81);Other abnormalities of gait and mobility (R26.89);Muscle weakness (generalized) (M62.81)     Time: 5789-7847 PT Time Calculation (min) (ACUTE ONLY): 18 min  Charges:  $Therapeutic Activity: 8-22 mins                     Joia Doyle,PT Acute Rehabilitation Services Pager:  [phone]  Office:  [phone]     Berline Lopes 08/04/2018, 4:15 PM

## 2018-08-04 NOTE — Progress Notes (Signed)
CSW met with patient to discuss bed offers. CSW provided list to patient and highlighted the facilities who had already accepted the patient. CSW discussed how the patient will likely be ready tomorrow, and patient said he will call his daughter to talk with her about the options.   CSW to follow.  Laveda Abbe, [place] Clinical Social Worker [phone]

## 2018-08-05 LAB — BASIC METABOLIC PANEL
Anion gap: 8 (ref 5–15)
BUN: 10 mg/dL (ref 8–23)
CO2: 33 mmol/L — ABNORMAL HIGH (ref 22–32)
Calcium: 8.7 mg/dL — ABNORMAL LOW (ref 8.9–10.3)
Chloride: 101 mmol/L (ref 98–111)
Creatinine, Ser: 0.74 mg/dL (ref 0.61–1.24)
GFR calc Af Amer: >60 mL/min (ref >60)
GLUCOSE: 99 mg/dL (ref 70–99)
Potassium: 3.7 mmol/L (ref 3.5–5.1)
Sodium: 142 mmol/L (ref 135–145)

## 2018-08-05 LAB — CBC WITH DIFFERENTIAL/PLATELET
Abs Immature Granulocytes: 0.12 10*3/uL — ABNORMAL HIGH (ref 0.00–0.07)
BASOS PCT: 0 %
Basophils Absolute: 0 10*3/uL (ref 0.0–0.1)
Eosinophils Absolute: 0.2 10*3/uL (ref 0.0–0.5)
Eosinophils Relative: 1 %
HCT: 43.1 % (ref 39.0–52.0)
Hemoglobin: 12 g/dL — ABNORMAL LOW (ref 13.0–17.0)
Immature Granulocytes: 1 %
Lymphocytes Relative: 8 %
Lymphs Abs: 1.4 10*3/uL (ref 0.7–4.0)
MCH: 25.9 pg — ABNORMAL LOW (ref 26.0–34.0)
MCHC: 27.8 g/dL — ABNORMAL LOW (ref 30.0–36.0)
MCV: 92.9 fL (ref 80.0–100.0)
MONOS PCT: 6 %
Monocytes Absolute: 1 10*3/uL (ref 0.1–1.0)
NEUTROS PCT: 84 %
Neutro Abs: 15.1 10*3/uL — ABNORMAL HIGH (ref 1.7–7.7)
PLATELETS: 267 10*3/uL (ref 150–400)
RBC: 4.64 MIL/uL (ref 4.22–5.81)
RDW: 18.1 % — ABNORMAL HIGH (ref 11.5–15.5)
WBC: 17.7 10*3/uL — ABNORMAL HIGH (ref 4.0–10.5)
nRBC: 0 % (ref 0.0–0.2)

## 2018-08-05 LAB — PHOSPHORUS: Phosphorus: 3.1 mg/dL (ref 2.5–4.6)

## 2018-08-05 LAB — MAGNESIUM: Magnesium: 2.2 mg/dL (ref 1.7–2.4)

## 2018-08-05 MED ORDER — ATORVASTATIN CALCIUM 40 MG PO TABS
40.0000 mg | ORAL_TABLET | Freq: Every day | ORAL | Status: DC
  Administered 2018-08-05 – 2018-08-07 (×3): 40 mg via ORAL
  Filled 2018-08-05: qty 1
  Filled 2018-08-05: qty 4
  Filled 2018-08-05: qty 1

## 2018-08-05 MED ORDER — CARVEDILOL 3.125 MG PO TABS
3.1250 mg | ORAL_TABLET | Freq: Two times a day (BID) | ORAL | Status: DC
  Administered 2018-08-05 – 2018-08-08 (×7): 3.125 mg via ORAL
  Filled 2018-08-05 (×6): qty 1

## 2018-08-05 MED ORDER — POTASSIUM CHLORIDE CRYS ER 20 MEQ PO TBCR
20.0000 meq | EXTENDED_RELEASE_TABLET | Freq: Once | ORAL | Status: AC
  Administered 2018-08-05: 20 meq via ORAL
  Filled 2018-08-05: qty 1

## 2018-08-05 MED ORDER — FUROSEMIDE 40 MG PO TABS
60.0000 mg | ORAL_TABLET | Freq: Every day | ORAL | Status: DC
  Administered 2018-08-06 – 2018-08-08 (×3): 60 mg via ORAL
  Filled 2018-08-05 (×3): qty 1

## 2018-08-05 MED ORDER — FUROSEMIDE 40 MG PO TABS
40.0000 mg | ORAL_TABLET | Freq: Every day | ORAL | Status: DC
  Administered 2018-08-05: 40 mg via ORAL
  Filled 2018-08-05: qty 1

## 2018-08-05 MED ORDER — FUROSEMIDE 20 MG PO TABS
20.0000 mg | ORAL_TABLET | Freq: Once | ORAL | Status: AC
  Administered 2018-08-05: 20 mg via ORAL
  Filled 2018-08-05: qty 1

## 2018-08-05 MED ORDER — MENTHOL 3 MG MT LOZG
1.0000 | LOZENGE | OROMUCOSAL | Status: DC | PRN
  Administered 2018-08-05: 3 mg via ORAL
  Filled 2018-08-05: qty 9

## 2018-08-05 NOTE — Progress Notes (Addendum)
Patient ID: Kristopher [name], male   DOB: 1951-02-04, 68 y.o.   MRN: [id number]     Advanced Heart Failure Rounding Note  PCP-Cardiologist: Fransico Him, MD   Subjective:    Extubated 07/30/18. Remains on 4-6 L [place].   Lasix held yesterday with low CVP and resumption of Entresto. Had [facility]. See below. PCWP mildly elevated at 21. SBP 120-130s. CVP today ~8 with a lot of respiratory variation.   WBC 17.7. Off steroids.   Denies CP or SOB. Has cough with clear sputum. Refused CPAP last night because his throat was sore. Walked today with PT with high O2 requirement.  [facility] 08/04/18  Prox LAD lesion is 30% stenosed.  Prox RCA to Mid RCA lesion is 30% stenosed.  Prox Cx lesion is 30% stenosed. Findings: Ao = 107/69 (87)  LV =  119/13 RA =  5 RV = 64/11 PA = 71/23 (39) PCW = 21 Fick cardiac output/index = 5.6/2.6 PVR = 3.1 WU Ao sat = 94% PA sat = 64%, 65% Assessment: 1. Minimal non-obstructive CAD 2. NICM EF 30-35% 3. Moderate pulmonary HTN due primarily to OSA/OHS 4. Relative well-compensated volume status  Echo 07/22/18: EF 25-30%, global HK, RV normal function, increased wall thickness, LA moderately dilated, RA severely dilated  Objective:   Weight Range: 106.8 kg Body mass index is 38.01 kg/m.   Vital Signs:   Temp:  [97.9 F (36.6 C)] 97.9 F (36.6 C) (02/25 0508) Pulse Rate:  [0-201] 96 (02/25 0849) Resp:  [0-55] 0 (02/25 0810) BP: (121-159)/(68-118) 133/74 (02/25 0810) SpO2:  [0 %-100 %] 97 % (02/25 0810) Weight:  [106.8 kg] 106.8 kg (02/25 0513) Last BM Date: 08/05/18  Weight change: Filed Weights   08/02/18 0359 08/04/18 0510 08/05/18 0513  Weight: 94.5 kg 105.8 kg 106.8 kg   Intake/Output:   Intake/Output Summary (Last 24 hours) at 08/05/2018 0916 Last data filed at 08/05/2018 0824 Gross per 24 hour  Intake 370 ml  Output 1000 ml  Net -630 ml    Physical Exam    General: No resp difficulty. HEENT: Normal Neck: Supple. JVP ~8. Carotids 2+  bilat; no bruits. No thyromegaly or nodule noted. Cor: PMI nondisplaced. RRR, No M/G/R noted Lungs: clear, distant. On 4 L [place].  Abdomen: Obese Soft, non-tender, non-distended, no HSM. No bruits or masses. +BS  Extremities: No cyanosis, clubbing, or rash. R and LLE no edema.  Neuro: Alert & orientedx3, cranial nerves grossly intact. moves all 4 extremities w/o difficulty. Affect pleasant  Telemetry   Sins tach 100s. Personally reviewed.   EKG    No new tracings.    Labs    CBC Recent Labs    08/04/18 0555  08/04/18 1126 08/05/18 0455  WBC 17.4*  --   --  17.7*  NEUTROABS 14.1*  --   --  15.1*  HGB 12.9*   < > 13.9  13.6 12.0*  HCT 45.1   < > 41.0  40.0 43.1  MCV 91.3  --   --  92.9  PLT 267  --   --  267   < > = values in this interval not displayed.   Basic Metabolic Panel Recent Labs    08/04/18 0555  08/04/18 1126 08/05/18 0455  NA 142   < > 144  146* 142  K 3.8   < > 3.9  3.6 3.7  CL 100  --   --  101  CO2 32  --   --  33*  GLUCOSE 110*  --   --  99  BUN 17  --   --  10  CREATININE 0.91  --   --  0.74  CALCIUM 9.2  --   --  8.7*  MG 2.1  --   --  2.2  PHOS 3.7  --   --  3.1   < > = values in this interval not displayed.   Liver Function Tests No results for input(s): AST, ALT, ALKPHOS, BILITOT, PROT, ALBUMIN in the last 72 hours. No results for input(s): LIPASE, AMYLASE in the last 72 hours. Cardiac Enzymes No results for input(s): CKTOTAL, CKMB, CKMBINDEX, TROPONINI in the last 72 hours.  BNP: BNP (last 3 results) Recent Labs    07/21/18 1123  BNP 964.4*   ProBNP (last 3 results) No results for input(s): PROBNP in the last 8760 hours.  D-Dimer No results for input(s): DDIMER in the last 72 hours. Hemoglobin A1C No results for input(s): HGBA1C in the last 72 hours. Fasting Lipid Panel No results for input(s): CHOL, HDL, LDLCALC, TRIG, CHOLHDL, LDLDIRECT in the last 72 hours. Thyroid Function Tests No results for input(s): TSH, T4TOTAL,  T3FREE, THYROIDAB in the last 72 hours.  Invalid input(s): FREET3  Other results:  Imaging    No results found.  Medications:     Scheduled Medications: . aspirin  81 mg Oral Daily  . chlorhexidine gluconate (MEDLINE KIT)  15 mL Mouth Rinse BID  . heparin  5,000 Units Subcutaneous Q8H  . ipratropium  0.5 mg Nebulization TID  . levalbuterol  0.63 mg Nebulization TID  . mouth rinse  15 mL Mouth Rinse BID  . pantoprazole  40 mg Oral Daily  . polyethylene glycol  17 g Oral Daily  . ENSURE MAX PROTEIN  11 oz Oral BID  . sacubitril-valsartan  1 tablet Oral BID  . sennosides  5 mL Oral QHS  . sodium chloride flush  10-40 mL Intracatheter Q12H  . sodium chloride flush  3 mL Intravenous Q12H  . sodium chloride flush  3 mL Intravenous Q12H  . spironolactone  12.5 mg Oral QHS    Infusions: . sodium chloride    . sodium chloride      PRN Medications: sodium chloride, sodium chloride, acetaminophen, bisacodyl, guaiFENesin-dextromethorphan, levalbuterol, ondansetron (ZOFRAN) IV, sodium chloride flush, sodium chloride flush, sodium chloride flush  Patient Profile   Kristopher [name] is a 68 year old with history of obesity, HTN, DM, asthma and chronic diastolic/systolic heart failure due to NICM with EF 45%.   Assessment/Plan  1. Acute on chronic systolic/diastolic HF - Echo 12/20/17 EF 45%. Cath with minimal CAD - Echo 07/22/18: EF 25-30%, global HK, RV normal function, increased wall thickness, LA moderately dilated, RA severely dilated - [facility] 2/24 with minimal non-obstructive CAD, NICM EF 30-35%, moderate pulmonary HTN due primarily to OSA/OHS, and relative well-compensated volume status - Volume okay. CVP 8 - Restart lasix 40 mg daily - Continue Entresto 24/26 mg BID.  - Continue spiro 12.5 mg daily - Restart coreg 3.125 mg BID. SBP 120-130s. Output stable on cath yesterday.  - This admit mostly secondary to COPD flare but likely component of HF. LHC showed minimal nonobstructive  CAD and relatively well compensated filling pressures.   2. Acute hypoxic respiratory failure - Likely combination of HF and AECOPD. - Extubated 2/19. Now on 4 L [place]. He was not on O2 PTA. Will need to go to SNF with O2. Per primary.   3.  Undiagnosed OSA - Will need outpatient sleep study. Will likely need to set him up with BiPAP prior to DC. No change. Will also need home O2.  - Refuses CPAP due to sore throat. We talked about how he needs to wear qHS.   4. DM2 - Per primary team - Consider SGLT2i at d/c. No change.   5. AKI on CKD III - Resolved. Cr peaked at 2.28. Creatinine 0.74.   6. Mild nonobstructive CAD - On LHC 2/24 - Continue ASA 81 mg daily for now - Start lipitor 40 mg daily  Planning for SNF at discharge. I think he is stable from HF standpoint. He has hospital f/u.  Heart failure team will sign off as of 08/05/18. Please wait for Dr Haroldine Laws to confirm medications for discharge.   HF Team Medication Recommendations for Home: ASA 81 mg daily Atorvastatin 40 mg daily Lasix 60 mg daily Coreg 3.125 mg BID Potassium 20 meq daily Entresto 24/26 mg BID Spiro 12.5 mg daily  Other recommendations (Labs,testing, etc): BMET at follow up.   Follow up as an outpatient: 3/4 2:30 pm.    Georgiana Shore, NP  9:16 AM  Advanced Heart Failure Team Pager [phone] (M-F; 7a - 4p)  Please contact [facility] for night-coverage after hours (4p -7a ) and weekends on [email or web address]  Patient seen and examined with the above-signed Advanced Practice Provider and/or Housestaff. I personally reviewed laboratory data, imaging studies and relevant notes. I independently examined the patient and formulated the important aspects of the plan. I have edited the note to reflect any of my changes or salient points. I have personally discussed the plan with the patient and/or family.  Cath results reviewed with him. Volume status minimally elevated. Will increase lasix to 60 daily. Be  careful not to overdiurese. Main issue is OHS/OSA. Primary team discussing with Pulmonary to discuss need for BIPAP.   We will follow at a distance.   Glori Bickers, MD  3:39 PM

## 2018-08-05 NOTE — Social Work (Signed)
Patient and daughter chose [facility] SNF. [facility] started patient's [facility] authorization; will need approval before admitting to the facility. SNF will order Bipap, provided settings.  Abigail Butts, LCSW [phone]

## 2018-08-05 NOTE — Care Management Note (Signed)
Case Management Note  Patient Details  Name: Kristopher [name] MRN: [id number] Date of Birth: 1950-11-25  Subjective/Objective: Pt presented for SOB and swelling. PTA from home with support of son. Patient is being followed by CSW for transition of care needs to facility.                    Action/Plan: CM received referral for BIPAP/ Trilogy. CSW did make SNF aware that patient will need BIPAP. CM will continue to monitor for additional transition of care needs.   Expected Discharge Date:                  Expected Discharge Plan:  Skilled Nursing Facility  In-House Referral:  NA  Discharge planning Services  CM Consult  Post Acute Care Choice:  NA Choice offered to:  NA  DME Arranged:  N/A DME Agency:  NA  HH Arranged:  NA HH Agency:  NA  Status of Service:  Completed, signed off  If discussed at Long Length of Stay Meetings, dates discussed:    Additional Comments:  Gala Lewandowsky, RN 08/05/2018, 3:34 PM

## 2018-08-05 NOTE — Progress Notes (Signed)
PROGRESS NOTE   Kristopher [name]  GEX:[id number]    DOB: [birth year]    DOA: 07/21/2018  PCP: Maurice Small, MD   I have briefly reviewed patients previous medical records in [facility].  Brief Narrative:  67 year old male, PMH of dilated cardiomyopathy, chronic combined systolic and diastolic CHF, pulmonary hypertension, CAD, HTN, LBBB, asthma/COPD, esophagitis/PUD/upper GI bleed, prediabetes, presented to ED on 07/21/2018 due to progressive weight gain and dyspnea.  He was initially admitted to the floor for decompensated CHF.  On 2/14, he decompensated with hypercapnia and acute encephalopathy, transferred to stepdown and started on BiPAP but had to be transferred to ICU for intubation and mechanical ventilation.  CCM took over care.  Advanced HF team consulted.  Extubated 2/19.  Improved and stabilized and transferred to progressive care unit under [facility] care on 2/22.  Cardiology continue to see and performed left and right heart cath 2/24.  Slowly improving.  Persistent hypoxia.   Assessment & Plan:   Principal Problem:   Acute on chronic combined systolic and diastolic congestive heart failure, NYHA class 4 (HCC) Active Problems:   DOE (dyspnea on exertion)   Chronic combined systolic and diastolic heart failure (HCC)   HTN (hypertension)   DCM (dilated cardiomyopathy) (HCC)   PUD (peptic ulcer disease)   Pulmonary HTN ([place])   1. Acute on chronic combined systolic and diastolic CHF: Advanced HF team consulting and assisting with care.  TTE 07/22/2018: LVEF 25-30%.  Treated early on in admission with IV Lasix, Diamox and metolazone.  - 10.8 L since admission.  Currently on Lasix 40 mg daily, Aldactone 12.5 mg at bedtime and Entresto which was briefly held has been resumed.  As per cardiology, due to new low EF, underwent right and left heart cath on 2/24, report as below, minimal nonobstructive CAD, NICM EF 30-35%, moderate pulmonary hypertension due primarily due to OSA/OHS and well  compensated volume status.  Lasix held this morning with plans to start [facility].  Carvedilol 3.125 mg twice daily started.  Patient overall feels poorly and does not think that he can DC today.  Moreover PT and OT recommend SNF. I discussed with Dr. Haroldine Laws who may increase his Lasix today and sign off today. 2. Acute on chronic hypoxic and hypercapnic respiratory failure: Likely multifactorial, COPD exacerbation, decompensated CHF, questionable pneumonia over probable OSA/OHS.  Extubated 2/19.  Aggressive pulmonary toilet.  Has been on nightly BiPAP for the last couple of days but refused last night d/t hoarseness.  Minimize opioids.  Tracheal aspirate culture showed diphtheroids.  Has completed 7 days course of antibiotics (azithromycin/doxycycline)-discontinued.  Needs outpatient sleep study, BiPAP at discharge.  Case management consulted to see if he qualifies for Trelegy at SNF.  Persistently hypoxic on room air at rest and requires 6 L/min oxygen to come up to 88%. 3. Suspected OSA/OHS: Will need outpatient sleep study. 4. Acute on stage III chronic kidney disease: Last known baseline creatinine in May 2018 was in 1.1-1.2 range.  Presented with creatinine of 1.5.  This peaked to 2.70 this admission.  Creatinine has normalized.  Follow BMP closely. 5. Acute metabolic encephalopathy: Resolved. 6. Prediabetes: A1c 6.  CBGs well controlled in the hospital and not requiring much insulins.  DC CBG checks and SSI.  Outpatient follow-up. 7. Essential hypertension: Controlled.  Now started on low-dose carvedilol. 8. History of PUD/esophagitis/upper GI bleed: Continue PPI. 9. Deconditioning: PT and OT recommend SNF pending clinical improvement.  Patient agreeable to SNF and clinical social  work working on it. 10. Pulmonary hypertension: Moderate pulmonary hypertension by heart cath 2/24. 11. Constipation: Aggressive bowel regimen.  Last BM 2/ 24 12. Leukocytosis: May be due to recent steroids.  No  overt infectious etiology.  Continues to improve.  Follow CBCs. 13. Hypernatremia: Related to recent aggressive diuresis.  Resolved. 14. Hypokalemia: Replaced.  Magnesium normal.   DVT prophylaxis: Subcutaneous heparin Code Status: Full Family Communication: None at bedside Disposition: DC to SNF pending clinical improvement, possibly 2/25   Consultants:  PCCM Cardiology  Procedures:  ECHO 2/11 >> LVEF severely reduced 25-30%, RVSP ~28.6, LA moderately dilated, RA severely dilated, grade I diastolic dysfunction  Central line: RN discussed with cardiology on 2/22 who wished to keep the central line at this time.  He is undergoing CVP checks.  Right and left heart cath 2/24:  Assessment: 1. Minimal non-obstructive CAD 2. NICM EF 30-35% 3. Moderate pulmonary HTN due primarily to OSA/OHS 4. Relative well-compensated volume status  Plan/Discussion:  Continue medical therapy. Will need aggressive management of OHS/OSA.   Antimicrobials:  Doxycycline 2/15 >> 2/22 Azithromycin 2/15 >> 2/17   Subjective: States that he has hoarseness today.  Thereby did not use BiPAP last night.  Had BM today.  Denies dyspnea or chest pain.  Agreeable to SNF.  ROS: As above, otherwise negative  Objective:  Vitals:   08/05/18 0809 08/05/18 0810 08/05/18 0849 08/05/18 1405  BP: 133/74 133/74    Pulse: (!) 102  96 94  Resp:  (!) 0  20  Temp:      TempSrc:      SpO2: 98% 97%    Weight:      Height:        Examination:  General exam: Pleasant middle-age male, moderately built and nourished lying comfortably propped up in bed.  Did not appear in any distress.  Slightly hoarse. Respiratory system: Clear to auscultation.  No increased work of breathing.  Stable.  Cardiovascular system: S1 & S2 heard, RRR. No JVD, murmurs, rubs, gallops or clicks.  Trace bilateral ankle edema.  Telemetry personally reviewed: Sinus rhythm with BBB morphology.   Gastrointestinal system: Abdomen is  nondistended, soft and nontender. No organomegaly or masses felt. Normal bowel sounds heard.  Stable. Central nervous system: Alert and oriented. No focal neurological deficits.  Stable. Extremities: Symmetric 5 x 5 power. Skin: No rashes, lesions or ulcers Psychiatry: Judgement and insight appear normal. Mood & affect appropriate.     Data Reviewed: I have personally reviewed following labs and imaging studies  CBC: Recent Labs  Lab 08/01/18 0826 08/02/18 0620 08/03/18 0623 08/04/18 0555 08/04/18 1121 08/04/18 1126 08/05/18 0455  WBC 26.7* 23.5* 19.6* 17.4*  --   --  17.7*  NEUTROABS 23.9* 20.4* 16.6* 14.1*  --   --  15.1*  HGB 13.7 12.9* 12.9* 12.9* 13.6 13.9  13.6 12.0*  HCT 48.4 45.4 46.1 45.1 40.0 41.0  40.0 43.1  MCV 90.6 91.3 90.4 91.3  --   --  92.9  PLT 220 264 263 267  --   --  423   Basic Metabolic Panel: Recent Labs  Lab 08/01/18 0826 08/02/18 0600 08/03/18 0623 08/04/18 0555 08/04/18 1121 08/04/18 1126 08/05/18 0455  NA 146* 147* 144 142 144 144  146* 142  K 3.8 3.3* 3.9 3.8 3.7 3.9  3.6 3.7  CL 107 105 104 100  --   --  101  CO2 31 32 29 32  --   --  33*  GLUCOSE 172* 108* 125* 110*  --   --  99  BUN 80* 49* 32* 17  --   --  10  CREATININE 1.43* 1.04 1.01 0.91  --   --  0.74  CALCIUM 9.6 9.4 9.4 9.2  --   --  8.7*  MG 2.4 2.3 2.1 2.1  --   --  2.2  PHOS 3.0 3.6 3.7 3.7  --   --  3.1   Liver Function Tests: Recent Labs  Lab 07/30/18 0429  AST 22  ALT 13  ALKPHOS 42  BILITOT 1.7*  PROT 6.2*  ALBUMIN 3.1*   CBG: Recent Labs  Lab 08/02/18 1119 08/02/18 1616 08/02/18 2102 08/03/18 0748 08/03/18 2236  GLUCAP 98 108* 126* 135* 110*    Recent Results (from the past 240 hour(s))  Respiratory Panel by PCR     Status: None   Collection Time: 07/26/18  5:32 PM  Result Value Ref Range Status   Adenovirus NOT DETECTED NOT DETECTED Final   Coronavirus 229E NOT DETECTED NOT DETECTED Final    Comment: (NOTE) The Coronavirus on the  Respiratory Panel, DOES NOT test for the novel  Coronavirus (2019 nCoV)    Coronavirus HKU1 NOT DETECTED NOT DETECTED Final   Coronavirus NL63 NOT DETECTED NOT DETECTED Final   Coronavirus OC43 NOT DETECTED NOT DETECTED Final   Metapneumovirus NOT DETECTED NOT DETECTED Final   Rhinovirus / Enterovirus NOT DETECTED NOT DETECTED Final   Influenza A NOT DETECTED NOT DETECTED Final   Influenza B NOT DETECTED NOT DETECTED Final   Parainfluenza Virus 1 NOT DETECTED NOT DETECTED Final   Parainfluenza Virus 2 NOT DETECTED NOT DETECTED Final   Parainfluenza Virus 3 NOT DETECTED NOT DETECTED Final   Parainfluenza Virus 4 NOT DETECTED NOT DETECTED Final   Respiratory Syncytial Virus NOT DETECTED NOT DETECTED Final   Bordetella pertussis NOT DETECTED NOT DETECTED Final   Chlamydophila pneumoniae NOT DETECTED NOT DETECTED Final   Mycoplasma pneumoniae NOT DETECTED NOT DETECTED Final    Comment: Performed at [facility], [place]. [address], [place], [place] [address]         Radiology Studies: No results found.      Scheduled Meds: . aspirin  81 mg Oral Daily  . atorvastatin  40 mg Oral q1800  . carvedilol  3.125 mg Oral BID WC  . chlorhexidine gluconate (MEDLINE KIT)  15 mL Mouth Rinse BID  . furosemide  40 mg Oral Daily  . heparin  5,000 Units Subcutaneous Q8H  . ipratropium  0.5 mg Nebulization TID  . levalbuterol  0.63 mg Nebulization TID  . mouth rinse  15 mL Mouth Rinse BID  . pantoprazole  40 mg Oral Daily  . polyethylene glycol  17 g Oral Daily  . ENSURE MAX PROTEIN  11 oz Oral BID  . sacubitril-valsartan  1 tablet Oral BID  . sennosides  5 mL Oral QHS  . sodium chloride flush  10-40 mL Intracatheter Q12H  . sodium chloride flush  3 mL Intravenous Q12H  . sodium chloride flush  3 mL Intravenous Q12H  . spironolactone  12.5 mg Oral QHS   Continuous Infusions: . sodium chloride    . sodium chloride       LOS: 15 days     Vernell Leep, MD, FACP, [facility]. [facility]  To contact the attending provider between 7A-7P or the covering provider during after hours 7P-7A, please log into the web site [email or web address] and access  using universal [place] password for that web site. If you do not have the password, please call the hospital operator.  08/05/2018, 2:55 PM

## 2018-08-05 NOTE — Progress Notes (Signed)
Occupational Therapy Treatment Patient Details Name: Kristopher [name] MRN: [id number] DOB: Nov 03, 1950 Today's Date: 08/05/2018    History of present illness Pt adm with acute on chronic systolic/diastolic heart failure, acute hypoxic resp failure. Intubated 2/15, self extubated, re-intubated and extubated 2/19. PMH - GI bleed, pulmonary hypertension, asthma, HTN, DM   OT comments  This 68 yo male admitted with above presents to acute OT making great progress from a basic ADL standpoint. Pt currently at Min A level or better with big obstacle being that he is requiring 6 liters of O2 to maintain sats above 88%. He will continue to benefit from acute OT with follow up at SNF.  Follow Up Recommendations  SNF;Supervision/Assistance - 24 hour    Equipment Recommendations  3 in 1 bedside commode       Precautions / Restrictions Precautions Precautions: Fall Precaution Comments: cannot use RUE until after 12:16 pm today (08/05/2018) Restrictions Weight Bearing Restrictions: No       Mobility Bed Mobility Overal bed mobility: Needs Assistance Bed Mobility: Supine to Sit     Supine to sit: Min guard;HOB elevated        Transfers Overall transfer level: Needs assistance Equipment used: 1 person hand held assist Transfers: Sit to/from Stand Sit to Stand: Min assist              Balance Overall balance assessment: Needs assistance Sitting-balance support: No upper extremity supported;Feet supported Sitting balance-Leahy Scale: Good     Standing balance support: No upper extremity supported;During functional activity Standing balance-Leahy Scale: Fair                             ADL either performed or assessed with clinical judgement   ADL Overall ADL's : Needs assistance/impaired     Grooming: Wash/dry hands;Wash/dry face;Oral care;Min guard;Standing                 Lower Body Dressing Details (indicate cue type and reason): Pt can cross legs to get  to feet for socks Toilet Transfer: Minimal assistance;+2 for safety/equipment;Ambulation Toilet Transfer Details (indicate cue type and reason): bed>sink>around bed to recliner Toileting- Clothing Manipulation and Hygiene: Total assistance Toileting - Clothing Manipulation Details (indicate cue type and reason): for back peri care due to cannot use RUE at this time and cannot do it left handed per pt             Vision Baseline Vision/History: No visual deficits Patient Visual Report: No change from baseline            Cognition Arousal/Alertness: Awake/alert Behavior During Therapy: WFL for tasks assessed/performed Overall Cognitive Status: Within Functional Limits for tasks assessed                                          Exercises Other Exercises Other Exercises: Pt performed 5 reps of stand to partial sit (hold) for 5-10 seconds and did 3 forward and backward walks (5 feet each)           Pertinent Vitals/ Pain       Pain Assessment: No/denies pain     Prior Functioning/Environment              Frequency  Min 2X/week        Progress Toward Goals  OT Goals(current goals can now be  found in the care plan section)  Progress towards OT goals: Progressing toward goals     Plan Discharge plan remains appropriate    Co-evaluation    PT/OT/SLP Co-Evaluation/Treatment: Yes Reason for Co-Treatment: For patient/therapist safety;To address functional/ADL transfers PT goals addressed during session: Mobility/safety with mobility;Balance;Strengthening/ROM OT goals addressed during session: Strengthening/ROM;ADL's and self-care      AM-PAC OT "6 Clicks" Daily Activity     Outcome Measure   Help from another person eating meals?: None Help from another person taking care of personal grooming?: A Little Help from another person toileting, which includes using toliet, bedpan, or urinal?: A Little Help from another person bathing (including  washing, rinsing, drying)?: A Little Help from another person to put on and taking off regular upper body clothing?: A Little Help from another person to put on and taking off regular lower body clothing?: A Little 6 Click Score: 19    End of Session Equipment Utilized During Treatment: Gait belt;Rolling walker(6 liters)  OT Visit Diagnosis: Unsteadiness on feet (R26.81);Muscle weakness (generalized) (M62.81)   Activity Tolerance Patient tolerated treatment well   Patient Left in chair;with call bell/phone within reach;with chair alarm set   Nurse Communication (pt requesting throat losenges)        Time: 3710-6269 OT Time Calculation (min): 32 min  Charges: OT General Charges $OT Visit: 1 Visit OT Treatments $Self Care/Home Management : 8-22 mins  Ignacia Palma, OTR/L Acute [employer] Pager [phone] Office [phone]      Evette Georges 08/05/2018, 10:38 AM

## 2018-08-05 NOTE — Progress Notes (Signed)
Spoke with TEE RN and she is aware of the DC central line order

## 2018-08-05 NOTE — Progress Notes (Signed)
SATURATION QUALIFICATIONS: (This note is used to comply with regulatory documentation for home oxygen)  Patient Saturations on Room Air at Rest = 80%  Patient Saturations on Room Air while Ambulating = NT due to drop on RA at rest  Patient Saturations on 6 Liters of oxygen while Ambulating = 88%  Please briefly explain why patient needs home oxygen:Pt needs O2 at rest and with activity.  THanks.  Torry Adamczak,PT Acute Rehabilitation Services Pager:  [phone]  Office:  [phone]

## 2018-08-05 NOTE — Progress Notes (Signed)
Physical Therapy Treatment Patient Details Name: Kristopher [name] MRN: [id number] DOB: 02-07-1951 Today's Date: 08/05/2018    History of Present Illness Pt adm with acute on chronic systolic/diastolic heart failure, acute hypoxic resp failure. Intubated 2/15, self extubated, re-intubated and extubated 2/19. PMH - GI bleed, pulmonary hypertension, asthma, HTN, DM    PT Comments    Pt admitted with above diagnosis. Pt currently with functional limitations due to the deficits listed below (see PT Problem List). Pt was able to ambulate with PT and OT with min assist with 1HHA.  Pt progressing. Still needed 6LO2 to keep sats >88% with activity.  Pt tolerated incr activity and needs rest breaks due to fatigue.   Pt will benefit from skilled PT to increase their independence and safety with mobility to allow discharge to the venue listed below.    SATURATION QUALIFICATIONS: (This note is used to comply with regulatory documentation for home oxygen)  Patient Saturations on Room Air at Rest = 80%  Patient Saturations on Room Air while Ambulating = NT due to drop on RA at rest  Patient Saturations on 6 Liters of oxygen while Ambulating = 88%  Please briefly explain why patient needs home oxygen:Pt needs O2 at rest and with activity.   Follow Up Recommendations  SNF;Supervision/Assistance - 24 hour     Equipment Recommendations  Other (comment)(to be determined)    Recommendations for Other Services       Precautions / Restrictions Precautions Precautions: Fall Precaution Comments: cannot use RUE until after 12:16 pm today (08/05/2018) Restrictions Weight Bearing Restrictions: No    Mobility  Bed Mobility Overal bed mobility: Needs Assistance Bed Mobility: Supine to Sit     Supine to sit: Min guard;HOB elevated     General bed mobility comments: Did notneed assist to bring legs off of bed or to elevate trunk.  Did not use the right UE due to continued precautions after radial CATH  yesterday.  Transfers Overall transfer level: Needs assistance Equipment used: 1 person hand held assist Transfers: Sit to/from Stand Sit to Stand: Min assist         General transfer comment: STeadying assist initially but once pt up, able to stand with min guard assist.   Ambulation/Gait Ambulation/Gait assistance: Min assist;+2 safety/equipment Gait Distance (Feet): 60 Feet(25 x 2, 5 x 2) Assistive device: 1 person hand held assist Gait Pattern/deviations: Step-through pattern;Decreased stride length;Wide base of support   Gait velocity interpretation: <1.31 ft/sec, indicative of household ambulator General Gait Details: Pt ambulating much better today with better stability.  Needed HHA on left UE at times.  Also weight bear on right elbow occasionally. Went to sink and brushed teeth and stood for 10 min with min guard assist.  Then ambulated to chair and performed forward and backward walking.  Desat on RA to 80%.  Needed 6L to keep sats >88% with activity.     Stairs             Wheelchair Mobility    Modified Rankin (Stroke Patients Only)       Balance Overall balance assessment: Needs assistance Sitting-balance support: No upper extremity supported;Feet supported Sitting balance-Leahy Scale: Good     Standing balance support: No upper extremity supported;During functional activity;Single extremity supported Standing balance-Leahy Scale: Fair Standing balance comment:  min guard assist for static standing  Cognition Arousal/Alertness: Awake/alert Behavior During Therapy: WFL for tasks assessed/performed Overall Cognitive Status: Within Functional Limits for tasks assessed                                        Exercises Other Exercises Other Exercises: Pt performed 5 reps of stand to partial sit (hold) for 5-10 seconds and did 3 forward and backward walks (5 feet each)    General Comments         Pertinent Vitals/Pain Pain Assessment: No/denies pain Faces Pain Scale: No hurt    Home Living                      Prior Function            PT Goals (current goals can now be found in the care plan section) Acute Rehab PT Goals Patient Stated Goal: Be independent Progress towards PT goals: Progressing toward goals    Frequency    Min 2X/week      PT Plan Current plan remains appropriate    Co-evaluation PT/OT/SLP Co-Evaluation/Treatment: Yes Reason for Co-Treatment: For patient/therapist safety PT goals addressed during session: Mobility/safety with mobility OT goals addressed during session: Strengthening/ROM;ADL's and self-care      AM-PAC PT "6 Clicks" Mobility   Outcome Measure  Help needed turning from your back to your side while in a flat bed without using bedrails?: None Help needed moving from lying on your back to sitting on the side of a flat bed without using bedrails?: None Help needed moving to and from a bed to a chair (including a wheelchair)?: A Little Help needed standing up from a chair using your arms (e.g., wheelchair or bedside chair)?: A Little Help needed to walk in hospital room?: A Little Help needed climbing 3-5 steps with a railing? : Total 6 Click Score: 18    End of Session Equipment Utilized During Treatment: Oxygen;Gait belt Activity Tolerance: Patient limited by fatigue Patient left: in chair;with call bell/phone within reach;with chair alarm set Nurse Communication: Mobility status PT Visit Diagnosis: Unsteadiness on feet (R26.81);Other abnormalities of gait and mobility (R26.89);Muscle weakness (generalized) (M62.81)     Time: 2119-4174 PT Time Calculation (min) (ACUTE ONLY): 29 min  Charges:  $Gait Training: 8-22 mins                     Rand Boller,PT Acute Rehabilitation Services Pager:  [phone]  Office:  [phone]     Berline Lopes 08/05/2018, 11:04 AM

## 2018-08-06 LAB — PHOSPHORUS: Phosphorus: 3 mg/dL (ref 2.5–4.6)

## 2018-08-06 LAB — BASIC METABOLIC PANEL
Anion gap: 9 (ref 5–15)
BUN: 16 mg/dL (ref 8–23)
CO2: 35 mmol/L — AB (ref 22–32)
Calcium: 8.9 mg/dL (ref 8.9–10.3)
Chloride: 96 mmol/L — ABNORMAL LOW (ref 98–111)
Creatinine, Ser: 0.84 mg/dL (ref 0.61–1.24)
GFR calc non Af Amer: >60 mL/min (ref >60)
Glucose, Bld: 108 mg/dL — ABNORMAL HIGH (ref 70–99)
Potassium: 3.8 mmol/L (ref 3.5–5.1)
Sodium: 140 mmol/L (ref 135–145)

## 2018-08-06 LAB — BLOOD GAS, ARTERIAL
Acid-Base Excess: 12.2 mmol/L — ABNORMAL HIGH (ref 0.0–2.0)
Bicarbonate: 38.6 mmol/L — ABNORMAL HIGH (ref 20.0–28.0)
Drawn by: 24686
O2 CONTENT: 4 L/min
O2 Saturation: 95.9 %
Patient temperature: 98.6
pCO2 arterial: 77.9 mmHg (ref 32.0–48.0)
pH, Arterial: 7.316 — ABNORMAL LOW (ref 7.350–7.450)
pO2, Arterial: 85.4 mmHg (ref 83.0–108.0)

## 2018-08-06 LAB — CBC WITH DIFFERENTIAL/PLATELET
Abs Immature Granulocytes: 0.12 10*3/uL — ABNORMAL HIGH (ref 0.00–0.07)
Basophils Absolute: 0 10*3/uL (ref 0.0–0.1)
Basophils Relative: 0 %
Eosinophils Absolute: 0.2 10*3/uL (ref 0.0–0.5)
Eosinophils Relative: 1 %
HCT: 44.7 % (ref 39.0–52.0)
Hemoglobin: 12.4 g/dL — ABNORMAL LOW (ref 13.0–17.0)
Immature Granulocytes: 1 %
Lymphocytes Relative: 9 %
Lymphs Abs: 1.4 10*3/uL (ref 0.7–4.0)
MCH: 25.4 pg — ABNORMAL LOW (ref 26.0–34.0)
MCHC: 27.7 g/dL — ABNORMAL LOW (ref 30.0–36.0)
MCV: 91.4 fL (ref 80.0–100.0)
Monocytes Absolute: 0.9 10*3/uL (ref 0.1–1.0)
Monocytes Relative: 6 %
NRBC: 0 % (ref 0.0–0.2)
Neutro Abs: 12.8 10*3/uL — ABNORMAL HIGH (ref 1.7–7.7)
Neutrophils Relative %: 83 %
Platelets: 300 10*3/uL (ref 150–400)
RBC: 4.89 MIL/uL (ref 4.22–5.81)
RDW: 17.2 % — ABNORMAL HIGH (ref 11.5–15.5)
WBC: 15.4 10*3/uL — ABNORMAL HIGH (ref 4.0–10.5)

## 2018-08-06 LAB — GLUCOSE, CAPILLARY: Glucose-Capillary: 112 mg/dL — ABNORMAL HIGH (ref 70–99)

## 2018-08-06 LAB — MAGNESIUM: Magnesium: 2.1 mg/dL (ref 1.7–2.4)

## 2018-08-06 NOTE — Progress Notes (Signed)
PROGRESS NOTE    Kristopher [name]  BOF:[id number] DOB: [birth year] DOA: 07/21/2018 PCP: Maurice Small, MD   Brief Narrative:  68 year old male, PMH of dilated cardiomyopathy, chronic combined systolic and diastolic CHF, pulmonary hypertension, CAD, HTN, LBBB, asthma/COPD, esophagitis/PUD/upper GI bleed, prediabetes, presented to ED on 07/21/2018 due to progressive weight gain and dyspnea.  He was initially admitted to the floor for decompensated CHF.  On 2/14, he decompensated with hypercapnia and acute encephalopathy, transferred to stepdown and started on BiPAP but had to be transferred to ICU for intubation and mechanical ventilation.  CCM took over care.  Advanced HF team consulted.  Extubated 2/19.  Improved and stabilized and transferred to progressive care unit under [facility] care on 2/22.  Cardiology continue to see and performed left and right heart cath 2/24.  Slowly improving.  Persistent hypoxia.   Assessment & Plan:   Principal Problem:   Acute on chronic combined systolic and diastolic congestive heart failure, NYHA class 4 (HCC) Active Problems:   DOE (dyspnea on exertion)   Chronic combined systolic and diastolic heart failure (HCC)   HTN (hypertension)   DCM (dilated cardiomyopathy) (HCC)   PUD (peptic ulcer disease)   Pulmonary HTN ([place])   1. Acute on chronic combined systolic and diastolic CHF: Advanced HF team consulted and assisted with care.  TTE 07/22/2018: LVEF 25-30%.   1. Treated early on in admission with IV Lasix, Diamox and metolazone.   2. - 11.8 L since admission.   3. Currently on Lasix 60 mg daily, Aldactone 12.5 mg, entresto 24/26 BID, coreg 2.125 BID  4. As per cardiology, due to new low EF, underwent right and left heart cath on 2/24, report as below, minimal nonobstructive CAD, NICM EF 30-35%, moderate pulmonary hypertension due primarily due to OSA/OHS and well compensated volume status.  5.  PT and OT recommend SNF.  6. Continue daily labs, developing alkalosis,  possibly contraction, follow  2. Acute on chronic hypoxic and hypercapnic respiratory failure: Likely multifactorial, COPD exacerbation, decompensated CHF, questionable pneumonia over probable OSA/OHS.  Extubated 2/19.  Aggressive pulmonary toilet.  Has been on nightly BiPAP for the last couple of days.  Recently has c/o hoarseness.  Minimize opioids.  Tracheal aspirate culture showed diphtheroids.  Has completed 7 days course of antibiotics (azithromycin/doxycycline)-discontinued.  Needs outpatient sleep study, BiPAP at discharge.  Case management consulted to see if he qualifies for Trelegy at SNF.  Persistently hypoxic on room air at rest and requires 6 L/min oxygen to come up to 88%.  3. Suspected OSA/OHS: Will need outpatient sleep study.  4. Acute on stage III chronic kidney disease: Last known baseline creatinine in May 2018 was in 1.1-1.2 range.  Presented with creatinine of 1.5.  This peaked to 2.70 this admission.  Creatinine has normalized and remains appropriate.  Follow BMP closely.  5. Acute metabolic encephalopathy: Resolved.  6. Prediabetes: A1c 6.  CBGs well controlled in the hospital and not requiring much insulins.  DC CBG checks and SSI.  Outpatient follow-up.  7. Essential hypertension: Controlled.  Continue spiro, lasix, entresto, coreg as noted above.  8. History of PUD/esophagitis/upper GI bleed: Continue PPI.  9. Deconditioning: PT and OT recommend SNF pending clinical improvement.  Patient agreeable to SNF and clinical social work working on it.  10. Pulmonary hypertension: Moderate pulmonary hypertension by heart cath 2/24.  11. Constipation: Aggressive bowel regimen.  Last BM 2/ 24  12. Leukocytosis: May be due to recent steroids.  No overt infectious etiology.  Continues to improve.  Follow CBCs.  13. Hypernatremia: Related to recent aggressive diuresis.  Resolved.  14. Hypokalemia: Replaced.  Magnesium normal.  DVT prophylaxis: heparin Code Status:full    Family Communication: none at bedside Disposition Plan: pending SNF   Consultants:   Cardiology  pccm  Procedures:  Cath  Prox LAD lesion is 30% stenosed.  Prox RCA to Mid RCA lesion is 30% stenosed.  Prox Cx lesion is 30% stenosed.   Findings:  Ao = 107/69 (87)  LV =  119/13 RA =  5 RV = 64/11 PA = 71/23 (39) PCW = 21 Fick cardiac output/index = 5.6/2.6 PVR = 3.1 WU Ao sat = 94% PA sat = 64%, 65%  Assessment: 1. Minimal non-obstructive CAD 2. NICM EF 30-35% 3. Moderate pulmonary HTN due primarily to OSA/OHS 4. Relative well-compensated volume status  Plan/Discussion:  Continue medical therapy. Will need aggressive management of OHS/OSA. Can go home tomorrow from our standpoint if otherwise stable.   Echo IMPRESSIONS    1. The left ventricle has severely reduced systolic function of 84-16%. The cavity size was normal. Left ventricular diastolic Doppler parameters are consistent with impaired relaxation Left ventrical global hypokinesis without obvious regional wall  motion abnormalities. The patient refused Definity contrast, the evaluation of wall motion is suboptimal.  2. The right ventricle has normal systolic function. The cavity was moderately enlarged. There is no increase in right ventricular wall thickness. Right ventricular systolic pressure normal with an estimated pressure of 28.6 mmHg.  3. Left atrial size was moderately dilated.  4. Right atrial size was severely dilated.  5. The mitral valve is normal in structure.  6. The tricuspid valve is normal in structure.  7. The aortic valve is normal in structure.  Antimicrobials:  Anti-infectives (From admission, onward)   Start     Dose/Rate Route Frequency Ordered Stop   07/26/18 1600  doxycycline (VIBRAMYCIN) 100 mg in sodium chloride 0.9 % 250 mL IVPB  Status:  Discontinued     100 mg 125 mL/hr over 120 Minutes Intravenous Every 12 hours 07/26/18 1436 08/02/18 1230   07/26/18 1430   azithromycin (ZITHROMAX) 500 mg in sodium chloride 0.9 % 250 mL IVPB  Status:  Discontinued     500 mg 250 mL/hr over 60 Minutes Intravenous Every 24 hours 07/26/18 1426 07/28/18 1002     Subjective: Feels Makynli Stills little better than yesterday. Breathing is ok.  Objective: Vitals:   08/06/18 0816 08/06/18 0818 08/06/18 0858 08/06/18 1439  BP: 133/70     Pulse: (!) 106 (!) 106    Resp: (!) 23     Temp:      TempSrc:      SpO2: 94%  95% 96%  Weight:      Height:        Intake/Output Summary (Last 24 hours) at 08/06/2018 1628 Last data filed at 08/06/2018 1300 Gross per 24 hour  Intake 480 ml  Output 2325 ml  Net -1845 ml   Filed Weights   08/04/18 0510 08/05/18 0513 08/06/18 0515  Weight: 105.8 kg 106.8 kg 106.2 kg    Examination:  General exam: Appears calm and comfortable, sitting up in chair, obese Respiratory system: some faint expiratory wheezes Cardiovascular system: S1 & S2 heard, RRR.  Gastrointestinal system: Abdomen is protuberant, soft and nontender.  Central nervous system: Alert and oriented. No focal neurological deficits. Extremities: trace LEE Skin: No rashes, lesions or ulcers Psychiatry: Judgement and insight appear normal. Mood & affect appropriate.  Data Reviewed: I have personally reviewed following labs and imaging studies  CBC: Recent Labs  Lab 08/02/18 0620 08/03/18 0623 08/04/18 0555 08/04/18 1121 08/04/18 1126 08/05/18 0455 08/06/18 0413  WBC 23.5* 19.6* 17.4*  --   --  17.7* 15.4*  NEUTROABS 20.4* 16.6* 14.1*  --   --  15.1* 12.8*  HGB 12.9* 12.9* 12.9* 13.6 13.9  13.6 12.0* 12.4*  HCT 45.4 46.1 45.1 40.0 41.0  40.0 43.1 44.7  MCV 91.3 90.4 91.3  --   --  92.9 91.4  PLT 264 263 267  --   --  267 629   Basic Metabolic Panel: Recent Labs  Lab 08/02/18 0600 08/03/18 0623 08/04/18 0555 08/04/18 1121 08/04/18 1126 08/05/18 0455 08/06/18 0413  NA 147* 144 142 144 144  146* 142 140  K 3.3* 3.9 3.8 3.7 3.9  3.6 3.7 3.8  CL  105 104 100  --   --  101 96*  CO2 32 29 32  --   --  33* 35*  GLUCOSE 108* 125* 110*  --   --  99 108*  BUN 49* 32* 17  --   --  10 16  CREATININE 1.04 1.01 0.91  --   --  0.74 0.84  CALCIUM 9.4 9.4 9.2  --   --  8.7* 8.9  MG 2.3 2.1 2.1  --   --  2.2 2.1  PHOS 3.6 3.7 3.7  --   --  3.1 3.0   GFR: Estimated Creatinine Clearance: 97.5 mL/min (by C-G formula based on SCr of 0.84 mg/dL). Liver Function Tests: No results for input(s): AST, ALT, ALKPHOS, BILITOT, PROT, ALBUMIN in the last 168 hours. No results for input(s): LIPASE, AMYLASE in the last 168 hours. No results for input(s): AMMONIA in the last 168 hours. Coagulation Profile: No results for input(s): INR, PROTIME in the last 168 hours. Cardiac Enzymes: No results for input(s): CKTOTAL, CKMB, CKMBINDEX, TROPONINI in the last 168 hours. BNP (last 3 results) No results for input(s): PROBNP in the last 8760 hours. HbA1C: No results for input(s): HGBA1C in the last 72 hours. CBG: Recent Labs  Lab 08/02/18 1119 08/02/18 1616 08/02/18 2102 08/03/18 0748 08/03/18 2236  GLUCAP 98 108* 126* 135* 110*   Lipid Profile: No results for input(s): CHOL, HDL, LDLCALC, TRIG, CHOLHDL, LDLDIRECT in the last 72 hours. Thyroid Function Tests: No results for input(s): TSH, T4TOTAL, FREET4, T3FREE, THYROIDAB in the last 72 hours. Anemia Panel: No results for input(s): VITAMINB12, FOLATE, FERRITIN, TIBC, IRON, RETICCTPCT in the last 72 hours. Sepsis Labs: No results for input(s): PROCALCITON, LATICACIDVEN in the last 168 hours.  No results found for this or any previous visit (from the past 240 hour(s)).       Radiology Studies: No results found.      Scheduled Meds: . aspirin  81 mg Oral Daily  . atorvastatin  40 mg Oral q1800  . carvedilol  3.125 mg Oral BID WC  . chlorhexidine gluconate (MEDLINE KIT)  15 mL Mouth Rinse BID  . furosemide  60 mg Oral Daily  . heparin  5,000 Units Subcutaneous Q8H  . ipratropium  0.5 mg  Nebulization TID  . levalbuterol  0.63 mg Nebulization TID  . mouth rinse  15 mL Mouth Rinse BID  . pantoprazole  40 mg Oral Daily  . polyethylene glycol  17 g Oral Daily  . ENSURE MAX PROTEIN  11 oz Oral BID  . sacubitril-valsartan  1 tablet Oral BID  .  sennosides  5 mL Oral QHS  . sodium chloride flush  3 mL Intravenous Q12H  . sodium chloride flush  3 mL Intravenous Q12H  . spironolactone  12.5 mg Oral QHS   Continuous Infusions: . sodium chloride    . sodium chloride       LOS: 16 days    Time spent: over 30 min    Fayrene Helper, MD [facility] Pager AMION  If 7PM-7AM, please contact night-coverage [email or web address] Password [facility] 08/06/2018, 4:28 PM

## 2018-08-06 NOTE — Progress Notes (Signed)
CRITICAL VALUE ALERT  Critical Value:  CO2 77.9  Date & Time Notied:  1843  Provider Notified: Lowell Guitar, MD  Orders Received/Actions taken: Put pt on Bipap now, called RT and notified. RT on the way

## 2018-08-06 NOTE — Social Work (Signed)
[employer] auth for SNF is still pending.  Abigail Butts, LCSW [phone]

## 2018-08-06 NOTE — Progress Notes (Signed)
Pt was lethargic, did not follow commands, called RRT and MD. MD came up to assess pt, ordered ABG.  Pt got back to his normal self after a short period of time. Pt stated he could hear me but couldn't move his hand/ head.  ABG resulted back with CO2 77.9. Notified MD

## 2018-08-06 NOTE — Treatment Plan (Addendum)
Called to bedside by nurse. Pt with episode of unresponsiveness.  Pt getting ready to take meds and was alert.  Then, when nurse went to give pt meds, was unresponsive. RR nurse evaluated and pt improved with increased stimulation. Suspect hypercarbia.  He hasn't used bipap at night in Kristopher [name] few days.   ABG ordered. Discuss weaning O2 for goal 90-92%.   NAD, Itzia Cunliffe&O, RRR, lung sounds distant due to habitus, but no increased WOB.  Moving all extremities and following commands.  He feels back to normal.  ABG with respiratory acidosis. Start on bipap.

## 2018-08-06 NOTE — Significant Event (Signed)
Rapid Response Event Note  Overview:  Called for decreased LOC Time Called: 1815 Arrival Time: 1818 Event Type: Neurologic  Initial Focused Assessment:  Supine in bed - warm and dry - opened eyes to DPS - moves all extremities to pain - after few minutes responded to name and DPS - stated his name - denies pain - SOB - follows commands - names objects - no focal deficits noted.  States he heard [place] but could not respond.  BP 157/95 HR 103 RR 20 O2 sats 98% on 6 liters nasal cannula.  CBG 112. Staff reports refusal to wear Bipap for last few nights - hx OSA with CO2 retention.    Interventions: Stat ABG per order Dr. Lowell Guitar. Weaned O2 down - with hx no need for O2 sats 100% - discussed with Dr. Lowell Guitar who is at the bedside - will maintain sats 90 - 92%.  Handoff to [employer].     Plan of Care (if not transferred):  Per MD orders - encourage Bipap tonight.    Event Summary: Name of Physician Notified: Dr. Lowell Guitar at (pta RRT)    at    Outcome: Stayed in room and stabalized  Event End Time: 0929  [place]

## 2018-08-06 NOTE — Plan of Care (Signed)
°  Problem: Education: °Goal: Ability to demonstrate management of disease process will improve °Outcome: Progressing °  °Problem: Education: °Goal: Ability to verbalize understanding of medication therapies will improve °Outcome: Progressing °  °Problem: Activity: °Goal: Capacity to carry out activities will improve °Outcome: Progressing °  °

## 2018-08-06 NOTE — Progress Notes (Signed)
CARDIAC REHAB PHASE I   PRE:  Rate/Rhythm: 98 SR  BP:  Supine:   Sitting: 116/71  Standing:    SaO2: 95% 5L  MODE:  Ambulation: 100 ft   POST:  Rate/Rhythm: 110 ST  BP:  Supine:   Sitting: 130/69  Standing:    SaO2: 100% 5L 1445-1509 Pt willing to walk if we stayed in room. He was afraid to go out in hall as he stated he felt a little weak. Pt able to walk from recliner to door three times on 5L with walker. Tired easily but motivated to walk. Left in recliner with call bell. Walked about 100 ft with asst x 2.   Luetta Nutting, RN BSN  08/06/2018 3:04 PM

## 2018-08-07 LAB — BASIC METABOLIC PANEL
Anion gap: 11 (ref 5–15)
BUN: 14 mg/dL (ref 8–23)
CO2: 32 mmol/L (ref 22–32)
Calcium: 8.6 mg/dL — ABNORMAL LOW (ref 8.9–10.3)
Chloride: 95 mmol/L — ABNORMAL LOW (ref 98–111)
Creatinine, Ser: 0.75 mg/dL (ref 0.61–1.24)
GFR calc Af Amer: >60 mL/min (ref >60)
GFR calc non Af Amer: >60 mL/min (ref >60)
GLUCOSE: 90 mg/dL (ref 70–99)
Potassium: 4.4 mmol/L (ref 3.5–5.1)
Sodium: 138 mmol/L (ref 135–145)

## 2018-08-07 LAB — CBC
HCT: 43.2 % (ref 39.0–52.0)
Hemoglobin: 12.3 g/dL — ABNORMAL LOW (ref 13.0–17.0)
MCH: 25.8 pg — ABNORMAL LOW (ref 26.0–34.0)
MCHC: 28.5 g/dL — ABNORMAL LOW (ref 30.0–36.0)
MCV: 90.8 fL (ref 80.0–100.0)
PLATELETS: 326 10*3/uL (ref 150–400)
RBC: 4.76 MIL/uL (ref 4.22–5.81)
RDW: 17.1 % — ABNORMAL HIGH (ref 11.5–15.5)
WBC: 15.2 10*3/uL — ABNORMAL HIGH (ref 4.0–10.5)
nRBC: 0 % (ref 0.0–0.2)

## 2018-08-07 LAB — BLOOD GAS, VENOUS
Acid-Base Excess: 13.4 mmol/L — ABNORMAL HIGH (ref 0.0–2.0)
Bicarbonate: 39.6 mmol/L — ABNORMAL HIGH (ref 20.0–28.0)
O2 Saturation: 90.7 %
PATIENT TEMPERATURE: 98.6
pCO2, Ven: 75.8 mmHg (ref 44.0–60.0)
pH, Ven: 7.338 (ref 7.250–7.430)
pO2, Ven: 63.1 mmHg — ABNORMAL HIGH (ref 32.0–45.0)

## 2018-08-07 LAB — MAGNESIUM: Magnesium: 2 mg/dL (ref 1.7–2.4)

## 2018-08-07 LAB — PHOSPHORUS: Phosphorus: 2.8 mg/dL (ref 2.5–4.6)

## 2018-08-07 MED ORDER — ENSURE MAX PROTEIN PO LIQD
11.0000 [oz_av] | Freq: Every day | ORAL | Status: DC
  Administered 2018-08-08: 11 [oz_av] via ORAL
  Filled 2018-08-07: qty 330

## 2018-08-07 MED ORDER — IPRATROPIUM BROMIDE 0.02 % IN SOLN
0.5000 mg | Freq: Two times a day (BID) | RESPIRATORY_TRACT | Status: DC
  Administered 2018-08-07 – 2018-08-08 (×2): 0.5 mg via RESPIRATORY_TRACT
  Filled 2018-08-07 (×2): qty 2.5

## 2018-08-07 MED ORDER — LEVALBUTEROL HCL 1.25 MG/0.5ML IN NEBU
0.6300 mg | INHALATION_SOLUTION | Freq: Two times a day (BID) | RESPIRATORY_TRACT | Status: DC
  Filled 2018-08-07 (×3): qty 0.5
  Filled 2018-08-07: qty 0.26

## 2018-08-07 MED ORDER — SENNOSIDES 8.8 MG/5ML PO SYRP
5.0000 mL | ORAL_SOLUTION | Freq: Every day | ORAL | Status: DC
  Filled 2018-08-07 (×2): qty 5

## 2018-08-07 NOTE — Social Work (Signed)
[facility] has [facility] approval for patient to admit to their facility. Discussed with MD, patient not yet medically ready. Patient's [employer] Berkley Harvey will still be valid tomorrow. Will follow for medical readiness and support with discharge planning.  Abigail Butts, LCSW [phone]

## 2018-08-07 NOTE — Progress Notes (Signed)
Physical Therapy Treatment Patient Details Name: Kristopher [name] MRN: [id number] DOB: 09-27-1950 Today's Date: 08/07/2018    History of Present Illness Pt adm with acute on chronic systolic/diastolic heart failure, acute hypoxic resp failure. Intubated 2/15, self extubated, re-intubated and extubated 2/19. PMH - GI bleed, pulmonary hypertension, asthma, HTN, DM    PT Comments    Patient seen for mobility progression with good progress towards goals. Patient ambulating with RW on 3L via [place] with SpO2 88% and greater throughout - 1 required standing rest break with hallway ambulation. Patient with subjective reports of having increased strength and endurance today. Will continue to follow and progress towards goals.     Follow Up Recommendations  SNF;Supervision/Assistance - 24 hour     Equipment Recommendations  Other (comment)(defer)    Recommendations for Other Services       Precautions / Restrictions Precautions Precautions: Fall Precaution Comments: watch O2 Restrictions Weight Bearing Restrictions: No    Mobility  Bed Mobility               General bed mobility comments: up in recliner  Transfers Overall transfer level: Needs assistance Equipment used: Rolling walker (2 wheeled) Transfers: Sit to/from [employer] Sit to Stand: Min guard Stand pivot transfers: Min guard       General transfer comment: min guard for safety - improved steadiness  Ambulation/Gait Ambulation/Gait assistance: Min assist;Min guard Gait Distance (Feet): 120 Feet(1 standing rest break) Assistive device: Rolling walker (2 wheeled) Gait Pattern/deviations: Step-through pattern;Decreased stride length;Wide base of support;Trunk flexed Gait velocity: decreased   General Gait Details: improved stability and safety with RW; Patient on 3L O2 throughout with good ability to maintain SpO2 88 and above. 1 standing rest break for breathing cues    Stairs              Wheelchair Mobility    Modified Rankin (Stroke Patients Only)       Balance Overall balance assessment: Needs assistance Sitting-balance support: No upper extremity supported;Feet supported Sitting balance-Leahy Scale: Good     Standing balance support: Bilateral upper extremity supported;During functional activity Standing balance-Leahy Scale: Fair                              Cognition Arousal/Alertness: Awake/alert Behavior During Therapy: WFL for tasks assessed/performed Overall Cognitive Status: Within Functional Limits for tasks assessed                                        Exercises      General Comments General comments (skin integrity, edema, etc.): very motivated and pleasant      Pertinent Vitals/Pain Pain Assessment: No/denies pain    Home Living                      Prior Function            PT Goals (current goals can now be found in the care plan section) Acute Rehab PT Goals Patient Stated Goal: Be independent PT Goal Formulation: With patient Time For Goal Achievement: 08/14/18 Potential to Achieve Goals: Good Progress towards PT goals: Progressing toward goals    Frequency    Min 2X/week      PT Plan Current plan remains appropriate    Co-evaluation  AM-PAC PT "6 Clicks" Mobility   Outcome Measure  Help needed turning from your back to your side while in a flat bed without using bedrails?: None Help needed moving from lying on your back to sitting on the side of a flat bed without using bedrails?: None Help needed moving to and from a bed to a chair (including a wheelchair)?: A Little Help needed standing up from a chair using your arms (e.g., wheelchair or bedside chair)?: A Little Help needed to walk in hospital room?: A Little Help needed climbing 3-5 steps with a railing? : A Lot 6 Click Score: 19    End of Session Equipment Utilized During Treatment:  Oxygen;Gait belt Activity Tolerance: Patient tolerated treatment well Patient left: in chair;with call bell/phone within reach;with chair alarm set Nurse Communication: Mobility status PT Visit Diagnosis: Unsteadiness on feet (R26.81);Other abnormalities of gait and mobility (R26.89);Muscle weakness (generalized) (M62.81)     Time: 7628-3151 PT Time Calculation (min) (ACUTE ONLY): 17 min  Charges:  $Gait Training: 8-22 mins                     Kipp Laurence, PT, DPT Supplemental Physical Therapist 08/07/18 11:23 AM Pager: [phone] Office: [phone]

## 2018-08-07 NOTE — Progress Notes (Signed)
PROGRESS NOTE    Kristopher [name]  PPJ:[id number] DOB: 06-06-1951 DOA: 07/21/2018 PCP: Maurice Small, MD   Brief Narrative:  68 year old male, PMH of dilated cardiomyopathy, chronic combined systolic and diastolic CHF, pulmonary hypertension, CAD, HTN, LBBB, asthma/COPD, esophagitis/PUD/upper GI bleed, prediabetes, presented to ED on 07/21/2018 due to progressive weight gain and dyspnea.  He was initially admitted to the floor for decompensated CHF.  On 2/14, he decompensated with hypercapnia and acute encephalopathy, transferred to stepdown and started on BiPAP but had to be transferred to ICU for intubation and mechanical ventilation.  CCM took over care.  Advanced HF team consulted.  Extubated 2/19.  Improved and stabilized and transferred to progressive care unit under [facility] care on 2/22.  Cardiology continue to see and performed left and right heart cath 2/24.  Slowly improving.  Persistent hypoxia.   Assessment & Plan:   Principal Problem:   Acute on chronic combined systolic and diastolic congestive heart failure, NYHA class 4 (HCC) Active Problems:   DOE (dyspnea on exertion)   Chronic combined systolic and diastolic heart failure (HCC)   HTN (hypertension)   DCM (dilated cardiomyopathy) (HCC)   PUD (peptic ulcer disease)   Pulmonary HTN ([place])   1. Acute on chronic combined systolic and diastolic CHF: Advanced HF team consulted and assisted with care.  TTE 07/22/2018: LVEF 25-30%.   1. Treated early on in admission with IV Lasix, Diamox and metolazone.   2. - 11.8 L since admission.   3. Currently on Lasix 60 mg daily, Aldactone 12.5 mg, entresto 24/26 BID, coreg 2.125 BID  4. As per cardiology, due to new low EF, underwent right and left heart cath on 2/24, report as below, minimal nonobstructive CAD, NICM EF 30-35%, moderate pulmonary hypertension due primarily due to OSA/OHS and well compensated volume status.  5.  PT and OT recommend SNF.  6. Continue daily labs, alkalosis improved  today, likely compensatory from respiratory acidosis yesterday  2. Acute on chronic hypoxic and hypercapnic respiratory failure: Likely multifactorial, COPD exacerbation, decompensated CHF, questionable pneumonia over probable OSA/OHS.  Extubated 2/19.  Aggressive pulmonary toilet.  Has been on nightly BiPAP for the last couple of days.  Recently has c/o hoarseness.  Minimize opioids.  Tracheal aspirate culture showed diphtheroids.  Has completed 7 days course of antibiotics (azithromycin/doxycycline)-discontinued.  Needs outpatient sleep study, BiPAP at discharge.  Case management consulted to see if he qualifies for Trelegy at SNF.  Persistently hypoxic on room air at rest and requires 3 L/min oxygen to come up to 88%. 1. Last night had episode of AMS and was hypercarbic.  He's improved today after bipap last night.  Encouraged use of bipap.  Nursing to discuss with RT.   2. Goal saturation 88-92%  3. Suspected OSA/OHS: Will need outpatient sleep study after discharge.  4. Acute on stage III chronic kidney disease: Last known baseline creatinine in May 2018 was in 1.1-1.2 range.  Presented with creatinine of 1.5.  This peaked to 2.70 this admission.  Creatinine has normalized and remains appropriate.  Follow BMP closely.  5. Acute metabolic encephalopathy: Resolved.  Had episode of confusion last night.  Improved today.  Secondary to hypercarbia.  6. Prediabetes: A1c 6.  CBGs well controlled in the hospital and not requiring much insulins.  DC CBG checks and SSI.  Outpatient follow-up.  7. Essential hypertension: Controlled.  Continue spiro, lasix, entresto, coreg as noted above.  8. History of PUD/esophagitis/upper GI bleed: Continue PPI.  9. Deconditioning: PT  and OT recommend SNF pending clinical improvement.  Patient agreeable to SNF and clinical social work working on it.  10. Pulmonary hypertension: Moderate pulmonary hypertension by heart cath 2/24.  11. Constipation: Aggressive bowel  regimen.  Last BM 2/ 24  12. Leukocytosis: May be due to recent steroids.  No overt infectious etiology.  Continues to improve.  Follow CBCs.  13. Hypernatremia: Related to recent aggressive diuresis.  Resolved.  14. Hypokalemia: Replaced.  Magnesium normal.  DVT prophylaxis: heparin Code Status:full  Family Communication: none at bedside Disposition Plan: pending SNF.  Continue inpatient an additional 24 hours due to acute encephalopathy last night, ensure he continues to do well.   Consultants:   Cardiology  pccm  Procedures:  Cath  Prox LAD lesion is 30% stenosed.  Prox RCA to Mid RCA lesion is 30% stenosed.  Prox Cx lesion is 30% stenosed.   Findings:  Ao = 107/69 (87)  LV =  119/13 RA =  5 RV = 64/11 PA = 71/23 (39) PCW = 21 Fick cardiac output/index = 5.6/2.6 PVR = 3.1 WU Ao sat = 94% PA sat = 64%, 65%  Assessment: 1. Minimal non-obstructive CAD 2. NICM EF 30-35% 3. Moderate pulmonary HTN due primarily to OSA/OHS 4. Relative well-compensated volume status  Plan/Discussion:  Continue medical therapy. Will need aggressive management of OHS/OSA. Can go home tomorrow from our standpoint if otherwise stable.   Echo IMPRESSIONS    1. The left ventricle has severely reduced systolic function of 23-53%. The cavity size was normal. Left ventricular diastolic Doppler parameters are consistent with impaired relaxation Left ventrical global hypokinesis without obvious regional wall  motion abnormalities. The patient refused Definity contrast, the evaluation of wall motion is suboptimal.  2. The right ventricle has normal systolic function. The cavity was moderately enlarged. There is no increase in right ventricular wall thickness. Right ventricular systolic pressure normal with an estimated pressure of 28.6 mmHg.  3. Left atrial size was moderately dilated.  4. Right atrial size was severely dilated.  5. The mitral valve is normal in structure.  6. The  tricuspid valve is normal in structure.  7. The aortic valve is normal in structure.  Antimicrobials:  Anti-infectives (From admission, onward)   Start     Dose/Rate Route Frequency Ordered Stop   07/26/18 1600  doxycycline (VIBRAMYCIN) 100 mg in sodium chloride 0.9 % 250 mL IVPB  Status:  Discontinued     100 mg 125 mL/hr over 120 Minutes Intravenous Every 12 hours 07/26/18 1436 08/02/18 1230   07/26/18 1430  azithromycin (ZITHROMAX) 500 mg in sodium chloride 0.9 % 250 mL IVPB  Status:  Discontinued     500 mg 250 mL/hr over 60 Minutes Intravenous Every 24 hours 07/26/18 1426 07/28/18 1002     Subjective: No complaints today. Discussed at length importance of bipap.  Objective: Vitals:   08/07/18 0812 08/07/18 0900 08/07/18 1110 08/07/18 1524  BP:   94/67 110/69  Pulse:  92 96 95  Resp:      Temp:  98.6 F (37 C) 98.4 F (36.9 C)   TempSrc:  Oral Oral   SpO2: 94%  97% 96%  Weight:      Height:        Intake/Output Summary (Last 24 hours) at 08/07/2018 1603 Last data filed at 08/07/2018 1300 Gross per 24 hour  Intake 480 ml  Output 400 ml  Net 80 ml   Filed Weights   08/04/18 0510 08/05/18 0513 08/06/18 **Note De-Identified vi Obfusction** 0515  Weight: 105.8 kg 106.8 kg 106.2 kg    Exmintion:  Generl: No cute distress. Crdiovsculr: Hert sounds show  regulr rte, nd rhythm. Lungs: Cler to usculttion bilterlly  Abdomen: Soft, nontender, protubernt Neurologicl: Alert nd oriented 3. Moves ll extremities 4. Crnil nerves II through XII grossly intct. Skin: Wrm nd dry. No rshes or lesions. Extremities: No clubbing or cynosis. No edem.  Psychitric: Mood nd ffect re norml. Insight nd judgment re pproprite.    Dt Reviewed: I hve personlly reviewed following lbs nd imging studies  CBC: Recent Lbs  Lb 08/02/18 0620 08/03/18 0623 08/04/18 0555 08/04/18 1121 08/04/18 1126 08/05/18 0455 08/06/18 0413 08/07/18 0736  WBC 23.5* 19.6* 17.4*  --   --   17.7* 15.4* 15.2*  NEUTROABS 20.4* 16.6* 14.1*  --   --  15.1* 12.8*  --   HGB 12.9* 12.9* 12.9* 13.6 13.9  13.6 12.0* 12.4* 12.3*  HCT 45.4 46.1 45.1 40.0 41.0  40.0 43.1 44.7 43.2  MCV 91.3 90.4 91.3  --   --  92.9 91.4 90.8  PLT 264 263 267  --   --  267 300 176   Bsic Metbolic Pnel: Recent Lbs  Lb 08/03/18 0623 08/04/18 0555 08/04/18 1121 08/04/18 1126 08/05/18 0455 08/06/18 0413 08/07/18 0336  NA 144 142 144 144  146* 142 140 138  K 3.9 3.8 3.7 3.9  3.6 3.7 3.8 4.4  CL 104 100  --   --  101 96* 95*  CO2 29 32  --   --  33* 35* 32  GLUCOSE 125* 110*  --   --  99 108* 90  BUN 32* 17  --   --  _0 CREATININE 1.01 0.91  --   --  0.74 0.84 0.75  CALCIUM 9.4 9.2  --   --  8.7* 8.9 8.6*  MG 2.1 2.1  --   --  2.2 2.1 2.0  PHOS 3.7 3.7  --   --  3.1 3.0 2.8   GFR: Estimted Cretinine Clernce: 102.4 mL/min (by [employer] formul bsed on SCr of 0.75 mg/dL). Liver Function Tests: No results for input(s): AST, ALT, ALKPHOS, BILITOT, PROT, ALBUMIN in the lst 168 hours. No results for input(s): LIPASE, AMYLASE in the lst 168 hours. No results for input(s): AMMONIA in the lst 168 hours. Cogultion Profile: No results for input(s): INR, PROTIME in the lst 168 hours. Crdic Enzymes: No results for input(s): CKTOTAL, CKMB, CKMBINDEX, TROPONINI in the lst 168 hours. BNP (lst 3 results) No results for input(s): PROBNP in the lst 8760 hours. HbA1C: No results for input(s): HGBA1C in the lst 72 hours. CBG: Recent Lbs  Lb 08/02/18 1616 08/02/18 2102 08/03/18 0748 08/03/18 2236 08/06/18 1758  GLUCAP 108* 126* 135* 110* 112*   Lipid Profile: No results for input(s): CHOL, HDL, LDLCALC, TRIG, CHOLHDL, LDLDIRECT in the lst 72 hours. Thyroid Function Tests: No results for input(s): TSH, T4TOTAL, FREET4, T3FREE, THYROIDAB in the lst 72 hours. Anemi Pnel: No results for input(s): VITAMINB12, FOLATE, FERRITIN, TIBC, IRON, RETICCTPCT in the lst 72  hours. Sepsis Lbs: No results for input(s): PROCALCITON, LATICACIDVEN in the lst 168 hours.  No results found for this or ny previous visit (from the pst 240 hour(s)).       Rdiology Studies: No results found.      Scheduled Meds: . spirin  81 mg Orl Dily  . torvsttin  40 mg Orl q1800  . crvedilol  3.125 mg  Oral BID WC  . chlorhexidine gluconate (MEDLINE KIT)  15 mL Mouth Rinse BID  . furosemide  60 mg Oral Daily  . heparin  5,000 Units Subcutaneous Q8H  . ipratropium  0.5 mg Nebulization BID  . levalbuterol  0.63 mg Nebulization BID  . mouth rinse  15 mL Mouth Rinse BID  . pantoprazole  40 mg Oral Daily  . polyethylene glycol  17 g Oral Daily  . [START ON 08/08/2018] ENSURE MAX PROTEIN  11 oz Oral Daily  . sacubitril-valsartan  1 tablet Oral BID  . sennosides  5 mL Oral Daily  . sodium chloride flush  3 mL Intravenous Q12H  . sodium chloride flush  3 mL Intravenous Q12H  . spironolactone  12.5 mg Oral QHS   Continuous Infusions: . sodium chloride    . sodium chloride       LOS: 17 days    Time spent: over 30 min    Fayrene Helper, MD [facility] Pager AMION  If 7PM-7AM, please contact night-coverage [email or web address] Password [facility] 08/07/2018, 4:03 PM

## 2018-08-07 NOTE — Progress Notes (Signed)
Occupational Therapy Treatment Patient Details Name: Kristopher [name] MRN: [id number] DOB: February 13, 1951 Today's Date: 08/07/2018    History of present illness Pt adm with acute on chronic systolic/diastolic heart failure, acute hypoxic resp failure. Intubated 2/15, self extubated, re-intubated and extubated 2/19. PMH - GI bleed, pulmonary hypertension, asthma, HTN, DM   OT comments  Pt performing grooming at sink in standing, stooping on elbows at times with set-upA. Pt's SpO2 levels >93% on 3L O2 with exertion as pt ambulating around room. Pt with SOB noted.  Energy conservation techniques read aloud and pt able to state 1 technique to sit down for tasks. Pt continues to progress with mobility and transfers with minguardA and use of RW. Pt continues to progress and requires continued OT skilled services for energy conservation techniques. OT to follow acutely.    Follow Up Recommendations  SNF;Supervision/Assistance - 24 hour    Equipment Recommendations  None recommended by OT    Recommendations for Other Services      Precautions / Restrictions Precautions Precautions: Fall Precaution Comments: watch O2 Restrictions Weight Bearing Restrictions: No       Mobility Bed Mobility               General bed mobility comments: up in recliner  Transfers Overall transfer level: Needs assistance Equipment used: Rolling walker (2 wheeled) Transfers: Sit to/from [employer] Sit to Stand: Min guard Stand pivot transfers: Min guard            Balance Overall balance assessment: Needs assistance   Sitting balance-Leahy Scale: Good       Standing balance-Leahy Scale: Fair                             ADL either performed or assessed with clinical judgement   ADL Overall ADL's : Needs assistance/impaired     Grooming: Supervision/safety;Standing                               Functional mobility during ADLs: Rolling walker;Min  guard General ADL Comments: Requiring assist for ADL as pt dyspenia with any exertion opn 3L o2 >90%      Vision       Perception     Praxis      Cognition Arousal/Alertness: Awake/alert Behavior During Therapy: WFL for tasks assessed/performed Overall Cognitive Status: Within Functional Limits for tasks assessed                                          Exercises     Shoulder Instructions       General Comments      Pertinent Vitals/ Pain       Pain Assessment: No/denies pain  Home Living                                          Prior Functioning/Environment              Frequency  Min 2X/week        Progress Toward Goals  OT Goals(current goals can now be found in the care plan section)  Progress towards OT goals: Progressing toward goals  Acute Rehab OT Goals  Patient Stated Goal: Be independent OT Goal Formulation: With patient Time For Goal Achievement: 08/14/18 Potential to Achieve Goals: Good ADL Goals Pt Will Perform Grooming: with modified independence;standing Pt Will Perform Lower Body Bathing: Independently;sit to/from stand Pt Will Perform Lower Body Dressing: Independently;sit to/from stand Pt Will Transfer to Toilet: with modified independence;ambulating;regular height toilet Pt Will Perform Toileting - Clothing Manipulation and hygiene: with modified independence Additional ADL Goal #1: Pt will utilize energy conservation strategies and breathing techniques during exertion with min cues.  Plan Discharge plan remains appropriate    Co-evaluation                 AM-PAC OT "6 Clicks" Daily Activity     Outcome Measure   Help from another person eating meals?: None Help from another person taking care of personal grooming?: A Little Help from another person toileting, which includes using toliet, bedpan, or urinal?: A Little Help from another person bathing (including washing, rinsing,  drying)?: A Little Help from another person to put on and taking off regular upper body clothing?: A Little Help from another person to put on and taking off regular lower body clothing?: A Little 6 Click Score: 19    End of Session Equipment Utilized During Treatment: Gait belt;Rolling walker  OT Visit Diagnosis: Unsteadiness on feet (R26.81);Muscle weakness (generalized) (M62.81)   Activity Tolerance Patient tolerated treatment well   Patient Left in chair;with call bell/phone within reach;with chair alarm set   Nurse Communication Mobility status        Time: 1610-9604 OT Time Calculation (min): 15 min  Charges: OT General Charges $OT Visit: 1 Visit OT Treatments $Self Care/Home Management : 8-22 mins  Cristi Loron) Glendell Docker OTR/L Acute Rehabilitation Services Pager: [phone] Office: [phone]   Sandrea Hughs 08/07/2018, 3:36 PM

## 2018-08-07 NOTE — Progress Notes (Signed)
Nutrition Follow-up  DOCUMENTATION CODES:   Obesity unspecified  INTERVENTION:    Continue Ensure Max po once daily in the morning per patient request, each supplement provides 150 kcal and 30 grams of protein.   NUTRITION DIAGNOSIS:   Inadequate oral intake related to inability to eat as evidenced by NPO status.  Resolved  GOAL:   Patient will meet greater than or equal to 90% of their needs  Met with intake of meals and supplements  MONITOR:   PO intake, Supplement acceptance, I & O's, Labs, Weight trends   ASSESSMENT:   68 year old male who presented to the ED on 2/10 with SOB and leg swelling.  PMH significant for CHF, HTN, T2DM, CAD. Pt admitted with CHF exacerbation.  Diet has been advanced to heart healthy with thin liquids. Patient is consuming 100% of meals. He is also drinking Ensure Enlive once or twice daily. He prefers to have the Ensure in the morning because he doesn't always feel hungry for breakfast. He has a good appetite and is eating better than he does at home.   Labs reviewed. Medications reviewed and include lasix, miralax.   Patient had an episode of unresponsiveness yesterday and required BiPAP. Currently on 3 L nasal cannula. Plans for d/c to SNF when medically ready.  Diet Order:   Diet Order            Diet Heart Room service appropriate? Yes; Fluid consistency: Thin  Diet effective now              EDUCATION NEEDS:   No education needs have been identified at this time  Skin:  Skin Assessment: Reviewed RN Assessment  Last BM:  2/24  Height:   Ht Readings from Last 1 Encounters:  08/06/18 _0  (1.676 m)    Weight:   Wt Readings from Last 1 Encounters:  08/06/18 106.2 kg    Ideal Body Weight:  64.5 kg  BMI:  Body mass index is 37.78 kg/m.  Estimated Nutritional Needs:   Kcal:  2100-2300  Protein:  105-120 grams  Fluid:  per MD    Molli Barrows, RD, LDN, [place] Pager [phone] After Hours Pager  [phone]

## 2018-08-08 LAB — CBC
HCT: 42.7 % (ref 39.0–52.0)
Hemoglobin: 12.1 g/dL — ABNORMAL LOW (ref 13.0–17.0)
MCH: 25.7 pg — ABNORMAL LOW (ref 26.0–34.0)
MCHC: 28.3 g/dL — ABNORMAL LOW (ref 30.0–36.0)
MCV: 90.7 fL (ref 80.0–100.0)
Platelets: 341 10*3/uL (ref 150–400)
RBC: 4.71 MIL/uL (ref 4.22–5.81)
RDW: 16.8 % — AB (ref 11.5–15.5)
WBC: 13.1 10*3/uL — ABNORMAL HIGH (ref 4.0–10.5)
nRBC: 0 % (ref 0.0–0.2)

## 2018-08-08 LAB — BASIC METABOLIC PANEL
Anion gap: 8 (ref 5–15)
BUN: 13 mg/dL (ref 8–23)
CO2: 38 mmol/L — ABNORMAL HIGH (ref 22–32)
Calcium: 9 mg/dL (ref 8.9–10.3)
Chloride: 94 mmol/L — ABNORMAL LOW (ref 98–111)
Creatinine, Ser: 0.84 mg/dL (ref 0.61–1.24)
GFR calc Af Amer: >60 mL/min (ref >60)
GFR calc non Af Amer: >60 mL/min (ref >60)
Glucose, Bld: 96 mg/dL (ref 70–99)
Potassium: 4 mmol/L (ref 3.5–5.1)
Sodium: 140 mmol/L (ref 135–145)

## 2018-08-08 LAB — MAGNESIUM: Magnesium: 2 mg/dL (ref 1.7–2.4)

## 2018-08-08 LAB — PHOSPHORUS: Phosphorus: 2.4 mg/dL — ABNORMAL LOW (ref 2.5–4.6)

## 2018-08-08 MED ORDER — POTASSIUM CHLORIDE ER 10 MEQ PO TBCR: 20.0000 meq | EXTENDED_RELEASE_TABLET | Freq: Every day | ORAL | 0 refills | Status: DC

## 2018-08-08 MED ORDER — IPRATROPIUM BROMIDE 0.02 % IN SOLN: 0.5000 mg | Freq: Two times a day (BID) | RESPIRATORY_TRACT | 12 refills | Status: DC

## 2018-08-08 MED ORDER — FUROSEMIDE 20 MG PO TABS: 60.0000 mg | ORAL_TABLET | Freq: Every day | ORAL | 0 refills | Status: DC

## 2018-08-08 MED ORDER — SACUBITRIL-VALSARTAN 24-26 MG PO TABS: 1.0000 | ORAL_TABLET | Freq: Two times a day (BID) | ORAL | 0 refills | Status: AC

## 2018-08-08 MED ORDER — CARVEDILOL 3.125 MG PO TABS: 3.1250 mg | ORAL_TABLET | Freq: Two times a day (BID) | ORAL | 0 refills | Status: DC

## 2018-08-08 MED ORDER — ASPIRIN 81 MG PO CHEW: 81.0000 mg | CHEWABLE_TABLET | Freq: Every day | ORAL | 0 refills | Status: AC

## 2018-08-08 MED ORDER — ATORVASTATIN CALCIUM 40 MG PO TABS: 40.0000 mg | ORAL_TABLET | Freq: Every day | ORAL | 0 refills | Status: DC

## 2018-08-08 NOTE — Progress Notes (Signed)
Patient discharged in stable condition with PTAR to [facility]. IV removed intact. AVS discussed with patient and questions answered. Copy of AVS given to PTAR for facility. Report called to nurse at facility.

## 2018-08-08 NOTE — Social Work (Signed)
Patient will discharge to [facility] Anticipated discharge date: 08/08/18 Family notified: Norwood Levo, daughter Transportation by: PTAR  Nurse to call report to [phone].  CSW signing off.  Abigail Butts, LCSWA  Clinical Social Worker

## 2018-08-08 NOTE — Research (Signed)
PHDEV Informed Consent -Late Entry                 Subject Name:   Kristopher [name]   Subject met inclusion and exclusion criteria.  The informed consent form, study requirements and expectations were reviewed with the subject and questions and concerns were addressed prior to the signing of the consent form.  The subject verbalized understanding of the trial requirements.  The subject agreed to participate in the [facility] trial and signed the informed consent.  The informed consent was obtained prior to performance of any protocol-specific procedures for the subject.  A copy of the signed informed consent was given to the subject and a copy was placed in the subject's medical record. This patient was consented by Waynetta Sandy on 07-24-18 at 10:10 a.m.   [place] Shakiah Wester, Research Assistant 07/24/18 10:10 a.m.

## 2018-08-08 NOTE — Clinical Social Work Placement (Signed)
   CLINICAL SOCIAL WORK PLACEMENT  NOTE  Date:  08/08/2018  Patient Details  Name: Kristopher [name] MRN: [id number] Date of Birth: [birth year]  Clinical Social Work is seeking post-discharge placement for this patient at the Skilled  Nursing Facility level of care (*CSW will initial, date and re-position this form in  chart as items are completed):  Yes   Patient/family provided with [place] Clinical Social Work Department's list of facilities offering this level of care within the geographic area requested by the patient (or if unable, by the patient's family).  Yes   Patient/family informed of their freedom to choose among providers that offer the needed level of care, that participate in Medicare, Medicaid or managed care program needed by the patient, have an available bed and are willing to accept the patient.  Yes   Patient/family informed of [facility]'s ownership interest in [facility] and [facility], as well as of the fact that they are under no obligation to receive care at these facilities.  PASRR submitted to EDS on 08/03/18     PASRR number received on 08/03/18     Existing PASRR number confirmed on       FL2 transmitted to all facilities in geographic area requested by pt/family on 08/03/18     FL2 transmitted to all facilities within larger geographic area on       Patient informed that his/her managed care company has contracts with or will negotiate with certain facilities, including the following:  [facility]     Yes   Patient/family informed of bed offers received.  Patient chooses bed at [facility]     Physician recommends and patient chooses bed at      Patient to be transferred to [facility] on 08/08/18.  Patient to be transferred to facility by PTAR     Patient family notified on 08/08/18 of transfer.  Name of family member notified:  Kristopher [name], daughter     PHYSICIAN       Additional Comment:     _______________________________________________ Abigail Butts, LCSW 08/08/2018, 11:39 AM

## 2018-08-08 NOTE — Discharge Summary (Addendum)
Physician Discharge Summary  Kristopher LeepJohn H Ulrey WUJ:[id number] DOB: 04/20/1951 DOA: 07/21/2018  PCP: Shirlean MylarWebb, Carol, MD  Admit date: 07/21/2018 Discharge date: 08/08/2018  Time spent: 40 minutes  Recommendations for Outpatient Follow-up:  1. Follow up outpatient CBC/CMP 2. Patient should continue nightly BIPAP at SNF.  Please ensure follow up with outpatient sleep study.  Pt qualifies for bipap given obesity hypoventilation documented hypercarbia on multiple blood gases.  ABG from 2/26 7.316/77.  VBG from 2/27 7.338/75.8.  VBG from 2/28 7.325/85.7.  Please ensure patient has BIPAP ordered for home prior to discharge from SNF. 3. Ensure follow up with cardiology on 3/4 as scheduled 4. Ensure follow up with PCP 5. Continue to follow daily weights, volume status, kidney function.  Adjust diuresis as needed. 6. Discharged on symbicort + atrovent.  Consider transitioning to trelegy ellipta or adding spiriva pending costs. 7. Continue to titrate O2 to maintain O2 sats 88-92% 8. Follow pulmonary hypertension with cardiology outpatient.  Ensure outpatient sleep study.  9. Referral to pulmonology placed 10. Follow prediabetes outpatient 11. Follow outpatient CXR  Discharge Diagnoses:  Principal Problem:   Acute on chronic combined systolic and diastolic congestive heart failure, NYHA class 4 (HCC) Active Problems:   DOE (dyspnea on exertion)   Chronic combined systolic and diastolic heart failure (HCC)   HTN (hypertension)   DCM (dilated cardiomyopathy) (HCC)   PUD (peptic ulcer disease)   Pulmonary HTN (HCC)   Discharge Condition: stable  Diet recommendation: heart healthy  Filed Weights   08/05/18 0513 08/06/18 0515 08/08/18 0600  Weight: 106.8 kg 106.2 kg 105.3 kg    History of present illness:  68 year old male, PMH of dilated cardiomyopathy, chronic combined systolic and diastolic CHF, pulmonary hypertension, CAD, HTN, LBBB, asthma/COPD, esophagitis/PUD/upper GI bleed, prediabetes,  presented to ED on 07/21/2018 due to progressive weight gain and dyspnea. He was initially admitted to the floor for decompensated CHF. On 2/14, he decompensated with hypercapnia and acute encephalopathy, transferred to stepdown and started on BiPAP but had to be transferred to ICU for intubation and mechanical ventilation. CCM took over care. Advanced HF team consulted. Extubated 2/19. Improved and stabilized and [facility]. Cardiology continue to see and performed left and right heart cath 2/24. Slowly improving. Persistent hypoxia.  He was admitted for heart failure exacerbation.  He developed hypercapnia and AMS requiring intubation on 2/14.  He was extubated on 2/19.  He's gradually improved with diuresis.  He was also treated with antibiotics for possible pneumonia and has been treated with nebs for COPD.  Now on nightly bipap for suspected OSA/OHS.  Echo showed worsening EF.  S/p L and RHC notable for non obstructive CAD, EF 30-35%, moderate pulm HTN due to OSA/OHS.    Hospital Course:  1. Acute on chronic combined systolic and diastolic CHF: Advanced HF team consulted and assisted with care. TTE 07/22/2018: LVEF 25-30%.  1. Treated early on in admission with IV Lasix, Diamox and metolazone.  2. - 11.8L since admission.  3. Currently on Lasix 60 mg daily,Aldactone 12.5 mg, entresto 24/26 BID, coreg 3.125 BID 4. As per cardiology, due to new low EF, underwent right and left heart cath on 2/24, report as below, minimal nonobstructive CAD, NICM EF 30-35%, moderate pulmonary hypertension due primarily due to OSA/OHS and well compensated volume status.  5.  PT and OT recommend SNF.  6. Continue to follow intermittent labs.  Has metabolic alkalosis today.  VBG shows respiratory acidosis  with metabolic alkalosis, though pH normal.  Likely 2/2 compensation from chronic respiratory acidosis vs contribution from contraction alkalosis as well,  continue to monitor.   2. Acute on chronic hypoxic and hypercapnic respiratory failure:Likely multifactorial, COPD exacerbation, decompensated CHF, questionable pneumonia over probable OSA/OHS. Extubated 2/19. Aggressive pulmonary toilet. Has been on nightly BiPAP for the last couple of days.  Recently has c/o hoarseness. Minimize opioids. Tracheal aspirate culture showed diphtheroids. Has completed 7 days course of antibiotics (azithromycin/doxycycline)-discontinued. Needs outpatient sleep study, BiPAP at discharge. Case management consulted to see if he qualifies for [facility] SNF. Persistently hypoxic on room air at rest and was recently requiring 2 L/min oxygen to come up to 88%. 1. 2/26 had episode of AMS and was hypercarbic.  He's improved after bipap last night.  Encouraged use of bipap.  Nursing to discuss with RT.   2. He needs continued nightly BIPAP after discharge.  He needs to follow up with outpatient sleep study after discharge. 3. Continue to titrate O2 for goal saturation 88-92% 4. Pulmonology referral placed  3. Suspected OSA/OHS:Will need outpatient sleep study after discharge.  4. Acute on stage III chronic kidney disease:Last known baseline creatinine in May 2018 was in 1.1-1.2 range. Presented with creatinine of 1.5. This peaked to 2.70 this admission. Creatinine has normalized and remains appropriate. Follow BMP closely.  5. Acute metabolic encephalopathy:Resolved.  Had episode of confusion 2/26.  Improved today.  Secondary to hypercarbia.  Needs nightly bipap after discharge.  6. Prediabetes:A1c 6. CBGs well controlled in the hospital and not requiring much insulins. DC CBG checks and SSI. Outpatient follow-up.  7. Essential hypertension: Controlled.Continue spiro, lasix, entresto, coreg as noted above.  8. History of PUD/esophagitis/upper GI bleed:Continue PPI.  9. Deconditioning: PT and OT recommend SNF.  10. Pulmonary hypertension:  Moderate pulmonary hypertension by heart cath 2/24. 1. Follow up with cards outpatient  11. Constipation:Aggressive bowel regimen.   12. Leukocytosis:May be due to recent steroids. No overt infectious etiology. Continues to improve. Follow CBCs.  13. Hypernatremia:Related to recent aggressive diuresis. Resolved.  14. Hypokalemia:Replaced. Magnesium normal.   Procedures: Cath  Prox LAD lesion is 30% stenosed.  Prox RCA to Mid RCA lesion is 30% stenosed.  Prox Cx lesion is 30% stenosed.  Findings:  Ao = 107/69 (87)  LV = 119/13 RA = 5 RV = 64/11 PA = 71/23 (39) PCW = 21 Fick cardiac output/index = 5.6/2.6 PVR = 3.1 WU Ao sat = 94% PA sat = 64%, 65%  Assessment: 1. Minimal non-obstructive CAD 2. NICM EF 30-35% 3. Moderate pulmonary HTN due primarily to OSA/OHS 4. Relative well-compensated volume status  Plan/Discussion:  Continue medical therapy. Will need aggressive management of OHS/OSA. Can go home tomorrow from our standpoint if otherwise stable.   Echo IMPRESSIONS   1. The left ventricle has severely reduced systolic function of 25-30%. The cavity size was normal. Left ventricular diastolic Doppler parameters are consistent with impaired relaxation Left ventrical global hypokinesis without obvious regional wall  motion abnormalities. The patient refused Definity contrast, the evaluation of wall motion is suboptimal. 2. The right ventricle has normal systolic function. The cavity was moderately enlarged. There is no increase in right ventricular wall thickness. Right ventricular systolic pressure normal with an estimated pressure of 28.6 mmHg. 3. Left atrial size was moderately dilated. 4. Right atrial size was severely dilated. 5. The mitral valve is normal in structure. 6. The tricuspid valve is normal in structure. 7. The aortic valve is normal in structure.  Consultations:  Cardiology  PCCM  Discharge Exam: Vitals:    08/08/18 0600 08/08/18 0736  BP:  121/67  Pulse:  100  Resp:  18  Temp:  98.2 F (36.8 C)  SpO2: 92% 94%   Feeling well. Feels better. Looking forward to d/c.  General: No acute distress. Cardiovascular: Heart sounds show Cregg Jutte regular rate, and rhythm. Lungs: Clear to auscultation bilaterally  Abdomen: protuberant abdomen, soft, nontender Neurological: Alert and oriented 3. Moves all extremities 4. Cranial nerves II through XII grossly intact. Skin: Warm and dry. No rashes or lesions. Extremities: trace edema Psychiatric: Mood and affect are normal. Insight and judgment are appropriate.  Discharge Instructions   Discharge Instructions    (HEART FAILURE PATIENTS) Call MD:  Anytime you have any of the following symptoms: 1) 3 pound weight gain in 24 hours or 5 pounds in 1 week 2) shortness of breath, with or without Nickie Warwick dry hacking cough 3) swelling in the hands, feet or stomach 4) if you have to sleep on extra pillows at night in order to breathe.   Complete by:  As directed    Ambulatory referral to Pulmonology   Complete by:  As directed    Sleep study ordered.  Will need f/u with sleep provider.   Call MD for:  difficulty breathing, headache or visual disturbances   Complete by:  As directed    Call MD for:  difficulty breathing, headache or visual disturbances   Complete by:  As directed    Call MD for:  extreme fatigue   Complete by:  As directed    Call MD for:  extreme fatigue   Complete by:  As directed    Call MD for:  hives   Complete by:  As directed    Call MD for:  hives   Complete by:  As directed    Call MD for:  persistant dizziness or light-headedness   Complete by:  As directed    Call MD for:  persistant dizziness or light-headedness   Complete by:  As directed    Call MD for:  persistant nausea and vomiting   Complete by:  As directed    Call MD for:  persistant nausea and vomiting   Complete by:  As directed    Call MD for:  redness, tenderness,  or signs of infection (pain, swelling, redness, odor or green/yellow discharge around incision site)   Complete by:  As directed    Call MD for:  redness, tenderness, or signs of infection (pain, swelling, redness, odor or green/yellow discharge around incision site)   Complete by:  As directed    Call MD for:  severe uncontrolled pain   Complete by:  As directed    Call MD for:  severe uncontrolled pain   Complete by:  As directed    Call MD for:  temperature >100.4   Complete by:  As directed    Call MD for:  temperature >100.4   Complete by:  As directed    Diet - low sodium heart healthy   Complete by:  As directed    Diet - low sodium heart healthy   Complete by:  As directed    Discharge instructions   Complete by:  As directed    You were seen for Kenyanna Grzesiak heart failure exacerbation.  You were also treated for COPD and respiratory infection with nebulizer treatments and antibiotics.  Your had an echo that showed worsening heart function and Lechelle Wrigley  left and right heart cath that showed on obstructive coronary artery disease, pulmonary hypertension.  You were seen by cardiology who adjusted your heart failure medications.  Please follow up with cardiology as an outpatient for your heart failure and pulmonary hypertension.  You need Emmett Bracknell sleep study as an outpatient.  It's extremely important that you use bipap nightly.  Continue your symbicort.  We've started you on atrovent.  Follow up with your PCP regarding potentially starting another long acting inhaler or transitioning to Klynn Linnemann different combination inhaler.  Check your weights daily.  If you gain more than 2-3 lbs in 1 day or more than 5 lbs in 1 week, please call your doctor.  Return for new, recurrent, or worsening symptoms.   Please ask your PCP to request records from this hospitalization so they know what was done and what the next steps will be.   Heart Failure patients record your daily weight using the same scale at the same time  of day   Complete by:  As directed    Increase activity slowly   Complete by:  As directed    Increase activity slowly   Complete by:  As directed    Polysomnography 4 or more parameters   Complete by:  As directed    Where should this test be performed:  [facility] - Pulmonary   Please route results to primary care provider     Allergies as of 08/08/2018      Reactions   Penicillins    Pt reports it makes him feel shaky Has patient had Nataleigh Griffin PCN reaction causing immediate rash, facial/tongue/throat swelling, SOB or lightheadedness with hypotension: YES Has patient had Dvora Buitron PCN reaction causing severe rash involving mucus membranes or skin necrosis: NO Has patient had Leauna Sharber PCN reaction that required hospitalization NO Has patient had Roney Youtz PCN reaction occurring within the last 10 years: NO If all of the above answers are "NO", then may proceed with Cephalosporin use.      Medication List    STOP taking these medications   omeprazole 40 MG capsule Commonly known as:  PRILOSEC   sacubitril-valsartan 49-51 MG Commonly known as:  ENTRESTO Replaced by:  sacubitril-valsartan 24-26 MG     TAKE these medications   acetaminophen 325 MG tablet Commonly known as:  TYLENOL Take 650 mg by mouth every 6 (six) hours as needed.   albuterol (2.5 MG/3ML) 0.083% nebulizer solution Commonly known as:  PROVENTIL Inhale 3 mLs into the lungs every 6 (six) hours as needed for wheezing or shortness of breath.   aspirin 81 MG chewable tablet Chew 1 tablet (81 mg total) by mouth daily for 30 days. Start taking on:  August 09, 2018   atorvastatin 40 MG tablet Commonly known as:  LIPITOR Take 1 tablet (40 mg total) by mouth daily at 6 PM for 30 days.   budesonide-formoterol 80-4.5 MCG/ACT inhaler Commonly known as:  SYMBICORT Inhale 1 puff into the lungs 2 (two) times daily.   carvedilol 3.125 MG tablet Commonly known as:  COREG Take 1 tablet (3.125 mg total) by mouth 2 (two) times daily with Francoise Chojnowski meal for 30  days. What changed:    medication strength  See the new instructions.   ferrous sulfate 325 (65 FE) MG tablet Take 325 mg by mouth daily with breakfast.   furosemide 20 MG tablet Commonly known as:  LASIX Take 3 tablets (60 mg total) by mouth daily for 30 days. Start taking on:  August 09, 2018 What changed:    how much to take  Another medication with the same name was removed. Continue taking this medication, and follow the directions you see here.   ipratropium 0.02 % nebulizer solution Commonly known as:  ATROVENT Take 2.5 mLs (0.5 mg total) by nebulization 2 (two) times daily.   pantoprazole 40 MG tablet Commonly known as:  PROTONIX Take 1 tablet (40 mg total) by mouth daily. Please make appt for future refills 1st attempt. What changed:  See the new instructions.   potassium chloride 10 MEQ tablet Commonly known as:  K-DUR Take 2 tablets (20 mEq total) by mouth daily for 30 days.   sacubitril-valsartan 24-26 MG Commonly known as:  ENTRESTO Take 1 tablet by mouth 2 (two) times daily for 30 days. Replaces:  sacubitril-valsartan 49-51 MG   spironolactone 25 MG tablet Commonly known as:  ALDACTONE TAKE 1/2 TABLET(12.5 MG) BY MOUTH DAILY      Allergies  Allergen Reactions  . Penicillins     Pt reports it makes him feel shaky Has patient had Gargi Berch PCN reaction causing immediate rash, facial/tongue/throat swelling, SOB or lightheadedness with hypotension: YES Has patient had Eliora Nienhuis PCN reaction causing severe rash involving mucus membranes or skin necrosis: NO Has patient had Caili Escalera PCN reaction that required hospitalization NO Has patient had Kanyia Heaslip PCN reaction occurring within the last 10 years: NO If all of the above answers are "NO", then may proceed with Cephalosporin use.    Contact information for follow-up providers    [facility] Follow up on 08/13/2018.   Specialty:  Cardiology Why:  Heart Failure Follow up 08/13/2018 @ 2:30  pm -Parking in ER lot (enter under blue awning to left of ER), or underneath [facility] in the [place] on [address] (garage code:[id number], elevator to 1st floor).  -Take all am meds and bring all med bottles  Contact information: [address] [id number] mc [address] [place] [address] [phone]       Shirlean Mylar, MD Follow up.   Specialty:  Family Medicine Contact information: [address] [place] [address] [phone]        Quintella Reichert, MD .   Specialty:  Cardiology Contact information: [id number] [address] [place] [address] [phone]        [facility] Follow up.   Specialty:  Pulmonology Why:  Call for follow up Contact information: [address] [place] [address] [phone]           Contact information for after-discharge care    Destination    [facility] Preferred SNF .   Service:  Skilled Nursing Contact information: [address] [place] [address] [phone]                   The results of significant diagnostics from this hospitalization (including imaging, microbiology, ancillary and laboratory) are listed below for reference.    Significant Diagnostic Studies: Dg Chest 2 View  Result Date: 07/21/2018 CLINICAL DATA:  Shortness of breath and productive cough for 3-4 weeks. History of smoking. EXAM: CHEST - 2 VIEW COMPARISON:  01/14/2018 FINDINGS: Enlargement of the cardiac silhouette is unchanged. The patient has taken Castulo Scarpelli shallower inspiration than on the prior study. There is peribronchial thickening. No airspace consolidation, overt edema, pleural effusion, or pneumothorax is identified. No acute osseous abnormality is seen. IMPRESSION: Bronchitic changes. Electronically Signed   By:  Sebastian Ache M.D.   On: 07/21/2018 12:48   Dg Abd 1 View  Result Date: 07/26/2018 CLINICAL DATA:  Tube placement EXAM:  ABDOMEN - 1 VIEW COMPARISON:  None. FINDINGS: Nasogastric tube with tip in the stomach. Side port at the GE junction. IMPRESSION: NG tube is tip in the stomach.  Side port at the GE junction Electronically Signed   By: Genevive Bi M.D.   On: 07/26/2018 18:21   Dg Abd 1 View  Result Date: 07/26/2018 CLINICAL DATA:  Abdominal distension. EXAM: ABDOMEN - 1 VIEW COMPARISON:  None. FINDINGS: No cause for abdominal distention identified. Bowel gas pattern is unremarkable. No free air, portal venous gas, or pneumatosis on the limited supine film. IMPRESSION: No cause for abdominal distension or acute abnormality noted on this supine film. Electronically Signed   By: Gerome Sam III M.D   On: 07/26/2018 13:32   Dg Chest Port 1 View  Result Date: 07/31/2018 CLINICAL DATA:  Acute respiratory failure and hypoxia EXAM: PORTABLE CHEST 1 VIEW COMPARISON:  07/30/2018 FINDINGS: Endotracheal and NG tubes have been removed. Stable left jugular central venous catheter. Lungs are under aerated with bibasilar atelectasis. Stable small left pleural effusion. No pneumothorax. IMPRESSION: Extubated.  Bibasilar atelectasis.  Small left pleural effusion. Electronically Signed   By: Jolaine Click M.D.   On: 07/31/2018 09:03   Dg Chest Port 1 View  Result Date: 07/30/2018 CLINICAL DATA:  Respiratory failure EXAM: PORTABLE CHEST 1 VIEW COMPARISON:  Yesterday FINDINGS: Endotracheal tube tip between the clavicular heads and carina. Orogastric tube and side-port at least reaching the stomach. Left IJ line with tip directed towards the midline, stable since placement. Small left pleural effusion. Mild streaky density behind the heart. Cardiomegaly. IMPRESSION: 1. Low volume chest with mild atelectasis and small pleural effusion on the left. 2. Stable hardware positioning. Electronically Signed   By: Marnee Spring M.D.   On: 07/30/2018 09:25   Dg Chest Port 1 View  Result Date: 07/29/2018 CLINICAL DATA:  Respiratory failure  EXAM: PORTABLE CHEST 1 VIEW COMPARISON:  Yesterday FINDINGS: Endotracheal tube tip just below the clavicular heads. Left IJ line with tip at the distal left brachiocephalic. The orogastric tube at least reaches the stomach. Low volume chest with hazy opacity at the bases. No Kerley lines. No effusion or pneumothorax. Cardiomegaly. IMPRESSION: 1. Stable hardware positioning. 2. Stable low volume chest with presumed atelectasis at the bases. Electronically Signed   By: Marnee Spring M.D.   On: 07/29/2018 08:00   Dg Chest Port 1 View  Result Date: 07/28/2018 CLINICAL DATA:  Endotracheal tube in respiratory failure EXAM: PORTABLE CHEST 1 VIEW COMPARISON:  Yesterday FINDINGS: Endotracheal tube tip at the clavicular heads. Left IJ line with tip at the brachiocephalic SVC origin. The orogastric tube at least reaches the stomach. Chronic cardiopericardial enlargement. Chronic low lung volumes with interstitial opacity at the bases. IMPRESSION: 1. Unremarkable hardware positioning. 2. Persistent low lung volumes.  Chronic cardiomegaly. Electronically Signed   By: Marnee Spring M.D.   On: 07/28/2018 07:19   Dg Chest Port 1 View  Result Date: 07/27/2018 CLINICAL DATA:  Hypoxia EXAM: PORTABLE CHEST 1 VIEW COMPARISON:  July 26, 2018 FINDINGS: Endotracheal tube tip is 4.0 cm above the carina. Nasogastric tube tip and side port are in the stomach. Central catheter tip is in the left innominate vein. No pneumothorax. There is cardiomegaly with pulmonary vascularity normal. There Is medial bibasilar atelectasis. Lungs elsewhere are clear. There is aortic atherosclerosis. No  bone lesions. IMPRESSION: Tube and catheter positions as described without pneumothorax. Persistent cardiomegaly. Medial bibasilar atelectasis present. Aortic Atherosclerosis (ICD10-I70.0). Electronically Signed   By: Bretta Bang III M.D.   On: 07/27/2018 07:47   Dg Chest Port 1 View  Result Date: 07/26/2018 CLINICAL DATA:  Intubation  EXAM: PORTABLE CHEST 1 VIEW COMPARISON:  07/26/2018 FINDINGS: Endotracheal tube approximately 6 cm from carina. NG tube in stomach. Central venous line unchanged. Normal cardiac silhouette.  Lungs are clear. IMPRESSION: Endotracheal tube appears in good position Electronically Signed   By: Genevive Bi M.D.   On: 07/26/2018 18:20   Dg Chest Port 1 View  Result Date: 07/26/2018 CLINICAL DATA:  Central line placement and intubation. EXAM: PORTABLE CHEST 1 VIEW COMPARISON:  07/24/2018 and 07/21/2018. FINDINGS: Endotracheal tube tip is in the mid trachea. Left IJ central venous catheter projects to the right of the aortic arch, tip over the trachea at the level of the upper SVC. The heart size and mediastinal contours are stable. Probable emphysematous changes at the right apex and mild bibasilar atelectasis. No pneumothorax or significant pleural effusion. IMPRESSION: Satisfactory position of the endotracheal tube. Central line terminates at the level of the upper SVC, adjacent to the aortic arch; clinical correlation necessary to confirm venous location. No pneumothorax. Electronically Signed   By: Carey Bullocks M.D.   On: 07/26/2018 14:15   Dg Chest Port 1 View  Result Date: 07/24/2018 CLINICAL DATA:  Shortness of breath EXAM: PORTABLE CHEST 1 VIEW COMPARISON:  Chest radiograph 07/21/2018 FINDINGS: Monitoring leads overlie the patient. Low lung volumes. Stable cardiomegaly. Heterogeneous opacities left lung base. Possible small left pleural effusion. IMPRESSION: Heterogeneous opacities left lung base may represent atelectasis or infection. Possible small left effusion. Electronically Signed   By: Annia Belt M.D.   On: 07/24/2018 10:54    Microbiology: No results found for this or any previous visit (from the past 240 hour(s)).   Labs: Basic Metabolic Panel: Recent Labs  Lab 08/04/18 0555  08/04/18 1126 08/05/18 0455 08/06/18 0413 08/07/18 0336 08/08/18 0430  NA 142   < > 144  146* 142  140 138 140  K 3.8   < > 3.9  3.6 3.7 3.8 4.4 4.0  CL 100  --   --  101 96* 95* 94*  CO2 32  --   --  33* 35* 32 38*  GLUCOSE 110*  --   --  99 108* 90 96  BUN 17  --   --  10 16 14 13   CREATININE 0.91  --   --  0.74 0.84 0.75 0.84  CALCIUM 9.2  --   --  8.7* 8.9 8.6* 9.0  MG 2.1  --   --  2.2 2.1 2.0 2.0  PHOS 3.7  --   --  3.1 3.0 2.8 2.4*   < > = values in this interval not displayed.   Liver Function Tests: No results for input(s): AST, ALT, ALKPHOS, BILITOT, PROT, ALBUMIN in the last 168 hours. No results for input(s): LIPASE, AMYLASE in the last 168 hours. No results for input(s): AMMONIA in the last 168 hours. CBC: Recent Labs  Lab 08/02/18 0620 08/03/18 0623 08/04/18 0555  08/04/18 1126 08/05/18 0455 08/06/18 0413 08/07/18 0736 08/08/18 0430  WBC 23.5* 19.6* 17.4*  --   --  17.7* 15.4* 15.2* 13.1*  NEUTROABS 20.4* 16.6* 14.1*  --   --  15.1* 12.8*  --   --   HGB 12.9* 12.9* 12.9*   < >  13.9  13.6 12.0* 12.4* 12.3* 12.1*  HCT 45.4 46.1 45.1   < > 41.0  40.0 43.1 44.7 43.2 42.7  MCV 91.3 90.4 91.3  --   --  92.9 91.4 90.8 90.7  PLT 264 263 267  --   --  267 300 326 341   < > = values in this interval not displayed.   Cardiac Enzymes: No results for input(s): CKTOTAL, CKMB, CKMBINDEX, TROPONINI in the last 168 hours. BNP: BNP (last 3 results) Recent Labs    07/21/18 1123  BNP 964.4*    ProBNP (last 3 results) No results for input(s): PROBNP in the last 8760 hours.  CBG: Recent Labs  Lab 08/02/18 1616 08/02/18 2102 08/03/18 0748 08/03/18 2236 08/06/18 1758  GLUCAP 108* 126* 135* 110* 112*       Signed:  Lacretia Nicks MD.  [facility] 08/08/2018, 12:25 PM

## 2018-08-13 ENCOUNTER — Other Ambulatory Visit (HOSPITAL_COMMUNITY): Payer: Self-pay | Admitting: Student

## 2018-08-13 ENCOUNTER — Ambulatory Visit (HOSPITAL_COMMUNITY)
Admit: 2018-08-13 | Discharge: 2018-08-13 | Disposition: A | Discharge status: Home / Self Care | Payer: Medicare Other | Source: Ambulatory Visit | Attending: Internal Medicine | Admitting: Internal Medicine

## 2018-08-13 ENCOUNTER — Other Ambulatory Visit: Payer: Self-pay

## 2018-08-13 ENCOUNTER — Encounter (HOSPITAL_COMMUNITY): Payer: Self-pay

## 2018-08-13 VITALS — BP 116/80 | HR 93 | Wt 239.4 lb

## 2018-08-13 DIAGNOSIS — R0683 Snoring: Secondary | ICD-10-CM

## 2018-08-13 DIAGNOSIS — I420 Dilated cardiomyopathy: Secondary | ICD-10-CM

## 2018-08-13 DIAGNOSIS — I5042 Chronic combined systolic (congestive) and diastolic (congestive) heart failure: Secondary | ICD-10-CM | POA: Diagnosis not present

## 2018-08-13 DIAGNOSIS — Z87891 Personal history of nicotine dependence: Secondary | ICD-10-CM | POA: Insufficient documentation

## 2018-08-13 DIAGNOSIS — E1122 Type 2 diabetes mellitus with diabetic chronic kidney disease: Secondary | ICD-10-CM | POA: Insufficient documentation

## 2018-08-13 DIAGNOSIS — I2510 Atherosclerotic heart disease of native coronary artery without angina pectoris: Secondary | ICD-10-CM | POA: Diagnosis not present

## 2018-08-13 DIAGNOSIS — I130 Hypertensive heart and chronic kidney disease with heart failure and stage 1 through stage 4 chronic kidney disease, or unspecified chronic kidney disease: Secondary | ICD-10-CM | POA: Insufficient documentation

## 2018-08-13 DIAGNOSIS — I272 Pulmonary hypertension, unspecified: Secondary | ICD-10-CM | POA: Diagnosis not present

## 2018-08-13 DIAGNOSIS — N183 Chronic kidney disease, stage 3 (moderate): Secondary | ICD-10-CM | POA: Insufficient documentation

## 2018-08-13 DIAGNOSIS — I447 Left bundle-branch block, unspecified: Secondary | ICD-10-CM | POA: Diagnosis not present

## 2018-08-13 DIAGNOSIS — N179 Acute kidney failure, unspecified: Secondary | ICD-10-CM | POA: Insufficient documentation

## 2018-08-13 DIAGNOSIS — J441 Chronic obstructive pulmonary disease with (acute) exacerbation: Secondary | ICD-10-CM | POA: Insufficient documentation

## 2018-08-13 DIAGNOSIS — Z8249 Family history of ischemic heart disease and other diseases of the circulatory system: Secondary | ICD-10-CM | POA: Insufficient documentation

## 2018-08-13 DIAGNOSIS — Z7901 Long term (current) use of anticoagulants: Secondary | ICD-10-CM | POA: Diagnosis not present

## 2018-08-13 DIAGNOSIS — Z79899 Other long term (current) drug therapy: Secondary | ICD-10-CM | POA: Diagnosis not present

## 2018-08-13 DIAGNOSIS — I10 Essential (primary) hypertension: Secondary | ICD-10-CM

## 2018-08-13 DIAGNOSIS — Z7982 Long term (current) use of aspirin: Secondary | ICD-10-CM | POA: Insufficient documentation

## 2018-08-13 DIAGNOSIS — Z9981 Dependence on supplemental oxygen: Secondary | ICD-10-CM | POA: Insufficient documentation

## 2018-08-13 DIAGNOSIS — D638 Anemia in other chronic diseases classified elsewhere: Secondary | ICD-10-CM

## 2018-08-13 LAB — CBC
HCT: 42.1 % (ref 39.0–52.0)
Hemoglobin: 12 g/dL — ABNORMAL LOW (ref 13.0–17.0)
MCH: 25.3 pg — ABNORMAL LOW (ref 26.0–34.0)
MCHC: 28.5 g/dL — ABNORMAL LOW (ref 30.0–36.0)
MCV: 88.8 fL (ref 80.0–100.0)
Platelets: 402 10*3/uL — ABNORMAL HIGH (ref 150–400)
RBC: 4.74 MIL/uL (ref 4.22–5.81)
RDW: 16.9 % — AB (ref 11.5–15.5)
WBC: 9.3 10*3/uL (ref 4.0–10.5)
nRBC: 0 % (ref 0.0–0.2)

## 2018-08-13 LAB — BASIC METABOLIC PANEL
Anion gap: 10 (ref 5–15)
BUN: 10 mg/dL (ref 8–23)
CHLORIDE: 99 mmol/L (ref 98–111)
CO2: 27 mmol/L (ref 22–32)
Calcium: 8.8 mg/dL — ABNORMAL LOW (ref 8.9–10.3)
Creatinine, Ser: 1.11 mg/dL (ref 0.61–1.24)
GFR calc Af Amer: >60 mL/min (ref >60)
GFR calc non Af Amer: >60 mL/min (ref >60)
Glucose, Bld: 102 mg/dL — ABNORMAL HIGH (ref 70–99)
Potassium: 4.4 mmol/L (ref 3.5–5.1)
Sodium: 136 mmol/L (ref 135–145)

## 2018-08-13 MED ORDER — SPIRONOLACTONE 25 MG PO TABS: 25.0000 mg | ORAL_TABLET | Freq: Every day | ORAL | 0 refills | Status: DC

## 2018-08-13 NOTE — Patient Instructions (Signed)
Lab work done today. We will contact you with any abnormal lab work.  Lab work will need to be done again in 10-14 days.  Your physician has recommended that you have a sleep study. This test records several body functions during sleep, including: brain activity, eye movement, oxygen and carbon dioxide blood levels, heart rate and rhythm, breathing rate and rhythm, the flow of air through your mouth and nose, snoring, body muscle movements, and chest and belly movement.  INCREASE Spironolactone 25mg  (1 tab) daily  Follow up with the Advanced Practice Provider in 4 weeks.  Follow up with Dr. Gala Romney in 3 months with an echocardiogram. Your physician has requested that you have an echocardiogram. Echocardiography is a painless test that uses sound waves to create images of your heart. It provides your doctor with information about the size and shape of your heart and how well your heart's chambers and valves are working. This procedure takes approximately one hour. There are no restrictions for this procedure.

## 2018-08-13 NOTE — Progress Notes (Signed)
[facility] Note   Referring Physician: PCP: Shirlean Mylar, MD PCP-Cardiologist: Armanda Magic, MD   HPI:  Kristopher [name] is a 68 y.o. male with obesity, HTN, DM2, Asthma, Chronic combined diastolic/systolic CHF, and NICM.   Admitted 2/10 - 08/08/2018 with mixed AECOPD and A/C combined CHF. Initially required intubation in the setting of resp failure. Had [facility] as below in the setting of new drop in EF to 25-30%. Medications titrated as tolerated. Continued to require O2 (new for him). He was discharged to SNF with CPAP (also new for him).   He presents today for regular follow up with his daughter. He is feeling much better. Tolerating CPAP at the facility. Has not yet been set up for sleep study. They estimate he will be in rehab for another 2-3 weeks. Tolerating his medications well. He gets by with his ADLs without SOB on O2. SOB with any exertion without O2. Denies wheezing. He was a previous smoker but stopped > 25 years ago. Taking all medications as directed with help of SNF.   Review of systems complete and found to be negative unless listed in HPI.    [facility] 08/04/18  Prox LAD lesion is 30% stenosed.  Prox RCA to Mid RCA lesion is 30% stenosed.  Prox Cx lesion is 30% stenosed. Findings: Ao = 107/69 (87)  LV = 119/13 RA = 5 RV = 64/11 PA = 71/23 (39) PCW = 21 Fick cardiac output/index = 5.6/2.6 PVR = 3.1 WU Ao sat = 94% PA sat = 64%, 65% Assessment: 1. Minimal non-obstructive CAD 2. NICM EF 30-35% 3. Moderate pulmonary HTN due primarily to OSA/OHS 4. Relative well-compensated volume status  Echo 07/22/18: EF 25-30%, global HK, RV normal function, increased wall thickness, LA moderately dilated, RA severely dilated  Review of systems complete and found to be negative unless listed in HPI.    Past Medical History:  Diagnosis Date  . Acute combined systolic and diastolic heart failure (HCC) 08/11/2016  . Acute esophagitis 08/17/2016  . Acute upper GI  bleed 08/10/2016  . Asthma   . CAD (coronary artery disease) 08/16/2016  . CHF (congestive heart failure) (HCC)   . DCM (dilated cardiomyopathy) (HCC)   . Dyspnea   . HTN (hypertension)   . LBBB (left bundle branch block) 08/10/2016  . Lymphadenopathy 08/17/2016  . PUD (peptic ulcer disease) 08/17/2016  . Pulmonary HTN (HCC) 08/17/2016    Current Outpatient Medications  Medication Sig Dispense Refill  . acetaminophen (TYLENOL) 325 MG tablet Take 650 mg by mouth every 6 (six) hours as needed.    Marland Kitchen albuterol (PROVENTIL) (2.5 MG/3ML) 0.083% nebulizer solution Inhale 3 mLs into the lungs every 6 (six) hours as needed for wheezing or shortness of breath.    Marland Kitchen aspirin 81 MG chewable tablet Chew 1 tablet (81 mg total) by mouth daily for 30 days. 30 tablet 0  . atorvastatin (LIPITOR) 40 MG tablet Take 1 tablet (40 mg total) by mouth daily at 6 PM for 30 days. 30 tablet 0  . budesonide-formoterol (SYMBICORT) 80-4.5 MCG/ACT inhaler Inhale 1 puff into the lungs 2 (two) times daily. 1 Inhaler 12  . carvedilol (COREG) 3.125 MG tablet Take 1 tablet (3.125 mg total) by mouth 2 (two) times daily with a meal for 30 days. 60 tablet 0  . ferrous sulfate 325 (65 FE) MG tablet Take 325 mg by mouth daily with breakfast.    . furosemide (LASIX) 20 MG tablet Take 3 tablets (60 mg  total) by mouth daily for 30 days. 90 tablet 0  . ipratropium (ATROVENT) 0.02 % nebulizer solution Take 2.5 mLs (0.5 mg total) by nebulization 2 (two) times daily. 75 mL 12  . pantoprazole (PROTONIX) 40 MG tablet Take 1 tablet (40 mg total) by mouth daily. Please make appt for future refills 1st attempt. 30 tablet 0  . sacubitril-valsartan (ENTRESTO) 24-26 MG Take 1 tablet by mouth 2 (two) times daily for 30 days. 60 tablet 0  . spironolactone (ALDACTONE) 25 MG tablet TAKE 1/2 TABLET(12.5 MG) BY MOUTH DAILY 30 tablet 0  . potassium chloride (K-DUR) 10 MEQ tablet Take 2 tablets (20 mEq total) by mouth daily for 30 days. (Patient not taking: Reported  on 08/13/2018) 60 tablet 0   No current facility-administered medications for this encounter.     Allergies  Allergen Reactions  . Penicillins     Pt reports it makes him feel shaky Has patient had a PCN reaction causing immediate rash, facial/tongue/throat swelling, SOB or lightheadedness with hypotension: YES Has patient had a PCN reaction causing severe rash involving mucus membranes or skin necrosis: NO Has patient had a PCN reaction that required hospitalization NO Has patient had a PCN reaction occurring within the last 10 years: NO If all of the above answers are "NO", then may proceed with Cephalosporin use.      Social History   Socioeconomic History  . Marital status: Single    Spouse name: Not on file  . Number of children: Not on file  . Years of education: Not on file  . Highest education level: Not on file  Occupational History  . Not on file  Social Needs  . Financial resource strain: Not on file  . Food insecurity:    Worry: Not on file    Inability: Not on file  . Transportation needs:    Medical: Not on file    Non-medical: Not on file  Tobacco Use  . Smoking status: Former Games developer  . Smokeless tobacco: Never Used  Substance and Sexual Activity  . Alcohol use: No  . Drug use: No  . Sexual activity: Not on file  Lifestyle  . Physical activity:    Days per week: Not on file    Minutes per session: Not on file  . Stress: Not on file  Relationships  . Social connections:    Talks on phone: Not on file    Gets together: Not on file    Attends religious service: Not on file    Active member of club or organization: Not on file    Attends meetings of clubs or organizations: Not on file    Relationship status: Not on file  . Intimate partner violence:    Fear of current or ex partner: Not on file    Emotionally abused: Not on file    Physically abused: Not on file    Forced sexual activity: Not on file  Other Topics Concern  . Not on file  Social  History Narrative  . Not on file      Family History  Problem Relation Age of Onset  . Hypertension Mother   . Hypertension Sister     Vitals:   08/13/18 1415  BP: 116/80  Pulse: 93  SpO2: 95%  Weight: 108.6 kg (239 lb 6.4 oz)   Wt Readings from Last 3 Encounters:  08/13/18 108.6 kg (239 lb 6.4 oz)  08/08/18 105.3 kg (232 lb 3.2 oz)  10/16/16 99.3 kg (  219 lb)    PHYSICAL EXAM: General:  NAD. In WC.  HEENT: Normal. O2 via [place]. Neck: supple. JVP 7-8 cm. Carotids 2+ bilat; no bruits. No lymphadenopathy or thyromegaly appreciated. Cor: PMI nondisplaced. Regular rate & rhythm. No rubs, gallops or murmurs. Lungs: clear Abdomen: soft, nontender, nondistended. No hepatosplenomegaly. No bruits or masses. Good bowel sounds. Extremities: no cyanosis, clubbing, rash, edema Neuro: alert & oriented x 3, cranial nerves grossly intact. moves all 4 extremities w/o difficulty. Affect pleasant.  ASSESSMENT & PLAN:  1. Acute on chronic systolic/diastolic HF - Echo 01/07/17 EF 28%. Cath with minimal CAD - Echo 07/22/18: EF 25-30%, global HK, RV normal function, increased wall thickness, LA moderately dilated, RA severely dilated - [facility] 2/24 with minimal non-obstructive CAD, NICM EF 30-35%, moderate pulmonary HTN due primarily to OSA/OHS, and relative well-compensated volume status - NYHA III symptoms. Confounded by deconditioning and COPD - Volume status OK on exam.  - Continue lasix 40 mg daily - Continue Entresto 24/26 mg BID.  - Increase spiro to 25 mg daily.  - Continue coreg 3.125 mg BID. SBP 120-130s. Output stable on cath as above.  - This admit mostly secondary to COPD flare but likely component of HF. LHC showed minimal nonobstructive CAD and relatively well compensated filling pressures.   2. Chronic hypoxic respiratory failure - continue home O2.   3. Undiagnosed OSA - Will refer for outpatient sleep study.  - He is using CPAP at facility   4. DM2 - Per PCP/Facility.    - Consider SGLT2i at d/c. No change.   5. AKI on CKD III - Resolved. Cr peaked at 2.28. Creatinine 0.74 on dishcarge. BMET Today.   6. Mild nonobstructive CAD - On LHC 2/24 - Continue ASA 81 mg daily for now - Continue lipitor 40 mg daily  Doing well overall. Will continue med titration and plan repeat Echo in 3-4 months. RTC 4 weeks for further. Sooner with symptoms.   Graciella Freer, PA-C 08/13/18   Greater than 50% of the 25 minute visit was spent in counseling/coordination of care regarding disease state education, salt/fluid restriction, sliding scale diuretics, and medication compliance.

## 2018-08-18 ENCOUNTER — Inpatient Hospital Stay (HOSPITAL_COMMUNITY): Payer: Self-pay

## 2018-08-21 LAB — BLOOD GAS, VENOUS
Acid-Base Excess: 16.7 mmol/L — ABNORMAL HIGH (ref 0.0–2.0)
BICARBONATE: 43.4 mmol/L — AB (ref 20.0–28.0)
O2 Saturation: 28.1 %
Patient temperature: 98.6
pCO2, Ven: 85.7 mmHg (ref 44.0–60.0)
pH, Ven: 7.325 (ref 7.250–7.430)

## 2018-09-08 ENCOUNTER — Other Ambulatory Visit (HOSPITAL_COMMUNITY): Payer: Self-pay | Admitting: Student

## 2018-09-10 ENCOUNTER — Other Ambulatory Visit: Payer: Self-pay

## 2018-09-10 ENCOUNTER — Encounter (HOSPITAL_COMMUNITY): Payer: Self-pay

## 2018-09-10 ENCOUNTER — Ambulatory Visit (HOSPITAL_COMMUNITY)
Admission: RE | Admit: 2018-09-10 | Discharge: 2018-09-10 | Disposition: A | Discharge status: Home / Self Care | Payer: Medicare Other | Source: Ambulatory Visit | Attending: Cardiology | Admitting: Cardiology

## 2018-09-15 ENCOUNTER — Ambulatory Visit (HOSPITAL_COMMUNITY)
Admission: RE | Admit: 2018-09-15 | Discharge: 2018-09-15 | Disposition: A | Discharge status: Home / Self Care | Payer: Medicare Other | Source: Ambulatory Visit | Attending: Cardiology | Admitting: Cardiology

## 2018-09-15 ENCOUNTER — Other Ambulatory Visit: Payer: Self-pay

## 2018-09-15 DIAGNOSIS — J9610 Chronic respiratory failure, unspecified whether with hypoxia or hypercapnia: Secondary | ICD-10-CM

## 2018-09-15 DIAGNOSIS — R0683 Snoring: Secondary | ICD-10-CM

## 2018-09-15 DIAGNOSIS — I2510 Atherosclerotic heart disease of native coronary artery without angina pectoris: Secondary | ICD-10-CM

## 2018-09-15 DIAGNOSIS — I5022 Chronic systolic (congestive) heart failure: Secondary | ICD-10-CM

## 2018-09-15 DIAGNOSIS — I5042 Chronic combined systolic (congestive) and diastolic (congestive) heart failure: Secondary | ICD-10-CM

## 2018-09-15 MED ORDER — FUROSEMIDE 20 MG PO TABS: 20.0000 mg | ORAL_TABLET | Freq: Every day | ORAL | 6 refills | Status: DC

## 2018-09-15 MED ORDER — SPIRONOLACTONE 25 MG PO TABS: 25.0000 mg | ORAL_TABLET | Freq: Every day | ORAL | 6 refills | Status: DC

## 2018-09-15 MED ORDER — CARVEDILOL 3.125 MG PO TABS: 3.1250 mg | ORAL_TABLET | Freq: Two times a day (BID) | ORAL | 6 refills | Status: DC

## 2018-09-15 MED ORDER — ATORVASTATIN CALCIUM 40 MG PO TABS: 40.0000 mg | ORAL_TABLET | Freq: Every day | ORAL | 6 refills | Status: DC

## 2018-09-15 MED ORDER — ASPIRIN 81 MG PO CHEW: 81.0000 mg | CHEWABLE_TABLET | Freq: Every day | ORAL | 6 refills | Status: AC

## 2018-09-15 MED ORDER — SACUBITRIL-VALSARTAN 49-51 MG PO TABS: 1.0000 | ORAL_TABLET | Freq: Two times a day (BID) | ORAL | 6 refills | Status: DC

## 2018-09-15 NOTE — Progress Notes (Signed)
Heart Failure TeleHealth Note  Due to national recommendations of social distancing due to COVID 19, telehealth visit is felt to be most appropriate for this patient at this time.  I discussed the limitations, risks, security and privacy concerns of performing an evaluation and management service by telephone and the availability of in person appointments. I also discussed with the patient that there may be a patient responsible charge related to this service. The patient expressed understanding and agreed to proceed.  ID:  Kristopher [name], Kristopher [name] [birth year], MRN [id number]  Location: Home  Provider location: [address] [place] Type of Visit: Established patient  PCP:  Shirlean Mylar, MD  Cardiologist:  Armanda Magic, MD Primary HF: Dr Gala Romney  Chief Complaint: Chronic Systolic HF   History of Present Illness: Kristopher [name] is a 68 y.o. male with a history of obesity, HTN, DM2, Asthma, Chronic combined diastolic/systolic CHF, and NICM.   Admitted 2/10 - 08/08/2018 with mixed AECOPD and A/C combined CHF. Initially required intubation in the setting of resp failure. Had [facility] as below in the setting of new drop in EF to 25-30%. Medications titrated as tolerated. Continued to require O2 (new for him). He was discharged to SNF with CPAP (also new for him).   He presents via Special educational needs teacher for a telehealth visit today. Last visit spiro was increased. He is back at home for about 2 weeks. He does not have home health and does not need walker. Oxygen 90-96%, now on RA. Does not have CPAP, but has sleep study scheduled. No SOB, he is walking but not very far. Does okay with incline. No edema, orthopnea, or PND. No CP or dizziness. Good UOP with lasix. No cough, fever, or chills. No sick contacts. Taking all medications, but has been taking spiro 50 mg daily and has only been taking coreg once daily. Does not have a scale or BP cuff. Going to the grocery store himself, but limiting to  once/week. Limits salt and fluid intake.   HR: 84-113 Oxygen: 90-96% on RA  Pt denies symptoms of cough, fevers, chills, or new SOB worrisome for COVID 19.    Past Medical History:  Diagnosis Date  . Acute combined systolic and diastolic heart failure (HCC) 08/11/2016  . Acute esophagitis 08/17/2016  . Acute upper GI bleed 08/10/2016  . Asthma   . CAD (coronary artery disease) 08/16/2016  . CHF (congestive heart failure) (HCC)   . DCM (dilated cardiomyopathy) (HCC)   . Dyspnea   . HTN (hypertension)   . LBBB (left bundle branch block) 08/10/2016  . Lymphadenopathy 08/17/2016  . PUD (peptic ulcer disease) 08/17/2016  . Pulmonary HTN (HCC) 08/17/2016   Past Surgical History:  Procedure Laterality Date  . ESOPHAGOGASTRODUODENOSCOPY (EGD) WITH PROPOFOL Left 08/11/2016   Procedure: ESOPHAGOGASTRODUODENOSCOPY (EGD) WITH PROPOFOL;  Surgeon: Willis Modena, MD;  Location: [facility] ENDOSCOPY;  Service: Endoscopy;  Laterality: Left;  . RIGHT/LEFT HEART CATH AND CORONARY ANGIOGRAPHY N/A 08/15/2016   Procedure: Right/Left Heart Cath and Coronary Angiography;  Surgeon: Corky Crafts, MD;  Location: [facility] INVASIVE CV LAB;  Service: Cardiovascular;  Laterality: N/A;  . RIGHT/LEFT HEART CATH AND CORONARY ANGIOGRAPHY N/A 08/04/2018   Procedure: RIGHT/LEFT HEART CATH AND CORONARY ANGIOGRAPHY;  Surgeon: Dolores Patty, MD;  Location: [facility] INVASIVE CV LAB;  Service: Cardiovascular;  Laterality: N/A;     Current Outpatient Medications  Medication Sig Dispense Refill  . acetaminophen (TYLENOL) 325 MG tablet Take 650 mg by mouth  every 6 (six) hours as needed.    Marland Kitchen albuterol (PROVENTIL) (2.5 MG/3ML) 0.083% nebulizer solution Inhale 3 mLs into the lungs every 6 (six) hours as needed for wheezing or shortness of breath.    Marland Kitchen atorvastatin (LIPITOR) 40 MG tablet Take 1 tablet (40 mg total) by mouth daily at 6 PM for 30 days. 30 tablet 0  . budesonide-formoterol (SYMBICORT) 80-4.5 MCG/ACT inhaler Inhale 1 puff into the lungs 2  (two) times daily. 1 Inhaler 12  . carvedilol (COREG) 3.125 MG tablet Take 1 tablet (3.125 mg total) by mouth 2 (two) times daily with a meal for 30 days. 60 tablet 0  . ferrous sulfate 325 (65 FE) MG tablet Take 325 mg by mouth daily with breakfast.    . furosemide (LASIX) 20 MG tablet Take 3 tablets (60 mg total) by mouth daily for 30 days. 90 tablet 0  . ipratropium (ATROVENT) 0.02 % nebulizer solution Take 2.5 mLs (0.5 mg total) by nebulization 2 (two) times daily. 75 mL 12  . pantoprazole (PROTONIX) 40 MG tablet Take 1 tablet (40 mg total) by mouth daily. Please make appt for future refills 1st attempt. 30 tablet 0  . potassium chloride (K-DUR) 10 MEQ tablet Take 2 tablets (20 mEq total) by mouth daily for 30 days. (Patient not taking: Reported on 08/13/2018) 60 tablet 0  . spironolactone (ALDACTONE) 25 MG tablet TAKE 1 TABLET BY MOUTH EVERY DAY, 30 tablet 6   No current facility-administered medications for this encounter.     Allergies:   Penicillins   Social History:  The patient  reports that he has quit smoking. He has never used smokeless tobacco. He reports that he does not drink alcohol or use drugs.   Family History:  The patient's family history includes Hypertension in his mother and sister.   ROS:  Please see the history of present illness.   All other systems are personally reviewed and negative.   Exam:  [facility] Call) Lungs: Normal respiratory effort with conversation.  Neuro: Alert & oriented x 3.   Recent Labs: 07/21/2018: B Natriuretic Peptide 964.4 07/30/2018: ALT 13 08/08/2018: Magnesium 2.0 08/13/2018: BUN 10; Creatinine, Ser 1.11; Hemoglobin 12.0; Platelets 402; Potassium 4.4; Sodium 136  Personally reviewed   Wt Readings from Last 3 Encounters:  08/13/18 108.6 kg (239 lb 6.4 oz)  08/08/18 105.3 kg (232 lb 3.2 oz)  10/16/16 99.3 kg (219 lb)      ASSESSMENT AND PLAN:  1. Chronic systolic/diastolic HF - Echo 01/07/17 EF 91%. Cath with minimal CAD - Echo  07/22/18: EF 25-30%, global HK, RV normal function, increased wall thickness, LA moderately dilated, RA severely dilated - [facility] 2/24 with minimal non-obstructive CAD,NICM EF 30-35%, moderate pulmonary HTN due primarily to OSA/OHS, and relative well-compensated volume status, CI 2.6 - NYHA II-III symptoms. Confounded by deconditioning and COPD - Volume status sounds stable, but difficult to assess.  - Continue lasix 20 mg daily - Continue Entresto 49/51 mg BID. - Continue spiro 25 mg daily. He has been taking double on accident. Will have him decrease back to 25 mg daily.  - Continue coreg 3.125 mg BID. Advised to take BID instead of daily - Echo scheduled for June. If EF remains <35%, will need to consider EP referral for ICD  2. Chronic hypoxic respiratory failure - Back on RA since home from SNF. O2 sats 90-95%.   3. Undiagnosed OSA - Has been referred for outpatient sleep study and is scheduled for later this  month  4. Mild nonobstructive CAD - On LHC 2/24 - Continue ASA 81 mg daily  -Continue lipitor 40 mg daily - No s/s ischemia   COVID screen The patient does not have any symptoms that suggest any further testing/ screening at this time.  Social distancing reinforced today. He is going to the grocery store himself.   Patient Risk: After full review of this patients clinical status, I feel that they are at moderate risk for cardiac decompensation at this time.  No medication changes today since he has been taking meds incorrectly. Will make sure he has refills on HF medications. I will see if [facility] can do a one time visit to check BMET, BP, and medications. Will also see if they are able to get him a scale.  Today, I have spent 26 minutes with the patient with telehealth technology discussing heart failure and medications.  Follow up in 2 weeks by telephone.   Signed, Alford Highland, NP  09/15/2018 8:42 AM  [facility] [address] Heart and  Vascular [place] [address] [phone] (office) [phone] (fax)

## 2018-09-15 NOTE — Addendum Note (Signed)
Encounter addended by: Marisa Hua, RN on: 09/15/2018 1:43 PM  Actions taken: Visit diagnoses modified, Order list changed, Diagnosis association updated, Clinical Note Signed

## 2018-09-15 NOTE — Patient Instructions (Addendum)
Your prescriptions for all of your heart medicines have been refilled  Your physician recommends that you schedule a follow-up appointment in:  A 2 week telehealth visit have been scheduled for April 20th, 2020 at 1:30p.  This visit will be a phone visit.

## 2018-09-15 NOTE — Progress Notes (Signed)
Called and reviewed AVS with patient. Pt scheduled for a nursing visit on Wednesday April 8 at 9am. Pt amenable to plan.  All heart failure medicines have been refilled. Questions answered, verbalized understanding.

## 2018-09-17 ENCOUNTER — Ambulatory Visit (HOSPITAL_COMMUNITY)
Admission: RE | Admit: 2018-09-17 | Discharge: 2018-09-17 | Disposition: A | Discharge status: Home / Self Care | Payer: Medicare Other | Source: Ambulatory Visit | Attending: Cardiology | Admitting: Cardiology

## 2018-09-17 ENCOUNTER — Other Ambulatory Visit: Payer: Self-pay

## 2018-09-17 ENCOUNTER — Encounter (HOSPITAL_COMMUNITY): Payer: Self-pay

## 2018-09-17 DIAGNOSIS — R9431 Abnormal electrocardiogram [ECG] [EKG]: Secondary | ICD-10-CM | POA: Diagnosis not present

## 2018-09-17 DIAGNOSIS — I5042 Chronic combined systolic (congestive) and diastolic (congestive) heart failure: Secondary | ICD-10-CM | POA: Diagnosis present

## 2018-09-17 DIAGNOSIS — I447 Left bundle-branch block, unspecified: Secondary | ICD-10-CM | POA: Diagnosis not present

## 2018-09-17 LAB — BASIC METABOLIC PANEL
Anion gap: 12 (ref 5–15)
BUN: 11 mg/dL (ref 8–23)
CO2: 28 mmol/L (ref 22–32)
Calcium: 9.6 mg/dL (ref 8.9–10.3)
Chloride: 95 mmol/L — ABNORMAL LOW (ref 98–111)
Creatinine, Ser: 1.01 mg/dL (ref 0.61–1.24)
GFR calc Af Amer: >60 mL/min (ref >60)
GFR calc non Af Amer: >60 mL/min (ref >60)
Glucose, Bld: 132 mg/dL — ABNORMAL HIGH (ref 70–99)
Potassium: 4.2 mmol/L (ref 3.5–5.1)
Sodium: 135 mmol/L (ref 135–145)

## 2018-09-17 MED ORDER — CARVEDILOL 6.25 MG PO TABS: 6.2500 mg | ORAL_TABLET | Freq: Two times a day (BID) | ORAL | 3 refills | Status: DC

## 2018-09-17 NOTE — Progress Notes (Signed)
Pt presents today for a bp check, lab work and to receive a scale. The pt has no complaints at this time.  Today's Vitals   09/17/18 0925  BP: (!) 132/92  Pulse: (!) 110  SpO2: 95%  Weight: 230 lb 12.8 oz (104.7 kg)   Body mass index is 37.25 kg/m. EKG done. Shows sinus at 96bpm

## 2018-09-17 NOTE — Patient Instructions (Signed)
Lab work done today. We will notify you of any abnormal lab work.   Blood pressure checked today 132/92. Heart rate elevated a bit at 110.  EKG done today.  INCREASE  Carvedilol 6.25mg  ( 1 tab) two times a day

## 2018-09-29 ENCOUNTER — Other Ambulatory Visit: Payer: Self-pay

## 2018-09-29 ENCOUNTER — Telehealth (HOSPITAL_COMMUNITY): Payer: Self-pay | Admitting: Licensed Clinical Social Worker

## 2018-09-29 ENCOUNTER — Ambulatory Visit (HOSPITAL_COMMUNITY)
Admission: RE | Admit: 2018-09-29 | Discharge: 2018-09-29 | Disposition: A | Discharge status: Home / Self Care | Payer: Medicare Other | Source: Ambulatory Visit | Attending: Adult Health | Admitting: Adult Health

## 2018-09-29 DIAGNOSIS — I5042 Chronic combined systolic (congestive) and diastolic (congestive) heart failure: Secondary | ICD-10-CM | POA: Diagnosis not present

## 2018-09-29 DIAGNOSIS — R0683 Snoring: Secondary | ICD-10-CM

## 2018-09-29 DIAGNOSIS — I2510 Atherosclerotic heart disease of native coronary artery without angina pectoris: Secondary | ICD-10-CM

## 2018-09-29 NOTE — Telephone Encounter (Signed)
CSW consulted to completed paramedicine referral.  CSW called pt to confirm interest in being part of the program and to confirm pt temporary address: [address] in [place], [place]. Marland Kitchen  Per patient he is staying at a friends house following recent illness and increased weakness.  Referral completed and sent in for physician signature.  CSW will continue to follow and assist as needed.  [name], LCSW Clinical Social Worker [facility] Desk#: [phone] Cell#: [phone]

## 2018-09-29 NOTE — Progress Notes (Signed)
Spoke to pt to review after visit summary instructions. Pt verbalized understanding. No further needs at this time.

## 2018-09-29 NOTE — Progress Notes (Signed)
Heart Failure TeleHealth Note  Due to national recommendations of social distancing due to COVID 19, telehealth visit is felt to be most appropriate for this patient at this time.  I discussed the limitations, risks, security and privacy concerns of performing an evaluation and management service by telephone and the availability of in person appointments. I also discussed with the patient that there may be a patient responsible charge related to this service. The patient expressed understanding and agreed to proceed.   ID:  Kristopher [name], Kristopher [name] 18-Apr-1951, MRN [id number]  Location: Home  Provider location: [address] [place] Type of Visit: Established patient   PCP:  Shirlean Mylar, MD  Cardiologist:  Armanda Magic, MD Primary HF: Dr Gala Romney  Chief Complaint: HF follow up   History of Present Illness: Kristopher [name] is a 68 y.o. male with a history of obesity, HTN, DM2, Asthma, Chronic combined diastolic/systolic CHF, and NICM.  Admitted 2/10 - 08/08/2018 with mixed AECOPD and A/C combined CHF. Initially required intubation in the setting of resp failure. Had [facility] as below in the setting of new drop in EF to 25-30%. Medications titrated as tolerated. Continued to require O2 (new for him). He was discharged to SNF with CPAP (also new for him).   Last visit had some med errors that were corrected. He came to clinic for labs, BP check, med verification, and scale. Coreg was increased that day. Creatinine was stable 1.01, K 4.2. BP 132/92, HR 96, weight 230 lbs.   He presents via Special educational needs teacher for a telehealth visit today. Overall doing well. Denies SOB or CP. Trying to stay active. No edema or PND. Had one episode of orthopnea last week, improved with sitting up and inhaler. Appetite and energy level okay. Rare dry cough. No fever or chills. He had been taking Entresto once daily instead of twice daily. He at first told me he is taking lasix 40 mg daily, but then said he is  taking it BID. He had a virtual appointment with Dr Hyman Hopes earlier today and was instructed to take additional 40 mg lasix this morning with 2 lb weight gain. Limiting fluid intake. Baking most everything, eating [place], chicken, and vegetables. Using a pillbox. Staying with a friend in [place] until he gets stronger.  Weight: 230-231 lbs, up to 233 lbs today.  HR: 74 O2: 94-98%  Pt denies symptoms of cough, fevers, chills, or new SOB worrisome for COVID 19.   Past Medical History:  Diagnosis Date  . Acute combined systolic and diastolic heart failure (HCC) 08/11/2016  . Acute esophagitis 08/17/2016  . Acute upper GI bleed 08/10/2016  . Asthma   . CAD (coronary artery disease) 08/16/2016  . CHF (congestive heart failure) (HCC)   . DCM (dilated cardiomyopathy) (HCC)   . Dyspnea   . HTN (hypertension)   . LBBB (left bundle branch block) 08/10/2016  . Lymphadenopathy 08/17/2016  . PUD (peptic ulcer disease) 08/17/2016  . Pulmonary HTN (HCC) 08/17/2016   Past Surgical History:  Procedure Laterality Date  . ESOPHAGOGASTRODUODENOSCOPY (EGD) WITH PROPOFOL Left 08/11/2016   Procedure: ESOPHAGOGASTRODUODENOSCOPY (EGD) WITH PROPOFOL;  Surgeon: Willis Modena, MD;  Location: [facility] ENDOSCOPY;  Service: Endoscopy;  Laterality: Left;  . RIGHT/LEFT HEART CATH AND CORONARY ANGIOGRAPHY N/A 08/15/2016   Procedure: Right/Left Heart Cath and Coronary Angiography;  Surgeon: Corky Crafts, MD;  Location: [facility] INVASIVE CV LAB;  Service: Cardiovascular;  Laterality: N/A;  . RIGHT/LEFT HEART CATH AND CORONARY ANGIOGRAPHY  N/A 08/04/2018   Procedure: RIGHT/LEFT HEART CATH AND CORONARY ANGIOGRAPHY;  Surgeon: Dolores PattyBensimhon, Daniel R, MD;  Location: [facility] INVASIVE CV LAB;  Service: Cardiovascular;  Laterality: N/A;     Current Outpatient Medications  Medication Sig Dispense Refill  . aspirin 81 MG chewable tablet Chew 1 tablet (81 mg total) by mouth daily. 30 tablet 6  . atorvastatin (LIPITOR) 40 MG tablet Take 1 tablet (40 mg total)  by mouth daily at 6 PM for 30 days. 30 tablet 6  . carvedilol (COREG) 6.25 MG tablet Take 1 tablet (6.25 mg total) by mouth 2 (two) times daily with a meal for 30 days. 180 tablet 3  . furosemide (LASIX) 40 MG tablet Take 40 mg by mouth daily.    . potassium chloride (K-DUR,KLOR-CON) 10 MEQ tablet Take 10 mEq by mouth daily.     . sacubitril-valsartan (ENTRESTO) 49-51 MG Take 1 tablet by mouth 2 (two) times daily. 60 tablet 6  . spironolactone (ALDACTONE) 25 MG tablet Take 1 tablet (25 mg total) by mouth daily. 30 tablet 6  . acetaminophen (TYLENOL) 325 MG tablet Take 650 mg by mouth every 6 (six) hours as needed.    Marland Kitchen. albuterol (PROVENTIL) (2.5 MG/3ML) 0.083% nebulizer solution Inhale 3 mLs into the lungs every 6 (six) hours as needed for wheezing or shortness of breath.    . budesonide-formoterol (SYMBICORT) 80-4.5 MCG/ACT inhaler Inhale 1 puff into the lungs 2 (two) times daily. (Patient not taking: Reported on 09/17/2018) 1 Inhaler 12  . ferrous sulfate 325 (65 FE) MG tablet Take 325 mg by mouth daily with breakfast.    . ipratropium (ATROVENT) 0.02 % nebulizer solution Take 2.5 mLs (0.5 mg total) by nebulization 2 (two) times daily. 75 mL 12   No current facility-administered medications for this encounter.     Allergies:   Penicillins   Social History:  The patient  reports that he has quit smoking. He has never used smokeless tobacco. He reports that he does not drink alcohol or use drugs.   Family History:  The patient's family history includes Hypertension in his mother and sister.   ROS:  Please see the history of present illness.   All other systems are personally reviewed and negative.   Exam:  [facility](Tele Health Call) Lungs: Normal respiratory effort with conversation.  Neuro: Alert & oriented x 3.  Denies edema.  Recent Labs: 07/21/2018: B Natriuretic Peptide 964.4 07/30/2018: ALT 13 08/08/2018: Magnesium 2.0 08/13/2018: Hemoglobin 12.0; Platelets 402 09/17/2018: BUN 11; Creatinine,  Ser 1.01; Potassium 4.2; Sodium 135  Personally reviewed   Wt Readings from Last 3 Encounters:  09/17/18 104.7 kg (230 lb 12.8 oz)  08/13/18 108.6 kg (239 lb 6.4 oz)  08/08/18 105.3 kg (232 lb 3.2 oz)      ASSESSMENT AND PLAN:  1. Chronic systolic/diastolic HF - Echo 01/07/17 EF 40%45%. Cath with minimal CAD - Echo 07/22/18: EF 25-30%, global HK, RV normal function, increased wall thickness, LA moderately dilated, RA severely [facility] with minimal non-obstructive CAD,NICM EF 30-35%, moderate pulmonary HTN due primarily to OSA/OHS, and relative well-compensated volume status, CI 2.6 - NYHAII-III symptoms. Confounded by deconditioning and COPD - Volume statussounds okay, possibly mildly elevated. - Continuelasix as he has been taking. Unclear is he is taking 40 mg daily or 40 mg BID. - Continue Entresto 49/51 mg BID.He has been taking this daily instead of of BID. I think volume will be okay if he starts taking Entresto BID. - Continue  spiro 25 mg daily. - Continuecoreg 6.25 mg BID.  - Echo scheduled for June. If EF remains <35%, will need to consider EP referral for ICD - Refer to HF paramedicine with ongoing med errors.   2.Chronichypoxic respiratory failure - Back on RA since home from SNF. O2 sats 90-95%.   3. Undiagnosed OSA - Refer for home sleep study. He is not staying at address listed. He is staying at a friend's house currently at: [address] in [place], [place]  4. Mild nonobstructive CAD - On LHC 2/24 - Continue ASA 81 mg daily  -Continuelipitor 40 mg daily - No s/s ischemia   COVID screen The patient does not have any symptoms that suggest any further testing/ screening at this time.  Social distancing reinforced today.  Patient Risk: After full review of this patients clinical status, I feel that they are at moderate risk for cardiac decompensation at this time.  Orders/Follow up: Refer for home sleep study, Refer to HF  paramedicine, follow up in 4 weeks   Today, I have spent 20 minutes with the patient with telehealth technology discussing the above issues .     Signed, Alford Highland, NP  09/29/2018 1:29 PM   [facility] [address] [facility] [place] [address] [phone] (office) [phone] (fax)

## 2018-09-29 NOTE — Addendum Note (Signed)
Encounter addended by: Nicole Cella, RN on: 09/29/2018 2:19 PM  Actions taken: Clinical Note Signed

## 2018-09-29 NOTE — Addendum Note (Signed)
Encounter addended by: Nicole Cella, RN on: 09/29/2018 2:23 PM  Actions taken: Clinical Note Signed

## 2018-09-29 NOTE — Patient Instructions (Addendum)
You have been referred for an at home sleep study. Once this is set up someone will contact you.  You have been referred to our Heart Failure Paramedicine program. Someone will be in contact with you to set up an appointment.  Please follow up with the Advanced Practice Providers in 4 weeks. This is scheduled for May 19th at 11:30am. This will be a telehealth visit like the one today. Your paramedic should also attend this visit.

## 2018-09-30 NOTE — Addendum Note (Signed)
Encounter addended by: Alford Highland, NP on: 09/30/2018 3:22 PM  Actions taken: Delete clinical note

## 2018-10-01 ENCOUNTER — Telehealth (HOSPITAL_COMMUNITY): Payer: Self-pay

## 2018-10-05 ENCOUNTER — Encounter (HOSPITAL_BASED_OUTPATIENT_CLINIC_OR_DEPARTMENT_OTHER): Payer: Self-pay

## 2018-10-06 ENCOUNTER — Telehealth (HOSPITAL_COMMUNITY): Payer: Self-pay | Admitting: Licensed Clinical Social Worker

## 2018-10-06 NOTE — Telephone Encounter (Signed)
Paramedicine Initial Assessment:  Housing:  In what kind of housing do you live? House/apt/trailer/shelter? [place] and he has not felt safe living there since his health problems escalated back in nov/dec.  Has been temporarily living with a friend during his health concerns so that he could be closer to his health care providers and so that his friend could provide physical assist when needed.  Do you rent/pay a mortgage/own? Owns his home.  Do you live with anyone? Currently living with a friend but usually lives alone.  Are you currently worried about losing your housing? no  Within the past 12 months have you ever stayed outside, in a car, tent, a shelter, or temporarily with someone?no  Within the past 12 months have you been unable to get utilities when it was really needed?no  Social:  What is your current marital status?single  Do you have any children? 2 daughters, 1 son  Do you have family or friends who live locally? All his children live locally.  Food:  Within the past 12 months were you ever worried that food would run out before you got money to buy more? no  Within the past 45months have you run out of food and didn't have money to buy more? no  Income:  What is your current source of income? Social security retirement income  How hard is it for you to pay for the basics like food housing, medical care, and utilities? Not very hard  Do you have outstanding medical bills? no  Insurance:  Are you currently insured? yes  Do you have prescription coverage? yes  Transportation:  Do you have transportation to your medical appointments? Yes   If yes, how? Owns his own car  In the past 12 months has lack of transportation kept you from medical appts or from getting medications? no  In the past 12 months has lack of transportation kept you from meetings, work, or getting things you needed? no   Daily Health  Needs: Do you have a working scale at home? Yes- provided one by clinic previously  How do you manage your medications at home? Pill box  Do you ever take your medications differently than prescribed? no  Do you have issues affording your medications? No  If yes, has this ever prevented you from obtaining medications? N/A  Do you have any concerns with mobility at home? no  Do you use any assistive devices at home or have PCS at home? no   Are there any additional barriers you see to getting the care you need? no  CSW will continue to follow through paramedicine program and assist as needed.  [name], LCSW Clinical Social Worker [facility] Desk#: [phone] Cell#: [phone]

## 2018-10-07 ENCOUNTER — Telehealth (HOSPITAL_COMMUNITY): Payer: Self-pay

## 2018-10-07 NOTE — Telephone Encounter (Signed)
Attempted to reach pt due to recent referral to community paramedicine program. Pt did not answer and I LVM asking him to return my call.   Kerry Hough, EMT-Paramedic  10/07/18

## 2018-10-14 ENCOUNTER — Telehealth (HOSPITAL_COMMUNITY): Payer: Self-pay | Admitting: *Deleted

## 2018-10-14 ENCOUNTER — Telehealth (HOSPITAL_COMMUNITY): Payer: Self-pay | Admitting: Licensed Clinical Social Worker

## 2018-10-14 ENCOUNTER — Other Ambulatory Visit (HOSPITAL_COMMUNITY): Payer: Self-pay

## 2018-10-14 DIAGNOSIS — I5042 Chronic combined systolic (congestive) and diastolic (congestive) heart failure: Secondary | ICD-10-CM

## 2018-10-14 NOTE — Telephone Encounter (Signed)
-----   Message from Alford Highland, NP sent at 10/14/2018  9:55 AM EDT ----- Regarding: Labs Can we check BMET this week in clinic?  He has been taking Entresto and lasix incorrectly. Florentina Addison is out there fixing him up now.  Thanks

## 2018-10-14 NOTE — Telephone Encounter (Signed)
Order for bmet placed, lab sch for tomorrow 5/6

## 2018-10-14 NOTE — Telephone Encounter (Signed)
CSW informed by community paramedic that pt was prescribed a Bipap and had one at SNF but was not able to DC with one.    CSW spoke with pt who confirms that his Bipap was provided by [facility] supply at [facility] but when it came time to leave rehab he was not permitted to take the equipment with him.  States that he was sent a bill for the Bipap by the home agency and informed by them that he was supposed to be able to take home the Bipap.  Per patient this issue is being looked into by the home health agency and by his insurance.  Pt was scheduled for sleep study on 10/29/18- CSW informed pt that usually insurance requires a sleep study prior to approving a home bipap so this still might be a necessary step prior to getting home bipap set up.  Pt has been having issues sleeping and has been waking up in the middle of the night and he believes this is due to not having his bipap.  CSW called the sleep study office and had them push up appointment to 10/25/18 so that he can be seen sooner and hopefully be able to get his bipap sooner.  CSW will continue to follow and assist as needed  [name], LCSW Clinical Social Worker [facility] Desk#: [phone] Cell#: [phone]

## 2018-10-14 NOTE — Progress Notes (Signed)
Paramedicine Encounter    Patient ID: Kristopher [name], male    DOB: 1951/01/15, 68 y.o.   MRN: [id number]   Patient Care Team: Shirlean Mylar, MD as PCP - General (Family Medicine) Quintella Reichert, MD as PCP - Cardiology (Cardiology) Bensimhon, Bevelyn Buckles, MD as PCP - Advanced [facility] (Cardiology) [name], LCSW as Social Worker (Licensed Clinical Social Worker)  Patient Active Problem List   Diagnosis Date Noted  . Acute on chronic combined systolic and diastolic congestive heart failure, NYHA class 4 (HCC) 07/21/2018  . PUD (peptic ulcer disease) 08/17/2016  . Acute esophagitis 08/17/2016  . Pulmonary HTN (HCC) 08/17/2016  . Lymphadenopathy 08/17/2016  . CAD (coronary artery disease) 08/16/2016  . Anemia   . DCM (dilated cardiomyopathy) (HCC)   . HTN (hypertension)   . Chronic combined systolic and diastolic heart failure (HCC) 08/11/2016  . Acute blood loss anemia 08/11/2016  . Acute upper GI bleed 08/10/2016  . DOE (dyspnea on exertion) 08/10/2016  . LBBB (left bundle branch block) 08/10/2016    Current Outpatient Medications:  .  acetaminophen (TYLENOL) 325 MG tablet, Take 650 mg by mouth every 6 (six) hours as needed., Disp: , Rfl:  .  albuterol (PROVENTIL) (2.5 MG/3ML) 0.083% nebulizer solution, Inhale 3 mLs into the lungs every 6 (six) hours as needed for wheezing or shortness of breath., Disp: , Rfl:  .  aspirin 81 MG chewable tablet, Chew 1 tablet (81 mg total) by mouth daily., Disp: 30 tablet, Rfl: 6 .  atorvastatin (LIPITOR) 40 MG tablet, Take 1 tablet (40 mg total) by mouth daily at 6 PM for 30 days., Disp: 30 tablet, Rfl: 6 .  carvedilol (COREG) 6.25 MG tablet, Take 1 tablet (6.25 mg total) by mouth 2 (two) times daily with a meal for 30 days., Disp: 180 tablet, Rfl: 3 .  ferrous sulfate 325 (65 FE) MG tablet, Take 325 mg by mouth daily with breakfast., Disp: , Rfl:  .  furosemide (LASIX) 40 MG tablet, Take 40 mg by mouth 2 (two) times daily. , Disp: , Rfl:  .   ipratropium (ATROVENT) 0.02 % nebulizer solution, Take 2.5 mLs (0.5 mg total) by nebulization 2 (two) times daily., Disp: 75 mL, Rfl: 12 .  omeprazole (PRILOSEC) 40 MG capsule, Take 40 mg by mouth as needed., Disp: , Rfl:  .  potassium chloride (K-DUR,KLOR-CON) 10 MEQ tablet, Take 10 mEq by mouth daily. , Disp: , Rfl:  .  sacubitril-valsartan (ENTRESTO) 49-51 MG, Take 1 tablet by mouth 2 (two) times daily., Disp: 60 tablet, Rfl: 6 .  spironolactone (ALDACTONE) 25 MG tablet, Take 1 tablet (25 mg total) by mouth daily., Disp: 30 tablet, Rfl: 6 .  budesonide-formoterol (SYMBICORT) 80-4.5 MCG/ACT inhaler, Inhale 1 puff into the lungs 2 (two) times daily. (Patient not taking: Reported on 09/17/2018), Disp: 1 Inhaler, Rfl: 12 Allergies  Allergen Reactions  . Penicillins     Pt reports it makes him feel shaky Has patient had a PCN reaction causing immediate rash, facial/tongue/throat swelling, SOB or lightheadedness with hypotension: YES Has patient had a PCN reaction causing severe rash involving mucus membranes or skin necrosis: NO Has patient had a PCN reaction that required hospitalization NO Has patient had a PCN reaction occurring within the last 10 years: NO If all of the above answers are "NO", then may proceed with Cephalosporin use.      Social History   Socioeconomic History  . Marital status: Single    Spouse name:  Not on file  . Number of children: Not on file  . Years of education: Not on file  . Highest education level: Not on file  Occupational History  . Not on file  Social Needs  . Financial resource strain: Not on file  . Food insecurity:    Worry: Not on file    Inability: Not on file  . Transportation needs:    Medical: Not on file    Non-medical: Not on file  Tobacco Use  . Smoking status: Former Games developer  . Smokeless tobacco: Never Used  Substance and Sexual Activity  . Alcohol use: No  . Drug use: No  . Sexual activity: Not on file  Lifestyle  . Physical  activity:    Days per week: Not on file    Minutes per session: Not on file  . Stress: Not on file  Relationships  . Social connections:    Talks on phone: Not on file    Gets together: Not on file    Attends religious service: Not on file    Active member of club or organization: Not on file    Attends meetings of clubs or organizations: Not on file    Relationship status: Not on file  . Intimate partner violence:    Fear of current or ex partner: Not on file    Emotionally abused: Not on file    Physically abused: Not on file    Forced sexual activity: Not on file  Other Topics Concern  . Not on file  Social History Narrative  . Not on file    Physical Exam      Future Appointments  Date Time Provider Department Center  10/28/2018 11:30 AM [facility] PA/NP [facility] None  10/29/2018  8:00 PM [facility] ROOM 4 [facility]  11/13/2018 10:00 AM [facility] ECHO 1-BUZZ [facility]  11/13/2018 11:00 AM Bensimhon, Bevelyn Buckles, MD [facility] None  11/18/2018  9:30 AM Waymon Budge, MD [facility] None    BP 112/68   Pulse 80   Temp 99.7 F (37.6 C)   Resp 16   Wt 232 lb (105.2 kg)   SpO2 96%   BMI 37.45 kg/m   Weight yesterday-233 B/P standing-130/70  First home visit with pt,  Pt denies sob, he has home sp02 and it has been reading 95-97%.  Bi-pap machine was left at [facility]-they said it was theirs to keep and he said his insurance is trying to locate another one?? He has not had it since he left rehab in about 2 months. He does not want the one that was left at rehab due to COVID-19 right now and he isnt sure how to get another one.  Keeps fluid intake to less than 2L. He does not use salt/sodium. He bakes most of his foods, uses frozen veggies. He does buy fresh veggies too.  No issues with his co-pays now.  He is retired.  He needs his inhaler symbicort refilled. He has not had albuterol since he left inhaler-he uses his nebulizer.-uses it daily.   He does use pill  box--he had 2 different doses of entresto-he just mixed the 24/26 in with the 49/51. I confirmed with Morrie Sheldon about his dose of entresto of 49/51 BID and he can still take the lasix BID.  His entresto was corrected in his pill box and everything else was correct.  No edema noted today.   Weight ranging from 229-234-he is weighing daily.  He is not sleeping  too well-waking up "choking" due to him not having his bipap will relay this to jenna to see if she can assist.  He states he washed his car a few days ago without sob. Getting exercise when he can weather permitting.  Will f/u next week.    Kerry Hough, EMT-Paramedic [phone] [facility] Paramedic  10/14/18

## 2018-10-15 ENCOUNTER — Ambulatory Visit (HOSPITAL_COMMUNITY)
Admission: RE | Admit: 2018-10-15 | Discharge: 2018-10-15 | Disposition: A | Discharge status: Home / Self Care | Payer: Medicare Other | Source: Ambulatory Visit | Attending: Internal Medicine | Admitting: Internal Medicine

## 2018-10-15 ENCOUNTER — Other Ambulatory Visit: Payer: Self-pay

## 2018-10-15 DIAGNOSIS — I5042 Chronic combined systolic (congestive) and diastolic (congestive) heart failure: Secondary | ICD-10-CM | POA: Diagnosis present

## 2018-10-15 LAB — BASIC METABOLIC PANEL
Anion gap: 9 (ref 5–15)
BUN: 15 mg/dL (ref 8–23)
CO2: 29 mmol/L (ref 22–32)
Calcium: 9.3 mg/dL (ref 8.9–10.3)
Chloride: 99 mmol/L (ref 98–111)
Creatinine, Ser: 1.34 mg/dL — ABNORMAL HIGH (ref 0.61–1.24)
GFR calc Af Amer: >60 mL/min (ref >60)
GFR calc non Af Amer: 54 mL/min — ABNORMAL LOW (ref >60)
Glucose, Bld: 104 mg/dL — ABNORMAL HIGH (ref 70–99)
Potassium: 4.5 mmol/L (ref 3.5–5.1)
Sodium: 137 mmol/L (ref 135–145)

## 2018-10-17 ENCOUNTER — Telehealth (HOSPITAL_COMMUNITY): Payer: Self-pay

## 2018-10-17 NOTE — Telephone Encounter (Signed)
lmtrc about stop bang questions  

## 2018-10-20 ENCOUNTER — Telehealth (HOSPITAL_COMMUNITY): Payer: Self-pay

## 2018-10-20 NOTE — Telephone Encounter (Addendum)
  Height: 5'6    Weight:232 BMI:37.45 STOP BANG RISK ASSESSMENT S (snore) Have you been told that you snore?     YES   T (tired) Are you often tired, fatigued, or sleepy during the day?   NO  O (obstruction) Do you stop breathing, choke, or gasp during sleep? NO   P (pressure) Do you have or are you being treated for high blood pressure? YES   B (BMI) Is your body index greater than 35 kg/m? YES   A (age) Are you 68 years old or older? YES   N (neck) Do you have a neck circumference greater than 16 inches?   YES/NO   G (gender) Are you a male? YES   TOTAL STOP/BANG "YES" ANSWERS 5                                                                       For Office Use Only              Procedure Order Form    YES to 3+ Stop Bang questions OR two clinical symptoms - patient qualifies for WatchPAT (CPT 95800)     Submit: This Form + Patient Face Sheet + Clinical Note via CloudPAT or Fax: [phone]         Clinical Notes: Will consult Sleep Specialist and refer for management of therapy due to patient increased risk of Sleep Apnea. Ordering a sleep study due to the following two clinical symptoms: Excessive daytime sleepiness G47.10 / Gastroesophageal reflux K21.9 / Nocturia R35.1 / Morning Headaches G44.221 / Difficulty concentrating R41.840 / Memory problems or poor judgment G31.84 / Personality changes or irritability R45.4 / Loud snoring R06.83 / Depression F32.9 / Unrefreshed by sleep G47.8 / Impotence N52.9 / History of high blood pressure R03.0 / Insomnia G47.00

## 2018-10-21 ENCOUNTER — Telehealth (HOSPITAL_COMMUNITY): Payer: Self-pay | Admitting: Surgery

## 2018-10-21 ENCOUNTER — Other Ambulatory Visit (HOSPITAL_COMMUNITY): Payer: Self-pay | Admitting: Surgery

## 2018-10-21 DIAGNOSIS — R0683 Snoring: Secondary | ICD-10-CM

## 2018-10-21 NOTE — Telephone Encounter (Signed)
Order for home sleep study, demographics, Stop Bang and OV Note information sent to [facility] per protocol.

## 2018-10-21 NOTE — Progress Notes (Signed)
Order placed for [facility] home sleep study.

## 2018-10-22 ENCOUNTER — Other Ambulatory Visit (HOSPITAL_COMMUNITY): Payer: Self-pay

## 2018-10-22 NOTE — Progress Notes (Signed)
Paramedicine Encounter    Patient ID: OSA BARTNIK, male    DOB: 1950/10/05, 68 y.o.   MRN: [id number]   Patient Care Team: Shirlean Mylar, MD as PCP - General (Family Medicine) Quintella Reichert, MD as PCP - Cardiology (Cardiology) Bensimhon, Bevelyn Buckles, MD as PCP - Advanced Heart Failure (Cardiology) [name], LCSW as Social Worker (Licensed Clinical Social Worker)  Patient Active Problem List   Diagnosis Date Noted  . Acute on chronic combined systolic and diastolic congestive heart failure, NYHA class 4 (HCC) 07/21/2018  . PUD (peptic ulcer disease) 08/17/2016  . Acute esophagitis 08/17/2016  . Pulmonary HTN (HCC) 08/17/2016  . Lymphadenopathy 08/17/2016  . CAD (coronary artery disease) 08/16/2016  . Anemia   . DCM (dilated cardiomyopathy) (HCC)   . HTN (hypertension)   . Chronic combined systolic and diastolic heart failure (HCC) 08/11/2016  . Acute blood loss anemia 08/11/2016  . Acute upper GI bleed 08/10/2016  . DOE (dyspnea on exertion) 08/10/2016  . LBBB (left bundle branch block) 08/10/2016    Current Outpatient Medications:  .  acetaminophen (TYLENOL) 325 MG tablet, Take 650 mg by mouth every 6 (six) hours as needed., Disp: , Rfl:  .  albuterol (PROVENTIL) (2.5 MG/3ML) 0.083% nebulizer solution, Inhale 3 mLs into the lungs every 6 (six) hours as needed for wheezing or shortness of breath., Disp: , Rfl:  .  aspirin 81 MG chewable tablet, Chew 1 tablet (81 mg total) by mouth daily., Disp: 30 tablet, Rfl: 6 .  ferrous sulfate 325 (65 FE) MG tablet, Take 325 mg by mouth daily with breakfast., Disp: , Rfl:  .  furosemide (LASIX) 40 MG tablet, Take 40 mg by mouth 2 (two) times daily. , Disp: , Rfl:  .  ipratropium (ATROVENT) 0.02 % nebulizer solution, Take 2.5 mLs (0.5 mg total) by nebulization 2 (two) times daily., Disp: 75 mL, Rfl: 12 .  omeprazole (PRILOSEC) 40 MG capsule, Take 40 mg by mouth as needed., Disp: , Rfl:  .  potassium chloride (K-DUR,KLOR-CON) 10 MEQ tablet,  Take 10 mEq by mouth daily. , Disp: , Rfl:  .  sacubitril-valsartan (ENTRESTO) 49-51 MG, Take 1 tablet by mouth 2 (two) times daily., Disp: 60 tablet, Rfl: 6 .  spironolactone (ALDACTONE) 25 MG tablet, Take 1 tablet (25 mg total) by mouth daily., Disp: 30 tablet, Rfl: 6 .  atorvastatin (LIPITOR) 40 MG tablet, Take 1 tablet (40 mg total) by mouth daily at 6 PM for 30 days., Disp: 30 tablet, Rfl: 6 .  budesonide-formoterol (SYMBICORT) 80-4.5 MCG/ACT inhaler, Inhale 1 puff into the lungs 2 (two) times daily. (Patient not taking: Reported on 09/17/2018), Disp: 1 Inhaler, Rfl: 12 .  carvedilol (COREG) 6.25 MG tablet, Take 1 tablet (6.25 mg total) by mouth 2 (two) times daily with a meal for 30 days., Disp: 180 tablet, Rfl: 3 Allergies  Allergen Reactions  . Penicillins     Pt reports it makes him feel shaky Has patient had a PCN reaction causing immediate rash, facial/tongue/throat swelling, SOB or lightheadedness with hypotension: YES Has patient had a PCN reaction causing severe rash involving mucus membranes or skin necrosis: NO Has patient had a PCN reaction that required hospitalization NO Has patient had a PCN reaction occurring within the last 10 years: NO If all of the above answers are "NO", then may proceed with Cephalosporin use.      Social History   Socioeconomic History  . Marital status: Single    Spouse name:  Not on file  . Number of children: Not on file  . Years of education: Not on file  . Highest education level: Not on file  Occupational History  . Not on file  Social Needs  . Financial resource strain: Not on file  . Food insecurity:    Worry: Not on file    Inability: Not on file  . Transportation needs:    Medical: Not on file    Non-medical: Not on file  Tobacco Use  . Smoking status: Former Games developer  . Smokeless tobacco: Never Used  Substance and Sexual Activity  . Alcohol use: No  . Drug use: No  . Sexual activity: Not on file  Lifestyle  . Physical  activity:    Days per week: Not on file    Minutes per session: Not on file  . Stress: Not on file  Relationships  . Social connections:    Talks on phone: Not on file    Gets together: Not on file    Attends religious service: Not on file    Active member of club or organization: Not on file    Attends meetings of clubs or organizations: Not on file    Relationship status: Not on file  . Intimate partner violence:    Fear of current or ex partner: Not on file    Emotionally abused: Not on file    Physically abused: Not on file    Forced sexual activity: Not on file  Other Topics Concern  . Not on file  Social History Narrative  . Not on file    Physical Exam      Future Appointments  Date Time Provider Department Center  10/28/2018 11:30 AM [facility] PA/NP [facility] None  11/13/2018 10:00 AM MC ECHO 1-BUZZ [facility]  11/13/2018 11:00 AM Bensimhon, Bevelyn Buckles, MD [facility] None  11/18/2018  9:30 AM Waymon Budge, MD [facility] None    BP 104/62 (BP Location: Left Arm, Patient Position: Sitting, Cuff Size: Normal)   Pulse 90   Temp (!) 96.8 F (36 C)   Resp 15   Wt 234 lb (106.1 kg)   SpO2 96%   BMI 37.77 kg/m   B/P standing-118/72  Weight yesterday-235 Last visit weight-232  Pt reports feeling good, his weight went up over the wknd--he had a large get together for mothers day and hs grand daughters graduation celebration and ate a lot of foods that day. He denies increased sob, his legs get tired when he walks too much but he doesn't get tired in his chest or gets sob.  He denies feeling sob when his weight increased, it is slowly coming back down. He reports good urine output.  He has sleep study scheduled.  He filled his own pillbox. No mistakes noted. meds verified. Called in his ass for him for refill.  No dizziness, no h/a, no c/p.  He is out of his symbicort so we called that in as well-$47 Left him the number for the extra help program to apply for that as  well. No  Swelling noted, lungs sound slightly diminished to the left side.    Kerry Hough, EMT-Paramedic [phone] [facility] Paramedic  10/23/18

## 2018-10-23 ENCOUNTER — Telehealth (HOSPITAL_COMMUNITY): Payer: Self-pay

## 2018-10-23 DIAGNOSIS — I5022 Chronic systolic (congestive) heart failure: Secondary | ICD-10-CM

## 2018-10-23 NOTE — Telephone Encounter (Signed)
-----   Message from Alford Highland, NP sent at 10/23/2018 10:20 AM EDT ----- Renal function elevated, but in setting of taking entresto incorrectly. Please arrange for repeat BMET next week.

## 2018-10-23 NOTE — Telephone Encounter (Signed)
PT AWARE OF LAB RESULTS, SCHEDULED FOR 5/26 10AM CLOSEST AVAILABLE DATE. PATIENT AMENABLE. APPT CARD MAILED

## 2018-10-25 ENCOUNTER — Encounter (HOSPITAL_BASED_OUTPATIENT_CLINIC_OR_DEPARTMENT_OTHER): Payer: Medicare Other

## 2018-10-28 ENCOUNTER — Telehealth (HOSPITAL_COMMUNITY): Payer: Self-pay

## 2018-10-28 ENCOUNTER — Other Ambulatory Visit (HOSPITAL_COMMUNITY): Payer: Self-pay

## 2018-10-28 ENCOUNTER — Other Ambulatory Visit: Payer: Self-pay

## 2018-10-28 ENCOUNTER — Encounter (HOSPITAL_COMMUNITY): Payer: Self-pay

## 2018-10-28 ENCOUNTER — Ambulatory Visit (HOSPITAL_COMMUNITY)
Admission: RE | Admit: 2018-10-28 | Discharge: 2018-10-28 | Disposition: A | Discharge status: Home / Self Care | Payer: Medicare Other | Source: Ambulatory Visit | Attending: Cardiology | Admitting: Cardiology

## 2018-10-28 VITALS — BP 112/72 | HR 90 | Temp 96.8°F | Wt 233.0 lb

## 2018-10-28 DIAGNOSIS — R0683 Snoring: Secondary | ICD-10-CM

## 2018-10-28 DIAGNOSIS — I2510 Atherosclerotic heart disease of native coronary artery without angina pectoris: Secondary | ICD-10-CM

## 2018-10-28 DIAGNOSIS — I5022 Chronic systolic (congestive) heart failure: Secondary | ICD-10-CM

## 2018-10-28 MED ORDER — CARVEDILOL 6.25 MG PO TABS: 9.3750 mg | ORAL_TABLET | Freq: Two times a day (BID) | ORAL | 0 refills | Status: DC

## 2018-10-28 NOTE — Progress Notes (Signed)
Heart Failure TeleHealth Note  Due to national recommendations of social distancing due to COVID 19, telehealth visit is felt to be most appropriate for this patient at this time.  I discussed the limitations, risks, security and privacy concerns of performing an evaluation and management service by telephone and the availability of in person appointments. I also discussed with the patient that there may be a patient responsible charge related to this service. The patient expressed understanding and agreed to proceed.   ID:  Traysen, Shiers June 24, 1950, MRN [id number]  Location: Home  Provider location: [address], [place] Type of Visit: Established patient   PCP:  Shirlean Mylar, MD  Cardiologist:  Armanda Magic, MD Primary HF: Dr Gala Romney  Chief Complaint: HF follow up   History of Present Illness: ELENO VADEN is a 68 y.o. male with a history of obesity, HTN, DM2, Asthma, Chronic combined diastolic/systolic CHF, and NICM.  Admitted 2/10 - 08/08/2018 with mixed AECOPD and A/C combined CHF. Initially required intubation in the setting of resp failure. Had [facility] as below in the setting of new drop in EF to 25-30%. Medications titrated as tolerated. Continued to require O2 (new for him). He was discharged to SNF with CPAP (also new for him).   Last visit had some med errors that were corrected. He came to clinic for labs, BP check, med verification, and scale. Coreg was increased that day. Creatinine was stable 1.01, K 4.2. BP 132/92, HR 96, weight 230 lbs.   Patient presents via audio conferencing for a telehealth visit today. Last visit he was referred to HF paramedicine due to ongoing med errors and was referred for home sleep study. He had been taking two doses of Entresto and was unclear what dose of lasix he was taking. He is now taking medications correctly. Labs checked and renal function mildly elevated (after taking Entresto incorrectly), repeat ordered for next  week. Overall feeling fine. Denies SOB/PND/Orthopnea. Appetite ok. No fever or chills. Weight at home  231-233 pounds. Taking all medications.     Pt denies symptoms of cough, fevers, chills, or new SOB worrisome for COVID 19.    Past Medical History:  Diagnosis Date  . Acute combined systolic and diastolic heart failure (HCC) 08/11/2016  . Acute esophagitis 08/17/2016  . Acute upper GI bleed 08/10/2016  . Asthma   . CAD (coronary artery disease) 08/16/2016  . CHF (congestive heart failure) (HCC)   . DCM (dilated cardiomyopathy) (HCC)   . Dyspnea   . HTN (hypertension)   . LBBB (left bundle branch block) 08/10/2016  . Lymphadenopathy 08/17/2016  . PUD (peptic ulcer disease) 08/17/2016  . Pulmonary HTN (HCC) 08/17/2016   Past Surgical History:  Procedure Laterality Date  . ESOPHAGOGASTRODUODENOSCOPY (EGD) WITH PROPOFOL Left 08/11/2016   Procedure: ESOPHAGOGASTRODUODENOSCOPY (EGD) WITH PROPOFOL;  Surgeon: Willis Modena, MD;  Location: [facility] ENDOSCOPY;  Service: Endoscopy;  Laterality: Left;  . RIGHT/LEFT HEART CATH AND CORONARY ANGIOGRAPHY N/A 08/15/2016   Procedure: Right/Left Heart Cath and Coronary Angiography;  Surgeon: Corky Crafts, MD;  Location: [facility] INVASIVE CV LAB;  Service: Cardiovascular;  Laterality: N/A;  . RIGHT/LEFT HEART CATH AND CORONARY ANGIOGRAPHY N/A 08/04/2018   Procedure: RIGHT/LEFT HEART CATH AND CORONARY ANGIOGRAPHY;  Surgeon: Dolores Patty, MD;  [facility] INVAS[facility] CV LAB;  Service: Cardiovascular;  Laterality: N/A;     Current Outpatient Medications  Medication Sig Dispense Refill  . acetaminophen (TYLENOL) 325 MG tablet Take 650 mg  by mouth every 6 (six) hours as needed.    Marland Kitchen albuterol (PROVENTIL) (2.5 MG/3ML) 0.083% nebulizer solution Inhale 3 mLs into the lungs every 6 (six) hours as needed for wheezing or shortness of breath.    Marland Kitchen aspirin 81 MG chewable tablet Chew 1 tablet (81 mg total) by mouth daily. 30 tablet 6  . atorvastatin (LIPITOR) 40 MG tablet Take 1  tablet (40 mg total) by mouth daily at 6 PM for 30 days. 30 tablet 6  . budesonide-formoterol (SYMBICORT) 80-4.5 MCG/ACT inhaler Inhale 1 puff into the lungs 2 (two) times daily. 1 Inhaler 12  . carvedilol (COREG) 6.25 MG tablet Take 1 tablet (6.25 mg total) by mouth 2 (two) times daily with a meal for 30 days. 180 tablet 3  . ferrous sulfate 325 (65 FE) MG tablet Take 325 mg by mouth daily with breakfast.    . furosemide (LASIX) 40 MG tablet Take 40 mg by mouth 2 (two) times daily.     Marland Kitchen ipratropium (ATROVENT) 0.02 % nebulizer solution Take 2.5 mLs (0.5 mg total) by nebulization 2 (two) times daily. 75 mL 12  . omeprazole (PRILOSEC) 40 MG capsule Take 40 mg by mouth as needed.    . potassium chloride (K-DUR,KLOR-CON) 10 MEQ tablet Take 10 mEq by mouth daily.     . sacubitril-valsartan (ENTRESTO) 49-51 MG Take 1 tablet by mouth 2 (two) times daily. 60 tablet 6  . spironolactone (ALDACTONE) 25 MG tablet Take 1 tablet (25 mg total) by mouth daily. 30 tablet 6   No current facility-administered medications for this encounter.     Allergies:   Penicillins   Social History:  The patient  reports that he has quit smoking. He has never used smokeless tobacco. He reports that he does not drink alcohol or use drugs.   Family History:  The patient's family history includes Hypertension in his mother and sister.   ROS:  Please see the history of present illness.   All other systems are personally reviewed and negative.    Vitals:   10/28/18 1155  BP: 112/72  Pulse: 90  Temp: (!) 96.8 F (36 C)  SpO2: 95%    Exam:  Video Health Call; Exam is visual. General:  Speaks in full sentences. No resp difficulty. Lungs: Normal respiratory effort with conversation. RLL wheeze Abdomen: No distension per patient report Extremities: Pt denies edema. Neuro: Alert & oriented x 3.   Recent Labs: 07/21/2018: B Natriuretic Peptide 964.4 07/30/2018: ALT 13 08/08/2018: Magnesium 2.0 08/13/2018: Hemoglobin 12.0;  Platelets 402 10/15/2018: BUN 15; Creatinine, Ser 1.34; Potassium 4.5; Sodium 137  Personally reviewed   Wt Readings from Last 3 Encounters:  10/28/18 105.7 kg (233 lb)  10/28/18 105.7 kg (233 lb)  10/22/18 106.1 kg (234 lb)      ASSESSMENT AND PLAN:  1. Chronic systolic/diastolic HF - Echo 01/07/17 EF 16%. Cath with minimal CAD - Echo 07/22/18: EF 25-30%, global HK, RV normal function, increased wall thickness, LA moderately dilated, RA severely dilated - [facility] 2/24 with minimal non-obstructive CAD,NICM EF 30-35%, moderate pulmonary HTN due primarily to OSA/OHS, and relative well-compensated volume status, CI 2.6 - NYHAII-III symptoms. Confounded by deconditioning and COPD - Volume status stable. Continuelasix40 mg BID. - Continue Entresto49/51mg  BID. - Continue spiro 25 mg daily. - Increase coreg to 9.375 mg twice a day. Discussed medication change with HF Paramedicine.  - Echo scheduled for June. If EF remains <35%, will need to consider EP referral for ICD - Followed  by HF paramedicine.   2.Chronichypoxic respiratory failure -Back on RAsince home from SNF.   3. Undiagnosed OSA - Referred for home sleep study. - Sleep study at [facility] cancelled.   4. Mild nonobstructive CAD - On LHC 2/24 - Continue ASA 81 mg daily  -Continuelipitor 40 mg daily - No chest pain.    COVID screen The patient does not have any symptoms that suggest any further testing/ screening at this time.  Social distancing reinforced today.  Patient Risk: After full review of this patients clinical status, I feel that they are at moderate risk for cardiac decompensation at this time.  Orders/Follow up: Increase carvedilol to 9.375 mg twice a day.  Has f/u 6/4 with Dr Leory Plowman. He will get an ECHO at that time.    Today, I have spent 15 minutes with the patient with telehealth technology discussing the above.     Waneta Martins, NP  10/28/2018 11:57 AM   [facility] [address] Heart [facility] Vascular [place] [address] [phone] (office) [phone] (fax)

## 2018-10-28 NOTE — Progress Notes (Signed)
Paramedicine Encounter    Patient ID: Kristopher [name], male    DOB: 03/14/1951, 68 y.o.   MRN: [id number]   Patient Care Team: Shirlean Mylar, MD as PCP - General (Family Medicine) Quintella Reichert, MD as PCP - Cardiology (Cardiology) Bensimhon, Bevelyn Buckles, MD as PCP - Advanced Heart Failure (Cardiology) [name], LCSW as Social Worker (Licensed Clinical Social Worker)  Patient Active Problem List   Diagnosis Date Noted  . Acute on chronic combined systolic and diastolic congestive heart failure, NYHA class 4 (HCC) 07/21/2018  . PUD (peptic ulcer disease) 08/17/2016  . Acute esophagitis 08/17/2016  . Pulmonary HTN (HCC) 08/17/2016  . Lymphadenopathy 08/17/2016  . CAD (coronary artery disease) 08/16/2016  . Anemia   . DCM (dilated cardiomyopathy) (HCC)   . HTN (hypertension)   . Chronic combined systolic and diastolic heart failure (HCC) 08/11/2016  . Acute blood loss anemia 08/11/2016  . Acute upper GI bleed 08/10/2016  . DOE (dyspnea on exertion) 08/10/2016  . LBBB (left bundle branch block) 08/10/2016    Current Outpatient Medications:  .  acetaminophen (TYLENOL) 325 MG tablet, Take 650 mg by mouth every 6 (six) hours as needed., Disp: , Rfl:  .  albuterol (PROVENTIL) (2.5 MG/3ML) 0.083% nebulizer solution, Inhale 3 mLs into the lungs every 6 (six) hours as needed for wheezing or shortness of breath., Disp: , Rfl:  .  aspirin 81 MG chewable tablet, Chew 1 tablet (81 mg total) by mouth daily., Disp: 30 tablet, Rfl: 6 .  budesonide-formoterol (SYMBICORT) 80-4.5 MCG/ACT inhaler, Inhale 1 puff into the lungs 2 (two) times daily., Disp: 1 Inhaler, Rfl: 12 .  ferrous sulfate 325 (65 FE) MG tablet, Take 325 mg by mouth daily with breakfast., Disp: , Rfl:  .  furosemide (LASIX) 40 MG tablet, Take 40 mg by mouth 2 (two) times daily. , Disp: , Rfl:  .  ipratropium (ATROVENT) 0.02 % nebulizer solution, Take 2.5 mLs (0.5 mg total) by nebulization 2 (two) times daily., Disp: 75 mL, Rfl: 12 .   omeprazole (PRILOSEC) 40 MG capsule, Take 40 mg by mouth as needed., Disp: , Rfl:  .  potassium chloride (K-DUR,KLOR-CON) 10 MEQ tablet, Take 10 mEq by mouth daily. , Disp: , Rfl:  .  sacubitril-valsartan (ENTRESTO) 49-51 MG, Take 1 tablet by mouth 2 (two) times daily., Disp: 60 tablet, Rfl: 6 .  spironolactone (ALDACTONE) 25 MG tablet, Take 1 tablet (25 mg total) by mouth daily., Disp: 30 tablet, Rfl: 6 .  atorvastatin (LIPITOR) 40 MG tablet, Take 1 tablet (40 mg total) by mouth daily at 6 PM for 30 days., Disp: 30 tablet, Rfl: 6 .  carvedilol (COREG) 6.25 MG tablet, Take 1.5 tablets (9.375 mg total) by mouth 2 (two) times daily with a meal for 30 days., Disp: 270 tablet, Rfl: 0 Allergies  Allergen Reactions  . Penicillins     Pt reports it makes him feel shaky Has patient had a PCN reaction causing immediate rash, facial/tongue/throat swelling, SOB or lightheadedness with hypotension: YES Has patient had a PCN reaction causing severe rash involving mucus membranes or skin necrosis: NO Has patient had a PCN reaction that required hospitalization NO Has patient had a PCN reaction occurring within the last 10 years: NO If all of the above answers are "NO", then may proceed with Cephalosporin use.      Social History   Socioeconomic History  . Marital status: Single    Spouse name: Not on file  . Number  of children: Not on file  . Years of education: Not on file  . Highest education level: Not on file  Occupational History  . Not on file  Social Needs  . Financial resource strain: Not on file  . Food insecurity:    Worry: Not on file    Inability: Not on file  . Transportation needs:    Medical: Not on file    Non-medical: Not on file  Tobacco Use  . Smoking status: Former Games developer  . Smokeless tobacco: Never Used  Substance and Sexual Activity  . Alcohol use: No  . Drug use: No  . Sexual activity: Not on file  Lifestyle  . Physical activity:    Days per week: Not on file     Minutes per session: Not on file  . Stress: Not on file  Relationships  . Social connections:    Talks on phone: Not on file    Gets together: Not on file    Attends religious service: Not on file    Active member of club or organization: Not on file    Attends meetings of clubs or organizations: Not on file    Relationship status: Not on file  . Intimate partner violence:    Fear of current or ex partner: Not on file    Emotionally abused: Not on file    Physically abused: Not on file    Forced sexual activity: Not on file  Other Topics Concern  . Not on file  Social History Narrative  . Not on file    Physical Exam      Future Appointments  Date Time Provider Department Center  11/04/2018 10:00 AM [facility] LAB [facility] None  11/13/2018 10:00 AM MC ECHO OP 1 [facility]  11/13/2018 11:00 AM Bensimhon, Bevelyn Buckles, MD [facility] None  11/18/2018  9:30 AM Waymon Budge, MD [facility]PULCARE None    BP 112/72   Pulse 90   Temp (!) 96.8 F (36 C)   Resp 18   Wt 233 lb (105.7 kg)   SpO2 95%   BMI 37.61 kg/m  B/p standing-122/74 Weight yesterday-233 Last visit weight-234  Pt reports feeling good, according to epic his sleep study has been cancelled--will ask provider about that when we do televisit- He filled his own pill box, he had an extra lasix in one dose and then he missed a carvedilol in one dose--that was corrected-he says he dropped a couple of them and he got flustered.  Pt denies sob, no dizziness, he has a wee bit of pitting edema to his ankle area. Other than that, his lungs sound ok-he has a tiny wheeze to the rt side, he uses his inhaler as prescribed and used his neb this morning.  He did find his at home sleep study that was delivered, however he doesn't have a smart phone but his roommate does and hopefully he will be able to use that to get it done correctly.   Kerry Hough, EMT-Paramedic [phone] [facility] Paramedic  10/28/18

## 2018-10-28 NOTE — Patient Instructions (Addendum)
INCREASE your Carvedilol (Coreg) to 9.375 mg (1.5 tablets) twice a day. This prescription has been sent to your pharmacy.    Your physician has requested that you have an echocardiogram. Echocardiography is a painless test that uses sound waves to create images of your heart. It provides your doctor with information about the size and shape of your heart and how well your heart's chambers and valves are working. This procedure takes approximately one hour. There are no restrictions for this procedure.  Please keep your follow up appointment on June 4th for your ECHO at 10:00am and your follow up visit with Dr. Gala Romney at 11:00am.  Call the office at [phone], option 2 for the nurses, with any questions or concerns.

## 2018-10-28 NOTE — Telephone Encounter (Signed)
AVS mailed:  Increase Coreg to 9.375  F/u DB with ECHO on 6/4

## 2018-10-28 NOTE — Addendum Note (Signed)
Encounter addended by: Shea Stakes, RN on: 10/28/2018 1:53 PM  Actions taken: Order list changed, Clinical Note Signed

## 2018-10-29 ENCOUNTER — Encounter (HOSPITAL_BASED_OUTPATIENT_CLINIC_OR_DEPARTMENT_OTHER): Payer: Self-pay

## 2018-10-29 ENCOUNTER — Encounter (HOSPITAL_BASED_OUTPATIENT_CLINIC_OR_DEPARTMENT_OTHER): Payer: Medicare Other

## 2018-10-30 ENCOUNTER — Telehealth (HOSPITAL_COMMUNITY): Payer: Self-pay | Admitting: Licensed Clinical Social Worker

## 2018-10-30 ENCOUNTER — Ambulatory Visit (INDEPENDENT_AMBULATORY_CARE_PROVIDER_SITE_OTHER): Payer: Medicare Other

## 2018-10-30 DIAGNOSIS — G4733 Obstructive sleep apnea (adult) (pediatric): Secondary | ICD-10-CM | POA: Diagnosis not present

## 2018-10-30 DIAGNOSIS — R0683 Snoring: Secondary | ICD-10-CM

## 2018-10-30 NOTE — Telephone Encounter (Signed)
CSW informed by community paramedic that pt has had his home sleep study but now he is unsure what to do with his equipment.  CSW called Pharmacist, hospital who informed CSW that if pt was sent a WatchPAT-ONE that this is a disposable model but all other models would have been sent with a return label and would need to be returned.  They also suggested pt keep his equipment for 5 days to make sure there is nothing wrong with his results before throwing equipment away.  CSW called pt and updated- pt confirmed he has the disposable model and will plan to throw it away after he confirms there were no issues with his results.  CSW will continue to follow and assist as needed  [name], LCSW Clinical Social Worker [facility] Desk#: [phone] Cell#: [phone]

## 2018-10-31 ENCOUNTER — Other Ambulatory Visit: Payer: Self-pay

## 2018-11-04 ENCOUNTER — Other Ambulatory Visit (HOSPITAL_COMMUNITY): Payer: Self-pay

## 2018-11-04 ENCOUNTER — Ambulatory Visit (HOSPITAL_COMMUNITY)
Admission: RE | Admit: 2018-11-04 | Discharge: 2018-11-04 | Disposition: A | Discharge status: Home / Self Care | Payer: Medicare Other | Source: Ambulatory Visit | Attending: Cardiology | Admitting: Cardiology

## 2018-11-04 ENCOUNTER — Other Ambulatory Visit: Payer: Self-pay

## 2018-11-04 DIAGNOSIS — I5022 Chronic systolic (congestive) heart failure: Secondary | ICD-10-CM | POA: Insufficient documentation

## 2018-11-04 LAB — BASIC METABOLIC PANEL
Anion gap: 9 (ref 5–15)
BUN: 21 mg/dL (ref 8–23)
CO2: 33 mmol/L — ABNORMAL HIGH (ref 22–32)
Calcium: 9.6 mg/dL (ref 8.9–10.3)
Chloride: 98 mmol/L (ref 98–111)
Creatinine, Ser: 1.21 mg/dL (ref 0.61–1.24)
GFR calc Af Amer: >60 mL/min (ref >60)
GFR calc non Af Amer: >60 mL/min (ref >60)
Glucose, Bld: 100 mg/dL — ABNORMAL HIGH (ref 70–99)
Potassium: 4.3 mmol/L (ref 3.5–5.1)
Sodium: 140 mmol/L (ref 135–145)

## 2018-11-04 NOTE — Progress Notes (Signed)
Paramedicine Encounter    Patient ID: Kristopher [name], male    DOB: [birth year], 68 y.o.   MRN: [id number]   Patient Care Team: Shirlean Mylar, MD as PCP - General (Family Medicine) Quintella Reichert, MD as PCP - Cardiology (Cardiology) Bensimhon, Bevelyn Buckles, MD as PCP - Advanced Heart Failure (Cardiology) [name], LCSW as Social Worker (Licensed Clinical Social Worker)  Patient Active Problem List   Diagnosis Date Noted  . Acute on chronic combined systolic and diastolic congestive heart failure, NYHA class 4 (HCC) 07/21/2018  . PUD (peptic ulcer disease) 08/17/2016  . Acute esophagitis 08/17/2016  . Pulmonary HTN (HCC) 08/17/2016  . Lymphadenopathy 08/17/2016  . CAD (coronary artery disease) 08/16/2016  . Anemia   . DCM (dilated cardiomyopathy) (HCC)   . HTN (hypertension)   . Chronic combined systolic and diastolic heart failure (HCC) 08/11/2016  . Acute blood loss anemia 08/11/2016  . Acute upper GI bleed 08/10/2016  . DOE (dyspnea on exertion) 08/10/2016  . LBBB (left bundle branch block) 08/10/2016    Current Outpatient Medications:  .  acetaminophen (TYLENOL) 325 MG tablet, Take 650 mg by mouth every 6 (six) hours as needed., Disp: , Rfl:  .  albuterol (PROVENTIL) (2.5 MG/3ML) 0.083% nebulizer solution, Inhale 3 mLs into the lungs every 6 (six) hours as needed for wheezing or shortness of breath., Disp: , Rfl:  .  aspirin 81 MG chewable tablet, Chew 1 tablet (81 mg total) by mouth daily., Disp: 30 tablet, Rfl: 6 .  budesonide-formoterol (SYMBICORT) 80-4.5 MCG/ACT inhaler, Inhale 1 puff into the lungs 2 (two) times daily., Disp: 1 Inhaler, Rfl: 12 .  carvedilol (COREG) 6.25 MG tablet, Take 1.5 tablets (9.375 mg total) by mouth 2 (two) times daily with a meal for 30 days., Disp: 270 tablet, Rfl: 0 .  ferrous sulfate 325 (65 FE) MG tablet, Take 325 mg by mouth daily with breakfast., Disp: , Rfl:  .  furosemide (LASIX) 40 MG tablet, Take 40 mg by mouth 2 (two) times daily. , Disp: ,  Rfl:  .  ipratropium (ATROVENT) 0.02 % nebulizer solution, Take 2.5 mLs (0.5 mg total) by nebulization 2 (two) times daily., Disp: 75 mL, Rfl: 12 .  omeprazole (PRILOSEC) 40 MG capsule, Take 40 mg by mouth as needed., Disp: , Rfl:  .  potassium chloride (K-DUR,KLOR-CON) 10 MEQ tablet, Take 10 mEq by mouth daily. , Disp: , Rfl:  .  sacubitril-valsartan (ENTRESTO) 49-51 MG, Take 1 tablet by mouth 2 (two) times daily., Disp: 60 tablet, Rfl: 6 .  spironolactone (ALDACTONE) 25 MG tablet, Take 1 tablet (25 mg total) by mouth daily., Disp: 30 tablet, Rfl: 6 .  atorvastatin (LIPITOR) 40 MG tablet, Take 1 tablet (40 mg total) by mouth daily at 6 PM for 30 days., Disp: 30 tablet, Rfl: 6 Allergies  Allergen Reactions  . Penicillins     Pt reports it makes him feel shaky Has patient had a PCN reaction causing immediate rash, facial/tongue/throat swelling, SOB or lightheadedness with hypotension: YES Has patient had a PCN reaction causing severe rash involving mucus membranes or skin necrosis: NO Has patient had a PCN reaction that required hospitalization NO Has patient had a PCN reaction occurring within the last 10 years: NO If all of the above answers are "NO", then may proceed with Cephalosporin use.      Social History   Socioeconomic History  . Marital status: Single    Spouse name: Not on file  . Number  of children: Not on file  . Years of education: Not on file  . Highest education level: Not on file  Occupational History  . Not on file  Social Needs  . Financial resource strain: Not on file  . Food insecurity:    Worry: Not on file    Inability: Not on file  . Transportation needs:    Medical: Not on file    Non-medical: Not on file  Tobacco Use  . Smoking status: Former Games developer  . Smokeless tobacco: Never Used  Substance and Sexual Activity  . Alcohol use: No  . Drug use: No  . Sexual activity: Not on file  Lifestyle  . Physical activity:    Days per week: Not on file     Minutes per session: Not on file  . Stress: Not on file  Relationships  . Social connections:    Talks on phone: Not on file    Gets together: Not on file    Attends religious service: Not on file    Active member of club or organization: Not on file    Attends meetings of clubs or organizations: Not on file    Relationship status: Not on file  . Intimate partner violence:    Fear of current or ex partner: Not on file    Emotionally abused: Not on file    Physically abused: Not on file    Forced sexual activity: Not on file  Other Topics Concern  . Not on file  Social History Narrative  . Not on file    Physical Exam      Future Appointments  Date Time Provider Department Center  11/04/2018 10:00 AM [facility] LAB [facility] None  11/13/2018 10:00 AM MC ECHO OP 1 [facility]CHOL[facility]  11/13/2018 11:00 AM Bensimhon, Bevelyn Buckles, MD [facility] None  11/18/2018  9:30 AM Waymon Budge, MD L[facility]PULCARE None    BP (!) 80/60   Pulse 82   Temp (!) 97.3 F (36.3 C)   Resp 15   Wt 237 lb (107.5 kg)   SpO2 95%   BMI 38.25 kg/m  B/p standing-110/systolic  Weight [id number] Last visit weight-233  Pt reports he is feeling good, he done sleep study last week, he is anxious to hear his results-I advised him he could call the office to get further instructions. Pt denies increased sob. His weight is increasing. Yesterday he splurged and ate at a cookout but his weight was increasing from that.  He states he has been eating more breading on his baked fish, he doesn't have the box to read the label to see how much sodium is in there.  V/s as noted-he denies any dizziness at any time. No c/p,  His abd is a little tighter than normal.  No c/p noted.  Spoke with amy and no changes right now and continue to monitor.   Kerry Hough, EMT-Paramedic [phone] [facility] Paramedic  11/04/18

## 2018-11-06 ENCOUNTER — Telehealth: Payer: Self-pay | Admitting: *Deleted

## 2018-11-06 DIAGNOSIS — G4733 Obstructive sleep apnea (adult) (pediatric): Secondary | ICD-10-CM

## 2018-11-06 NOTE — Telephone Encounter (Signed)
-----   Message from Quintella Reichert, MD sent at 10/31/2018  9:07 AM EDT ----- Please let patient know that they have sleep apnea and recommend starting ResMed CPAP with heated humidity and mask of choice throught [facility].  Please set on auto from 6 to 20cm H2O.  Needs followup with me in 10 weeks after he gets his device.

## 2018-11-06 NOTE — Telephone Encounter (Addendum)
Order placed to [facility] Informed patient of sleep study results and patient understanding was verbalized. DME selection is [facility]. Patient understands he will be contacted by [facility] to set up his cpap. Patient understands to call if [facility] does not contact him with new setup in a timely manner. Patient understands they will be called once confirmation has been received from [facility] that they have received their new machine to schedule 10 week follow up appointment.  [facility] notified of new cpap order  Please add to airview Patient was grateful for the call and thanked me.

## 2018-11-13 ENCOUNTER — Ambulatory Visit (HOSPITAL_COMMUNITY)
Admission: RE | Admit: 2018-11-13 | Discharge: 2018-11-13 | Disposition: A | Discharge status: Home / Self Care | Payer: Medicare Other | Source: Ambulatory Visit | Attending: Internal Medicine | Admitting: Internal Medicine

## 2018-11-13 ENCOUNTER — Other Ambulatory Visit: Payer: Self-pay

## 2018-11-13 ENCOUNTER — Encounter (HOSPITAL_COMMUNITY): Payer: Self-pay | Admitting: Internal Medicine

## 2018-11-13 ENCOUNTER — Ambulatory Visit (HOSPITAL_BASED_OUTPATIENT_CLINIC_OR_DEPARTMENT_OTHER)
Admission: RE | Admit: 2018-11-13 | Discharge: 2018-11-13 | Disposition: A | Discharge status: Home / Self Care | Payer: Medicare Other | Source: Ambulatory Visit | Attending: Internal Medicine | Admitting: Internal Medicine

## 2018-11-13 VITALS — BP 112/72 | HR 85 | Wt 239.8 lb

## 2018-11-13 DIAGNOSIS — I5042 Chronic combined systolic (congestive) and diastolic (congestive) heart failure: Secondary | ICD-10-CM | POA: Diagnosis not present

## 2018-11-13 DIAGNOSIS — I447 Left bundle-branch block, unspecified: Secondary | ICD-10-CM | POA: Diagnosis not present

## 2018-11-13 DIAGNOSIS — I2510 Atherosclerotic heart disease of native coronary artery without angina pectoris: Secondary | ICD-10-CM | POA: Diagnosis not present

## 2018-11-13 DIAGNOSIS — I351 Nonrheumatic aortic (valve) insufficiency: Secondary | ICD-10-CM | POA: Insufficient documentation

## 2018-11-13 DIAGNOSIS — G4733 Obstructive sleep apnea (adult) (pediatric): Secondary | ICD-10-CM

## 2018-11-13 DIAGNOSIS — Z8249 Family history of ischemic heart disease and other diseases of the circulatory system: Secondary | ICD-10-CM | POA: Diagnosis not present

## 2018-11-13 DIAGNOSIS — I272 Pulmonary hypertension, unspecified: Secondary | ICD-10-CM | POA: Insufficient documentation

## 2018-11-13 DIAGNOSIS — Z79899 Other long term (current) drug therapy: Secondary | ICD-10-CM | POA: Diagnosis not present

## 2018-11-13 DIAGNOSIS — I5022 Chronic systolic (congestive) heart failure: Secondary | ICD-10-CM | POA: Diagnosis not present

## 2018-11-13 DIAGNOSIS — J441 Chronic obstructive pulmonary disease with (acute) exacerbation: Secondary | ICD-10-CM | POA: Diagnosis not present

## 2018-11-13 DIAGNOSIS — I420 Dilated cardiomyopathy: Secondary | ICD-10-CM | POA: Insufficient documentation

## 2018-11-13 DIAGNOSIS — I110 Hypertensive heart disease with heart failure: Secondary | ICD-10-CM | POA: Diagnosis not present

## 2018-11-13 DIAGNOSIS — Z87891 Personal history of nicotine dependence: Secondary | ICD-10-CM | POA: Diagnosis not present

## 2018-11-13 DIAGNOSIS — Z7982 Long term (current) use of aspirin: Secondary | ICD-10-CM | POA: Diagnosis not present

## 2018-11-13 LAB — BASIC METABOLIC PANEL
Anion gap: 10 (ref 5–15)
BUN: 12 mg/dL (ref 8–23)
CO2: 28 mmol/L (ref 22–32)
Calcium: 9.4 mg/dL (ref 8.9–10.3)
Chloride: 100 mmol/L (ref 98–111)
Creatinine, Ser: 1.18 mg/dL (ref 0.61–1.24)
GFR calc Af Amer: >60 mL/min (ref >60)
GFR calc non Af Amer: >60 mL/min (ref >60)
Glucose, Bld: 91 mg/dL (ref 70–99)
Potassium: 4.7 mmol/L (ref 3.5–5.1)
Sodium: 138 mmol/L (ref 135–145)

## 2018-11-13 LAB — BRAIN NATRIURETIC PEPTIDE: B Natriuretic Peptide: 58.8 pg/mL (ref 0.0–100.0)

## 2018-11-13 MED ORDER — SACUBITRIL-VALSARTAN 97-103 MG PO TABS: 1.0000 | ORAL_TABLET | Freq: Two times a day (BID) | ORAL | 6 refills | Status: DC

## 2018-11-13 MED ORDER — PERFLUTREN LIPID MICROSPHERE
1.0000 mL | INTRAVENOUS | Status: AC | PRN
  Administered 2018-11-13: 3 mL via INTRAVENOUS
  Filled 2018-11-13: qty 10

## 2018-11-13 NOTE — Progress Notes (Signed)
  Echocardiogram 2D Echocardiogram has been performed.  Kristopher [name] 11/13/2018, 11:13 AM

## 2018-11-13 NOTE — Patient Instructions (Addendum)
Lab work done today. We will notify you of any abnormal lab work. No news is good news!!  INCREASE Entresto to 97/103mg  (1 tab) two times daily.  You have been referred to Cardiac Electrophysiology. They will contact you in order to make an appointment.  Please follow up with Dr. Gala Romney in 2 months.

## 2018-11-13 NOTE — Progress Notes (Signed)
Advanced HF Clinic Note ID:  Kristopher [name], Kristopher [name] 08, 1952, MRN [id number]  Location: Home  Provider location: [address], [place] Type of Visit: Established patient   PCP:  Shirlean Mylar, MD  Cardiologist:  Armanda Magic, MD Primary HF: Dr Gala Romney  Chief Complaint: HF follow up   History of Present Illness: Kristopher [name] is a 68 y.o. male with a history of obesity, HTN, DM2, Asthma, Chronic combined diastolic/systolic CHF, severe OSA (AHI 65) and NICM.  Admitted 2/10 - 08/08/2018 with mixed AECOPD and A/C combined CHF. Initially required intubation in the setting of resp failure. Had [facility] as below in the setting of new drop in EF to 25-30%. Medications titrated as tolerated. Continued to require O2 (new for him). He was discharged to SNF with CPAP (also new for him). D/c weight 235 pounds  Last visit had some med errors that were corrected. He came to clinic for labs, BP check, med verification, and scale. Coreg was increased that day. Creatinine was stable 1.01, K 4.2. BP 132/92, HR 96, weight 230 lbs.   Patient presents for routine f/u  Last visit he was referred to HF paramedicine due to ongoing med errors and was referred for home sleep study. Sleep study 10/31/18 with severe OSA (AHI 65)  Echo today ~ 25-30%  Says he feels much better. Has Paramedicine coming to see him. Able to get around and do all ADLs. Trying to get CPAP but fighting with his insurance company. Weight stable 231-235. Taking lasix 40 bid. No edema, orthopnea or PND. No dizziness.    Cath 2/20   Prox LAD lesion is 30% stenosed.  Prox RCA to Mid RCA lesion is 30% stenosed.  Prox Cx lesion is 30% stenosed.   Findings:  Ao = 107/69 (87)  LV =  119/13 RA =  5 RV = 64/11 PA = 71/23 (39) PCW = 21 Fick cardiac output/index = 5.6/2.6 PVR = 3.1 WU Ao sat = 94% PA sat = 64%, 65%  Assessment: 1. Minimal non-obstructive CAD 2. NICM EF 30-35% 3. Moderate pulmonary HTN due primarily  to OSA/OHS     Pt denies symptoms of cough, fevers, chills, or new SOB worrisome for COVID 19.    Past Medical History:  Diagnosis Date  . Acute combined systolic and diastolic heart failure (HCC) 08/11/2016  . Acute esophagitis 08/17/2016  . Acute upper GI bleed 08/10/2016  . Asthma   . CAD (coronary artery disease) 08/16/2016  . CHF (congestive heart failure) (HCC)   . DCM (dilated cardiomyopathy) (HCC)   . Dyspnea   . HTN (hypertension)   . LBBB (left bundle branch block) 08/10/2016  . Lymphadenopathy 08/17/2016  . PUD (peptic ulcer disease) 08/17/2016  . Pulmonary HTN (HCC) 08/17/2016   Past Surgical History:  Procedure Laterality Date  . ESOPHAGOGASTRODUODENOSCOPY (EGD) WITH PROPOFOL Left 08/11/2016   Procedure: ESOPHAGOGASTRODUODENOSCOPY (EGD) WITH PROPOFOL;  Surgeon: Willis Modena, MD;  Location: [facility] ENDOSCOPY;  Service: Endoscopy;  Laterality: Left;  . RIGHT/LEFT HEART CATH AND CORONARY ANGIOGRAPHY N/A 08/15/2016   Procedure: Right/Left Heart Cath and Coronary Angiography;  Surgeon: Corky Crafts, MD;  Location: [facility] INVASIVE CV LAB;  Service: Cardiovascular;  Laterality: N/A;  . RIGHT/LEFT HEART CATH AND CORONARY ANGIOGRAPHY N/A 08/04/2018   Procedure: RIGHT/LEFT HEART CATH AND CORONARY ANGIOGRAPHY;  Surgeon: Dolores Patty, MD;  [facility] INV[facility] CV LAB;  Service: Cardiovascular;  Laterality: N/A;     Current Outpatient Medications  Medication  Sig Dispense Refill  . acetaminophen (TYLENOL) 325 MG tablet Take 650 mg by mouth every 6 (six) hours as needed.    Marland Kitchen albuterol (PROVENTIL) (2.5 MG/3ML) 0.083% nebulizer solution Inhale 3 mLs into the lungs every 6 (six) hours as needed for wheezing or shortness of breath.    Marland Kitchen aspirin 81 MG chewable tablet Chew 1 tablet (81 mg total) by mouth daily. 30 tablet 6  . atorvastatin (LIPITOR) 40 MG tablet Take 1 tablet (40 mg total) by mouth daily at 6 PM for 30 days. 30 tablet 6  . budesonide-formoterol (SYMBICORT) 80-4.5 MCG/ACT inhaler  Inhale 1 puff into the lungs 2 (two) times daily. 1 Inhaler 12  . carvedilol (COREG) 6.25 MG tablet Take 1.5 tablets (9.375 mg total) by mouth 2 (two) times daily with a meal for 30 days. 270 tablet 0  . ferrous sulfate 325 (65 FE) MG tablet Take 325 mg by mouth daily with breakfast.    . furosemide (LASIX) 40 MG tablet Take 40 mg by mouth 2 (two) times daily.     Marland Kitchen ipratropium (ATROVENT) 0.02 % nebulizer solution Take 2.5 mLs (0.5 mg total) by nebulization 2 (two) times daily. 75 mL 12  . omeprazole (PRILOSEC) 40 MG capsule Take 40 mg by mouth as needed.    . potassium chloride (K-DUR,KLOR-CON) 10 MEQ tablet Take 10 mEq by mouth daily.     . sacubitril-valsartan (ENTRESTO) 49-51 MG Take 1 tablet by mouth 2 (two) times daily. 60 tablet 6  . spironolactone (ALDACTONE) 25 MG tablet Take 1 tablet (25 mg total) by mouth daily. 30 tablet 6   No current facility-administered medications for this encounter.     Allergies:   Penicillins   Social History:  The patient  reports that he has quit smoking. He has never used smokeless tobacco. He reports that he does not drink alcohol or use drugs.   Family History:  The patient's family history includes Hypertension in his mother and sister.   ROS:  Please see the history of present illness.   All other systems are personally reviewed and negative.    Vitals:   11/13/18 1104  BP: 112/72  Pulse: 85  SpO2: 94%   Exam  General:  Walked into clinic. No resp difficulty HEENT: normal Neck: supple. no JVD. Carotids 2+ bilat; no bruits. No lymphadenopathy or thryomegaly appreciated. Cor: PMI nondisplaced. Regular rate & rhythm. No rubs, gallops or murmurs. Lungs: clear Abdomen: soft, nontender, nondistended. No hepatosplenomegaly. No bruits or masses. Good bowel sounds. Extremities: no cyanosis, clubbing, rash, edema Neuro: alert & orientedx3, cranial nerves grossly intact. moves all 4 extremities w/o difficulty. Affect pleasant  Recent Labs:  07/21/2018: B Natriuretic Peptide 964.4 07/30/2018: ALT 13 08/08/2018: Magnesium 2.0 08/13/2018: Hemoglobin 12.0; Platelets 402 11/04/2018: BUN 21; Creatinine, Ser 1.21; Potassium 4.3; Sodium 140  Personally reviewed   Wt Readings from Last 3 Encounters:  11/04/18 107.5 kg (237 lb)  10/28/18 105.7 kg (233 lb)  10/28/18 105.7 kg (233 lb)      ASSESSMENT AND PLAN:  1. Chronic systolic/diastolic HF due to NICM (?LBBB) - Echo 01/07/17 EF 45%. Cath with minimal CAD - Echo 07/22/18: EF 25-30%, global HK, RV normal function, increased wall thickness, LA moderately dilated, RA severely dilated - [facility] 2/24 with minimal non-obstructive CAD,NICM EF 30-35%, moderate pulmonary HTN due primarily to OSA/OHS, and relative well-compensated volume status, CI 2.6 - Echo today EF 25-30%  - Improved NYHAII symptoms. - Volume status stable. Continuelasix40 mg BID. -  Continue Entresto49/51mg  BID. - Continue spiro 25 mg daily. - Continue coreg 9.375 mg twice a day. - Followed by HF paramedicine.  - Will refer to EP for possible CRT-D (LBBB 144 ms)  2.Chronichypoxic respiratory failure -Back on [facility]Asince home from SNF.   3. Severe OSA - Severe OSA by PSG 10/31/18 AHI 65 - Struggling to get CPAP equipment at home -> he will follow up with Dr. Mayford Knifeurner  4. Mild nonobstructive CAD - On LHC 08/04/18 - Continue ASA 81 mg daily  -Continuelipitor 40 mg daily - No s/s ischemia   5. LBBB - as above. Consider CRT-D   COVID screen. The patient does not have any symptoms that suggest any further testing/ screening at this time.  Social distancing reinforced today.  Signed, Arvilla Meresaniel Abas Leicht, MD  11/13/2018 10:44 AM   [facility] [address] [facility] [place] [address] [phone] (office) [phone] (fax)

## 2018-11-13 NOTE — Addendum Note (Signed)
Encounter addended by: Nicole Cella, RN on: 11/13/2018 12:09 PM  Actions taken: Order list changed, Diagnosis association updated, Charge Capture section accepted, Clinical Note Signed

## 2018-11-14 ENCOUNTER — Telehealth (HOSPITAL_COMMUNITY): Payer: Self-pay | Admitting: Licensed Clinical Social Worker

## 2018-11-14 NOTE — Telephone Encounter (Signed)
CSW reached out to pt to check in regarding food and medication status at this time. Pt reports he is doing ok and has everything that he needs but that he is still having some confusion about next steps with his sleep study.  States he completed it weeks ago but has not heard regarding the results or when he is getting his CPAP.    CSW completed chart review and saw that pt has been recommended to get a CPAP and referral was placed with [facility] who were supposed to contact patient regarding next steps.  CSW messaged CMA who discussed this with patient to help follow up regarding the DME referral.  CSW encouraged pt to reach out with any concerns and will continue to follow and assist as needed  [name], LCSW Clinical Social Worker [facility] [phone]

## 2018-11-17 ENCOUNTER — Telehealth: Payer: Self-pay

## 2018-11-17 NOTE — Telephone Encounter (Signed)

## 2018-11-18 ENCOUNTER — Institutional Professional Consult (permissible substitution): Payer: Self-pay | Admitting: Internal Medicine

## 2018-11-18 ENCOUNTER — Encounter: Payer: Self-pay | Admitting: Cardiology

## 2018-11-18 DIAGNOSIS — G4733 Obstructive sleep apnea (adult) (pediatric): Secondary | ICD-10-CM

## 2018-11-18 HISTORY — DX: Obstructive sleep apnea (adult) (pediatric): G47.33

## 2018-11-18 NOTE — Progress Notes (Deleted)
Pandemic (e.g. social distancing) in an effort to limit this patient's exposure and mitigate transmission in our community.  Due to his co-morbid illnesses, this patient is at least at moderate risk for complications without adequate follow up.  This format is felt to be most appropriate for this patient at this time.  All issues noted in this document were discussed and addressed.  [name]   Evaluation Performed:  Follow-up visit  This visit type was conducted due to national recommendations for restrictions regarding the COVID-19 Pandemic (e.g. social distancing).  This format is felt to be most appropriate for this patient at this time.  All issues noted in this document were discussed and addressed.  No physical exam was performed (except for noted visual exam findings with Video Visits).  Please refer to the patient's chart (MyChart message for video visits and phone note for telephone visits) for the patient's consent to telehealth for [facility].  Date:  11/18/2018   ID:  Kristopher [name], DOB 05-14-1951, MRN [id number]  Patient Location:  Home  Provider location:   [place]  PCP:  Maurice Small, MD  Cardiologist:  Pierre Bali, MD Sleep Medicine:  Fransico Him, MD Electrophysiologist:  None   Chief Complaint:  OSA  History of Present Illness:    Kristopher [name] is a 68 y.o. male who presents via audio/video conferencing for a telehealth visit today.    This is a 68yo male with a history of DCM, chronic systolic CHF, CAD, HTN and OSA.  He recently underwent home sleep study showing Severe Obstructive Sleep Apnea with AHI 65.8/hr. Central sleep apnea was noted with an pAHIc of 11.4/hr. % Cheyne Stokes respirations was 10.3%. Nocturnal Hypoxemia was noted with O2 saturations as low as 72%. Time spent with Oxygen desaturations < 88% was 97.6  minutes. Severe snoring was noted throughout the study.  He was placed on Auto CPAP and recently saw Dr. Haroldine Laws and was having problems tolerating his device.   The patient does not have symptoms concerning for COVID-19 infection (fever, chills, cough, or new shortness of breath).    Prior CV studies:   The following studies were reviewed today:  PAP compliance download  Past Medical History:  Diagnosis Date  . Acute combined systolic and diastolic heart failure ([place]) 08/11/2016  . Acute esophagitis 08/17/2016  . Acute upper GI bleed 08/10/2016  . Asthma   . CAD (coronary artery disease) 08/16/2016  . CHF (congestive heart failure) ([place])   . DCM (dilated cardiomyopathy) ([place])   . Dyspnea   . HTN (hypertension)   . LBBB (left bundle branch block) 08/10/2016  . Lymphadenopathy 08/17/2016  . OSA (obstructive sleep apnea) 11/18/2018   [place] home sleep study revealed Severe Obstructive Sleep Apnea with AHI 65.8/hr. Central sleep apnea was noted with an pAHIc of 11.4/hr. % Cheyne Stokes respirations was 10.3%. Nocturnal Hypoxemia was noted with O2 saturations as low as 72%. Time spent with Oxygen desaturations < 88% was 97.6 minutes.  . PUD (peptic ulcer disease) 08/17/2016  . Pulmonary HTN ([place]) 08/17/2016   Past Surgical History:  Procedure Laterality Date  . ESOPHAGOGASTRODUODENOSCOPY (EGD) WITH PROPOFOL Left 08/11/2016   Procedure: ESOPHAGOGASTRODUODENOSCOPY (EGD) WITH PROPOFOL;  Surgeon: Arta Silence, MD;  Location[facility] ENDOSCOPY;  Service: Endoscopy;  Laterality: Left;  . RIGHT/LEFT HEART CATH AND CORONARY ANGIOGRAPHY N/A 08/15/2016   Procedure: Right/Left  Heart Cath and Coronary Angiography;  Surgeon: Corky Crafts, MD;  Location: [facility] INVASIVE CV LAB;  Service: Cardiovascular;  Laterality: N/A;  . RIGHT/LEFT HEART CATH AND CORONARY ANGIOGRAPHY N/A 08/04/2018   Procedure: RIGHT/LEFT HEART CATH AND CORONARY ANGIOGRAPHY;  Surgeon: Dolores Patty, MD;  [facility] INVASIVE CV LAB;   Service: Cardiovascular;  Laterality: N/A;     No outpatient medications have been marked as taking for the 11/19/18 encounter (Appointment) with Quintella Reichert, MD.     Allergies:   Penicillins   Social History   Tobacco Use  . Smoking status: Former Games developer  . Smokeless tobacco: Never Used  Substance Use Topics  . Alcohol use: No  . Drug use: No     Family Hx: The patient's family history includes Hypertension in his mother and sister.  ROS:   Please see the history of present illness.     All other systems reviewed and are negative.   Labs/Other Tests and Data Reviewed:    Recent Labs: 07/30/2018: ALT 13 08/08/2018: Magnesium 2.0 08/13/2018: Hemoglobin 12.0; Platelets 402 11/13/2018: B Natriuretic Peptide 58.8; BUN 12; Creatinine, Ser 1.18; Potassium 4.7; Sodium 138   Recent Lipid Panel Lab Results  Component Value Date/Time   CHOL 143 08/12/2016 04:37 AM   TRIG 200 (H) 07/26/2018 12:14 PM   HDL 39 (L) 08/12/2016 04:37 AM   CHOLHDL 3.7 08/12/2016 04:37 AM   LDLCALC 89 08/12/2016 04:37 AM    Wt Readings from Last 3 Encounters:  11/13/18 239 lb 12.8 oz (108.8 kg)  11/04/18 237 lb (107.5 kg)  10/28/18 233 lb (105.7 kg)     Objective:    Vital Signs:  There were no vitals taken for this visit.   CONSTITUTIONAL:  Well nourished, well developed male in no acute distress.  EYES: anicteric MOUTH: oral mucosa is pink RESPIRATORY: Normal respiratory effort, symmetric expansion  This encounter was created in error - please disregard.

## 2018-11-19 ENCOUNTER — Encounter: Payer: Medicare Other | Admitting: Cardiology

## 2018-11-19 ENCOUNTER — Other Ambulatory Visit: Payer: Self-pay

## 2018-11-19 VITALS — Ht 67.0 in | Wt 234.0 lb

## 2018-11-19 NOTE — Telephone Encounter (Signed)
Reached out to patient and was told that [facility] brought him over [facility] cpap [facility] to use during his stay there but when he left 3 months ago [employer] kept that cpap and [facility] is billing the patient for the unit. He did receive the [facility] home sleep kit and still has it. I called [facility]'s Billing Dept and they told me to have the patient call them at [phone] to get things straightened out with the patient

## 2018-11-20 ENCOUNTER — Other Ambulatory Visit (HOSPITAL_COMMUNITY): Payer: Self-pay

## 2018-11-20 ENCOUNTER — Other Ambulatory Visit (HOSPITAL_COMMUNITY): Payer: Self-pay | Admitting: Internal Medicine

## 2018-11-20 ENCOUNTER — Telehealth: Payer: Self-pay | Admitting: *Deleted

## 2018-11-20 NOTE — Progress Notes (Signed)
Paramedicine Encounter    Patient ID: Kristopher [name], male    DOB: [birth year], 68 y.o.   MRN: [id number]   Patient Care Team: Shirlean Mylar, MD as PCP - General (Family Medicine) Quintella Reichert, MD as PCP - Cardiology (Cardiology) Bensimhon, Bevelyn Buckles, MD as PCP - Advanced Heart Failure (Cardiology) [name], LCSW as Social Worker (Licensed Clinical Social Worker)  Patient Active Problem List   Diagnosis Date Noted  . OSA (obstructive sleep apnea) 11/18/2018  . Morbid obesity (HCC) 11/18/2018  . Acute on chronic combined systolic and diastolic congestive heart failure, NYHA class 4 (HCC) 07/21/2018  . PUD (peptic ulcer disease) 08/17/2016  . Acute esophagitis 08/17/2016  . Pulmonary HTN (HCC) 08/17/2016  . Lymphadenopathy 08/17/2016  . CAD (coronary artery disease) 08/16/2016  . Anemia   . DCM (dilated cardiomyopathy) (HCC)   . HTN (hypertension)   . Chronic combined systolic and diastolic heart failure (HCC) 08/11/2016  . Acute blood loss anemia 08/11/2016  . Acute upper GI bleed 08/10/2016  . DOE (dyspnea on exertion) 08/10/2016  . LBBB (left bundle branch block) 08/10/2016    Current Outpatient Medications:  .  acetaminophen (TYLENOL) 325 MG tablet, Take 650 mg by mouth every 6 (six) hours as needed., Disp: , Rfl:  .  albuterol (PROVENTIL) (2.5 MG/3ML) 0.083% nebulizer solution, Inhale 3 mLs into the lungs every 6 (six) hours as needed for wheezing or shortness of breath., Disp: , Rfl:  .  aspirin 81 MG chewable tablet, Chew 1 tablet (81 mg total) by mouth daily., Disp: 30 tablet, Rfl: 6 .  atorvastatin (LIPITOR) 40 MG tablet, Take 1 tablet (40 mg total) by mouth daily at 6 PM for 30 days., Disp: 30 tablet, Rfl: 6 .  budesonide-formoterol (SYMBICORT) 80-4.5 MCG/ACT inhaler, Inhale 1 puff into the lungs 2 (two) times daily., Disp: 1 Inhaler, Rfl: 12 .  carvedilol (COREG) 6.25 MG tablet, Take 1.5 tablets (9.375 mg total) by mouth 2 (two) times daily with a meal for 30 days.  (Patient taking differently: Take 6.25 mg by mouth 2 (two) times daily with a meal. ), Disp: 270 tablet, Rfl: 0 .  ferrous sulfate 325 (65 FE) MG tablet, Take 325 mg by mouth daily with breakfast., Disp: , Rfl:  .  furosemide (LASIX) 40 MG tablet, Take 40 mg by mouth 2 (two) times daily. , Disp: , Rfl:  .  ipratropium (ATROVENT) 0.02 % nebulizer solution, Take 2.5 mLs (0.5 mg total) by nebulization 2 (two) times daily., Disp: 75 mL, Rfl: 12 .  omeprazole (PRILOSEC) 40 MG capsule, Take 40 mg by mouth as needed., Disp: , Rfl:  .  potassium chloride (K-DUR,KLOR-CON) 10 MEQ tablet, Take 10 mEq by mouth daily. , Disp: , Rfl:  .  sacubitril-valsartan (ENTRESTO) 97-103 MG, Take 1 tablet by mouth 2 (two) times daily., Disp: 60 tablet, Rfl: 6 .  spironolactone (ALDACTONE) 25 MG tablet, Take 1 tablet (25 mg total) by mouth daily., Disp: 30 tablet, Rfl: 6 Allergies  Allergen Reactions  . Penicillins     Pt reports it makes him feel shaky Has patient had a PCN reaction causing immediate rash, facial/tongue/throat swelling, SOB or lightheadedness with hypotension: YES Has patient had a PCN reaction causing severe rash involving mucus membranes or skin necrosis: NO Has patient had a PCN reaction that required hospitalization NO Has patient had a PCN reaction occurring within the last 10 years: NO If all of the above answers are "NO", then may proceed with  Cephalosporin use.      Social History   Socioeconomic History  . Marital status: Single    Spouse name: Not on file  . Number of children: Not on file  . Years of education: Not on file  . Highest education level: Not on file  Occupational History  . Not on file  Social Needs  . Financial resource strain: Not on file  . Food insecurity    Worry: Not on file    Inability: Not on file  . Transportation needs    Medical: Not on file    Non-medical: Not on file  Tobacco Use  . Smoking status: Former Research scientist (life sciences)  . Smokeless tobacco: Never Used   Substance and Sexual Activity  . Alcohol use: No  . Drug use: No  . Sexual activity: Not on file  Lifestyle  . Physical activity    Days per week: Not on file    Minutes per session: Not on file  . Stress: Not on file  Relationships  . Social Herbalist on phone: Not on file    Gets together: Not on file    Attends religious service: Not on file    Active member of club or organization: Not on file    Attends meetings of clubs or organizations: Not on file    Relationship status: Not on file  . Intimate partner violence    Fear of current or ex partner: Not on file    Emotionally abused: Not on file    Physically abused: Not on file    Forced sexual activity: Not on file  Other Topics Concern  . Not on file  Social History Narrative  . Not on file    Physical Exam      Future Appointments  Date Time Provider [place]  11/25/2018  9:30 AM Baird Lyons D, MD [facility] None    BP 100/60   Pulse 84   Temp 97.9 F (36.6 C)   Resp 16   Wt 235 lb (106.6 kg)   SpO2 94%   BMI 36.81 kg/m  B/p standing-110/80 Weight yesterday-235 lb Last visit weight-237  Pt reports he is doing good, he had ECHO done last week and is being referred to electrical doc for further.  He is very leary about the idea of being cut on, however he states he will go and listen to him and see what the doc has to say.  He denies sob. No edema noted.  No c/p, no dizziness.  He is still waiting to hear back about his cpap and that confusion. He called the home health billing and they couldn't give him any information and was told to call back. Checked pill box and no errors noted. Refilled potassium and he will go by there to pick it up.    Marylouise Stacks, [facility] Paramedic  11/20/18

## 2018-11-20 NOTE — Telephone Encounter (Signed)
yes

## 2018-11-20 NOTE — Telephone Encounter (Signed)
-----  Message from Sueanne Margarita, MD sent at 11/19/2018  6:30 PM EDT ----- Madaline Brilliant - is the sleep kit the PAP unit from [facility]?  Traci ----- Message ----- From: Freada Bergeron, CMA Sent: 11/19/2018   3:46 PM EDT To: Sueanne Margarita, MD  Reached out to patient and was told that [facility] brought him over a cpap to [facility] to use during his stay there but when he left 3 months ago [employer] kept that cpap and [facility] is billing the patient for the unit. He did receive the [facility] home sleep kit and still has it. I called [facility]'s Billing Dept and they told me to have the patient call them at [phone] to get things straightened out with the patient. ----- Message ----- From: Sueanne Margarita, MD Sent: 11/19/2018   8:50 AM EDT To: Freada Bergeron, CMA  Please call patient.  There is some confusion about his CPAP.  Apparently he had been at [facility] rehab for a while and was sent over there with his CPAP from the hospital.  He is apparently getting charged for that CPAP and should not be because is not his CPAP.  I need you to call [facility] to figure out what is going on there.  And then I also need to find out where that CPAP from [facility] was sent to because he has not received it and it was ordered more than 2 weeks ago.  Please call the patient to get this straightened out

## 2018-11-21 NOTE — Progress Notes (Signed)
This encounter was created in error - please disregard.

## 2018-11-25 ENCOUNTER — Encounter: Payer: Self-pay | Admitting: Internal Medicine

## 2018-11-25 ENCOUNTER — Ambulatory Visit (INDEPENDENT_AMBULATORY_CARE_PROVIDER_SITE_OTHER): Payer: Medicare Other | Admitting: Internal Medicine

## 2018-11-25 ENCOUNTER — Telehealth (HOSPITAL_COMMUNITY): Payer: Self-pay | Admitting: Licensed Clinical Social Worker

## 2018-11-25 ENCOUNTER — Other Ambulatory Visit: Payer: Self-pay

## 2018-11-25 DIAGNOSIS — G4733 Obstructive sleep apnea (adult) (pediatric): Secondary | ICD-10-CM | POA: Diagnosis not present

## 2018-11-25 NOTE — Assessment & Plan Note (Signed)
Emphasized importance of working his weight down.

## 2018-11-25 NOTE — Patient Instructions (Signed)
Order- new DME, new CPAP auto 5-20, mask of choice, humidifier, supplies, AirView/ card  Please call if we can help 

## 2018-11-25 NOTE — Progress Notes (Signed)
11/25/2018- [age] yoM former smoker for sleep evaluation. Medical problem list includes Pulmonary HTN, LBBB, HBP, Dilated cardiomyopathy, s&dCHF, OSA, Peptic Ulcer Disease/ esophagitis, Morbid obesity  Sleep study 10/28/2018-  home sleep study revealed Severe Obstructive Sleep Apnea with AHI 65.8/hr. Central sleep apnea was noted with an pAHIc of 11.4/hr. % Cheyne Stokes respirations was 10.3%. Nocturnal Hypoxemia was noted with O2 saturations as low as 72%. Time spent with Oxygen desaturations < 88% was 97.6 minutes.I -----Had home sleep study and is now getting established to receive a CPAP. He was on CP[facility] therapy [facility] is [facility] but does not have one of his own in the home.  Body weight today 240 lbs Epworth score 8 He was on CPAP and liked [facility], but could not take it home with him and needs to establish DME. [facility]. Son on CPAP.   Prior to Admission medications   Medication Sig Start Date End Date Taking? Authorizing Provider  acetaminophen (TYLENOL) 325 MG tablet Take 650 mg by mouth every 6 (six) hours as needed.   Yes [provider]  albuterol (PROVENTIL) (2.5 MG/3ML) 0.083% nebulizer solution Inhale 3 mLs into the lungs every 6 (six) hours as needed for wheezing or shortness of breath. 06/20/18  Yes [provider]  aspirin 81 MG chewable tablet Chew 1 tablet (81 mg total) by mouth daily. 09/15/18  Yes Alford HighlandSmith, [name] M, NP  atorvastatin (LIPITOR) 40 MG tablet Take 1 tablet (40 mg total) by mouth daily at 6 PM for 30 days. 09/15/18 11/13/19 Yes Alford HighlandSmith, [name] M, NP  budesonide-formoterol (SYMBICORT) 80-4.5 MCG/ACT inhaler Inhale 1 puff into the lungs 2 (two) times daily. 08/17/16  Yes Rama, Maryruth Bunhristina P, MD  carvedilol (COREG) 6.25 MG tablet Take 1.5 tablets (9.375 mg total) by mouth 2 (two) times daily with a meal for 30 days. Patient taking differently: Take 6.25 mg by mouth 2 (two) times daily with a meal.  10/28/18 11/27/18 Yes Laurey MoraleMcLean, Dalton S,  MD  ferrous sulfate 325 (65 FE) MG tablet Take 325 mg by mouth daily with breakfast.   Yes [provider]  furosemide (LASIX) 40 MG tablet Take 40 mg by mouth 2 (two) times daily.    Yes [provider]  ipratropium (ATROVENT) 0.02 % nebulizer solution Take 2.5 mLs (0.5 mg total) by nebulization 2 (two) times daily. 08/08/18  Yes Zigmund DanielPowell, A Caldwell Jr., MD  omeprazole (PRILOSEC) 40 MG capsule Take 40 mg by mouth as needed.   Yes [provider]  potassium chloride (K-DUR,KLOR-CON) 10 MEQ tablet Take 10 mEq by mouth daily.    Yes [provider]  sacubitril-valsartan (ENTRESTO) 97-103 MG Take 1 tablet by mouth 2 (two) times daily. 11/13/18  Yes Bensimhon, Bevelyn Bucklesaniel R, MD  spironolactone (ALDACTONE) 25 MG tablet Take 1 tablet (25 mg total) by mouth daily. 09/15/18  Yes Alford HighlandSmith, [name] M, NP   Past Medical History:  Diagnosis Date  . Acute combined systolic and diastolic heart failure (HCC) 08/11/2016  . Acute esophagitis 08/17/2016  . Acute upper GI bleed 08/10/2016  . Asthma   . CAD (coronary artery disease) 08/16/2016  . CHF (congestive heart failure) (HCC)   . DCM (dilated cardiomyopathy) (HCC)   . Dyspnea   . HTN (hypertension)   . LBBB (left bundle branch block) 08/10/2016  . Lymphadenopathy 08/17/2016  . OSA (obstructive sleep apnea) 11/18/2018   [place] home sleep study revealed Severe Obstructive Sleep Apnea with AHI 65.8/hr. Central sleep apnea was  noted with an pAHIc of 11.4/hr. % Cheyne Stokes respirations was 10.3%. Nocturnal Hypoxemia was noted with O2 saturations as low as 72%. Time spent with Oxygen desaturations < 88% was 97.6 minutes.  . PUD (peptic ulcer disease) 08/17/2016  . Pulmonary HTN ([place]) 08/17/2016   Past Surgical History:  Procedure Laterality Date  . ESOPHAGOGASTRODUODENOSCOPY (EGD) WITH PROPOFOL Left 08/11/2016   Procedure: ESOPHAGOGASTRODUODENOSCOPY (EGD) WITH PROPOFOL;  Surgeon: Arta Silence, MD;  Location: [facility] ENDOSCOPY;  Service: Endoscopy;   Laterality: Left;  . RIGHT/LEFT HEART CATH AND CORONARY ANGIOGRAPHY N/A 08/15/2016   Procedure: Right/Left Heart Cath and Coronary Angiography;  Surgeon: Jettie Booze, MD;  Location: [place] CV LAB;  Service: Cardiovascular;  Laterality: N/A;  . RIGHT/LEFT HEART CATH AND CORONARY ANGIOGRAPHY N/A 08/04/2018   Procedure: RIGHT/LEFT HEART CATH AND CORONARY ANGIOGRAPHY;  Surgeon: Jolaine Artist, MD;  Location: No Name CV LAB;  Service: Cardiovascular;  Laterality: N/A;   Family History  Problem Relation Age of Onset  . Hypertension Mother   . Hypertension Sister    Social History   Socioeconomic History  . Marital status: Single    Spouse name: Not on file  . Number of children: Not on file  . Years of education: Not on file  . Highest education level: Not on file  Occupational History  . Not on file  Social Needs  . Financial resource strain: Not on file  . Food insecurity    Worry: Not on file    Inability: Not on file  . Transportation needs    Medical: Not on file    Non-medical: Not on file  Tobacco Use  . Smoking status: Former Smoker    Packs/day: 0.50    Years: 44.00    Pack years: 22.00    Types: Cigarettes    Quit date: 06/11/1998    Years since quitting: 20.4  . Smokeless tobacco: Never Used  Substance and Sexual Activity  . Alcohol use: No  . Drug use: No  . Sexual activity: Not on file  Lifestyle  . Physical activity    Days per week: Not on file    Minutes per session: Not on file  . Stress: Not on file  Relationships  . Social Herbalist on phone: Not on file    Gets together: Not on file    Attends religious service: Not on file    Active member of club or organization: Not on file    Attends meetings of clubs or organizations: Not on file    Relationship status: Not on file  . Intimate partner violence    Fear of current or ex partner: Not on file    Emotionally abused: Not on file    Physically abused: Not on file     Forced sexual activity: Not on file  Other Topics Concern  . Not on file  Social History Narrative  . Not on file   ROS-see HPI   + =positive Constitutional:    weight loss, night sweats, fevers, chills, fatigue, lassitude. HEENT:    headaches, difficulty swallowing, tooth/dental problems, sore throat,       Sneezing,+ itching, ear ache, nasal congestion, post nasal drip, snoring CV:    chest pain, orthopnea, PND, swelling in lower extremities, anasarca,                                  dizziness,  palpitations Resp:   shortness of breath with exertion or at rest.                productive cough,   non-productive cough, coughing up of blood.              change in color of mucus.  wheezing.   Skin:    rash or lesions. GI:  No-   heartburn, indigestion, abdominal pain, nausea, vomiting, diarrhea,                 change in bowel habits, loss of appetite GU: dysuria, change in color of urine, no urgency or frequency.   flank pain. MS:   joint pain, stiffness, decreased range of motion, back pain. Neuro-     nothing unusual Psych:  change in mood or affect.  depression or anxiety.   memory loss.  OBJ- Physical Exam General- Alert, Oriented, Affect-appropriate, Distress- none acute, + obese Skin- rash-none, lesions- none, excoriation- none Lymphadenopathy- none Head- atraumatic            Eyes- Gross vision intact, PERRLA, conjunctivae and secretions clear            Ears- Hearing, canals-normal            Nose- Clear, no-Septal dev, mucus, polyps, erosion, perforation             Throat- Mallampati IV , mucosa clear , drainage- none, tonsils- atrophic Neck- flexible , trachea midline, no stridor , thyroid nl, carotid no bruit Chest - symmetrical excursion , unlabored           Heart/CV- RRR , no murmur , no gallop  , no rub, nl s1 s2                           - JVD+1 , edema- none, stasis changes- none, varices- none           Lung- +diminished, wheeze- none, cough- none ,  dullness-none, rub- none           Chest wall-  Abd-  Br/ Gen/ Rectal- Not done, not indicated Extrem- cyanosis- none, clubbing, none, atrophy- none, strength- nl Neuro- grossly intact to observation

## 2018-11-25 NOTE — Telephone Encounter (Signed)
CSW trying to assist in resolving issue with patient CPAP that he was using at [facility].  Patient was using a CPAP while at SNF but it was a rental and he was unable to take it with him.  Pt is now getting a bill for this equipment stating that it was not returned to [facility] and still charging the pt for rental though he was not the one who took out the rental or was responsible for returning it.  CSW called [employer] to discuss- they see that patient is being charged for renting the equipment because they have no record of receiving the equipment back- CSW awaiting call back from a supervisor to discuss  CSW also left message for SNF admissions coordinator to see if they can assist in locating equipment.  CSW will continue to follow and assist as needed  Jorge Ny, [facility] Desk#: [phone] Cell#: [phone]

## 2018-11-25 NOTE — Assessment & Plan Note (Signed)
Severe OSA. Education done, treatment options reviewed, driving responsibility. Weight. Plan start CPAP auto 5-20

## 2018-11-26 ENCOUNTER — Telehealth (HOSPITAL_COMMUNITY): Payer: Self-pay | Admitting: Licensed Clinical Social Worker

## 2018-11-26 NOTE — Telephone Encounter (Signed)
CSW received call back from admissions coordinator at [facility] regarding pt CPAP- she is sending email to multiple admin staff to help find out what happened with patients CPAP after he was discharged from the facility  CSW will continue to follow and assist as needed  Jorge Ny, [facility] Desk#: [phone] Cell#: [phone]

## 2018-12-04 ENCOUNTER — Other Ambulatory Visit (HOSPITAL_COMMUNITY): Payer: Self-pay | Admitting: Cardiology

## 2018-12-04 ENCOUNTER — Other Ambulatory Visit (HOSPITAL_COMMUNITY): Payer: Self-pay

## 2018-12-04 MED ORDER — FUROSEMIDE 40 MG PO TABS: 40.0000 mg | ORAL_TABLET | Freq: Two times a day (BID) | ORAL | 3 refills | Status: DC

## 2018-12-04 MED ORDER — CARVEDILOL 6.25 MG PO TABS: 9.3750 mg | ORAL_TABLET | Freq: Two times a day (BID) | ORAL | 3 refills | Status: DC

## 2018-12-04 MED ORDER — POTASSIUM CHLORIDE CRYS ER 10 MEQ PO TBCR: 10.0000 meq | EXTENDED_RELEASE_TABLET | Freq: Every day | ORAL | 3 refills | Status: DC

## 2018-12-04 NOTE — Progress Notes (Signed)
Paramedicine Encounter    Patient ID: Kristopher [name], male    DOB: 21-Mar-1951, 68 y.o.   MRN: [id number]   Patient Care Team: Maurice Small, MD as PCP - General (Family Medicine) Sueanne Margarita, MD as PCP - Cardiology (Cardiology) Bensimhon, Shaune Pascal, MD as PCP - Advanced Heart Failure (Cardiology) Jorge Ny, LCSW as Social Worker (Licensed Clinical Social Worker)  Patient Active Problem List   Diagnosis Date Noted  . OSA (obstructive sleep apnea) 11/18/2018  . Morbid obesity ([place]) 11/18/2018  . Acute on chronic combined systolic and diastolic congestive heart failure, NYHA class 4 ([place]) 07/21/2018  . PUD (peptic ulcer disease) 08/17/2016  . Acute esophagitis 08/17/2016  . Pulmonary HTN ([place]) 08/17/2016  . Lymphadenopathy 08/17/2016  . CAD (coronary artery disease) 08/16/2016  . Anemia   . DCM (dilated cardiomyopathy) ([place])   . HTN (hypertension)   . Chronic combined systolic and diastolic heart failure ([place]) 08/11/2016  . Acute blood loss anemia 08/11/2016  . Acute upper GI bleed 08/10/2016  . DOE (dyspnea on exertion) 08/10/2016  . LBBB (left bundle branch block) 08/10/2016    Current Outpatient Medications:  .  acetaminophen (TYLENOL) 325 MG tablet, Take 650 mg by mouth every 6 (six) hours as needed., Disp: , Rfl:  .  albuterol (PROVENTIL) (2.5 MG/3ML) 0.083% nebulizer solution, Inhale 3 mLs into the lungs every 6 (six) hours as needed for wheezing or shortness of breath., Disp: , Rfl:  .  aspirin 81 MG chewable tablet, Chew 1 tablet (81 mg total) by mouth daily., Disp: 30 tablet, Rfl: 6 .  atorvastatin (LIPITOR) 40 MG tablet, Take 1 tablet (40 mg total) by mouth daily at 6 PM for 30 days., Disp: 30 tablet, Rfl: 6 .  budesonide-formoterol (SYMBICORT) 80-4.5 MCG/ACT inhaler, Inhale 1 puff into the lungs 2 (two) times daily., Disp: 1 Inhaler, Rfl: 12 .  carvedilol (COREG) 6.25 MG tablet, Take 1.5 tablets (9.375 mg total) by mouth 2 (two) times daily with a meal for 30 days.,  Disp: 270 tablet, Rfl: 3 .  ferrous sulfate 325 (65 FE) MG tablet, Take 325 mg by mouth daily with breakfast., Disp: , Rfl:  .  furosemide (LASIX) 40 MG tablet, Take 1 tablet (40 mg total) by mouth 2 (two) times daily., Disp: 60 tablet, Rfl: 3 .  ipratropium (ATROVENT) 0.02 % nebulizer solution, Take 2.5 mLs (0.5 mg total) by nebulization 2 (two) times daily., Disp: 75 mL, Rfl: 12 .  omeprazole (PRILOSEC) 40 MG capsule, Take 40 mg by mouth as needed., Disp: , Rfl:  .  potassium chloride (K-DUR) 10 MEQ tablet, Take 1 tablet (10 mEq total) by mouth daily., Disp: 30 tablet, Rfl: 3 .  sacubitril-valsartan (ENTRESTO) 97-103 MG, Take 1 tablet by mouth 2 (two) times daily., Disp: 60 tablet, Rfl: 6 .  spironolactone (ALDACTONE) 25 MG tablet, Take 1 tablet (25 mg total) by mouth daily., Disp: 30 tablet, Rfl: 6 Allergies  Allergen Reactions  . Penicillins     Pt reports it makes him feel shaky Has patient had a PCN reaction causing immediate rash, facial/tongue/throat swelling, SOB or lightheadedness with hypotension: YES Has patient had a PCN reaction causing severe rash involving mucus membranes or skin necrosis: NO Has patient had a PCN reaction that required hospitalization NO Has patient had a PCN reaction occurring within the last 10 years: NO If all of the above answers are "NO", then may proceed with Cephalosporin use.      Social History  Socioeconomic History  . Marital status: Single    Spouse name: Not on file  . Number of children: Not on file  . Years of education: Not on file  . Highest education level: Not on file  Occupational History  . Not on file  Social Needs  . Financial resource strain: Not on file  . Food insecurity    Worry: Not on file    Inability: Not on file  . Transportation needs    Medical: Not on file    Non-medical: Not on file  Tobacco Use  . Smoking status: Former Smoker    Packs/day: 0.50    Years: 44.00    Pack years: 22.00    Types: Cigarettes     Quit date: 06/11/1998    Years since quitting: 20.4  . Smokeless tobacco: Never Used  Substance and Sexual Activity  . Alcohol use: No  . Drug use: No  . Sexual activity: Not on file  Lifestyle  . Physical activity    Days per week: Not on file    Minutes per session: Not on file  . Stress: Not on file  Relationships  . Social Herbalist on phone: Not on file    Gets together: Not on file    Attends religious service: Not on file    Active member of club or organization: Not on file    Attends meetings of clubs or organizations: Not on file    Relationship status: Not on file  . Intimate partner violence    Fear of current or ex partner: Not on file    Emotionally abused: Not on file    Physically abused: Not on file    Forced sexual activity: Not on file  Other Topics Concern  . Not on file  Social History Narrative  . Not on file    Physical Exam      Future Appointments  Date Time Provider [place]  03/17/2019  9:30 AM Baird Lyons D, MD [facility] None    BP 124/70   Pulse 84   Temp 97.9 F (36.6 C)   Resp 15   Wt 232 lb (105.2 kg)   SpO2 96%   BMI 36.34 kg/m   Weight yesterday-?? Last visit weight-235  Pt reports feeling ok, he feels he is wheezing more, he normally does his neb tx and inhalers every morning but he hasnt used neb tx this morning yet. He states it really isnt bothering him, he can hear it sometimes when he does too much or talks too long.  He denies c/p, no h/a. No dizziness.  He finally got his CPAP worked out and is able to pick it up tomor-he reports his daughter started calling around and finally got it worked out.  The pharmacy did not fill his potassium-they told him it was waiting order from doc--I called clinic and [facility] will send it over along with the correct instructions for the lasix and carvedilol.  meds verified and pill box checked-he fills it himself and does great. Little air movement to the  left side of lungs with slight wheeze-he will use neb tx after I leave.    Marylouise Stacks, [place] [facility] Paramedic  12/04/18

## 2018-12-05 NOTE — Telephone Encounter (Signed)
Reached out to the patient today to see how things were going with his cpap and patient says [facility] called him Wednesday 6/24 to come on Friday 6/26 and pick up his cpap. Patient got set up with a new cpap today.  Upon patient request DME selection is [facility] Patient understands he will be contacted by [facility] to set up his cpap. Patient understands to call if [facility] does not contact him with new setup in a timely manner. Patient understands they will be called once confirmation has been received from [facility] that they have received their new machine to schedule 10 week follow up appointment.  Please add to airview Patient was grateful for the call and thanked me.  Patient has a 10 week follow up appointment scheduled for 02/12/19. Patient understands he needs to keep this appointment for insurance compliance. Patient was grateful for the call and thanked me.    [place] Kitchen

## 2018-12-17 ENCOUNTER — Other Ambulatory Visit (HOSPITAL_COMMUNITY): Payer: Self-pay

## 2018-12-17 NOTE — Progress Notes (Signed)
Paramedicine Encounter    Patient ID: Kristopher [name], male    DOB: 21-Mar-1951, 68 y.o.   MRN: [id number]   Patient Care Team: Maurice Small, MD as PCP - General (Family Medicine) Sueanne Margarita, MD as PCP - Cardiology (Cardiology) Bensimhon, Shaune Pascal, MD as PCP - Advanced Heart Failure (Cardiology) Jorge Ny, LCSW as Social Worker (Licensed Clinical Social Worker)  Patient Active Problem List   Diagnosis Date Noted  . OSA (obstructive sleep apnea) 11/18/2018  . Morbid obesity ([place]) 11/18/2018  . Acute on chronic combined systolic and diastolic congestive heart failure, NYHA class 4 ([place]) 07/21/2018  . PUD (peptic ulcer disease) 08/17/2016  . Acute esophagitis 08/17/2016  . Pulmonary HTN ([place]) 08/17/2016  . Lymphadenopathy 08/17/2016  . CAD (coronary artery disease) 08/16/2016  . Anemia   . DCM (dilated cardiomyopathy) ([place])   . HTN (hypertension)   . Chronic combined systolic and diastolic heart failure ([place]) 08/11/2016  . Acute blood loss anemia 08/11/2016  . Acute upper GI bleed 08/10/2016  . DOE (dyspnea on exertion) 08/10/2016  . LBBB (left bundle branch block) 08/10/2016    Current Outpatient Medications:  .  acetaminophen (TYLENOL) 325 MG tablet, Take 650 mg by mouth every 6 (six) hours as needed., Disp: , Rfl:  .  albuterol (PROVENTIL) (2.5 MG/3ML) 0.083% nebulizer solution, Inhale 3 mLs into the lungs every 6 (six) hours as needed for wheezing or shortness of breath., Disp: , Rfl:  .  aspirin 81 MG chewable tablet, Chew 1 tablet (81 mg total) by mouth daily., Disp: 30 tablet, Rfl: 6 .  atorvastatin (LIPITOR) 40 MG tablet, Take 1 tablet (40 mg total) by mouth daily at 6 PM for 30 days., Disp: 30 tablet, Rfl: 6 .  budesonide-formoterol (SYMBICORT) 80-4.5 MCG/ACT inhaler, Inhale 1 puff into the lungs 2 (two) times daily., Disp: 1 Inhaler, Rfl: 12 .  carvedilol (COREG) 6.25 MG tablet, Take 1.5 tablets (9.375 mg total) by mouth 2 (two) times daily with a meal for 30 days.,  Disp: 270 tablet, Rfl: 3 .  ferrous sulfate 325 (65 FE) MG tablet, Take 325 mg by mouth daily with breakfast., Disp: , Rfl:  .  furosemide (LASIX) 40 MG tablet, Take 1 tablet (40 mg total) by mouth 2 (two) times daily., Disp: 60 tablet, Rfl: 3 .  ipratropium (ATROVENT) 0.02 % nebulizer solution, Take 2.5 mLs (0.5 mg total) by nebulization 2 (two) times daily., Disp: 75 mL, Rfl: 12 .  omeprazole (PRILOSEC) 40 MG capsule, Take 40 mg by mouth as needed., Disp: , Rfl:  .  potassium chloride (K-DUR) 10 MEQ tablet, Take 1 tablet (10 mEq total) by mouth daily., Disp: 30 tablet, Rfl: 3 .  sacubitril-valsartan (ENTRESTO) 97-103 MG, Take 1 tablet by mouth 2 (two) times daily., Disp: 60 tablet, Rfl: 6 .  spironolactone (ALDACTONE) 25 MG tablet, Take 1 tablet (25 mg total) by mouth daily., Disp: 30 tablet, Rfl: 6 Allergies  Allergen Reactions  . Penicillins     Pt reports it makes him feel shaky Has patient had a PCN reaction causing immediate rash, facial/tongue/throat swelling, SOB or lightheadedness with hypotension: YES Has patient had a PCN reaction causing severe rash involving mucus membranes or skin necrosis: NO Has patient had a PCN reaction that required hospitalization NO Has patient had a PCN reaction occurring within the last 10 years: NO If all of the above answers are "NO", then may proceed with Cephalosporin use.      Social History  Socioeconomic History  . Marital status: Single    Spouse name: Not on file  . Number of children: Not on file  . Years of education: Not on file  . Highest education level: Not on file  Occupational History  . Not on file  Social Needs  . Financial resource strain: Not on file  . Food insecurity    Worry: Not on file    Inability: Not on file  . Transportation needs    Medical: Not on file    Non-medical: Not on file  Tobacco Use  . Smoking status: Former Smoker    Packs/day: 0.50    Years: 44.00    Pack years: 22.00    Types: Cigarettes     Quit date: 06/11/1998    Years since quitting: 20.5  . Smokeless tobacco: Never Used  Substance and Sexual Activity  . Alcohol use: No  . Drug use: No  . Sexual activity: Not on file  Lifestyle  . Physical activity    Days per week: Not on file    Minutes per session: Not on file  . Stress: Not on file  Relationships  . Social Musician on phone: Not on file    Gets together: Not on file    Attends religious service: Not on file    Active member of club or organization: Not on file    Attends meetings of clubs or organizations: Not on file    Relationship status: Not on file  . Intimate partner violence    Fear of current or ex partner: Not on file    Emotionally abused: Not on file    Physically abused: Not on file    Forced sexual activity: Not on file  Other Topics Concern  . Not on file  Social History Narrative  . Not on file    Physical Exam      Future Appointments  Date Time Provider Department Center  02/12/2019  9:20 AM Quintella Reichert, MD [facility]  03/17/2019  9:30 AM Waymon Budge, MD [facility] None    BP 110/64   Pulse 86   Temp 97.7 F (36.5 C)   Resp 20   Wt 232 lb (105.2 kg)   SpO2 98%   BMI 36.34 kg/m   Weight yesterday-233 Last visit weight-232  Pt reports he is doing well,  He denies increased sob, he denies dizziness, no c/p, no h/a, no n/v.  meds verified and pill box checked. He had found the 49-51mg  of entresto and was using that but some of them did not have the 2 pills to get the 97-103 dose and some of them did. That was corrected. He has all of his meds.  Pt states he is breathing good since he has CPAP.  He has 2 meds the pharmacy for pick up. Spiro-lasix $11 He will go get that. He states his energy level is great and appetite good. Sleeping better too.   Kerry Hough, EMT-Paramedic [phone] [facility] Paramedic  12/17/18

## 2019-01-01 ENCOUNTER — Other Ambulatory Visit (HOSPITAL_COMMUNITY): Payer: Self-pay

## 2019-01-01 ENCOUNTER — Telehealth (HOSPITAL_COMMUNITY): Payer: Self-pay | Admitting: *Deleted

## 2019-01-01 DIAGNOSIS — I5022 Chronic systolic (congestive) heart failure: Secondary | ICD-10-CM

## 2019-01-01 MED ORDER — FUROSEMIDE 40 MG PO TABS: 80.0000 mg | ORAL_TABLET | Freq: Two times a day (BID) | ORAL | 3 refills | Status: DC

## 2019-01-01 NOTE — Telephone Encounter (Signed)
Kristopher [name] called to report pts weight is 245lbs from 232lbs two weeks ago.  Patient admits to having a bad diet and not following fluid restrictions. Pt is asymptomatic but has some swelling in his abdomen. Pt has taken an extra 40mg  of lasix since Tuesday and said he has been urinating more frequently. Pt only weighs when Kristopher [name] comes to see him so there isnt a way to tell if his weight is coming down since medication increase. Kristopher [name] reeducated on diet and the importance of weighing daily. Per Amy Clegg,NP-C take lasix 80mg  bid and get lab work next week. Kristopher [name] and patient aware and agreeable lab appt scheduled.

## 2019-01-01 NOTE — Progress Notes (Signed)
Paramedicine Encounter    Patient ID: Kristopher [name], male    DOB: 21-Mar-1951, 68 y.o.   MRN: [id number]   Patient Care Team: Maurice Small, MD as PCP - General (Family Medicine) Sueanne Margarita, MD as PCP - Cardiology (Cardiology) Bensimhon, Shaune Pascal, MD as PCP - Advanced Heart Failure (Cardiology) Jorge Ny, LCSW as Social Worker (Licensed Clinical Social Worker)  Patient Active Problem List   Diagnosis Date Noted  . OSA (obstructive sleep apnea) 11/18/2018  . Morbid obesity ([place]) 11/18/2018  . Acute on chronic combined systolic and diastolic congestive heart failure, NYHA class 4 ([place]) 07/21/2018  . PUD (peptic ulcer disease) 08/17/2016  . Acute esophagitis 08/17/2016  . Pulmonary HTN ([place]) 08/17/2016  . Lymphadenopathy 08/17/2016  . CAD (coronary artery disease) 08/16/2016  . Anemia   . DCM (dilated cardiomyopathy) ([place])   . HTN (hypertension)   . Chronic combined systolic and diastolic heart failure ([place]) 08/11/2016  . Acute blood loss anemia 08/11/2016  . Acute upper GI bleed 08/10/2016  . DOE (dyspnea on exertion) 08/10/2016  . LBBB (left bundle branch block) 08/10/2016    Current Outpatient Medications:  .  acetaminophen (TYLENOL) 325 MG tablet, Take 650 mg by mouth every 6 (six) hours as needed., Disp: , Rfl:  .  albuterol (PROVENTIL) (2.5 MG/3ML) 0.083% nebulizer solution, Inhale 3 mLs into the lungs every 6 (six) hours as needed for wheezing or shortness of breath., Disp: , Rfl:  .  aspirin 81 MG chewable tablet, Chew 1 tablet (81 mg total) by mouth daily., Disp: 30 tablet, Rfl: 6 .  atorvastatin (LIPITOR) 40 MG tablet, Take 1 tablet (40 mg total) by mouth daily at 6 PM for 30 days., Disp: 30 tablet, Rfl: 6 .  budesonide-formoterol (SYMBICORT) 80-4.5 MCG/ACT inhaler, Inhale 1 puff into the lungs 2 (two) times daily., Disp: 1 Inhaler, Rfl: 12 .  carvedilol (COREG) 6.25 MG tablet, Take 1.5 tablets (9.375 mg total) by mouth 2 (two) times daily with a meal for 30 days.,  Disp: 270 tablet, Rfl: 3 .  ferrous sulfate 325 (65 FE) MG tablet, Take 325 mg by mouth daily with breakfast., Disp: , Rfl:  .  furosemide (LASIX) 40 MG tablet, Take 1 tablet (40 mg total) by mouth 2 (two) times daily., Disp: 60 tablet, Rfl: 3 .  ipratropium (ATROVENT) 0.02 % nebulizer solution, Take 2.5 mLs (0.5 mg total) by nebulization 2 (two) times daily., Disp: 75 mL, Rfl: 12 .  omeprazole (PRILOSEC) 40 MG capsule, Take 40 mg by mouth as needed., Disp: , Rfl:  .  potassium chloride (K-DUR) 10 MEQ tablet, Take 1 tablet (10 mEq total) by mouth daily., Disp: 30 tablet, Rfl: 3 .  sacubitril-valsartan (ENTRESTO) 97-103 MG, Take 1 tablet by mouth 2 (two) times daily., Disp: 60 tablet, Rfl: 6 .  spironolactone (ALDACTONE) 25 MG tablet, Take 1 tablet (25 mg total) by mouth daily., Disp: 30 tablet, Rfl: 6 Allergies  Allergen Reactions  . Penicillins     Pt reports it makes him feel shaky Has patient had a PCN reaction causing immediate rash, facial/tongue/throat swelling, SOB or lightheadedness with hypotension: YES Has patient had a PCN reaction causing severe rash involving mucus membranes or skin necrosis: NO Has patient had a PCN reaction that required hospitalization NO Has patient had a PCN reaction occurring within the last 10 years: NO If all of the above answers are "NO", then may proceed with Cephalosporin use.      Social History  Socioeconomic History  . Marital status: Single    Spouse name: Not on file  . Number of children: Not on file  . Years of education: Not on file  . Highest education level: Not on file  Occupational History  . Not on file  Social Needs  . Financial resource strain: Not on file  . Food insecurity    Worry: Not on file    Inability: Not on file  . Transportation needs    Medical: Not on file    Non-medical: Not on file  Tobacco Use  . Smoking status: Former Smoker    Packs/day: 0.50    Years: 44.00    Pack years: 22.00    Types: Cigarettes     Quit date: 06/11/1998    Years since quitting: 20.5  . Smokeless tobacco: Never Used  Substance and Sexual Activity  . Alcohol use: No  . Drug use: No  . Sexual activity: Not on file  Lifestyle  . Physical activity    Days per week: Not on file    Minutes per session: Not on file  . Stress: Not on file  Relationships  . Social Musician on phone: Not on file    Gets together: Not on file    Attends religious service: Not on file    Active member of club or organization: Not on file    Attends meetings of clubs or organizations: Not on file    Relationship status: Not on file  . Intimate partner violence    Fear of current or ex partner: Not on file    Emotionally abused: Not on file    Physically abused: Not on file    Forced sexual activity: Not on file  Other Topics Concern  . Not on file  Social History Narrative  . Not on file    Physical Exam      Future Appointments  Date Time Provider Department Center  02/12/2019  9:20 AM Quintella Reichert, MD [facility]  03/17/2019  9:30 AM Maple Hudson, Rennis Chris, MD [facility] None    BP 118/72   Pulse 90   Temp (!) 97 F (36.1 C)   Resp 18   Wt 245 lb (111.1 kg)   SpO2 96%   BMI 38.37 kg/m   Weight yesterday-?? Last visit weight-232  Pt reports he feels ok, however he has not weighed in over a week and today when he weighed today his weight is up 13 lbs in 2wks.  He reports eating a lot of bologna sandwiches. Sometimes a few sandwiches a day. Eating manniches, a lot of diet indiscretions. Also been drinking too much fluids.  He started feeling better and laid off his diet restrictions and has been eating terrible. he did have a couple of days of feeling bloated so that's when he started taking the extra lasix.  He has been taking extra 40mg  at lunch time for the past 2 days--so that equals total of 40mg  three times a day.  When he woke up he wasn't feeling that great now that he thinks about  it. His abd is distended. He denies c/p, no dizziness noted.   Contacted clinic and jasmine is going to speak with amy about the plan of care.  Pt is very understanding of what went wrong with his diet now after reviewing his food and fluid intake with him.   He does own pill box, meds verified and no errors in his  pill box.   -jasmine called me back and advised amy is increasing his lasix to 80mg  BID and sch a lab visit next week.   Marylouise Stacks, [facility] Paramedic  01/01/19

## 2019-01-06 ENCOUNTER — Other Ambulatory Visit (HOSPITAL_COMMUNITY): Payer: Self-pay

## 2019-01-06 NOTE — Progress Notes (Signed)
Paramedicine Encounter    Patient ID: Kristopher [name], male    DOB: 06-Jul-1950, 68 y.o.   MRN: [id number]   Patient Care Team: Maurice Small, MD as PCP - General (Family Medicine) Sueanne Margarita, MD as PCP - Cardiology (Cardiology) Bensimhon, Shaune Pascal, MD as PCP - Advanced Heart Failure (Cardiology) Jorge Ny, LCSW as Social Worker (Licensed Clinical Social Worker)  Patient Active Problem List   Diagnosis Date Noted  . OSA (obstructive sleep apnea) 11/18/2018  . Morbid obesity ([name]) 11/18/2018  . Acute on chronic combined systolic and diastolic congestive heart failure, NYHA class 4 ([name]) 07/21/2018  . PUD (peptic ulcer disease) 08/17/2016  . Acute esophagitis 08/17/2016  . Pulmonary HTN ([name]) 08/17/2016  . Lymphadenopathy 08/17/2016  . CAD (coronary artery disease) 08/16/2016  . Anemia   . DCM (dilated cardiomyopathy) ([name])   . HTN (hypertension)   . Chronic combined systolic and diastolic heart failure ([name]) 08/11/2016  . Acute blood loss anemia 08/11/2016  . Acute upper GI bleed 08/10/2016  . DOE (dyspnea on exertion) 08/10/2016  . LBBB (left bundle branch block) 08/10/2016    Current Outpatient Medications:  .  acetaminophen (TYLENOL) 325 MG tablet, Take 650 mg by mouth every 6 (six) hours as needed., Disp: , Rfl:  .  albuterol (PROVENTIL) (2.5 MG/3ML) 0.083% nebulizer solution, Inhale 3 mLs into the lungs every 6 (six) hours as needed for wheezing or shortness of breath., Disp: , Rfl:  .  aspirin 81 MG chewable tablet, Chew 1 tablet (81 mg total) by mouth daily., Disp: 30 tablet, Rfl: 6 .  atorvastatin (LIPITOR) 40 MG tablet, Take 1 tablet (40 mg total) by mouth daily at 6 PM for 30 days., Disp: 30 tablet, Rfl: 6 .  budesonide-formoterol (SYMBICORT) 80-4.5 MCG/ACT inhaler, Inhale 1 puff into the lungs 2 (two) times daily., Disp: 1 Inhaler, Rfl: 12 .  ferrous sulfate 325 (65 FE) MG tablet, Take 325 mg by mouth daily with breakfast., Disp: , Rfl:  .  furosemide (LASIX) 40 MG  tablet, Take 2 tablets (80 mg total) by mouth 2 (two) times daily., Disp: 60 tablet, Rfl: 3 .  ipratropium (ATROVENT) 0.02 % nebulizer solution, Take 2.5 mLs (0.5 mg total) by nebulization 2 (two) times daily., Disp: 75 mL, Rfl: 12 .  potassium chloride (K-DUR) 10 MEQ tablet, Take 1 tablet (10 mEq total) by mouth daily., Disp: 30 tablet, Rfl: 3 .  sacubitril-valsartan (ENTRESTO) 97-103 MG, Take 1 tablet by mouth 2 (two) times daily., Disp: 60 tablet, Rfl: 6 .  spironolactone (ALDACTONE) 25 MG tablet, Take 1 tablet (25 mg total) by mouth daily., Disp: 30 tablet, Rfl: 6 .  carvedilol (COREG) 6.25 MG tablet, Take 1.5 tablets (9.375 mg total) by mouth 2 (two) times daily with a meal for 30 days., Disp: 270 tablet, Rfl: 3 .  omeprazole (PRILOSEC) 40 MG capsule, Take 40 mg by mouth as needed., Disp: , Rfl:  Allergies  Allergen Reactions  . Penicillins     Pt reports it makes him feel shaky Has patient had a PCN reaction causing immediate rash, facial/tongue/throat swelling, SOB or lightheadedness with hypotension: YES Has patient had a PCN reaction causing severe rash involving mucus membranes or skin necrosis: NO Has patient had a PCN reaction that required hospitalization NO Has patient had a PCN reaction occurring within the last 10 years: NO If all of the above answers are "NO", then may proceed with Cephalosporin use.      Social History  Socioeconomic History  . Marital status: Single    Spouse name: Not on file  . Number of children: Not on file  . Years of education: Not on file  . Highest education level: Not on file  Occupational History  . Not on file  Social Needs  . Financial resource strain: Not on file  . Food insecurity    Worry: Not on file    Inability: Not on file  . Transportation needs    Medical: Not on file    Non-medical: Not on file  Tobacco Use  . Smoking status: Former Smoker    Packs/day: 0.50    Years: 44.00    Pack years: 22.00    Types: Cigarettes     Quit date: 06/11/1998    Years since quitting: 20.5  . Smokeless tobacco: Never Used  Substance and Sexual Activity  . Alcohol use: No  . Drug use: No  . Sexual activity: Not on file  Lifestyle  . Physical activity    Days per week: Not on file    Minutes per session: Not on file  . Stress: Not on file  Relationships  . Social Musician on phone: Not on file    Gets together: Not on file    Attends religious service: Not on file    Active member of club or organization: Not on file    Attends meetings of clubs or organizations: Not on file    Relationship status: Not on file  . Intimate partner violence    Fear of current or ex partner: Not on file    Emotionally abused: Not on file    Physically abused: Not on file    Forced sexual activity: Not on file  Other Topics Concern  . Not on file  Social History Narrative  . Not on file    Physical Exam      Future Appointments  Date Time Provider Department Center  01/07/2019 10:00 AM [facility] None  02/12/2019  9:20 AM Quintella Reichert, MD [facility]  03/17/2019  9:30 AM Maple Hudson, Rennis Chris, MD [facility] None    BP (!) 88/0   Pulse 88   Temp 98.1 F (36.7 C)   Resp 18   Wt 240 lb (108.9 kg)   SpO2 95%   BMI 37.59 kg/m   B/p standing-110/systolic  Weight [id number]  Last visit weight-245  Follow up appointment today from last week, pt had increase in weight due to poor diet decisions. His lasix was changed to 80mg  BID, his weight is down 5lbs from last week. He reports feeling better, good urine output. He has more energy, his abd is less distended. Getting up earlier, he had gotten to where he slept in when his weight was up and now is much better. He has improved his diet again. He is determined to not get like that again and is becoming more aware of symptoms of increased fluid on him. His breathing has improved as well.  Med verified, pill box checked and he was missing a  couple of lasix pills in there--he said he got distracted while filling pill box.  Sleeping much better with CPAP. Using it every night.  B/p on lower side sitting  but he denied any dizziness at all. He states he has not drank water and had his coffee yet this morning.   Kerry Hough, EMT-Paramedic [phone] [facility] Paramedic  01/06/19

## 2019-01-07 ENCOUNTER — Ambulatory Visit (HOSPITAL_COMMUNITY)
Admission: RE | Admit: 2019-01-07 | Discharge: 2019-01-07 | Disposition: A | Discharge status: Home / Self Care | Payer: Medicare Other | Source: Ambulatory Visit | Attending: Cardiology | Admitting: Cardiology

## 2019-01-07 ENCOUNTER — Other Ambulatory Visit: Payer: Self-pay

## 2019-01-07 DIAGNOSIS — I5022 Chronic systolic (congestive) heart failure: Secondary | ICD-10-CM | POA: Insufficient documentation

## 2019-01-07 LAB — BASIC METABOLIC PANEL
Anion gap: 9 (ref 5–15)
BUN: 27 mg/dL — ABNORMAL HIGH (ref 8–23)
CO2: 30 mmol/L (ref 22–32)
Calcium: 9.7 mg/dL (ref 8.9–10.3)
Chloride: 100 mmol/L (ref 98–111)
Creatinine, Ser: 1.49 mg/dL — ABNORMAL HIGH (ref 0.61–1.24)
GFR calc Af Amer: 55 mL/min — ABNORMAL LOW (ref >60)
GFR calc non Af Amer: 48 mL/min — ABNORMAL LOW (ref >60)
Glucose, Bld: 96 mg/dL (ref 70–99)
Potassium: 4.5 mmol/L (ref 3.5–5.1)
Sodium: 139 mmol/L (ref 135–145)

## 2019-01-08 ENCOUNTER — Other Ambulatory Visit (HOSPITAL_COMMUNITY): Payer: Self-pay

## 2019-01-08 MED ORDER — ATORVASTATIN CALCIUM 40 MG PO TABS: 40.0000 mg | ORAL_TABLET | Freq: Every day | ORAL | 6 refills | Status: DC

## 2019-01-14 ENCOUNTER — Other Ambulatory Visit (HOSPITAL_COMMUNITY): Payer: Self-pay

## 2019-01-14 NOTE — Progress Notes (Addendum)
Paramedicine Encounter    Patient ID: Kristopher [name], male    DOB: 03/31/1951, 68 y.o.   MRN: [id number]   Patient Care Team: Shirlean Mylar, MD as PCP - General (Family Medicine) Quintella Reichert, MD as PCP - Cardiology (Cardiology) Bensimhon, Bevelyn Buckles, MD as PCP - Advanced Heart Failure (Cardiology) [name], LCSW as Social Worker (Licensed Clinical Social Worker)  Patient Active Problem List   Diagnosis Date Noted  . OSA (obstructive sleep apnea) 11/18/2018  . Morbid obesity (HCC) 11/18/2018  . Acute on chronic combined systolic and diastolic congestive heart failure, NYHA class 4 (HCC) 07/21/2018  . PUD (peptic ulcer disease) 08/17/2016  . Acute esophagitis 08/17/2016  . Pulmonary HTN (HCC) 08/17/2016  . Lymphadenopathy 08/17/2016  . CAD (coronary artery disease) 08/16/2016  . Anemia   . DCM (dilated cardiomyopathy) (HCC)   . HTN (hypertension)   . Chronic combined systolic and diastolic heart failure (HCC) 08/11/2016  . Acute blood loss anemia 08/11/2016  . Acute upper GI bleed 08/10/2016  . DOE (dyspnea on exertion) 08/10/2016  . LBBB (left bundle branch block) 08/10/2016    Current Outpatient Medications:  .  acetaminophen (TYLENOL) 325 MG tablet, Take 650 mg by mouth every 6 (six) hours as needed., Disp: , Rfl:  .  albuterol (PROVENTIL) (2.5 MG/3ML) 0.083% nebulizer solution, Inhale 3 mLs into the lungs every 6 (six) hours as needed for wheezing or shortness of breath., Disp: , Rfl:  .  aspirin 81 MG chewable tablet, Chew 1 tablet (81 mg total) by mouth daily., Disp: 30 tablet, Rfl: 6 .  atorvastatin (LIPITOR) 40 MG tablet, Take 1 tablet (40 mg total) by mouth daily at 6 PM., Disp: 30 tablet, Rfl: 6 .  budesonide-formoterol (SYMBICORT) 80-4.5 MCG/ACT inhaler, Inhale 1 puff into the lungs 2 (two) times daily., Disp: 1 Inhaler, Rfl: 12 .  ferrous sulfate 325 (65 FE) MG tablet, Take 325 mg by mouth daily with breakfast., Disp: , Rfl:  .  furosemide (LASIX) 40 MG tablet, Take  2 tablets (80 mg total) by mouth 2 (two) times daily., Disp: 60 tablet, Rfl: 3 .  omeprazole (PRILOSEC) 40 MG capsule, Take 40 mg by mouth as needed., Disp: , Rfl:  .  potassium chloride (K-DUR) 10 MEQ tablet, Take 1 tablet (10 mEq total) by mouth daily., Disp: 30 tablet, Rfl: 3 .  sacubitril-valsartan (ENTRESTO) 97-103 MG, Take 1 tablet by mouth 2 (two) times daily., Disp: 60 tablet, Rfl: 6 .  spironolactone (ALDACTONE) 25 MG tablet, Take 1 tablet (25 mg total) by mouth daily., Disp: 30 tablet, Rfl: 6 .  carvedilol (COREG) 6.25 MG tablet, Take 1.5 tablets (9.375 mg total) by mouth 2 (two) times daily with a meal for 30 days., Disp: 270 tablet, Rfl: 3 .  ipratropium (ATROVENT) 0.02 % nebulizer solution, Take 2.5 mLs (0.5 mg total) by nebulization 2 (two) times daily. (Patient not taking: Reported on 01/14/2019), Disp: 75 mL, Rfl: 12 Allergies  Allergen Reactions  . Penicillins     Pt reports it makes him feel shaky Has patient had a PCN reaction causing immediate rash, facial/tongue/throat swelling, SOB or lightheadedness with hypotension: YES Has patient had a PCN reaction causing severe rash involving mucus membranes or skin necrosis: NO Has patient had a PCN reaction that required hospitalization NO Has patient had a PCN reaction occurring within the last 10 years: NO If all of the above answers are "NO", then may proceed with Cephalosporin use.  Social History   Socioeconomic History  . Marital status: Single    Spouse name: Not on file  . Number of children: Not on file  . Years of education: Not on file  . Highest education level: Not on file  Occupational History  . Not on file  Social Needs  . Financial resource strain: Not on file  . Food insecurity    Worry: Not on file    Inability: Not on file  . Transportation needs    Medical: Not on file    Non-medical: Not on file  Tobacco Use  . Smoking status: Former Smoker    Packs/day: 0.50    Years: 44.00    Pack years:  22.00    Types: Cigarettes    Quit date: 06/11/1998    Years since quitting: 20.6  . Smokeless tobacco: Never Used  Substance and Sexual Activity  . Alcohol use: No  . Drug use: No  . Sexual activity: Not on file  Lifestyle  . Physical activity    Days per week: Not on file    Minutes per session: Not on file  . Stress: Not on file  Relationships  . Social Herbalist on phone: Not on file    Gets together: Not on file    Attends religious service: Not on file    Active member of club or organization: Not on file    Attends meetings of clubs or organizations: Not on file    Relationship status: Not on file  . Intimate partner violence    Fear of current or ex partner: Not on file    Emotionally abused: Not on file    Physically abused: Not on file    Forced sexual activity: Not on file  Other Topics Concern  . Not on file  Social History Narrative  . Not on file    Physical Exam      Future Appointments  Date Time Provider [place]  02/12/2019  9:20 AM Sueanne Margarita, MD [facility]  03/17/2019  9:30 AM Annamaria Boots, Kasandra Knudsen, MD [facility] None    BP 104/70   Pulse 88   Temp 97.9 F (36.6 C)   Resp 18   Wt 241 lb (109.3 kg)   SpO2 97%   BMI 37.75 kg/m   B/p standing-114/70  Weight yesterday-241 Last visit weight-240  Pt states he is doing ok, he did wake up this morning with his left arm numb, he was laying on it and thinks he just slept on it wrong causing him to feel that way. He did not have any c/p, no sob or any other complaints. it did go away and has not returned. I advised him to call 911 should that happen again or it does not go away.  Pt denies dizziness, no c/p.  He did have episode of sob yesterday morning when he woke up but felt much better after he used his breathing treatment. meds verified. He has not filled pill box up yet, today was his last day and will do it today.   He is still getting bills from home  health for his CPAP and now his neb machine. His daughter is looking into but unable to reach anyone there. She is still trying.  Ordered his albuterol for refills.  Orthostatics done today-no dizziness, b/p as noted.    Marylouise Stacks, [facility] Paramedic  01/14/19

## 2019-02-05 ENCOUNTER — Other Ambulatory Visit (HOSPITAL_COMMUNITY): Payer: Self-pay

## 2019-02-05 NOTE — Progress Notes (Signed)
Came out today to assist with entresto application and pt was able to pick up his pharmacy co-pay report and it was turned in.   Marylouise Stacks, EMT-Paramedic  02/05/19

## 2019-02-05 NOTE — Progress Notes (Addendum)
Paramedicine Encounter    Patient ID: Kristopher [name], male    DOB: Oct 28, 1950, 68 y.o.   MRN: [id number]   Patient Care Team: Shirlean MylarWebb, Carol, MD as PCP - General (Family Medicine) Quintella Reicherturner, Traci R, MD as PCP - Cardiology (Cardiology) Bensimhon, Bevelyn Bucklesaniel R, MD as PCP - Advanced Heart Failure (Cardiology) Burna SisUris, Jenna H, LCSW as Social Worker (Licensed Clinical Social Worker)  Patient Active Problem List   Diagnosis Date Noted  . OSA (obstructive sleep apnea) 11/18/2018  . Morbid obesity (HCC) 11/18/2018  . Acute on chronic combined systolic and diastolic congestive heart failure, NYHA class 4 (HCC) 07/21/2018  . PUD (peptic ulcer disease) 08/17/2016  . Acute esophagitis 08/17/2016  . Pulmonary HTN (HCC) 08/17/2016  . Lymphadenopathy 08/17/2016  . CAD (coronary artery disease) 08/16/2016  . Anemia   . DCM (dilated cardiomyopathy) (HCC)   . HTN (hypertension)   . Chronic combined systolic and diastolic heart failure (HCC) 08/11/2016  . Acute blood loss anemia 08/11/2016  . Acute upper GI bleed 08/10/2016  . DOE (dyspnea on exertion) 08/10/2016  . LBBB (left bundle branch block) 08/10/2016    Current Outpatient Medications:  .  acetaminophen (TYLENOL) 325 MG tablet, Take 650 mg by mouth every 6 (six) hours as needed., Disp: , Rfl:  .  albuterol (PROVENTIL) (2.5 MG/3ML) 0.083% nebulizer solution, Inhale 3 mLs into the lungs every 6 (six) hours as needed for wheezing or shortness of breath., Disp: , Rfl:  .  aspirin 81 MG chewable tablet, Chew 1 tablet (81 mg total) by mouth daily., Disp: 30 tablet, Rfl: 6 .  atorvastatin (LIPITOR) 40 MG tablet, Take 1 tablet (40 mg total) by mouth daily at 6 PM., Disp: 30 tablet, Rfl: 6 .  budesonide-formoterol (SYMBICORT) 80-4.5 MCG/ACT inhaler, Inhale 1 puff into the lungs 2 (two) times daily., Disp: 1 Inhaler, Rfl: 12 .  ferrous sulfate 325 (65 FE) MG tablet, Take 325 mg by mouth daily with breakfast., Disp: , Rfl:  .  furosemide (LASIX) 40 MG tablet, Take  2 tablets (80 mg total) by mouth 2 (two) times daily., Disp: 60 tablet, Rfl: 3 .  ipratropium (ATROVENT) 0.02 % nebulizer solution, Take 2.5 mLs (0.5 mg total) by nebulization 2 (two) times daily., Disp: 75 mL, Rfl: 12 .  omeprazole (PRILOSEC) 40 MG capsule, Take 40 mg by mouth as needed., Disp: , Rfl:  .  potassium chloride (K-DUR) 10 MEQ tablet, Take 1 tablet (10 mEq total) by mouth daily., Disp: 30 tablet, Rfl: 3 .  sacubitril-valsartan (ENTRESTO) 97-103 MG, Take 1 tablet by mouth 2 (two) times daily., Disp: 60 tablet, Rfl: 6 .  spironolactone (ALDACTONE) 25 MG tablet, Take 1 tablet (25 mg total) by mouth daily., Disp: 30 tablet, Rfl: 6 .  carvedilol (COREG) 6.25 MG tablet, Take 1.5 tablets (9.375 mg total) by mouth 2 (two) times daily with a meal for 30 days., Disp: 270 tablet, Rfl: 3 Allergies  Allergen Reactions  . Penicillins     Pt reports it makes him feel shaky Has patient had a PCN reaction causing immediate rash, facial/tongue/throat swelling, SOB or lightheadedness with hypotension: YES Has patient had a PCN reaction causing severe rash involving mucus membranes or skin necrosis: NO Has patient had a PCN reaction that required hospitalization NO Has patient had a PCN reaction occurring within the last 10 years: NO If all of the above answers are "NO", then may proceed with Cephalosporin use.      Social History   Socioeconomic History  .  Marital status: Single    Spouse name: Not on file  . Number of children: Not on file  . Years of education: Not on file  . Highest education level: Not on file  Occupational History  . Not on file  Social Needs  . Financial resource strain: Not on file  . Food insecurity    Worry: Not on file    Inability: Not on file  . Transportation needs    Medical: Not on file    Non-medical: Not on file  Tobacco Use  . Smoking status: Former Smoker    Packs/day: 0.50    Years: 44.00    Pack years: 22.00    Types: Cigarettes    Quit date:  06/11/1998    Years since quitting: 20.6  . Smokeless tobacco: Never Used  Substance and Sexual Activity  . Alcohol use: No  . Drug use: No  . Sexual activity: Not on file  Lifestyle  . Physical activity    Days per week: Not on file    Minutes per session: Not on file  . Stress: Not on file  Relationships  . Social Herbalist on phone: Not on file    Gets together: Not on file    Attends religious service: Not on file    Active member of club or organization: Not on file    Attends meetings of clubs or organizations: Not on file    Relationship status: Not on file  . Intimate partner violence    Fear of current or ex partner: Not on file    Emotionally abused: Not on file    Physically abused: Not on file    Forced sexual activity: Not on file  Other Topics Concern  . Not on file  Social History Narrative  . Not on file    Physical Exam      Future Appointments  Date Time Provider [place]  02/12/2019  9:20 AM Sueanne Margarita, MD [facility]  03/17/2019  9:30 AM Deneise Lever, MD [facility] None    BP (!) 90/52   Pulse 98   Temp 97.9 F (36.6 C)   Resp 18   Wt 237 lb (107.5 kg)   SpO2 96%   BMI 37.12 kg/m  B/p standing-104/systolic  Weight PIRJJOACZ-660 Last visit weight-241  Pt had follow up with PCP yesterday, dr Justin Mend. He says it was a good visit, no med changes.  Pt reports he is doing well. Pts weight is continuing to come  down. He has modified his diet and fluid intake.  Pt was up cleaning the apt when I arrived, he denies any sob in doing that. No dizziness, no c/p, no h/a.   He ran out of the 97-103 entresto and he had found the 49-51 on Sunday so the past few days he has been taking only 49-51 BID. I called pharmacy to get the 97-103 filled.  Checked pill box and it was correct in there except the entresto--I added an additional tablet for each dose so he can get back on the correct strength.  Pharmacy said it  would be $178 for co-pay. Will have to get him to go to his pharmacy to get print out of his co-pay costs for the year for entresto assistance. He states he will get it today and I will try to get the entresto app and bring back to him to sign to turn in.  No edema noted. Lungs clear.  Kerry Hough, EMT-Paramedic [phone] [facility] Paramedic  02/05/19

## 2019-02-11 ENCOUNTER — Telehealth (HOSPITAL_COMMUNITY): Payer: Self-pay | Admitting: Pharmacy Technician

## 2019-02-11 ENCOUNTER — Telehealth: Payer: Self-pay

## 2019-02-11 NOTE — Telephone Encounter (Signed)
Patient completed an application for [employer] Patient [place] (NPAF) in an effort to reduce the patient's out of pocket expense for Entresto to $0.    Application completed and faxed to [phone].   NPAF phone number for follow up is [phone].   This encounter will be updated until final determination.   Charlann Boxer, CPhT

## 2019-02-11 NOTE — Progress Notes (Signed)
Virtual Visit via Telephone Note   This visit type was conducted due to national recommendations for restrictions regarding the COVID-19 Pandemic (e.g. social distancing) in an effort to limit this patient's exposure and mitigate transmission in our community.  Due to his co-morbid illnesses, this patient is at least at moderate risk for complications without adequate follow up.  This format is felt to be most appropriate for this patient at this time.  The patient did not have access to video technology/had technical difficulties with video requiring transitioning to audio format only (telephone).  All issues noted in this document were discussed and addressed.  No physical exam could be performed with this format.  Please refer to the patient's chart for his  consent to telehealth for [facility].   Evaluation Performed:  Follow-up visit  This visit type was conducted due to national recommendations for restrictions regarding the COVID-19 Pandemic (e.g. social distancing).  This format is felt to be most appropriate for this patient at this time.  All issues noted in this document were discussed and addressed.  No physical exam was performed (except for noted visual exam findings with Video Visits).  Please refer to the patient's chart (MyChart message for video visits and phone note for telephone visits) for the patient's consent to telehealth for [facility].  Date:  02/12/2019   ID:  Kristopher [name], DOB [birth year], MRN [id number]  Patient Location:  Home  Provider location:   [place]  PCP:  Shirlean MylarWebb, Carol, MD  Cardiologist:  Nicholes Mangoan Bensimhon, MD Sleep Medicine:  Armanda Magicraci Aidynn Polendo, MD Electrophysiologist:  None   Chief Complaint:  OSA  History of Present Illness:    Kristopher [name] is a 68 y.o. male who presents via audio/video conferencing for a telehealth visit today.    This is a 68yo male with a hx of chronic combined systolic/diastolic CHF, CAD, HTN and severe OSA.  He has been  followed by Dr. Gala RomneyBensimhon for his CHF and has an EF of 25-30%.  After hospitalizatoin in Feb 2020 for CHF he became O2 dependent for a while but is now off O2.  He underwent sleep study 10/2018 showing [facility].  He is now on CPAP.  There have been some problems with getting his own CPAP unit.  He was initially discharged from hospital to SNF and was sent with the hospital unit and then could not get one from his DME but that has all resolved.    He is doing well with his CPAP device and thinks that he has gotten used to it.  He tolerates the mask and feels the pressure is adequate.  Since going on CPAP he feels rested in the am and has no significant daytime sleepiness.  He denies any significant mouth or nasal dryness or nasal congestion.  He does not think that he snores.    The patient does not have symptoms concerning for COVID-19 infection (fever, chills, cough, or new shortness of breath).   Prior CV studies:   The following studies were reviewed today:  PAP compliance download  Past Medical History:  Diagnosis Date  . Acute combined systolic and diastolic heart failure (HCC) 08/11/2016  . Acute esophagitis 08/17/2016  . Acute upper GI bleed 08/10/2016  . Asthma   . CAD (coronary artery disease) 08/16/2016  . CHF (congestive heart failure) (HCC)   . DCM (dilated cardiomyopathy) (HCC)   . Dyspnea   . HTN (hypertension)   . LBBB (left  bundle branch block) 08/10/2016  . Lymphadenopathy 08/17/2016  . OSA (obstructive sleep apnea) 11/18/2018   [place] home sleep study revealed Severe Obstructive Sleep Apnea with AHI 65.8/hr. Central sleep apnea was noted with an pAHIc of 11.4/hr. % Cheyne Stokes respirations was 10.3%. Nocturnal Hypoxemia was noted with O2 saturations as low as 72%. Time spent with Oxygen desaturations < 88% was 97.6 minutes.  . PUD (peptic ulcer disease) 08/17/2016  . Pulmonary HTN (HCC) 08/17/2016   Past Surgical History:  Procedure Laterality Date  .  ESOPHAGOGASTRODUODENOSCOPY (EGD) WITH PROPOFOL Left 08/11/2016   Procedure: ESOPHAGOGASTRODUODENOSCOPY (EGD) WITH PROPOFOL;  Surgeon: Willis Modena, MD;  Location: [facility] ENDOSCOPY;  Service: Endoscopy;  Laterality: Left;  . RIGHT/LEFT HEART CATH AND CORONARY ANGIOGRAPHY N/A 08/15/2016   Procedure: Right/Left Heart Cath and Coronary Angiography;  Surgeon: Corky Crafts, MD;  Location: [facility] INVASIVE CV LAB;  Service: Cardiovascular;  Laterality: N/A;  . RIGHT/LEFT HEART CATH AND CORONARY ANGIOGRAPHY N/A 08/04/2018   Procedure: RIGHT/LEFT HEART CATH AND CORONARY ANGIOGRAPHY;  Surgeon: Dolores Patty, MD;  Location: [facility] INVASIVE CV LAB;  Service: Cardiovascular;  Laterality: N/A;     No outpatient medications have been marked as taking for the 02/12/19 encounter (Telemedicine) with Quintella Reichert, MD.     Allergies:   Penicillins   Social History   Tobacco Use  . Smoking status: Former Smoker    Packs/day: 0.50    Years: 44.00    Pack years: 22.00    Types: Cigarettes    Quit date: 06/11/1998    Years since quitting: 20.6  . Smokeless tobacco: Never Used  Substance Use Topics  . Alcohol use: No  . Drug use: No     Family Hx: The patient's family history includes Hypertension in his mother and sister.  ROS:   Please see the history of present illness.     All other systems reviewed and are negative.   Labs/Other Tests and Data Reviewed:    Recent Labs: 07/30/2018: ALT 13 08/08/2018: Magnesium 2.0 08/13/2018: Hemoglobin 12.0; Platelets 402 11/13/2018: B Natriuretic Peptide 58.8 01/07/2019: BUN 27; Creatinine, Ser 1.49; Potassium 4.5; Sodium 139   Recent Lipid Panel Lab Results  Component Value Date/Time   CHOL 143 08/12/2016 04:37 AM   TRIG 200 (H) 07/26/2018 12:14 PM   HDL 39 (L) 08/12/2016 04:37 AM   CHOLHDL 3.7 08/12/2016 04:37 AM   LDLCALC 89 08/12/2016 04:37 AM    Wt Readings from Last 3 Encounters:  02/12/19 237 lb (107.5 kg)  02/05/19 237 lb (107.5 kg)  01/14/19  241 lb (109.3 kg)     Objective:    Vital Signs:  Pulse 78   Wt 237 lb (107.5 kg)   SpO2 94%   BMI 37.12 kg/m     ASSESSMENT & PLAN:    1.  OSA - The pathophysiology of obstructive sleep apnea , it's cardiovascular consequences & modes of treatment including CPAP were discused with the patient in detail & they evidenced understanding.  The patient is tolerating PAP therapy well without any problems. The PAP download was reviewed today and showed an AHI of 1.6/hr on auto PAP with 100% compliance in using more than 4 hours nightly.  The patient has been using and benefiting from PAP use and will continue to benefit from therapy.   2.  HTN - Continue Entresto, Coreg and spiro.   3.  Mild nonobstructive CAD - noted on [facility] 07/2018.  Continue ASA, lipitor.  Denies any anginal sx  and is very active working out in the yard.   4.  Morbid Obesity - His BMI is >37 with co-morbidities. I have encouraged him to get into a routine exercise program and cut back on carbs and portions.   COVID-19 Education: The signs and symptoms of COVID-19 were discussed with the patient and how to seek care for testing (follow up with PCP or arrange E-visit).  The importance of social distancing was discussed today.  Patient Risk:   After full review of this patient's clinical status, I feel that they are at least moderate risk at this time.  Time:   Today, I have spent 20 minutes directly with the patient on telemedicine discussing medical problems including OSA, CAD, HTN.  We also reviewed the symptoms of COVID 19 and the ways to protect against contracting the virus with telehealth technology.  I spent an additional 5 minutes reviewing patient's chart including PAP compliance download.  Medication Adjustments/Labs and Tests Ordered: Current medicines are reviewed at length with the patient today.  Concerns regarding medicines are outlined above.  Tests Ordered: No orders of the defined types were placed in this  encounter.  Medication Changes: No orders of the defined types were placed in this encounter.   Disposition:  Follow up in 1 year.  Signed, Fransico Him, MD  02/12/2019 9:23 AM    [place]

## 2019-02-11 NOTE — Telephone Encounter (Signed)

## 2019-02-12 ENCOUNTER — Encounter: Payer: Self-pay | Admitting: Cardiology

## 2019-02-12 ENCOUNTER — Other Ambulatory Visit: Payer: Self-pay

## 2019-02-12 ENCOUNTER — Telehealth (INDEPENDENT_AMBULATORY_CARE_PROVIDER_SITE_OTHER): Payer: Medicare Other | Admitting: Cardiology

## 2019-02-12 VITALS — HR 78 | Wt 237.0 lb

## 2019-02-12 DIAGNOSIS — I10 Essential (primary) hypertension: Secondary | ICD-10-CM | POA: Diagnosis not present

## 2019-02-12 DIAGNOSIS — I2510 Atherosclerotic heart disease of native coronary artery without angina pectoris: Secondary | ICD-10-CM | POA: Diagnosis not present

## 2019-02-12 DIAGNOSIS — G4733 Obstructive sleep apnea (adult) (pediatric): Secondary | ICD-10-CM | POA: Diagnosis not present

## 2019-02-12 NOTE — Patient Instructions (Signed)
Medication Instructions:  Your physician recommends that you continue on your current medications as directed. Please refer to the Current Medication list given to you today.  If you need a refill on your cardiac medications before your next appointment, please call your pharmacy.   Lab work: None ordered If you have labs (blood work) drawn today and your tests are completely normal, you will receive your results only by: Marland Kitchen MyChart Message (if you have MyChart) OR . A paper copy in the mail If you have any lab test that is abnormal or we need to change your treatment, we will call you to review the results.  Testing/Procedures: None ordered  Follow-Up: At [facility], you and your health needs are our priority.  As part of our continuing mission to provide you with exceptional heart care, we have created designated Provider Care Teams.  These Care Teams include your primary Cardiologist (physician) and Advanced Practice Providers (APPs -  Physician Assistants and Nurse Practitioners) who all work together to provide you with the care you need, when you need it. You will need a follow up appointment in 1 year.  Please call our office 2 months in advance to schedule this appointment.  You may see Fransico Him, MD or one of the following Advanced Practice Providers on your designated Care Team:   [place], PA-C Melina Copa, PA-C . Ermalinda Barrios, PA-C  Any Other Special Instructions Will Be Listed Below (If Applicable).

## 2019-02-18 ENCOUNTER — Other Ambulatory Visit (HOSPITAL_COMMUNITY): Payer: Self-pay

## 2019-02-18 NOTE — Progress Notes (Signed)
We had scheduled appointment today and pt advised he only had a few days left of the entresto--we filled out and sent in the entresto med assist app, he has not heard back yet. Will f/u when jenna returns next week.  Pt said he was not able to do the whole visit today due to they have been rearranging the apt and things are in a disarray. Since its raining outside we couldn't really do assessment outside today.  He states he is doing much better, following the heart healthy guidelines more strictly and his weight is down to 235lbs.  Will f/u with him next week for regular home visit.  I left him sample of entresto.   Marylouise Stacks, EMT-Paramedic  02/18/19

## 2019-02-26 NOTE — Telephone Encounter (Signed)
Patient was approved to receive Entresto through [employer] patient assistance.   ID: [id number]  Effective Dates: 02/12/2019 through 06/11/2019  They have been unsuccessful in reaching patient. Called and spoke with patient, gave him [employer] number to set up shipment and wished him a happy birthday.  Charlann Boxer, CPhT

## 2019-03-05 ENCOUNTER — Other Ambulatory Visit (HOSPITAL_COMMUNITY): Payer: Self-pay

## 2019-03-05 NOTE — Progress Notes (Signed)
Paramedicine Encounter    Patient ID: Kristopher [name], male    DOB: 08/02/1950, 68 y.o.   MRN: [id number]   Patient Care Team: Maurice Small, MD as PCP - General (Family Medicine) Sueanne Margarita, MD as PCP - Cardiology (Cardiology) Bensimhon, Shaune Pascal, MD as PCP - Advanced Heart Failure (Cardiology) Jorge Ny, LCSW as Social Worker (Licensed Clinical Social Worker)  Patient Active Problem List   Diagnosis Date Noted  . OSA (obstructive sleep apnea) 11/18/2018  . Morbid obesity ([place]) 11/18/2018  . Acute on chronic combined systolic and diastolic congestive heart failure, NYHA class 4 ([place]) 07/21/2018  . PUD (peptic ulcer disease) 08/17/2016  . Acute esophagitis 08/17/2016  . Pulmonary HTN ([place]) 08/17/2016  . Lymphadenopathy 08/17/2016  . CAD (coronary artery disease) 08/16/2016  . Anemia   . DCM (dilated cardiomyopathy) ([place])   . HTN (hypertension)   . Chronic combined systolic and diastolic heart failure ([place]) 08/11/2016  . Acute blood loss anemia 08/11/2016  . Acute upper GI bleed 08/10/2016  . DOE (dyspnea on exertion) 08/10/2016  . LBBB (left bundle branch block) 08/10/2016    Current Outpatient Medications:  .  acetaminophen (TYLENOL) 325 MG tablet, Take 650 mg by mouth every 6 (six) hours as needed., Disp: , Rfl:  .  albuterol (PROVENTIL) (2.5 MG/3ML) 0.083% nebulizer solution, Inhale 3 mLs into the lungs every 6 (six) hours as needed for wheezing or shortness of breath., Disp: , Rfl:  .  aspirin 81 MG chewable tablet, Chew 1 tablet (81 mg total) by mouth daily., Disp: 30 tablet, Rfl: 6 .  budesonide-formoterol (SYMBICORT) 80-4.5 MCG/ACT inhaler, Inhale 1 puff into the lungs 2 (two) times daily., Disp: 1 Inhaler, Rfl: 12 .  ferrous sulfate 325 (65 FE) MG tablet, Take 325 mg by mouth daily with breakfast., Disp: , Rfl:  .  furosemide (LASIX) 40 MG tablet, Take 2 tablets (80 mg total) by mouth 2 (two) times daily., Disp: 60 tablet, Rfl: 3 .  ipratropium (ATROVENT) 0.02 %  nebulizer solution, Take 2.5 mLs (0.5 mg total) by nebulization 2 (two) times daily., Disp: 75 mL, Rfl: 12 .  omeprazole (PRILOSEC) 40 MG capsule, Take 40 mg by mouth as needed., Disp: , Rfl:  .  potassium chloride (K-DUR) 10 MEQ tablet, Take 1 tablet (10 mEq total) by mouth daily., Disp: 30 tablet, Rfl: 3 .  sacubitril-valsartan (ENTRESTO) 97-103 MG, Take 1 tablet by mouth 2 (two) times daily., Disp: 60 tablet, Rfl: 6 .  spironolactone (ALDACTONE) 25 MG tablet, Take 1 tablet (25 mg total) by mouth daily., Disp: 30 tablet, Rfl: 6 .  atorvastatin (LIPITOR) 40 MG tablet, Take 1 tablet (40 mg total) by mouth daily at 6 PM., Disp: 30 tablet, Rfl: 6 .  carvedilol (COREG) 6.25 MG tablet, Take 1.5 tablets (9.375 mg total) by mouth 2 (two) times daily with a meal for 30 days., Disp: 270 tablet, Rfl: 3 Allergies  Allergen Reactions  . Penicillins     Pt reports it makes him feel shaky Has patient had a PCN reaction causing immediate rash, facial/tongue/throat swelling, SOB or lightheadedness with hypotension: YES Has patient had a PCN reaction causing severe rash involving mucus membranes or skin necrosis: NO Has patient had a PCN reaction that required hospitalization NO Has patient had a PCN reaction occurring within the last 10 years: NO If all of the above answers are "NO", then may proceed with Cephalosporin use.      Social History   Socioeconomic History  .  Marital status: Single    Spouse name: Not on file  . Number of children: Not on file  . Years of education: Not on file  . Highest education level: Not on file  Occupational History  . Not on file  Social Needs  . Financial resource strain: Not on file  . Food insecurity    Worry: Not on file    Inability: Not on file  . Transportation needs    Medical: Not on file    Non-medical: Not on file  Tobacco Use  . Smoking status: Former Smoker    Packs/day: 0.50    Years: 44.00    Pack years: 22.00    Types: Cigarettes    Quit  date: 06/11/1998    Years since quitting: 20.7  . Smokeless tobacco: Never Used  Substance and Sexual Activity  . Alcohol use: No  . Drug use: No  . Sexual activity: Not on file  Lifestyle  . Physical activity    Days per week: Not on file    Minutes per session: Not on file  . Stress: Not on file  Relationships  . Social Musician on phone: Not on file    Gets together: Not on file    Attends religious service: Not on file    Active member of club or organization: Not on file    Attends meetings of clubs or organizations: Not on file    Relationship status: Not on file  . Intimate partner violence    Fear of current or ex partner: Not on file    Emotionally abused: Not on file    Physically abused: Not on file    Forced sexual activity: Not on file  Other Topics Concern  . Not on file  Social History Narrative  . Not on file    Physical Exam      Future Appointments  Date Time Provider Department Center  03/17/2019  9:30 AM Jetty Duhamel D, MD [facility] None    BP 112/60   Pulse 92   Temp 97.7 F (36.5 C)   Resp 18   Wt 234 lb (106.1 kg)   SpO2 97%   BMI 36.65 kg/m   Weight yesterday-234 Last visit weight-237  Pt reports he is feeling good. He has moved in with his sister temporarily  due to his previous roommate smoked a lot and he wanted out. He will soon be moving to his sons old apt which was down the road from his previous address.  meds verified pill box checked that he filled- no errors noted.  and He is now getting entresto from the company.  Pt needed symbicort refilled-called it in to [facility] and it needed to be sent to doc for refills-we asked it be sent to PCP dr webb as it was prescribed to him by hospitalists.  Pt denies sob, no c/p, no dizziness, no edema noted.  Will f/u in a few wks.    Kerry Hough, EMT-Paramedic [phone] [facility] Paramedic  03/05/19

## 2019-03-16 ENCOUNTER — Other Ambulatory Visit (HOSPITAL_COMMUNITY): Payer: Self-pay

## 2019-03-16 MED ORDER — SPIRONOLACTONE 25 MG PO TABS: 25.0000 mg | ORAL_TABLET | Freq: Every day | ORAL | 6 refills | Status: DC

## 2019-03-16 MED ORDER — FUROSEMIDE 40 MG PO TABS: 80.0000 mg | ORAL_TABLET | Freq: Two times a day (BID) | ORAL | 3 refills | Status: DC

## 2019-03-17 ENCOUNTER — Ambulatory Visit: Payer: Medicare Other | Admitting: Internal Medicine

## 2019-03-31 ENCOUNTER — Other Ambulatory Visit (HOSPITAL_COMMUNITY): Payer: Self-pay

## 2019-03-31 MED ORDER — FUROSEMIDE 40 MG PO TABS: 80.0000 mg | ORAL_TABLET | Freq: Two times a day (BID) | ORAL | 3 refills | Status: DC

## 2019-03-31 MED ORDER — POTASSIUM CHLORIDE CRYS ER 10 MEQ PO TBCR: 10.0000 meq | EXTENDED_RELEASE_TABLET | Freq: Every day | ORAL | 3 refills | Status: DC

## 2019-04-01 ENCOUNTER — Other Ambulatory Visit (HOSPITAL_COMMUNITY): Payer: Self-pay | Admitting: *Deleted

## 2019-04-01 ENCOUNTER — Other Ambulatory Visit (HOSPITAL_COMMUNITY): Payer: Self-pay

## 2019-04-01 MED ORDER — FUROSEMIDE 40 MG PO TABS: 80.0000 mg | ORAL_TABLET | Freq: Two times a day (BID) | ORAL | 3 refills | Status: DC

## 2019-04-01 NOTE — Progress Notes (Signed)
Paramedicine Encounter    Patient ID: Kristopher [name], male    DOB: 1951/01/04, 68 y.o.   MRN: [id number]   Patient Care Team: Shirlean Mylar, MD as PCP - General (Family Medicine) Quintella Reichert, MD as PCP - Cardiology (Cardiology) Bensimhon, Bevelyn Buckles, MD as PCP - Advanced Heart Failure (Cardiology) [name], LCSW as Social Worker (Licensed Clinical Social Worker)  Patient Active Problem List   Diagnosis Date Noted  . OSA (obstructive sleep apnea) 11/18/2018  . Morbid obesity ([facility]) 11/18/2018  . Acute on chronic combined systolic and diastolic congestive heart failure, NYHA class 4 ([facility]) 07/21/2018  . PUD (peptic ulcer disease) 08/17/2016  . Acute esophagitis 08/17/2016  . Pulmonary HTN ([facility]) 08/17/2016  . Lymphadenopathy 08/17/2016  . CAD (coronary artery disease) 08/16/2016  . Anemia   . DCM (dilated cardiomyopathy) ([facility])   . HTN (hypertension)   . Chronic combined systolic and diastolic heart failure ([facility]) 08/11/2016  . Acute blood loss anemia 08/11/2016  . Acute upper GI bleed 08/10/2016  . DOE (dyspnea on exertion) 08/10/2016  . LBBB (left bundle branch block) 08/10/2016    Current Outpatient Medications:  .  acetaminophen (TYLENOL) 325 MG tablet, Take 650 mg by mouth every 6 (six) hours as needed., Disp: , Rfl:  .  albuterol (PROVENTIL) (2.5 MG/3ML) 0.083% nebulizer solution, Inhale 3 mLs into the lungs every 6 (six) hours as needed for wheezing or shortness of breath., Disp: , Rfl:  .  aspirin 81 MG chewable tablet, Chew 1 tablet (81 mg total) by mouth daily., Disp: 30 tablet, Rfl: 6 .  budesonide-formoterol (SYMBICORT) 80-4.5 MCG/ACT inhaler, Inhale 1 puff into the lungs 2 (two) times daily., Disp: 1 Inhaler, Rfl: 12 .  ferrous sulfate 325 (65 FE) MG tablet, Take 325 mg by mouth daily with breakfast., Disp: , Rfl:  .  ipratropium (ATROVENT) 0.02 % nebulizer solution, Take 2.5 mLs (0.5 mg total) by nebulization 2 (two) times daily., Disp: 75 mL, Rfl: 12 .  omeprazole  (PRILOSEC) 40 MG capsule, Take 40 mg by mouth as needed., Disp: , Rfl:  .  potassium chloride (KLOR-CON) 10 MEQ tablet, Take 1 tablet (10 mEq total) by mouth daily., Disp: 30 tablet, Rfl: 3 .  sacubitril-valsartan (ENTRESTO) 97-103 MG, Take 1 tablet by mouth 2 (two) times daily., Disp: 60 tablet, Rfl: 6 .  spironolactone (ALDACTONE) 25 MG tablet, Take 1 tablet (25 mg total) by mouth daily., Disp: 30 tablet, Rfl: 6 .  atorvastatin (LIPITOR) 40 MG tablet, Take 1 tablet (40 mg total) by mouth daily at 6 PM., Disp: 30 tablet, Rfl: 6 .  carvedilol (COREG) 6.25 MG tablet, Take 1.5 tablets (9.375 mg total) by mouth 2 (two) times daily with a meal for 30 days., Disp: 270 tablet, Rfl: 3 .  furosemide (LASIX) 40 MG tablet, Take 2 tablets (80 mg total) by mouth 2 (two) times daily., Disp: 120 tablet, Rfl: 3 Allergies  Allergen Reactions  . Penicillins     Pt reports it makes him feel shaky Has patient had a PCN reaction causing immediate rash, facial/tongue/throat swelling, SOB or lightheadedness with hypotension: YES Has patient had a PCN reaction causing severe rash involving mucus membranes or skin necrosis: NO Has patient had a PCN reaction that required hospitalization NO Has patient had a PCN reaction occurring within the last 10 years: NO If all of the above answers are "NO", then may proceed with Cephalosporin use.      Social History   Socioeconomic History  .  Marital status: Single    Spouse name: Not on file  . Number of children: Not on file  . Years of education: Not on file  . Highest education level: Not on file  Occupational History  . Not on file  Social Needs  . Financial resource strain: Not on file  . Food insecurity    Worry: Not on file    Inability: Not on file  . Transportation needs    Medical: Not on file    Non-medical: Not on file  Tobacco Use  . Smoking status: Former Smoker    Packs/day: 0.50    Years: 44.00    Pack years: 22.00    Types: Cigarettes     Quit date: 06/11/1998    Years since quitting: 20.8  . Smokeless tobacco: Never Used  Substance and Sexual Activity  . Alcohol use: No  . Drug use: No  . Sexual activity: Not on file  Lifestyle  . Physical activity    Days per week: Not on file    Minutes per session: Not on file  . Stress: Not on file  Relationships  . Social Herbalist on phone: Not on file    Gets together: Not on file    Attends religious service: Not on file    Active member of club or organization: Not on file    Attends meetings of clubs or organizations: Not on file    Relationship status: Not on file  . Intimate partner violence    Fear of current or ex partner: Not on file    Emotionally abused: Not on file    Physically abused: Not on file    Forced sexual activity: Not on file  Other Topics Concern  . Not on file  Social History Narrative  . Not on file    Physical Exam      No future appointments.  BP (!) 92/52   Pulse 92   Temp 97.7 F (36.5 C)   Resp 18   Wt 236 lb (107 kg)   SpO2 94%   BMI 36.96 kg/m   Weight yesterday-236 Last visit weight-234  Pt reports feeling good. He is up a couple pounds due to him eating a lot of bread. He knows to cut back on that but is doing great on the low sodium and limiting his fluids.  He denies c/p, no dizziness. He gets sob at times but uses his inhalers/neb tx and it subsides. He thinks its due to the weather change.  No edema noted.  meds checked--the pharmacy gave him lasix 20mg  with directions of 1 tab daily--they did not have the correct rx on file so I called the clinic to get them to send it in.  Called to refill entresto along with asa.  He fills his own pill box so that was checked. He had errors in there-he had 2 entresto--he had 2 different strengths so he just forgot about it--he said when he takes them from his pill box to take he makes sure they are correct so he said he would have seen the error and not have taken them.   He also said that he is out of his inhaler and the pharmacy was contacting dr webb for refills. I advised him if he doesn't hear back by Friday then to call the doctor office to see about samples.   Marylouise Stacks, [facility] Paramedic  04/02/19

## 2019-04-15 ENCOUNTER — Other Ambulatory Visit (HOSPITAL_COMMUNITY): Payer: Self-pay

## 2019-04-15 NOTE — Progress Notes (Signed)
Paramedicine Encounter    Patient ID: Kristopher [name], male    DOB: [birth year], 68 y.o.   MRN: [id number]   Patient Care Team: Maurice Small, MD as PCP - General (Family Medicine) Sueanne Margarita, MD as PCP - Cardiology (Cardiology) Bensimhon, Shaune Pascal, MD as PCP - Advanced Heart Failure (Cardiology) Jorge Ny, LCSW as Social Worker (Licensed Clinical Social Worker)  Patient Active Problem List   Diagnosis Date Noted  . OSA (obstructive sleep apnea) 11/18/2018  . Morbid obesity ([place]) 11/18/2018  . Acute on chronic combined systolic and diastolic congestive heart failure, NYHA class 4 ([place]) 07/21/2018  . PUD (peptic ulcer disease) 08/17/2016  . Acute esophagitis 08/17/2016  . Pulmonary HTN ([place]) 08/17/2016  . Lymphadenopathy 08/17/2016  . CAD (coronary artery disease) 08/16/2016  . Anemia   . DCM (dilated cardiomyopathy) ([place])   . HTN (hypertension)   . Chronic combined systolic and diastolic heart failure ([place]) 08/11/2016  . Acute blood loss anemia 08/11/2016  . Acute upper GI bleed 08/10/2016  . DOE (dyspnea on exertion) 08/10/2016  . LBBB (left bundle branch block) 08/10/2016    Current Outpatient Medications:  .  acetaminophen (TYLENOL) 325 MG tablet, Take 650 mg by mouth every 6 (six) hours as needed., Disp: , Rfl:  .  albuterol (PROVENTIL) (2.5 MG/3ML) 0.083% nebulizer solution, Inhale 3 mLs into the lungs every 6 (six) hours as needed for wheezing or shortness of breath., Disp: , Rfl:  .  aspirin 81 MG chewable tablet, Chew 1 tablet (81 mg total) by mouth daily., Disp: 30 tablet, Rfl: 6 .  ferrous sulfate 325 (65 FE) MG tablet, Take 325 mg by mouth daily with breakfast., Disp: , Rfl:  .  furosemide (LASIX) 40 MG tablet, Take 2 tablets (80 mg total) by mouth 2 (two) times daily., Disp: 120 tablet, Rfl: 3 .  ipratropium (ATROVENT) 0.02 % nebulizer solution, Take 2.5 mLs (0.5 mg total) by nebulization 2 (two) times daily., Disp: 75 mL, Rfl: 12 .  omeprazole (PRILOSEC) 40 MG  capsule, Take 40 mg by mouth as needed., Disp: , Rfl:  .  potassium chloride (KLOR-CON) 10 MEQ tablet, Take 1 tablet (10 mEq total) by mouth daily., Disp: 30 tablet, Rfl: 3 .  sacubitril-valsartan (ENTRESTO) 97-103 MG, Take 1 tablet by mouth 2 (two) times daily., Disp: 60 tablet, Rfl: 6 .  spironolactone (ALDACTONE) 25 MG tablet, Take 1 tablet (25 mg total) by mouth daily., Disp: 30 tablet, Rfl: 6 .  atorvastatin (LIPITOR) 40 MG tablet, Take 1 tablet (40 mg total) by mouth daily at 6 PM., Disp: 30 tablet, Rfl: 6 .  budesonide-formoterol (SYMBICORT) 80-4.5 MCG/ACT inhaler, Inhale 1 puff into the lungs 2 (two) times daily. (Patient not taking: Reported on 04/15/2019), Disp: 1 Inhaler, Rfl: 12 .  carvedilol (COREG) 6.25 MG tablet, Take 1.5 tablets (9.375 mg total) by mouth 2 (two) times daily with a meal for 30 days., Disp: 270 tablet, Rfl: 3 Allergies  Allergen Reactions  . Penicillins     Pt reports it makes him feel shaky Has patient had a PCN reaction causing immediate rash, facial/tongue/throat swelling, SOB or lightheadedness with hypotension: YES Has patient had a PCN reaction causing severe rash involving mucus membranes or skin necrosis: NO Has patient had a PCN reaction that required hospitalization NO Has patient had a PCN reaction occurring within the last 10 years: NO If all of the above answers are "NO", then may proceed with Cephalosporin use.  Social History   Socioeconomic History  . Marital status: Single    Spouse name: Not on file  . Number of children: Not on file  . Years of education: Not on file  . Highest education level: Not on file  Occupational History  . Not on file  Social Needs  . Financial resource strain: Not on file  . Food insecurity    Worry: Not on file    Inability: Not on file  . Transportation needs    Medical: Not on file    Non-medical: Not on file  Tobacco Use  . Smoking status: Former Smoker    Packs/day: 0.50    Years: 44.00     Pack years: 22.00    Types: Cigarettes    Quit date: 06/11/1998    Years since quitting: 20.8  . Smokeless tobacco: Never Used  Substance and Sexual Activity  . Alcohol use: No  . Drug use: No  . Sexual activity: Not on file  Lifestyle  . Physical activity    Days per week: Not on file    Minutes per session: Not on file  . Stress: Not on file  Relationships  . Social Musician on phone: Not on file    Gets together: Not on file    Attends religious service: Not on file    Active member of club or organization: Not on file    Attends meetings of clubs or organizations: Not on file    Relationship status: Not on file  . Intimate partner violence    Fear of current or ex partner: Not on file    Emotionally abused: Not on file    Physically abused: Not on file    Forced sexual activity: Not on file  Other Topics Concern  . Not on file  Social History Narrative  . Not on file    Physical Exam      No future appointments.  BP (!) 84/70   Pulse 76   Temp (!) 97.5 F (36.4 C)   Resp 18   Wt 235 lb (106.6 kg)   SpO2 96%   BMI 36.81 kg/m   B/p standing-94/64  Weight yesterday-236 Last visit weight-236  Pt reports he is doing ok. He is still out of symbicort still-he reports his phone has been acting up and not sure if pharmacy has called him or not and he hasnt reached back out either. He sounds a bit audible wheezy this morning. He has been using his neb tx to TID since he is out of symbicort.  Called pharmacy and it is $97 for one month since he is in the donut hole--advised him to call dr webb for samples of the symbicort.  Lungs wheezy to the left side he denies sob at this time.  He denies dizziness, no c/p, no issues or concerns at this time. No orthostatics.   Kerry Hough, EMT-Paramedic [phone] [facility] Paramedic  04/15/19

## 2019-04-16 ENCOUNTER — Telehealth (HOSPITAL_COMMUNITY): Payer: Self-pay | Admitting: Licensed Clinical Social Worker

## 2019-04-16 NOTE — Telephone Encounter (Signed)
CSW informed by [employer] that pt having some trouble paying for medications and wondering about applying for Extra Help program.  CSW called pt and completed application with him over the phone- application submitted  CSW will continue to follow and assist as needed  Jorge Ny, [facility] Desk#: [phone] Cell#: [phone]

## 2019-04-28 ENCOUNTER — Other Ambulatory Visit (HOSPITAL_COMMUNITY): Payer: Self-pay

## 2019-04-28 NOTE — Progress Notes (Signed)
Paramedicine Encounter    Patient ID: Kristopher [name], male    DOB: 1950-06-22, 68 y.o.   MRN: [id number]   Patient Care Team: Maurice Small, MD as PCP - General (Family Medicine) Sueanne Margarita, MD as PCP - Cardiology (Cardiology) Bensimhon, Shaune Pascal, MD as PCP - Advanced Heart Failure (Cardiology) Jorge Ny, LCSW as Social Worker (Licensed Clinical Social Worker)  Patient Active Problem List   Diagnosis Date Noted  . OSA (obstructive sleep apnea) 11/18/2018  . Morbid obesity ([place]) 11/18/2018  . Acute on chronic combined systolic and diastolic congestive heart failure, NYHA class 4 ([place]) 07/21/2018  . PUD (peptic ulcer disease) 08/17/2016  . Acute esophagitis 08/17/2016  . Pulmonary HTN ([place]) 08/17/2016  . Lymphadenopathy 08/17/2016  . CAD (coronary artery disease) 08/16/2016  . Anemia   . DCM (dilated cardiomyopathy) ([place])   . HTN (hypertension)   . Chronic combined systolic and diastolic heart failure ([place]) 08/11/2016  . Acute blood loss anemia 08/11/2016  . Acute upper GI bleed 08/10/2016  . DOE (dyspnea on exertion) 08/10/2016  . LBBB (left bundle branch block) 08/10/2016    Current Outpatient Medications:  .  acetaminophen (TYLENOL) 325 MG tablet, Take 650 mg by mouth every 6 (six) hours as needed., Disp: , Rfl:  .  albuterol (PROVENTIL) (2.5 MG/3ML) 0.083% nebulizer solution, Inhale 3 mLs into the lungs every 6 (six) hours as needed for wheezing or shortness of breath., Disp: , Rfl:  .  aspirin 81 MG chewable tablet, Chew 1 tablet (81 mg total) by mouth daily., Disp: 30 tablet, Rfl: 6 .  budesonide-formoterol (SYMBICORT) 80-4.5 MCG/ACT inhaler, Inhale 1 puff into the lungs 2 (two) times daily., Disp: 1 Inhaler, Rfl: 12 .  ferrous sulfate 325 (65 FE) MG tablet, Take 325 mg by mouth daily with breakfast., Disp: , Rfl:  .  furosemide (LASIX) 40 MG tablet, Take 2 tablets (80 mg total) by mouth 2 (two) times daily., Disp: 120 tablet, Rfl: 3 .  ipratropium (ATROVENT) 0.02 %  nebulizer solution, Take 2.5 mLs (0.5 mg total) by nebulization 2 (two) times daily., Disp: 75 mL, Rfl: 12 .  omeprazole (PRILOSEC) 40 MG capsule, Take 40 mg by mouth as needed., Disp: , Rfl:  .  potassium chloride (KLOR-CON) 10 MEQ tablet, Take 1 tablet (10 mEq total) by mouth daily., Disp: 30 tablet, Rfl: 3 .  sacubitril-valsartan (ENTRESTO) 97-103 MG, Take 1 tablet by mouth 2 (two) times daily., Disp: 60 tablet, Rfl: 6 .  spironolactone (ALDACTONE) 25 MG tablet, Take 1 tablet (25 mg total) by mouth daily., Disp: 30 tablet, Rfl: 6 .  atorvastatin (LIPITOR) 40 MG tablet, Take 1 tablet (40 mg total) by mouth daily at 6 PM., Disp: 30 tablet, Rfl: 6 .  carvedilol (COREG) 6.25 MG tablet, Take 1.5 tablets (9.375 mg total) by mouth 2 (two) times daily with a meal for 30 days., Disp: 270 tablet, Rfl: 3 Allergies  Allergen Reactions  . Penicillins     Pt reports it makes him feel shaky Has patient had a PCN reaction causing immediate rash, facial/tongue/throat swelling, SOB or lightheadedness with hypotension: YES Has patient had a PCN reaction causing severe rash involving mucus membranes or skin necrosis: NO Has patient had a PCN reaction that required hospitalization NO Has patient had a PCN reaction occurring within the last 10 years: NO If all of the above answers are "NO", then may proceed with Cephalosporin use.      Social History   Socioeconomic History  .  Marital status: Single    Spouse name: Not on file  . Number of children: Not on file  . Years of education: Not on file  . Highest education level: Not on file  Occupational History  . Not on file  Social Needs  . Financial resource strain: Not on file  . Food insecurity    Worry: Not on file    Inability: Not on file  . Transportation needs    Medical: Not on file    Non-medical: Not on file  Tobacco Use  . Smoking status: Former Smoker    Packs/day: 0.50    Years: 44.00    Pack years: 22.00    Types: Cigarettes     Quit date: 06/11/1998    Years since quitting: 20.8  . Smokeless tobacco: Never Used  Substance and Sexual Activity  . Alcohol use: No  . Drug use: No  . Sexual activity: Not on file  Lifestyle  . Physical activity    Days per week: Not on file    Minutes per session: Not on file  . Stress: Not on file  Relationships  . Social Herbalist on phone: Not on file    Gets together: Not on file    Attends religious service: Not on file    Active member of club or organization: Not on file    Attends meetings of clubs or organizations: Not on file    Relationship status: Not on file  . Intimate partner violence    Fear of current or ex partner: Not on file    Emotionally abused: Not on file    Physically abused: Not on file    Forced sexual activity: Not on file  Other Topics Concern  . Not on file  Social History Narrative  . Not on file    Physical Exam      No future appointments.  BP 126/64   Pulse 80   Temp 98 F (36.7 C)   Resp 18   Wt 237 lb (107.5 kg)   SpO2 98%   BMI 37.12 kg/m   Weight yesterday-237 Last visit weight-235  Pt reports he is doing ok.  He is planning on moving with his cousin in the next week or so. ([place])  Pt states he had to get the money scrounged up to get his inhaler, the doc office didn't have any samples and it costs him $91. He is in the donut hole. He thinks its where the pharmacy auto filled a bunch of his old meds and he just picked up whatever they had ready for him, not realizing it was the wrong dosage.  Breathing is doing good. No c/p, no dizziness.  Weight up from last time, I also warned him with thanksgiving next week and to be careful with how much and what he eats.  No edema noted.   Marylouise Stacks, [facility] Paramedic  04/28/19

## 2019-05-20 ENCOUNTER — Other Ambulatory Visit (HOSPITAL_COMMUNITY): Payer: Self-pay

## 2019-05-20 NOTE — Progress Notes (Signed)
Paramedicine Encounter    Patient ID: Kristopher [name], male    DOB: 08-03-1950, 68 y.o.   MRN: [id number]   Patient Care Team: Maurice Small, MD as PCP - General (Family Medicine) Sueanne Margarita, MD as PCP - Cardiology (Cardiology) Bensimhon, Shaune Pascal, MD as PCP - Advanced Heart Failure (Cardiology) Jorge Ny, LCSW as Social Worker (Licensed Clinical Social Worker)  Patient Active Problem List   Diagnosis Date Noted  . OSA (obstructive sleep apnea) 11/18/2018  . Morbid obesity ([place]) 11/18/2018  . Acute on chronic combined systolic and diastolic congestive heart failure, NYHA class 4 ([place]) 07/21/2018  . PUD (peptic ulcer disease) 08/17/2016  . Acute esophagitis 08/17/2016  . Pulmonary HTN ([place]) 08/17/2016  . Lymphadenopathy 08/17/2016  . CAD (coronary artery disease) 08/16/2016  . Anemia   . DCM (dilated cardiomyopathy) ([place])   . HTN (hypertension)   . Chronic combined systolic and diastolic heart failure ([place]) 08/11/2016  . Acute blood loss anemia 08/11/2016  . Acute upper GI bleed 08/10/2016  . DOE (dyspnea on exertion) 08/10/2016  . LBBB (left bundle branch block) 08/10/2016    Current Outpatient Medications:  .  acetaminophen (TYLENOL) 325 MG tablet, Take 650 mg by mouth every 6 (six) hours as needed., Disp: , Rfl:  .  albuterol (PROVENTIL) (2.5 MG/3ML) 0.083% nebulizer solution, Inhale 3 mLs into the lungs every 6 (six) hours as needed for wheezing or shortness of breath., Disp: , Rfl:  .  aspirin 81 MG chewable tablet, Chew 1 tablet (81 mg total) by mouth daily., Disp: 30 tablet, Rfl: 6 .  budesonide-formoterol (SYMBICORT) 80-4.5 MCG/ACT inhaler, Inhale 1 puff into the lungs 2 (two) times daily., Disp: 1 Inhaler, Rfl: 12 .  ferrous sulfate 325 (65 FE) MG tablet, Take 325 mg by mouth daily with breakfast., Disp: , Rfl:  .  furosemide (LASIX) 40 MG tablet, Take 2 tablets (80 mg total) by mouth 2 (two) times daily., Disp: 120 tablet, Rfl: 3 .  ipratropium (ATROVENT) 0.02 %  nebulizer solution, Take 2.5 mLs (0.5 mg total) by nebulization 2 (two) times daily., Disp: 75 mL, Rfl: 12 .  omeprazole (PRILOSEC) 40 MG capsule, Take 40 mg by mouth as needed., Disp: , Rfl:  .  potassium chloride (KLOR-CON) 10 MEQ tablet, Take 1 tablet (10 mEq total) by mouth daily., Disp: 30 tablet, Rfl: 3 .  sacubitril-valsartan (ENTRESTO) 97-103 MG, Take 1 tablet by mouth 2 (two) times daily., Disp: 60 tablet, Rfl: 6 .  spironolactone (ALDACTONE) 25 MG tablet, Take 1 tablet (25 mg total) by mouth daily., Disp: 30 tablet, Rfl: 6 .  atorvastatin (LIPITOR) 40 MG tablet, Take 1 tablet (40 mg total) by mouth daily at 6 PM., Disp: 30 tablet, Rfl: 6 .  carvedilol (COREG) 6.25 MG tablet, Take 1.5 tablets (9.375 mg total) by mouth 2 (two) times daily with a meal for 30 days., Disp: 270 tablet, Rfl: 3 Allergies  Allergen Reactions  . Penicillins     Pt reports it makes him feel shaky Has patient had a PCN reaction causing immediate rash, facial/tongue/throat swelling, SOB or lightheadedness with hypotension: YES Has patient had a PCN reaction causing severe rash involving mucus membranes or skin necrosis: NO Has patient had a PCN reaction that required hospitalization NO Has patient had a PCN reaction occurring within the last 10 years: NO If all of the above answers are "NO", then may proceed with Cephalosporin use.      Social History   Socioeconomic History  .  Marital status: Single    Spouse name: Not on file  . Number of children: Not on file  . Years of education: Not on file  . Highest education level: Not on file  Occupational History  . Not on file  Social Needs  . Financial resource strain: Not on file  . Food insecurity    Worry: Not on file    Inability: Not on file  . Transportation needs    Medical: Not on file    Non-medical: Not on file  Tobacco Use  . Smoking status: Former Smoker    Packs/day: 0.50    Years: 44.00    Pack years: 22.00    Types: Cigarettes     Quit date: 06/11/1998    Years since quitting: 20.9  . Smokeless tobacco: Never Used  Substance and Sexual Activity  . Alcohol use: No  . Drug use: No  . Sexual activity: Not on file  Lifestyle  . Physical activity    Days per week: Not on file    Minutes per session: Not on file  . Stress: Not on file  Relationships  . Social Musician on phone: Not on file    Gets together: Not on file    Attends religious service: Not on file    Active member of club or organization: Not on file    Attends meetings of clubs or organizations: Not on file    Relationship status: Not on file  . Intimate partner violence    Fear of current or ex partner: Not on file    Emotionally abused: Not on file    Physically abused: Not on file    Forced sexual activity: Not on file  Other Topics Concern  . Not on file  Social History Narrative  . Not on file    Physical Exam      No future appointments.  BP 110/60   Pulse 98   Resp 18   Wt 235 lb (106.6 kg)   SpO2 94%   BMI 36.81 kg/m   Weight yesterday-235 Last visit weight-237  Pt reports feeling good. He states his HR got up 110 when he was doing his moving last week and he did get sob at that time doing that exertion. He moved from his daughters house to his cousins house. He moved last Wednesday.  He denies c/p, no dizziness.  Weight doing good. No edema noted. No bloating noted.  He missed one tablet of lasix in his pill box.  He has to fill his pill box back up  today.  meds verified. He going to get ASA OTC.  Denies any issues or complaints today.   Kerry Hough, EMT-Paramedic [phone] [facility] Paramedic  05/20/19

## 2019-06-10 ENCOUNTER — Other Ambulatory Visit (HOSPITAL_COMMUNITY): Payer: Self-pay

## 2019-06-10 NOTE — Progress Notes (Signed)
Paramedicine Encounter    Patient ID: Kristopher [name], male    DOB: 1950-06-22, 68 y.o.   MRN: [id number]   Patient Care Team: Maurice Small, MD as PCP - General (Family Medicine) Sueanne Margarita, MD as PCP - Cardiology (Cardiology) Bensimhon, Shaune Pascal, MD as PCP - Advanced Heart Failure (Cardiology) Jorge Ny, LCSW as Social Worker (Licensed Clinical Social Worker)  Patient Active Problem List   Diagnosis Date Noted  . OSA (obstructive sleep apnea) 11/18/2018  . Morbid obesity ([place]) 11/18/2018  . Acute on chronic combined systolic and diastolic congestive heart failure, NYHA class 4 ([place]) 07/21/2018  . PUD (peptic ulcer disease) 08/17/2016  . Acute esophagitis 08/17/2016  . Pulmonary HTN ([place]) 08/17/2016  . Lymphadenopathy 08/17/2016  . CAD (coronary artery disease) 08/16/2016  . Anemia   . DCM (dilated cardiomyopathy) ([place])   . HTN (hypertension)   . Chronic combined systolic and diastolic heart failure ([place]) 08/11/2016  . Acute blood loss anemia 08/11/2016  . Acute upper GI bleed 08/10/2016  . DOE (dyspnea on exertion) 08/10/2016  . LBBB (left bundle branch block) 08/10/2016    Current Outpatient Medications:  .  acetaminophen (TYLENOL) 325 MG tablet, Take 650 mg by mouth every 6 (six) hours as needed., Disp: , Rfl:  .  albuterol (PROVENTIL) (2.5 MG/3ML) 0.083% nebulizer solution, Inhale 3 mLs into the lungs every 6 (six) hours as needed for wheezing or shortness of breath., Disp: , Rfl:  .  aspirin 81 MG chewable tablet, Chew 1 tablet (81 mg total) by mouth daily., Disp: 30 tablet, Rfl: 6 .  budesonide-formoterol (SYMBICORT) 80-4.5 MCG/ACT inhaler, Inhale 1 puff into the lungs 2 (two) times daily., Disp: 1 Inhaler, Rfl: 12 .  ferrous sulfate 325 (65 FE) MG tablet, Take 325 mg by mouth daily with breakfast., Disp: , Rfl:  .  furosemide (LASIX) 40 MG tablet, Take 2 tablets (80 mg total) by mouth 2 (two) times daily., Disp: 120 tablet, Rfl: 3 .  ipratropium (ATROVENT) 0.02 %  nebulizer solution, Take 2.5 mLs (0.5 mg total) by nebulization 2 (two) times daily., Disp: 75 mL, Rfl: 12 .  omeprazole (PRILOSEC) 40 MG capsule, Take 40 mg by mouth as needed., Disp: , Rfl:  .  potassium chloride (KLOR-CON) 10 MEQ tablet, Take 1 tablet (10 mEq total) by mouth daily., Disp: 30 tablet, Rfl: 3 .  sacubitril-valsartan (ENTRESTO) 97-103 MG, Take 1 tablet by mouth 2 (two) times daily., Disp: 60 tablet, Rfl: 6 .  spironolactone (ALDACTONE) 25 MG tablet, Take 1 tablet (25 mg total) by mouth daily., Disp: 30 tablet, Rfl: 6 .  atorvastatin (LIPITOR) 40 MG tablet, Take 1 tablet (40 mg total) by mouth daily at 6 PM., Disp: 30 tablet, Rfl: 6 .  carvedilol (COREG) 6.25 MG tablet, Take 1.5 tablets (9.375 mg total) by mouth 2 (two) times daily with a meal for 30 days., Disp: 270 tablet, Rfl: 3 Allergies  Allergen Reactions  . Penicillins     Pt reports it makes him feel shaky Has patient had a PCN reaction causing immediate rash, facial/tongue/throat swelling, SOB or lightheadedness with hypotension: YES Has patient had a PCN reaction causing severe rash involving mucus membranes or skin necrosis: NO Has patient had a PCN reaction that required hospitalization NO Has patient had a PCN reaction occurring within the last 10 years: NO If all of the above answers are "NO", then may proceed with Cephalosporin use.      Social History   Socioeconomic History  .  Marital status: Single    Spouse name: Not on file  . Number of children: Not on file  . Years of education: Not on file  . Highest education level: Not on file  Occupational History  . Not on file  Tobacco Use  . Smoking status: Former Smoker    Packs/day: 0.50    Years: 44.00    Pack years: 22.00    Types: Cigarettes    Quit date: 06/11/1998    Years since quitting: 21.0  . Smokeless tobacco: Never Used  Substance and Sexual Activity  . Alcohol use: No  . Drug use: No  . Sexual activity: Not on file  Other Topics Concern   . Not on file  Social History Narrative  . Not on file   Social Determinants of Health   Financial Resource Strain:   . Difficulty of Paying Living Expenses: Not on file  Food Insecurity:   . Worried About Charity fundraiser in the Last Year: Not on file  . Ran Out of Food in the Last Year: Not on file  Transportation Needs:   . Lack of Transportation (Medical): Not on file  . Lack of Transportation (Non-Medical): Not on file  Physical Activity:   . Days of Exercise per Week: Not on file  . Minutes of Exercise per Session: Not on file  Stress:   . Feeling of Stress : Not on file  Social Connections:   . Frequency of Communication with Friends and Family: Not on file  . Frequency of Social Gatherings with Friends and Family: Not on file  . Attends Religious Services: Not on file  . Active Member of Clubs or Organizations: Not on file  . Attends Archivist Meetings: Not on file  . Marital Status: Not on file  Intimate Partner Violence:   . Fear of Current or Ex-Partner: Not on file  . Emotionally Abused: Not on file  . Physically Abused: Not on file  . Sexually Abused: Not on file    Physical Exam      No future appointments.  BP 90/64 (BP Location: Left Arm, Patient Position: Sitting)   Pulse 78   Temp 97.8 F (36.6 C)   Resp 16   Wt 233 lb (105.7 kg)   SpO2 96%   BMI 36.49 kg/m   B/p-110/systolic   Weight [id number] Last visit weight-235  Pt reports he is feeling good, he denies increased sob. No dizziness. No c/p.  He reports weights are good staying around 232-233.  meds verified and pill box checked.  Using CPAP each night.  Called to change his address on his entresto but after 22 min of waiting I had to hang up. Pt will get his daughter to help him with that this wknd.  He is thinking of moving his meds to [facility] for more convenience plus cheaper costs.  pts b/p increased upon standing. No dizziness. No issues with that.   Marylouise Stacks, [facility] Paramedic  06/10/19

## 2019-06-29 ENCOUNTER — Other Ambulatory Visit (HOSPITAL_COMMUNITY): Payer: Self-pay

## 2019-06-29 MED ORDER — POTASSIUM CHLORIDE CRYS ER 10 MEQ PO TBCR: 10.0000 meq | EXTENDED_RELEASE_TABLET | Freq: Every day | ORAL | 3 refills | Status: DC

## 2019-06-29 NOTE — Progress Notes (Signed)
Paramedicine Encounter    Patient ID: Kristopher [name], male    DOB: 1950-06-22, 69 y.o.   MRN: [id number]   Patient Care Team: Maurice Small, MD as PCP - General (Family Medicine) Sueanne Margarita, MD as PCP - Cardiology (Cardiology) Bensimhon, Shaune Pascal, MD as PCP - Advanced Heart Failure (Cardiology) Jorge Ny, LCSW as Social Worker (Licensed Clinical Social Worker)  Patient Active Problem List   Diagnosis Date Noted  . OSA (obstructive sleep apnea) 11/18/2018  . Morbid obesity ([place]) 11/18/2018  . Acute on chronic combined systolic and diastolic congestive heart failure, NYHA class 4 ([place]) 07/21/2018  . PUD (peptic ulcer disease) 08/17/2016  . Acute esophagitis 08/17/2016  . Pulmonary HTN ([place]) 08/17/2016  . Lymphadenopathy 08/17/2016  . CAD (coronary artery disease) 08/16/2016  . Anemia   . DCM (dilated cardiomyopathy) ([place])   . HTN (hypertension)   . Chronic combined systolic and diastolic heart failure ([place]) 08/11/2016  . Acute blood loss anemia 08/11/2016  . Acute upper GI bleed 08/10/2016  . DOE (dyspnea on exertion) 08/10/2016  . LBBB (left bundle branch block) 08/10/2016    Current Outpatient Medications:  .  acetaminophen (TYLENOL) 325 MG tablet, Take 650 mg by mouth every 6 (six) hours as needed., Disp: , Rfl:  .  albuterol (PROVENTIL) (2.5 MG/3ML) 0.083% nebulizer solution, Inhale 3 mLs into the lungs every 6 (six) hours as needed for wheezing or shortness of breath., Disp: , Rfl:  .  aspirin 81 MG chewable tablet, Chew 1 tablet (81 mg total) by mouth daily., Disp: 30 tablet, Rfl: 6 .  budesonide-formoterol (SYMBICORT) 80-4.5 MCG/ACT inhaler, Inhale 1 puff into the lungs 2 (two) times daily., Disp: 1 Inhaler, Rfl: 12 .  ferrous sulfate 325 (65 FE) MG tablet, Take 325 mg by mouth daily with breakfast., Disp: , Rfl:  .  furosemide (LASIX) 40 MG tablet, Take 2 tablets (80 mg total) by mouth 2 (two) times daily., Disp: 120 tablet, Rfl: 3 .  ipratropium (ATROVENT) 0.02 %  nebulizer solution, Take 2.5 mLs (0.5 mg total) by nebulization 2 (two) times daily., Disp: 75 mL, Rfl: 12 .  omeprazole (PRILOSEC) 40 MG capsule, Take 40 mg by mouth as needed., Disp: , Rfl:  .  potassium chloride (KLOR-CON) 10 MEQ tablet, Take 1 tablet (10 mEq total) by mouth daily., Disp: 30 tablet, Rfl: 3 .  sacubitril-valsartan (ENTRESTO) 97-103 MG, Take 1 tablet by mouth 2 (two) times daily., Disp: 60 tablet, Rfl: 6 .  spironolactone (ALDACTONE) 25 MG tablet, Take 1 tablet (25 mg total) by mouth daily., Disp: 30 tablet, Rfl: 6 .  atorvastatin (LIPITOR) 40 MG tablet, Take 1 tablet (40 mg total) by mouth daily at 6 PM., Disp: 30 tablet, Rfl: 6 .  carvedilol (COREG) 6.25 MG tablet, Take 1.5 tablets (9.375 mg total) by mouth 2 (two) times daily with a meal for 30 days., Disp: 270 tablet, Rfl: 3 Allergies  Allergen Reactions  . Penicillins     Pt reports it makes him feel shaky Has patient had a PCN reaction causing immediate rash, facial/tongue/throat swelling, SOB or lightheadedness with hypotension: YES Has patient had a PCN reaction causing severe rash involving mucus membranes or skin necrosis: NO Has patient had a PCN reaction that required hospitalization NO Has patient had a PCN reaction occurring within the last 10 years: NO If all of the above answers are "NO", then may proceed with Cephalosporin use.      Social History   Socioeconomic History  .  Marital status: Single    Spouse name: Not on file  . Number of children: Not on file  . Years of education: Not on file  . Highest education level: Not on file  Occupational History  . Not on file  Tobacco Use  . Smoking status: Former Smoker    Packs/day: 0.50    Years: 44.00    Pack years: 22.00    Types: Cigarettes    Quit date: 06/11/1998    Years since quitting: 21.0  . Smokeless tobacco: Never Used  Substance and Sexual Activity  . Alcohol use: No  . Drug use: No  . Sexual activity: Not on file  Other Topics Concern   . Not on file  Social History Narrative  . Not on file   Social Determinants of Health   Financial Resource Strain:   . Difficulty of Paying Living Expenses: Not on file  Food Insecurity:   . Worried About Programme researcher, broadcasting/film/video in the Last Year: Not on file  . Ran Out of Food in the Last Year: Not on file  Transportation Needs:   . Lack of Transportation (Medical): Not on file  . Lack of Transportation (Non-Medical): Not on file  Physical Activity:   . Days of Exercise per Week: Not on file  . Minutes of Exercise per Session: Not on file  Stress:   . Feeling of Stress : Not on file  Social Connections:   . Frequency of Communication with Friends and Family: Not on file  . Frequency of Social Gatherings with Friends and Family: Not on file  . Attends Religious Services: Not on file  . Active Member of Clubs or Organizations: Not on file  . Attends Banker Meetings: Not on file  . Marital Status: Not on file  Intimate Partner Violence:   . Fear of Current or Ex-Partner: Not on file  . Emotionally Abused: Not on file  . Physically Abused: Not on file  . Sexually Abused: Not on file    Physical Exam      No future appointments.  BP (!) 84/58 (Patient Position: Sitting)   Pulse 86   Temp 97.8 F (36.6 C)   Resp 18   Wt 234 lb (106.1 kg)   SpO2 96%   BMI 36.65 kg/m   B/p he stood up and sat back down--112/ palpated Same with his home cuff.  Standing-124/palpated  Weight yesterday-234 Last visit weight-233  Pt reports doing ok. He denies sob, no dizziness. No c/p.  Weight stable. He fills his own pill box. That was checked. He had a couple errors in there-he says that happens sometimes but he catches it when he goes to take them. But it was all corrected today-missing carvedilols and too many entresto.  Needs to get entresto, omeprazole and his symbicort refilled. He has moved to [place].  entresto is closed for holiday. Will have to call  back tomor.  Got the omeprazole and symbicort called in to [facility]-he needs to take his new insurance card.  Pt denies any dizziness upon standing up for othorstatic check.  Lungs clear with the exception of the rt lower lobe. But no sob.  He will p/u meds on Wednesday. No edema noted.   Kerry Hough, EMT-Paramedic [phone] [facility] Paramedic  06/29/19

## 2019-07-27 ENCOUNTER — Other Ambulatory Visit (HOSPITAL_COMMUNITY): Payer: Self-pay

## 2019-07-27 NOTE — Progress Notes (Signed)
Paramedicine Encounter    Patient ID: Kristopher [name], male    DOB: 03/31/1951, 69 y.o.   MRN: [id number]   Patient Care Team: Maurice Small, MD as PCP - General (Family Medicine) Sueanne Margarita, MD as PCP - Cardiology (Cardiology) Bensimhon, Shaune Pascal, MD as PCP - Advanced Heart Failure (Cardiology) Jorge Ny, LCSW as Social Worker (Licensed Clinical Social Worker)  Patient Active Problem List   Diagnosis Date Noted  . OSA (obstructive sleep apnea) 11/18/2018  . Morbid obesity ([place]) 11/18/2018  . Acute on chronic combined systolic and diastolic congestive heart failure, NYHA class 4 ([place]) 07/21/2018  . PUD (peptic ulcer disease) 08/17/2016  . Acute esophagitis 08/17/2016  . Pulmonary HTN ([place]) 08/17/2016  . Lymphadenopathy 08/17/2016  . CAD (coronary artery disease) 08/16/2016  . Anemia   . DCM (dilated cardiomyopathy) ([place])   . HTN (hypertension)   . Chronic combined systolic and diastolic heart failure ([place]) 08/11/2016  . Acute blood loss anemia 08/11/2016  . Acute upper GI bleed 08/10/2016  . DOE (dyspnea on exertion) 08/10/2016  . LBBB (left bundle branch block) 08/10/2016    Current Outpatient Medications:  .  acetaminophen (TYLENOL) 325 MG tablet, Take 650 mg by mouth every 6 (six) hours as needed., Disp: , Rfl:  .  albuterol (PROVENTIL) (2.5 MG/3ML) 0.083% nebulizer solution, Inhale 3 mLs into the lungs every 6 (six) hours as needed for wheezing or shortness of breath., Disp: , Rfl:  .  aspirin 81 MG chewable tablet, Chew 1 tablet (81 mg total) by mouth daily., Disp: 30 tablet, Rfl: 6 .  budesonide-formoterol (SYMBICORT) 80-4.5 MCG/ACT inhaler, Inhale 1 puff into the lungs 2 (two) times daily., Disp: 1 Inhaler, Rfl: 12 .  ferrous sulfate 325 (65 FE) MG tablet, Take 325 mg by mouth daily with breakfast., Disp: , Rfl:  .  furosemide (LASIX) 40 MG tablet, Take 2 tablets (80 mg total) by mouth 2 (two) times daily., Disp: 120 tablet, Rfl: 3 .  ipratropium (ATROVENT) 0.02 %  nebulizer solution, Take 2.5 mLs (0.5 mg total) by nebulization 2 (two) times daily., Disp: 75 mL, Rfl: 12 .  omeprazole (PRILOSEC) 40 MG capsule, Take 40 mg by mouth as needed., Disp: , Rfl:  .  potassium chloride (KLOR-CON) 10 MEQ tablet, Take 1 tablet (10 mEq total) by mouth daily., Disp: 30 tablet, Rfl: 3 .  sacubitril-valsartan (ENTRESTO) 97-103 MG, Take 1 tablet by mouth 2 (two) times daily., Disp: 60 tablet, Rfl: 6 .  spironolactone (ALDACTONE) 25 MG tablet, Take 1 tablet (25 mg total) by mouth daily., Disp: 30 tablet, Rfl: 6 .  atorvastatin (LIPITOR) 40 MG tablet, Take 1 tablet (40 mg total) by mouth daily at 6 PM., Disp: 30 tablet, Rfl: 6 .  carvedilol (COREG) 6.25 MG tablet, Take 1.5 tablets (9.375 mg total) by mouth 2 (two) times daily with a meal for 30 days., Disp: 270 tablet, Rfl: 3 Allergies  Allergen Reactions  . Penicillins     Pt reports it makes him feel shaky Has patient had a PCN reaction causing immediate rash, facial/tongue/throat swelling, SOB or lightheadedness with hypotension: YES Has patient had a PCN reaction causing severe rash involving mucus membranes or skin necrosis: NO Has patient had a PCN reaction that required hospitalization NO Has patient had a PCN reaction occurring within the last 10 years: NO If all of the above answers are "NO", then may proceed with Cephalosporin use.      Social History   Socioeconomic History  .  Marital status: Single    Spouse name: Not on file  . Number of children: Not on file  . Years of education: Not on file  . Highest education level: Not on file  Occupational History  . Not on file  Tobacco Use  . Smoking status: Former Smoker    Packs/day: 0.50    Years: 44.00    Pack years: 22.00    Types: Cigarettes    Quit date: 06/11/1998    Years since quitting: 21.1  . Smokeless tobacco: Never Used  Substance and Sexual Activity  . Alcohol use: No  . Drug use: No  . Sexual activity: Not on file  Other Topics Concern   . Not on file  Social History Narrative  . Not on file   Social Determinants of Health   Financial Resource Strain:   . Difficulty of Paying Living Expenses: Not on file  Food Insecurity:   . Worried About Programme researcher, broadcasting/film/video in the Last Year: Not on file  . Ran Out of Food in the Last Year: Not on file  Transportation Needs:   . Lack of Transportation (Medical): Not on file  . Lack of Transportation (Non-Medical): Not on file  Physical Activity:   . Days of Exercise per Week: Not on file  . Minutes of Exercise per Session: Not on file  Stress:   . Feeling of Stress : Not on file  Social Connections:   . Frequency of Communication with Friends and Family: Not on file  . Frequency of Social Gatherings with Friends and Family: Not on file  . Attends Religious Services: Not on file  . Active Member of Clubs or Organizations: Not on file  . Attends Banker Meetings: Not on file  . Marital Status: Not on file  Intimate Partner Violence:   . Fear of Current or Ex-Partner: Not on file  . Emotionally Abused: Not on file  . Physically Abused: Not on file  . Sexually Abused: Not on file    Physical Exam      No future appointments.  BP 130/82   Pulse 90   Temp 97.7 F (36.5 C)   Resp 18   SpO2 97%  B/p standing-124/78 p-99 Weight yesterday-didn't weigh  Last visit weight-234  He reports he had a sleepless night last night. Just waking up when I arrived. He states this whole week he did not weigh himself. He has been taking his meds. No meds this morning yet though.  He needs to fill his pill box this morning so that was empty.  No edema noted. Lungs clear.  He needs to be seen back in clinic soon.  He will call to make that appointment.  V/s taken without meds this morning.   Kerry Hough, EMT-Paramedic [phone] [facility] Paramedic  07/27/19

## 2019-08-07 ENCOUNTER — Other Ambulatory Visit (HOSPITAL_COMMUNITY): Payer: Self-pay | Admitting: Internal Medicine

## 2019-08-11 ENCOUNTER — Telehealth (HOSPITAL_COMMUNITY): Payer: Self-pay | Admitting: Licensed Clinical Social Worker

## 2019-08-11 NOTE — Telephone Encounter (Signed)
CSW called pt to check on status of Extra Help application which CSW had assisted pt in submitting several months ago- unable to reach- left VM requesting he call CSW back  [name], LCSW Clinical Social Worker [facility] Desk#: [phone] Cell#: [phone]

## 2019-08-20 ENCOUNTER — Other Ambulatory Visit (HOSPITAL_COMMUNITY): Payer: Self-pay

## 2019-08-20 NOTE — Progress Notes (Signed)
Paramedicine Encounter    Patient ID: Kristopher [name], male    DOB: 09/03/1950, 69 y.o.   MRN: [id number]   Patient Care Team: Shirlean Mylar, MD as PCP - General (Family Medicine) Quintella Reichert, MD as PCP - Cardiology (Cardiology) Bensimhon, Bevelyn Buckles, MD as PCP - Advanced Heart Failure (Cardiology) [name], LCSW as Social Worker (Licensed Clinical Social Worker)  Patient Active Problem List   Diagnosis Date Noted  . OSA (obstructive sleep apnea) 11/18/2018  . Morbid obesity (HCC) 11/18/2018  . Acute on chronic combined systolic and diastolic congestive heart failure, NYHA class 4 (HCC) 07/21/2018  . PUD (peptic ulcer disease) 08/17/2016  . Acute esophagitis 08/17/2016  . Pulmonary HTN (HCC) 08/17/2016  . Lymphadenopathy 08/17/2016  . CAD (coronary artery disease) 08/16/2016  . Anemia   . DCM (dilated cardiomyopathy) (HCC)   . HTN (hypertension)   . Chronic combined systolic and diastolic heart failure (HCC) 08/11/2016  . Acute blood loss anemia 08/11/2016  . Acute upper GI bleed 08/10/2016  . DOE (dyspnea on exertion) 08/10/2016  . LBBB (left bundle branch block) 08/10/2016    Current Outpatient Medications:  .  acetaminophen (TYLENOL) 325 MG tablet, Take 650 mg by mouth every 6 (six) hours as needed., Disp: , Rfl:  .  albuterol (PROVENTIL) (2.5 MG/3ML) 0.083% nebulizer solution, Inhale 3 mLs into the lungs every 6 (six) hours as needed for wheezing or shortness of breath., Disp: , Rfl:  .  aspirin 81 MG chewable tablet, Chew 1 tablet (81 mg total) by mouth daily., Disp: 30 tablet, Rfl: 6 .  atorvastatin (LIPITOR) 40 MG tablet, TAKE 1 TABLET BY MOUTH ONCE DAILY AT  6  PM, Disp: 30 tablet, Rfl: 0 .  budesonide-formoterol (SYMBICORT) 80-4.5 MCG/ACT inhaler, Inhale 1 puff into the lungs 2 (two) times daily., Disp: 1 Inhaler, Rfl: 12 .  ferrous sulfate 325 (65 FE) MG tablet, Take 325 mg by mouth daily with breakfast., Disp: , Rfl:  .  furosemide (LASIX) 40 MG tablet, Take 2  tablets (80 mg total) by mouth 2 (two) times daily., Disp: 120 tablet, Rfl: 3 .  ipratropium (ATROVENT) 0.02 % nebulizer solution, Take 2.5 mLs (0.5 mg total) by nebulization 2 (two) times daily., Disp: 75 mL, Rfl: 12 .  omeprazole (PRILOSEC) 40 MG capsule, Take 40 mg by mouth as needed., Disp: , Rfl:  .  potassium chloride (KLOR-CON) 10 MEQ tablet, Take 1 tablet (10 mEq total) by mouth daily., Disp: 30 tablet, Rfl: 3 .  sacubitril-valsartan (ENTRESTO) 97-103 MG, Take 1 tablet by mouth 2 (two) times daily., Disp: 60 tablet, Rfl: 6 .  spironolactone (ALDACTONE) 25 MG tablet, Take 1 tablet (25 mg total) by mouth daily., Disp: 30 tablet, Rfl: 6 .  carvedilol (COREG) 6.25 MG tablet, Take 1.5 tablets (9.375 mg total) by mouth 2 (two) times daily with a meal for 30 days., Disp: 270 tablet, Rfl: 3 Allergies  Allergen Reactions  . Penicillins     Pt reports it makes him feel shaky Has patient had a PCN reaction causing immediate rash, facial/tongue/throat swelling, SOB or lightheadedness with hypotension: YES Has patient had a PCN reaction causing severe rash involving mucus membranes or skin necrosis: NO Has patient had a PCN reaction that required hospitalization NO Has patient had a PCN reaction occurring within the last 10 years: NO If all of the above answers are "NO", then may proceed with Cephalosporin use.      Social History   Socioeconomic History  .  Marital status: Single    Spouse name: Not on file  . Number of children: Not on file  . Years of education: Not on file  . Highest education level: Not on file  Occupational History  . Not on file  Tobacco Use  . Smoking status: Former Smoker    Packs/day: 0.50    Years: 44.00    Pack years: 22.00    Types: Cigarettes    Quit date: 06/11/1998    Years since quitting: 21.2  . Smokeless tobacco: Never Used  Substance and Sexual Activity  . Alcohol use: No  . Drug use: No  . Sexual activity: Not on file  Other Topics Concern  .  Not on file  Social History Narrative  . Not on file   Social Determinants of Health   Financial Resource Strain:   . Difficulty of Paying Living Expenses:   Food Insecurity:   . Worried About Charity fundraiser in the Last Year:   . Arboriculturist in the Last Year:   Transportation Needs:   . Film/video editor (Medical):   Marland Kitchen Lack of Transportation (Non-Medical):   Physical Activity:   . Days of Exercise per Week:   . Minutes of Exercise per Session:   Stress:   . Feeling of Stress :   Social Connections:   . Frequency of Communication with Friends and Family:   . Frequency of Social Gatherings with Friends and Family:   . Attends Religious Services:   . Active Member of Clubs or Organizations:   . Attends Archivist Meetings:   Marland Kitchen Marital Status:   Intimate Partner Violence:   . Fear of Current or Ex-Partner:   . Emotionally Abused:   Marland Kitchen Physically Abused:   . Sexually Abused:     Physical Exam      No future appointments.  BP 118/62   Pulse 80   Temp 99.5 F (37.5 C)   Resp 18   Wt 234 lb (106.1 kg)   SpO2 99%   BMI 36.65 kg/m   Weight yesterday-234 Last visit weight-234  Pt reports he is doing well, he had some episodes when he felt tired when he was up vacuuming and walking a lot, after rest it went away. No issues since then.  He ran out of his entresto, there was some paperwork that he missed that had been going to his old address- we called entresto so he has to fill out the application again and send it in.  His daughter has the paperwork and they will work on it this wknd and I will come back by on Monday to take to clinic to fax it over.  Ordered his lasix and omeprazole from [facility].  He will p/u today.  No edema noted. No sob. No issues or concerns presently.  meds verified. Pill box checked. No issues.    Marylouise Stacks, [facility] Paramedic  08/20/19

## 2019-09-04 ENCOUNTER — Telehealth (HOSPITAL_COMMUNITY): Payer: Self-pay | Admitting: Pharmacist

## 2019-09-04 NOTE — Telephone Encounter (Signed)
Advanced Heart Failure Patient Advocate Encounter   Patient was approved to receive Entresto from [employer].  Patient ID: [id number] Effective dates: 09/04/19 through 06/10/20  Karle Plumber, PharmD, BCPS, BCCP, CPP Heart Failure Clinic Pharmacist [phone]

## 2019-09-08 ENCOUNTER — Other Ambulatory Visit (HOSPITAL_COMMUNITY): Payer: Self-pay | Admitting: Internal Medicine

## 2019-09-14 ENCOUNTER — Telehealth (HOSPITAL_COMMUNITY): Payer: Self-pay

## 2019-09-14 ENCOUNTER — Other Ambulatory Visit: Payer: Self-pay

## 2019-09-14 ENCOUNTER — Other Ambulatory Visit (HOSPITAL_COMMUNITY): Payer: Self-pay | Admitting: *Deleted

## 2019-09-14 ENCOUNTER — Ambulatory Visit (HOSPITAL_COMMUNITY)
Admission: RE | Admit: 2019-09-14 | Discharge: 2019-09-14 | Disposition: A | Discharge status: Home / Self Care | Payer: Medicare Other | Source: Ambulatory Visit | Attending: Adult Health | Admitting: Adult Health

## 2019-09-14 MED ORDER — FUROSEMIDE 40 MG PO TABS: 80.0000 mg | ORAL_TABLET | Freq: Two times a day (BID) | ORAL | 3 refills | Status: DC

## 2019-09-14 NOTE — Telephone Encounter (Signed)
I called pt regarding his missed clinic visit appointment this morning.  He said he went by there but didn't know where to go and didn't have the code. He didn't think to call me about the code, he is out driving right now and will call to resch when he gets home.  He did mention that the pharmacy gave him lasix rx of 40mg  daily. He has been doing what those instructions said and his legs are starting to swell. I told him he should be taking 80mg  BID and I will get clinic to send in correct rx dosage.   , EMT-Paramedic  09/14/19

## 2019-09-22 ENCOUNTER — Other Ambulatory Visit (HOSPITAL_COMMUNITY): Payer: Self-pay

## 2019-09-22 NOTE — Progress Notes (Signed)
Paramedicine Encounter    Patient ID: Kristopher [name], male    DOB: 09/03/1950, 69 y.o.   MRN: [id number]   Patient Care Team: Shirlean Mylar, MD as PCP - General (Family Medicine) Quintella Reichert, MD as PCP - Cardiology (Cardiology) Bensimhon, Bevelyn Buckles, MD as PCP - Advanced Heart Failure (Cardiology) [name], LCSW as Social Worker (Licensed Clinical Social Worker)  Patient Active Problem List   Diagnosis Date Noted  . OSA (obstructive sleep apnea) 11/18/2018  . Morbid obesity (HCC) 11/18/2018  . Acute on chronic combined systolic and diastolic congestive heart failure, NYHA class 4 (HCC) 07/21/2018  . PUD (peptic ulcer disease) 08/17/2016  . Acute esophagitis 08/17/2016  . Pulmonary HTN (HCC) 08/17/2016  . Lymphadenopathy 08/17/2016  . CAD (coronary artery disease) 08/16/2016  . Anemia   . DCM (dilated cardiomyopathy) (HCC)   . HTN (hypertension)   . Chronic combined systolic and diastolic heart failure (HCC) 08/11/2016  . Acute blood loss anemia 08/11/2016  . Acute upper GI bleed 08/10/2016  . DOE (dyspnea on exertion) 08/10/2016  . LBBB (left bundle branch block) 08/10/2016    Current Outpatient Medications:  .  acetaminophen (TYLENOL) 325 MG tablet, Take 650 mg by mouth every 6 (six) hours as needed., Disp: , Rfl:  .  albuterol (PROVENTIL) (2.5 MG/3ML) 0.083% nebulizer solution, Inhale 3 mLs into the lungs every 6 (six) hours as needed for wheezing or shortness of breath., Disp: , Rfl:  .  aspirin 81 MG chewable tablet, Chew 1 tablet (81 mg total) by mouth daily., Disp: 30 tablet, Rfl: 6 .  atorvastatin (LIPITOR) 40 MG tablet, TAKE 1 TABLET BY MOUTH ONCE DAILY AT  6  PM, Disp: 30 tablet, Rfl: 0 .  budesonide-formoterol (SYMBICORT) 80-4.5 MCG/ACT inhaler, Inhale 1 puff into the lungs 2 (two) times daily., Disp: 1 Inhaler, Rfl: 12 .  ferrous sulfate 325 (65 FE) MG tablet, Take 325 mg by mouth daily with breakfast., Disp: , Rfl:  .  furosemide (LASIX) 40 MG tablet, Take 2  tablets (80 mg total) by mouth 2 (two) times daily., Disp: 120 tablet, Rfl: 3 .  ipratropium (ATROVENT) 0.02 % nebulizer solution, Take 2.5 mLs (0.5 mg total) by nebulization 2 (two) times daily., Disp: 75 mL, Rfl: 12 .  omeprazole (PRILOSEC) 40 MG capsule, Take 40 mg by mouth as needed., Disp: , Rfl:  .  potassium chloride (KLOR-CON) 10 MEQ tablet, Take 1 tablet (10 mEq total) by mouth daily., Disp: 30 tablet, Rfl: 3 .  sacubitril-valsartan (ENTRESTO) 97-103 MG, Take 1 tablet by mouth 2 (two) times daily., Disp: 60 tablet, Rfl: 6 .  spironolactone (ALDACTONE) 25 MG tablet, Take 1 tablet (25 mg total) by mouth daily., Disp: 30 tablet, Rfl: 6 .  carvedilol (COREG) 6.25 MG tablet, Take 1.5 tablets (9.375 mg total) by mouth 2 (two) times daily with a meal for 30 days., Disp: 270 tablet, Rfl: 3 Allergies  Allergen Reactions  . Penicillins     Pt reports it makes him feel shaky Has patient had a PCN reaction causing immediate rash, facial/tongue/throat swelling, SOB or lightheadedness with hypotension: YES Has patient had a PCN reaction causing severe rash involving mucus membranes or skin necrosis: NO Has patient had a PCN reaction that required hospitalization NO Has patient had a PCN reaction occurring within the last 10 years: NO If all of the above answers are "NO", then may proceed with Cephalosporin use.      Social History   Socioeconomic History  .  Marital status: Single    Spouse name: Not on file  . Number of children: Not on file  . Years of education: Not on file  . Highest education level: Not on file  Occupational History  . Not on file  Tobacco Use  . Smoking status: Former Smoker    Packs/day: 0.50    Years: 44.00    Pack years: 22.00    Types: Cigarettes    Quit date: 06/11/1998    Years since quitting: 21.2  . Smokeless tobacco: Never Used  Substance and Sexual Activity  . Alcohol use: No  . Drug use: No  . Sexual activity: Not on file  Other Topics Concern  .  Not on file  Social History Narrative  . Not on file   Social Determinants of Health   Financial Resource Strain:   . Difficulty of Paying Living Expenses:   Food Insecurity:   . Worried About Programme researcher, broadcasting/film/video in the Last Year:   . Barista in the Last Year:   Transportation Needs:   . Freight forwarder (Medical):   Marland Kitchen Lack of Transportation (Non-Medical):   Physical Activity:   . Days of Exercise per Week:   . Minutes of Exercise per Session:   Stress:   . Feeling of Stress :   Social Connections:   . Frequency of Communication with Friends and Family:   . Frequency of Social Gatherings with Friends and Family:   . Attends Religious Services:   . Active Member of Clubs or Organizations:   . Attends Banker Meetings:   Marland Kitchen Marital Status:   Intimate Partner Violence:   . Fear of Current or Ex-Partner:   . Emotionally Abused:   Marland Kitchen Physically Abused:   . Sexually Abused:     Physical Exam      Future Appointments  Date Time Provider Department Center  10/05/2019  9:00 AM [facility] PA/NP [facility] None    BP 100/60   Pulse 80   Temp 99.3 F (37.4 C)   Resp 18   Wt 238 lb (108 kg)   SpO2 96%   BMI 37.28 kg/m  B/p standing-122/pal Weighed with clothes on this morning  Weight yesterday-?? Weight Friday-235 Last visit weight-234  Pt reports he is feeling better-he had weight gain b/c pharmacy gave him wrong lasix dose but he started taking the correct dosage until he ran out of those and p/u correct rx from pharmacy. His weight is back down.  He states he felt funny, his legs began to swell all symptoms increasing fluid. But he is back to normal now.  He didn't weigh yesterday-will weigh this morning. Pill box checked- he didn't have the lasix in there but he reports that he ran out of the other bottle of pills but when he takes his pills he counts them to make sure its correct. So the lasix was added where it was needed.  He denies increased  sob, no dizziness, no c/p. Edema has subsided.  He does have mucous and phlegm in the morning time, I gave him list of meds he could take for that. He has been taking alka seltzer but I told him he should not take those. He is going out after our visit to p/u appropriate OTC meds.   Kerry Hough, EMT-Paramedic [phone] [facility] Paramedic  09/22/19

## 2019-10-05 ENCOUNTER — Ambulatory Visit (HOSPITAL_COMMUNITY)
Admission: RE | Admit: 2019-10-05 | Discharge: 2019-10-05 | Disposition: A | Discharge status: Home / Self Care | Payer: Medicare Other | Source: Ambulatory Visit | Attending: Adult Health | Admitting: Adult Health

## 2019-10-05 ENCOUNTER — Encounter (HOSPITAL_COMMUNITY): Payer: Self-pay

## 2019-10-05 ENCOUNTER — Other Ambulatory Visit: Payer: Self-pay

## 2019-10-05 VITALS — BP 96/62 | HR 84 | Wt 239.4 lb

## 2019-10-05 DIAGNOSIS — J449 Chronic obstructive pulmonary disease, unspecified: Secondary | ICD-10-CM | POA: Insufficient documentation

## 2019-10-05 DIAGNOSIS — I110 Hypertensive heart disease with heart failure: Secondary | ICD-10-CM | POA: Insufficient documentation

## 2019-10-05 DIAGNOSIS — I447 Left bundle-branch block, unspecified: Secondary | ICD-10-CM | POA: Diagnosis not present

## 2019-10-05 DIAGNOSIS — Z8249 Family history of ischemic heart disease and other diseases of the circulatory system: Secondary | ICD-10-CM | POA: Insufficient documentation

## 2019-10-05 DIAGNOSIS — Z7951 Long term (current) use of inhaled steroids: Secondary | ICD-10-CM | POA: Insufficient documentation

## 2019-10-05 DIAGNOSIS — G4733 Obstructive sleep apnea (adult) (pediatric): Secondary | ICD-10-CM | POA: Diagnosis not present

## 2019-10-05 DIAGNOSIS — Z87891 Personal history of nicotine dependence: Secondary | ICD-10-CM | POA: Diagnosis not present

## 2019-10-05 DIAGNOSIS — I5042 Chronic combined systolic (congestive) and diastolic (congestive) heart failure: Secondary | ICD-10-CM | POA: Diagnosis not present

## 2019-10-05 DIAGNOSIS — I5022 Chronic systolic (congestive) heart failure: Secondary | ICD-10-CM | POA: Diagnosis present

## 2019-10-05 DIAGNOSIS — Z7982 Long term (current) use of aspirin: Secondary | ICD-10-CM | POA: Diagnosis not present

## 2019-10-05 DIAGNOSIS — E119 Type 2 diabetes mellitus without complications: Secondary | ICD-10-CM | POA: Insufficient documentation

## 2019-10-05 DIAGNOSIS — Z6837 Body mass index (BMI) 37.0-37.9, adult: Secondary | ICD-10-CM | POA: Diagnosis not present

## 2019-10-05 DIAGNOSIS — E669 Obesity, unspecified: Secondary | ICD-10-CM | POA: Diagnosis not present

## 2019-10-05 DIAGNOSIS — Z880 Allergy status to penicillin: Secondary | ICD-10-CM | POA: Diagnosis not present

## 2019-10-05 DIAGNOSIS — I272 Pulmonary hypertension, unspecified: Secondary | ICD-10-CM | POA: Diagnosis not present

## 2019-10-05 DIAGNOSIS — Z79899 Other long term (current) drug therapy: Secondary | ICD-10-CM | POA: Insufficient documentation

## 2019-10-05 DIAGNOSIS — I428 Other cardiomyopathies: Secondary | ICD-10-CM | POA: Diagnosis not present

## 2019-10-05 DIAGNOSIS — I2510 Atherosclerotic heart disease of native coronary artery without angina pectoris: Secondary | ICD-10-CM | POA: Insufficient documentation

## 2019-10-05 DIAGNOSIS — Z8711 Personal history of peptic ulcer disease: Secondary | ICD-10-CM | POA: Insufficient documentation

## 2019-10-05 LAB — BASIC METABOLIC PANEL
Anion gap: 11 (ref 5–15)
BUN: 15 mg/dL (ref 8–23)
CO2: 27 mmol/L (ref 22–32)
Calcium: 9.2 mg/dL (ref 8.9–10.3)
Chloride: 100 mmol/L (ref 98–111)
Creatinine, Ser: 1.4 mg/dL — ABNORMAL HIGH (ref 0.61–1.24)
GFR calc Af Amer: 59 mL/min — ABNORMAL LOW (ref >60)
GFR calc non Af Amer: 51 mL/min — ABNORMAL LOW (ref >60)
Glucose, Bld: 102 mg/dL — ABNORMAL HIGH (ref 70–99)
Potassium: 4.2 mmol/L (ref 3.5–5.1)
Sodium: 138 mmol/L (ref 135–145)

## 2019-10-05 NOTE — Patient Instructions (Signed)
It was great to see you today! No medication changes are needed at this time.   Labs today We will only contact you if something comes back abnormal or we need to make some changes. Otherwise no news is good news!  Your physician recommends that you schedule a follow-up appointment in: 6 months with Dr Gala Romney  Do the following things EVERYDAY: 1) Weigh yourself in the morning before breakfast. Write it down and keep it in a log. 2) Take your medicines as prescribed 3) Eat low salt foods--Limit salt (sodium) to 2000 mg per day.  4) Stay as active as you can everyday 5) Limit all fluids for the day to less than 2 liters  At the [facility], you and your health needs are our priority. As part of our continuing mission to provide you with exceptional heart care, we have created designated Provider Care Teams. These Care Teams include your primary Cardiologist (physician) and Advanced Practice Providers (APPs- Physician Assistants and Nurse Practitioners) who all work together to provide you with the care you need, when you need it.   You may see any of the following providers on your designated Care Team at your next follow up: Marland Kitchen Dr Arvilla Meres . Dr Marca Ancona . Tonye Becket, NP . Robbie Lis, PA . Karle Plumber, PharmD   Please be sure to bring in all your medications bottles to every appointment.

## 2019-10-05 NOTE — Progress Notes (Addendum)
Advanced HF Clinic Note ID:  [name], Bady [birth year], MRN [id number]  Location: Home  Provider location: [address] [place] Type of Visit: Established patient   PCP:  Maurice Small, MD  Cardiologist:  Fransico Him, MD Primary HF: Dr Haroldine Laws  Chief Complaint: F/U for Chronic Systolic Heart Failure    History of Present Illness: Kristopher [name] is a 69 y.o. male with a history of obesity, HTN, DM2, Asthma, Chronic combined diastolic/systolic CHF, severe OSA (AHI 65) and NICM.  Admitted 2/10 - 08/08/2018 with mixed AECOPD and A/C combined CHF. Initially required intubation in the setting of resp failure. Had [facility] as below in the setting of new drop in EF to 25-30%. Medications titrated as tolerated. He was discharged to SNF with CPAP (also new for him). D/c weight 235 pounds  Sleep study 10/31/18 with severe OSA (AHI 65). Now on CPAP w/ good compliance. Also w/ improved medication compliance w/ the help of paramedicine.   Most recent echo was 11/2018 and showed persistently low EF, ~ 25-30%. He was referred to EP for ICD but declined. Does not want device.   He presents to clinic today for f/u. Doing well from CHF standpoint. Volume status stable. Euvolemic on exam. Wt stable. Reports full med compliance and fully compliant w/ CPAP. Denies dyspnea, CP, orthopnea/PND, LEE, palpitations, syncope/ near syncope. BP soft but stable. No orthostatic symptoms.   Cardiac Studies  Cath 2/20   Prox LAD lesion is 30% stenosed.  Prox RCA to Mid RCA lesion is 30% stenosed.  Prox Cx lesion is 30% stenosed.   Findings:  Ao = 107/69 (87)  LV =  119/13 RA =  5 RV = 64/11 PA = 71/23 (39) PCW = 21 Fick cardiac output/index = 5.6/2.6 PVR = 3.1 WU Ao sat = 94% PA sat = 64%, 65%  Assessment: 1. Minimal non-obstructive CAD 2. NICM EF 30-35% 3. Moderate pulmonary HTN due primarily to OSA/OHS     2D Echo 11/2018 1. The left ventricle has severely reduced systolic  function, with an  ejection fraction of 25-30%. The cavity size was mildly dilated. There is  moderate concentric left ventricular hypertrophy. Left ventricular  diastolic Doppler parameters are  indeterminate. There is abnormal septal motion consistent with left bundle  branch block. Left ventrical global hypokinesis without regional wall  motion abnormalities.  2. LV function with echo contrast (definity) appears to be in 25-30%  range. No LV thrombus.  3. The right ventricle has mildly reduced systolic function. The cavity  was mildly enlarged. There is no increase in right ventricular wall  thickness. Right ventricular systolic pressure could not be assessed.  4. Left atrial size was mildly dilated.  5. Right atrial size was mildly dilated.  6. Trivial pericardial effusion is present.  7. The aortic valve is tricuspid. Mild calcification of the aortic valve.  Aortic valve regurgitation is trivial by color flow Doppler. No stenosis  of the aortic valve.    Pt denies symptoms of cough, fevers, chills, or new SOB worrisome for COVID 19.    Past Medical History:  Diagnosis Date  . Acute combined systolic and diastolic heart failure ([place]) 08/11/2016  . Acute esophagitis 08/17/2016  . Acute upper GI bleed 08/10/2016  . Asthma   . CAD (coronary artery disease) 08/16/2016  . CHF (congestive heart failure) ([place])   . DCM (dilated cardiomyopathy) ([place])   . Dyspnea   . HTN (hypertension)   .  LBBB (left bundle branch block) 08/10/2016  . Lymphadenopathy 08/17/2016  . OSA (obstructive sleep apnea) 11/18/2018   [place] home sleep study revealed Severe Obstructive Sleep Apnea with AHI 65.8/hr. Central sleep apnea was noted with an pAHIc of 11.4/hr. % Cheyne Stokes respirations was 10.3%. Nocturnal Hypoxemia was noted with O2 saturations as low as 72%. Time spent with Oxygen desaturations < 88% was 97.6 minutes.  . PUD (peptic ulcer disease) 08/17/2016  . Pulmonary HTN (HCC) 08/17/2016   Past Surgical  History:  Procedure Laterality Date  . ESOPHAGOGASTRODUODENOSCOPY (EGD) WITH PROPOFOL Left 08/11/2016   Procedure: ESOPHAGOGASTRODUODENOSCOPY (EGD) WITH PROPOFOL;  Surgeon: Willis Modena, MD;  Location[facility] ENDOSCOPY;  Service: Endoscopy;  Laterality: Left;  . RIGHT/LEFT HEART CATH AND CORONARY ANGIOGRAPHY N/A 08/15/2016   Procedure: Right/Left Heart Cath and Coronary Angiography;  Surgeon: Corky Crafts, MD;  Location: [facility] INVASIVE CV LAB;  Service: Cardiovascular;  Laterality: N/A;  . RIGHT/LEFT HEART CATH AND CORONARY ANGIOGRAPHY N/A 08/04/2018   Procedure: RIGHT/LEFT HEART CATH AND CORONARY ANGIOGRAPHY;  Surgeon: Dolores Patty, MD;  Location: [facility] INVASIVE CV LAB;  Service: Cardiovascular;  Laterality: N/A;     Current Outpatient Medications  Medication Sig Dispense Refill  . acetaminophen (TYLENOL) 325 MG tablet Take 650 mg by mouth every 6 (six) hours as needed.    Marland Kitchen albuterol (PROVENTIL) (2.5 MG/3ML) 0.083% nebulizer solution Inhale 3 mLs into the lungs every 6 (six) hours as needed for wheezing or shortness of breath.    Marland Kitchen aspirin 81 MG chewable tablet Chew 1 tablet (81 mg total) by mouth daily. 30 tablet 6  . atorvastatin (LIPITOR) 40 MG tablet TAKE 1 TABLET BY MOUTH ONCE DAILY AT  6  PM 30 tablet 0  . budesonide-formoterol (SYMBICORT) 80-4.5 MCG/ACT inhaler Inhale 1 puff into the lungs 2 (two) times daily. 1 Inhaler 12  . ferrous sulfate 325 (65 FE) MG tablet Take 325 mg by mouth daily with breakfast.    . furosemide (LASIX) 40 MG tablet Take 2 tablets (80 mg total) by mouth 2 (two) times daily. 120 tablet 3  . ipratropium (ATROVENT) 0.02 % nebulizer solution Take 2.5 mLs (0.5 mg total) by nebulization 2 (two) times daily. 75 mL 12  . omeprazole (PRILOSEC) 40 MG capsule Take 40 mg by mouth as needed.    . potassium chloride (KLOR-CON) 10 MEQ tablet Take 1 tablet (10 mEq total) by mouth daily. 30 tablet 3  . sacubitril-valsartan (ENTRESTO) 97-103 MG Take 1 tablet by mouth 2 (two)  times daily. 60 tablet 6  . spironolactone (ALDACTONE) 25 MG tablet Take 1 tablet (25 mg total) by mouth daily. 30 tablet 6  . carvedilol (COREG) 6.25 MG tablet Take 1.5 tablets (9.375 mg total) by mouth 2 (two) times daily with a meal for 30 days. 270 tablet 3   No current facility-administered medications for this encounter.    Allergies:   Penicillins   Social History:  The patient  reports that he quit smoking about 21 years ago. His smoking use included cigarettes. He has a 22.00 pack-year smoking history. He has never used smokeless tobacco. He reports that he does not drink alcohol or use drugs.   Family History:  The patient's family history includes Hypertension in his mother and sister.   ROS:  Please see the history of present illness.   All other systems are personally reviewed and negative.    Vitals:   10/05/19 0856  BP: 96/62  Pulse: 84  SpO2: 95%  PHYSICAL EXAM: General:  Well appearing, moderately obese. No respiratory difficulty HEENT: normal Neck: supple. no JVD. Carotids 2+ bilat; no bruits. No lymphadenopathy or thyromegaly appreciated. Cor: PMI nondisplaced. Regular rate & rhythm. No rubs, gallops or murmurs. Lungs: clear Abdomen: soft, nontender, nondistended. No hepatosplenomegaly. No bruits or masses. Good bowel sounds. Extremities: no cyanosis, clubbing, rash, edema Neuro: alert & oriented x 3, cranial nerves grossly intact. moves all 4 extremities w/o difficulty. Affect pleasant.   Recent Labs: 11/13/2018: B Natriuretic Peptide 58.8 01/07/2019: BUN 27; Creatinine, Ser 1.49; Potassium 4.5; Sodium 139  Personally reviewed   Wt Readings from Last 3 Encounters:  10/05/19 108.6 kg (239 lb 6.4 oz)  09/22/19 108 kg (238 lb)  08/20/19 106.1 kg (234 lb)      ASSESSMENT AND PLAN:  1. Chronic systolic/diastolic HF due to NICM (?LBBB) - Echo 01/07/17 EF 45%. Cath with minimal CAD - Echo 07/22/18: EF 25-30%, global HK, RV normal function, increased wall  thickness, LA moderately dilated, RA severely dilated - [facility] 2/24 with minimal non-obstructive CAD,NICM EF 30-35%, moderate pulmonary HTN due primarily to OSA/OHS, and relative well-compensated volume status, CI 2.6 - Echo 11/2018 EF 25-30%. He has declined ICD. We discussed this again today and he is adamant that he does not want a device - NYHAII symptoms, stable  - Volume status stable. Continuelasix40 mg BID. - Continue Entresto49/51mg  BID.BP too soft for further titration  - Continue spiro 25 mg daily. - Continue coreg 9.375 mg twice a day. - Followed by HF paramedicine. Appreciate their assistance - Check BMP today   2. Severe OSA - Severe OSA by PSG 10/31/18 AHI 65 - Now on CPAP and fully compliant. Followed by Dr. Mayford Knife   3. Mild nonobstructive CAD - On LHC 08/04/18 - no anginal symptoms - Continue ASA 81 mg daily  -Continuelipitor 40 mg daily  4. LBBB - Previously referred to EP for potential CRT-D but he declines  5. Obesity Body mass index is 37.5 kg/m.  - recommended that he increase physical activity. Encouraged walking for exercise/ wt loss.   F/u w/ Dr. Gala Romney in 6 months.    Signed, Robbie Lis, PA-C  10/05/2019 9:13 AM   [facility] [address] [facility] [place] [address] [phone] (office) [phone] (fax)

## 2019-10-13 ENCOUNTER — Other Ambulatory Visit (HOSPITAL_COMMUNITY): Payer: Self-pay | Admitting: Internal Medicine

## 2019-10-14 ENCOUNTER — Other Ambulatory Visit (HOSPITAL_COMMUNITY): Payer: Self-pay

## 2019-10-14 NOTE — Progress Notes (Signed)
Paramedicine Encounter    Patient ID: RAS KOLLMAN, male    DOB: December 03, 1950, 69 y.o.   MRN: [id number]   Patient Care Team: Shirlean Mylar, MD as PCP - General (Family Medicine) Quintella Reichert, MD as PCP - Cardiology (Cardiology) Bensimhon, Bevelyn Buckles, MD as PCP - Advanced Heart Failure (Cardiology) [name], LCSW as Social Worker (Licensed Clinical Social Worker)  Patient Active Problem List   Diagnosis Date Noted  . OSA (obstructive sleep apnea) 11/18/2018  . Morbid obesity (HCC) 11/18/2018  . Acute on chronic combined systolic and diastolic congestive heart failure, NYHA class 4 (HCC) 07/21/2018  . PUD (peptic ulcer disease) 08/17/2016  . Acute esophagitis 08/17/2016  . Pulmonary HTN (HCC) 08/17/2016  . Lymphadenopathy 08/17/2016  . CAD (coronary artery disease) 08/16/2016  . Anemia   . DCM (dilated cardiomyopathy) (HCC)   . HTN (hypertension)   . Chronic combined systolic and diastolic heart failure (HCC) 08/11/2016  . Acute blood loss anemia 08/11/2016  . Acute upper GI bleed 08/10/2016  . DOE (dyspnea on exertion) 08/10/2016  . LBBB (left bundle branch block) 08/10/2016    Current Outpatient Medications:  .  acetaminophen (TYLENOL) 325 MG tablet, Take 650 mg by mouth every 6 (six) hours as needed., Disp: , Rfl:  .  albuterol (PROVENTIL) (2.5 MG/3ML) 0.083% nebulizer solution, Inhale 3 mLs into the lungs every 6 (six) hours as needed for wheezing or shortness of breath., Disp: , Rfl:  .  aspirin 81 MG chewable tablet, Chew 1 tablet (81 mg total) by mouth daily., Disp: 30 tablet, Rfl: 6 .  atorvastatin (LIPITOR) 40 MG tablet, TAKE 1 TABLET BY MOUTH ONCE DAILY AT 6PM, Disp: 90 tablet, Rfl: 3 .  budesonide-formoterol (SYMBICORT) 80-4.5 MCG/ACT inhaler, Inhale 1 puff into the lungs 2 (two) times daily., Disp: 1 Inhaler, Rfl: 12 .  ferrous sulfate 325 (65 FE) MG tablet, Take 325 mg by mouth daily with breakfast., Disp: , Rfl:  .  furosemide (LASIX) 40 MG tablet, Take 2 tablets  (80 mg total) by mouth 2 (two) times daily., Disp: 120 tablet, Rfl: 3 .  ipratropium (ATROVENT) 0.02 % nebulizer solution, Take 2.5 mLs (0.5 mg total) by nebulization 2 (two) times daily., Disp: 75 mL, Rfl: 12 .  omeprazole (PRILOSEC) 40 MG capsule, Take 40 mg by mouth as needed., Disp: , Rfl:  .  potassium chloride (KLOR-CON) 10 MEQ tablet, Take 1 tablet (10 mEq total) by mouth daily., Disp: 30 tablet, Rfl: 3 .  sacubitril-valsartan (ENTRESTO) 97-103 MG, Take 1 tablet by mouth 2 (two) times daily., Disp: 60 tablet, Rfl: 6 .  spironolactone (ALDACTONE) 25 MG tablet, Take 1 tablet (25 mg total) by mouth daily., Disp: 30 tablet, Rfl: 6 .  carvedilol (COREG) 6.25 MG tablet, Take 1.5 tablets (9.375 mg total) by mouth 2 (two) times daily with a meal for 30 days., Disp: 270 tablet, Rfl: 3 Allergies  Allergen Reactions  . Penicillins     Pt reports it makes him feel shaky Has patient had a PCN reaction causing immediate rash, facial/tongue/throat swelling, SOB or lightheadedness with hypotension: YES Has patient had a PCN reaction causing severe rash involving mucus membranes or skin necrosis: NO Has patient had a PCN reaction that required hospitalization NO Has patient had a PCN reaction occurring within the last 10 years: NO If all of the above answers are "NO", then may proceed with Cephalosporin use.      Social History   Socioeconomic History  . Marital  status: Single    Spouse name: Not on file  . Number of children: Not on file  . Years of education: Not on file  . Highest education level: Not on file  Occupational History  . Not on file  Tobacco Use  . Smoking status: Former Smoker    Packs/day: 0.50    Years: 44.00    Pack years: 22.00    Types: Cigarettes    Quit date: 06/11/1998    Years since quitting: 21.3  . Smokeless tobacco: Never Used  Substance and Sexual Activity  . Alcohol use: No  . Drug use: No  . Sexual activity: Not on file  Other Topics Concern  . Not on  file  Social History Narrative  . Not on file   Social Determinants of Health   Financial Resource Strain:   . Difficulty of Paying Living Expenses:   Food Insecurity:   . Worried About Charity fundraiser in the Last Year:   . Arboriculturist in the Last Year:   Transportation Needs:   . Film/video editor (Medical):   Marland Kitchen Lack of Transportation (Non-Medical):   Physical Activity:   . Days of Exercise per Week:   . Minutes of Exercise per Session:   Stress:   . Feeling of Stress :   Social Connections:   . Frequency of Communication with Friends and Family:   . Frequency of Social Gatherings with Friends and Family:   . Attends Religious Services:   . Active Member of Clubs or Organizations:   . Attends Archivist Meetings:   Marland Kitchen Marital Status:   Intimate Partner Violence:   . Fear of Current or Ex-Partner:   . Emotionally Abused:   Marland Kitchen Physically Abused:   . Sexually Abused:     Physical Exam      No future appointments.  BP (!) 98/58   Pulse 90   Temp 99.3 F (37.4 C)   Resp 18   Wt 232 lb (105.2 kg)   SpO2 97%   BMI 36.34 kg/m   Weight yesterday-232 Last visit weight-239 @ CLINIC  Pt reports he is feeling good, he states he can walk longer, his weight is down. Pt denies increased sob. No c/p, no dizziness.  He had clinic appoint last week, all was well. He does not want a device implanted. He is doing well. He fills his own pill box.  meds verified and pill box checked.  I will get him a symbicort med assist application to send in if he is eligible for assistance for co-pay.  I will get that printed out and get back to him to sign and turn in.     Marylouise Stacks, [place] [facility] Paramedic  10/14/19

## 2019-10-26 ENCOUNTER — Telehealth (HOSPITAL_COMMUNITY): Payer: Self-pay

## 2019-10-26 NOTE — Telephone Encounter (Signed)
Pt had called me b/c his family wanted him to call me to report that he had an episode early this morning that lasted approx 2-3 hrs of not feeling well. He states he had diarrhea several times and the last time he had to use that bathroom 3x in a period and he had became diaphoretic, weak. He had felt poor appetite the day before too. He states he feels much better now. Appetite still not quite there but he is trying. I told him to try to eat and hydrate himself and to check his b/p with his home machine.  I advised him to call 911 and go to ER if this happens again. He denied any c/p during this time or sob.  He assured me he felt much better and will get help if happens again.  I will f/u this week.   Kerry Hough, EMT-Paramedic  10/26/19

## 2019-11-05 ENCOUNTER — Telehealth (HOSPITAL_COMMUNITY): Payer: Self-pay

## 2019-11-05 NOTE — Telephone Encounter (Signed)
Pt had called me stating he went to his doc yesterday and labs were done and his blood count was 6 and they wanted him to go to ER. However he did not have anybody available to take him, I offered to get him EMS unit to take him but he was concerned with the bill. He said he had family member that could take him in the morning. He denied having any bleeding in his urine or stool. He denies feeling bad at all.  Will f/u next week.    Kerry Hough, EMT-Paramedic  11/05/19

## 2019-11-06 ENCOUNTER — Encounter (HOSPITAL_COMMUNITY): Payer: Self-pay | Admitting: Emergency Medicine

## 2019-11-06 ENCOUNTER — Inpatient Hospital Stay (HOSPITAL_COMMUNITY)
Admission: EM | Admit: 2019-11-06 | Discharge: 2019-11-08 | DRG: 378 | Disposition: A | Discharge status: Home / Self Care | Payer: Medicare Other | Attending: Family Medicine | Admitting: Family Medicine

## 2019-11-06 ENCOUNTER — Other Ambulatory Visit: Payer: Self-pay | Admitting: Physician Assistant

## 2019-11-06 DIAGNOSIS — K621 Rectal polyp: Secondary | ICD-10-CM | POA: Diagnosis present

## 2019-11-06 DIAGNOSIS — Z8711 Personal history of peptic ulcer disease: Secondary | ICD-10-CM

## 2019-11-06 DIAGNOSIS — Z9114 Patient's other noncompliance with medication regimen: Secondary | ICD-10-CM

## 2019-11-06 DIAGNOSIS — I2510 Atherosclerotic heart disease of native coronary artery without angina pectoris: Secondary | ICD-10-CM | POA: Diagnosis present

## 2019-11-06 DIAGNOSIS — I447 Left bundle-branch block, unspecified: Secondary | ICD-10-CM | POA: Diagnosis present

## 2019-11-06 DIAGNOSIS — Z7982 Long term (current) use of aspirin: Secondary | ICD-10-CM | POA: Diagnosis not present

## 2019-11-06 DIAGNOSIS — D649 Anemia, unspecified: Secondary | ICD-10-CM | POA: Diagnosis present

## 2019-11-06 DIAGNOSIS — D500 Iron deficiency anemia secondary to blood loss (chronic): Secondary | ICD-10-CM | POA: Diagnosis present

## 2019-11-06 DIAGNOSIS — I272 Pulmonary hypertension, unspecified: Secondary | ICD-10-CM | POA: Diagnosis present

## 2019-11-06 DIAGNOSIS — E785 Hyperlipidemia, unspecified: Secondary | ICD-10-CM | POA: Diagnosis present

## 2019-11-06 DIAGNOSIS — K449 Diaphragmatic hernia without obstruction or gangrene: Secondary | ICD-10-CM | POA: Diagnosis present

## 2019-11-06 DIAGNOSIS — K922 Gastrointestinal hemorrhage, unspecified: Secondary | ICD-10-CM | POA: Diagnosis present

## 2019-11-06 DIAGNOSIS — Z20822 Contact with and (suspected) exposure to covid-19: Secondary | ICD-10-CM | POA: Diagnosis present

## 2019-11-06 DIAGNOSIS — K648 Other hemorrhoids: Secondary | ICD-10-CM | POA: Diagnosis present

## 2019-11-06 DIAGNOSIS — Z79899 Other long term (current) drug therapy: Secondary | ICD-10-CM

## 2019-11-06 DIAGNOSIS — K5730 Diverticulosis of large intestine without perforation or abscess without bleeding: Secondary | ICD-10-CM | POA: Diagnosis present

## 2019-11-06 DIAGNOSIS — Z880 Allergy status to penicillin: Secondary | ICD-10-CM

## 2019-11-06 DIAGNOSIS — I5042 Chronic combined systolic (congestive) and diastolic (congestive) heart failure: Secondary | ICD-10-CM | POA: Diagnosis present

## 2019-11-06 DIAGNOSIS — D75839 Thrombocytosis, unspecified: Secondary | ICD-10-CM | POA: Diagnosis present

## 2019-11-06 DIAGNOSIS — Z8249 Family history of ischemic heart disease and other diseases of the circulatory system: Secondary | ICD-10-CM

## 2019-11-06 DIAGNOSIS — K254 Chronic or unspecified gastric ulcer with hemorrhage: Principal | ICD-10-CM | POA: Diagnosis present

## 2019-11-06 DIAGNOSIS — I110 Hypertensive heart disease with heart failure: Secondary | ICD-10-CM | POA: Diagnosis present

## 2019-11-06 DIAGNOSIS — Z6836 Body mass index (BMI) 36.0-36.9, adult: Secondary | ICD-10-CM | POA: Diagnosis not present

## 2019-11-06 DIAGNOSIS — K429 Umbilical hernia without obstruction or gangrene: Secondary | ICD-10-CM | POA: Diagnosis present

## 2019-11-06 DIAGNOSIS — I10 Essential (primary) hypertension: Secondary | ICD-10-CM | POA: Diagnosis present

## 2019-11-06 DIAGNOSIS — J449 Chronic obstructive pulmonary disease, unspecified: Secondary | ICD-10-CM | POA: Diagnosis present

## 2019-11-06 DIAGNOSIS — Z87891 Personal history of nicotine dependence: Secondary | ICD-10-CM

## 2019-11-06 DIAGNOSIS — E669 Obesity, unspecified: Secondary | ICD-10-CM | POA: Diagnosis present

## 2019-11-06 DIAGNOSIS — I420 Dilated cardiomyopathy: Secondary | ICD-10-CM | POA: Diagnosis present

## 2019-11-06 DIAGNOSIS — N179 Acute kidney failure, unspecified: Secondary | ICD-10-CM | POA: Diagnosis present

## 2019-11-06 DIAGNOSIS — Z713 Dietary counseling and surveillance: Secondary | ICD-10-CM

## 2019-11-06 DIAGNOSIS — G4733 Obstructive sleep apnea (adult) (pediatric): Secondary | ICD-10-CM | POA: Diagnosis present

## 2019-11-06 DIAGNOSIS — Z7951 Long term (current) use of inhaled steroids: Secondary | ICD-10-CM

## 2019-11-06 DIAGNOSIS — K635 Polyp of colon: Secondary | ICD-10-CM | POA: Diagnosis present

## 2019-11-06 HISTORY — DX: Hyperlipidemia, unspecified: E78.5

## 2019-11-06 HISTORY — DX: Chronic obstructive pulmonary disease, unspecified: J44.9

## 2019-11-06 LAB — COMPREHENSIVE METABOLIC PANEL
ALT: 9 U/L (ref 0–44)
AST: 14 U/L — ABNORMAL LOW (ref 15–41)
Albumin: 4.1 g/dL (ref 3.5–5.0)
Alkaline Phosphatase: 83 U/L (ref 38–126)
Anion gap: 12 (ref 5–15)
BUN: 22 mg/dL (ref 8–23)
CO2: 28 mmol/L (ref 22–32)
Calcium: 9.6 mg/dL (ref 8.9–10.3)
Chloride: 97 mmol/L — ABNORMAL LOW (ref 98–111)
Creatinine, Ser: 1.47 mg/dL — ABNORMAL HIGH (ref 0.61–1.24)
GFR calc Af Amer: 56 mL/min — ABNORMAL LOW (ref >60)
GFR calc non Af Amer: 48 mL/min — ABNORMAL LOW (ref >60)
Glucose, Bld: 119 mg/dL — ABNORMAL HIGH (ref 70–99)
Potassium: 4.7 mmol/L (ref 3.5–5.1)
Sodium: 137 mmol/L (ref 135–145)
Total Bilirubin: 0.3 mg/dL (ref 0.3–1.2)
Total Protein: 7.6 g/dL (ref 6.5–8.1)

## 2019-11-06 LAB — CBC
HCT: 30.2 % — ABNORMAL LOW (ref 39.0–52.0)
Hemoglobin: 8.1 g/dL — ABNORMAL LOW (ref 13.0–17.0)
MCH: 19.9 pg — ABNORMAL LOW (ref 26.0–34.0)
MCHC: 26.8 g/dL — ABNORMAL LOW (ref 30.0–36.0)
MCV: 74 fL — ABNORMAL LOW (ref 80.0–100.0)
Platelets: 670 10*3/uL — ABNORMAL HIGH (ref 150–400)
RBC: 4.08 MIL/uL — ABNORMAL LOW (ref 4.22–5.81)
RDW: 17.5 % — ABNORMAL HIGH (ref 11.5–15.5)
WBC: 12.4 10*3/uL — ABNORMAL HIGH (ref 4.0–10.5)
nRBC: 0 % (ref 0.0–0.2)

## 2019-11-06 LAB — CBC WITH DIFFERENTIAL/PLATELET
Abs Immature Granulocytes: 0.03 10*3/uL (ref 0.00–0.07)
Basophils Absolute: 0.1 10*3/uL (ref 0.0–0.1)
Basophils Relative: 1 %
Eosinophils Absolute: 0.2 10*3/uL (ref 0.0–0.5)
Eosinophils Relative: 2 %
HCT: 26.8 % — ABNORMAL LOW (ref 39.0–52.0)
Hemoglobin: 7.2 g/dL — ABNORMAL LOW (ref 13.0–17.0)
Immature Granulocytes: 0 %
Lymphocytes Relative: 17 %
Lymphs Abs: 1.8 10*3/uL (ref 0.7–4.0)
MCH: 19.3 pg — ABNORMAL LOW (ref 26.0–34.0)
MCHC: 26.9 g/dL — ABNORMAL LOW (ref 30.0–36.0)
MCV: 71.8 fL — ABNORMAL LOW (ref 80.0–100.0)
Monocytes Absolute: 0.8 10*3/uL (ref 0.1–1.0)
Monocytes Relative: 8 %
Neutro Abs: 8.1 10*3/uL — ABNORMAL HIGH (ref 1.7–7.7)
Neutrophils Relative %: 72 %
Platelets: 758 10*3/uL — ABNORMAL HIGH (ref 150–400)
RBC: 3.73 MIL/uL — ABNORMAL LOW (ref 4.22–5.81)
RDW: 17.3 % — ABNORMAL HIGH (ref 11.5–15.5)
WBC: 11.1 10*3/uL — ABNORMAL HIGH (ref 4.0–10.5)
nRBC: 0.3 % — ABNORMAL HIGH (ref 0.0–0.2)

## 2019-11-06 LAB — PREPARE RBC (CROSSMATCH)

## 2019-11-06 LAB — BRAIN NATRIURETIC PEPTIDE: B Natriuretic Peptide: 37.2 pg/mL (ref 0.0–100.0)

## 2019-11-06 LAB — POC OCCULT BLOOD, ED: Fecal Occult Bld: POSITIVE — AB

## 2019-11-06 LAB — HIV ANTIBODY (ROUTINE TESTING W REFLEX): HIV Screen 4th Generation wRfx: NONREACTIVE

## 2019-11-06 LAB — SARS CORONAVIRUS 2 BY RT PCR (HOSPITAL ORDER, PERFORMED IN ~~LOC~~ HOSPITAL LAB): SARS Coronavirus 2: NEGATIVE

## 2019-11-06 MED ORDER — SODIUM CHLORIDE 0.9% IV SOLUTION: Freq: Once | INTRAVENOUS | Status: AC

## 2019-11-06 MED ORDER — ONDANSETRON HCL 4 MG/2ML IJ SOLN: 4.0000 mg | Freq: Four times a day (QID) | INTRAMUSCULAR | Status: DC | PRN

## 2019-11-06 MED ORDER — CARVEDILOL 6.25 MG PO TABS
9.3750 mg | ORAL_TABLET | Freq: Two times a day (BID) | ORAL | Status: DC
  Administered 2019-11-06 – 2019-11-08 (×4): 9.375 mg via ORAL
  Filled 2019-11-06 (×4): qty 1

## 2019-11-06 MED ORDER — LACTATED RINGERS IV SOLN: INTRAVENOUS | Status: DC

## 2019-11-06 MED ORDER — ALBUTEROL SULFATE (2.5 MG/3ML) 0.083% IN NEBU: 3.0000 mL | INHALATION_SOLUTION | Freq: Four times a day (QID) | RESPIRATORY_TRACT | Status: DC | PRN

## 2019-11-06 MED ORDER — ATORVASTATIN CALCIUM 40 MG PO TABS
40.0000 mg | ORAL_TABLET | Freq: Every day | ORAL | Status: DC
  Administered 2019-11-07: 40 mg via ORAL
  Filled 2019-11-06: qty 1

## 2019-11-06 MED ORDER — PANTOPRAZOLE SODIUM 40 MG IV SOLR
40.0000 mg | Freq: Once | INTRAVENOUS | Status: AC
  Administered 2019-11-06: 40 mg via INTRAVENOUS
  Filled 2019-11-06: qty 40

## 2019-11-06 MED ORDER — ACETAMINOPHEN 650 MG RE SUPP: 650.0000 mg | Freq: Four times a day (QID) | RECTAL | Status: DC | PRN

## 2019-11-06 MED ORDER — SACUBITRIL-VALSARTAN 97-103 MG PO TABS
1.0000 | ORAL_TABLET | Freq: Two times a day (BID) | ORAL | Status: DC
  Administered 2019-11-06 – 2019-11-07 (×3): 1 via ORAL
  Filled 2019-11-06 (×5): qty 1

## 2019-11-06 MED ORDER — ONDANSETRON HCL 4 MG PO TABS: 4.0000 mg | ORAL_TABLET | Freq: Four times a day (QID) | ORAL | Status: DC | PRN

## 2019-11-06 MED ORDER — ACETAMINOPHEN 325 MG PO TABS
650.0000 mg | ORAL_TABLET | Freq: Four times a day (QID) | ORAL | Status: DC | PRN
  Administered 2019-11-07 – 2019-11-08 (×2): 650 mg via ORAL
  Filled 2019-11-06 (×2): qty 2

## 2019-11-06 MED ORDER — MOMETASONE FURO-FORMOTEROL FUM 200-5 MCG/ACT IN AERO
2.0000 | INHALATION_SPRAY | Freq: Two times a day (BID) | RESPIRATORY_TRACT | Status: DC
  Administered 2019-11-07 – 2019-11-08 (×3): 2 via RESPIRATORY_TRACT
  Filled 2019-11-06: qty 8.8

## 2019-11-06 MED ORDER — FAMOTIDINE IN NACL 20-0.9 MG/50ML-% IV SOLN
20.0000 mg | Freq: Once | INTRAVENOUS | Status: AC
  Administered 2019-11-06: 20 mg via INTRAVENOUS
  Filled 2019-11-06: qty 50

## 2019-11-06 MED ORDER — PANTOPRAZOLE SODIUM 40 MG IV SOLR
40.0000 mg | Freq: Two times a day (BID) | INTRAVENOUS | Status: DC
  Administered 2019-11-06: 40 mg via INTRAVENOUS
  Filled 2019-11-06: qty 40

## 2019-11-06 MED ORDER — IPRATROPIUM BROMIDE 0.02 % IN SOLN
0.5000 mg | Freq: Two times a day (BID) | RESPIRATORY_TRACT | Status: DC
  Administered 2019-11-06 – 2019-11-07 (×2): 0.5 mg via RESPIRATORY_TRACT
  Filled 2019-11-06 (×3): qty 2.5

## 2019-11-06 NOTE — ED Provider Notes (Signed)
This patient is a 69 year old male, he goes to [facility] [place].  He has a known history of congestive heart failure, he also has a history of peptic ulcer disease and a supposed be taking a proton pump inhibitor though he is not taking it at this time.  Presents with report of being anemic, after a month or so of feeling very dyspneic on exertion and very fatigued on exertion he went to his family doctor who measured his hemoglobin and told him it was around 6.  On exam the patient is pale in the mucous membranes, tachycardic to 105 bpm without edema.  Abdomen is nontender, lungs are clear otherwise.  The patient is tachycardic and anemic with a hemoglobin of just over 7, will need a blood transfusion, he is critically ill with severe anemia.  He will need Hemoccult, likely gastroenterology consultation and admission to the hospitalist.  Procedures CRITICAL CARE Performed by: Vida Roller Total critical care time: 35 minutes Critical care time was exclusive of separately billable procedures and treating other patients. Critical care was necessary to treat or prevent imminent or life-threatening deterioration. Critical care was time spent personally by me on the following activities: development of treatment plan with patient and/or surrogate as well as nursing, discussions with consultants, evaluation of patient's response to treatment, examination of patient, obtaining history from patient or surrogate, ordering and performing treatments and interventions, ordering and review of laboratory studies, ordering and review of radiographic studies, pulse oximetry and re-evaluation of patient's condition.   Medical screening examination/treatment/procedure(s) were conducted as a shared visit with non-physician practitioner(s) and myself.  I personally evaluated the patient during the encounter.  Clinical Impression:   Final diagnoses:  Symptomatic anemia         Eber Hong, MD 11/07/19 [phone]

## 2019-11-06 NOTE — ED Provider Notes (Signed)
[facility] EMERGENCY DEPARTMENT Provider Note   CSN: [id number] Arrival date & time: 11/06/19  1700     History Chief Complaint  Patient presents with  . Abnormal Lab    Kristopher [name] is a 69 y.o. male with PMH significant for acute upper GI bleed, PUD, LBBB, CAD, and CHF who presents to the ED for abnormal lab value.  Patient reportedly was seen by his primary care provider for a well check visit 11/04/2019 and he was subsequently informed that his hemoglobin was low at 6.  I reviewed patient's medical record and he was admitted on 08/10/2016 for symptomatic anemia due to upper GI bleed.  Esophagitis and multiple nonbleeding gastric ulcers were identified on EGD.  He was recommended to have repeat upper endoscopy in 3 months, but has not since had upper endoscopy performed.  He does not take any proton pump inhibitors or other antacids regularly, but denies symptoms of heartburn.  Patient states that he has been dyspneic with exertion since his last admission in 2020, but states that his fatigue has been worsened the past 7 to 10 days.  Most recent echocardiogram performed 11/13/2018 demonstrates EF 25 to 30%.  Patient denies any recent illness, fevers or chills, chest pain, abdominal pain, orthopnea, nausea or vomiting, melena or hematochezia, urinary symptoms, or other symptoms.  He has a home health nurse which aids with his medications.  He states that roughly 1 month ago he had acute onset weight gain due to inadequate Lasix at home, but that has since been remedied.  HPI     Past Medical History:  Diagnosis Date  . Acute combined systolic and diastolic heart failure (HCC) 08/11/2016  . Acute esophagitis 08/17/2016  . Acute upper GI bleed 08/10/2016  . Asthma   . CAD (coronary artery disease) 08/16/2016  . CHF (congestive heart failure) (HCC)   . DCM (dilated cardiomyopathy) (HCC)   . Dyspnea   . HTN (hypertension)   . LBBB (left bundle branch block) 08/10/2016  .  Lymphadenopathy 08/17/2016  . OSA (obstructive sleep apnea) 11/18/2018   [place] home sleep study revealed Severe Obstructive Sleep Apnea with AHI 65.8/hr. Central sleep apnea was noted with an pAHIc of 11.4/hr. % Cheyne Stokes respirations was 10.3%. Nocturnal Hypoxemia was noted with O2 saturations as low as 72%. Time spent with Oxygen desaturations < 88% was 97.6 minutes.  . PUD (peptic ulcer disease) 08/17/2016  . Pulmonary HTN (HCC) 08/17/2016    Patient Active Problem List   Diagnosis Date Noted  . OSA (obstructive sleep apnea) 11/18/2018  . Morbid obesity (HCC) 11/18/2018  . Acute on chronic combined systolic and diastolic congestive heart failure, NYHA class 4 (HCC) 07/21/2018  . PUD (peptic ulcer disease) 08/17/2016  . Acute esophagitis 08/17/2016  . Pulmonary HTN (HCC) 08/17/2016  . Lymphadenopathy 08/17/2016  . CAD (coronary artery disease) 08/16/2016  . Anemia   . DCM (dilated cardiomyopathy) (HCC)   . HTN (hypertension)   . Chronic combined systolic and diastolic heart failure (HCC) 08/11/2016  . Acute blood loss anemia 08/11/2016  . Acute upper GI bleed 08/10/2016  . DOE (dyspnea on exertion) 08/10/2016  . LBBB (left bundle branch block) 08/10/2016    Past Surgical History:  Procedure Laterality Date  . ESOPHAGOGASTRODUODENOSCOPY (EGD) WITH PROPOFOL Left 08/11/2016   Procedure: ESOPHAGOGASTRODUODENOSCOPY (EGD) WITH PROPOFOL;  Surgeon: Willis Modena, MD;  Location: [facility] ENDOSCOPY;  Service: Endoscopy;  Laterality: Left;  . RIGHT/LEFT HEART CATH AND CORONARY ANGIOGRAPHY N/A 08/15/2016  Procedure: Right/Left Heart Cath and Coronary Angiography;  Surgeon: Corky Crafts, MD;  Location: [facility] INVASIVE CV LAB;  Service: Cardiovascular;  Laterality: N/A;  . RIGHT/LEFT HEART CATH AND CORONARY ANGIOGRAPHY N/A 08/04/2018   Procedure: RIGHT/LEFT HEART CATH AND CORONARY ANGIOGRAPHY;  Surgeon: Dolores Patty, MD;  Location: [facility] INVASIVE CV LAB;  Service: Cardiovascular;  Laterality: N/A;         Family History  Problem Relation Age of Onset  . Hypertension Mother   . Hypertension Sister     Social History   Tobacco Use  . Smoking status: Former Smoker    Packs/day: 0.50    Years: 44.00    Pack years: 22.00    Types: Cigarettes    Quit date: 06/11/1998    Years since quitting: 21.4  . Smokeless tobacco: Never Used  Substance Use Topics  . Alcohol use: No  . Drug use: No    Home Medications Prior to Admission medications   Medication Sig Start Date End Date Taking? Authorizing Provider  acetaminophen (TYLENOL) 325 MG tablet Take 650 mg by mouth every 6 (six) hours as needed for mild pain.    Yes [provider]  albuterol (PROVENTIL) (2.5 MG/3ML) 0.083% nebulizer solution Inhale 3 mLs into the lungs every 6 (six) hours as needed for wheezing or shortness of breath. 06/20/18  Yes [provider]  aspirin 81 MG chewable tablet Chew 1 tablet (81 mg total) by mouth daily. 09/15/18  Yes Alford Highland, NP  atorvastatin (LIPITOR) 40 MG tablet TAKE 1 TABLET BY MOUTH ONCE DAILY AT 6PM Patient taking differently: Take 40 mg by mouth daily.  10/13/19  Yes Bensimhon, Bevelyn Buckles, MD  carvedilol (COREG) 6.25 MG tablet Take 1.5 tablets (9.375 mg total) by mouth 2 (two) times daily with a meal for 30 days. 12/04/18 11/06/19 Yes Bensimhon, Bevelyn Buckles, MD  ferrous sulfate 325 (65 FE) MG tablet Take 325 mg by mouth daily with breakfast.   Yes [provider]  furosemide (LASIX) 40 MG tablet Take 2 tablets (80 mg total) by mouth 2 (two) times daily. 09/14/19  Yes Bensimhon, Bevelyn Buckles, MD  ipratropium (ATROVENT) 0.02 % nebulizer solution Take 2.5 mLs (0.5 mg total) by nebulization 2 (two) times daily. 08/08/18  Yes Zigmund Daniel., MD  omeprazole (PRILOSEC) 40 MG capsule Take 40 mg by mouth daily as needed (gerd).    Yes [provider]  potassium chloride (KLOR-CON) 10 MEQ tablet Take 1 tablet (10 mEq total) by mouth daily. 06/29/19  Yes Bensimhon, Bevelyn Buckles, MD  sacubitril-valsartan (ENTRESTO) 97-103 MG Take 1 tablet by mouth 2 (two) times daily. 11/13/18  Yes Bensimhon, Bevelyn Buckles, MD  spironolactone (ALDACTONE) 25 MG tablet Take 1 tablet (25 mg total) by mouth daily. 03/16/19  Yes Bensimhon, Bevelyn Buckles, MD  SYMBICORT 160-4.5 MCG/ACT inhaler Inhale 1 puff into the lungs in the morning and at bedtime. 09/08/19  Yes [provider]    Allergies    Penicillins  Review of Systems   Review of Systems  All other systems reviewed and are negative.   Physical Exam Updated Vital Signs BP (!) 111/54   Pulse 76   Temp 98.2 F (36.8 C) (Oral)   Resp (!) 26   Ht 5\' 7"  (1.702 m)   Wt 104.3 kg   SpO2 99%   BMI 36.02 kg/m   Physical Exam Vitals and nursing note reviewed. Exam conducted with a chaperone present.  Constitutional:  Appearance: Normal appearance.  HENT:     Head: Normocephalic and atraumatic.  Eyes:     General: No scleral icterus.    Conjunctiva/sclera: Conjunctivae normal.  Neck:     Comments: No JVD. Cardiovascular:     Rate and Rhythm: Normal rate and regular rhythm.     Pulses: Normal pulses.     Heart sounds: Normal heart sounds.  Pulmonary:     Comments: Mild increased WOB with exertion.  No distress.  Symmetric chest rise.  Breath sounds intact bilaterally. Abdominal:     Comments: Soft, nondistended.  Nontender to palpation.  Umbilical hernia noted.  No overlying skin changes.  Musculoskeletal:     Cervical back: Normal range of motion. No rigidity.     Right lower leg: No edema.     Left lower leg: No edema.     Comments: No pitting edema bilaterally.  Skin:    General: Skin is dry.     Capillary Refill: Capillary refill takes less than 2 seconds.  Neurological:     Mental Status: He is alert.     GCS: GCS eye subscore is 4. GCS verbal subscore is 5. GCS motor subscore is 6.  Psychiatric:        Mood and Affect: Mood normal.        Behavior: Behavior normal.        Thought Content: Thought  content normal.     ED Results / Procedures / Treatments   Labs (all labs ordered are listed, but only abnormal results are displayed) Labs Reviewed  COMPREHENSIVE METABOLIC PANEL - Abnormal; Notable for the following components:      Result Value   Chloride 97 (*)    Glucose, Bld 119 (*)    Creatinine, Ser 1.47 (*)    AST 14 (*)    GFR calc non Af Amer 48 (*)    GFR calc Af Amer 56 (*)    All other components within normal limits  CBC WITH DIFFERENTIAL/PLATELET - Abnormal; Notable for the following components:   WBC 11.1 (*)    RBC 3.73 (*)    Hemoglobin 7.2 (*)    HCT 26.8 (*)    MCV 71.8 (*)    MCH 19.3 (*)    MCHC 26.9 (*)    RDW 17.3 (*)    Platelets 758 (*)    nRBC 0.3 (*)    Neutro Abs 8.1 (*)    All other components within normal limits  POC OCCULT BLOOD, ED - Abnormal; Notable for the following components:   Fecal Occult Bld POSITIVE (*)    All other components within normal limits  SARS CORONAVIRUS 2 BY RT PCR (HOSPITAL ORDER, [place] [facility])  BRAIN NATRIURETIC PEPTIDE  OCCULT BLOOD X 1 CARD TO LAB, STOOL  TYPE AND SCREEN  PREPARE RBC (CROSSMATCH)    EKG None  Radiology No results found.  Procedures .Critical Care Performed by: Corena Herter, PA-C Authorized by: Corena Herter, PA-C   Critical care provider statement:    Critical care time (minutes):  45   Critical care was necessary to treat or prevent imminent or life-threatening deterioration of the following conditions:  Circulatory failure   Critical care was time spent personally by me on the following activities:  Discussions with consultants, evaluation of patient's response to treatment, examination of patient, ordering and performing treatments and interventions, ordering and review of laboratory studies, ordering and review of radiographic studies, pulse oximetry, re-evaluation  of patient's condition, obtaining history from patient or surrogate and review of old  charts Comments:     Symptomatic anemia requiring transfusion.   (including critical care time)  Medications Ordered in ED Medications  pantoprazole (PROTONIX) injection 40 mg (40 mg Intravenous Given 11/06/19 1055)  famotidine (PEPCID) IVPB 20 mg premix (0 mg Intravenous Stopped 11/06/19 1142)  0.9 %  sodium chloride infusion (Manually program via Guardrails IV Fluids) ( Intravenous Stopped 11/06/19 1214)    ED Course  I have reviewed the triage vital signs and the nursing notes.  Pertinent labs & imaging results that were available during my care of the patient were reviewed by me and considered in my medical decision making (see chart for details).  Clinical Course as of Nov 06 1222  Fri Nov 06, 2019  1022 Spoke with Dr. Levora Angel who will see patient.  Recommending admission to hospitalist services.    [GG]    Clinical Course User Index [GG] Lorelee New, PA-C   MDM Rules/Calculators/A&P                      Patient has hemoglobin of 7.2, decreased from baseline of 12.0.  CBC is consistent with a microcytic anemia.  Suspect GI bleed, particularly given history of GI bleed and PUD.  While he denies any melena or hematochezia, will obtain fecal occult.    Patient is fecal occult positive.  His microcytic anemia is consistent with GI bleed, likely upper.  He has never had a colonoscopy however and on examination states that he is adverse to the idea of having a colonoscopy.  Will discuss possible Cologuard testing.  He is hemodynamically stable and not in any acute distress.  Spoke with Dr. Levora Angel who will see patient.  Recommending admission to hospitalist services.  Will provide 1 unit PRBC.    Spoke with Dr. Ophelia Charter who will admit patient.  Final Clinical Impression(s) / ED Diagnoses Final diagnoses:  Symptomatic anemia    Rx / DC Orders ED Discharge Orders    None       Lorelee New, PA-C 11/06/19 1224    Eber Hong, MD 11/07/19 [phone]

## 2019-11-06 NOTE — H&P (View-Only) (Signed)
[facility] Gastroenterology Consultation Note  Referring Provider: No ref. provider found Primary Care Physician:  Shirlean Mylar, MD  HPI: Kristopher [name] is a 69 y.o. male presenting anemia.  Some weakness and fatigue.  No overt blood in stool.  No hematemesis.  No abdominal pain.  Prior history bleeding gastric ulcer few years ago.  Doesn't recall if he's had a colonoscopy.   Past Medical History:  Diagnosis Date  . Acute combined systolic and diastolic heart failure (HCC) 08/11/2016  . Acute esophagitis 08/17/2016  . Acute upper GI bleed 08/10/2016  . Asthma   . CAD (coronary artery disease) 08/16/2016  . CHF (congestive heart failure) (HCC)   . DCM (dilated cardiomyopathy) (HCC)   . Dyspnea   . HTN (hypertension)   . LBBB (left bundle branch block) 08/10/2016  . Lymphadenopathy 08/17/2016  . OSA (obstructive sleep apnea) 11/18/2018   [facility] home sleep study revealed Severe Obstructive Sleep Apnea with AHI 65.8/hr. Central sleep apnea was noted with an pAHIc of 11.4/hr. % Cheyne Stokes respirations was 10.3%. Nocturnal Hypoxemia was noted with O2 saturations as low as 72%. Time spent with Oxygen desaturations < 88% was 97.6 minutes.  . PUD (peptic ulcer disease) 08/17/2016  . Pulmonary HTN (HCC) 08/17/2016    Past Surgical History:  Procedure Laterality Date  . ESOPHAGOGASTRODUODENOSCOPY (EGD) WITH PROPOFOL Left 08/11/2016   Procedure: ESOPHAGOGASTRODUODENOSCOPY (EGD) WITH PROPOFOL;  Surgeon: Willis Modena, MD;  Location: [facility] ENDOSCOPY;  Service: Endoscopy;  Laterality: Left;  . RIGHT/LEFT HEART CATH AND CORONARY ANGIOGRAPHY N/A 08/15/2016   Procedure: Right/Left Heart Cath and Coronary Angiography;  Surgeon: Corky Crafts, MD;  Location: [facility] INVASIVE CV LAB;  Service: Cardiovascular;  Laterality: N/A;  . RIGHT/LEFT HEART CATH AND CORONARY ANGIOGRAPHY N/A 08/04/2018   Procedure: RIGHT/LEFT HEART CATH AND CORONARY ANGIOGRAPHY;  Surgeon: Dolores Patty, MD;  Location: [facility] INVASIVE CV LAB;  Service:  Cardiovascular;  Laterality: N/A;    Prior to Admission medications   Medication Sig Start Date End Date Taking? Authorizing Provider  acetaminophen (TYLENOL) 325 MG tablet Take 650 mg by mouth every 6 (six) hours as needed for mild pain.    Yes [provider]  albuterol (PROVENTIL) (2.5 MG/3ML) 0.083% nebulizer solution Inhale 3 mLs into the lungs every 6 (six) hours as needed for wheezing or shortness of breath. 06/20/18  Yes [provider]  aspirin 81 MG chewable tablet Chew 1 tablet (81 mg total) by mouth daily. 09/15/18  Yes Alford Highland, NP  atorvastatin (LIPITOR) 40 MG tablet TAKE 1 TABLET BY MOUTH ONCE DAILY AT 6PM Patient taking differently: Take 40 mg by mouth daily.  10/13/19  Yes Bensimhon, Bevelyn Buckles, MD  carvedilol (COREG) 6.25 MG tablet Take 1.5 tablets (9.375 mg total) by mouth 2 (two) times daily with a meal for 30 days. 12/04/18 11/06/19 Yes Bensimhon, Bevelyn Buckles, MD  ferrous sulfate 325 (65 FE) MG tablet Take 325 mg by mouth daily with breakfast.   Yes [provider]  furosemide (LASIX) 40 MG tablet Take 2 tablets (80 mg total) by mouth 2 (two) times daily. 09/14/19  Yes Bensimhon, Bevelyn Buckles, MD  ipratropium (ATROVENT) 0.02 % nebulizer solution Take 2.5 mLs (0.5 mg total) by nebulization 2 (two) times daily. 08/08/18  Yes Zigmund Daniel., MD  omeprazole (PRILOSEC) 40 MG capsule Take 40 mg by mouth daily as needed (gerd).    Yes [provider]  potassium chloride (KLOR-CON) 10 MEQ tablet Take 1 tablet (10 mEq total)  by mouth daily. 06/29/19  Yes Bensimhon, Shaune Pascal, MD  sacubitril-valsartan (ENTRESTO) 97-103 MG Take 1 tablet by mouth 2 (two) times daily. 11/13/18  Yes Bensimhon, Shaune Pascal, MD  spironolactone (ALDACTONE) 25 MG tablet Take 1 tablet (25 mg total) by mouth daily. 03/16/19  Yes Bensimhon, Shaune Pascal, MD  SYMBICORT 160-4.5 MCG/ACT inhaler Inhale 1 puff into the lungs in the morning and at bedtime. 09/08/19  Yes [provider]     Current Facility-Administered Medications  Medication Dose Route Frequency Provider Last Rate Last Admin  . acetaminophen (TYLENOL) tablet 650 mg  650 mg Oral Q6H PRN Karmen Bongo, MD       Or  . acetaminophen (TYLENOL) suppository 650 mg  650 mg Rectal Q6H PRN Karmen Bongo, MD      . albuterol (PROVENTIL) (2.5 MG/3ML) 0.083% nebulizer solution 3 mL  3 mL Inhalation Q6H PRN Karmen Bongo, MD      . Derrill Memo ON 11/07/2019] atorvastatin (LIPITOR) tablet 40 mg  40 mg Oral Daily Karmen Bongo, MD      . carvedilol (COREG) tablet 9.375 mg  9.375 mg Oral BID WC Karmen Bongo, MD      . ipratropium (ATROVENT) nebulizer solution 0.5 mg  0.5 mg Nebulization BID Karmen Bongo, MD      . lactated ringers infusion   Intravenous Continuous Karmen Bongo, MD      . mometasone-formoterol [facility]) 200-5 MCG/ACT inhaler 2 puff  2 puff Inhalation BID Karmen Bongo, MD      . ondansetron [facility]) tablet 4 mg  4 mg Oral Q6H PRN Karmen Bongo, MD       Or  . ondansetron [facility]) injection 4 mg  4 mg Intravenous Q6H PRN Karmen Bongo, MD      . sacubitril-valsartan (ENTRESTO) 97-103 mg per tablet  1 tablet Oral BID Karmen Bongo, MD       Current Outpatient Medications  Medication Sig Dispense Refill  . acetaminophen (TYLENOL) 325 MG tablet Take 650 mg by mouth every 6 (six) hours as needed for mild pain.     Marland Kitchen albuterol (PROVENTIL) (2.5 MG/3ML) 0.083% nebulizer solution Inhale 3 mLs into the lungs every 6 (six) hours as needed for wheezing or shortness of breath.    Marland Kitchen aspirin 81 MG chewable tablet Chew 1 tablet (81 mg total) by mouth daily. 30 tablet 6  . atorvastatin (LIPITOR) 40 MG tablet TAKE 1 TABLET BY MOUTH ONCE DAILY AT 6PM (Patient taking differently: Take 40 mg by mouth daily. ) 90 tablet 3  . carvedilol (COREG) 6.25 MG tablet Take 1.5 tablets (9.375 mg total) by mouth 2 (two) times daily with a meal for 30 days. 270 tablet 3  . ferrous sulfate 325 (65 FE) MG tablet Take 325 mg  by mouth daily with breakfast.    . furosemide (LASIX) 40 MG tablet Take 2 tablets (80 mg total) by mouth 2 (two) times daily. 120 tablet 3  . ipratropium (ATROVENT) 0.02 % nebulizer solution Take 2.5 mLs (0.5 mg total) by nebulization 2 (two) times daily. 75 mL 12  . omeprazole (PRILOSEC) 40 MG capsule Take 40 mg by mouth daily as needed (gerd).     . potassium chloride (KLOR-CON) 10 MEQ tablet Take 1 tablet (10 mEq total) by mouth daily. 30 tablet 3  . sacubitril-valsartan (ENTRESTO) 97-103 MG Take 1 tablet by mouth 2 (two) times daily. 60 tablet 6  . spironolactone (ALDACTONE) 25 MG tablet Take 1 tablet (25 mg total) by mouth daily. [place]  tablet 6  . SYMBICORT 160-4.5 MCG/ACT inhaler Inhale 1 puff into the lungs in the morning and at bedtime.      Allergies as of 11/06/2019 - Review Complete 11/06/2019  Allergen Reaction Noted  . Penicillins  02/10/2016    Family History  Problem Relation Age of Onset  . Hypertension Mother   . Hypertension Sister     Social History   Socioeconomic History  . Marital status: Single    Spouse name: Not on file  . Number of children: Not on file  . Years of education: Not on file  . Highest education level: Not on file  Occupational History  . Occupation: retired  Tobacco Use  . Smoking status: Former Smoker    Packs/day: 0.50    Years: 44.00    Pack years: 22.00    Types: Cigarettes    Quit date: 06/11/1998    Years since quitting: 21.4  . Smokeless tobacco: Never Used  Substance and Sexual Activity  . Alcohol use: Not Currently    Comment: h/o heavy use  . Drug use: Not Currently  . Sexual activity: Not on file  Other Topics Concern  . Not on file  Social History Narrative  . Not on file   Social Determinants of Health   Financial Resource Strain:   . Difficulty of Paying Living Expenses:   Food Insecurity:   . Worried About Running Out of Food in the Last Year:   . Ran Out of Food in the Last Year:   Transportation Needs:   .  Lack of Transportation (Medical):   . Lack of Transportation (Non-Medical):   Physical Activity:   . Days of Exercise per Week:   . Minutes of Exercise per Session:   Stress:   . Feeling of Stress :   Social Connections:   . Frequency of Communication with Friends and Family:   . Frequency of Social Gatherings with Friends and Family:   . Attends Religious Services:   . Active Member of Clubs or Organizations:   . Attends Club or Organization Meetings:   . Marital Status:   Intimate Partner Violence:   . Fear of Current or Ex-Partner:   . Emotionally Abused:   . Physically Abused:   . Sexually Abused:     Review of Systems: As per HPI, all others negative.  Physical Exam: Vital signs in last 24 hours: Temp:  [97.8 F (36.6 C)-98.2 F (36.8 C)] 98.2 F (36.8 C) (05/28 1220) Pulse Rate:  [73-104] 76 (05/28 1215) Resp:  [18-26] 26 (05/28 1215) BP: (100-125)/(48-63) 111/54 (05/28 1220) SpO2:  [93 %-100 %] 99 % (05/28 1215) Weight:  [104.3 kg] 104.3 kg (05/28 1008)   General:   Alert,  Overweight, Well-developed, well-nourished, pleasant and cooperative in NAD Head:  Normocephalic and atraumatic. Eyes:  Sclera clear, no icterus.   Conjunctiva pink. Ears:  Normal auditory acuity. Nose:  No deformity, discharge,  or lesions. Mouth:  No deformity or lesions.  Oropharynx pink & moist. Neck:  Supple; no masses or thyromegaly. Lungs:  Clear throughout to auscultation.   No wheezes, crackles, or rhonchi. No acute distress. Heart:  Regular rate and rhythm; no murmurs, clicks, rubs,  or gallops. Abdomen:  Soft, protuberant, nontender and nondistended. No masses, hepatosplenomegaly or hernias noted. Normal bowel sounds, without guarding, and without rebound.     Msk:  Symmetrical without gross deformities. Normal posture. Pulses:  Normal pulses noted. Extremities:  Without clubbing or edema. Neurologic:  Alert   and  oriented x4;  grossly normal neurologically. Skin:  Intact without  significant lesions or rashes. Cervical Nodes:  No significant cervical adenopathy. Psych:  Alert and cooperative. Normal mood and affect.   Lab Results: Recent Labs    11/06/19 0742  WBC 11.1*  HGB 7.2*  HCT 26.8*  PLT 758*   BMET Recent Labs    11/06/19 0742  NA 137  K 4.7  CL 97*  CO2 28  GLUCOSE 119*  BUN 22  CREATININE 1.47*  CALCIUM 9.6   LFT Recent Labs    11/06/19 0742  PROT 7.6  ALBUMIN 4.1  AST 14*  ALT 9  ALKPHOS 83  BILITOT 0.3   PT/INR No results for input(s): LABPROT, INR in the last 72 hours.  Studies/Results: No results found.  Impression:  1.  Anemia, microcytic. 2.  History gastric ulcers.  Plan:  1.  Transfuse PRBCs. 2.  PPI. 3.  Clear liquid diet, NPO after midnight. 4.  Endoscopy tomorrow. 5.  Risks (bleeding, infection, bowel perforation that could require surgery, sedation-related changes in cardiopulmonary systems), benefits (identification and possible treatment of source of symptoms, exclusion of certain causes of symptoms), and alternatives (watchful waiting, radiographic imaging studies, empiric medical treatment) of upper endoscopy (EGD) were explained to patient/family in detail and patient wishes to proceed. 6.  If endoscopy unrevealing, consider colonoscopy.   LOS: 0 days   Emilina Smarr M  11/06/2019, 2:36 PM  Cell [phone] If no answer or after 5 PM call [phone]

## 2019-11-06 NOTE — H&P (Signed)
History and Physical    Kristopher [name] ZLD:[id number] DOB: June 01, 1951 DOA: 11/06/2019  PCP: Shirlean Mylar, MD Consultants:  [facility] - pulmonology; Bensimhon - cardiology Patient coming from:  Home - lives with cousin; NOK: Daughter, Kristopher [name] [phone]) or Kristopher [name] [phone])  Chief Complaint: Fatigue  HPI: Kristopher [name] is a 69 y.o. male with medical history significant of  Pulmonary HTN; OSA; HTN; chronic combined CHF; and h/o GI bleed in 2018 presenting with fatigue.  He got to where he couldn't walk long.  Symptoms started a couple of weeks ago and seemed to wax and wane and seemed to progress.  The other night it was so bad they called 911 but didn't come in.  He went for a wellness check to his PCP and had blood work and they called back and said his Hgb was 6 and to come to the hospital.  He has chronic SOB due to CHF since 07/2018.  He had an UGI bleed in 2019.  He has never had a colonoscopy.    ED Course:   H/o sx anemia with UGI bleed, EGD with bleeding ulcers and esophagitis in 2018.  Has never had a c-scope.  Heme positive, Hgb 7.2.  GI to see.  Has felt weak, fatigued, PCP checked labs.  DOE since 2020, worse in the last week.  Transfusing 1 unit PRBC.  Review of Systems: As per HPI; otherwise review of systems reviewed and negative.   Ambulatory Status: Ambulates without assistance  COVID Vaccine Status:  None  Past Medical History:  Diagnosis Date  . Acute combined systolic and diastolic heart failure (HCC) 08/11/2016  . Acute esophagitis 08/17/2016  . Acute upper GI bleed 08/10/2016  . Asthma   . CAD (coronary artery disease) 08/16/2016  . CHF (congestive heart failure) (HCC)   . DCM (dilated cardiomyopathy) (HCC)   . Dyspnea   . HTN (hypertension)   . LBBB (left bundle branch block) 08/10/2016  . Lymphadenopathy 08/17/2016  . OSA (obstructive sleep apnea) 11/18/2018   [facility] home sleep study revealed Severe Obstructive Sleep Apnea with AHI 65.8/hr. Central sleep apnea was noted  with an pAHIc of 11.4/hr. % Cheyne Stokes respirations was 10.3%. Nocturnal Hypoxemia was noted with O2 saturations as low as 72%. Time spent with Oxygen desaturations < 88% was 97.6 minutes.  . PUD (peptic ulcer disease) 08/17/2016  . Pulmonary HTN (HCC) 08/17/2016    Past Surgical History:  Procedure Laterality Date  . ESOPHAGOGASTRODUODENOSCOPY (EGD) WITH PROPOFOL Left 08/11/2016   Procedure: ESOPHAGOGASTRODUODENOSCOPY (EGD) WITH PROPOFOL;  Surgeon: Willis Modena, MD;  Location: [facility] ENDOSCOPY;  Service: Endoscopy;  Laterality: Left;  . RIGHT/LEFT HEART CATH AND CORONARY ANGIOGRAPHY N/A 08/15/2016   Procedure: Right/Left Heart Cath and Coronary Angiography;  Surgeon: Corky Crafts, MD;  Location: [facility] INVASIVE CV LAB;  Service: Cardiovascular;  Laterality: N/A;  . RIGHT/LEFT HEART CATH AND CORONARY ANGIOGRAPHY N/A 08/04/2018   Procedure: RIGHT/LEFT HEART CATH AND CORONARY ANGIOGRAPHY;  Surgeon: Dolores Patty, MD;  Location: [facility] INVASIVE CV LAB;  Service: Cardiovascular;  Laterality: N/A;    Social History   Socioeconomic History  . Marital status: Single    Spouse name: Not on file  . Number of children: Not on file  . Years of education: Not on file  . Highest education level: Not on file  Occupational History  . Occupation: retired  Tobacco Use  . Smoking status: Former Smoker    Packs/day: 0.50    Years: 44.00    Pack  years: 22.00    Types: Cigarettes    Quit date: 06/11/1998    Years since quitting: 21.4  . Smokeless tobacco: Never Used  Substance and Sexual Activity  . Alcohol use: Not Currently    Comment: h/o heavy use  . Drug use: Not Currently  . Sexual activity: Not on file  Other Topics Concern  . Not on file  Social History Narrative  . Not on file   Social Determinants of Health   Financial Resource Strain:   . Difficulty of Paying Living Expenses:   Food Insecurity:   . Worried About Charity fundraiser in the Last Year:   . Arboriculturist in the Last  Year:   Transportation Needs:   . Film/video editor (Medical):   Marland Kitchen Lack of Transportation (Non-Medical):   Physical Activity:   . Days of Exercise per Week:   . Minutes of Exercise per Session:   Stress:   . Feeling of Stress :   Social Connections:   . Frequency of Communication with Friends and Family:   . Frequency of Social Gatherings with Friends and Family:   . Attends Religious Services:   . Active Member of Clubs or Organizations:   . Attends Archivist Meetings:   Marland Kitchen Marital Status:   Intimate Partner Violence:   . Fear of Current or Ex-Partner:   . Emotionally Abused:   Marland Kitchen Physically Abused:   . Sexually Abused:     Allergies  Allergen Reactions  . Penicillins     Pt reports it makes him feel shaky Has patient had a PCN reaction causing immediate rash, facial/tongue/throat swelling, SOB or lightheadedness with hypotension: YES Has patient had a PCN reaction causing severe rash involving mucus membranes or skin necrosis: NO Has patient had a PCN reaction that required hospitalization NO Has patient had a PCN reaction occurring within the last 10 years: NO If all of the above answers are "NO", then may proceed with Cephalosporin use.    Family History  Problem Relation Age of Onset  . Hypertension Mother   . Hypertension Sister     Prior to Admission medications   Medication Sig Start Date End Date Taking? Authorizing Provider  acetaminophen (TYLENOL) 325 MG tablet Take 650 mg by mouth every 6 (six) hours as needed for mild pain.    Yes [provider]  albuterol (PROVENTIL) (2.5 MG/3ML) 0.083% nebulizer solution Inhale 3 mLs into the lungs every 6 (six) hours as needed for wheezing or shortness of breath. 06/20/18  Yes [provider]  aspirin 81 MG chewable tablet Chew 1 tablet (81 mg total) by mouth daily. 09/15/18  Yes Georgiana Shore, NP  atorvastatin (LIPITOR) 40 MG tablet TAKE 1 TABLET BY MOUTH ONCE DAILY AT 6PM Patient taking  differently: Take 40 mg by mouth daily.  10/13/19  Yes Bensimhon, Shaune Pascal, MD  carvedilol (COREG) 6.25 MG tablet Take 1.5 tablets (9.375 mg total) by mouth 2 (two) times daily with a meal for 30 days. 12/04/18 11/06/19 Yes Bensimhon, Shaune Pascal, MD  ferrous sulfate 325 (65 FE) MG tablet Take 325 mg by mouth daily with breakfast.   Yes [provider]  furosemide (LASIX) 40 MG tablet Take 2 tablets (80 mg total) by mouth 2 (two) times daily. 09/14/19  Yes Bensimhon, Shaune Pascal, MD  ipratropium (ATROVENT) 0.02 % nebulizer solution Take 2.5 mLs (0.5 mg total) by nebulization 2 (two) times daily. 08/08/18  Yes Alben Deeds  Montez Hageman., MD  omeprazole (PRILOSEC) 40 MG capsule Take 40 mg by mouth daily as needed (gerd).    Yes [provider]  potassium chloride (KLOR-CON) 10 MEQ tablet Take 1 tablet (10 mEq total) by mouth daily. 06/29/19  Yes Bensimhon, Bevelyn Buckles, MD  sacubitril-valsartan (ENTRESTO) 97-103 MG Take 1 tablet by mouth 2 (two) times daily. 11/13/18  Yes Bensimhon, Bevelyn Buckles, MD  spironolactone (ALDACTONE) 25 MG tablet Take 1 tablet (25 mg total) by mouth daily. 03/16/19  Yes Bensimhon, Bevelyn Buckles, MD  SYMBICORT 160-4.5 MCG/ACT inhaler Inhale 1 puff into the lungs in the morning and at bedtime. 09/08/19  Yes [provider]    Physical Exam: Vitals:   11/06/19 1215 11/06/19 1220 11/06/19 1345 11/06/19 1415  BP: 117/62 (!) 111/54 122/68 99/66  Pulse: 76  77 77  Resp: (!) 26  19 (!) 27  Temp:  98.2 F (36.8 C)    TempSrc:  Oral    SpO2: 99%  97% 98%  Weight:      Height:         . General:  Appears calm and comfortable and is NAD . Eyes:  PERRL, EOMI, normal lids, iris . ENT:  grossly normal hearing, lips & tongue, mmm; mostly absent dentition . Neck:  no LAD, masses or thyromegaly . Cardiovascular:  RRR, no m/r/g. No LE edema.  Marland Kitchen Respiratory:   CTA bilaterally with no wheezes/rales/rhonchi.  Normal respiratory effort. . Abdomen:  soft, NT, ND, NABS . Skin:  no rash or  induration seen on limited exam . Musculoskeletal:  grossly normal tone BUE/BLE, good ROM, no bony abnormality . Psychiatric:  grossly normal mood and affect, speech fluent and appropriate, AOx3 . Neurologic:  CN 2-12 grossly intact, moves all extremities in coordinated fashion    Radiological Exams on Admission: No results found.  EKG: Independently reviewed. NSR with dropped beat with rate 75, new from prior; LBBB with no evidence of acute ischemia   Labs on Admission: I have personally reviewed the available labs and imaging studies at the time of the admission.  Pertinent labs:   BUN 22/Creatinine 1.47/GFR 56 - stable BNP 37.2 WBC 11.1 Hgb 7.2; 12.0 on 3/4 Platelets 758; 402 on 3/4 Heme positive COVID negative   Assessment/Plan Principal Problem:   Symptomatic anemia Active Problems:   Acute upper GI bleed   Chronic combined systolic and diastolic heart failure (HCC)   HTN (hypertension)   OSA (obstructive sleep apnea)   Morbid obesity (HCC)   Thrombocytosis (HCC)    Symptomatic anemia -Patient with prior h/o remote UGI bleeding, now presenting with fatigue and apparent symptomatic anemia -Hgb 7.2 -Iron deficiency anemia with heme positive stools, likely occult GI bleed -Will admit to med surg bed. -Transfuse 1 unit PRBC to start and recheck Hgb afterwards.    Probable upper GI bleed -Prior UGI bleed in 08/2016; EGD showed grade D esophagitis and 2 non-bleeding cratered gastric ulcers (also medium-sized hiatal hernia) -Type and screen were done in ED.  -One unit of blood was ordered by ED.  -will admit to med surg bed -GI consulted by ED, will follow up recommendations -NPO after midnight for EGD -LR at 75 mL/hr -Start IV pantoprazole 40 mg bid -Has never had colonoscopy; he is likely to need inpatient vs. Outpatient  Thrombocytosis -May be reactive -Also elevated (but significantly less so) on last check -Recheck CBC this afternoon and tomorrow  AM  Chronic combined CHF -11/13/18 echo with EF 25-30% and indeterminate  diastolic function -Appears compensated -Monitor closely given IVF hydration in the setting of GI bleed -Hold ASA for now -Hold Lasix, Aldactone -Continue Entresto  HTN -Continue Coreg, Entresto  HLD -Continue Lipitor  OSA -Will order CPAP  Obesity Body mass index is 36.02 kg/m. -Weight loss should be encouraged -Outpatient PCP/bariatric medicine/bariatric surgery f/u encouraged  COPD with remote h/o tobacco abuse -Continue Albuterol and Atrovent -Continue Symbicort    Note: This patient has been tested and is negative for the novel coronavirus COVID-19.  DVT prophylaxis:  SCDs Code Status:  Full - confirmed with patient Family Communication: None present; he did not ask that I contact his daughters at the time of admission Disposition Plan:  The patient is from: home  Anticipated d/c is to: home without [facility] services  Anticipated d/c date will depend on clinical response to treatment, likely 1-2 days  Patient is currently: acutely ill Consults called: GI Admission status:  Admit - It is my clinical opinion that admission to INPATIENT is reasonable and necessary because of the expectation that this patient will require hospital care that crosses at least 2 midnights to treat this condition based on the medical complexity of the problems presented.  Given the aforementioned information, the predictability of an adverse outcome is felt to be significant.    Jonah Blue MD [facility]   How to contact the [facility] Attending or Consulting provider 7A - 7P or covering provider during after hours 7P -7A, for this patient?  1. Check the care team in [facility] and look for a) attending/consulting [facility] provider listed and b) the [facility] team listed 2. Log into [email or web address] and use [place]'s universal password to access. If you do not have the password, please contact the hospital operator. 3. Locate the [facility]  provider you are looking for under [facility] and page to a number that you can be directly reached. 4. If you still have difficulty reaching the provider, please page the [facility] (Director on Call) for the Hospitalists listed on amion for assistance.   11/06/2019, 3:03 PM

## 2019-11-06 NOTE — Consult Note (Signed)
[facility] Gastroenterology Consultation Note  Referring Provider: No ref. provider found Primary Care Physician:  Shirlean Mylar, MD  HPI: Kristopher [name] is a 69 y.o. male presenting anemia.  Some weakness and fatigue.  No overt blood in stool.  No hematemesis.  No abdominal pain.  Prior history bleeding gastric ulcer few years ago.  Doesn't recall if he's had a colonoscopy.   Past Medical History:  Diagnosis Date  . Acute combined systolic and diastolic heart failure (HCC) 08/11/2016  . Acute esophagitis 08/17/2016  . Acute upper GI bleed 08/10/2016  . Asthma   . CAD (coronary artery disease) 08/16/2016  . CHF (congestive heart failure) (HCC)   . DCM (dilated cardiomyopathy) (HCC)   . Dyspnea   . HTN (hypertension)   . LBBB (left bundle branch block) 08/10/2016  . Lymphadenopathy 08/17/2016  . OSA (obstructive sleep apnea) 11/18/2018   [place] home sleep study revealed Severe Obstructive Sleep Apnea with AHI 65.8/hr. Central sleep apnea was noted with an pAHIc of 11.4/hr. % Cheyne Stokes respirations was 10.3%. Nocturnal Hypoxemia was noted with O2 saturations as low as 72%. Time spent with Oxygen desaturations < 88% was 97.6 minutes.  . PUD (peptic ulcer disease) 08/17/2016  . Pulmonary HTN (HCC) 08/17/2016    Past Surgical History:  Procedure Laterality Date  . ESOPHAGOGASTRODUODENOSCOPY (EGD) WITH PROPOFOL Left 08/11/2016   Procedure: ESOPHAGOGASTRODUODENOSCOPY (EGD) WITH PROPOFOL;  Surgeon: Willis Modena, MD;  Location: [facility] ENDOSCOPY;  Service: Endoscopy;  Laterality: Left;  . RIGHT/LEFT HEART CATH AND CORONARY ANGIOGRAPHY N/A 08/15/2016   Procedure: Right/Left Heart Cath and Coronary Angiography;  Surgeon: Corky Crafts, MD;  Location: [facility] INVASIVE CV LAB;  Service: Cardiovascular;  Laterality: N/A;  . RIGHT/LEFT HEART CATH AND CORONARY ANGIOGRAPHY N/A 08/04/2018   Procedure: RIGHT/LEFT HEART CATH AND CORONARY ANGIOGRAPHY;  Surgeon: Dolores Patty, MD;  Location: [facility] INVASIVE CV LAB;  Service:  Cardiovascular;  Laterality: N/A;    Prior to Admission medications   Medication Sig Start Date End Date Taking? Authorizing Provider  acetaminophen (TYLENOL) 325 MG tablet Take 650 mg by mouth every 6 (six) hours as needed for mild pain.    Yes [provider]  albuterol (PROVENTIL) (2.5 MG/3ML) 0.083% nebulizer solution Inhale 3 mLs into the lungs every 6 (six) hours as needed for wheezing or shortness of breath. 06/20/18  Yes [provider]  aspirin 81 MG chewable tablet Chew 1 tablet (81 mg total) by mouth daily. 09/15/18  Yes Alford Highland, NP  atorvastatin (LIPITOR) 40 MG tablet TAKE 1 TABLET BY MOUTH ONCE DAILY AT 6PM Patient taking differently: Take 40 mg by mouth daily.  10/13/19  Yes Bensimhon, Bevelyn Buckles, MD  carvedilol (COREG) 6.25 MG tablet Take 1.5 tablets (9.375 mg total) by mouth 2 (two) times daily with a meal for 30 days. 12/04/18 11/06/19 Yes Bensimhon, Bevelyn Buckles, MD  ferrous sulfate 325 (65 FE) MG tablet Take 325 mg by mouth daily with breakfast.   Yes [provider]  furosemide (LASIX) 40 MG tablet Take 2 tablets (80 mg total) by mouth 2 (two) times daily. 09/14/19  Yes Bensimhon, Bevelyn Buckles, MD  ipratropium (ATROVENT) 0.02 % nebulizer solution Take 2.5 mLs (0.5 mg total) by nebulization 2 (two) times daily. 08/08/18  Yes Zigmund Daniel., MD  omeprazole (PRILOSEC) 40 MG capsule Take 40 mg by mouth daily as needed (gerd).    Yes [provider]  potassium chloride (KLOR-CON) 10 MEQ tablet Take 1 tablet (10 mEq total)  by mouth daily. 06/29/19  Yes Bensimhon, Shaune Pascal, MD  sacubitril-valsartan (ENTRESTO) 97-103 MG Take 1 tablet by mouth 2 (two) times daily. 11/13/18  Yes Bensimhon, Shaune Pascal, MD  spironolactone (ALDACTONE) 25 MG tablet Take 1 tablet (25 mg total) by mouth daily. 03/16/19  Yes Bensimhon, Shaune Pascal, MD  SYMBICORT 160-4.5 MCG/ACT inhaler Inhale 1 puff into the lungs in the morning and at bedtime. 09/08/19  Yes [provider]     Current Facility-Administered Medications  Medication Dose Route Frequency Provider Last Rate Last Admin  . acetaminophen (TYLENOL) tablet 650 mg  650 mg Oral Q6H PRN Karmen Bongo, MD       Or  . acetaminophen (TYLENOL) suppository 650 mg  650 mg Rectal Q6H PRN Karmen Bongo, MD      . albuterol (PROVENTIL) (2.5 MG/3ML) 0.083% nebulizer solution 3 mL  3 mL Inhalation Q6H PRN Karmen Bongo, MD      . Derrill Memo ON 11/07/2019] atorvastatin (LIPITOR) tablet 40 mg  40 mg Oral Daily Karmen Bongo, MD      . carvedilol (COREG) tablet 9.375 mg  9.375 mg Oral BID WC Karmen Bongo, MD      . ipratropium (ATROVENT) nebulizer solution 0.5 mg  0.5 mg Nebulization BID Karmen Bongo, MD      . lactated ringers infusion   Intravenous Continuous Karmen Bongo, MD      . mometasone-formoterol [facility]) 200-5 MCG/ACT inhaler 2 puff  2 puff Inhalation BID Karmen Bongo, MD      . ondansetron [facility]) tablet 4 mg  4 mg Oral Q6H PRN Karmen Bongo, MD       Or  . ondansetron [facility]) injection 4 mg  4 mg Intravenous Q6H PRN Karmen Bongo, MD      . sacubitril-valsartan (ENTRESTO) 97-103 mg per tablet  1 tablet Oral BID Karmen Bongo, MD       Current Outpatient Medications  Medication Sig Dispense Refill  . acetaminophen (TYLENOL) 325 MG tablet Take 650 mg by mouth every 6 (six) hours as needed for mild pain.     Marland Kitchen albuterol (PROVENTIL) (2.5 MG/3ML) 0.083% nebulizer solution Inhale 3 mLs into the lungs every 6 (six) hours as needed for wheezing or shortness of breath.    Marland Kitchen aspirin 81 MG chewable tablet Chew 1 tablet (81 mg total) by mouth daily. 30 tablet 6  . atorvastatin (LIPITOR) 40 MG tablet TAKE 1 TABLET BY MOUTH ONCE DAILY AT 6PM (Patient taking differently: Take 40 mg by mouth daily. ) 90 tablet 3  . carvedilol (COREG) 6.25 MG tablet Take 1.5 tablets (9.375 mg total) by mouth 2 (two) times daily with a meal for 30 days. 270 tablet 3  . ferrous sulfate 325 (65 FE) MG tablet Take 325 mg  by mouth daily with breakfast.    . furosemide (LASIX) 40 MG tablet Take 2 tablets (80 mg total) by mouth 2 (two) times daily. 120 tablet 3  . ipratropium (ATROVENT) 0.02 % nebulizer solution Take 2.5 mLs (0.5 mg total) by nebulization 2 (two) times daily. 75 mL 12  . omeprazole (PRILOSEC) 40 MG capsule Take 40 mg by mouth daily as needed (gerd).     . potassium chloride (KLOR-CON) 10 MEQ tablet Take 1 tablet (10 mEq total) by mouth daily. 30 tablet 3  . sacubitril-valsartan (ENTRESTO) 97-103 MG Take 1 tablet by mouth 2 (two) times daily. 60 tablet 6  . spironolactone (ALDACTONE) 25 MG tablet Take 1 tablet (25 mg total) by mouth daily. [place]  tablet 6  . SYMBICORT 160-4.5 MCG/ACT inhaler Inhale 1 puff into the lungs in the morning and at bedtime.      Allergies as of 11/06/2019 - Review Complete 11/06/2019  Allergen Reaction Noted  . Penicillins  02/10/2016    Family History  Problem Relation Age of Onset  . Hypertension Mother   . Hypertension Sister     Social History   Socioeconomic History  . Marital status: Single    Spouse name: Not on file  . Number of children: Not on file  . Years of education: Not on file  . Highest education level: Not on file  Occupational History  . Occupation: retired  Tobacco Use  . Smoking status: Former Smoker    Packs/day: 0.50    Years: 44.00    Pack years: 22.00    Types: Cigarettes    Quit date: 06/11/1998    Years since quitting: 21.4  . Smokeless tobacco: Never Used  Substance and Sexual Activity  . Alcohol use: Not Currently    Comment: h/o heavy use  . Drug use: Not Currently  . Sexual activity: Not on file  Other Topics Concern  . Not on file  Social History Narrative  . Not on file   Social Determinants of Health   Financial Resource Strain:   . Difficulty of Paying Living Expenses:   Food Insecurity:   . Worried About Programme researcher, broadcasting/film/video in the Last Year:   . Barista in the Last Year:   Transportation Needs:   .  Freight forwarder (Medical):   Marland Kitchen Lack of Transportation (Non-Medical):   Physical Activity:   . Days of Exercise per Week:   . Minutes of Exercise per Session:   Stress:   . Feeling of Stress :   Social Connections:   . Frequency of Communication with Friends and Family:   . Frequency of Social Gatherings with Friends and Family:   . Attends Religious Services:   . Active Member of Clubs or Organizations:   . Attends Banker Meetings:   Marland Kitchen Marital Status:   Intimate Partner Violence:   . Fear of Current or Ex-Partner:   . Emotionally Abused:   Marland Kitchen Physically Abused:   . Sexually Abused:     Review of Systems: As per HPI, all others negative.  Physical Exam: Vital signs in last 24 hours: Temp:  [97.8 F (36.6 C)-98.2 F (36.8 C)] 98.2 F (36.8 C) (05/28 1220) Pulse Rate:  [73-104] 76 (05/28 1215) Resp:  [18-26] 26 (05/28 1215) BP: (100-125)/(48-63) 111/54 (05/28 1220) SpO2:  [93 %-100 %] 99 % (05/28 1215) Weight:  [104.3 kg] 104.3 kg (05/28 1008)   General:   Alert,  Overweight, Well-developed, well-nourished, pleasant and cooperative in NAD Head:  Normocephalic and atraumatic. Eyes:  Sclera clear, no icterus.   Conjunctiva pink. Ears:  Normal auditory acuity. Nose:  No deformity, discharge,  or lesions. Mouth:  No deformity or lesions.  Oropharynx pink & moist. Neck:  Supple; no masses or thyromegaly. Lungs:  Clear throughout to auscultation.   No wheezes, crackles, or rhonchi. No acute distress. Heart:  Regular rate and rhythm; no murmurs, clicks, rubs,  or gallops. Abdomen:  Soft, protuberant, nontender and nondistended. No masses, hepatosplenomegaly or hernias noted. Normal bowel sounds, without guarding, and without rebound.     Msk:  Symmetrical without gross deformities. Normal posture. Pulses:  Normal pulses noted. Extremities:  Without clubbing or edema. Neurologic:  Alert  and  oriented x4;  grossly normal neurologically. Skin:  Intact without  significant lesions or rashes. Cervical Nodes:  No significant cervical adenopathy. Psych:  Alert and cooperative. Normal mood and affect.   Lab Results: Recent Labs    11/06/19 0742  WBC 11.1*  HGB 7.2*  HCT 26.8*  PLT 758*   BMET Recent Labs    11/06/19 0742  NA 137  K 4.7  CL 97*  CO2 28  GLUCOSE 119*  BUN 22  CREATININE 1.47*  CALCIUM 9.6   LFT Recent Labs    11/06/19 0742  PROT 7.6  ALBUMIN 4.1  AST 14*  ALT 9  ALKPHOS 83  BILITOT 0.3   PT/INR No results for input(s): LABPROT, INR in the last 72 hours.  Studies/Results: No results found.  Impression:  1.  Anemia, microcytic. 2.  History gastric ulcers.  Plan:  1.  Transfuse PRBCs. 2.  PPI. 3.  Clear liquid diet, NPO after midnight. 4.  Endoscopy tomorrow. 5.  Risks (bleeding, infection, bowel perforation that could require surgery, sedation-related changes in cardiopulmonary systems), benefits (identification and possible treatment of source of symptoms, exclusion of certain causes of symptoms), and alternatives (watchful waiting, radiographic imaging studies, empiric medical treatment) of upper endoscopy (EGD) were explained to patient/family in detail and patient wishes to proceed. 6.  If endoscopy unrevealing, consider colonoscopy.   LOS: 0 days   Patsy Zaragoza M  11/06/2019, 2:36 PM  Cell [phone] If no answer or after 5 PM call [phone]

## 2019-11-06 NOTE — Anesthesia Preprocedure Evaluation (Addendum)
Anesthesia Evaluation  Patient identified by MRN, date of birth, ID band Patient awake    Reviewed: Allergy & Precautions, NPO status , Patient's Chart, lab work & pertinent test results  History of Anesthesia Complications Negative for: history of anesthetic complications  Airway Mallampati: II  TM Distance: >3 FB Neck ROM: Full    Dental  (+) Dental Advisory Given, Missing, Poor Dentition, Edentulous Upper,    Pulmonary asthma , sleep apnea (central and obstructive) and Continuous Positive Airway Pressure Ventilation , COPD,  COPD inhaler, former smoker,    Pulmonary exam normal        Cardiovascular hypertension, Pt. on medications and Pt. on home beta blockers + CAD and +CHF  Normal cardiovascular exam+ dysrhythmias    '20 TTE - EF 25-30%. The cavity size was mildly dilated. There is moderate concentric left ventricular hypertrophy.  There is abnormal septal motion consistent with left bundle branch block. Left ventrical global hypokinesis without regional wall motion abnormalities. The right ventricle has mildly reduced systolic function. The cavity  was mildly enlarged. Left atrial size was mildly dilated.  Right atrial size was mildly dilated. Trivial pericardial effusion is present. Trivial AI.  '20 Cath - Prox LAD lesion is 30% stenosed. Prox RCA to Mid RCA lesion is 30% stenosed. Prox Cx lesion is 30% stenosed.    Neuro/Psych negative neurological ROS  negative psych ROS   GI/Hepatic Neg liver ROS, PUD,   Endo/Other   Obesity   Renal/GU Renal InsufficiencyRenal disease     Musculoskeletal negative musculoskeletal ROS (+)   Abdominal   Peds  Hematology  (+) anemia ,  Plt 758k    Anesthesia Other Findings Covid neg 5/28  Reproductive/Obstetrics                            Anesthesia Physical Anesthesia Plan  ASA: IV  Anesthesia Plan: MAC   Post-op Pain Management:     Induction: Intravenous  PONV Risk Score and Plan: 1 and Propofol infusion and Treatment may vary due to age or medical condition  Airway Management Planned: Nasal Cannula and Natural Airway  Additional Equipment: None  Intra-op Plan:   Post-operative Plan:   Informed Consent: I have reviewed the patients History and Physical, chart, labs and discussed the procedure including the risks, benefits and alternatives for the proposed anesthesia with the patient or authorized representative who has indicated his/her understanding and acceptance.       Plan Discussed with: CRNA and Anesthesiologist  Anesthesia Plan Comments:        Anesthesia Quick Evaluation

## 2019-11-06 NOTE — ED Notes (Signed)
Sister , Eber Jones would like an update [phone]

## 2019-11-06 NOTE — ED Triage Notes (Signed)
Pt reports he was seen by pcp yesterday and called today stating his hemoglobin was 6. Pt reports he has been feeling a little more fatigued and tired when walking. Pt a/ox4.

## 2019-11-06 NOTE — ED Notes (Signed)
Call daughter Serita [place] at [phone] with an update

## 2019-11-07 ENCOUNTER — Encounter (HOSPITAL_COMMUNITY)
Admission: EM | Disposition: A | Discharge status: Home / Self Care | Payer: Self-pay | Source: Home / Self Care | Attending: Family Medicine

## 2019-11-07 ENCOUNTER — Inpatient Hospital Stay (HOSPITAL_COMMUNITY): Payer: Medicare Other | Admitting: Anesthesiology

## 2019-11-07 ENCOUNTER — Other Ambulatory Visit: Payer: Self-pay

## 2019-11-07 HISTORY — PX: BIOPSY: SHX5522

## 2019-11-07 HISTORY — PX: ESOPHAGOGASTRODUODENOSCOPY (EGD) WITH PROPOFOL: SHX5813

## 2019-11-07 LAB — CBC WITH DIFFERENTIAL/PLATELET
Abs Immature Granulocytes: 0.04 10*3/uL (ref 0.00–0.07)
Basophils Absolute: 0.1 10*3/uL (ref 0.0–0.1)
Basophils Relative: 1 %
Eosinophils Absolute: 0.1 10*3/uL (ref 0.0–0.5)
Eosinophils Relative: 1 %
HCT: 29.1 % — ABNORMAL LOW (ref 39.0–52.0)
Hemoglobin: 8 g/dL — ABNORMAL LOW (ref 13.0–17.0)
Immature Granulocytes: 0 %
Lymphocytes Relative: 21 %
Lymphs Abs: 2.4 10*3/uL (ref 0.7–4.0)
MCH: 20.4 pg — ABNORMAL LOW (ref 26.0–34.0)
MCHC: 27.5 g/dL — ABNORMAL LOW (ref 30.0–36.0)
MCV: 74.2 fL — ABNORMAL LOW (ref 80.0–100.0)
Monocytes Absolute: 1.2 10*3/uL — ABNORMAL HIGH (ref 0.1–1.0)
Monocytes Relative: 10 %
Neutro Abs: 7.8 10*3/uL — ABNORMAL HIGH (ref 1.7–7.7)
Neutrophils Relative %: 67 %
Platelets: 615 10*3/uL — ABNORMAL HIGH (ref 150–400)
RBC: 3.92 MIL/uL — ABNORMAL LOW (ref 4.22–5.81)
RDW: 17.7 % — ABNORMAL HIGH (ref 11.5–15.5)
WBC: 11.6 10*3/uL — ABNORMAL HIGH (ref 4.0–10.5)
nRBC: 0.2 % (ref 0.0–0.2)

## 2019-11-07 LAB — TYPE AND SCREEN
ABO/RH(D): B POS
Antibody Screen: NEGATIVE
Unit division: 0

## 2019-11-07 LAB — CBC
HCT: 28.7 % — ABNORMAL LOW (ref 39.0–52.0)
Hemoglobin: 7.8 g/dL — ABNORMAL LOW (ref 13.0–17.0)
MCH: 19.9 pg — ABNORMAL LOW (ref 26.0–34.0)
MCHC: 27.2 g/dL — ABNORMAL LOW (ref 30.0–36.0)
MCV: 73.2 fL — ABNORMAL LOW (ref 80.0–100.0)
Platelets: 637 10*3/uL — ABNORMAL HIGH (ref 150–400)
RBC: 3.92 MIL/uL — ABNORMAL LOW (ref 4.22–5.81)
RDW: 17.5 % — ABNORMAL HIGH (ref 11.5–15.5)
WBC: 11.4 10*3/uL — ABNORMAL HIGH (ref 4.0–10.5)
nRBC: 0 % (ref 0.0–0.2)

## 2019-11-07 LAB — BASIC METABOLIC PANEL
Anion gap: 8 (ref 5–15)
BUN: 13 mg/dL (ref 8–23)
CO2: 29 mmol/L (ref 22–32)
Calcium: 9.2 mg/dL (ref 8.9–10.3)
Chloride: 100 mmol/L (ref 98–111)
Creatinine, Ser: 1.16 mg/dL (ref 0.61–1.24)
GFR calc Af Amer: >60 mL/min (ref >60)
GFR calc non Af Amer: >60 mL/min (ref >60)
Glucose, Bld: 101 mg/dL — ABNORMAL HIGH (ref 70–99)
Potassium: 4.3 mmol/L (ref 3.5–5.1)
Sodium: 137 mmol/L (ref 135–145)

## 2019-11-07 LAB — BPAM RBC
Blood Product Expiration Date: 202106232359
ISSUE DATE / TIME: 202105281155
Unit Type and Rh: 7300

## 2019-11-07 SURGERY — ESOPHAGOGASTRODUODENOSCOPY (EGD) WITH PROPOFOL
Anesthesia: Monitor Anesthesia Care | Laterality: Left

## 2019-11-07 MED ORDER — PROPOFOL 500 MG/50ML IV EMUL
INTRAVENOUS | Status: DC | PRN
  Administered 2019-11-07: 50 ug/kg/min via INTRAVENOUS

## 2019-11-07 MED ORDER — SODIUM CHLORIDE 0.9 % IV SOLN: INTRAVENOUS | Status: DC

## 2019-11-07 MED ORDER — PANTOPRAZOLE SODIUM 40 MG PO TBEC
40.0000 mg | DELAYED_RELEASE_TABLET | Freq: Every day | ORAL | Status: DC
  Administered 2019-11-07 – 2019-11-08 (×2): 40 mg via ORAL
  Filled 2019-11-07 (×2): qty 1

## 2019-11-07 MED ORDER — PHENYLEPHRINE 40 MCG/ML (10ML) SYRINGE FOR IV PUSH (FOR BLOOD PRESSURE SUPPORT)
PREFILLED_SYRINGE | INTRAVENOUS | Status: DC | PRN
  Administered 2019-11-07: 40 ug via INTRAVENOUS
  Administered 2019-11-07: 80 ug via INTRAVENOUS

## 2019-11-07 MED ORDER — IPRATROPIUM BROMIDE 0.02 % IN SOLN
0.5000 mg | Freq: Two times a day (BID) | RESPIRATORY_TRACT | Status: DC
  Administered 2019-11-07 – 2019-11-08 (×2): 0.5 mg via RESPIRATORY_TRACT
  Filled 2019-11-07: qty 2.5

## 2019-11-07 MED ORDER — PEG 3350-KCL-NA BICARB-NACL 420 G PO SOLR
4000.0000 mL | Freq: Once | ORAL | Status: AC
  Administered 2019-11-07: 4000 mL via ORAL
  Filled 2019-11-07: qty 4000

## 2019-11-07 SURGICAL SUPPLY — 15 items

## 2019-11-07 NOTE — Anesthesia Postprocedure Evaluation (Signed)
Anesthesia Post Note  Patient: Kristopher [name]  Procedure(s) Performed: ESOPHAGOGASTRODUODENOSCOPY (EGD) WITH PROPOFOL (Left ) BIOPSY     Patient location during evaluation: PACU Anesthesia Type: MAC Level of consciousness: awake and alert Pain management: pain level controlled Vital Signs Assessment: post-procedure vital signs reviewed and stable Respiratory status: spontaneous breathing, nonlabored ventilation and respiratory function stable Cardiovascular status: stable and blood pressure returned to baseline Anesthetic complications: no    Last Vitals:  Vitals:   11/07/19 1100 11/07/19 1130  BP: (!) 121/55 (!) 145/78  Pulse: 69 74  Resp: (!) 28 19  Temp:    SpO2: 97% 98%    Last Pain:  Vitals:   11/07/19 1050  TempSrc: Axillary  PainSc: 0-No pain                 Audry Pili

## 2019-11-07 NOTE — Brief Op Note (Addendum)
11/06/2019 - 11/07/2019  10:51 AM  PATIENT:  Kristopher [name]  69 y.o. male  PRE-OPERATIVE DIAGNOSIS:  anemia, history gastric ulcer  POST-OPERATIVE DIAGNOSIS:  biopsy stomach, esoph. gastritis. no blled  PROCEDURE:  Procedure(s): ESOPHAGOGASTRODUODENOSCOPY (EGD) WITH PROPOFOL (Left) BIOPSY  SURGEON:  Surgeon(s) and Role:    * Davyn Elsasser, MD - Primary  Findings ------------- -EGD showed irregular Z-line and gastric erosion.  No active bleeding  Recommendations --------------------------- -Recommend colonoscopy for further evaluation of anemia. Patient is declining colonoscopy. Discussed with daughter over the phone. According to daughter, he has been declining colonoscopy for last 3 years and he does not want to have it done. They will have a family discussion today and to let the nurse know if he decides to proceed with colonoscopy.  -For now I will keep him on clear liquid diet and if he decides not to pursue colonoscopy then he can have a cardiac diet.  -Monitor H&H -Change Protonix to PO once a day -GI will follow  [name] MD, FACP 11/07/2019, 10:52 AM  Contact #  [phone]

## 2019-11-07 NOTE — Transfer of Care (Signed)
Immediate Anesthesia Transfer of Care Note  Patient: Kristopher [name]  Procedure(s) Performed: ESOPHAGOGASTRODUODENOSCOPY (EGD) WITH PROPOFOL (Left ) BIOPSY  Patient Location: PACU  Anesthesia Type:MAC  Level of Consciousness: awake, alert , oriented and patient cooperative  Airway & Oxygen Therapy: Patient Spontanous Breathing  Post-op Assessment: Report given to RN and Post -op Vital signs reviewed and stable  Post vital signs: Reviewed and stable  Last Vitals:  Vitals Value Taken Time  BP 100/48 11/07/19 1050  Temp 36.7 C 11/07/19 1050  Pulse 71 11/07/19 1056  Resp 19 11/07/19 1056  SpO2 98 % 11/07/19 1056  Vitals shown include unvalidated device data.  Last Pain:  Vitals:   11/07/19 1050  TempSrc: Axillary  PainSc: 0-No pain         Complications: No apparent anesthesia complications

## 2019-11-07 NOTE — Op Note (Signed)
[facility] Patient Name: Kristopher [name] Procedure Date : 11/07/2019 MRN: [id number] Attending MD: [name] , MD Date of Birth: 1950-11-04 CSN: [id number] Age: 69 Admit Type: Inpatient Procedure:                Upper GI endoscopy Indications:              Iron deficiency anemia, Follow-up of gastric ulcer Providers:                [name], MD, Vicki Mallet, RN, Kandice Robinsons, Technician Referring MD:              Medicines:                Sedation Administered by an Anesthesia Professional Complications:            No immediate complications. Estimated Blood Loss:     Estimated blood loss was minimal. Procedure:                Pre-Anesthesia Assessment:                           - Prior to the procedure, a History and Physical                            was performed, and patient medications and                            allergies were reviewed. The patient's tolerance of                            previous anesthesia was also reviewed. The risks                            and benefits of the procedure and the sedation                            options and risks were discussed with the patient.                            All questions were answered, and informed consent                            was obtained. Prior Anticoagulants: The patient has                            taken no previous anticoagulant or antiplatelet                            agents. ASA Grade Assessment: IV - A patient with                            severe systemic disease that is a constant threat  to life. After reviewing the risks and benefits,                            the patient was deemed in satisfactory condition to                            undergo the procedure.                           After obtaining informed consent, the endoscope was                            passed under direct vision. Throughout the                      procedure, the patient's blood pressure, pulse, and                            oxygen saturations were monitored continuously. The                            GIF-H190 ([id number]) Olympus gastroscope was                            introduced through the mouth, and advanced to the                            second part of duodenum. The upper GI endoscopy was                            accomplished without difficulty. The patient                            tolerated the procedure well. Findings:      The Z-line was irregular and was found 35 cm from the incisors. Biopsies       were taken with a cold forceps for histology.      A medium-sized hiatal hernia was present.      A single localized erosion with no bleeding and no stigmata of recent       bleeding was found in the prepyloric region of the stomach. ( most       likely prior ulcer site) . Biopsies were taken with a cold forceps for       histology.      The cardia and gastric fundus were normal on retroflexion.      The duodenal bulb, first portion of the duodenum and second portion of       the duodenum were normal. Impression:               - Z-line irregular, 35 cm from the incisors.                            Biopsied.                           - Medium-sized hiatal hernia.                           -  Erosive gastropathy with no bleeding and no                            stigmata of recent bleeding. Biopsied.                           - Normal duodenal bulb, first portion of the                            duodenum and second portion of the duodenum. Recommendation:           - Perform a colonoscopy tomorrow. Procedure Code(s):        --- Professional ---                           [id number], Esophagogastroduodenoscopy, flexible,                            transoral; with biopsy, single or multiple Diagnosis Code(s):        --- Professional ---                           K22.8, Other specified diseases of esophagus                            K44.9, Diaphragmatic hernia without obstruction or                            gangrene                           K31.89, Other diseases of stomach and duodenum                           D50.9, Iron deficiency anemia, unspecified                           K25.9, Gastric ulcer, unspecified as acute or                            chronic, without hemorrhage or perforation CPT copyright 2019 American Medical Association. All rights reserved. The codes documented in this report are preliminary and upon coder review may  be revised to meet current compliance requirements. Otis Brace, MD Otis Brace, MD 11/07/2019 10:50:43 AM Number of Addenda: 0

## 2019-11-07 NOTE — Progress Notes (Signed)
Patient does not want to wear his CPAP here. He has one at home that has been broken so he hasn't worn it in a while. He stated that he does fine without it. He will let us know if he changes his mind.

## 2019-11-07 NOTE — Interval H&P Note (Signed)
History and Physical Interval Note:  11/07/2019 10:16 AM  Kristopher [name]  has presented today for surgery, with the diagnosis of anemia, history gastric ulcer.  The various methods of treatment have been discussed with the patient and family. After consideration of risks, benefits and other options for treatment, the patient has consented to  Procedure(s): ESOPHAGOGASTRODUODENOSCOPY (EGD) WITH PROPOFOL (Left) as a surgical intervention.  The patient's history has been reviewed, patient examined, no change in status, stable for surgery.  I have reviewed the patient's chart and labs.  Questions were answered to the patient's satisfaction.    Patient seen and examined at bedside in the endoscopy unit. Denies any acute GI issues. History of esophagitis and gastric ulcer in 2018. Follow-up EGD was recommended but patient did not follow through. Not sure if he was taking PPI as an outpatient.  Risks (bleeding, infection, bowel perforation that could require surgery, sedation-related changes in cardiopulmonary systems), benefits (identification and possible treatment of source of symptoms, exclusion of certain causes of symptoms), and alternatives (watchful waiting, radiographic imaging studies, empiric medical treatment)  were explained to patient/family in detail and patient wishes to proceed.    Kristopher [name]

## 2019-11-07 NOTE — Progress Notes (Signed)
PROGRESS NOTE    Kristopher [name]  DJM:[id number] DOB: June 23, 1950 DOA: 11/06/2019 PCP: Shirlean Mylar, MD   Brief Narrative:  Kristopher [name] is a 69 y.o. male with medical history significant of  Pulmonary HTN; OSA; HTN; chronic combined CHF; and h/o GI bleed in 2018 presenting with fatigue.  Symptoms started a couple of weeks ago and seemed to wax and wane and seemed to progress.  He went for a wellness check to his PCP and had blood work and they called back and said his Hgb was 6 and to come to the hospital.  He has chronic SOB due to CHF since 07/2018.  He had an UGI bleed in 2019.  He has never had a colonoscopy.   Assessment & Plan:   Principal Problem:   Symptomatic anemia Active Problems:   Acute upper GI bleed   Chronic combined systolic and diastolic heart failure (HCC)   HTN (hypertension)   OSA (obstructive sleep apnea)   Morbid obesity (HCC)   Thrombocytosis (HCC)   COPD (chronic obstructive pulmonary disease) (HCC)   Dyslipidemia   Symptomatic anemia/GI bleeding, likely lower -Patient with prior h/o remote UGI bleeding, now presenting with fatigue and apparent symptomatic anemia. Hgb 7.2. Received 1 unit of PRBC transfusion. Posttransfusion hemoglobin 7.8. GI consulted. Underwent EGD which did not show any source of bleeding. Monitor H&H every 12 hours with IV PPI every 12 hours. Patient was very much against any sort of colonoscopy when I discussed with him this morning however he was later convinced to have 1. Patient is scheduled for colonoscopy in the morning.  AKI: Resolved.  Thrombocytosis: Likely reactive and stable.  Chronic combined CHF -11/13/18 echo with EF 25-30% and indeterminate diastolic function. Currently euvolemic. Holding aspirin Lasix and Aldactone. Continue Entresto.  Essential HTN: Controlled. Continue Coreg, Entresto  HLD -Continue Lipitor  OSA: CPAP  Obesity Body mass index is 36.02 kg/m. -Weight loss should be encouraged -Outpatient  PCP/bariatric medicine/bariatric surgery f/u encouraged  COPD with remote h/o tobacco abuse: Currently controlled and not in any exacerbation. -Continue Albuterol and Atrovent -Continue Symbicort  DVT prophylaxis: SCD   Code Status: Full Code  Family Communication: None present at bedside.  Plan of care discussed with patient in length and he verbalized understanding and agreed with it. Patient is from: Home Disposition Plan: Home likely tomorrow Barriers to discharge: Pending colonoscopy tomorrow  Status is: Inpatient  Remains inpatient appropriate because:Ongoing diagnostic testing needed not appropriate for outpatient work up   Dispo: The patient is from: Home              Anticipated d/c is to: Home              Anticipated d/c date is: 1 day              Patient currently is not medically stable to d/c.         Estimated body mass index is 36.02 kg/m as calculated from the following:   Height as of this encounter: 5\' 7"  (1.702 m).   Weight as of this encounter: 104.3 kg.      Nutritional status:               Consultants:   GI  Procedures:   EGD  Antimicrobials:  Anti-infectives (From admission, onward)   None         Subjective: Seen and examined this morning before EGD. He had no complaint. Did not want colonoscopy.  Objective: Vitals:  11/07/19 1013 11/07/19 1050 11/07/19 1100 11/07/19 1130  BP: (!) 141/61 (!) 100/48 (!) 121/55 (!) 145/78  Pulse: 73 78 69 74  Resp: (!) 26 (!) 24 (!) 28 19  Temp: 98.5 F (36.9 C) 98 F (36.7 C)    TempSrc: Oral Axillary    SpO2: 96% 94% 97% 98%  Weight:      Height:        Intake/Output Summary (Last 24 hours) at 11/07/2019 1504 Last data filed at 11/07/2019 0511 Gross per 24 hour  Intake 1265.35 ml  Output 520 ml  Net 745.35 ml   Filed Weights   11/06/19 1008  Weight: 104.3 kg    Examination:  General exam: Appears calm and comfortable  Respiratory system: Clear to  auscultation. Respiratory effort normal. Cardiovascular system: S1 & S2 heard, RRR. No JVD, murmurs, rubs, gallops or clicks. No pedal edema. Gastrointestinal system: Abdomen is nondistended, soft and nontender. No organomegaly or masses felt. Normal bowel sounds heard. Central nervous system: Alert and oriented. No focal neurological deficits. Extremities: Symmetric 5 x 5 power. Skin: No rashes, lesions or ulcers Psychiatry: Judgement and insight appear normal. Mood & affect appropriate.    Data Reviewed: I have personally reviewed following labs and imaging studies  CBC: Recent Labs  Lab 11/06/19 0742 11/06/19 1647 11/07/19 0821  WBC 11.1* 12.4* 11.4*  NEUTROABS 8.1*  --   --   HGB 7.2* 8.1* 7.8*  HCT 26.8* 30.2* 28.7*  MCV 71.8* 74.0* 73.2*  PLT 758* 670* 637*   Basic Metabolic Panel: Recent Labs  Lab 11/06/19 0742 11/07/19 0821  NA 137 137  K 4.7 4.3  CL 97* 100  CO2 28 29  GLUCOSE 119* 101*  BUN 22 13  CREATININE 1.47* 1.16  CALCIUM 9.6 9.2   GFR: Estimated Creatinine Clearance: 70.2 mL/min (by C-G formula based on SCr of 1.16 mg/dL). Liver Function Tests: Recent Labs  Lab 11/06/19 0742  AST 14*  ALT 9  ALKPHOS 83  BILITOT 0.3  PROT 7.6  ALBUMIN 4.1   No results for input(s): LIPASE, AMYLASE in the last 168 hours. No results for input(s): AMMONIA in the last 168 hours. Coagulation Profile: No results for input(s): INR, PROTIME in the last 168 hours. Cardiac Enzymes: No results for input(s): CKTOTAL, CKMB, CKMBINDEX, TROPONINI in the last 168 hours. BNP (last 3 results) No results for input(s): PROBNP in the last 8760 hours. HbA1C: No results for input(s): HGBA1C in the last 72 hours. CBG: No results for input(s): GLUCAP in the last 168 hours. Lipid Profile: No results for input(s): CHOL, HDL, LDLCALC, TRIG, CHOLHDL, LDLDIRECT in the last 72 hours. Thyroid Function Tests: No results for input(s): TSH, T4TOTAL, FREET4, T3FREE, THYROIDAB in the last  72 hours. Anemia Panel: No results for input(s): VITAMINB12, FOLATE, FERRITIN, TIBC, IRON, RETICCTPCT in the last 72 hours. Sepsis Labs: No results for input(s): PROCALCITON, LATICACIDVEN in the last 168 hours.  Recent Results (from the past 240 hour(s))  SARS Coronavirus 2 by RT PCR (hospital order, performed in [facility] hospital lab) Nasopharyngeal Nasopharyngeal Swab     Status: None   Collection Time: 11/06/19  9:58 AM   Specimen: Nasopharyngeal Swab  Result Value Ref Range Status   SARS Coronavirus 2 NEGATIVE NEGATIVE Final    Comment: (NOTE) SARS-CoV-2 target nucleic acids are NOT DETECTED. The SARS-CoV-2 RNA is generally detectable in upper and lower respiratory specimens during the acute phase of infection. The lowest concentration of SARS-CoV-2 viral copies this assay  can detect is 250 copies / mL. A negative result does not preclude SARS-CoV-2 infection and should not be used as the sole basis for treatment or other patient management decisions.  A negative result may occur with improper specimen collection / handling, submission of specimen other than nasopharyngeal swab, presence of viral mutation(s) within the areas targeted by this assay, and inadequate number of viral copies (<250 copies / mL). A negative result must be combined with clinical observations, patient history, and epidemiological information. Fact Sheet for Patients:   StrictlyIdeas.no Fact Sheet for Healthcare Providers: BankingDealers.co.za This test is not yet approved or cleared  by the [place] FDA and has been authorized for detection and/or diagnosis of SARS-CoV-2 by FDA under an Emergency Use Authorization (EUA).  This EUA will remain in effect (meaning this test can be used) for the duration of the COVID-19 declaration under Section 564(b)(1) of the Act, 21 U.S.C. section 360bbb-3(b)(1), unless the authorization is terminated or revoked  sooner. Performed at [facility], [place] [address], [place], [place] [address]       Radiology Studies: No results found.  Scheduled Meds: . atorvastatin  40 mg Oral Daily  . carvedilol  9.375 mg Oral BID WC  . ipratropium  0.5 mg Nebulization BID  . mometasone-formoterol  2 puff Inhalation BID  . pantoprazole  40 mg Oral Daily  . sacubitril-valsartan  1 tablet Oral BID   Continuous Infusions: . sodium chloride    . lactated ringers 75 mL/hr at 11/07/19 0511     LOS: 1 day   Time spent: 35 minutes   Darliss Cheney, MD [facility]  11/07/2019, 3:04 PM   To contact the attending provider between 7A-7P or the covering provider during after hours 7P-7A, please log into the web site [email or web address].

## 2019-11-08 ENCOUNTER — Encounter (HOSPITAL_COMMUNITY): Payer: Self-pay | Admitting: Internal Medicine

## 2019-11-08 ENCOUNTER — Inpatient Hospital Stay (HOSPITAL_COMMUNITY): Payer: Medicare Other | Admitting: Anesthesiology

## 2019-11-08 ENCOUNTER — Encounter (HOSPITAL_COMMUNITY)
Admission: EM | Disposition: A | Discharge status: Home / Self Care | Payer: Self-pay | Source: Home / Self Care | Attending: Family Medicine

## 2019-11-08 HISTORY — PX: BIOPSY: SHX5522

## 2019-11-08 HISTORY — PX: COLONOSCOPY WITH PROPOFOL: SHX5780

## 2019-11-08 HISTORY — PX: POLYPECTOMY: SHX5525

## 2019-11-08 LAB — CBC WITH DIFFERENTIAL/PLATELET
Abs Immature Granulocytes: 0.04 10*3/uL (ref 0.00–0.07)
Basophils Absolute: 0.1 10*3/uL (ref 0.0–0.1)
Basophils Relative: 1 %
Eosinophils Absolute: 0.2 10*3/uL (ref 0.0–0.5)
Eosinophils Relative: 2 %
HCT: 29.2 % — ABNORMAL LOW (ref 39.0–52.0)
Hemoglobin: 7.9 g/dL — ABNORMAL LOW (ref 13.0–17.0)
Immature Granulocytes: 0 %
Lymphocytes Relative: 18 %
Lymphs Abs: 1.8 10*3/uL (ref 0.7–4.0)
MCH: 19.9 pg — ABNORMAL LOW (ref 26.0–34.0)
MCHC: 27.1 g/dL — ABNORMAL LOW (ref 30.0–36.0)
MCV: 73.6 fL — ABNORMAL LOW (ref 80.0–100.0)
Monocytes Absolute: 1 10*3/uL (ref 0.1–1.0)
Monocytes Relative: 10 %
Neutro Abs: 7 10*3/uL (ref 1.7–7.7)
Neutrophils Relative %: 69 %
Platelets: 595 10*3/uL — ABNORMAL HIGH (ref 150–400)
RBC: 3.97 MIL/uL — ABNORMAL LOW (ref 4.22–5.81)
RDW: 17.5 % — ABNORMAL HIGH (ref 11.5–15.5)
WBC: 10.2 10*3/uL (ref 4.0–10.5)
nRBC: 0 % (ref 0.0–0.2)

## 2019-11-08 LAB — BASIC METABOLIC PANEL
Anion gap: 9 (ref 5–15)
BUN: 8 mg/dL (ref 8–23)
CO2: 26 mmol/L (ref 22–32)
Calcium: 9.1 mg/dL (ref 8.9–10.3)
Chloride: 100 mmol/L (ref 98–111)
Creatinine, Ser: 1.06 mg/dL (ref 0.61–1.24)
GFR calc Af Amer: >60 mL/min (ref >60)
GFR calc non Af Amer: >60 mL/min (ref >60)
Glucose, Bld: 108 mg/dL — ABNORMAL HIGH (ref 70–99)
Potassium: 4.3 mmol/L (ref 3.5–5.1)
Sodium: 135 mmol/L (ref 135–145)

## 2019-11-08 LAB — MAGNESIUM: Magnesium: 2.2 mg/dL (ref 1.7–2.4)

## 2019-11-08 SURGERY — COLONOSCOPY WITH PROPOFOL
Anesthesia: Monitor Anesthesia Care

## 2019-11-08 MED ORDER — PHENYLEPHRINE HCL (PRESSORS) 10 MG/ML IV SOLN
INTRAVENOUS | Status: DC | PRN
  Administered 2019-11-08 (×2): 120 ug via INTRAVENOUS
  Administered 2019-11-08: 80 ug via INTRAVENOUS
  Administered 2019-11-08: 120 ug via INTRAVENOUS
  Administered 2019-11-08: 200 ug via INTRAVENOUS
  Administered 2019-11-08: 80 ug via INTRAVENOUS

## 2019-11-08 MED ORDER — LIDOCAINE HCL (CARDIAC) PF 100 MG/5ML IV SOSY
PREFILLED_SYRINGE | INTRAVENOUS | Status: DC | PRN
  Administered 2019-11-08: 60 mg via INTRATRACHEAL

## 2019-11-08 MED ORDER — PROPOFOL 500 MG/50ML IV EMUL
INTRAVENOUS | Status: DC | PRN
  Administered 2019-11-08: 75 ug/kg/min via INTRAVENOUS

## 2019-11-08 MED ORDER — LACTATED RINGERS IV SOLN: INTRAVENOUS | Status: DC

## 2019-11-08 SURGICAL SUPPLY — 22 items

## 2019-11-08 NOTE — Evaluation (Addendum)
Physical Therapy Evaluation and Discharge  Patient Details Name: Kristopher [name] MRN: [id number] DOB: 1950/09/15 Today's Date: 11/08/2019   History of Present Illness  Kristopher [name] is a 69 y.o. male with medical history significant of  Pulmonary HTN; OSA; HTN; chronic combined CHF; and h/o GI bleed in 2018 presenting with fatigue.  Symptoms started a couple of weeks ago and seemed to wax and wane and seemed to progress.  He went for a wellness check to his PCP and had blood work and they called back and said his Hgb was 6 and to come to the hospital.   Clinical Impression   Patient evaluated by Physical Therapy with no further acute PT needs identified. All education has been completed and the patient has no further questions. He is ambulating quite well, and ok for dc home from a PT functional mobility standpoint;  See below for any follow-up Physical Therapy or equipment needs. PT is signing off. Thank you for this referral.     Follow Up Recommendations No PT follow up    Equipment Recommendations  None recommended by PT    Recommendations for Other Services       Precautions / Restrictions Precautions Precautions: None      Mobility  Bed Mobility Overal bed mobility: Independent                Transfers Overall transfer level: Independent                  Ambulation/Gait Ambulation/Gait assistance: Supervision;Independent Gait Distance (Feet): 1000 Feet Assistive device: None Gait Pattern/deviations: Step-through pattern     General Gait Details: Initially with supervision and cues to self-monitor fro activity tolerance; progressed to independent  Stairs            Wheelchair Mobility    Modified Rankin (Stroke Patients Only)       Balance Overall balance assessment: No apparent balance deficits (not formally assessed)                                           Pertinent Vitals/Pain Pain Assessment: No/denies pain     Home Living Family/patient expects to be discharged to:: Private residence Living Arrangements: Other relatives Available Help at Discharge: Family Type of Home: House Home Access: Level entry     Home Layout: One level Home Equipment: Environmental consultant - 4 wheels      Prior Function Level of Independence: Independent               Hand Dominance   Dominant Hand: Right    Extremity/Trunk Assessment   Upper Extremity Assessment Upper Extremity Assessment: Overall WFL for tasks assessed    Lower Extremity Assessment Lower Extremity Assessment: Overall WFL for tasks assessed       Communication   Communication: No difficulties  Cognition Arousal/Alertness: Awake/alert Behavior During Therapy: WFL for tasks assessed/performed Overall Cognitive Status: Within Functional Limits for tasks assessed                                        General Comments      Exercises     Assessment/Plan    PT Assessment Patent does not need any further PT services  PT Problem List  PT Treatment Interventions      PT Goals (Current goals can be found in the Care Plan section)  Acute Rehab PT Goals Patient Stated Goal: Hopes to go home soon PT Goal Formulation: All assessment and education complete, DC therapy    Frequency     Barriers to discharge        Co-evaluation               AM-PAC PT "6 Clicks" Mobility  Outcome Measure Help needed turning from your back to your side while in a flat bed without using bedrails?: None Help needed moving from lying on your back to sitting on the side of a flat bed without using bedrails?: None Help needed moving to and from a bed to a chair (including a wheelchair)?: None Help needed standing up from a chair using your arms (e.g., wheelchair or bedside chair)?: None Help needed to walk in hospital room?: None Help needed climbing 3-5 steps with a railing? : None 6 Click Score: 24    End of Session    Activity Tolerance: Patient tolerated treatment well Patient left: in chair;with call bell/phone within reach Nurse Communication: Mobility status PT Visit Diagnosis: Other abnormalities of gait and mobility (R26.89)    Time: 6761-9509 PT Time Calculation (min) (ACUTE ONLY): 19 min   Charges:   PT Evaluation $PT Eval Low Complexity: 1 Low          Van Clines, PT  Acute Rehabilitation Services Pager [phone] Office [phone]   [name] 11/08/2019, 4:40 PM

## 2019-11-08 NOTE — Discharge Summary (Signed)
Physician Discharge Summary  Kristopher [name] JYN:[id number] DOB: Sep 03, 1950 DOA: 11/06/2019  PCP: Shirlean Mylar, MD  Admit date: 11/06/2019 Discharge date: 11/08/2019  Admitted From: Home Disposition: Home  Recommendations for Outpatient Follow-up:  1. Follow up with PCP in 1-2 weeks 2. Please avoid all sorts of NSAIDs 3. Please obtain BMP/CBC in one week 4. Please follow up on the following pending results:  Home Health: None Equipment/Devices: None  Discharge Condition: Stable CODE STATUS: Full code Diet recommendation: Cardiac/low-sodium  Subjective: Seen and examined before colonoscopy.  Has no complaint.  Plans to go home after colonoscopy  Brief/Interim Summary: Kristopher Fehnel McLambis a 69 y.o.malewith medical history significant ofPulmonary HTN; OSA; HTN; chronic combined CHF; and h/o GI bleed in 2018 presenting with fatigue.Symptoms started a couple of weeks ago and seemed to wax and wane and seemed to progress. He went for a wellness check to his PCP and had blood work and they called back and said his Hgb was 6 and to come to the hospital. He has chronic SOB due to CHF since 07/2018. He had an UGI bleed in 2019. He has never had a colonoscopy.  Patient was admitted under hospitalist service for acute blood loss anemia and possible GI bleed. Hgb 7.2 at the time of admission. Received 1 unit of PRBC transfusion. Posttransfusion hemoglobin 7.8 which remained stable.  GI was consulted.  He underwent EGD on 11/07/2019 which was unremarkable.  Initially patient was reluctant to have colonoscopy however later on he agreed to have 1 so he stayed overnight and ended up having colonoscopy today on 11/08/2019 which showed internal hemorrhoids and several polyps which were resected and sent for biopsy.  No active source of bleeding.  Patient's hemoglobin remained stable at 7.9.  GI has cleared the patient for discharge with instructions to avoid all sorts of NSAIDs and continue omeprazole 40 mg p.o.  daily which she is taking from home.  He is being discharged in stable condition.  GI recommends repeating colonoscopy in 3 years for surveillance of multiple polyps.  Discharge Diagnoses:  Principal Problem:   Symptomatic anemia Active Problems:   Acute upper GI bleed   Chronic combined systolic and diastolic heart failure (HCC)   HTN (hypertension)   OSA (obstructive sleep apnea)   Morbid obesity (HCC)   Thrombocytosis (HCC)   COPD (chronic obstructive pulmonary disease) (HCC)   Dyslipidemia    Discharge Instructions   Allergies as of 11/08/2019      Reactions   Penicillins    Pt reports it makes him feel shaky Has patient had a PCN reaction causing immediate rash, facial/tongue/throat swelling, SOB or lightheadedness with hypotension: YES Has patient had a PCN reaction causing severe rash involving mucus membranes or skin necrosis: NO Has patient had a PCN reaction that required hospitalization NO Has patient had a PCN reaction occurring within the last 10 years: NO If all of the above answers are "NO", then may proceed with Cephalosporin use.      Medication List    TAKE these medications   acetaminophen 325 MG tablet Commonly known as: TYLENOL Take 650 mg by mouth every 6 (six) hours as needed for mild pain.   albuterol (2.5 MG/3ML) 0.083% nebulizer solution Commonly known as: PROVENTIL Inhale 3 mLs into the lungs every 6 (six) hours as needed for wheezing or shortness of breath.   aspirin 81 MG chewable tablet Chew 1 tablet (81 mg total) by mouth daily.   atorvastatin 40 MG tablet Commonly known  as: LIPITOR TAKE 1 TABLET BY MOUTH ONCE DAILY AT 6PM What changed: See the new instructions.   carvedilol 6.25 MG tablet Commonly known as: COREG Take 1.5 tablets (9.375 mg total) by mouth 2 (two) times daily with a meal for 30 days.   ferrous sulfate 325 (65 FE) MG tablet Take 325 mg by mouth daily with breakfast.   furosemide 40 MG tablet Commonly known as:  LASIX Take 2 tablets (80 mg total) by mouth 2 (two) times daily.   ipratropium 0.02 % nebulizer solution Commonly known as: ATROVENT Take 2.5 mLs (0.5 mg total) by nebulization 2 (two) times daily.   omeprazole 40 MG capsule Commonly known as: PRILOSEC Take 40 mg by mouth daily as needed (gerd).   potassium chloride 10 MEQ tablet Commonly known as: KLOR-CON Take 1 tablet (10 mEq total) by mouth daily.   sacubitril-valsartan 97-103 MG Commonly known as: ENTRESTO Take 1 tablet by mouth 2 (two) times daily.   spironolactone 25 MG tablet Commonly known as: ALDACTONE Take 1 tablet (25 mg total) by mouth daily.   Symbicort 160-4.5 MCG/ACT inhaler Generic drug: budesonide-formoterol Inhale 1 puff into the lungs in the morning and at bedtime.      Follow-up Information    Maurice Small, MD Follow up in 1 week(s).   Specialty: Family Medicine Contact information: [place] [address] [phone]        Sueanne Margarita, MD .   Specialty: Cardiology Contact information: [phone] [address] [address] [phone]        Bensimhon, Shaune Pascal, MD .   Specialty: Cardiology Contact information: [place] [address] [phone]          Allergies  Allergen Reactions  . Penicillins     Pt reports it makes him feel shaky Has patient had a PCN reaction causing immediate rash, facial/tongue/throat swelling, SOB or lightheadedness with hypotension: YES Has patient had a PCN reaction causing severe rash involving mucus membranes or skin necrosis: NO Has patient had a PCN reaction that required hospitalization NO Has patient had a PCN reaction occurring within the last 10 years: NO If all of the above answers are "NO", then may proceed with Cephalosporin use.    Consultations: GI   Procedures/Studies:  No results found.   Discharge Exam: Vitals:   11/08/19 0945 11/08/19 1105   BP: (!) 151/66 (!) 103/47  Pulse: 83 72  Resp: 18 (!) 31  Temp: 98.8 F (37.1 C) 97.6 F (36.4 C)  SpO2: 96% 100%   Vitals:   11/08/19 0830 11/08/19 0927 11/08/19 0945 11/08/19 1105  BP:  131/61 (!) 151/66 (!) 103/47  Pulse:  73 83 72  Resp:  16 18 (!) 31  Temp:   98.8 F (37.1 C) 97.6 F (36.4 C)  TempSrc:  Oral Oral   SpO2: 93% 95% 96% 100%  Weight:      Height:        General: Pt is alert, awake, not in acute distress Cardiovascular: RRR, S1/S2 +, no rubs, no gallops Respiratory: CTA bilaterally, no wheezing, no rhonchi Abdominal: Soft, NT, ND, bowel sounds + Extremities: no edema, no cyanosis    The results of significant diagnostics from this hospitalization (including imaging, microbiology, ancillary and laboratory) are listed below for reference.     Microbiology: Recent Results (from the past 240 hour(s))  SARS Coronavirus 2 by RT PCR (hospital order, performed in  [facility] hospital lab) Nasopharyngeal Nasopharyngeal Swab     Status: None   Collection Time: 11/06/19  9:58 AM   Specimen: Nasopharyngeal Swab  Result Value Ref Range Status   SARS Coronavirus 2 NEGATIVE NEGATIVE Final    Comment: (NOTE) SARS-CoV-2 target nucleic acids are NOT DETECTED. The SARS-CoV-2 RNA is generally detectable in upper and lower respiratory specimens during the acute phase of infection. The lowest concentration of SARS-CoV-2 viral copies this assay can detect is 250 copies / mL. A negative result does not preclude SARS-CoV-2 infection and should not be used as the sole basis for treatment or other patient management decisions.  A negative result may occur with improper specimen collection / handling, submission of specimen other than nasopharyngeal swab, presence of viral mutation(s) within the areas targeted by this assay, and inadequate number of viral copies (<250 copies / mL). A negative result must be combined with clinical observations, patient history, and  epidemiological information. Fact Sheet for Patients:   BoilerBrush.com.cy Fact Sheet for Healthcare Providers: https://pope.com/ This test is not yet approved or cleared  by the [place] FDA and has been authorized for detection and/or diagnosis of SARS-CoV-2 by FDA under an Emergency Use Authorization (EUA).  This EUA will remain in effect (meaning this test can be used) for the duration of the COVID-19 declaration under Section 564(b)(1) of the Act, 21 U.S.C. section 360bbb-3(b)(1), unless the authorization is terminated or revoked sooner. Performed at [facility], [address] [place], [place] [address]      Labs: BNP (last 3 results) Recent Labs    11/13/18 1202 11/06/19 0838  BNP 58.8 37.2   Basic Metabolic Panel: Recent Labs  Lab 11/06/19 0742 11/07/19 0821 11/08/19 0643  NA 137 137 135  K 4.7 4.3 4.3  CL 97* 100 100  CO2 28 29 26   GLUCOSE 119* 101* 108*  BUN 22 13 8   CREATININE 1.47* 1.16 1.06  CALCIUM 9.6 9.2 9.1  MG  --   --  2.2   Liver Function Tests: Recent Labs  Lab 11/06/19 0742  AST 14*  ALT 9  ALKPHOS 83  BILITOT 0.3  PROT 7.6  ALBUMIN 4.1   No results for input(s): LIPASE, AMYLASE in the last 168 hours. No results for input(s): AMMONIA in the last 168 hours. CBC: Recent Labs  Lab 11/06/19 0742 11/06/19 1647 11/07/19 0821 11/07/19 1849 11/08/19 0643  WBC 11.1* 12.4* 11.4* 11.6* 10.2  NEUTROABS 8.1*  --   --  7.8* 7.0  HGB 7.2* 8.1* 7.8* 8.0* 7.9*  HCT 26.8* 30.2* 28.7* 29.1* 29.2*  MCV 71.8* 74.0* 73.2* 74.2* 73.6*  PLT 758* 670* 637* 615* 595*   Cardiac Enzymes: No results for input(s): CKTOTAL, CKMB, CKMBINDEX, TROPONINI in the last 168 hours. BNP: Invalid input(s): POCBNP CBG: No results for input(s): GLUCAP in the last 168 hours. D-Dimer No results for input(s): DDIMER in the last 72 hours. Hgb A1c No results for input(s): HGBA1C in the last 72 hours. Lipid  Profile No results for input(s): CHOL, HDL, LDLCALC, TRIG, CHOLHDL, LDLDIRECT in the last 72 hours. Thyroid function studies No results for input(s): TSH, T4TOTAL, T3FREE, THYROIDAB in the last 72 hours.  Invalid input(s): FREET3 Anemia work up No results for input(s): VITAMINB12, FOLATE, FERRITIN, TIBC, IRON, RETICCTPCT in the last 72 hours. Urinalysis No results found for: COLORURINE, APPEARANCEUR, LABSPEC, PHURINE, GLUCOSEU, HGBUR, BILIRUBINUR, KETONESUR, PROTEINUR, UROBILINOGEN, NITRITE, LEUKOCYTESUR Sepsis Labs Invalid input(s): PROCALCITONIN,  WBC,  LACTICIDVEN Microbiology Recent  Results (from the past 240 hour(s))  SARS Coronavirus 2 by RT PCR (hospital order, performed in [facility] hospital lab) Nasopharyngeal Nasopharyngeal Swab     Status: None   Collection Time: 11/06/19  9:58 AM   Specimen: Nasopharyngeal Swab  Result Value Ref Range Status   SARS Coronavirus 2 NEGATIVE NEGATIVE Final    Comment: (NOTE) SARS-CoV-2 target nucleic acids are NOT DETECTED. The SARS-CoV-2 RNA is generally detectable in upper and lower respiratory specimens during the acute phase of infection. The lowest concentration of SARS-CoV-2 viral copies this assay can detect is 250 copies / mL. A negative result does not preclude SARS-CoV-2 infection and should not be used as the sole basis for treatment or other patient management decisions.  A negative result may occur with improper specimen collection / handling, submission of specimen other than nasopharyngeal swab, presence of viral mutation(s) within the areas targeted by this assay, and inadequate number of viral copies (<250 copies / mL). A negative result must be combined with clinical observations, patient history, and epidemiological information. Fact Sheet for Patients:   BoilerBrush.com.cy Fact Sheet for Healthcare Providers: https://pope.com/ This test is not yet approved or cleared  by  the [place] FDA and has been authorized for detection and/or diagnosis of SARS-CoV-2 by FDA under an Emergency Use Authorization (EUA).  This EUA will remain in effect (meaning this test can be used) for the duration of the COVID-19 declaration under Section 564(b)(1) of the Act, 21 U.S.C. section 360bbb-3(b)(1), unless the authorization is terminated or revoked sooner. Performed at [facility], [address] [place], [place] [address]      Time coordinating discharge: Over 30 minutes  SIGNED:   Hughie Closs, MD  [facility] 11/08/2019, 11:21 AM  If 7PM-7AM, please contact night-coverage [email or web address]

## 2019-11-08 NOTE — Anesthesia Preprocedure Evaluation (Signed)
Anesthesia Evaluation  Patient identified by MRN, date of birth, ID band Patient awake    Reviewed: Allergy & Precautions, NPO status , Patient's Chart, lab work & pertinent test results  History of Anesthesia Complications Negative for: history of anesthetic complications  Airway Mallampati: II  TM Distance: >3 FB Neck ROM: Full    Dental  (+) Dental Advisory Given, Missing, Poor Dentition, Edentulous Upper,    Pulmonary asthma , sleep apnea (central and obstructive) and Continuous Positive Airway Pressure Ventilation , COPD,  COPD inhaler, former smoker,    Pulmonary exam normal        Cardiovascular hypertension, Pt. on medications and Pt. on home beta blockers + CAD and +CHF  Normal cardiovascular exam+ dysrhythmias    '20 TTE - EF 25-30%. The cavity size was mildly dilated. There is moderate concentric left ventricular hypertrophy.  There is abnormal septal motion consistent with left bundle branch block. Left ventrical global hypokinesis without regional wall motion abnormalities. The right ventricle has mildly reduced systolic function. The cavity  was mildly enlarged. Left atrial size was mildly dilated.  Right atrial size was mildly dilated. Trivial pericardial effusion is present. Trivial AI.  '20 Cath - Prox LAD lesion is 30% stenosed. Prox RCA to Mid RCA lesion is 30% stenosed. Prox Cx lesion is 30% stenosed.    Neuro/Psych negative neurological ROS  negative psych ROS   GI/Hepatic Neg liver ROS, PUD,   Endo/Other   Obesity   Renal/GU Renal InsufficiencyRenal disease     Musculoskeletal negative musculoskeletal ROS (+)   Abdominal   Peds  Hematology  (+) anemia ,  Plt 758k    Anesthesia Other Findings Covid neg 5/28  Reproductive/Obstetrics                             Anesthesia Physical  Anesthesia Plan  ASA: IV  Anesthesia Plan: MAC   Post-op Pain Management:     Induction: Intravenous  PONV Risk Score and Plan: 1 and Propofol infusion and Treatment may vary due to age or medical condition  Airway Management Planned: Nasal Cannula and Natural Airway  Additional Equipment: None  Intra-op Plan:   Post-operative Plan:   Informed Consent: I have reviewed the patients History and Physical, chart, labs and discussed the procedure including the risks, benefits and alternatives for the proposed anesthesia with the patient or authorized representative who has indicated his/her understanding and acceptance.     Dental advisory given  Plan Discussed with: CRNA and Anesthesiologist  Anesthesia Plan Comments:         Anesthesia Quick Evaluation

## 2019-11-08 NOTE — Progress Notes (Signed)
[facility] Progress Note  Kristopher [name] 69 y.o. 22-May-1951  CC: Anemia   Subjective: Patient seen and examined at bedside in endoscopy unit.  Completed about three fourths of the prep.  Denies seeing any blood in the stool during prep.  Denies abdominal pain.    Objective: Vital signs in last 24 hours: Vitals:   11/08/19 0830 11/08/19 0927  BP:  131/61  Pulse:  73  Resp:  16  Temp:  98.3 F (36.8 C)  SpO2: 93% 95%    Physical Exam:  General.  Obese patient.  Not in acute distress Heart.  Rate rhythm regular Lungs.  Clear to auscultate with no respiratory distress Abdomen, soft, mild distended, bowel sounds present.  No peritoneal signs  Lab Results: Recent Labs    11/07/19 0821 11/08/19 0643  NA 137 135  K 4.3 4.3  CL 100 100  CO2 29 26  GLUCOSE 101* 108*  BUN 13 8  CREATININE 1.16 1.06  CALCIUM 9.2 9.1  MG  --  2.2   Recent Labs    11/06/19 0742  AST 14*  ALT 9  ALKPHOS 83  BILITOT 0.3  PROT 7.6  ALBUMIN 4.1   Recent Labs    11/07/19 1849 11/08/19 0643  WBC 11.6* 10.2  NEUTROABS 7.8* 7.0  HGB 8.0* 7.9*  HCT 29.1* 29.2*  MCV 74.2* 73.6*  PLT 615* 595*   No results for input(s): LABPROT, INR in the last 72 hours.    Assessment/Plan: -Microcytic anemia. -History of gastric ulcer.  EGD 05/29 showed prepyloric gastric erosion and no evidence of active bleeding.  Recommendations ---------------------- -Proceed with colonoscopy today  Risks (bleeding, infection, bowel perforation that could require surgery, sedation-related changes in cardiopulmonary systems), benefits (identification and possible treatment of source of symptoms, exclusion of certain causes of symptoms), and alternatives (watchful waiting, radiographic imaging studies, empiric medical treatment)  were explained to patient/family in detail and patient wishes to proceed.   [name] MD, FACP 11/08/2019, 9:43 AM  Contact #  [phone]

## 2019-11-08 NOTE — Discharge Instructions (Signed)
Anemia  Anemia is a condition in which you do not have enough red blood cells or hemoglobin. Hemoglobin is a substance in red blood cells that carries oxygen. When you do not have enough red blood cells or hemoglobin (are anemic), your body cannot get enough oxygen and your organs may not work properly. As a result, you may feel very tired or have other problems. What are the causes? Common causes of anemia include:  Excessive bleeding. Anemia can be caused by excessive bleeding inside or outside the body, including bleeding from the intestine or from periods in women.  Poor nutrition.  Long-lasting (chronic) kidney, thyroid, and liver disease.  Bone marrow disorders.  Cancer and treatments for cancer.  HIV (human immunodeficiency virus) and AIDS (acquired immunodeficiency syndrome).  Treatments for HIV and AIDS.  Spleen problems.  Blood disorders.  Infections, medicines, and autoimmune disorders that destroy red blood cells. What are the signs or symptoms? Symptoms of this condition include:  Minor weakness.  Dizziness.  Headache.  Feeling heartbeats that are irregular or faster than normal (palpitations).  Shortness of breath, especially with exercise.  Paleness.  Cold sensitivity.  Indigestion.  Nausea.  Difficulty sleeping.  Difficulty concentrating. Symptoms may occur suddenly or develop slowly. If your anemia is mild, you may not have symptoms. How is this diagnosed? This condition is diagnosed based on:  Blood tests.  Your medical history.  A physical exam.  Bone marrow biopsy. Your health care provider may also check your stool (feces) for blood and may do additional testing to look for the cause of your bleeding. You may also have other tests, including:  Imaging tests, such as a CT scan or MRI.  Endoscopy.  Colonoscopy. How is this treated? Treatment for this condition depends on the cause. If you continue to lose a lot of blood, you may  need to be treated at a hospital. Treatment may include:  Taking supplements of iron, vitamin S31, or folic acid.  Taking a hormone medicine (erythropoietin) that can help to stimulate red blood cell growth.  Having a blood transfusion. This may be needed if you lose a lot of blood.  Making changes to your diet.  Having surgery to remove your spleen. Follow these instructions at home:  Take over-the-counter and prescription medicines only as told by your health care provider.  Take supplements only as told by your health care provider.  Follow any diet instructions that you were given.  Keep all follow-up visits as told by your health care provider. This is important. Contact a health care provider if:  You develop new bleeding anywhere in the body. Get help right away if:  You are very weak.  You are short of breath.  You have pain in your abdomen or chest.  You are dizzy or feel faint.  You have trouble concentrating.  You have bloody or black, tarry stools.  You vomit repeatedly or you vomit up blood. Summary  Anemia is a condition in which you do not have enough red blood cells or enough of a substance in your red blood cells that carries oxygen (hemoglobin).  Symptoms may occur suddenly or develop slowly.  If your anemia is mild, you may not have symptoms.  This condition is diagnosed with blood tests as well as a medical history and physical exam. Other tests may be needed.  Treatment for this condition depends on the cause of the anemia. This information is not intended to replace advice given to you by  your health care provider. Make sure you discuss any questions you have with your health care provider. Document Revised: 05/10/2017 Document Reviewed: 06/29/2016 Elsevier Patient Education  [place].

## 2019-11-08 NOTE — Progress Notes (Signed)
PT Cancellation Note  Patient Details Name: Kristopher [name] MRN: [id number] DOB: 29-Sep-1950   Cancelled Treatment:    Reason Eval/Treat Not Completed: Patient at procedure or test/unavailable   Plan to retry later today if time;  Otherwise, will follow up for PT eval tomorrow;   Van Clines, PT  Acute Rehabilitation Services Pager [phone] Office [phone]    [name] 11/08/2019, 10:48 AM

## 2019-11-08 NOTE — Progress Notes (Signed)
Patient arrived back from PACU for his colonoscopy. VSS. He denies any pain. Order to be d/c after 5PM. HE will order his lunch. Patient agrees with the plan and will call his cousin for transportation.   Sim Boast, RN

## 2019-11-08 NOTE — Transfer of Care (Signed)
Immediate Anesthesia Transfer of Care Note  Patient: Kristopher [name]  Procedure(s) Performed: COLONOSCOPY WITH PROPOFOL (N/A ) POLYPECTOMY BIOPSY  Patient Location: PACU  Anesthesia Type:MAC  Level of Consciousness: drowsy and patient cooperative  Airway & Oxygen Therapy: Patient Spontanous Breathing and Patient connected to face mask oxygen  Post-op Assessment: Report given to RN and Post -op Vital signs reviewed and stable  Post vital signs: Reviewed and stable  Last Vitals:  Vitals Value Taken Time  BP 103/47 11/08/19 1105  Temp 36.4 C 11/08/19 1105  Pulse 72 11/08/19 1105  Resp 31 11/08/19 1105  SpO2 100 % 11/08/19 1105    Last Pain:  Vitals:   11/08/19 1105  TempSrc:   PainSc: 0-No pain      Patients Stated Pain Goal: 0 (35/68/61 6837)  Complications: No apparent anesthesia complications

## 2019-11-08 NOTE — Brief Op Note (Signed)
11/06/2019 - 11/08/2019  11:09 AM  PATIENT:  Kristopher [name]  69 y.o. male  PRE-OPERATIVE DIAGNOSIS:  Anemia  POST-OPERATIVE DIAGNOSIS:  colon polyps bx and snared  PROCEDURE:  Procedure(s): COLONOSCOPY WITH PROPOFOL (N/A) POLYPECTOMY BIOPSY  SURGEON:  Surgeon(s) and Role:    * Omer Puccinelli, MD - Primary  Findings ----------- -Colonoscopy showed multiple colon polyps which were removed with biopsy forcep, cold and hot snare.  He also showed diverticulosis and hemorrhoids.  No evidence of active bleeding  Recommendations -------------------------- -Start soft diet and advance as tolerated -Follow path report -Recommend repeat colonoscopy probably in 3 years because of multiple polyps -Continue Protonix 40 mg once a day for 2 months.  Avoid NSAIDs -No further inpatient GI work-up planned. -GI will sign off.  Call us back if needed - D/W sister   [name] MD, FACP 11/08/2019, 11:18 AM  Contact #  [phone]

## 2019-11-08 NOTE — Anesthesia Procedure Notes (Signed)
Procedure Name: MAC Date/Time: 11/08/2019 10:23 AM Performed by: Kathryne Hitch, CRNA Pre-anesthesia Checklist: Patient identified, Emergency Drugs available, Suction available and Patient being monitored Patient Re-evaluated:Patient Re-evaluated prior to induction Oxygen Delivery Method: Simple face mask Preoxygenation: Pre-oxygenation with 100% oxygen Induction Type: IV induction Dental Injury: Teeth and Oropharynx as per pre-operative assessment

## 2019-11-08 NOTE — Progress Notes (Signed)
Discharge instruction reviewed patient. All questions answered at this time. Awaiting for transportation.

## 2019-11-08 NOTE — Op Note (Signed)
[facility] Patient Name: Kristopher [name] Procedure Date : 11/08/2019 MRN: [id number] Attending MD: Otis Brace , MD Date of Birth: 09/07/1950 CSN: [id number] Age: 69 Admit Type: Inpatient Procedure:                Colonoscopy Indications:              Last colonoscopy: date unknown (unable to locate                            last colonoscopy report), Iron deficiency anemia Providers:                Otis Brace, MD, Cleda Daub, RN, William Dalton, Technician Referring MD:              Medicines:                Sedation Administered by an Anesthesia Professional Complications:            No immediate complications. Estimated Blood Loss:     Estimated blood loss was minimal. Procedure:                Pre-Anesthesia Assessment:                           - Prior to the procedure, a History and Physical                            was performed, and patient medications and                            allergies were reviewed. The patient's tolerance of                            previous anesthesia was also reviewed. The risks                            and benefits of the procedure and the sedation                            options and risks were discussed with the patient.                            All questions were answered, and informed consent                            was obtained. Prior Anticoagulants: The patient has                            taken no previous anticoagulant or antiplatelet                            agents. ASA Grade Assessment: IV - A patient with  severe systemic disease that is a constant threat                            to life. After reviewing the risks and benefits,                            the patient was deemed in satisfactory condition to                            undergo the procedure.                           After obtaining informed consent, the colonoscope                  was passed under direct vision. Throughout the                            procedure, the patient's blood pressure, pulse, and                            oxygen saturations were monitored continuously. The                            PCF-H190DL ([id number]) Olympus pediatric colonscope                            was introduced through the anus and advanced to the                            the cecum, identified by appendiceal orifice and                            ileocecal valve. The colonoscopy was performed                            without difficulty. The patient tolerated the                            procedure well. The quality of the bowel                            preparation was adequate to identify polyps 6 mm                            and larger in size. Scope In: 10:29:31 AM Scope Out: 10:58:09 AM Scope Withdrawal Time: 0 hours 16 minutes 16 seconds  Total Procedure Duration: 0 hours 28 minutes 38 seconds  Findings:      The perianal and digital rectal examinations were normal.      Two sessile polyps were found in the cecum. The polyps were 2 to 3 mm in       size. These polyps were removed with a cold biopsy forceps. Resection       and retrieval were complete.      A 3 mm polyp was found  in the hepatic flexure. The polyp was sessile.       The polyp was removed with a cold snare. Resection and retrieval were       complete.      Two sessile polyps were found in the transverse colon. The polyps were 3       to 8 mm in size. These polyps were removed with a cold snare. Resection       and retrieval were complete.      A 10 mm polyp was found in the proximal descending colon. The polyp was       semi-pedunculated. The polyp was removed with a hot snare. Resection and       retrieval were complete.      Three sessile polyps were found in the sigmoid colon. The polyps were 3       to 5 mm in size. These polyps were removed with a cold snare. Resection       and  retrieval were complete.      A few diverticula were found in the sigmoid colon.      A 6 mm polyp was found in the rectum. The polyp was sessile. The polyp       was removed with a cold snare. Resection and retrieval were complete.      Internal hemorrhoids were found during retroflexion. The hemorrhoids       were medium-sized. Impression:               - Two 2 to 3 mm polyps in the cecum, removed with a                            cold biopsy forceps. Resected and retrieved.                           - One 3 mm polyp at the hepatic flexure, removed                            with a cold snare. Resected and retrieved.                           - Two 3 to 8 mm polyps in the transverse colon,                            removed with a cold snare. Resected and retrieved.                           - One 10 mm polyp in the proximal descending colon,                            removed with a hot snare. Resected and retrieved.                           - Three 3 to 5 mm polyps in the sigmoid colon,                            removed with a cold snare. Resected and retrieved.                           -  Diverticulosis in the sigmoid colon.                           - One 6 mm polyp in the rectum, removed with a cold                            snare. Resected and retrieved.                           - Internal hemorrhoids. Recommendation:           - Patient has a contact number available for                            emergencies. The signs and symptoms of potential                            delayed complications were discussed with the                            patient. Return to normal activities tomorrow.                            Written discharge instructions were provided to the                            patient.                           - Resume previous diet.                           - Continue present medications.                           - Await pathology results.                            - Repeat colonoscopy in 3 years for surveillance of                            multiple polyps. Procedure Code(s):        --- Professional ---                           [phone], Colonoscopy, flexible; with removal of                            tumor(s), polyp(s), or other lesion(s) by snare                            technique                           [id number], 59, Colonoscopy, flexible; with biopsy,  single or multiple Diagnosis Code(s):        --- Professional ---                           K63.5, Polyp of colon                           K62.1, Rectal polyp                           K64.8, Other hemorrhoids                           D50.9, Iron deficiency anemia, unspecified                           K57.30, Diverticulosis of large intestine without                            perforation or abscess without bleeding CPT copyright 2019 American Medical Association. All rights reserved. The codes documented in this report are preliminary and upon coder review may  be revised to meet current compliance requirements. [name], MD [name], MD 11/08/2019 11:07:41 AM Number of Addenda: 0

## 2019-11-08 NOTE — Anesthesia Postprocedure Evaluation (Signed)
Anesthesia Post Note  Patient: Kristopher [name]  Procedure(s) Performed: COLONOSCOPY WITH PROPOFOL (N/A ) POLYPECTOMY BIOPSY     Patient location during evaluation: PACU Anesthesia Type: MAC Level of consciousness: awake and alert Pain management: pain level controlled Vital Signs Assessment: post-procedure vital signs reviewed and stable Respiratory status: spontaneous breathing and respiratory function stable Cardiovascular status: stable Postop Assessment: no apparent nausea or vomiting Anesthetic complications: no    Last Vitals:  Vitals:   11/08/19 1120 11/08/19 1131  BP: (!) 92/49 108/64  Pulse: 69 69  Resp: 17 16  Temp: 36.7 C (!) 36.4 C  SpO2: 100% 91%    Last Pain:  Vitals:   11/08/19 1131  TempSrc: Oral  PainSc:     LLE Motor Response: Purposeful movement (11/08/19 1152)   RLE Motor Response: Purposeful movement (11/08/19 1152)        Shunda Rabadi DANIEL

## 2019-11-10 LAB — SURGICAL PATHOLOGY

## 2019-11-12 LAB — SURGICAL PATHOLOGY

## 2019-11-23 ENCOUNTER — Telehealth (HOSPITAL_COMMUNITY): Payer: Self-pay

## 2019-11-23 NOTE — Telephone Encounter (Signed)
Called pt to sch home visit. No answer, unable to LVM, it just rings.  Will try again later.   Kerry Hough, EMT-Paramedic  11/23/19

## 2019-12-15 ENCOUNTER — Other Ambulatory Visit (HOSPITAL_COMMUNITY): Payer: Self-pay

## 2019-12-15 MED ORDER — SPIRONOLACTONE 25 MG PO TABS: 25.0000 mg | ORAL_TABLET | Freq: Every day | ORAL | 3 refills | Status: DC

## 2019-12-17 ENCOUNTER — Other Ambulatory Visit (HOSPITAL_COMMUNITY): Payer: Self-pay

## 2019-12-17 ENCOUNTER — Telehealth (HOSPITAL_COMMUNITY): Payer: Self-pay | Admitting: *Deleted

## 2019-12-17 NOTE — Progress Notes (Signed)
Paramedicine Encounter    Patient ID: Kristopher [name], male    DOB: 26-Oct-1950, 69 y.o.   MRN: [id number]   Patient Care Team: Shirlean Mylar, MD as PCP - General (Family Medicine) Quintella Reichert, MD as PCP - Cardiology (Cardiology) Bensimhon, Bevelyn Buckles, MD as PCP - Advanced Heart Failure (Cardiology) [name], LCSW as Social Worker (Licensed Clinical Social Worker)  Patient Active Problem List   Diagnosis Date Noted  . Symptomatic anemia 11/06/2019  . Thrombocytosis (HCC) 11/06/2019  . COPD (chronic obstructive pulmonary disease) (HCC) 11/06/2019  . Dyslipidemia 11/06/2019  . OSA (obstructive sleep apnea) 11/18/2018  . Morbid obesity (HCC) 11/18/2018  . Acute on chronic combined systolic and diastolic congestive heart failure, NYHA class 4 (HCC) 07/21/2018  . PUD (peptic ulcer disease) 08/17/2016  . Acute esophagitis 08/17/2016  . Pulmonary HTN (HCC) 08/17/2016  . Lymphadenopathy 08/17/2016  . CAD (coronary artery disease) 08/16/2016  . Anemia   . DCM (dilated cardiomyopathy) (HCC)   . HTN (hypertension)   . Chronic combined systolic and diastolic heart failure (HCC) 08/11/2016  . Acute blood loss anemia 08/11/2016  . Acute upper GI bleed 08/10/2016  . DOE (dyspnea on exertion) 08/10/2016  . LBBB (left bundle branch block) 08/10/2016    Current Outpatient Medications:  .  acetaminophen (TYLENOL) 325 MG tablet, Take 650 mg by mouth every 6 (six) hours as needed for mild pain. , Disp: , Rfl:  .  albuterol (PROVENTIL) (2.5 MG/3ML) 0.083% nebulizer solution, Inhale 3 mLs into the lungs every 6 (six) hours as needed for wheezing or shortness of breath., Disp: , Rfl:  .  aspirin 81 MG chewable tablet, Chew 1 tablet (81 mg total) by mouth daily., Disp: 30 tablet, Rfl: 6 .  atorvastatin (LIPITOR) 40 MG tablet, TAKE 1 TABLET BY MOUTH ONCE DAILY AT 6PM (Patient taking differently: Take 40 mg by mouth daily. ), Disp: 90 tablet, Rfl: 3 .  ferrous sulfate 325 (65 FE) MG tablet, Take 325 mg  by mouth daily with breakfast., Disp: , Rfl:  .  furosemide (LASIX) 40 MG tablet, Take 2 tablets (80 mg total) by mouth 2 (two) times daily., Disp: 120 tablet, Rfl: 3 .  ipratropium (ATROVENT) 0.02 % nebulizer solution, Take 2.5 mLs (0.5 mg total) by nebulization 2 (two) times daily., Disp: 75 mL, Rfl: 12 .  omeprazole (PRILOSEC) 40 MG capsule, Take 40 mg by mouth daily as needed (gerd). , Disp: , Rfl:  .  potassium chloride (KLOR-CON) 10 MEQ tablet, Take 1 tablet (10 mEq total) by mouth daily., Disp: 30 tablet, Rfl: 3 .  sacubitril-valsartan (ENTRESTO) 97-103 MG, Take 1 tablet by mouth 2 (two) times daily., Disp: 60 tablet, Rfl: 6 .  spironolactone (ALDACTONE) 25 MG tablet, Take 1 tablet (25 mg total) by mouth daily., Disp: 90 tablet, Rfl: 3 .  SYMBICORT 160-4.5 MCG/ACT inhaler, Inhale 1 puff into the lungs in the morning and at bedtime., Disp: , Rfl:  .  carvedilol (COREG) 6.25 MG tablet, Take 1.5 tablets (9.375 mg total) by mouth 2 (two) times daily with a meal for 30 days., Disp: 270 tablet, Rfl: 3 Allergies  Allergen Reactions  . Penicillins     Pt reports it makes him feel shaky Has patient had a PCN reaction causing immediate rash, facial/tongue/throat swelling, SOB or lightheadedness with hypotension: YES Has patient had a PCN reaction causing severe rash involving mucus membranes or skin necrosis: NO Has patient had a PCN reaction that required hospitalization NO Has  patient had a PCN reaction occurring within the last 10 years: NO If all of the above answers are "NO", then may proceed with Cephalosporin use.      Social History   Socioeconomic History  . Marital status: Single    Spouse name: Not on file  . Number of children: Not on file  . Years of education: Not on file  . Highest education level: Not on file  Occupational History  . Occupation: retired  Tobacco Use  . Smoking status: Former Smoker    Packs/day: 0.50    Years: 44.00    Pack years: 22.00    Types:  Cigarettes    Quit date: 06/11/1998    Years since quitting: 21.5  . Smokeless tobacco: Never Used  Vaping Use  . Vaping Use: Never used  Substance and Sexual Activity  . Alcohol use: Not Currently    Comment: h/o heavy use  . Drug use: Not Currently  . Sexual activity: Not on file  Other Topics Concern  . Not on file  Social History Narrative  . Not on file   Social Determinants of Health   Financial Resource Strain:   . Difficulty of Paying Living Expenses:   Food Insecurity:   . Worried About Programme researcher, broadcasting/film/video in the Last Year:   . Barista in the Last Year:   Transportation Needs:   . Freight forwarder (Medical):   Marland Kitchen Lack of Transportation (Non-Medical):   Physical Activity:   . Days of Exercise per Week:   . Minutes of Exercise per Session:   Stress:   . Feeling of Stress :   Social Connections:   . Frequency of Communication with Friends and Family:   . Frequency of Social Gatherings with Friends and Family:   . Attends Religious Services:   . Active Member of Clubs or Organizations:   . Attends Banker Meetings:   Marland Kitchen Marital Status:   Intimate Partner Violence:   . Fear of Current or Ex-Partner:   . Emotionally Abused:   Marland Kitchen Physically Abused:   . Sexually Abused:     Physical Exam      No future appointments.  BP (!) 80/52   Pulse 92   Temp 97.6 F (36.4 C)   Resp 18   Wt 233 lb (105.7 kg)   SpO2 96%   BMI 36.49 kg/m   B/p standing-98/palpated Weight yesterday-233 Last visit weight-232  Pt reports he is doing good. He denies sob, no dizziness, no c/p.  no issues since he had to get some blood the other month.  He had went out of town to beach for a few days and didn't take his lasix while he was traveling. meds verified. He does his own pill box. No issues since he has left the hosp.  He has not heard back from symbicort inhaler assistance. He gave it to dr webb.  His b/p is low upon standing and he felt a little  woozy when he stood up for orthostatics and b/p standing it jumped to 98/systolic.  Relayed this to nursing staff at clinic and will relay to providers and if any changes need to be made I will get notified.   Kerry Hough, EMT-Paramedic [phone] [facility] Paramedic  12/17/19

## 2019-12-17 NOTE — Telephone Encounter (Signed)
Continue current regimen.   If dizziness persists please ask him to call back.   Thanks,   Tonye Becket NP- C

## 2019-12-17 NOTE — Telephone Encounter (Signed)
Patient was seen by Florentina Addison w/paramedicine his BP was 80/52 sitting when he stood up his systolic bp was 98. Pt was a little dizzy when he stood up. Patient has no other complaints and is taking all medications as prescribed.  Routed to Amy Clegg,NP for advice

## 2019-12-17 NOTE — Telephone Encounter (Signed)
Katie aware 

## 2020-01-05 ENCOUNTER — Other Ambulatory Visit (HOSPITAL_COMMUNITY): Payer: Self-pay | Admitting: Internal Medicine

## 2020-01-20 ENCOUNTER — Other Ambulatory Visit (HOSPITAL_COMMUNITY): Payer: Self-pay | Admitting: *Deleted

## 2020-01-20 ENCOUNTER — Other Ambulatory Visit (HOSPITAL_COMMUNITY): Payer: Self-pay

## 2020-01-20 MED ORDER — SACUBITRIL-VALSARTAN 97-103 MG PO TABS: 1.0000 | ORAL_TABLET | Freq: Two times a day (BID) | ORAL | 6 refills | Status: DC

## 2020-01-20 NOTE — Progress Notes (Signed)
Paramedicine Encounter    Patient ID: Kristopher [name], male    DOB: [birth year], 69 y.o.   MRN: [id number]   Patient Care Team: Shirlean Mylar, MD as PCP - General (Family Medicine) Quintella Reichert, MD as PCP - Cardiology (Cardiology) Bensimhon, Bevelyn Buckles, MD as PCP - Advanced Heart Failure (Cardiology) [name], LCSW as Social Worker (Licensed Clinical Social Worker)  Patient Active Problem List   Diagnosis Date Noted  . Symptomatic anemia 11/06/2019  . Thrombocytosis (HCC) 11/06/2019  . COPD (chronic obstructive pulmonary disease) (HCC) 11/06/2019  . Dyslipidemia 11/06/2019  . OSA (obstructive sleep apnea) 11/18/2018  . Morbid obesity (HCC) 11/18/2018  . Acute on chronic combined systolic and diastolic congestive heart failure, NYHA class 4 (HCC) 07/21/2018  . PUD (peptic ulcer disease) 08/17/2016  . Acute esophagitis 08/17/2016  . Pulmonary HTN (HCC) 08/17/2016  . Lymphadenopathy 08/17/2016  . CAD (coronary artery disease) 08/16/2016  . Anemia   . DCM (dilated cardiomyopathy) (HCC)   . HTN (hypertension)   . Chronic combined systolic and diastolic heart failure (HCC) 08/11/2016  . Acute blood loss anemia 08/11/2016  . Acute upper GI bleed 08/10/2016  . DOE (dyspnea on exertion) 08/10/2016  . LBBB (left bundle branch block) 08/10/2016    Current Outpatient Medications:  .  acetaminophen (TYLENOL) 325 MG tablet, Take 650 mg by mouth every 6 (six) hours as needed for mild pain. , Disp: , Rfl:  .  albuterol (PROVENTIL) (2.5 MG/3ML) 0.083% nebulizer solution, Inhale 3 mLs into the lungs every 6 (six) hours as needed for wheezing or shortness of breath., Disp: , Rfl:  .  aspirin 81 MG chewable tablet, Chew 1 tablet (81 mg total) by mouth daily., Disp: 30 tablet, Rfl: 6 .  atorvastatin (LIPITOR) 40 MG tablet, TAKE 1 TABLET BY MOUTH ONCE DAILY AT 6PM (Patient taking differently: Take 40 mg by mouth daily. ), Disp: 90 tablet, Rfl: 3 .  ferrous sulfate 325 (65 FE) MG tablet, Take 325 mg  by mouth daily with breakfast., Disp: , Rfl:  .  furosemide (LASIX) 40 MG tablet, Take 2 tablets (80 mg total) by mouth 2 (two) times daily., Disp: 120 tablet, Rfl: 3 .  ipratropium (ATROVENT) 0.02 % nebulizer solution, Take 2.5 mLs (0.5 mg total) by nebulization 2 (two) times daily., Disp: 75 mL, Rfl: 12 .  omeprazole (PRILOSEC) 40 MG capsule, Take 40 mg by mouth daily as needed (gerd). , Disp: , Rfl:  .  potassium chloride (KLOR-CON) 10 MEQ tablet, Take 1 tablet (10 mEq total) by mouth daily., Disp: 30 tablet, Rfl: 3 .  sacubitril-valsartan (ENTRESTO) 97-103 MG, Take 1 tablet by mouth 2 (two) times daily., Disp: 60 tablet, Rfl: 6 .  spironolactone (ALDACTONE) 25 MG tablet, Take 1 tablet (25 mg total) by mouth daily., Disp: 90 tablet, Rfl: 3 .  SYMBICORT 160-4.5 MCG/ACT inhaler, Inhale 1 puff into the lungs in the morning and at bedtime., Disp: , Rfl:  .  carvedilol (COREG) 6.25 MG tablet, Take 1.5 tablets (9.375 mg total) by mouth 2 (two) times daily with a meal for 30 days., Disp: 270 tablet, Rfl: 3 .  potassium chloride (KLOR-CON) 10 MEQ tablet, Take 1 tablet by mouth once daily (Patient not taking: Reported on 01/20/2020), Disp: 30 tablet, Rfl: 3 Allergies  Allergen Reactions  . Penicillins     Pt reports it makes him feel shaky Has patient had a PCN reaction causing immediate rash, facial/tongue/throat swelling, SOB or lightheadedness with hypotension: YES Has  patient had a PCN reaction causing severe rash involving mucus membranes or skin necrosis: NO Has patient had a PCN reaction that required hospitalization NO Has patient had a PCN reaction occurring within the last 10 years: NO If all of the above answers are "NO", then may proceed with Cephalosporin use.      Social History   Socioeconomic History  . Marital status: Single    Spouse name: Not on file  . Number of children: Not on file  . Years of education: Not on file  . Highest education level: Not on file  Occupational  History  . Occupation: retired  Tobacco Use  . Smoking status: Former Smoker    Packs/day: 0.50    Years: 44.00    Pack years: 22.00    Types: Cigarettes    Quit date: 06/11/1998    Years since quitting: 21.6  . Smokeless tobacco: Never Used  Vaping Use  . Vaping Use: Never used  Substance and Sexual Activity  . Alcohol use: Not Currently    Comment: h/o heavy use  . Drug use: Not Currently  . Sexual activity: Not on file  Other Topics Concern  . Not on file  Social History Narrative  . Not on file   Social Determinants of Health   Financial Resource Strain:   . Difficulty of Paying Living Expenses:   Food Insecurity:   . Worried About Programme researcher, broadcasting/film/video in the Last Year:   . Barista in the Last Year:   Transportation Needs:   . Freight forwarder (Medical):   Marland Kitchen Lack of Transportation (Non-Medical):   Physical Activity:   . Days of Exercise per Week:   . Minutes of Exercise per Session:   Stress:   . Feeling of Stress :   Social Connections:   . Frequency of Communication with Friends and Family:   . Frequency of Social Gatherings with Friends and Family:   . Attends Religious Services:   . Active Member of Clubs or Organizations:   . Attends Banker Meetings:   Marland Kitchen Marital Status:   Intimate Partner Violence:   . Fear of Current or Ex-Partner:   . Emotionally Abused:   Marland Kitchen Physically Abused:   . Sexually Abused:     Physical Exam      No future appointments.  BP 118/64   Pulse 94   Temp 98.2 F (36.8 C)   Resp 18   Wt 234 lb (106.1 kg)   SpO2 98%   BMI 36.65 kg/m   Weight yesterday-234 Last visit weight-233  Pt reports he is doing well. He denies any issues or concerns. He denied c/p. Denies sob. No edema noted.  His PCP sent in the wrong dosage for the entresto so I called clinic to get that squared away.  Weight got up to 236 lbs but usually runs between 230-234.  He had been taking extra of his omeprazole on  accident-he lost track of him taking it so he will have to get some OTC for when he runs out-he wont be able to get it refilled until sept.  meds verified, he did have a couple errors in there but it was fixed.  He said he had a lot of distractions when he was trying to fill pill box but when he takes IF he notices something off he fixes it then. But he isnt sure if he has been taking the 2 full tablets of carvedilol or  not and for how long. I will make his a med list and I suggested he fill pill box without any distractions if possible.   Kerry Hough, EMT-Paramedic [phone] [facility] Paramedic  01/20/20

## 2020-02-11 ENCOUNTER — Other Ambulatory Visit (HOSPITAL_COMMUNITY): Payer: Self-pay | Admitting: Internal Medicine

## 2020-03-03 ENCOUNTER — Other Ambulatory Visit (HOSPITAL_COMMUNITY): Payer: Self-pay

## 2020-03-03 NOTE — Progress Notes (Signed)
Paramedicine Encounter    Patient ID: Kristopher [name], male    DOB: June 06, 1951, 69 y.o.   MRN: [id number]   Patient Care Team: Shirlean Mylar, MD as PCP - General (Family Medicine) Quintella Reichert, MD as PCP - Cardiology (Cardiology) Bensimhon, Bevelyn Buckles, MD as PCP - Advanced Heart Failure (Cardiology) [name], LCSW as Social Worker (Licensed Clinical Social Worker)  Patient Active Problem List   Diagnosis Date Noted  . Symptomatic anemia 11/06/2019  . Thrombocytosis (HCC) 11/06/2019  . COPD (chronic obstructive pulmonary disease) (HCC) 11/06/2019  . Dyslipidemia 11/06/2019  . OSA (obstructive sleep apnea) 11/18/2018  . Morbid obesity (HCC) 11/18/2018  . Acute on chronic combined systolic and diastolic congestive heart failure, NYHA class 4 (HCC) 07/21/2018  . PUD (peptic ulcer disease) 08/17/2016  . Acute esophagitis 08/17/2016  . Pulmonary HTN (HCC) 08/17/2016  . Lymphadenopathy 08/17/2016  . CAD (coronary artery disease) 08/16/2016  . Anemia   . DCM (dilated cardiomyopathy) (HCC)   . HTN (hypertension)   . Chronic combined systolic and diastolic heart failure (HCC) 08/11/2016  . Acute blood loss anemia 08/11/2016  . Acute upper GI bleed 08/10/2016  . DOE (dyspnea on exertion) 08/10/2016  . LBBB (left bundle branch block) 08/10/2016    Current Outpatient Medications:  .  acetaminophen (TYLENOL) 325 MG tablet, Take 650 mg by mouth every 6 (six) hours as needed for mild pain. , Disp: , Rfl:  .  albuterol (PROVENTIL) (2.5 MG/3ML) 0.083% nebulizer solution, Inhale 3 mLs into the lungs every 6 (six) hours as needed for wheezing or shortness of breath., Disp: , Rfl:  .  aspirin 81 MG chewable tablet, Chew 1 tablet (81 mg total) by mouth daily., Disp: 30 tablet, Rfl: 6 .  atorvastatin (LIPITOR) 40 MG tablet, TAKE 1 TABLET BY MOUTH ONCE DAILY AT 6PM (Patient taking differently: Take 40 mg by mouth daily. ), Disp: 90 tablet, Rfl: 3 .  ferrous sulfate 325 (65 FE) MG tablet, Take 325 mg  by mouth daily with breakfast., Disp: , Rfl:  .  furosemide (LASIX) 40 MG tablet, Take 2 tablets (80 mg total) by mouth 2 (two) times daily., Disp: 120 tablet, Rfl: 11 .  ipratropium (ATROVENT) 0.02 % nebulizer solution, Take 2.5 mLs (0.5 mg total) by nebulization 2 (two) times daily., Disp: 75 mL, Rfl: 12 .  omeprazole (PRILOSEC) 40 MG capsule, Take 40 mg by mouth daily as needed (gerd). , Disp: , Rfl:  .  potassium chloride (KLOR-CON) 10 MEQ tablet, Take 1 tablet (10 mEq total) by mouth daily., Disp: 30 tablet, Rfl: 3 .  sacubitril-valsartan (ENTRESTO) 97-103 MG, Take 1 tablet by mouth 2 (two) times daily., Disp: 60 tablet, Rfl: 6 .  spironolactone (ALDACTONE) 25 MG tablet, Take 1 tablet (25 mg total) by mouth daily., Disp: 90 tablet, Rfl: 3 .  SYMBICORT 160-4.5 MCG/ACT inhaler, Inhale 1 puff into the lungs in the morning and at bedtime., Disp: , Rfl:  .  carvedilol (COREG) 6.25 MG tablet, Take 1.5 tablets (9.375 mg total) by mouth 2 (two) times daily with a meal for 30 days., Disp: 270 tablet, Rfl: 3 .  potassium chloride (KLOR-CON) 10 MEQ tablet, Take 1 tablet by mouth once daily (Patient not taking: Reported on 01/20/2020), Disp: 30 tablet, Rfl: 3 Allergies  Allergen Reactions  . Penicillins     Pt reports it makes him feel shaky Has patient had a PCN reaction causing immediate rash, facial/tongue/throat swelling, SOB or lightheadedness with hypotension: YES Has  patient had a PCN reaction causing severe rash involving mucus membranes or skin necrosis: NO Has patient had a PCN reaction that required hospitalization NO Has patient had a PCN reaction occurring within the last 10 years: NO If all of the above answers are "NO", then may proceed with Cephalosporin use.      Social History   Socioeconomic History  . Marital status: Single    Spouse name: Not on file  . Number of children: Not on file  . Years of education: Not on file  . Highest education level: Not on file  Occupational  History  . Occupation: retired  Tobacco Use  . Smoking status: Former Smoker    Packs/day: 0.50    Years: 44.00    Pack years: 22.00    Types: Cigarettes    Quit date: 06/11/1998    Years since quitting: 21.7  . Smokeless tobacco: Never Used  Vaping Use  . Vaping Use: Never used  Substance and Sexual Activity  . Alcohol use: Not Currently    Comment: h/o heavy use  . Drug use: Not Currently  . Sexual activity: Not on file  Other Topics Concern  . Not on file  Social History Narrative  . Not on file   Social Determinants of Health   Financial Resource Strain:   . Difficulty of Paying Living Expenses: Not on file  Food Insecurity:   . Worried About Programme researcher, broadcasting/film/video in the Last Year: Not on file  . Ran Out of Food in the Last Year: Not on file  Transportation Needs:   . Lack of Transportation (Medical): Not on file  . Lack of Transportation (Non-Medical): Not on file  Physical Activity:   . Days of Exercise per Week: Not on file  . Minutes of Exercise per Session: Not on file  Stress:   . Feeling of Stress : Not on file  Social Connections:   . Frequency of Communication with Friends and Family: Not on file  . Frequency of Social Gatherings with Friends and Family: Not on file  . Attends Religious Services: Not on file  . Active Member of Clubs or Organizations: Not on file  . Attends Banker Meetings: Not on file  . Marital Status: Not on file  Intimate Partner Violence:   . Fear of Current or Ex-Partner: Not on file  . Emotionally Abused: Not on file  . Physically Abused: Not on file  . Sexually Abused: Not on file    Physical Exam      No future appointments.  BP 102/60   Pulse 86   Resp 20   SpO2 95%  Standing b/p-110/64 Weight yesterday-234 Last visit weight-234  Pt reports he has been doing well. He denies any issues, no dizziness, no c/p, no sob.no edema noted.  Weight stable. Staying under 235lbs.  Diet good. meds checked and  pill box checked and there were a few errors but once he takes the meds out of the tray he checks it before he takes it and corrects if anything is missing. His lungs are a little wheezy but he said he is out of his mucinex. He is going to get some today. He denies sob.   Kerry Hough, EMT-Paramedic [phone] [facility] Paramedic  03/03/20

## 2020-05-01 ENCOUNTER — Other Ambulatory Visit (HOSPITAL_COMMUNITY): Payer: Self-pay | Admitting: Internal Medicine

## 2020-05-26 ENCOUNTER — Telehealth (HOSPITAL_COMMUNITY): Payer: Self-pay | Admitting: Pharmacy Technician

## 2020-05-26 NOTE — Telephone Encounter (Signed)
Spoke to patient regarding re-enrollment of Entresto assistance with [employer]. Emailed the patient's application to his daughter at [email or web address] to sign. Will fax in once signatures are obtained.

## 2020-07-30 ENCOUNTER — Other Ambulatory Visit (HOSPITAL_COMMUNITY): Payer: Self-pay | Admitting: Internal Medicine

## 2020-08-05 ENCOUNTER — Other Ambulatory Visit (HOSPITAL_COMMUNITY): Payer: Self-pay | Admitting: Internal Medicine

## 2020-08-05 ENCOUNTER — Telehealth (HOSPITAL_COMMUNITY): Payer: Self-pay | Admitting: Pharmacist

## 2020-08-05 NOTE — Telephone Encounter (Signed)
Advanced Heart Failure Patient Advocate Encounter   Patient was approved to receive Entresto from [employer]   Patient ID: [id number]  Effective dates: 08/05/2020 through 06/10/2021    Karle Plumber, PharmD, BCPS, BCCP, CPP Heart Failure Clinic Pharmacist [phone]

## 2020-08-16 DIAGNOSIS — J45998 Other asthma: Secondary | ICD-10-CM | POA: Diagnosis not present

## 2020-08-16 DIAGNOSIS — G4731 Primary central sleep apnea: Secondary | ICD-10-CM | POA: Diagnosis not present

## 2020-09-04 IMAGING — DX DG CHEST 2V
2 series · 2 of 2 positions shown · non-contrast
Comparison: 01/14/2018

CLINICAL DATA: Shortness of breath and productive cough for 3-4
weeks. History of smoking.

EXAM:
CHEST - 2 VIEW

[chest lat]
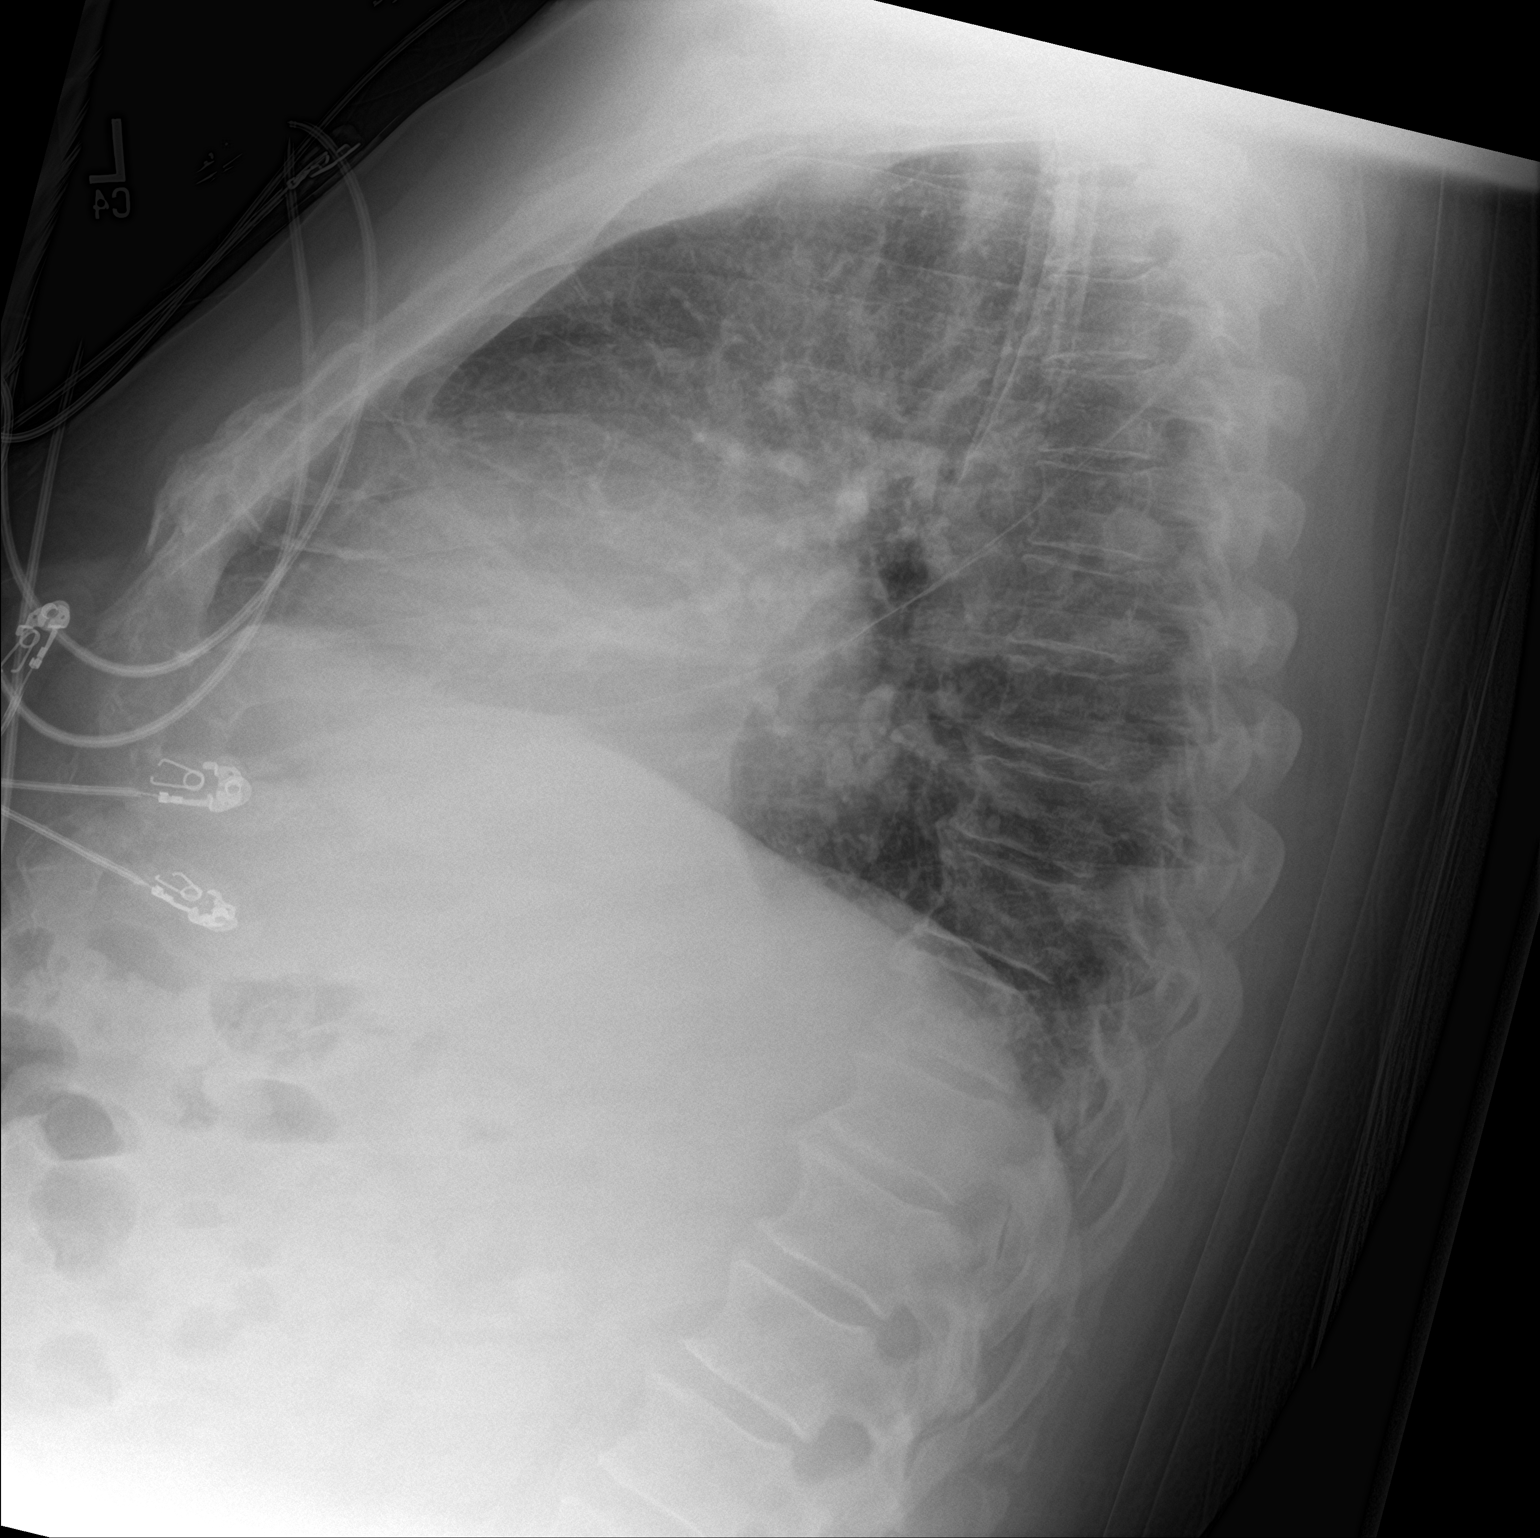

[chest ap]
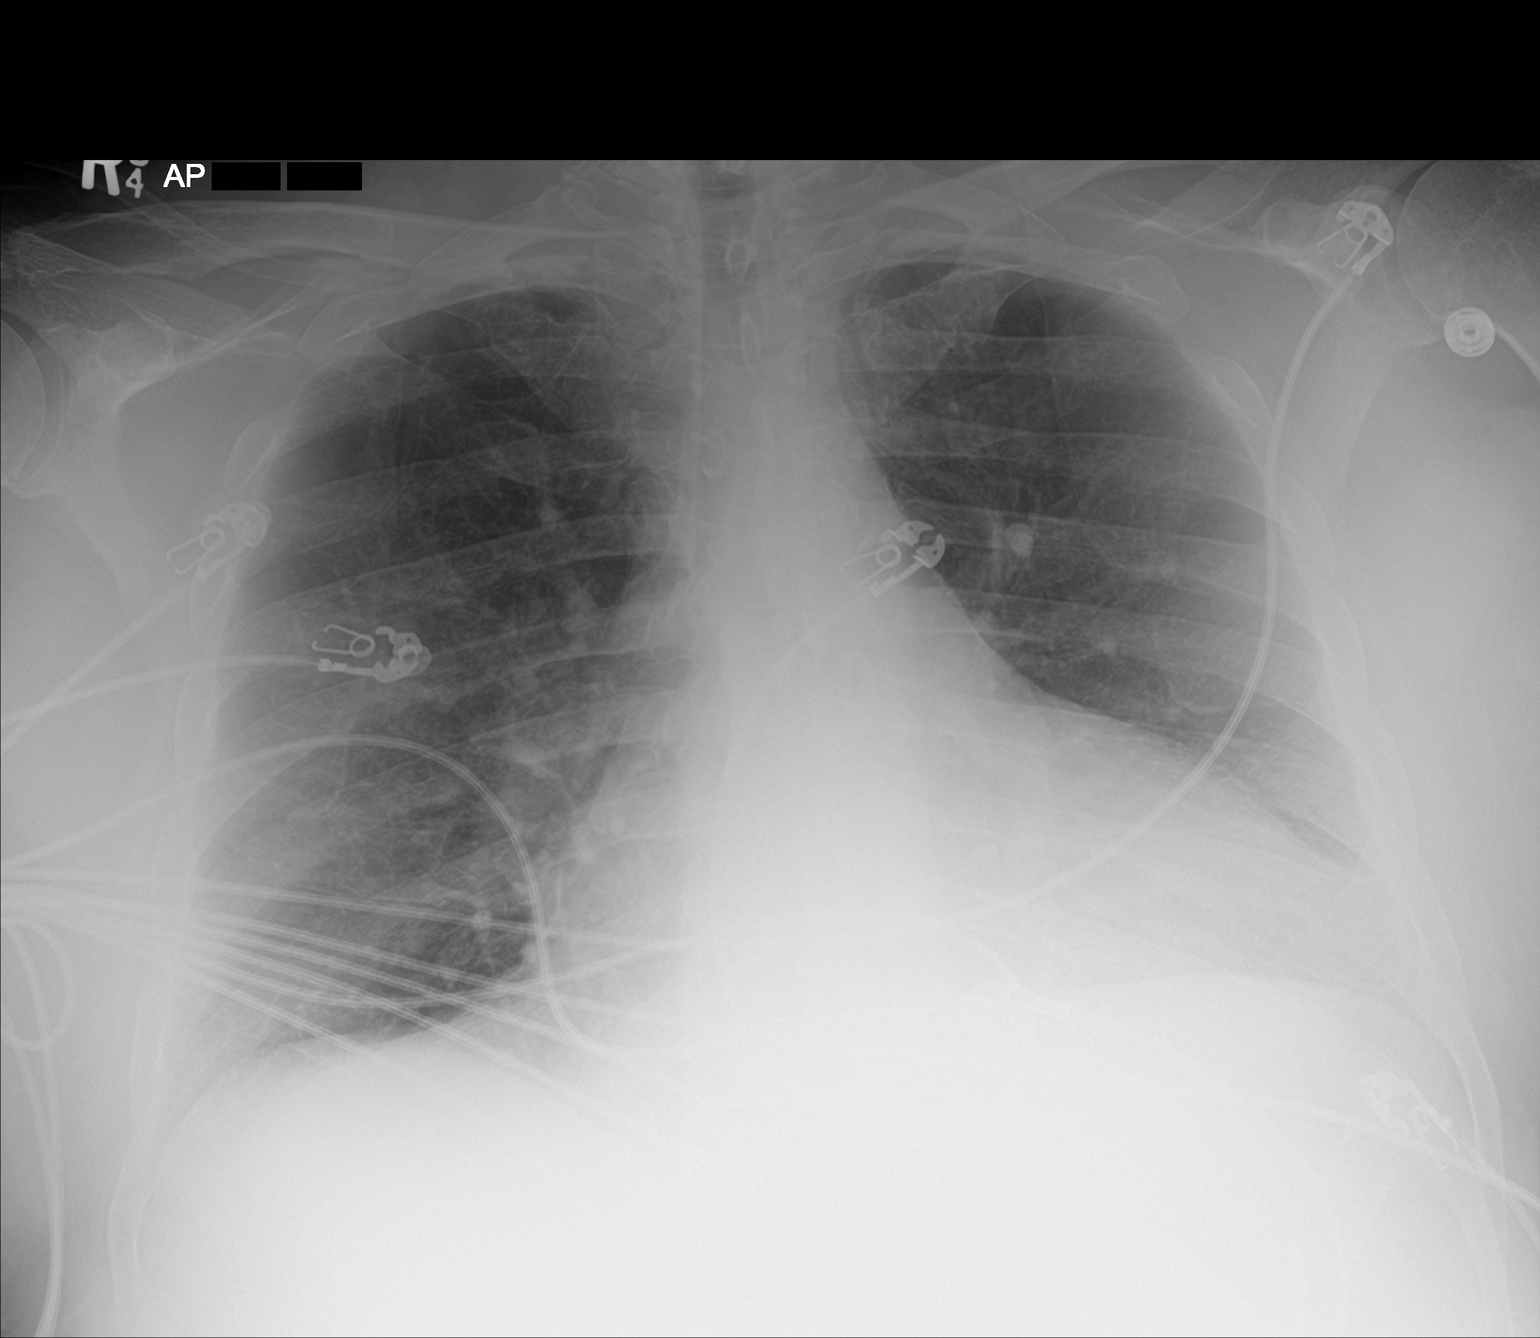

[2 of 2 positions shown; findings below may reference images not displayed]

FINDINGS: Enlargement of the cardiac silhouette is unchanged. The patient has
taken a shallower inspiration than on the prior study. There is
peribronchial thickening. No airspace consolidation, overt edema,
pleural effusion, or pneumothorax is identified. No acute osseous
abnormality is seen.
IMPRESSION: Bronchitic changes.

## 2020-10-11 ENCOUNTER — Telehealth (HOSPITAL_COMMUNITY): Payer: Self-pay

## 2020-10-11 NOTE — Telephone Encounter (Signed)
Pt called to inquire about medication assistance for his symbicort. We did application for him last year and he reports needing it again due to expensive co-pay for him at pharmacy for it.  Will get application printed off and given to him to take to his provider to fax over to company again.   Kerry Hough, EMT-Paramedic  10/11/20

## 2020-10-27 ENCOUNTER — Telehealth (HOSPITAL_COMMUNITY): Payer: Self-pay | Admitting: Internal Medicine

## 2020-10-27 ENCOUNTER — Other Ambulatory Visit (HOSPITAL_COMMUNITY): Payer: Self-pay | Admitting: Internal Medicine

## 2020-11-01 ENCOUNTER — Other Ambulatory Visit (HOSPITAL_COMMUNITY): Payer: Self-pay | Admitting: Internal Medicine

## 2020-11-18 DIAGNOSIS — G4731 Primary central sleep apnea: Secondary | ICD-10-CM | POA: Diagnosis not present

## 2020-11-18 DIAGNOSIS — J45998 Other asthma: Secondary | ICD-10-CM | POA: Diagnosis not present

## 2020-11-24 DIAGNOSIS — E785 Hyperlipidemia, unspecified: Secondary | ICD-10-CM | POA: Diagnosis not present

## 2020-11-24 DIAGNOSIS — I2510 Atherosclerotic heart disease of native coronary artery without angina pectoris: Secondary | ICD-10-CM | POA: Diagnosis not present

## 2020-11-24 DIAGNOSIS — D509 Iron deficiency anemia, unspecified: Secondary | ICD-10-CM | POA: Diagnosis not present

## 2020-11-24 DIAGNOSIS — E1165 Type 2 diabetes mellitus with hyperglycemia: Secondary | ICD-10-CM | POA: Diagnosis not present

## 2020-11-24 DIAGNOSIS — I10 Essential (primary) hypertension: Secondary | ICD-10-CM | POA: Diagnosis not present

## 2020-11-24 DIAGNOSIS — E1159 Type 2 diabetes mellitus with other circulatory complications: Secondary | ICD-10-CM | POA: Diagnosis not present

## 2020-11-24 DIAGNOSIS — G4733 Obstructive sleep apnea (adult) (pediatric): Secondary | ICD-10-CM | POA: Diagnosis not present

## 2020-11-24 DIAGNOSIS — Z0000 Encounter for general adult medical examination without abnormal findings: Secondary | ICD-10-CM | POA: Diagnosis not present

## 2020-11-24 DIAGNOSIS — I272 Pulmonary hypertension, unspecified: Secondary | ICD-10-CM | POA: Diagnosis not present

## 2020-11-24 DIAGNOSIS — I5042 Chronic combined systolic (congestive) and diastolic (congestive) heart failure: Secondary | ICD-10-CM | POA: Diagnosis not present

## 2020-11-28 ENCOUNTER — Encounter (HOSPITAL_BASED_OUTPATIENT_CLINIC_OR_DEPARTMENT_OTHER): Payer: Self-pay

## 2020-11-28 ENCOUNTER — Other Ambulatory Visit: Payer: Self-pay

## 2020-11-28 ENCOUNTER — Emergency Department (HOSPITAL_BASED_OUTPATIENT_CLINIC_OR_DEPARTMENT_OTHER)
Admission: EM | Admit: 2020-11-28 | Discharge: 2020-11-28 | Disposition: A | Discharge status: Home / Self Care | Payer: Medicare Other | Attending: Emergency Medicine | Admitting: Emergency Medicine

## 2020-11-28 DIAGNOSIS — I5043 Acute on chronic combined systolic (congestive) and diastolic (congestive) heart failure: Secondary | ICD-10-CM | POA: Insufficient documentation

## 2020-11-28 DIAGNOSIS — D649 Anemia, unspecified: Secondary | ICD-10-CM | POA: Diagnosis not present

## 2020-11-28 DIAGNOSIS — D6489 Other specified anemias: Secondary | ICD-10-CM | POA: Diagnosis not present

## 2020-11-28 DIAGNOSIS — Z87891 Personal history of nicotine dependence: Secondary | ICD-10-CM | POA: Diagnosis not present

## 2020-11-28 DIAGNOSIS — I10 Essential (primary) hypertension: Secondary | ICD-10-CM | POA: Diagnosis not present

## 2020-11-28 DIAGNOSIS — I110 Hypertensive heart disease with heart failure: Secondary | ICD-10-CM | POA: Diagnosis not present

## 2020-11-28 DIAGNOSIS — J45909 Unspecified asthma, uncomplicated: Secondary | ICD-10-CM | POA: Insufficient documentation

## 2020-11-28 DIAGNOSIS — R5383 Other fatigue: Secondary | ICD-10-CM | POA: Diagnosis present

## 2020-11-28 DIAGNOSIS — J449 Chronic obstructive pulmonary disease, unspecified: Secondary | ICD-10-CM | POA: Diagnosis not present

## 2020-11-28 DIAGNOSIS — I2510 Atherosclerotic heart disease of native coronary artery without angina pectoris: Secondary | ICD-10-CM | POA: Insufficient documentation

## 2020-11-28 LAB — CBC
HCT: 29.7 % — ABNORMAL LOW (ref 39.0–52.0)
Hemoglobin: 7.9 g/dL — ABNORMAL LOW (ref 13.0–17.0)
MCH: 20.2 pg — ABNORMAL LOW (ref 26.0–34.0)
MCHC: 26.6 g/dL — ABNORMAL LOW (ref 30.0–36.0)
MCV: 75.8 fL — ABNORMAL LOW (ref 80.0–100.0)
Platelets: 505 10*3/uL — ABNORMAL HIGH (ref 150–400)
RBC: 3.92 MIL/uL — ABNORMAL LOW (ref 4.22–5.81)
RDW: 17.6 % — ABNORMAL HIGH (ref 11.5–15.5)
WBC: 9 10*3/uL (ref 4.0–10.5)
nRBC: 0.2 % (ref 0.0–0.2)

## 2020-11-28 LAB — COMPREHENSIVE METABOLIC PANEL
ALT: <5 U/L (ref 0–44)
AST: 10 U/L — ABNORMAL LOW (ref 15–41)
Albumin: 4.4 g/dL (ref 3.5–5.0)
Alkaline Phosphatase: 63 U/L (ref 38–126)
Anion gap: 8 (ref 5–15)
BUN: 16 mg/dL (ref 8–23)
CO2: 32 mmol/L (ref 22–32)
Calcium: 9.3 mg/dL (ref 8.9–10.3)
Chloride: 97 mmol/L — ABNORMAL LOW (ref 98–111)
Creatinine, Ser: 1.19 mg/dL (ref 0.61–1.24)
GFR, Estimated: >60 mL/min (ref >60)
Glucose, Bld: 109 mg/dL — ABNORMAL HIGH (ref 70–99)
Potassium: 4.3 mmol/L (ref 3.5–5.1)
Sodium: 137 mmol/L (ref 135–145)
Total Bilirubin: 0.5 mg/dL (ref 0.3–1.2)
Total Protein: 7.5 g/dL (ref 6.5–8.1)

## 2020-11-28 LAB — LIPASE, BLOOD: Lipase: 42 U/L (ref 11–51)

## 2020-11-28 NOTE — ED Provider Notes (Signed)
Emergency Department Provider Note   I have reviewed the triage vital signs and the nursing notes.   HISTORY  Chief Complaint abnormal labs   HPI Kristopher [name] is a 70 y.o. male with past medical history reviewed below including prior episodes of symptomatic anemia requiring blood transfusion presents to the emergency department with report from his doctor of low blood counts.  He was seen at his PCP office recently for a routine physical.  He has been feeling some fatigue recently but nothing severe.  He states overall he is feeling well.  He denies any black or bright red blood in his bowel movements.  He had an admission just over 1 year ago with symptomatic anemia.  He received a unit of blood and had upper and lower endoscopy showing some internal hemorrhoids but no active bleeding.  He has not needed to follow with GI as an outpatient.  He continues to see his primary care doctor who is managing him.  He takes 81 mg aspirin daily but is not anticoagulated.  Incidentally, patient mentions a area of skin irritation to the right ear.  He states been present for several weeks and he has been keeping it clean and dry with dressing changes.  This is improving symptoms significantly.    Past Medical History:  Diagnosis Date   Acute combined systolic and diastolic heart failure (HCC) 08/11/2016   Acute esophagitis 08/17/2016   Acute upper GI bleed 08/10/2016   Asthma    CAD (coronary artery disease) 08/16/2016   CHF (congestive heart failure) (HCC)    COPD (chronic obstructive pulmonary disease) (HCC) 11/06/2019   DCM (dilated cardiomyopathy) (HCC)    Dyslipidemia 11/06/2019   Dyspnea    HTN (hypertension)    LBBB (left bundle branch block) 08/10/2016   Lymphadenopathy 08/17/2016   OSA (obstructive sleep apnea) 11/18/2018   [place] home sleep study revealed Severe Obstructive Sleep Apnea with AHI 65.8/hr. Central sleep apnea was noted with an pAHIc of 11.4/hr. % Cheyne Stokes respirations was  10.3%. Nocturnal Hypoxemia was noted with O2 saturations as low as 72%. Time spent with Oxygen desaturations < 88% was 97.6 minutes.   PUD (peptic ulcer disease) 08/17/2016   Pulmonary HTN (HCC) 08/17/2016    Patient Active Problem List   Diagnosis Date Noted   Symptomatic anemia 11/06/2019   Thrombocytosis 11/06/2019   COPD (chronic obstructive pulmonary disease) (HCC) 11/06/2019   Dyslipidemia 11/06/2019   OSA (obstructive sleep apnea) 11/18/2018   Morbid obesity (HCC) 11/18/2018   Acute on chronic combined systolic and diastolic congestive heart failure, NYHA class 4 (HCC) 07/21/2018   PUD (peptic ulcer disease) 08/17/2016   Acute esophagitis 08/17/2016   Pulmonary HTN (HCC) 08/17/2016   Lymphadenopathy 08/17/2016   CAD (coronary artery disease) 08/16/2016   Anemia    DCM (dilated cardiomyopathy) (HCC)    HTN (hypertension)    Chronic combined systolic and diastolic heart failure (HCC) 08/11/2016   Acute blood loss anemia 08/11/2016   Acute upper GI bleed 08/10/2016   DOE (dyspnea on exertion) 08/10/2016   LBBB (left bundle branch block) 08/10/2016    Past Surgical History:  Procedure Laterality Date   BIOPSY  11/07/2019   Procedure: BIOPSY;  Surgeon: [name], MD;  Location: [facility] ENDOSCOPY;  Service: Gastroenterology;;   BIOPSY  11/08/2019   Procedure: BIOPSY;  Surgeon: [name], MD;  Location: [facility] ENDOSCOPY;  Service: Gastroenterology;;   COLONOSCOPY WITH PROPOFOL N/A 11/08/2019   Procedure: COLONOSCOPY WITH PROPOFOL;  Surgeon:  [name], MD;  Location: [facility] ENDOSCOPY;  Service: Gastroenterology;  Laterality: N/A;   ESOPHAGOGASTRODUODENOSCOPY (EGD) WITH PROPOFOL Left 08/11/2016   Procedure: ESOPHAGOGASTRODUODENOSCOPY (EGD) WITH PROPOFOL;  Surgeon: Willis Modena, MD;  Location: [facility] ENDOSCOPY;  Service: Endoscopy;  Laterality: Left;   ESOPHAGOGASTRODUODENOSCOPY (EGD) WITH PROPOFOL Left 11/07/2019   Procedure: ESOPHAGOGASTRODUODENOSCOPY (EGD) WITH PROPOFOL;   Surgeon: [name], MD;  Location: [facility] ENDOSCOPY;  Service: Gastroenterology;  Laterality: Left;   POLYPECTOMY  11/08/2019   Procedure: POLYPECTOMY;  Surgeon: [name], MD;  Location: [facility] ENDOSCOPY;  Service: Gastroenterology;;   RIGHT/LEFT HEART CATH AND CORONARY ANGIOGRAPHY N/A 08/15/2016   Procedure: Right/Left Heart Cath and Coronary Angiography;  Surgeon: Corky Crafts, MD;  Location: [facility] INVASIVE CV LAB;  Service: Cardiovascular;  Laterality: N/A;   RIGHT/LEFT HEART CATH AND CORONARY ANGIOGRAPHY N/A 08/04/2018   Procedure: RIGHT/LEFT HEART CATH AND CORONARY ANGIOGRAPHY;  Surgeon: Dolores Patty, MD;  Location: [facility] INVASIVE CV LAB;  Service: Cardiovascular;  Laterality: N/A;    Allergies Penicillins  Family History  Problem Relation Age of Onset   Hypertension Mother    Hypertension Sister     Social History Social History   Tobacco Use   Smoking status: Former    Packs/day: 0.50    Years: 44.00    Pack years: 22.00    Types: Cigarettes    Quit date: 06/11/1998    Years since quitting: 22.4   Smokeless tobacco: Never  Vaping Use   Vaping Use: Never used  Substance Use Topics   Alcohol use: Not Currently    Comment: h/o heavy use   Drug use: Not Currently    Review of Systems  Constitutional: No fever/chills. Mild fatigue.  Eyes: No visual changes. ENT: No sore throat. Cardiovascular: Denies chest pain. Respiratory: Denies shortness of breath. Gastrointestinal: No abdominal pain.  No nausea, no vomiting.  No diarrhea.  No constipation. Genitourinary: Negative for dysuria. Musculoskeletal: Negative for back pain. Skin: Rash to the right ear.  Neurological: Negative for headaches, focal weakness or numbness.  10-point ROS otherwise negative.  ____________________________________________   PHYSICAL EXAM:  VITAL SIGNS: ED Triage Vitals  Enc Vitals Group     BP 11/28/20 0914 (!) 156/77     Pulse Rate 11/28/20 0914 85     Resp 11/28/20  0914 14     Temp 11/28/20 0914 98.9 F (37.2 C)     Temp src --      SpO2 11/28/20 0914 94 %     Weight 11/28/20 0915 239 lb (108.4 kg)     Height 11/28/20 0915 5\' 7"  (1.702 m)    Constitutional: Alert and oriented. Well appearing and in no acute distress. Eyes: Conjunctivae are normal.  Head: Atraumatic. Nose: No congestion/rhinnorhea. Mouth/Throat: Mucous membranes are moist.  Oropharynx non-erythematous. Neck: No stridor.  Cardiovascular: Normal rate, regular rhythm. Good peripheral circulation. Grossly normal heart sounds.   Respiratory: Normal respiratory effort.  No retractions. Lungs CTAB. Gastrointestinal: Soft and nontender. No distention.  Musculoskeletal: No lower extremity tenderness nor edema. No gross deformities of extremities. Neurologic:  Normal speech and language. No gross focal neurologic deficits are appreciated.  Skin:  Skin is warm, dry and intact. No rash noted.   ____________________________________________   LABS (all labs ordered are listed, but only abnormal results are displayed)  Labs Reviewed  COMPREHENSIVE METABOLIC PANEL - Abnormal; Notable for the following components:      Result Value   Chloride 97 (*)    Glucose, Bld 109 (*)  AST 10 (*)    All other components within normal limits  CBC - Abnormal; Notable for the following components:   RBC 3.92 (*)    Hemoglobin 7.9 (*)    HCT 29.7 (*)    MCV 75.8 (*)    MCH 20.2 (*)    MCHC 26.6 (*)    RDW 17.6 (*)    Platelets 505 (*)    All other components within normal limits  LIPASE, BLOOD    ____________________________________________   PROCEDURES  Procedure(s) performed:   Procedures  None  ____________________________________________   INITIAL IMPRESSION / ASSESSMENT AND PLAN / ED COURSE  Pertinent labs & imaging results that were available during my care of the patient were reviewed by me and considered in my medical decision making (see chart for details).   Patient  sent to the emergency department from his primary care doctor's office with report of low hemoglobin.  We do not have the lab test from the outpatient setting with [place] today.  The patient is unsure of the results.  Plan to repeat testing here.   09:50 AM  Patient's hemoglobin today is 7.9.  This is the same as his discharge hemoglobin from prior admit.  He is not having significant symptoms.  Do not feel he needs blood transfusion or additional work-up at this time.  Continue to follow with primary care doctor.  They can refer her to GI if necessary but not having subjective symptoms or bleeding. ____________________________________________  FINAL CLINICAL IMPRESSION(S) / ED DIAGNOSES  Final diagnoses:  Chronic anemia    Note:  This document was prepared using Dragon voice recognition software and may include unintentional dictation errors.  Alona Bene, MD, [facility] Emergency Medicine    Annye Forrey, Arlyss Repress, MD 11/28/20 [phone]

## 2020-11-28 NOTE — Discharge Instructions (Addendum)
Your blood counts today show a hemoglobin of 7.9.  This is unchanged from your hospital discharge last year.  Your remaining blood work is also within normal limits.  Please continue to follow with your primary care doctor.  You do not require a blood transfusion for this number.  Please return if you develop severe fatigue, shortness of breath, chest pain, black or bright red blood in your bowel movements.

## 2020-11-28 NOTE — ED Triage Notes (Signed)
Pt arrives with his daughter stating his PCP sent him today due to "low blood count"

## 2020-11-28 NOTE — ED Notes (Signed)
ED Provider at bedside. 

## 2021-02-20 DIAGNOSIS — L819 Disorder of pigmentation, unspecified: Secondary | ICD-10-CM | POA: Diagnosis not present

## 2021-02-22 ENCOUNTER — Encounter (HOSPITAL_COMMUNITY): Payer: Self-pay

## 2021-02-22 ENCOUNTER — Other Ambulatory Visit: Payer: Self-pay

## 2021-02-22 ENCOUNTER — Emergency Department (HOSPITAL_COMMUNITY): Payer: Medicare Other

## 2021-02-22 ENCOUNTER — Inpatient Hospital Stay (HOSPITAL_COMMUNITY)
Admission: EM | Admit: 2021-02-22 | Discharge: 2021-03-11 | DRG: 291 | Disposition: A | Discharge status: Home / Self Care | Payer: Medicare Other | Attending: Internal Medicine | Admitting: Internal Medicine

## 2021-02-22 DIAGNOSIS — J9622 Acute and chronic respiratory failure with hypercapnia: Secondary | ICD-10-CM | POA: Diagnosis not present

## 2021-02-22 DIAGNOSIS — J189 Pneumonia, unspecified organism: Secondary | ICD-10-CM | POA: Diagnosis present

## 2021-02-22 DIAGNOSIS — I472 Ventricular tachycardia, unspecified: Secondary | ICD-10-CM | POA: Diagnosis not present

## 2021-02-22 DIAGNOSIS — K259 Gastric ulcer, unspecified as acute or chronic, without hemorrhage or perforation: Secondary | ICD-10-CM | POA: Diagnosis present

## 2021-02-22 DIAGNOSIS — J441 Chronic obstructive pulmonary disease with (acute) exacerbation: Secondary | ICD-10-CM | POA: Diagnosis present

## 2021-02-22 DIAGNOSIS — Z8619 Personal history of other infectious and parasitic diseases: Secondary | ICD-10-CM | POA: Diagnosis not present

## 2021-02-22 DIAGNOSIS — Z87891 Personal history of nicotine dependence: Secondary | ICD-10-CM

## 2021-02-22 DIAGNOSIS — J9690 Respiratory failure, unspecified, unspecified whether with hypoxia or hypercapnia: Secondary | ICD-10-CM | POA: Diagnosis not present

## 2021-02-22 DIAGNOSIS — K449 Diaphragmatic hernia without obstruction or gangrene: Secondary | ICD-10-CM | POA: Diagnosis present

## 2021-02-22 DIAGNOSIS — J440 Chronic obstructive pulmonary disease with acute lower respiratory infection: Secondary | ICD-10-CM | POA: Diagnosis present

## 2021-02-22 DIAGNOSIS — J45998 Other asthma: Secondary | ICD-10-CM | POA: Diagnosis not present

## 2021-02-22 DIAGNOSIS — I517 Cardiomegaly: Secondary | ICD-10-CM | POA: Diagnosis not present

## 2021-02-22 DIAGNOSIS — R0902 Hypoxemia: Secondary | ICD-10-CM | POA: Diagnosis not present

## 2021-02-22 DIAGNOSIS — D62 Acute posthemorrhagic anemia: Secondary | ICD-10-CM | POA: Diagnosis present

## 2021-02-22 DIAGNOSIS — K2950 Unspecified chronic gastritis without bleeding: Secondary | ICD-10-CM | POA: Diagnosis present

## 2021-02-22 DIAGNOSIS — I5043 Acute on chronic combined systolic (congestive) and diastolic (congestive) heart failure: Secondary | ICD-10-CM | POA: Diagnosis not present

## 2021-02-22 DIAGNOSIS — Z7951 Long term (current) use of inhaled steroids: Secondary | ICD-10-CM

## 2021-02-22 DIAGNOSIS — I2510 Atherosclerotic heart disease of native coronary artery without angina pectoris: Secondary | ICD-10-CM | POA: Diagnosis present

## 2021-02-22 DIAGNOSIS — H9221 Otorrhagia, right ear: Secondary | ICD-10-CM | POA: Diagnosis present

## 2021-02-22 DIAGNOSIS — I420 Dilated cardiomyopathy: Secondary | ICD-10-CM | POA: Diagnosis present

## 2021-02-22 DIAGNOSIS — I5042 Chronic combined systolic (congestive) and diastolic (congestive) heart failure: Secondary | ICD-10-CM | POA: Diagnosis not present

## 2021-02-22 DIAGNOSIS — E119 Type 2 diabetes mellitus without complications: Secondary | ICD-10-CM | POA: Diagnosis present

## 2021-02-22 DIAGNOSIS — D649 Anemia, unspecified: Secondary | ICD-10-CM | POA: Diagnosis not present

## 2021-02-22 DIAGNOSIS — J449 Chronic obstructive pulmonary disease, unspecified: Secondary | ICD-10-CM | POA: Diagnosis present

## 2021-02-22 DIAGNOSIS — E662 Morbid (severe) obesity with alveolar hypoventilation: Secondary | ICD-10-CM | POA: Diagnosis not present

## 2021-02-22 DIAGNOSIS — Z20822 Contact with and (suspected) exposure to covid-19: Secondary | ICD-10-CM | POA: Diagnosis not present

## 2021-02-22 DIAGNOSIS — G4733 Obstructive sleep apnea (adult) (pediatric): Secondary | ICD-10-CM | POA: Diagnosis not present

## 2021-02-22 DIAGNOSIS — R6889 Other general symptoms and signs: Secondary | ICD-10-CM | POA: Diagnosis not present

## 2021-02-22 DIAGNOSIS — D509 Iron deficiency anemia, unspecified: Secondary | ICD-10-CM | POA: Diagnosis present

## 2021-02-22 DIAGNOSIS — R0603 Acute respiratory distress: Secondary | ICD-10-CM | POA: Diagnosis not present

## 2021-02-22 DIAGNOSIS — I272 Pulmonary hypertension, unspecified: Secondary | ICD-10-CM | POA: Diagnosis present

## 2021-02-22 DIAGNOSIS — J9621 Acute and chronic respiratory failure with hypoxia: Secondary | ICD-10-CM | POA: Diagnosis not present

## 2021-02-22 DIAGNOSIS — K219 Gastro-esophageal reflux disease without esophagitis: Secondary | ICD-10-CM | POA: Diagnosis present

## 2021-02-22 DIAGNOSIS — Z6838 Body mass index (BMI) 38.0-38.9, adult: Secondary | ICD-10-CM | POA: Diagnosis not present

## 2021-02-22 DIAGNOSIS — I110 Hypertensive heart disease with heart failure: Secondary | ICD-10-CM | POA: Diagnosis not present

## 2021-02-22 DIAGNOSIS — Z8711 Personal history of peptic ulcer disease: Secondary | ICD-10-CM

## 2021-02-22 DIAGNOSIS — E785 Hyperlipidemia, unspecified: Secondary | ICD-10-CM | POA: Diagnosis present

## 2021-02-22 DIAGNOSIS — G4731 Primary central sleep apnea: Secondary | ICD-10-CM | POA: Diagnosis not present

## 2021-02-22 DIAGNOSIS — R531 Weakness: Secondary | ICD-10-CM | POA: Diagnosis not present

## 2021-02-22 DIAGNOSIS — N179 Acute kidney failure, unspecified: Secondary | ICD-10-CM | POA: Diagnosis not present

## 2021-02-22 DIAGNOSIS — B9681 Helicobacter pylori [H. pylori] as the cause of diseases classified elsewhere: Secondary | ICD-10-CM | POA: Diagnosis not present

## 2021-02-22 DIAGNOSIS — J811 Chronic pulmonary edema: Secondary | ICD-10-CM | POA: Diagnosis not present

## 2021-02-22 DIAGNOSIS — Z91198 Patient's noncompliance with other medical treatment and regimen for other reason: Secondary | ICD-10-CM

## 2021-02-22 DIAGNOSIS — I441 Atrioventricular block, second degree: Secondary | ICD-10-CM | POA: Diagnosis not present

## 2021-02-22 DIAGNOSIS — J9601 Acute respiratory failure with hypoxia: Secondary | ICD-10-CM | POA: Diagnosis not present

## 2021-02-22 DIAGNOSIS — K2970 Gastritis, unspecified, without bleeding: Secondary | ICD-10-CM | POA: Diagnosis not present

## 2021-02-22 DIAGNOSIS — Z8719 Personal history of other diseases of the digestive system: Secondary | ICD-10-CM

## 2021-02-22 DIAGNOSIS — Z8249 Family history of ischemic heart disease and other diseases of the circulatory system: Secondary | ICD-10-CM

## 2021-02-22 DIAGNOSIS — I5022 Chronic systolic (congestive) heart failure: Secondary | ICD-10-CM

## 2021-02-22 DIAGNOSIS — Z880 Allergy status to penicillin: Secondary | ICD-10-CM

## 2021-02-22 DIAGNOSIS — K279 Peptic ulcer, site unspecified, unspecified as acute or chronic, without hemorrhage or perforation: Secondary | ICD-10-CM | POA: Diagnosis not present

## 2021-02-22 DIAGNOSIS — I447 Left bundle-branch block, unspecified: Secondary | ICD-10-CM | POA: Diagnosis present

## 2021-02-22 DIAGNOSIS — J9600 Acute respiratory failure, unspecified whether with hypoxia or hypercapnia: Secondary | ICD-10-CM

## 2021-02-22 DIAGNOSIS — R0602 Shortness of breath: Secondary | ICD-10-CM | POA: Diagnosis not present

## 2021-02-22 DIAGNOSIS — J9602 Acute respiratory failure with hypercapnia: Secondary | ICD-10-CM | POA: Diagnosis not present

## 2021-02-22 DIAGNOSIS — K2289 Other specified disease of esophagus: Secondary | ICD-10-CM | POA: Diagnosis not present

## 2021-02-22 DIAGNOSIS — E875 Hyperkalemia: Secondary | ICD-10-CM | POA: Diagnosis not present

## 2021-02-22 DIAGNOSIS — Z7982 Long term (current) use of aspirin: Secondary | ICD-10-CM

## 2021-02-22 DIAGNOSIS — Z79899 Other long term (current) drug therapy: Secondary | ICD-10-CM

## 2021-02-22 DIAGNOSIS — J90 Pleural effusion, not elsewhere classified: Secondary | ICD-10-CM | POA: Diagnosis not present

## 2021-02-22 DIAGNOSIS — Z743 Need for continuous supervision: Secondary | ICD-10-CM | POA: Diagnosis not present

## 2021-02-22 LAB — URINALYSIS, COMPLETE (UACMP) WITH MICROSCOPIC
Bilirubin Urine: NEGATIVE
Glucose, UA: NEGATIVE mg/dL
Hgb urine dipstick: NEGATIVE
Ketones, ur: NEGATIVE mg/dL
Leukocytes,Ua: NEGATIVE
Nitrite: NEGATIVE
Protein, ur: NEGATIVE mg/dL
Specific Gravity, Urine: 1.012 (ref 1.005–1.030)
pH: 5 (ref 5.0–8.0)

## 2021-02-22 LAB — I-STAT VENOUS BLOOD GAS, ED
Acid-Base Excess: 7 mmol/L — ABNORMAL HIGH (ref 0.0–2.0)
Bicarbonate: 35.6 mmol/L — ABNORMAL HIGH (ref 20.0–28.0)
Calcium, Ion: 1.12 mmol/L — ABNORMAL LOW (ref 1.15–1.40)
HCT: 26 % — ABNORMAL LOW (ref 39.0–52.0)
Hemoglobin: 8.8 g/dL — ABNORMAL LOW (ref 13.0–17.0)
O2 Saturation: 99 %
Potassium: 4.7 mmol/L (ref 3.5–5.1)
Sodium: 137 mmol/L (ref 135–145)
TCO2: 38 mmol/L — ABNORMAL HIGH (ref 22–32)
pCO2, Ven: 77.5 mmHg (ref 44.0–60.0)
pH, Ven: 7.27 (ref 7.250–7.430)
pO2, Ven: 172 mmHg — ABNORMAL HIGH (ref 32.0–45.0)

## 2021-02-22 LAB — COMPREHENSIVE METABOLIC PANEL
ALT: 6 U/L (ref 0–44)
AST: 12 U/L — ABNORMAL LOW (ref 15–41)
Albumin: 3.7 g/dL (ref 3.5–5.0)
Alkaline Phosphatase: 55 U/L (ref 38–126)
Anion gap: 11 (ref 5–15)
BUN: 30 mg/dL — ABNORMAL HIGH (ref 8–23)
CO2: 29 mmol/L (ref 22–32)
Calcium: 8.7 mg/dL — ABNORMAL LOW (ref 8.9–10.3)
Chloride: 97 mmol/L — ABNORMAL LOW (ref 98–111)
Creatinine, Ser: 2.23 mg/dL — ABNORMAL HIGH (ref 0.61–1.24)
GFR, Estimated: 31 mL/min — ABNORMAL LOW (ref >60)
Glucose, Bld: 99 mg/dL (ref 70–99)
Potassium: 4.7 mmol/L (ref 3.5–5.1)
Sodium: 137 mmol/L (ref 135–145)
Total Bilirubin: 0.6 mg/dL (ref 0.3–1.2)
Total Protein: 6.9 g/dL (ref 6.5–8.1)

## 2021-02-22 LAB — CBC WITH DIFFERENTIAL/PLATELET
Abs Immature Granulocytes: 0.09 10*3/uL — ABNORMAL HIGH (ref 0.00–0.07)
Basophils Absolute: 0.1 10*3/uL (ref 0.0–0.1)
Basophils Relative: 1 %
Eosinophils Absolute: 0.1 10*3/uL (ref 0.0–0.5)
Eosinophils Relative: 1 %
HCT: 26.3 % — ABNORMAL LOW (ref 39.0–52.0)
Hemoglobin: 6.4 g/dL — CL (ref 13.0–17.0)
Immature Granulocytes: 1 %
Lymphocytes Relative: 11 %
Lymphs Abs: 1.6 10*3/uL (ref 0.7–4.0)
MCH: 18.3 pg — ABNORMAL LOW (ref 26.0–34.0)
MCHC: 24.3 g/dL — ABNORMAL LOW (ref 30.0–36.0)
MCV: 75.4 fL — ABNORMAL LOW (ref 80.0–100.0)
Monocytes Absolute: 1 10*3/uL (ref 0.1–1.0)
Monocytes Relative: 7 %
Neutro Abs: 12.1 10*3/uL — ABNORMAL HIGH (ref 1.7–7.7)
Neutrophils Relative %: 79 %
Platelets: 675 10*3/uL — ABNORMAL HIGH (ref 150–400)
RBC: 3.49 MIL/uL — ABNORMAL LOW (ref 4.22–5.81)
RDW: 18.9 % — ABNORMAL HIGH (ref 11.5–15.5)
WBC: 15 10*3/uL — ABNORMAL HIGH (ref 4.0–10.5)
nRBC: 1.4 % — ABNORMAL HIGH (ref 0.0–0.2)

## 2021-02-22 LAB — RESP PANEL BY RT-PCR (FLU A&B, COVID) ARPGX2
Influenza A by PCR: NEGATIVE
Influenza B by PCR: NEGATIVE
SARS Coronavirus 2 by RT PCR: NEGATIVE

## 2021-02-22 LAB — PREPARE RBC (CROSSMATCH)

## 2021-02-22 LAB — BRAIN NATRIURETIC PEPTIDE: B Natriuretic Peptide: 480 pg/mL — ABNORMAL HIGH (ref 0.0–100.0)

## 2021-02-22 LAB — GLUCOSE, CAPILLARY: Glucose-Capillary: 111 mg/dL — ABNORMAL HIGH (ref 70–99)

## 2021-02-22 LAB — TROPONIN I (HIGH SENSITIVITY)
Troponin I (High Sensitivity): 14 ng/L (ref <18)
Troponin I (High Sensitivity): 10 ng/L (ref <18)

## 2021-02-22 MED ORDER — ARFORMOTEROL TARTRATE 15 MCG/2ML IN NEBU
15.0000 ug | INHALATION_SOLUTION | Freq: Two times a day (BID) | RESPIRATORY_TRACT | Status: DC
  Administered 2021-02-22 – 2021-03-11 (×28): 15 ug via RESPIRATORY_TRACT
  Filled 2021-02-22 (×34): qty 2

## 2021-02-22 MED ORDER — SODIUM CHLORIDE 0.9 % IV SOLN
10.0000 mL/h | Freq: Once | INTRAVENOUS | Status: AC
  Administered 2021-02-22: 10 mL/h via INTRAVENOUS

## 2021-02-22 MED ORDER — ALBUTEROL SULFATE (2.5 MG/3ML) 0.083% IN NEBU
2.5000 mg | INHALATION_SOLUTION | RESPIRATORY_TRACT | Status: DC | PRN
  Administered 2021-02-26: 2.5 mg via RESPIRATORY_TRACT
  Filled 2021-02-22: qty 3

## 2021-02-22 MED ORDER — SODIUM CHLORIDE 0.9% IV SOLUTION: Freq: Once | INTRAVENOUS | Status: DC

## 2021-02-22 MED ORDER — ALBUTEROL SULFATE HFA 108 (90 BASE) MCG/ACT IN AERS: 2.0000 | INHALATION_SPRAY | RESPIRATORY_TRACT | Status: DC | PRN

## 2021-02-22 MED ORDER — METHYLPREDNISOLONE SODIUM SUCC 125 MG IJ SOLR
125.0000 mg | Freq: Once | INTRAMUSCULAR | Status: AC
Start: 2021-02-22 — End: 2021-02-22
  Administered 2021-02-22: 125 mg via INTRAVENOUS
  Filled 2021-02-22: qty 2

## 2021-02-22 MED ORDER — SODIUM CHLORIDE 0.9 % IV SOLN
2.0000 g | Freq: Three times a day (TID) | INTRAVENOUS | Status: DC
  Administered 2021-02-23 – 2021-02-24 (×5): 2 g via INTRAVENOUS
  Filled 2021-02-22 (×5): qty 2

## 2021-02-22 MED ORDER — IPRATROPIUM-ALBUTEROL 0.5-2.5 (3) MG/3ML IN SOLN
3.0000 mL | Freq: Once | RESPIRATORY_TRACT | Status: AC
  Administered 2021-02-22: 3 mL via RESPIRATORY_TRACT
  Filled 2021-02-22: qty 3

## 2021-02-22 MED ORDER — BUDESONIDE 0.5 MG/2ML IN SUSP
0.5000 mg | Freq: Two times a day (BID) | RESPIRATORY_TRACT | Status: DC
  Administered 2021-02-23 – 2021-03-11 (×31): 0.5 mg via RESPIRATORY_TRACT
  Filled 2021-02-22 (×32): qty 2

## 2021-02-22 MED ORDER — POLYETHYLENE GLYCOL 3350 17 G PO PACK
17.0000 g | PACK | Freq: Every day | ORAL | Status: DC | PRN
  Filled 2021-02-22: qty 1

## 2021-02-22 MED ORDER — DOCUSATE SODIUM 100 MG PO CAPS: 100.0000 mg | ORAL_CAPSULE | Freq: Two times a day (BID) | ORAL | Status: DC | PRN

## 2021-02-22 MED ORDER — PANTOPRAZOLE SODIUM 40 MG IV SOLR
40.0000 mg | Freq: Every day | INTRAVENOUS | Status: DC
  Administered 2021-02-22 – 2021-02-23 (×2): 40 mg via INTRAVENOUS
  Filled 2021-02-22 (×2): qty 40

## 2021-02-22 NOTE — ED Notes (Signed)
VBG results to Dr. Rush Landmark

## 2021-02-22 NOTE — Progress Notes (Signed)
Pharmacy Antibiotic Note  Kristopher [name] is a 70 y.o. male admitted on 02/22/2021 with possible community-acquired pneumonia or sepsis with a PCN allergy.  Pharmacy has been consulted for aztreonam dosing.  WBC 15, afebrile SCr 2.23 - baseline ~1-1.2  Plan: Aztreonam 2g every 8 hours Monitor renal function, CBC, cultures/sensitivities, LOT and de-escalate as able  Temp (24hrs), Avg:98.5 F (36.9 C), Min:98.2 F (36.8 C), Max:98.9 F (37.2 C)  Recent Labs  Lab 02/22/21 1727  WBC 15.0*  CREATININE 2.23*    Estimated Creatinine Clearance: 37.2 mL/min (A) (by C-G formula based on SCr of 2.23 mg/dL (H)).    Allergies  Allergen Reactions   Penicillins     Pt reports it makes him feel shaky Has patient had a PCN reaction causing immediate rash, facial/tongue/throat swelling, SOB or lightheadedness with hypotension: YES Has patient had a PCN reaction causing severe rash involving mucus membranes or skin necrosis: NO Has patient had a PCN reaction that required hospitalization NO Has patient had a PCN reaction occurring within the last 10 years: NO If all of the above answers are "NO", then may proceed with Cephalosporin use.    Antimicrobials this admission: Aztreonam 9/14 >>  Dose adjustments this admission: None - but watch renal function   Microbiology results: none  Thank you for allowing pharmacy to be a part of this patient's care.  Filbert Schilder, PharmD PGY1 Pharmacy Resident 02/22/2021  9:19 PM  Please check [email or web address] for unit-specific pharmacy phone numbers.

## 2021-02-22 NOTE — H&P (Signed)
NAME:  Kristopher [name], MRN:  [id number], DOB:  21-Jun-1950, LOS: 0 ADMISSION DATE:  02/22/2021, CONSULTATION DATE:  02/22/2021  REFERRING MD:  Tegeler, EDP, CHIEF COMPLAINT:  hypotension, resp distres on bipap   History of Present Illness:  70 year old with chronic systolic CHF, nonischemic cardiomyopathy and pulmonary hypertension brought in by EMS with shortness of breath for 3 weeks, complains of cough with white sputum production.  Noted to be hypoxic to 76% and initially improved with 3 L oxygen but later on became somnolent in the ED and placed on BiPAP for VBG of 7.2 7/78/172.  ED evaluation showed mild leukocytosis of 15 K, drop in hemoglobin from 7.9 in June to 6.4 ,Slight high BNP of 480 and rise of creatinine from 1.2-2.2 Mental status improved with BiPAP but he was also mildly hypotensive, due to multiple critical issues, PCCM consulted for admission. Chest x-ray showed cardiomegaly with mild interstitial prominence and small effusions He reports compliance with his CPAP which brought down a few months ago  Pertinent  Medical History  HFrEF, NICM EF 30-35% , declined ICD , LBBB PH Severe OSA on CPAP ? COPD quit smoking 20 years ago  Significant Hospital Events: Including procedures, antibiotic start and stop dates in addition to other pertinent events   10/2019 EGD negative, colonoscopy showed hemorrhoids and polyps  Interim History / Subjective:    Objective   Blood pressure (!) 90/47, pulse 71, temperature 98.9 F (37.2 C), temperature source Oral, resp. rate (!) 21, height 5\' 7"  (1.702 m), weight 111.1 kg, SpO2 100 %.    FiO2 (%):  [40 %] 40 %  No intake or output data in the 24 hours ending 02/22/21 2044 Filed Weights   02/22/21 1727  Weight: 111.1 kg    Examination: General: Chronically ill-appearing, on BiPAP full facemask, no distress HENT: No JVD, no lymphadenopathy, mild pallor, no icterus Lungs: Barrel chest, fullness supraclavicular, decreased breath sounds  bilateral, no rhonchi Cardiovascular: S1-S2 regular Abdomen: Soft, obese, nontender Extremities: No edema, no deformity Neuro: Awake, follows one-step commands, nonfocal, no asterixis  EKG independently reviewed shows IVCD, progressive increase in PR interval with dropped QRS suggesting Wenckebach  Resolved Hospital Problem list     Assessment & Plan:  Multiple issues ongoing without unifying diagnosis -he has acute hypercarbic respiratory failure with AKI does not appear to be in obvious fluid overload, mildly hypertensive, with drop in hemoglobin to 6.4 without obvious GI bleed.  He reports compliance with his diuretics and is also on Entresto.  He has mild leukocytosis and pneumonia cannot be excluded  Acute hypercarbic respiratory failure -continue BiPAP, 14/6, mental status is improved -He can be taken off in the daytime and converted to CPAP nightly when clinically improved -Given leukocytosis, will treat empirically with aztreonam for community-acquired pneumonia, trend procalcitonin, severe penicillin allergy documented -We will substitute Pulmicort and Brovana nebs for underlying COPD  Hypotension -possible sepsis, obtain lactate Will avoid fluids for now since blood will be administered  AKI -hold Entresto and spironolactone Obtain Fina for completion, appears dry, will hold Lasix given hypotension Follow-up BMET  Chronic systolic heart failure  Wenckebach's -Hold meds as above, will hold Coreg until heart rate improves. Consult cardiology   Anemia -Hemoccult stool , previous EGD colonoscopy was nondiagnostic in 2021 Transfuse 1 unit PRBC Will avoid subcutaneous heparin until convinced that there is no GI bleed  Will admit to ICU if stable by 9/15, can transfer to triad service  Best Practice (right click and "  Reselect all SmartList Selections" daily)   Diet/type: full liquids  DVT prophylaxis: SCD GI prophylaxis: PPI Lines: N/A Foley:  N/A Code Status:  full  code Last date of multidisciplinary goals of care discussion [NA]  Labs   CBC: Recent Labs  Lab 02/22/21 1727 02/22/21 1915  WBC 15.0*  --   NEUTROABS 12.1*  --   HGB 6.4* 8.8*  HCT 26.3* 26.0*  MCV 75.4*  --   PLT 675*  --     Basic Metabolic Panel: Recent Labs  Lab 02/22/21 1727 02/22/21 1915  NA 137 137  K 4.7 4.7  CL 97*  --   CO2 29  --   GLUCOSE 99  --   BUN 30*  --   CREATININE 2.23*  --   CALCIUM 8.7*  --    GFR: Estimated Creatinine Clearance: 37.2 mL/min (A) (by C-G formula based on SCr of 2.23 mg/dL (H)). Recent Labs  Lab 02/22/21 1727  WBC 15.0*    Liver Function Tests: Recent Labs  Lab 02/22/21 1727  AST 12*  ALT 6  ALKPHOS 55  BILITOT 0.6  PROT 6.9  ALBUMIN 3.7   No results for input(s): LIPASE, AMYLASE in the last 168 hours. No results for input(s): AMMONIA in the last 168 hours.  ABG    Component Value Date/Time   PHART 7.316 (L) 08/06/2018 1835   PCO2ART 77.9 (HH) 08/06/2018 1835   PO2ART 85.4 08/06/2018 1835   HCO3 35.6 (H) 02/22/2021 1915   TCO2 38 (H) 02/22/2021 1915   O2SAT 99.0 02/22/2021 1915     Coagulation Profile: No results for input(s): INR, PROTIME in the last 168 hours.  Cardiac Enzymes: No results for input(s): CKTOTAL, CKMB, CKMBINDEX, TROPONINI in the last 168 hours.  HbA1C: Hgb A1c MFr Bld  Date/Time Value Ref Range Status  07/22/2018 09:55 AM 6.0 (H) 4.8 - 5.6 % Final    Comment:    (NOTE) Pre diabetes:          5.7%-6.4% Diabetes:              >6.4% Glycemic control for   <7.0% adults with diabetes     CBG: No results for input(s): GLUCAP in the last 168 hours.  Review of Systems:   Limited since he is on BiPAP  Shortness of breath Cough with white sputum Somnolence noted in the ED No hemoptysis or wheezing No hematemesis or melena Abdominal pain, nausea, vomiting   Past Medical History:  He,  has a past medical history of Acute combined systolic and diastolic heart failure (HCC)  (08/11/2016), Acute esophagitis (08/17/2016), Acute upper GI bleed (08/10/2016), Asthma, CAD (coronary artery disease) (08/16/2016), CHF (congestive heart failure) (HCC), COPD (chronic obstructive pulmonary disease) (HCC) (11/06/2019), DCM (dilated cardiomyopathy) (HCC), Dyslipidemia (11/06/2019), Dyspnea, HTN (hypertension), LBBB (left bundle branch block) (08/10/2016), Lymphadenopathy (08/17/2016), OSA (obstructive sleep apnea) (11/18/2018), PUD (peptic ulcer disease) (08/17/2016), and Pulmonary HTN (HCC) (08/17/2016).   Surgical History:   Past Surgical History:  Procedure Laterality Date   BIOPSY  11/07/2019   Procedure: BIOPSY;  Surgeon: [name], MD;  Location: [facility] ENDOSCOPY;  Service: Gastroenterology;;   BIOPSY  11/08/2019   Procedure: BIOPSY;  Surgeon: [name], MD;  Location: [facility] ENDOSCOPY;  Service: Gastroenterology;;   COLONOSCOPY WITH PROPOFOL N/A 11/08/2019   Procedure: COLONOSCOPY WITH PROPOFOL;  Surgeon: [name], MD;  Location: [facility] ENDOSCOPY;  Service: Gastroenterology;  Laterality: N/A;   ESOPHAGOGASTRODUODENOSCOPY (EGD) WITH PROPOFOL Left 08/11/2016   Procedure: ESOPHAGOGASTRODUODENOSCOPY (EGD) WITH  PROPOFOL;  Surgeon: Willis Modena, MD;  Location: [facility] ENDOSCOPY;  Service: Endoscopy;  Laterality: Left;   ESOPHAGOGASTRODUODENOSCOPY (EGD) WITH PROPOFOL Left 11/07/2019   Procedure: ESOPHAGOGASTRODUODENOSCOPY (EGD) WITH PROPOFOL;  Surgeon: [name], MD;  Location: [facility] ENDOSCOPY;  Service: Gastroenterology;  Laterality: Left;   POLYPECTOMY  11/08/2019   Procedure: POLYPECTOMY;  Surgeon: [name], MD;  Location: [facility] ENDOSCOPY;  Service: Gastroenterology;;   RIGHT/LEFT HEART CATH AND CORONARY ANGIOGRAPHY N/A 08/15/2016   Procedure: Right/Left Heart Cath and Coronary Angiography;  Surgeon: Corky Crafts, MD;  Location: [facility] INVASIVE CV LAB;  Service: Cardiovascular;  Laterality: N/A;   RIGHT/LEFT HEART CATH AND CORONARY ANGIOGRAPHY N/A 08/04/2018   Procedure: RIGHT/LEFT  HEART CATH AND CORONARY ANGIOGRAPHY;  Surgeon: Dolores Patty, MD;  Location: [facility] INVASIVE CV LAB;  Service: Cardiovascular;  Laterality: N/A;     Social History:   reports that he quit smoking about 22 years ago. His smoking use included cigarettes. He has a 22.00 pack-year smoking history. He has never used smokeless tobacco. He reports that he does not currently use alcohol. He reports that he does not currently use drugs.   Family History:  His family history includes Hypertension in his mother and sister.   Allergies Allergies  Allergen Reactions   Penicillins     Pt reports it makes him feel shaky Has patient had a PCN reaction causing immediate rash, facial/tongue/throat swelling, SOB or lightheadedness with hypotension: YES Has patient had a PCN reaction causing severe rash involving mucus membranes or skin necrosis: NO Has patient had a PCN reaction that required hospitalization NO Has patient had a PCN reaction occurring within the last 10 years: NO If all of the above answers are "NO", then may proceed with Cephalosporin use.     Home Medications  Prior to Admission medications   Medication Sig Start Date End Date Taking? Authorizing Provider  acetaminophen (TYLENOL) 325 MG tablet Take 650 mg by mouth every 6 (six) hours as needed for mild pain.     [provider]  albuterol (PROVENTIL) (2.5 MG/3ML) 0.083% nebulizer solution Inhale 3 mLs into the lungs every 6 (six) hours as needed for wheezing or shortness of breath. 06/20/18   [provider]  aspirin 81 MG chewable tablet Chew 1 tablet (81 mg total) by mouth daily. 09/15/18   Alford Highland, NP  atorvastatin (LIPITOR) 40 MG tablet Take 1 tablet (40 mg total) by mouth daily at 6 PM. Needs appt for further refills 11/01/20   Bensimhon, Bevelyn Buckles, MD  carvedilol (COREG) 6.25 MG tablet Take 1.5 tablets (9.375 mg total) by mouth 2 (two) times daily with a meal for 30 days. 12/04/18 11/06/19  Bensimhon, Bevelyn Buckles,  MD  ferrous sulfate 325 (65 FE) MG tablet Take 325 mg by mouth daily with breakfast.    [provider]  furosemide (LASIX) 40 MG tablet Take 2 tablets (80 mg total) by mouth 2 (two) times daily. 02/11/20   Bensimhon, Bevelyn Buckles, MD  ipratropium (ATROVENT) 0.02 % nebulizer solution Take 2.5 mLs (0.5 mg total) by nebulization 2 (two) times daily. 08/08/18   Zigmund Daniel., MD  omeprazole (PRILOSEC) 40 MG capsule Take 40 mg by mouth daily as needed (gerd).     [provider]  potassium chloride (KLOR-CON) 10 MEQ tablet Take 1 tablet (10 mEq total) by mouth daily. 06/29/19   Bensimhon, Bevelyn Buckles, MD  potassium chloride (KLOR-CON) 10 MEQ tablet TAKE 1 TABLET BY MOUTH ONCE DAILY. NEEDS  APPOINTMENT FOR FURTHER REFILLS 10/28/20   Bensimhon, Bevelyn Buckles, MD  sacubitril-valsartan (ENTRESTO) 97-103 MG Take 1 tablet by mouth 2 (two) times daily. 01/20/20   Bensimhon, Bevelyn Buckles, MD  spironolactone (ALDACTONE) 25 MG tablet Take 1 tablet (25 mg total) by mouth daily. 12/15/19   Bensimhon, Bevelyn Buckles, MD  SYMBICORT 160-4.5 MCG/ACT inhaler Inhale 1 puff into the lungs in the morning and at bedtime. 09/08/19   [provider]     Critical care time: 56 m    Cyril Mourning MD. FCCP. [facility] Pager : [phone]  If no response to pager , please call [phone] until 7 pm After 7:00 pm call Elink  [phone]   02/22/2021

## 2021-02-22 NOTE — Progress Notes (Signed)
Rt transported patient from ED to 2M02 without event.

## 2021-02-22 NOTE — Plan of Care (Signed)

## 2021-02-22 NOTE — ED Provider Notes (Signed)
[facility] EMERGENCY DEPARTMENT Provider Note   CSN: [id number] Arrival date & time: 02/22/21  1719     History Chief Complaint  Patient presents with   Shortness of Breath    Kristopher [name] is a 70 y.o. male.  With past medical history of COPD, CHF with last EF 25-30%, CAD, asthma, hypertension, dilated cardiomyopathy, obstructive sleep apnea, pulmonary hypertension who presents to the emergency department with complaints of shortness of breath.  States that his shortness of breath initially started about 3 weeks ago without inciting factor.  He states that it has been worsening particularly over the past 3 to 4 days.  States that he is usually able to walk about 35 minutes but has been unable to walk more than 10 minutes over the past few days which prompted his presentation to the ED. On EMS arrival, pt O2 sat 76% on room air. Was placed on 3LNC with improvement in symptoms. Denies use of home O2. Additionally, he endorses increased sputum production 1-2 weeks ago, but states this has improved. Endorses orthopnea, PND. Endorses increased use of PRN inhalers. Denies fevers, chest pain, palpitations, abdominal pain, lightheadedness, lower extremity swelling.   States that he usually wears CPAP at night; however, states machine broke a few months ago and has not been able to wear it. Endorses taking all other medications including inhalers as prescribed.    Shortness of Breath Associated symptoms: cough and wheezing   Associated symptoms: no abdominal pain, no chest pain, no fever and no vomiting    Past Medical History:  Diagnosis Date   Acute combined systolic and diastolic heart failure (HCC) 08/11/2016   Acute esophagitis 08/17/2016   Acute upper GI bleed 08/10/2016   Asthma    CAD (coronary artery disease) 08/16/2016   CHF (congestive heart failure) (HCC)    COPD (chronic obstructive pulmonary disease) (HCC) 11/06/2019   DCM (dilated cardiomyopathy) (HCC)     Dyslipidemia 11/06/2019   Dyspnea    HTN (hypertension)    LBBB (left bundle branch block) 08/10/2016   Lymphadenopathy 08/17/2016   OSA (obstructive sleep apnea) 11/18/2018   [place] home sleep study revealed Severe Obstructive Sleep Apnea with AHI 65.8/hr. Central sleep apnea was noted with an pAHIc of 11.4/hr. % Cheyne Stokes respirations was 10.3%. Nocturnal Hypoxemia was noted with O2 saturations as low as 72%. Time spent with Oxygen desaturations < 88% was 97.6 minutes.   PUD (peptic ulcer disease) 08/17/2016   Pulmonary HTN (HCC) 08/17/2016    Patient Active Problem List   Diagnosis Date Noted   Symptomatic anemia 11/06/2019   Thrombocytosis 11/06/2019   COPD (chronic obstructive pulmonary disease) (HCC) 11/06/2019   Dyslipidemia 11/06/2019   OSA (obstructive sleep apnea) 11/18/2018   Morbid obesity (HCC) 11/18/2018   Acute on chronic combined systolic and diastolic congestive heart failure, NYHA class 4 (HCC) 07/21/2018   PUD (peptic ulcer disease) 08/17/2016   Acute esophagitis 08/17/2016   Pulmonary HTN (HCC) 08/17/2016   Lymphadenopathy 08/17/2016   CAD (coronary artery disease) 08/16/2016   Anemia    DCM (dilated cardiomyopathy) (HCC)    HTN (hypertension)    Chronic combined systolic and diastolic heart failure (HCC) 08/11/2016   Acute blood loss anemia 08/11/2016   Acute upper GI bleed 08/10/2016   DOE (dyspnea on exertion) 08/10/2016   LBBB (left bundle branch block) 08/10/2016    Past Surgical History:  Procedure Laterality Date   BIOPSY  11/07/2019   Procedure: BIOPSY;  Surgeon: [name],  MD;  Location: [facility] ENDOSCOPY;  Service: Gastroenterology;;   BIOPSY  11/08/2019   Procedure: BIOPSY;  Surgeon: [name], MD;  Location: [facility] ENDOSCOPY;  Service: Gastroenterology;;   COLONOSCOPY WITH PROPOFOL N/A 11/08/2019   Procedure: COLONOSCOPY WITH PROPOFOL;  Surgeon: [name], MD;  Location: [facility] ENDOSCOPY;  Service: Gastroenterology;  Laterality: N/A;    ESOPHAGOGASTRODUODENOSCOPY (EGD) WITH PROPOFOL Left 08/11/2016   Procedure: ESOPHAGOGASTRODUODENOSCOPY (EGD) WITH PROPOFOL;  Surgeon: Willis Modena, MD;  Location: [facility] ENDOSCOPY;  Service: Endoscopy;  Laterality: Left;   ESOPHAGOGASTRODUODENOSCOPY (EGD) WITH PROPOFOL Left 11/07/2019   Procedure: ESOPHAGOGASTRODUODENOSCOPY (EGD) WITH PROPOFOL;  Surgeon: [name], MD;  Location: [facility] ENDOSCOPY;  Service: Gastroenterology;  Laterality: Left;   POLYPECTOMY  11/08/2019   Procedure: POLYPECTOMY;  Surgeon: [name], MD;  Location: [facility] ENDOSCOPY;  Service: Gastroenterology;;   RIGHT/LEFT HEART CATH AND CORONARY ANGIOGRAPHY N/A 08/15/2016   Procedure: Right/Left Heart Cath and Coronary Angiography;  Surgeon: Corky Crafts, MD;  Location: [facility] INVASIVE CV LAB;  Service: Cardiovascular;  Laterality: N/A;   RIGHT/LEFT HEART CATH AND CORONARY ANGIOGRAPHY N/A 08/04/2018   Procedure: RIGHT/LEFT HEART CATH AND CORONARY ANGIOGRAPHY;  Surgeon: Dolores Patty, MD;  Location: [facility] INVASIVE CV LAB;  Service: Cardiovascular;  Laterality: N/A;    Family History  Problem Relation Age of Onset   Hypertension Mother    Hypertension Sister    Social History   Tobacco Use   Smoking status: Former    Packs/day: 0.50    Years: 44.00    Pack years: 22.00    Types: Cigarettes    Quit date: 06/11/1998    Years since quitting: 22.7   Smokeless tobacco: Never  Vaping Use   Vaping Use: Never used  Substance Use Topics   Alcohol use: Not Currently    Comment: h/o heavy use   Drug use: Not Currently   Home Medications Prior to Admission medications   Medication Sig Start Date End Date Taking? Authorizing Provider  acetaminophen (TYLENOL) 325 MG tablet Take 650 mg by mouth every 6 (six) hours as needed for mild pain.     [provider]  albuterol (PROVENTIL) (2.5 MG/3ML) 0.083% nebulizer solution Inhale 3 mLs into the lungs every 6 (six) hours as needed for wheezing or shortness of breath.  06/20/18   [provider]  aspirin 81 MG chewable tablet Chew 1 tablet (81 mg total) by mouth daily. 09/15/18   Alford Highland, NP  atorvastatin (LIPITOR) 40 MG tablet Take 1 tablet (40 mg total) by mouth daily at 6 PM. Needs appt for further refills 11/01/20   Bensimhon, Bevelyn Buckles, MD  carvedilol (COREG) 6.25 MG tablet Take 1.5 tablets (9.375 mg total) by mouth 2 (two) times daily with a meal for 30 days. 12/04/18 11/06/19  Bensimhon, Bevelyn Buckles, MD  ferrous sulfate 325 (65 FE) MG tablet Take 325 mg by mouth daily with breakfast.    [provider]  furosemide (LASIX) 40 MG tablet Take 2 tablets (80 mg total) by mouth 2 (two) times daily. 02/11/20   Bensimhon, Bevelyn Buckles, MD  ipratropium (ATROVENT) 0.02 % nebulizer solution Take 2.5 mLs (0.5 mg total) by nebulization 2 (two) times daily. 08/08/18   Zigmund Daniel., MD  omeprazole (PRILOSEC) 40 MG capsule Take 40 mg by mouth daily as needed (gerd).     [provider]  potassium chloride (KLOR-CON) 10 MEQ tablet Take 1 tablet (10 mEq total) by mouth daily. 06/29/19   Bensimhon, Bevelyn Buckles, MD  potassium chloride (KLOR-CON) 10 MEQ tablet TAKE 1 TABLET BY MOUTH ONCE DAILY. NEEDS APPOINTMENT FOR FURTHER REFILLS 10/28/20   Bensimhon, Bevelyn Buckles, MD  sacubitril-valsartan (ENTRESTO) 97-103 MG Take 1 tablet by mouth 2 (two) times daily. 01/20/20   Bensimhon, Bevelyn Buckles, MD  spironolactone (ALDACTONE) 25 MG tablet Take 1 tablet (25 mg total) by mouth daily. 12/15/19   Bensimhon, Bevelyn Buckles, MD  SYMBICORT 160-4.5 MCG/ACT inhaler Inhale 1 puff into the lungs in the morning and at bedtime. 09/08/19   [provider]    Allergies    Penicillins  Review of Systems   Review of Systems  Constitutional:  Positive for activity change and fatigue. Negative for fever.  Respiratory:  Positive for cough, shortness of breath and wheezing.   Cardiovascular:  Negative for chest pain, palpitations and leg swelling.  Gastrointestinal:  Negative for  abdominal pain, diarrhea, nausea and vomiting.  All other systems reviewed and are negative.  Physical Exam Updated Vital Signs BP (!) 94/43 (BP Location: Right Arm)   Pulse 76   Temp 98.9 F (37.2 C) (Oral)   Resp (!) 33   Ht 5\' 7"  (1.702 m)   Wt 111.1 kg   SpO2 100%   BMI 38.37 kg/m   Physical Exam Vitals and nursing note reviewed.  Constitutional:      General: He is not in acute distress.    Appearance: Normal appearance. He is well-developed. He is not toxic-appearing.  HENT:     Head: Normocephalic and atraumatic.     Nose: No congestion.     Mouth/Throat:     Mouth: Mucous membranes are moist.     Pharynx: Oropharynx is clear.  Eyes:     General: No scleral icterus.    Extraocular Movements: Extraocular movements intact.     Pupils: Pupils are equal, round, and reactive to light.  Neck:     Vascular: No JVD.  Cardiovascular:     Rate and Rhythm: Normal rate and regular rhythm. Extrasystoles are present.    Pulses: Normal pulses.     Heart sounds: Normal heart sounds.  Pulmonary:     Effort: Tachypnea present.     Breath sounds: Examination of the right-upper field reveals wheezing. Examination of the right-middle field reveals wheezing. Examination of the left-middle field reveals wheezing. Examination of the right-lower field reveals rales. Examination of the left-lower field reveals rales. Decreased breath sounds, wheezing and rales present.  Chest:     Chest wall: No tenderness.  Abdominal:     General: Bowel sounds are normal.     Palpations: Abdomen is soft.  Musculoskeletal:     Cervical back: Normal range of motion and neck supple.     Right lower leg: No edema.     Left lower leg: No edema.  Skin:    General: Skin is warm and dry.     Capillary Refill: Capillary refill takes less than 2 seconds.  Neurological:     General: No focal deficit present.     Mental Status: He is alert and oriented to person, place, and time.  Psychiatric:        Mood  and Affect: Mood normal.        Behavior: Behavior normal.    ED Results / Procedures / Treatments   Labs (all labs ordered are listed, but only abnormal results are displayed) Labs Reviewed  CBC WITH DIFFERENTIAL/PLATELET - Abnormal; Notable for the following components:      Result Value  WBC 15.0 (*)    RBC 3.49 (*)    Hemoglobin 6.4 (*)    HCT 26.3 (*)    MCV 75.4 (*)    MCH 18.3 (*)    MCHC 24.3 (*)    RDW 18.9 (*)    Platelets 675 (*)    nRBC 1.4 (*)    Neutro Abs 12.1 (*)    Abs Immature Granulocytes 0.09 (*)    All other components within normal limits  COMPREHENSIVE METABOLIC PANEL - Abnormal; Notable for the following components:   Chloride 97 (*)    BUN 30 (*)    Creatinine, Ser 2.23 (*)    Calcium 8.7 (*)    AST 12 (*)    GFR, Estimated 31 (*)    All other components within normal limits  BRAIN NATRIURETIC PEPTIDE - Abnormal; Notable for the following components:   B Natriuretic Peptide 480.0 (*)    All other components within normal limits  I-STAT VENOUS BLOOD GAS, ED - Abnormal; Notable for the following components:   pCO2, Ven 77.5 (*)    pO2, Ven 172.0 (*)    Bicarbonate 35.6 (*)    TCO2 38 (*)    Acid-Base Excess 7.0 (*)    Calcium, Ion 1.12 (*)    HCT 26.0 (*)    Hemoglobin 8.8 (*)    All other components within normal limits  RESP PANEL BY RT-PCR (FLU A&B, COVID) ARPGX2  POC OCCULT BLOOD, ED  TYPE AND SCREEN  PREPARE RBC (CROSSMATCH)  TROPONIN I (HIGH SENSITIVITY)  TROPONIN I (HIGH SENSITIVITY)    EKG EKG Interpretation  Date/Time:  Wednesday February 22 2021 17:35:09 EDT Ventricular Rate:  74 PR Interval:  199 QRS Duration: 145 QT Interval:  434 QTC Calculation: 482 R Axis:   -85 Text Interpretation: Sinus rhythm Nonspecific IVCD with LAD Consider anterior infarct When compared to prior, similar appearance. No STEMI Confirmed by Theda Belfast ([id number]) on 02/22/2021 5:38:06 PM  Radiology DG Chest 2 View  Result Date:  02/22/2021 CLINICAL DATA:  Shortness of breath. EXAM: CHEST - 2 VIEW COMPARISON:  Chest x-ray 07/30/2018. FINDINGS: The heart is enlarged. There is central pulmonary vascular congestion. There is no focal lung infiltrate, pleural effusion or pneumothorax. There are minimal atelectatic changes in the lung bases. No acute fractures are seen. Degenerative changes affect the spine. IMPRESSION: Cardiomegaly with central pulmonary vascular congestion. Electronically Signed   By: Darliss Cheney M.D.   On: 02/22/2021 17:55    Procedures .Critical Care Performed by: Cristopher Peru, PA-C Authorized by: Cristopher Peru, PA-C   Critical care provider statement:    Critical care time (minutes):  45   Critical care was necessary to treat or prevent imminent or life-threatening deterioration of the following conditions:  Respiratory failure   Critical care was time spent personally by me on the following activities:  Discussions with consultants, evaluation of patient's response to treatment, examination of patient, ordering and performing treatments and interventions, ordering and review of laboratory studies, ordering and review of radiographic studies, pulse oximetry, re-evaluation of patient's condition, obtaining history from patient or surrogate and review of old charts   Care discussed with: admitting provider     Medications Ordered in ED Medications  albuterol (VENTOLIN HFA) 108 (90 Base) MCG/ACT inhaler 2 puff (has no administration in time range)  0.9 %  sodium chloride infusion (has no administration in time range)  ipratropium-albuterol (DUONEB) 0.5-2.5 (3) MG/3ML nebulizer solution 3 mL (3 mLs Nebulization Given  02/22/21 1941)  methylPREDNISolone sodium succinate (SOLU-MEDROL) 125 mg/2 mL injection 125 mg (125 mg Intravenous Given 02/22/21 1951)    ED Course  I have reviewed the triage vital signs and the nursing notes.  Pertinent labs & imaging results that were available during my care of the  patient were reviewed by me and considered in my medical decision making (see chart for details).  MDM Rules/Calculators/A&P Marjorie Lussier is a 70 year old male with PMH, HPI and PE as described above.  On initial exam he is alert and oriented, although with increased work of breathing.  On 3 L nasal cannula up from home requirement of room air.  Moving air although he does have wheezing and rails. Initial differential broad to include COPD exacerbation, acute on chronic heart failure, ACS, acute PE, pneumothorax, pneumonia. Chest x-ray without signs of pneumothorax. Presentations not consistent with acute PE.  After labs, imaging, patient's presentation is multifactorial.  SOB - appears to be a component of acute on chronic heart failure as BNP is in the 400s, Rales bilaterally, decreased endurance.  Although he appears perfused without lower extremity edema. Patient also has pHTN, asthma and COPD without previous oxygen requirement.  -Given duoneb initially with improvement in wheezing.  -Initial venous gas with CO2 of 77.5.  Likely related to his COPD and no access to CPAP. Placed on [facility] by EMS.  -Will not diurese given low BP. Placed on BiPAP   Symptomatic anemia Hemoglobin 6.4 on CBC, appears baseline is around 8.  Denies blood in stool, refused hemoccult testing May be component of shortness of breath.  Transfusing 1 unit.  AKI Scr 2.23 - elevated from previous around 1.1 Likely pre-renal with MAPs 60-61, possible acute blood loss anemia   1930: Went to reassess patient and he is lethargic.  He is also slightly agitated. Likely from [facility] and decreased respiratory drive, retaining CO2. Called respiratory to place patient on BiPAP. Will reassess.  Refusing Hemoccult testing.  Spoke with Critical care, Dr. Cyril Mourning who will admit the patient  Final Clinical Impression(s) / ED Diagnoses Final diagnoses:  Acute on chronic respiratory failure with hypercapnia (HCC)  COPD  exacerbation (HCC)  Symptomatic anemia    Rx / DC Orders ED Discharge Orders     None        Cristopher Peru, PA-C 02/22/21 2104    Tegeler, Canary Brim, MD 02/25/21 [id number]

## 2021-02-22 NOTE — Progress Notes (Signed)
eLink Physician-Brief Progress Note Patient Name: Kristopher [name] DOB: [birth year] MRN: [id number]   Date of Service  02/22/2021  HPI/Events of Note  Patient admitted with acute on chronic hypoxic / hypercapnic respiratory failure, hypotension, suspected acute blood loss anemia, r/o pneumonia, occurring against a background of known HFrEF, CHF history, OSA on home night-time CPAP (not compliant recently).  eICU Interventions  New Patient Evaluation.        Thomasene Lot Benno Brensinger 02/22/2021, 11:16 PM

## 2021-02-22 NOTE — ED Notes (Signed)
Attempted to call report to 2M. RN unable to take report at this time.  

## 2021-02-22 NOTE — ED Triage Notes (Signed)
SOB for 3 weeks intermittent last 3--4 days steady decline in SOB today was the worst and called EMS.  Pt now states after 3L and albuterol says he is great.

## 2021-02-23 ENCOUNTER — Inpatient Hospital Stay (HOSPITAL_COMMUNITY): Payer: Medicare Other

## 2021-02-23 DIAGNOSIS — R0603 Acute respiratory distress: Secondary | ICD-10-CM | POA: Diagnosis not present

## 2021-02-23 DIAGNOSIS — J9622 Acute and chronic respiratory failure with hypercapnia: Secondary | ICD-10-CM | POA: Diagnosis not present

## 2021-02-23 DIAGNOSIS — I5022 Chronic systolic (congestive) heart failure: Secondary | ICD-10-CM | POA: Diagnosis not present

## 2021-02-23 LAB — POCT I-STAT 7, (LYTES, BLD GAS, ICA,H+H)
Acid-Base Excess: 6 mmol/L — ABNORMAL HIGH (ref 0.0–2.0)
Acid-Base Excess: 7 mmol/L — ABNORMAL HIGH (ref 0.0–2.0)
Bicarbonate: 33.8 mmol/L — ABNORMAL HIGH (ref 20.0–28.0)
Bicarbonate: 34 mmol/L — ABNORMAL HIGH (ref 20.0–28.0)
Calcium, Ion: 1.16 mmol/L (ref 1.15–1.40)
Calcium, Ion: 1.17 mmol/L (ref 1.15–1.40)
HCT: 27 % — ABNORMAL LOW (ref 39.0–52.0)
HCT: 27 % — ABNORMAL LOW (ref 39.0–52.0)
Hemoglobin: 9.2 g/dL — ABNORMAL LOW (ref 13.0–17.0)
Hemoglobin: 9.2 g/dL — ABNORMAL LOW (ref 13.0–17.0)
O2 Saturation: 95 %
O2 Saturation: 95 %
Patient temperature: 98.3
Potassium: 5.3 mmol/L — ABNORMAL HIGH (ref 3.5–5.1)
Potassium: 5.1 mmol/L (ref 3.5–5.1)
Sodium: 137 mmol/L (ref 135–145)
Sodium: 137 mmol/L (ref 135–145)
TCO2: 36 mmol/L — ABNORMAL HIGH (ref 22–32)
TCO2: 36 mmol/L — ABNORMAL HIGH (ref 22–32)
pCO2 arterial: 72.3 mmHg (ref 32.0–48.0)
pCO2 arterial: 65.2 mmHg (ref 32.0–48.0)
pH, Arterial: 7.277 — ABNORMAL LOW (ref 7.350–7.450)
pH, Arterial: 7.325 — ABNORMAL LOW (ref 7.350–7.450)
pO2, Arterial: 88 mmHg (ref 83.0–108.0)
pO2, Arterial: 82 mmHg — ABNORMAL LOW (ref 83.0–108.0)

## 2021-02-23 LAB — CBC
HCT: 29.2 % — ABNORMAL LOW (ref 39.0–52.0)
Hemoglobin: 7.4 g/dL — ABNORMAL LOW (ref 13.0–17.0)
MCH: 19.5 pg — ABNORMAL LOW (ref 26.0–34.0)
MCHC: 25.3 g/dL — ABNORMAL LOW (ref 30.0–36.0)
MCV: 76.8 fL — ABNORMAL LOW (ref 80.0–100.0)
Platelets: 619 10*3/uL — ABNORMAL HIGH (ref 150–400)
RBC: 3.8 MIL/uL — ABNORMAL LOW (ref 4.22–5.81)
RDW: 19.2 % — ABNORMAL HIGH (ref 11.5–15.5)
WBC: 15.4 10*3/uL — ABNORMAL HIGH (ref 4.0–10.5)
nRBC: 0.9 % — ABNORMAL HIGH (ref 0.0–0.2)

## 2021-02-23 LAB — BASIC METABOLIC PANEL
Anion gap: 10 (ref 5–15)
BUN: 32 mg/dL — ABNORMAL HIGH (ref 8–23)
CO2: 31 mmol/L (ref 22–32)
Calcium: 8.7 mg/dL — ABNORMAL LOW (ref 8.9–10.3)
Chloride: 97 mmol/L — ABNORMAL LOW (ref 98–111)
Creatinine, Ser: 2.05 mg/dL — ABNORMAL HIGH (ref 0.61–1.24)
GFR, Estimated: 34 mL/min — ABNORMAL LOW (ref >60)
Glucose, Bld: 136 mg/dL — ABNORMAL HIGH (ref 70–99)
Potassium: 5.5 mmol/L — ABNORMAL HIGH (ref 3.5–5.1)
Sodium: 138 mmol/L (ref 135–145)

## 2021-02-23 LAB — GLUCOSE, CAPILLARY
Glucose-Capillary: 106 mg/dL — ABNORMAL HIGH (ref 70–99)
Glucose-Capillary: 122 mg/dL — ABNORMAL HIGH (ref 70–99)
Glucose-Capillary: 126 mg/dL — ABNORMAL HIGH (ref 70–99)
Glucose-Capillary: 150 mg/dL — ABNORMAL HIGH (ref 70–99)

## 2021-02-23 LAB — ECHOCARDIOGRAM COMPLETE
Area-P 1/2: 3.08 cm2
Height: 67 in
S' Lateral: 3.1 cm
Weight: 3781.33 oz

## 2021-02-23 LAB — PHOSPHORUS: Phosphorus: 7.1 mg/dL — ABNORMAL HIGH (ref 2.5–4.6)

## 2021-02-23 LAB — MRSA NEXT GEN BY PCR, NASAL: MRSA by PCR Next Gen: NOT DETECTED

## 2021-02-23 LAB — LACTIC ACID, PLASMA: Lactic Acid, Venous: 0.8 mmol/L (ref 0.5–1.9)

## 2021-02-23 LAB — HIV ANTIBODY (ROUTINE TESTING W REFLEX): HIV Screen 4th Generation wRfx: NONREACTIVE

## 2021-02-23 LAB — MAGNESIUM: Magnesium: 2.7 mg/dL — ABNORMAL HIGH (ref 1.7–2.4)

## 2021-02-23 LAB — PROCALCITONIN: Procalcitonin: 0.37 ng/mL

## 2021-02-23 MED ORDER — PERFLUTREN LIPID MICROSPHERE
1.0000 mL | INTRAVENOUS | Status: AC | PRN
  Administered 2021-02-23: 3 mL via INTRAVENOUS
  Filled 2021-02-23: qty 10

## 2021-02-23 MED ORDER — LINEZOLID 600 MG/300ML IV SOLN
600.0000 mg | Freq: Two times a day (BID) | INTRAVENOUS | Status: DC
  Administered 2021-02-23 – 2021-02-24 (×3): 600 mg via INTRAVENOUS
  Filled 2021-02-23 (×3): qty 300

## 2021-02-23 MED ORDER — SODIUM ZIRCONIUM CYCLOSILICATE 5 G PO PACK
5.0000 g | PACK | Freq: Once | ORAL | Status: AC
  Administered 2021-02-23: 5 g via ORAL
  Filled 2021-02-23: qty 1

## 2021-02-23 MED ORDER — CALCIUM ACETATE (PHOS BINDER) 667 MG PO CAPS
667.0000 mg | ORAL_CAPSULE | Freq: Three times a day (TID) | ORAL | Status: DC
  Administered 2021-02-23 – 2021-02-24 (×3): 667 mg via ORAL
  Filled 2021-02-23 (×6): qty 1

## 2021-02-23 MED ORDER — CHLORHEXIDINE GLUCONATE CLOTH 2 % EX PADS
6.0000 | MEDICATED_PAD | Freq: Every day | CUTANEOUS | Status: DC
Start: 2021-02-23 — End: 2021-03-05
  Administered 2021-02-22 – 2021-03-03 (×5): 6 via TOPICAL

## 2021-02-23 NOTE — Progress Notes (Signed)
  Echocardiogram 2D Echocardiogram has been performed.  Kristopher [name] 02/23/2021, 1:49 PM

## 2021-02-23 NOTE — Progress Notes (Addendum)
eLink Physician-Brief Progress Note Patient Name: Kristopher [name] DOB: 1951/01/21 MRN: [id number]   Date of Service  02/23/2021  HPI/Events of Note  Patient episodically bradycardic into the 30's while asleep, blood pressure intact. K+ 5.5, Phos 7.1, creatinine 2.05.  eICU Interventions  Precedex discontinued, Lokelma 5 gm x 1 when patient is off BIPAP, Phoslo ordered for when patient is off BIPAP.        Kristopher [name] 02/23/2021, 6:04 AM

## 2021-02-23 NOTE — Consult Note (Addendum)
Advanced Heart Failure Team Consult Note   Primary Physician: Shirlean Mylar, MD PCP-Cardiologist:  Armanda Magic, MD HF MD: Dr Gala Romney   Reason for Consultation:  Shock HPI:    Kristopher [name] is seen today for evaluation of shock at the request of Dr Isaiah Serge.   Kristopher [name] is a 70 year old with a history of obesity, HTN, DM2, COPD, chronic combined diastolic/systolic CHF, severe OSA (AHI 65) and NICM.   He has been followed in the HF clinic and was last seen 10/05/2019 . EF had been persistently < 35%. ICD discussed and he adamantly refused. Stable at that appointment with plans for 6 month follow up. Says he has moved a few times and has not returned to the clinic.   10/2019 EGD negative, Colonoscopy with hemorrhoids, polpys, and diverticulosis.   2 months ago CPAP machine stopped working and he has been unable to pay for a new machine. Taking all HF meds but misses afternoon dose of lasix. Has had small amount of bleeding from right ear.   Presented to ED via EMS with respiratory distress. Worsening dyspnea over the last few weeks.  Oxygen saturations 76% on nasal cannula. Placed on Bipap. ABG CO 77.5, pO2 172, bicarb 35.6 , ph 7.3. CXR with vascular congestion and cardiomegaly. Blood Cx obtained. Hypotensive with elevated WBC. Treated for possible sepsis. Placed on empiric antibiotics. Pertinent admission labs: Sar 2 negative, Hgb 6.4, WBC 15,  K 4.7, creatinine 2.24, BNP 480, HS Trop 14>10.  Given 1uPRBCs. Hgb increased to 8.8>7.4.   Todays lactic acid 0.8 and pro calcitonin 0.37. Remain on Bipap. BB held due to 2nd degree heart block.    Echo ordered   Feeling better today and wants to eat.   Previous Cardiac Studies  Echo 11/2018 EF 25-30% RV mildly reduced.   Cath 07/2018 Prox LAD lesion is 30% stenosed. Prox RCA to Mid RCA lesion is 30% stenosed. Prox Cx lesion is 30% stenosed.  Findings:  Ao = 107/69 (87)  LV =  119/13 RA =  5 RV = 64/11 PA = 71/23 (39) PCW = 21 Fick  cardiac output/index = 5.6/2.6 PVR = 3.1 WU Ao sat = 94% PA sat = 64%, 65%  Assessment: 1. Minimal non-obstructive CAD 2. NICM EF 30-35% 3. Moderate pulmonary HTN due primarily to OSA/OHS Review of Systems: [y] = yes, [ ]  = no   General: Weight gain [ ] ; Weight loss [ ] ; Anorexia [ ] ; Fatigue [ Y]; Fever [ ] ; Chills [ ] ; Weakness [ Y]  Cardiac: Chest pain/pressure [ ] ; Resting SOB [ Y]; Exertional SOB [ Y]; Orthopnea [ ] ; Pedal Edema [ ] ; Palpitations [ ] ; Syncope [ ] ; Presyncope [ ] ; Paroxysmal nocturnal dyspnea[ ]   Pulmonary: Cough [ ] ; Wheezing[ ] ; Hemoptysis[ ] ; Sputum [ ] ; Snoring [ ]   GI: Vomiting[ ] ; Dysphagia[ ] ; Melena[ ] ; Hematochezia [ ] ; Heartburn[ ] ; Abdominal pain [ ] ; Constipation [ ] ; Diarrhea [ ] ; BRBPR [ ]   GU: Hematuria[ ] ; Dysuria [ ] ; Nocturia[ ]   Vascular: Pain in legs with walking [ ] ; Pain in feet with lying flat [ ] ; Non-healing sores [ ] ; Stroke [ ] ; TIA [ ] ; Slurred speech [ ] ;  Neuro: Headaches[ ] ; Vertigo[ ] ; Seizures[ ] ; Paresthesias[ ] ;Blurred vision [ ] ; Diplopia [ ] ; Vision changes [ ]   Ortho/Skin: Arthritis [ ] ; Joint pain [Y ]; Muscle pain [ ] ; Joint swelling [ ] ; Back Pain [ ] ; Rash [ ]   Psych: Depression[ ] ;  Anxiety[ ]   Heme: Bleeding problems [ ] ; Clotting disorders [ ] ; Anemia [Y ]  Endocrine: Diabetes [Y ]; Thyroid dysfunction[ ]   Home Medications Prior to Admission medications   Medication Sig Start Date End Date Taking? Authorizing Provider  acetaminophen (TYLENOL) 325 MG tablet Take 650 mg by mouth every 6 (six) hours as needed for mild pain.    Yes [provider]  albuterol (PROVENTIL) (2.5 MG/3ML) 0.083% nebulizer solution Inhale 3 mLs into the lungs in the morning and at bedtime. 06/20/18  Yes [provider]  aspirin 81 MG chewable tablet Chew 1 tablet (81 mg total) by mouth daily. 09/15/18  Yes 08/19/18, NP  atorvastatin (LIPITOR) 40 MG tablet Take 1 tablet (40 mg total) by mouth daily at 6 PM. Needs appt for further  refills 11/01/20  Yes Khalie Wince, Alford Highland, MD  carvedilol (COREG) 6.25 MG tablet Take 1.5 tablets (9.375 mg total) by mouth 2 (two) times daily with a meal for 30 days. 12/04/18 02/22/21 Yes Azure Budnick, 12/06/18, MD  ferrous sulfate 325 (65 FE) MG tablet Take 325 mg by mouth daily with breakfast.   Yes [provider]  furosemide (LASIX) 40 MG tablet Take 2 tablets (80 mg total) by mouth 2 (two) times daily. 02/11/20  Yes Sarayu Prevost, Bevelyn Buckles, MD  omeprazole (PRILOSEC) 40 MG capsule Take 40 mg by mouth daily as needed (gerd).    Yes [provider]  potassium chloride (KLOR-CON) 10 MEQ tablet Take 1 tablet (10 mEq total) by mouth daily. 06/29/19  Yes Amish Mintzer, Bevelyn Buckles, MD  sacubitril-valsartan (ENTRESTO) 97-103 MG Take 1 tablet by mouth 2 (two) times daily. 01/20/20  Yes Stokes Rattigan, Bevelyn Buckles, MD  spironolactone (ALDACTONE) 25 MG tablet Take 1 tablet (25 mg total) by mouth daily. 12/15/19  Yes Miracle Mongillo, Bevelyn Buckles, MD  SYMBICORT 160-4.5 MCG/ACT inhaler Inhale 1 puff into the lungs in the morning and at bedtime. 09/08/19  Yes [provider]  ipratropium (ATROVENT) 0.02 % nebulizer solution Take 2.5 mLs (0.5 mg total) by nebulization 2 (two) times daily. Patient not taking: Reported on 02/22/2021 08/08/18   02/24/2021., MD    Past Medical History: Past Medical History:  Diagnosis Date   Acute combined systolic and diastolic heart failure (HCC) 08/11/2016   Acute esophagitis 08/17/2016   Acute upper GI bleed 08/10/2016   Asthma    CAD (coronary artery disease) 08/16/2016   CHF (congestive heart failure) (HCC)    COPD (chronic obstructive pulmonary disease) (HCC) 11/06/2019   DCM (dilated cardiomyopathy) (HCC)    Dyslipidemia 11/06/2019   Dyspnea    HTN (hypertension)    LBBB (left bundle branch block) 08/10/2016   Lymphadenopathy 08/17/2016   OSA (obstructive sleep apnea) 11/18/2018   [place] home sleep study revealed Severe Obstructive Sleep Apnea with AHI 65.8/hr. Central sleep  apnea was noted with an pAHIc of 11.4/hr. % Cheyne Stokes respirations was 10.3%. Nocturnal Hypoxemia was noted with O2 saturations as low as 72%. Time spent with Oxygen desaturations < 88% was 97.6 minutes.   PUD (peptic ulcer disease) 08/17/2016   Pulmonary HTN (HCC) 08/17/2016    Past Surgical History: Past Surgical History:  Procedure Laterality Date   BIOPSY  11/07/2019   Procedure: BIOPSY;  Surgeon: 10/17/2016, MD;  Location: [facility];  Service: Gastroenterology;;   BIOPSY  11/08/2019   Procedure: BIOPSY;  Surgeon: [name], MD;  Location: [facility];  Service: Gastroenterology;;   COLONOSCOPY WITH PROPOFOL N/A 11/08/2019  Procedure: COLONOSCOPY WITH PROPOFOL;  Surgeon: [name], MD;  Location: [facility];  Service: Gastroenterology;  Laterality: N/A;   ESOPHAGOGASTRODUODENOSCOPY (EGD) WITH PROPOFOL Left 08/11/2016   Procedure: ESOPHAGOGASTRODUODENOSCOPY (EGD) WITH PROPOFOL;  Surgeon: Willis Modena, MD;  Location: [facility] ENDOSCOPY;  Service: Endoscopy;  Laterality: Left;   ESOPHAGOGASTRODUODENOSCOPY (EGD) WITH PROPOFOL Left 11/07/2019   Procedure: ESOPHAGOGASTRODUODENOSCOPY (EGD) WITH PROPOFOL;  Surgeon: [name], MD;  Location: [facility];  Service: Gastroenterology;  Laterality: Left;   POLYPECTOMY  11/08/2019   Procedure: POLYPECTOMY;  Surgeon: [name], MD;  Location: [facility];  Service: Gastroenterology;;   RIGHT/LEFT HEART CATH AND CORONARY ANGIOGRAPHY N/A 08/15/2016   Procedure: Right/Left Heart Cath and Coronary Angiography;  Surgeon: Corky Crafts, MD;  Location: [facility] INVASIVE CV LAB;  Service: Cardiovascular;  Laterality: N/A;   RIGHT/LEFT HEART CATH AND CORONARY ANGIOGRAPHY N/A 08/04/2018   Procedure: RIGHT/LEFT HEART CATH AND CORONARY ANGIOGRAPHY;  Surgeon: Dolores Patty, MD;  Location: [facility] INVASIVE CV LAB;  Service: Cardiovascular;  Laterality: N/A;    Family History: Family History  Problem Relation Age of Onset    Hypertension Mother    Hypertension Sister     Social History: Social History   Socioeconomic History   Marital status: Single    Spouse name: Not on file   Number of children: Not on file   Years of education: Not on file   Highest education level: Not on file  Occupational History   Occupation: retired  Tobacco Use   Smoking status: Former    Packs/day: 0.50    Years: 44.00    Pack years: 22.00    Types: Cigarettes    Quit date: 06/11/1998    Years since quitting: 22.7   Smokeless tobacco: Never  Vaping Use   Vaping Use: Never used  Substance and Sexual Activity   Alcohol use: Not Currently    Comment: h/o heavy use   Drug use: Not Currently   Sexual activity: Not on file  Other Topics Concern   Not on file  Social History Narrative   Not on file   Social Determinants of Health   Financial Resource Strain: Not on file  Food Insecurity: Not on file  Transportation Needs: Not on file  Physical Activity: Not on file  Stress: Not on file  Social Connections: Not on file    Allergies:  Allergies  Allergen Reactions   Penicillins     Pt reports it makes him feel shaky Has patient had a PCN reaction causing immediate rash, facial/tongue/throat swelling, SOB or lightheadedness with hypotension: YES Has patient had a PCN reaction causing severe rash involving mucus membranes or skin necrosis: NO Has patient had a PCN reaction that required hospitalization NO Has patient had a PCN reaction occurring within the last 10 years: NO If all of the above answers are "NO", then may proceed with Cephalosporin use.    Objective:    Vital Signs:   Temp:  [98.2 F (36.8 C)-98.9 F (37.2 C)] 98.3 F (36.8 C) (09/15 0815) Pulse Rate:  [57-84] 60 (09/15 0820) Resp:  [17-43] 21 (09/15 1023) BP: (73-118)/(36-84) 93/53 (09/15 0820) SpO2:  [85 %-100 %] 100 % (09/15 1023) FiO2 (%):  [40 %] 40 % (09/15 1023) Weight:  [107 kg-111.1 kg] 107.2 kg (09/15 0309) Last BM Date:  02/21/21  Weight change: Filed Weights   02/22/21 1727 02/22/21 2245 02/23/21 0309  Weight: 111.1 kg 107 kg 107.2 kg    Intake/Output:   Intake/Output Summary (Last  24 hours) at 02/23/2021 1110 Last data filed at 02/23/2021 1055 Gross per 24 hour  Intake 824 ml  Output 1150 ml  Net -326 ml      Physical Exam    General:   On Bipap  HEENT: normal Neck: supple. JVP difficult to assess.  Carotids 2+ bilat; no bruits. No lymphadenopathy or thyromegaly appreciated. Cor: PMI nondisplaced. Regular rate & rhythm. No rubs, gallops or murmurs. Lungs: clear Abdomen: soft, nontender, nondistended. No hepatosplenomegaly. No bruits or masses. Good bowel sounds. Extremities: no cyanosis, clubbing, rash, edema Neuro: alert & orientedx3, cranial nerves grossly intact. moves all 4 extremities w/o difficulty. Affect pleasant   Telemetry  SR/Sinus Brady  EKG    SR 74 bpm 2nd degree heart block   Labs   Basic Metabolic Panel: Recent Labs  Lab 02/22/21 1727 02/22/21 1915 02/23/21 0426 02/23/21 0859  NA 137 137 138 137  K 4.7 4.7 5.5* 5.3*  CL 97*  --  97*  --   CO2 29  --  31  --   GLUCOSE 99  --  136*  --   BUN 30*  --  32*  --   CREATININE 2.23*  --  2.05*  --   CALCIUM 8.7*  --  8.7*  --   MG  --   --  2.7*  --   PHOS  --   --  7.1*  --     Liver Function Tests: Recent Labs  Lab 02/22/21 1727  AST 12*  ALT 6  ALKPHOS 55  BILITOT 0.6  PROT 6.9  ALBUMIN 3.7   No results for input(s): LIPASE, AMYLASE in the last 168 hours. No results for input(s): AMMONIA in the last 168 hours.  CBC: Recent Labs  Lab 02/22/21 1727 02/22/21 1915 02/23/21 0426 02/23/21 0859  WBC 15.0*  --  15.4*  --   NEUTROABS 12.1*  --   --   --   HGB 6.4* 8.8* 7.4* 9.2*  HCT 26.3* 26.0* 29.2* 27.0*  MCV 75.4*  --  76.8*  --   PLT 675*  --  619*  --     Cardiac Enzymes: No results for input(s): CKTOTAL, CKMB, CKMBINDEX, TROPONINI in the last 168 hours.  BNP: BNP (last 3  results) Recent Labs    02/22/21 1727  BNP 480.0*    ProBNP (last 3 results) No results for input(s): PROBNP in the last 8760 hours.   CBG: Recent Labs  Lab 02/22/21 2228 02/23/21 0734  GLUCAP 111* 126*    Coagulation Studies: No results for input(s): LABPROT, INR in the last 72 hours.   Imaging   DG Chest 2 View  Result Date: 02/22/2021 CLINICAL DATA:  Shortness of breath. EXAM: CHEST - 2 VIEW COMPARISON:  Chest x-ray 07/30/2018. FINDINGS: The heart is enlarged. There is central pulmonary vascular congestion. There is no focal lung infiltrate, pleural effusion or pneumothorax. There are minimal atelectatic changes in the lung bases. No acute fractures are seen. Degenerative changes affect the spine. IMPRESSION: Cardiomegaly with central pulmonary vascular congestion. Electronically Signed   By: Darliss Cheney M.D.   On: 02/22/2021 17:55     Medications:     Current Medications:  sodium chloride   Intravenous Once   arformoterol  15 mcg Nebulization BID   budesonide (PULMICORT) nebulizer solution  0.5 mg Nebulization BID   calcium acetate  667 mg Oral TID WC   Chlorhexidine Gluconate Cloth  6 each Topical QHS   pantoprazole (PROTONIX)  IV  40 mg Intravenous QHS   sodium zirconium cyclosilicate  5 g Oral Once    Infusions:  aztreonam 2 g (02/23/21 0905)   linezolid (ZYVOX) IV 600 mg (02/23/21 0947)      Patient Profile   Kristopher [name] is a 70 year old with a history of obesity, HTN, DM2, Asthma, chronic combined diastolic/systolic CHF, severe OSA (AHI 65) and NICM.   Admitted with acute hypercarbic respiratory failure, possible sepisis, and anemia   Assessment/Plan  Acute Hypercarbic Respiratory Failure -Has known severe OSA + COPD . Possible CAP. -Home CPAP machine broken for the last 2 months and cant afford to replace.  -Hypoxemic on arrival to ED. Placed on Bipap. CO2 77.5, bicarb 35.6, PH 7.2 on bicarb.  - Sats currently stable on Bipap.   - Steroids. +  empiric antibiotics.   2. Possible Sepsis -Hypotensive on arrival. Lactic acid 1.5 Procalcitonin 0.4 WBC 15.4  -Blood Cx obtained 9/15 -On empiric antibiotics   -HF meds held. SBP stable today.   3. Anemia  -Hgb 6.3 given 1uPRBC-->8.8>7.4  -No obvious source.  -11/06/19 Had EGD and colonoscopy earlier this year. Had polyps and diverticulosis. FOBT ordered.   4. A/C Systolic Heart Failure, NICM  -Had LHC 2020 with minimal non obstructive CAD -EF has been < 35% for a few years. He declined ICD. Most recent ECHO EF 25-30% and RV mildly reduced.  - Echo repeated today.  -BNP not that hight 480.  -CXR with possible vascular congestion.  -Hold diuretics. Try to check Reds Clip. Appears warm and BP stable.  -Holding HF meds with hypotension  -BB on hold with bradycardia.   5. AKI  -Creatinine 2.2 on admit.  - Creatinine baseline 1.1-1.4  -Holding diuretics/entresto   6. Hyperkalemia  -K 5.5. today. Treated lokelma x2.   Will try to obtain Reds Clip.   Consult TOC for help for CPAP cost.  Length of Stay: 1  Amy Clegg, NP  02/23/2021, 11:10 AM  Advanced Heart Failure Team Pager [phone] (M-F; 7a - 5p)  Please contact [facility] for night-coverage after hours (4p -7a ) and weekends on [email or web address]   Patient seen and examined with the above-signed Advanced Practice Provider and/or Housestaff. I personally reviewed laboratory data, imaging studies and relevant notes. I independently examined the patient and formulated the important aspects of the plan. I have edited the note to reflect any of my changes or salient points. I have personally discussed the plan with the patient and/or family.  70 y/o morbidly obese male with h/o systolic HF due to NICM, OSA/OHS.  Admitted with hypercarbic respiratory failure, AKI and possible sepsis after CPAP device broken for past 2 months.   Much improved with BIPAP and abx.  Hstrop and BNP ok.   Repeat echo today reviewed personally. EF  55-60% with normal RV  ECG with atypical LBBB  General:  Sitting in bed on bipap  HEENT: normal Neck: supple. no obvious jvd but hard to see Carotids 2+ bilat; no bruits. No lymphadenopathy or thryomegaly appreciated. Cor: PMI nondisplaced. Regular rate & rhythm. No rubs, gallops or murmurs. Lungs: coarse Abdomen: obese soft, nontender, nondistended. No hepatosplenomegaly. No bruits or masses. Good bowel sounds. Extremities: no cyanosis, clubbing, rash, edema Neuro: alert & orientedx3, cranial nerves grossly intact. moves all 4 extremities w/o difficulty. Affect pleasant  Given exam and echo findings doubt that this a HF flare. Would avoid diuretics for now. Needs to get his CPAP machine fixed. Continue abx per  CCM. Will need to restart HF meds prior to d/c to help prevent recurrent cardiomyopathy.   Arvilla Meres, MD  2:47 PM

## 2021-02-23 NOTE — Progress Notes (Signed)
NAME:  Kristopher [name], MRN:  [id number], DOB:  1951-04-27, LOS: 1 ADMISSION DATE:  02/22/2021, CONSULTATION DATE:  02/23/2021  REFERRING MD:  Tegeler, EDP, CHIEF COMPLAINT:  hypotension, resp distres on bipap   History of Present Illness:  70 year old with chronic systolic CHF, nonischemic cardiomyopathy and pulmonary hypertension brought in by EMS with shortness of breath for 3 weeks, complains of cough with white sputum production.  Noted to be hypoxic to 76% and initially improved with 3 L oxygen but later on became somnolent in the ED and placed on BiPAP for VBG of 7.2 7/78/172.  ED evaluation showed mild leukocytosis of 15 K, drop in hemoglobin from 7.9 in June to 6.4 ,Slight high BNP of 480 and rise of creatinine from 1.2-2.2 Mental status improved with BiPAP but he was also mildly hypotensive, due to multiple critical issues, PCCM consulted for admission. Chest x-ray showed cardiomegaly with mild interstitial prominence and small effusions He reports compliance with his CPAP which brought down a few months ago  Pertinent  Medical History  HFrEF, NICM EF 30-35% , declined ICD , LBBB PH Severe OSA on CPAP ? COPD quit smoking 20 years ago  Significant Hospital Events: Including procedures, antibiotic start and stop dates in addition to other pertinent events   10/2019 EGD negative, colonoscopy showed hemorrhoids and polyps  Interim History / Subjective:   Remains on BiPAP.  Has not needed pressors but BP still soft.  More awake today and answering questions.  Want something to eat  Objective   Blood pressure (!) 95/59, pulse 66, temperature 98.3 F (36.8 C), temperature source Axillary, resp. rate (!) 28, height 5\' 7"  (1.702 m), weight 107.2 kg, SpO2 100 %.    FiO2 (%):  [40 %] 40 %   Intake/Output Summary (Last 24 hours) at 02/23/2021 0848 Last data filed at 02/23/2021 0500 Gross per 24 hour  Intake 824 ml  Output 750 ml  Net 74 ml   Filed Weights   02/22/21 1727 02/22/21 2245  02/23/21 0309  Weight: 111.1 kg 107 kg 107.2 kg    Examination: Gen:   Critically ill-appearing, on BiPAP HEENT:  EOMI, sclera anicteric Neck:     No masses; no thyromegaly Lungs:    Diminished breath sounds CV:         Regular rate and rhythm; no murmurs Abd:      + bowel sounds; soft, non-tender; no palpable masses, no distension Ext:    No edema; adequate peripheral perfusion Skin:      Warm and dry; no rash Neuro: Awake, alert  Labs/imaging personally reviewed Significant for potassium at 5.5, creatinine improved to 2.05 Lactic acid 0.8 WBC elevated to 15.4  Chest x-ray yesterday shows cardiomegaly with vascular congestion  Resolved Hospital Problem list      Assessment & Plan:  Multiple issues ongoing without unifying diagnosis -he has acute hypercarbic respiratory failure with AKI does not appear to be in obvious fluid overload, mildly hypertensive, with drop in hemoglobin to 6.4 without obvious GI bleed.  He reports compliance with his diuretics and is also on Entresto.  He has mild leukocytosis and pneumonia cannot be excluded  Acute hypercarbic respiratory failure - Continues on BiPAP.  We will recheck ABG and give him a break if improved He is empirically on aztreonam due to severe penicillin allergy.  Add linezolid for gram-positive coverage Low threshold to stop as procalcitonin is normal Continue Pulmicort, Brovana Check blood cultures  AKI Hyperkalemia Got Lokelma Holding entresto  and spironolactone  Chronic systolic heart failure  Wenckebach's Hold meds as above, will hold Coreg until heart rate improves. Consult cardiology  Anemia -Hemoccult stool , previous EGD colonoscopy was nondiagnostic in 2021 S/p 1 unit PRBC Will avoid subcutaneous heparin for now  Best Practice (right click and "Reselect all SmartList Selections" daily)   Diet/type: full liquids  DVT prophylaxis: SCD GI prophylaxis: PPI Lines: N/A Foley:  N/A Code Status:  full  code Last date of multidisciplinary goals of care discussion [NA]  Critical care time:    The patient is critically ill with multiple organ system failure and requires high complexity decision making for assessment and support, frequent evaluation and titration of therapies, advanced monitoring, review of radiographic studies and interpretation of complex data.   Critical Care Time devoted to patient care services, exclusive of separately billable procedures, described in this note is 35 minutes.   Chilton Greathouse MD [facility] See Amion for pager  If no response to pager , please call [phone] until 7pm After 7:00 pm call Elink  [phone] 02/23/2021, 8:48 AM

## 2021-02-24 ENCOUNTER — Inpatient Hospital Stay (HOSPITAL_COMMUNITY): Payer: Medicare Other

## 2021-02-24 DIAGNOSIS — I5022 Chronic systolic (congestive) heart failure: Secondary | ICD-10-CM | POA: Diagnosis not present

## 2021-02-24 DIAGNOSIS — J9622 Acute and chronic respiratory failure with hypercapnia: Secondary | ICD-10-CM | POA: Diagnosis not present

## 2021-02-24 LAB — CBC
HCT: 26 % — ABNORMAL LOW (ref 39.0–52.0)
Hemoglobin: 6.8 g/dL — CL (ref 13.0–17.0)
MCH: 20.1 pg — ABNORMAL LOW (ref 26.0–34.0)
MCHC: 26.2 g/dL — ABNORMAL LOW (ref 30.0–36.0)
MCV: 76.7 fL — ABNORMAL LOW (ref 80.0–100.0)
Platelets: 541 10*3/uL — ABNORMAL HIGH (ref 150–400)
RBC: 3.39 MIL/uL — ABNORMAL LOW (ref 4.22–5.81)
RDW: 19.9 % — ABNORMAL HIGH (ref 11.5–15.5)
WBC: 11.1 10*3/uL — ABNORMAL HIGH (ref 4.0–10.5)
nRBC: 2.2 % — ABNORMAL HIGH (ref 0.0–0.2)

## 2021-02-24 LAB — BASIC METABOLIC PANEL
Anion gap: 11 (ref 5–15)
BUN: 38 mg/dL — ABNORMAL HIGH (ref 8–23)
CO2: 32 mmol/L (ref 22–32)
Calcium: 9.3 mg/dL (ref 8.9–10.3)
Chloride: 94 mmol/L — ABNORMAL LOW (ref 98–111)
Creatinine, Ser: 1.7 mg/dL — ABNORMAL HIGH (ref 0.61–1.24)
GFR, Estimated: 43 mL/min — ABNORMAL LOW (ref >60)
Glucose, Bld: 100 mg/dL — ABNORMAL HIGH (ref 70–99)
Potassium: 5 mmol/L (ref 3.5–5.1)
Sodium: 137 mmol/L (ref 135–145)

## 2021-02-24 LAB — GLUCOSE, CAPILLARY: Glucose-Capillary: 94 mg/dL (ref 70–99)

## 2021-02-24 LAB — HEMOGLOBIN AND HEMATOCRIT, BLOOD
HCT: 30.6 % — ABNORMAL LOW (ref 39.0–52.0)
Hemoglobin: 8.1 g/dL — ABNORMAL LOW (ref 13.0–17.0)

## 2021-02-24 LAB — PHOSPHORUS: Phosphorus: 4.4 mg/dL (ref 2.5–4.6)

## 2021-02-24 LAB — MAGNESIUM: Magnesium: 2.7 mg/dL — ABNORMAL HIGH (ref 1.7–2.4)

## 2021-02-24 LAB — PROCALCITONIN: Procalcitonin: 0.18 ng/mL

## 2021-02-24 LAB — OCCULT BLOOD X 1 CARD TO LAB, STOOL: Fecal Occult Bld: POSITIVE — AB

## 2021-02-24 LAB — PREPARE RBC (CROSSMATCH)

## 2021-02-24 MED ORDER — AZITHROMYCIN 500 MG PO TABS
500.0000 mg | ORAL_TABLET | Freq: Every day | ORAL | Status: DC
  Filled 2021-02-24: qty 1

## 2021-02-24 MED ORDER — AZITHROMYCIN 500 MG PO TABS
500.0000 mg | ORAL_TABLET | Freq: Every day | ORAL | Status: AC
  Administered 2021-02-24 – 2021-02-26 (×3): 500 mg via ORAL
  Filled 2021-02-24 (×3): qty 1

## 2021-02-24 MED ORDER — CEFTRIAXONE SODIUM 1 G IJ SOLR
1.0000 g | INTRAMUSCULAR | Status: AC
  Administered 2021-02-24 – 2021-03-01 (×6): 1 g via INTRAVENOUS
  Filled 2021-02-24 (×6): qty 10

## 2021-02-24 MED ORDER — SODIUM CHLORIDE 0.9% IV SOLUTION: Freq: Once | INTRAVENOUS | Status: DC

## 2021-02-24 MED ORDER — PANTOPRAZOLE SODIUM 40 MG IV SOLR
40.0000 mg | Freq: Two times a day (BID) | INTRAVENOUS | Status: DC
  Administered 2021-02-24 – 2021-02-27 (×6): 40 mg via INTRAVENOUS
  Filled 2021-02-24 (×6): qty 40

## 2021-02-24 NOTE — H&P (View-Only) (Signed)
Referring Provider: Dr. Cyril Mourning Primary Care Physician:  Shirlean Mylar, MD Primary Gastroenterologist:  Gentry Fitz  Reason for Consultation:  anemia  HPI: Kristopher [name] is a 70 y.o. male with chronic systolic CHF, nonischemic cardiomyopathy, COPD, AKI and pulmonary hypertension evaluated in the hospital intially for  hypoxia, AMS, and hypotension being followed by Critical care presents for consultation of anemia. Patient had previous work up for anemia 2021 with EGD/colonoscopy.   Had drop in hemoglobin this visit from 9.2 to 6.8, has been transfused 1 unit PRBC, hemoglobin now 8.1. Look back his hemoglobin has been around 8 since 2021, in 2020 hemoglobin was 12.1.  11/2020 BUN 16, Cr 1.19, on admission increased BUN 30 and Cr 2.23 Fecal occult blood positive 10/2019  Colonoscopy 11/08/2019 for iron def anemia with Dr. Levora Angel showed 9 polyps 2-10 mm in size- pathology tubular adenoma, diverticulosis, medium internal hemorrhoids and recall 3 years.  Endoscopy 11/07/2019 showed Medium-sized hiatal hernia and Erosive gastropathy with no bleeding and no stigmata of recent bleeding. Positive for H pylori  Patient had shortness of breath and cough for last 2-3 weeks which prompted admission.  Patient is on omeprazole 40 mg once daily for GERD, states this is well controlled. Denies dysphagia, odynophagia. Denies melena, hematochezia.  Has had soft normal BM's per patient, has had every other day last 2 weeks due to decreased PO intake.  He does have early satiety, denies AB bloating.  He does take 325mg  ASA at least 3 x a week for headaches, otherwise not on blood thinner, no NSAIDS. Denies ETOH or drug use.  Denies family history of stomach or colon cancer.   Past Medical History:  Diagnosis Date   Acute combined systolic and diastolic heart failure (HCC) 08/11/2016   Acute esophagitis 08/17/2016   Acute upper GI bleed 08/10/2016   Asthma    CAD (coronary artery disease) 08/16/2016   CHF  (congestive heart failure) (HCC)    COPD (chronic obstructive pulmonary disease) (HCC) 11/06/2019   DCM (dilated cardiomyopathy) (HCC)    Dyslipidemia 11/06/2019   Dyspnea    HTN (hypertension)    LBBB (left bundle branch block) 08/10/2016   Lymphadenopathy 08/17/2016   OSA (obstructive sleep apnea) 11/18/2018   [place] home sleep study revealed Severe Obstructive Sleep Apnea with AHI 65.8/hr. Central sleep apnea was noted with an pAHIc of 11.4/hr. % Cheyne Stokes respirations was 10.3%. Nocturnal Hypoxemia was noted with O2 saturations as low as 72%. Time spent with Oxygen desaturations < 88% was 97.6 minutes.   PUD (peptic ulcer disease) 08/17/2016   Pulmonary HTN (HCC) 08/17/2016    Past Surgical History:  Procedure Laterality Date   BIOPSY  11/07/2019   Procedure: BIOPSY;  Surgeon: [name], MD;  Location: [facility];  Service: Gastroenterology;;   BIOPSY  11/08/2019   Procedure: BIOPSY;  Surgeon: [name], MD;  Location: [facility];  Service: Gastroenterology;;   COLONOSCOPY WITH PROPOFOL N/A 11/08/2019   Procedure: COLONOSCOPY WITH PROPOFOL;  Surgeon: [name], MD;  Location: [facility];  Service: Gastroenterology;  Laterality: N/A;   ESOPHAGOGASTRODUODENOSCOPY (EGD) WITH PROPOFOL Left 08/11/2016   Procedure: ESOPHAGOGASTRODUODENOSCOPY (EGD) WITH PROPOFOL;  Surgeon: Willis Modena, MD;  Location[facility] ENDOSCOPY;  Service: Endoscopy;  Laterality: Left;   ESOPHAGOGASTRODUODENOSCOPY (EGD) WITH PROPOFOL Left 11/07/2019   Procedure: ESOPHAGOGASTRODUODENOSCOPY (EGD) WITH PROPOFOL;  Surgeon: [name], MD;  Location: [facility];  Service: Gastroenterology;  Laterality: Left;   POLYPECTOMY  11/08/2019   Procedure: POLYPECTOMY;  Surgeon: [name], MD;  Location: [facility]OS[facility];  Service: Gastroenterology;;   RIGHT/LEFT HEART CATH AND CORONARY ANGIOGRAPHY N/A 08/15/2016   Procedure: Right/Left Heart Cath and Coronary Angiography;  Surgeon: Corky Crafts, MD;   Location: [facility] INVASIVE CV LAB;  Service: Cardiovascular;  Laterality: N/A;   RIGHT/LEFT HEART CATH AND CORONARY ANGIOGRAPHY N/A 08/04/2018   Procedure: RIGHT/LEFT HEART CATH AND CORONARY ANGIOGRAPHY;  Surgeon: Dolores Patty, MD;  Location: [facility] INVASIVE CV LAB;  Service: Cardiovascular;  Laterality: N/A;    Prior to Admission medications   Medication Sig Start Date End Date Taking? Authorizing Provider  acetaminophen (TYLENOL) 325 MG tablet Take 650 mg by mouth every 6 (six) hours as needed for mild pain.    Yes [provider]  albuterol (PROVENTIL) (2.5 MG/3ML) 0.083% nebulizer solution Inhale 3 mLs into the lungs in the morning and at bedtime. 06/20/18  Yes [provider]  aspirin 81 MG chewable tablet Chew 1 tablet (81 mg total) by mouth daily. 09/15/18  Yes Alford Highland, NP  atorvastatin (LIPITOR) 40 MG tablet Take 1 tablet (40 mg total) by mouth daily at 6 PM. Needs appt for further refills 11/01/20  Yes Bensimhon, Bevelyn Buckles, MD  carvedilol (COREG) 6.25 MG tablet Take 1.5 tablets (9.375 mg total) by mouth 2 (two) times daily with a meal for 30 days. 12/04/18 02/22/21 Yes Bensimhon, Bevelyn Buckles, MD  ferrous sulfate 325 (65 FE) MG tablet Take 325 mg by mouth daily with breakfast.   Yes [provider]  furosemide (LASIX) 40 MG tablet Take 2 tablets (80 mg total) by mouth 2 (two) times daily. 02/11/20  Yes Bensimhon, Bevelyn Buckles, MD  omeprazole (PRILOSEC) 40 MG capsule Take 40 mg by mouth daily as needed (gerd).    Yes [provider]  potassium chloride (KLOR-CON) 10 MEQ tablet Take 1 tablet (10 mEq total) by mouth daily. 06/29/19  Yes Bensimhon, Bevelyn Buckles, MD  sacubitril-valsartan (ENTRESTO) 97-103 MG Take 1 tablet by mouth 2 (two) times daily. 01/20/20  Yes Bensimhon, Bevelyn Buckles, MD  spironolactone (ALDACTONE) 25 MG tablet Take 1 tablet (25 mg total) by mouth daily. 12/15/19  Yes Bensimhon, Bevelyn Buckles, MD  SYMBICORT 160-4.5 MCG/ACT inhaler Inhale 1 puff into the lungs in the  morning and at bedtime. 09/08/19  Yes [provider]  ipratropium (ATROVENT) 0.02 % nebulizer solution Take 2.5 mLs (0.5 mg total) by nebulization 2 (two) times daily. Patient not taking: Reported on 02/22/2021 08/08/18   Zigmund Daniel., MD    Scheduled Meds:  sodium chloride   Intravenous Once   sodium chloride   Intravenous Once   arformoterol  15 mcg Nebulization BID   azithromycin  500 mg Oral Daily   budesonide (PULMICORT) nebulizer solution  0.5 mg Nebulization BID   Chlorhexidine Gluconate Cloth  6 each Topical QHS   pantoprazole (PROTONIX) IV  40 mg Intravenous QHS   Continuous Infusions:  cefTRIAXone (ROCEPHIN)  IV Stopped (02/24/21 1213)   PRN Meds:.albuterol, docusate sodium, polyethylene glycol  Allergies as of 02/22/2021 - Review Complete 02/22/2021  Allergen Reaction Noted   Penicillins  02/10/2016    Family History  Problem Relation Age of Onset   Hypertension Mother    Hypertension Sister     Social History   Socioeconomic History   Marital status: Single    Spouse name: Not on file   Number of children: Not on file   Years of education: Not on file   Highest education level: Not on file  Occupational History   Occupation: retired  Tobacco Use   Smoking status: Former    Packs/day: 0.50    Years: 44.00    Pack years: 22.00    Types: Cigarettes    Quit date: 06/11/1998    Years since quitting: 22.7   Smokeless tobacco: Never  Vaping Use   Vaping Use: Never used  Substance and Sexual Activity   Alcohol use: Not Currently    Comment: h/o heavy use   Drug use: Not Currently   Sexual activity: Not on file  Other Topics Concern   Not on file  Social History Narrative   Not on file   Social Determinants of Health   Financial Resource Strain: Not on file  Food Insecurity: Not on file  Transportation Needs: Not on file  Physical Activity: Not on file  Stress: Not on file  Social Connections: Not on file  Intimate Partner  Violence: Not on file    Review of Systems:  Review of Systems  Constitutional:  Positive for malaise/fatigue. Negative for chills and fever.  Respiratory:  Positive for cough and shortness of breath.   Cardiovascular:  Negative for chest pain.  Gastrointestinal:  Negative for abdominal pain, blood in stool, constipation, diarrhea, heartburn, melena, nausea and vomiting.  Musculoskeletal:  Negative for falls.  Psychiatric/Behavioral:  Negative for depression.     Physical Exam: Vital signs: Vitals:   02/24/21 1300 02/24/21 1400  BP: (!) 118/42 (!) 94/50  Pulse: 71 70  Resp: (!) 22 (!) 26  Temp:    SpO2: 95% 98%   Last BM Date: 02/21/21  Physical Exam Constitutional:      Comments: Obese sitting comfortably in bed with 3 L [place] in place.  HENT:     Mouth/Throat:     Comments: Poor dentition.  Cardiovascular:     Rate and Rhythm: Normal rate and regular rhythm.     Comments: Decreased heart sounds Pulmonary:     Effort: Pulmonary effort is normal.     Breath sounds: No wheezing, rhonchi or rales.     Comments: 3L  Abdominal:     General: Abdomen is protuberant. Bowel sounds are normal. There is no distension.     Palpations: Abdomen is soft. There is no hepatomegaly or splenomegaly.     Tenderness: There is no abdominal tenderness. There is no guarding or rebound.  Musculoskeletal:        General: Normal range of motion.     Right lower leg: No edema.     Left lower leg: No edema.  Skin:    General: Skin is dry.  Neurological:     Mental Status: He is oriented to person, place, and time.     Cranial Nerves: No cranial nerve deficit.  Psychiatric:        Mood and Affect: Mood normal.        Behavior: Behavior normal.        GI:  Lab Results: Recent Labs    02/22/21 1727 02/22/21 1915 02/23/21 0426 02/23/21 0859 02/23/21 1354 02/24/21 0159 02/24/21 1046  WBC 15.0*  --  15.4*  --   --  11.1*  --   HGB 6.4*   < > 7.4*   < > 9.2* 6.8* 8.1*  HCT 26.3*    < > 29.2*   < > 27.0* 26.0* 30.6*  PLT 675*  --  619*  --   --  541*  --    < > = values in  this interval not displayed.   BMET Recent Labs    02/22/21 1727 02/22/21 1915 02/23/21 0426 02/23/21 0859 02/23/21 1354 02/24/21 0159  NA 137   < > 138 137 137 137  K 4.7   < > 5.5* 5.3* 5.1 5.0  CL 97*  --  97*  --   --  94*  CO2 29  --  31  --   --  32  GLUCOSE 99  --  136*  --   --  100*  BUN 30*  --  32*  --   --  38*  CREATININE 2.23*  --  2.05*  --   --  1.70*  CALCIUM 8.7*  --  8.7*  --   --  9.3   < > = values in this interval not displayed.   LFT Recent Labs    02/22/21 1727  PROT 6.9  ALBUMIN 3.7  AST 12*  ALT 6  ALKPHOS 55  BILITOT 0.6   PT/INR No results for input(s): LABPROT, INR in the last 72 hours.  Studies/Results: DG Chest 2 View  Result Date: 02/22/2021 CLINICAL DATA:  Shortness of breath. EXAM: CHEST - 2 VIEW COMPARISON:  Chest x-ray 07/30/2018. FINDINGS: The heart is enlarged. There is central pulmonary vascular congestion. There is no focal lung infiltrate, pleural effusion or pneumothorax. There are minimal atelectatic changes in the lung bases. No acute fractures are seen. Degenerative changes affect the spine. IMPRESSION: Cardiomegaly with central pulmonary vascular congestion. Electronically Signed   By: Darliss Cheney M.D.   On: 02/22/2021 17:55   DG Chest Port 1 View  Result Date: 02/24/2021 CLINICAL DATA:  Acute respiratory failure EXAM: PORTABLE CHEST 1 VIEW COMPARISON:  Chest radiograph 02/22/2021 FINDINGS: The heart is mildly enlarged, unchanged. The mediastinal contours are stable. Lung volumes are low. There is a small left pleural effusion with adjacent airspace disease, similar to the study from two days ago. There is no new focal airspace disease. There is no significant right pleural effusion. There is no pneumothorax. There is no acute osseous abnormality. IMPRESSION: Low lung volumes with a small left pleural effusion and adjacent airspace  disease, not significantly changed. Electronically Signed   By: Lesia Hausen M.D.   On: 02/24/2021 08:32   ECHOCARDIOGRAM COMPLETE  Result Date: 02/23/2021    ECHOCARDIOGRAM REPORT   Patient Name:   Kristopher [name] [facility] Date of Exam: 02/23/2021 Medical Rec #:  [id number]     Height:       67.0 in Accession #:    [id number]    Weight:       236.3 lb Date of Birth:  [birth year]     BSA:          2.171 m Patient Age:    69 years      BP:           93/53 mmHg Patient Gender: M             HR:           62 bpm. Exam Location:  Inpatient Procedure: 2D Echo Indications:    acute respiratory distress  History:        Patient has prior history of Echocardiogram examinations, most                 recent 11/13/2018. Cardiomyopathy, CAD, COPD and Pulmonary HTN,                 Arrythmias:LBBB, Signs/Symptoms:Shortness of Breath; Risk  Factors:Hypertension, Dyslipidemia and Sleep Apnea.  Sonographer:    Delcie Roch RDCS Referring Phys: [id number] Clarene Critchley [facility]  Sonographer Comments: Technically difficult study due to poor echo windows. Image acquisition challenging due to patient body habitus. IMPRESSIONS  1. Left ventricular ejection fraction, by estimation, is 70 to 75%. The left ventricle has hyperdynamic function. The left ventricle has no regional wall motion abnormalities. There is mild left ventricular hypertrophy. Left ventricular diastolic parameters are indeterminate.  2. Right ventricular systolic function is normal. The right ventricular size is normal.  3. Left atrial size was moderately dilated.  4. Right atrial size was moderately dilated.  5. The mitral valve is normal in structure. Mild mitral valve regurgitation.  6. The aortic valve is tricuspid. Aortic valve regurgitation is mild. Mild aortic valve sclerosis is present, with no evidence of aortic valve stenosis.  7. The inferior vena cava is dilated in size with >50% respiratory variability, suggesting right atrial pressure of 8 mmHg. FINDINGS  Left  Ventricle: Left ventricular ejection fraction, by estimation, is 70 to 75%. The left ventricle has hyperdynamic function. The left ventricle has no regional wall motion abnormalities. The left ventricular internal cavity size was normal in size. There is mild left ventricular hypertrophy. Left ventricular diastolic parameters are indeterminate. Right Ventricle: The right ventricular size is normal. Right vetricular wall thickness was not assessed. Right ventricular systolic function is normal. Left Atrium: Left atrial size was moderately dilated. Right Atrium: Right atrial size was moderately dilated. Pericardium: Trivial pericardial effusion is present. Mitral Valve: The mitral valve is normal in structure. Mild mitral valve regurgitation. Tricuspid Valve: The tricuspid valve is normal in structure. Tricuspid valve regurgitation is mild. Aortic Valve: The aortic valve is tricuspid. Aortic valve regurgitation is mild. Mild aortic valve sclerosis is present, with no evidence of aortic valve stenosis. Pulmonic Valve: The pulmonic valve was normal in structure. Pulmonic valve regurgitation is not visualized. Aorta: The aortic root and ascending aorta are structurally normal, with no evidence of dilitation. Venous: The inferior vena cava is dilated in size with greater than 50% respiratory variability, suggesting right atrial pressure of 8 mmHg. IAS/Shunts: No atrial level shunt detected by color flow Doppler.  LEFT VENTRICLE PLAX 2D LVIDd:         5.50 cm  Diastology LVIDs:         3.10 cm  LV e' medial:    7.83 cm/s LV PW:         1.20 cm  LV E/e' medial:  13.3 LV IVS:        1.10 cm  LV e' lateral:   9.90 cm/s LVOT diam:     2.30 cm  LV E/e' lateral: 10.5 LV SV:         103 LV SV Index:   48 LVOT Area:     4.15 cm  RIGHT VENTRICLE             IVC RV S prime:     14.90 cm/s  IVC diam: 2.50 cm TAPSE (M-mode): 2.3 cm LEFT ATRIUM              Index       RIGHT ATRIUM           Index LA diam:        5.00 cm  2.30 cm/m   RA Area:     23.50 cm LA Vol (A2C):   99.7 ml  45.93 ml/m RA Volume:   81.20 ml  37.40  ml/m LA Vol (A4C):   96.7 ml  44.54 ml/m LA Biplane Vol: 101.0 ml 46.52 ml/m  AORTIC VALVE LVOT Vmax:   118.00 cm/s LVOT Vmean:  76.500 cm/s LVOT VTI:    0.249 m  AORTA Ao Root diam: 2.80 cm Ao Asc diam:  3.30 cm MITRAL VALVE MV Area (PHT): 3.08 cm     SHUNTS MV Decel Time: 246 msec     Systemic VTI:  0.25 m MV E velocity: 104.00 cm/s  Systemic Diam: 2.30 cm MV A velocity: 103.00 cm/s MV E/A ratio:  1.01 Dietrich Pates MD Electronically signed by Dietrich Pates MD Signature Date/Time: 02/23/2021/4:08:10 PM    Final     Impression and Plan Anemia HGB 9.2 to 6.8, has been transfused 1 unit PRBC, hemoglobin 8.1. Appears to have chronic anemia since 2021 11/2020 BUN 16, Cr 1.19, on admission increased BUN 30 and Cr 2.23 Likely UGI Endoscopy 11/07/2019 showed Medium-sized hiatal hernia and Erosive gastropathy with no bleeding and no stigmata of recent bleeding. Positive for H pylori Patient lost to follow up, unknown if H pylori treated.  325 ASA use for headaches  Multiple comorbidities for this hospitalization chronic systolic CHF, nonischemic cardiomyopathy, COPD, AKI and pulmonary hypertension with current hypoxia and hypotension On CPAP only at night, on 3 L [place] high flow, BP continues to be low/soft but patient appears to be stable enough at this time for EGD Discussed case in primate messenger with Dr. Isaiah Serge, patient had normal echo and being moved from ICU today, should be stable enough for EGD.   Plan for EGD tomorrow. I thoroughly discussed the procedure to include nature, alternatives, benefits, and risks including but not limited to bleeding, perforation, infection, anesthesia/cardiac and pulmonary complications. Patient provides understanding and gave verbal consent to proceed. Start Protonix 40 mg IV BID. Clear liquid diet, NPO at midnight.   Continue to monitor H&H with transfusion as needed to maintain  hemoglobin greater than 7.   [facility] will follow.    LOS: 2 days   Doree Albee  PA-C 02/24/2021, 2:19 PM  Contact #  [phone]

## 2021-02-24 NOTE — Progress Notes (Signed)
eLink Physician-Brief Progress Note Patient Name: Kristopher [name] DOB: 03-Jul-1950 MRN: [id number]   Date of Service  02/24/2021  HPI/Events of Note  Hgb below 7... hemodynamics unchanged  eICU Interventions  Ordered another unit prbc     Intervention Category Major Interventions: OtherJacinta Shoe 02/24/2021, 5:54 AM

## 2021-02-24 NOTE — Plan of Care (Signed)

## 2021-02-24 NOTE — Progress Notes (Addendum)
Advanced Heart Failure Rounding Note  PCP-Cardiologist: Armanda Magic, MD   Patient Profile   Mr. Kristopher [name] is a 70 year old male with history of NICM (EF 35%), chronic systolic and diastolic CHF, HTN, DM2, COPD and severe OSA (AHI 65). Admitted 09/15 with acute hypercarbic respiratory failure (off CPAP X 2 months), AKI and possible sepsis.    Subjective:    Feeling better today. No dyspnea at rest. Eager to get up and walk around.   Echo 09/15: LVEF 70-75%, hyperdynamic LV function, biatrial enlargement, mild MR, mild AI, RV okay   Objective:   Weight Range: 107.2 kg Body mass index is 37.02 kg/m.   Vital Signs:   Temp:  [97.8 F (36.6 C)-98.6 F (37 C)] 98.1 F (36.7 C) (09/16 0801) Pulse Rate:  [55-92] 63 (09/16 0748) Resp:  [13-33] 16 (09/16 0748) BP: (84-154)/(27-107) 119/31 (09/16 0639) SpO2:  [89 %-100 %] 95 % (09/16 0748) FiO2 (%):  [40 %-94 %] 40 % (09/16 0325) Last BM Date: 02/21/21  Weight change: Filed Weights   02/22/21 1727 02/22/21 2245 02/23/21 0309  Weight: 111.1 kg 107 kg 107.2 kg    Intake/Output:   Intake/Output Summary (Last 24 hours) at 02/24/2021 0827 Last data filed at 02/24/2021 0600 Gross per 24 hour  Intake 894.24 ml  Output 2120 ml  Net -1225.76 ml      Physical Exam    General:  Well appearing. No resp difficulty HEENT: Poor dentition Neck: Supple. JVD ~ 10. Carotids 2+ bilat; no bruits. No lymphadenopathy or thyromegaly appreciated. Cor: PMI nondisplaced. Regular rate & rhythm. No rubs, gallops or murmurs. Lungs: Scattered wheezes Abdomen: Soft, nontender, nondistended. No hepatosplenomegaly. No bruits or masses. Good bowel sounds. Extremities: No cyanosis, clubbing, rash, edema Neuro: Alert & orientedx3, cranial nerves grossly intact. moves all 4 extremities w/o difficulty. Affect pleasant   Telemetry   SR with intermittent Wenckebach AV block  EKG    No new  Labs    CBC Recent Labs    02/22/21 1727  02/22/21 1915 02/23/21 0426 02/23/21 0859 02/23/21 1354 02/24/21 0159  WBC 15.0*  --  15.4*  --   --  11.1*  NEUTROABS 12.1*  --   --   --   --   --   HGB 6.4*   < > 7.4*   < > 9.2* 6.8*  HCT 26.3*   < > 29.2*   < > 27.0* 26.0*  MCV 75.4*  --  76.8*  --   --  76.7*  PLT 675*  --  619*  --   --  541*   < > = values in this interval not displayed.   Basic Metabolic Panel Recent Labs    59/93/57 0426 02/23/21 0859 02/23/21 1354 02/24/21 0159  NA 138   < > 137 137  K 5.5*   < > 5.1 5.0  CL 97*  --   --  94*  CO2 31  --   --  32  GLUCOSE 136*  --   --  100*  BUN 32*  --   --  38*  CREATININE 2.05*  --   --  1.70*  CALCIUM 8.7*  --   --  9.3  MG 2.7*  --   --  2.7*  PHOS 7.1*  --   --  4.4   < > = values in this interval not displayed.   Liver Function Tests Recent Labs    02/22/21 1727  AST  12*  ALT 6  ALKPHOS 55  BILITOT 0.6  PROT 6.9  ALBUMIN 3.7   No results for input(s): LIPASE, AMYLASE in the last 72 hours. Cardiac Enzymes No results for input(s): CKTOTAL, CKMB, CKMBINDEX, TROPONINI in the last 72 hours.  BNP: BNP (last 3 results) Recent Labs    02/22/21 1727  BNP 480.0*    ProBNP (last 3 results) No results for input(s): PROBNP in the last 8760 hours.   D-Dimer No results for input(s): DDIMER in the last 72 hours. Hemoglobin A1C No results for input(s): HGBA1C in the last 72 hours. Fasting Lipid Panel No results for input(s): CHOL, HDL, LDLCALC, TRIG, CHOLHDL, LDLDIRECT in the last 72 hours. Thyroid Function Tests No results for input(s): TSH, T4TOTAL, T3FREE, THYROIDAB in the last 72 hours.  Invalid input(s): FREET3  Other results:   Imaging    ECHOCARDIOGRAM COMPLETE  Result Date: 02/23/2021    ECHOCARDIOGRAM REPORT   Patient Name:   Kristopher [name] [facility] Date of Exam: 02/23/2021 Medical Rec #:  [id number]     Height:       67.0 in Accession #:    [id number]    Weight:       236.3 lb Date of Birth:  [birth year]     BSA:          2.171 m Patient  Age:    69 years      BP:           93/53 mmHg Patient Gender: M             HR:           62 bpm. Exam Location:  Inpatient Procedure: 2D Echo Indications:    acute respiratory distress  History:        Patient has prior history of Echocardiogram examinations, most                 recent 11/13/2018. Cardiomyopathy, CAD, COPD and Pulmonary HTN,                 Arrythmias:LBBB, Signs/Symptoms:Shortness of Breath; Risk                 Factors:Hypertension, Dyslipidemia and Sleep Apnea.  Sonographer:    Delcie Roch RDCS Referring Phys: [id number] Clarene Critchley [facility]  Sonographer Comments: Technically difficult study due to poor echo windows. Image acquisition challenging due to patient body habitus. IMPRESSIONS  1. Left ventricular ejection fraction, by estimation, is 70 to 75%. The left ventricle has hyperdynamic function. The left ventricle has no regional wall motion abnormalities. There is mild left ventricular hypertrophy. Left ventricular diastolic parameters are indeterminate.  2. Right ventricular systolic function is normal. The right ventricular size is normal.  3. Left atrial size was moderately dilated.  4. Right atrial size was moderately dilated.  5. The mitral valve is normal in structure. Mild mitral valve regurgitation.  6. The aortic valve is tricuspid. Aortic valve regurgitation is mild. Mild aortic valve sclerosis is present, with no evidence of aortic valve stenosis.  7. The inferior vena cava is dilated in size with >50% respiratory variability, suggesting right atrial pressure of 8 mmHg. FINDINGS  Left Ventricle: Left ventricular ejection fraction, by estimation, is 70 to 75%. The left ventricle has hyperdynamic function. The left ventricle has no regional wall motion abnormalities. The left ventricular internal cavity size was normal in size. There is mild left ventricular hypertrophy. Left ventricular diastolic parameters are indeterminate. Right Ventricle: The right ventricular size  is normal. Right  vetricular wall thickness was not assessed. Right ventricular systolic function is normal. Left Atrium: Left atrial size was moderately dilated. Right Atrium: Right atrial size was moderately dilated. Pericardium: Trivial pericardial effusion is present. Mitral Valve: The mitral valve is normal in structure. Mild mitral valve regurgitation. Tricuspid Valve: The tricuspid valve is normal in structure. Tricuspid valve regurgitation is mild. Aortic Valve: The aortic valve is tricuspid. Aortic valve regurgitation is mild. Mild aortic valve sclerosis is present, with no evidence of aortic valve stenosis. Pulmonic Valve: The pulmonic valve was normal in structure. Pulmonic valve regurgitation is not visualized. Aorta: The aortic root and ascending aorta are structurally normal, with no evidence of dilitation. Venous: The inferior vena cava is dilated in size with greater than 50% respiratory variability, suggesting right atrial pressure of 8 mmHg. IAS/Shunts: No atrial level shunt detected by color flow Doppler.  LEFT VENTRICLE PLAX 2D LVIDd:         5.50 cm  Diastology LVIDs:         3.10 cm  LV e' medial:    7.83 cm/s LV PW:         1.20 cm  LV E/e' medial:  13.3 LV IVS:        1.10 cm  LV e' lateral:   9.90 cm/s LVOT diam:     2.30 cm  LV E/e' lateral: 10.5 LV SV:         103 LV SV Index:   48 LVOT Area:     4.15 cm  RIGHT VENTRICLE             IVC RV S prime:     14.90 cm/s  IVC diam: 2.50 cm TAPSE (M-mode): 2.3 cm LEFT ATRIUM              Index       RIGHT ATRIUM           Index LA diam:        5.00 cm  2.30 cm/m  RA Area:     23.50 cm LA Vol (A2C):   99.7 ml  45.93 ml/m RA Volume:   81.20 ml  37.40 ml/m LA Vol (A4C):   96.7 ml  44.54 ml/m LA Biplane Vol: 101.0 ml 46.52 ml/m  AORTIC VALVE LVOT Vmax:   118.00 cm/s LVOT Vmean:  76.500 cm/s LVOT VTI:    0.249 m  AORTA Ao Root diam: 2.80 cm Ao Asc diam:  3.30 cm MITRAL VALVE MV Area (PHT): 3.08 cm     SHUNTS MV Decel Time: 246 msec     Systemic VTI:  0.25 m MV E  velocity: 104.00 cm/s  Systemic Diam: 2.30 cm MV A velocity: 103.00 cm/s MV E/A ratio:  1.01 Dietrich Pates MD Electronically signed by Dietrich Pates MD Signature Date/Time: 02/23/2021/4:08:10 PM    Final      Medications:     Scheduled Medications:  sodium chloride   Intravenous Once   sodium chloride   Intravenous Once   arformoterol  15 mcg Nebulization BID   budesonide (PULMICORT) nebulizer solution  0.5 mg Nebulization BID   calcium acetate  667 mg Oral TID WC   Chlorhexidine Gluconate Cloth  6 each Topical QHS   pantoprazole (PROTONIX) IV  40 mg Intravenous QHS    Infusions:  aztreonam 2 g (02/24/21 0807)   linezolid (ZYVOX) IV Stopped (02/23/21 2218)    PRN Medications: albuterol, docusate sodium, polyethylene glycol    Assessment/Plan  Acute Hypercarbic Respiratory Failure - Has known severe OSA + COPD . Possible CAP. - Home CPAP machine broken for the last 2 months and cant afford to replace. Will discuss with TOC CM/CSW. - Hypoxemic on arrival to ED. Placed on Bipap. CO2 77.5, bicarb 35.6, PH 7.2 on bicarb.  - now off BiPAP, 4L HFNC - Steroids. + empiric antibiotics.    2. Possible Sepsis -Hypotensive on arrival. Lactic acid 0.8. Procalcitonin 0.4>0.2. WBC 15.4 > 11.1 -Blood Cx obtained 9/15 -On empiric antibiotics   -HF meds held. SBP okay, diastolic low    3. Anemia  -Hgb 6.3 on admit > 1 unit pRBCs -Hgb 6.8 this am. Getting another unit pRBCs -No obvious source.  -11/06/19 Had EGD and colonoscopy earlier this year. Had polyps and diverticulosis. FOBT ordered.    4. A/C Systolic Heart Failure, NICM  -Had LHC 2020 with minimal non obstructive CAD -EF has been < 35% for a few years. He declined ICD. Most recent ECHO EF 25-30% and RV mildly reduced.  - Echo today with hyperdynamic LV function, EF > 70%. RV okay. -BNP not that hight 480.  -CXR with possible vascular congestion.  -Holding diuretics. Does not appear overloaded. -Holding HF meds with hypotension  -BB  on hold with wenckebach AV block   5. AKI  - Creatinine 2.2 > 2.05 > 1.70 - Creatinine baseline 1.1-1.4  - Holding diuretics/entresto    6. Hyperkalemia  - Treated lokelma x 2 yesterday.  - K 5.0 today    Length of Stay: 2  FINCH, LINDSAY N, PA-C  02/24/2021, 8:27 AM  Advanced Heart Failure Team Pager [phone] (M-F; 7a - 5p)  Please contact [facility] for night-coverage after hours (5p -7a ) and weekends on [email or web address]  Patient seen and examined with the above-signed Advanced Practice Provider and/or Housestaff. I personally reviewed laboratory data, imaging studies and relevant notes. I independently examined the patient and formulated the important aspects of the plan. I have edited the note to reflect any of my changes or salient points. I have personally discussed the plan with the patient and/or family.  Feels better today. Remains off bipap. Getting abx. No CP or SOB. BP still soft. Echo with normal EF. Having some wenckebach on monitor.  General:  Sitting up No resp difficulty HEENT: normal Neck: supple. no JVD. Carotids 2+ bilat; no bruits. No lymphadenopathy or thryomegaly appreciated. Cor: PMI nondisplaced. Regular rate & rhythm. No rubs, gallops or murmurs. Lungs: clear Abdomen: obese soft, nontender, nondistended. No hepatosplenomegaly. No bruits or masses. Good bowel sounds. Extremities: no cyanosis, clubbing, rash, edema Neuro: alert & orientedx3, cranial nerves grossly intact. moves all 4 extremities w/o difficulty. Affect pleasant  Stable from cardiac perspective. BP too soft to restart HF meds just yet but will be important to get him back on therapy prior to d/c. Having periods of wenckebach on monitor but asymptomatic. Attempt to restart HF meds over weekend as BP tolerates. Agree with transfer out of ICU. I will see again Monday.   Arvilla Meres, MD  3:12 PM

## 2021-02-24 NOTE — Progress Notes (Addendum)
HF CSW received a consult regarding Mr. Strickland needing a CPAP. 8:54am - CSW received a call from cardiology following up about the patient needing a CPAP machine. 8:57am - CSW reached out to the unit RNCM regarding assistance with the CPAP and the RNCM informed the CSW to contact [employer] regarding the CPAP. 11:25am - CSW reached out to [employer] regarding the patient's need for a CPAP. [employer] reported they will be in touch for further follow up if needed.   Jc Veron, MSW, LCSWA [phone] Heart Failure Social Worker

## 2021-02-24 NOTE — Consult Note (Addendum)
Referring Provider: Dr. Cyril Mourning Primary Care Physician:  Shirlean Mylar, MD Primary Gastroenterologist:  Gentry Fitz  Reason for Consultation:  anemia  HPI: Kristopher [name] is a 70 y.o. male with chronic systolic CHF, nonischemic cardiomyopathy, COPD, AKI and pulmonary hypertension evaluated in the hospital intially for  hypoxia, AMS, and hypotension being followed by Critical care presents for consultation of anemia. Patient had previous work up for anemia 2021 with EGD/colonoscopy.   Had drop in hemoglobin this visit from 9.2 to 6.8, has been transfused 1 unit PRBC, hemoglobin now 8.1. Look back his hemoglobin has been around 8 since 2021, in 2020 hemoglobin was 12.1.  11/2020 BUN 16, Cr 1.19, on admission increased BUN 30 and Cr 2.23 Fecal occult blood positive 10/2019  Colonoscopy 11/08/2019 for iron def anemia with Dr. Levora Angel showed 9 polyps 2-10 mm in size- pathology tubular adenoma, diverticulosis, medium internal hemorrhoids and recall 3 years.  Endoscopy 11/07/2019 showed Medium-sized hiatal hernia and Erosive gastropathy with no bleeding and no stigmata of recent bleeding. Positive for H pylori  Patient had shortness of breath and cough for last 2-3 weeks which prompted admission.  Patient is on omeprazole 40 mg once daily for GERD, states this is well controlled. Denies dysphagia, odynophagia. Denies melena, hematochezia.  Has had soft normal BM's per patient, has had every other day last 2 weeks due to decreased PO intake.  He does have early satiety, denies AB bloating.  He does take 325mg  ASA at least 3 x a week for headaches, otherwise not on blood thinner, no NSAIDS. Denies ETOH or drug use.  Denies family history of stomach or colon cancer.   Past Medical History:  Diagnosis Date   Acute combined systolic and diastolic heart failure (HCC) 08/11/2016   Acute esophagitis 08/17/2016   Acute upper GI bleed 08/10/2016   Asthma    CAD (coronary artery disease) 08/16/2016   CHF  (congestive heart failure) (HCC)    COPD (chronic obstructive pulmonary disease) (HCC) 11/06/2019   DCM (dilated cardiomyopathy) (HCC)    Dyslipidemia 11/06/2019   Dyspnea    HTN (hypertension)    LBBB (left bundle branch block) 08/10/2016   Lymphadenopathy 08/17/2016   OSA (obstructive sleep apnea) 11/18/2018   [place] home sleep study revealed Severe Obstructive Sleep Apnea with AHI 65.8/hr. Central sleep apnea was noted with an pAHIc of 11.4/hr. % Cheyne Stokes respirations was 10.3%. Nocturnal Hypoxemia was noted with O2 saturations as low as 72%. Time spent with Oxygen desaturations < 88% was 97.6 minutes.   PUD (peptic ulcer disease) 08/17/2016   Pulmonary HTN (HCC) 08/17/2016    Past Surgical History:  Procedure Laterality Date   BIOPSY  11/07/2019   Procedure: BIOPSY;  Surgeon: [name], MD;  Location: [facility];  Service: Gastroenterology;;   BIOPSY  11/08/2019   Procedure: BIOPSY;  Surgeon: [name], MD;  Location: [facility]OS[facility];  Service: Gastroenterology;;   COLONOSCOPY WITH PROPOFOL N/A 11/08/2019   Procedure: COLONOSCOPY WITH PROPOFOL;  Surgeon: [name], MD;  Location: [facility];  Service: Gastroenterology;  Laterality: N/A;   ESOPHAGOGASTRODUODENOSCOPY (EGD) WITH PROPOFOL Left 08/11/2016   Procedure: ESOPHAGOGASTRODUODENOSCOPY (EGD) WITH PROPOFOL;  Surgeon: Willis Modena, MD;  Location: [facility] ENDOSCOPY;  Service: Endoscopy;  Laterality: Left;   ESOPHAGOGASTRODUODENOSCOPY (EGD) WITH PROPOFOL Left 11/07/2019   Procedure: ESOPHAGOGASTRODUODENOSCOPY (EGD) WITH PROPOFOL;  Surgeon: [name], MD;  Location: [facility]OS[facility];  Service: Gastroenterology;  Laterality: Left;   POLYPECTOMY  11/08/2019   Procedure: POLYPECTOMY;  Surgeon: [name], MD;  Location: [facility]OS[facility];  Service: Gastroenterology;;   RIGHT/LEFT HEART CATH AND CORONARY ANGIOGRAPHY N/A 08/15/2016   Procedure: Right/Left Heart Cath and Coronary Angiography;  Surgeon: Corky Crafts, MD;   Location: [facility] INVASIVE CV LAB;  Service: Cardiovascular;  Laterality: N/A;   RIGHT/LEFT HEART CATH AND CORONARY ANGIOGRAPHY N/A 08/04/2018   Procedure: RIGHT/LEFT HEART CATH AND CORONARY ANGIOGRAPHY;  Surgeon: Dolores Patty, MD;  Location: [facility] INVASIVE CV LAB;  Service: Cardiovascular;  Laterality: N/A;    Prior to Admission medications   Medication Sig Start Date End Date Taking? Authorizing Provider  acetaminophen (TYLENOL) 325 MG tablet Take 650 mg by mouth every 6 (six) hours as needed for mild pain.    Yes [provider]  albuterol (PROVENTIL) (2.5 MG/3ML) 0.083% nebulizer solution Inhale 3 mLs into the lungs in the morning and at bedtime. 06/20/18  Yes [provider]  aspirin 81 MG chewable tablet Chew 1 tablet (81 mg total) by mouth daily. 09/15/18  Yes Alford Highland, NP  atorvastatin (LIPITOR) 40 MG tablet Take 1 tablet (40 mg total) by mouth daily at 6 PM. Needs appt for further refills 11/01/20  Yes Bensimhon, Bevelyn Buckles, MD  carvedilol (COREG) 6.25 MG tablet Take 1.5 tablets (9.375 mg total) by mouth 2 (two) times daily with a meal for 30 days. 12/04/18 02/22/21 Yes Bensimhon, Bevelyn Buckles, MD  ferrous sulfate 325 (65 FE) MG tablet Take 325 mg by mouth daily with breakfast.   Yes [provider]  furosemide (LASIX) 40 MG tablet Take 2 tablets (80 mg total) by mouth 2 (two) times daily. 02/11/20  Yes Bensimhon, Bevelyn Buckles, MD  omeprazole (PRILOSEC) 40 MG capsule Take 40 mg by mouth daily as needed (gerd).    Yes [provider]  potassium chloride (KLOR-CON) 10 MEQ tablet Take 1 tablet (10 mEq total) by mouth daily. 06/29/19  Yes Bensimhon, Bevelyn Buckles, MD  sacubitril-valsartan (ENTRESTO) 97-103 MG Take 1 tablet by mouth 2 (two) times daily. 01/20/20  Yes Bensimhon, Bevelyn Buckles, MD  spironolactone (ALDACTONE) 25 MG tablet Take 1 tablet (25 mg total) by mouth daily. 12/15/19  Yes Bensimhon, Bevelyn Buckles, MD  SYMBICORT 160-4.5 MCG/ACT inhaler Inhale 1 puff into the lungs in the  morning and at bedtime. 09/08/19  Yes [provider]  ipratropium (ATROVENT) 0.02 % nebulizer solution Take 2.5 mLs (0.5 mg total) by nebulization 2 (two) times daily. Patient not taking: Reported on 02/22/2021 08/08/18   Zigmund Daniel., MD    Scheduled Meds:  sodium chloride   Intravenous Once   sodium chloride   Intravenous Once   arformoterol  15 mcg Nebulization BID   azithromycin  500 mg Oral Daily   budesonide (PULMICORT) nebulizer solution  0.5 mg Nebulization BID   Chlorhexidine Gluconate Cloth  6 each Topical QHS   pantoprazole (PROTONIX) IV  40 mg Intravenous QHS   Continuous Infusions:  cefTRIAXone (ROCEPHIN)  IV Stopped (02/24/21 1213)   PRN Meds:.albuterol, docusate sodium, polyethylene glycol  Allergies as of 02/22/2021 - Review Complete 02/22/2021  Allergen Reaction Noted   Penicillins  02/10/2016    Family History  Problem Relation Age of Onset   Hypertension Mother    Hypertension Sister     Social History   Socioeconomic History   Marital status: Single    Spouse name: Not on file   Number of children: Not on file   Years of education: Not on file   Highest education level: Not on file  Occupational History   Occupation: retired  Tobacco Use   Smoking status: Former    Packs/day: 0.50    Years: 44.00    Pack years: 22.00    Types: Cigarettes    Quit date: 06/11/1998    Years since quitting: 22.7   Smokeless tobacco: Never  Vaping Use   Vaping Use: Never used  Substance and Sexual Activity   Alcohol use: Not Currently    Comment: h/o heavy use   Drug use: Not Currently   Sexual activity: Not on file  Other Topics Concern   Not on file  Social History Narrative   Not on file   Social Determinants of Health   Financial Resource Strain: Not on file  Food Insecurity: Not on file  Transportation Needs: Not on file  Physical Activity: Not on file  Stress: Not on file  Social Connections: Not on file  Intimate Partner  Violence: Not on file    Review of Systems:  Review of Systems  Constitutional:  Positive for malaise/fatigue. Negative for chills and fever.  Respiratory:  Positive for cough and shortness of breath.   Cardiovascular:  Negative for chest pain.  Gastrointestinal:  Negative for abdominal pain, blood in stool, constipation, diarrhea, heartburn, melena, nausea and vomiting.  Musculoskeletal:  Negative for falls.  Psychiatric/Behavioral:  Negative for depression.     Physical Exam: Vital signs: Vitals:   02/24/21 1300 02/24/21 1400  BP: (!) 118/42 (!) 94/50  Pulse: 71 70  Resp: (!) 22 (!) 26  Temp:    SpO2: 95% 98%   Last BM Date: 02/21/21  Physical Exam Constitutional:      Comments: Obese sitting comfortably in bed with 3 L [place] in place.  HENT:     Mouth/Throat:     Comments: Poor dentition.  Cardiovascular:     Rate and Rhythm: Normal rate and regular rhythm.     Comments: Decreased heart sounds Pulmonary:     Effort: Pulmonary effort is normal.     Breath sounds: No wheezing, rhonchi or rales.     Comments: 3L  Abdominal:     General: Abdomen is protuberant. Bowel sounds are normal. There is no distension.     Palpations: Abdomen is soft. There is no hepatomegaly or splenomegaly.     Tenderness: There is no abdominal tenderness. There is no guarding or rebound.  Musculoskeletal:        General: Normal range of motion.     Right lower leg: No edema.     Left lower leg: No edema.  Skin:    General: Skin is dry.  Neurological:     Mental Status: He is oriented to person, place, and time.     Cranial Nerves: No cranial nerve deficit.  Psychiatric:        Mood and Affect: Mood normal.        Behavior: Behavior normal.        GI:  Lab Results: Recent Labs    02/22/21 1727 02/22/21 1915 02/23/21 0426 02/23/21 0859 02/23/21 1354 02/24/21 0159 02/24/21 1046  WBC 15.0*  --  15.4*  --   --  11.1*  --   HGB 6.4*   < > 7.4*   < > 9.2* 6.8* 8.1*  HCT 26.3*    < > 29.2*   < > 27.0* 26.0* 30.6*  PLT 675*  --  619*  --   --  541*  --    < > = values in  this interval not displayed.   BMET Recent Labs    02/22/21 1727 02/22/21 1915 02/23/21 0426 02/23/21 0859 02/23/21 1354 02/24/21 0159  NA 137   < > 138 137 137 137  K 4.7   < > 5.5* 5.3* 5.1 5.0  CL 97*  --  97*  --   --  94*  CO2 29  --  31  --   --  32  GLUCOSE 99  --  136*  --   --  100*  BUN 30*  --  32*  --   --  38*  CREATININE 2.23*  --  2.05*  --   --  1.70*  CALCIUM 8.7*  --  8.7*  --   --  9.3   < > = values in this interval not displayed.   LFT Recent Labs    02/22/21 1727  PROT 6.9  ALBUMIN 3.7  AST 12*  ALT 6  ALKPHOS 55  BILITOT 0.6   PT/INR No results for input(s): LABPROT, INR in the last 72 hours.  Studies/Results: DG Chest 2 View  Result Date: 02/22/2021 CLINICAL DATA:  Shortness of breath. EXAM: CHEST - 2 VIEW COMPARISON:  Chest x-ray 07/30/2018. FINDINGS: The heart is enlarged. There is central pulmonary vascular congestion. There is no focal lung infiltrate, pleural effusion or pneumothorax. There are minimal atelectatic changes in the lung bases. No acute fractures are seen. Degenerative changes affect the spine. IMPRESSION: Cardiomegaly with central pulmonary vascular congestion. Electronically Signed   By: Darliss Cheney M.D.   On: 02/22/2021 17:55   DG Chest Port 1 View  Result Date: 02/24/2021 CLINICAL DATA:  Acute respiratory failure EXAM: PORTABLE CHEST 1 VIEW COMPARISON:  Chest radiograph 02/22/2021 FINDINGS: The heart is mildly enlarged, unchanged. The mediastinal contours are stable. Lung volumes are low. There is a small left pleural effusion with adjacent airspace disease, similar to the study from two days ago. There is no new focal airspace disease. There is no significant right pleural effusion. There is no pneumothorax. There is no acute osseous abnormality. IMPRESSION: Low lung volumes with a small left pleural effusion and adjacent airspace  disease, not significantly changed. Electronically Signed   By: Lesia Hausen M.D.   On: 02/24/2021 08:32   ECHOCARDIOGRAM COMPLETE  Result Date: 02/23/2021    ECHOCARDIOGRAM REPORT   Patient Name:   Kristopher [name] [facility] Date of Exam: 02/23/2021 Medical Rec #:  [id number]     Height:       67.0 in Accession #:    [id number]    Weight:       236.3 lb Date of Birth:  [birth year]     BSA:          2.171 m Patient Age:    69 years      BP:           93/53 mmHg Patient Gender: M             HR:           62 bpm. Exam Location:  Inpatient Procedure: 2D Echo Indications:    acute respiratory distress  History:        Patient has prior history of Echocardiogram examinations, most                 recent 11/13/2018. Cardiomyopathy, CAD, COPD and Pulmonary HTN,                 Arrythmias:LBBB, Signs/Symptoms:Shortness of Breath; Risk  Factors:Hypertension, Dyslipidemia and Sleep Apnea.  Sonographer:    Lauren Pennington RDCS Referring Phys: [id number] PAUL W HOFFMAN  Sonographer Comments: Technically difficult study due to poor echo windows. Image acquisition challenging due to patient body habitus. IMPRESSIONS  1. Left ventricular ejection fraction, by estimation, is 70 to 75%. The left ventricle has hyperdynamic function. The left ventricle has no regional wall motion abnormalities. There is mild left ventricular hypertrophy. Left ventricular diastolic parameters are indeterminate.  2. Right ventricular systolic function is normal. The right ventricular size is normal.  3. Left atrial size was moderately dilated.  4. Right atrial size was moderately dilated.  5. The mitral valve is normal in structure. Mild mitral valve regurgitation.  6. The aortic valve is tricuspid. Aortic valve regurgitation is mild. Mild aortic valve sclerosis is present, with no evidence of aortic valve stenosis.  7. The inferior vena cava is dilated in size with >50% respiratory variability, suggesting right atrial pressure of 8 mmHg. FINDINGS  Left  Ventricle: Left ventricular ejection fraction, by estimation, is 70 to 75%. The left ventricle has hyperdynamic function. The left ventricle has no regional wall motion abnormalities. The left ventricular internal cavity size was normal in size. There is mild left ventricular hypertrophy. Left ventricular diastolic parameters are indeterminate. Right Ventricle: The right ventricular size is normal. Right vetricular wall thickness was not assessed. Right ventricular systolic function is normal. Left Atrium: Left atrial size was moderately dilated. Right Atrium: Right atrial size was moderately dilated. Pericardium: Trivial pericardial effusion is present. Mitral Valve: The mitral valve is normal in structure. Mild mitral valve regurgitation. Tricuspid Valve: The tricuspid valve is normal in structure. Tricuspid valve regurgitation is mild. Aortic Valve: The aortic valve is tricuspid. Aortic valve regurgitation is mild. Mild aortic valve sclerosis is present, with no evidence of aortic valve stenosis. Pulmonic Valve: The pulmonic valve was normal in structure. Pulmonic valve regurgitation is not visualized. Aorta: The aortic root and ascending aorta are structurally normal, with no evidence of dilitation. Venous: The inferior vena cava is dilated in size with greater than 50% respiratory variability, suggesting right atrial pressure of 8 mmHg. IAS/Shunts: No atrial level shunt detected by color flow Doppler.  LEFT VENTRICLE PLAX 2D LVIDd:         5.50 cm  Diastology LVIDs:         3.10 cm  LV e' medial:    7.83 cm/s LV PW:         1.20 cm  LV E/e' medial:  13.3 LV IVS:        1.10 cm  LV e' lateral:   9.90 cm/s LVOT diam:     2.30 cm  LV E/e' lateral: 10.5 LV SV:         103 LV SV Index:   48 LVOT Area:     4.15 cm  RIGHT VENTRICLE             IVC RV S prime:     14.90 cm/s  IVC diam: 2.50 cm TAPSE (M-mode): 2.3 cm LEFT ATRIUM              Index       RIGHT ATRIUM           Index LA diam:        5.00 cm  2.30 cm/m   RA Area:     23.50 cm LA Vol (A2C):   99.7 ml  45.93 ml/m RA Volume:   81.20 ml  37.40   ml/m LA Vol (A4C):   96.7 ml  44.54 ml/m LA Biplane Vol: 101.0 ml 46.52 ml/m  AORTIC VALVE LVOT Vmax:   118.00 cm/s LVOT Vmean:  76.500 cm/s LVOT VTI:    0.249 m  AORTA Ao Root diam: 2.80 cm Ao Asc diam:  3.30 cm MITRAL VALVE MV Area (PHT): 3.08 cm     SHUNTS MV Decel Time: 246 msec     Systemic VTI:  0.25 m MV E velocity: 104.00 cm/s  Systemic Diam: 2.30 cm MV A velocity: 103.00 cm/s MV E/A ratio:  1.01 Dietrich Pates MD Electronically signed by Dietrich Pates MD Signature Date/Time: 02/23/2021/4:08:10 PM    Final     Impression and Plan Anemia HGB 9.2 to 6.8, has been transfused 1 unit PRBC, hemoglobin 8.1. Appears to have chronic anemia since 2021 11/2020 BUN 16, Cr 1.19, on admission increased BUN 30 and Cr 2.23 Likely UGI Endoscopy 11/07/2019 showed Medium-sized hiatal hernia and Erosive gastropathy with no bleeding and no stigmata of recent bleeding. Positive for H pylori Patient lost to follow up, unknown if H pylori treated.  325 ASA use for headaches  Multiple comorbidities for this hospitalization chronic systolic CHF, nonischemic cardiomyopathy, COPD, AKI and pulmonary hypertension with current hypoxia and hypotension On CPAP only at night, on 3 L [place] high flow, BP continues to be low/soft but patient appears to be stable enough at this time for EGD Discussed case in primate messenger with Dr. Isaiah Serge, patient had normal echo and being moved from ICU today, should be stable enough for EGD.   Plan for EGD tomorrow. I thoroughly discussed the procedure to include nature, alternatives, benefits, and risks including but not limited to bleeding, perforation, infection, anesthesia/cardiac and pulmonary complications. Patient provides understanding and gave verbal consent to proceed. Start Protonix 40 mg IV BID. Clear liquid diet, NPO at midnight.   Continue to monitor H&H with transfusion as needed to maintain  hemoglobin greater than 7.   [facility] will follow.    LOS: 2 days   Doree Albee  PA-C 02/24/2021, 2:19 PM  Contact #  [phone]

## 2021-02-24 NOTE — Progress Notes (Addendum)
NAME:  Kristopher [name], MRN:  [id number], DOB:  07/25/1950, LOS: 2 ADMISSION DATE:  02/22/2021, CONSULTATION DATE:  02/24/2021  REFERRING MD:  Tegeler, EDP, CHIEF COMPLAINT:  hypotension, resp distres on bipap   History of Present Illness:  70 year old with chronic systolic CHF, nonischemic cardiomyopathy and pulmonary hypertension brought in by EMS with shortness of breath for 3 weeks, complains of cough with white sputum production.  Noted to be hypoxic to 76% and initially improved with 3 L oxygen but later on became somnolent in the ED and placed on BiPAP for VBG of 7.2 7/78/172.  ED evaluation showed mild leukocytosis of 15 K, drop in hemoglobin from 7.9 in June to 6.4 ,Slight high BNP of 480 and rise of creatinine from 1.2-2.2 Mental status improved with BiPAP but he was also mildly hypotensive, due to multiple critical issues, PCCM consulted for admission. Chest x-ray showed cardiomegaly with mild interstitial prominence and small effusions He reports non compliance with his CPAP which broke down a few months ago  Pertinent  Medical History  HFrEF, NICM EF 30-35% , declined ICD , LBBB PH Severe OSA on CPAP ? COPD quit smoking 20 years ago  Significant Hospital Events: Including procedures, antibiotic start and stop dates in addition to other pertinent events   10/2019 EGD negative, colonoscopy showed hemorrhoids and polyps 9/14- Admit 1 unit PRBC  Interim History / Subjective:   Has remained off BiPAP.  Hemodynamically stable Hemoglobin low and transfused 1 unit PRBC  Objective   Blood pressure (!) 107/53, pulse 61, temperature 98.1 F (36.7 C), temperature source Oral, resp. rate (!) 21, height 5\' 7"  (1.702 m), weight 107.2 kg, SpO2 91 %.    FiO2 (%):  [40 %-94 %] 40 %   Intake/Output Summary (Last 24 hours) at 02/24/2021 1006 Last data filed at 02/24/2021 0900 Gross per 24 hour  Intake 1309.24 ml  Output 2270 ml  Net -960.76 ml   Filed Weights   02/22/21 1727 02/22/21  2245 02/23/21 0309  Weight: 111.1 kg 107 kg 107.2 kg    Examination: Blood pressure (!) 104/58, pulse (!) 58, temperature 98.1 F (36.7 C), temperature source Oral, resp. rate (!) 28, height 5\' 7"  (1.702 m), weight 107.2 kg, SpO2 96 %. Gen:      No acute distress, obese HEENT:  EOMI, sclera anicteric Neck:     No masses; no thyromegaly Lungs:    Clear to auscultation bilaterally; normal respiratory effort CV:         Regular rate and rhythm; no murmurs Abd:      + bowel sounds; soft, non-tender; no palpable masses, no distension Ext:    No edema; adequate peripheral perfusion Skin:      Warm and dry; no rash Neuro: Awake, oriented  Lab/imaging reviewed Significant for improvement in creatinine to 1.7, PCT low at 0.18 WBC 11.1, hemoglobin 6.8 Chest x-ray with small effusion, small airspace disease.  Resolved Hospital Problem list      Assessment & Plan:  Multiple issues ongoing without unifying diagnosis -he has acute hypercarbic respiratory failure with AKI does not appear to be in obvious fluid overload, mildly hypertensive, with drop in hemoglobin to 6.4 without obvious GI bleed.  He reports compliance with his diuretics and is also on Entresto.  He has mild leukocytosis and pneumonia cannot be excluded  Acute hypercarbic respiratory failure - Improved.  Continue BiPAP as needed and during night He has been noncompliant with noninvasive ventilation at home for 2 months  due to equipment breakdown.  This will need to be reordered before he can go home On aztreonam and linezolid as he has penicillin allergy.  On discussion with patient this was a reaction suspected and he does not recall the exact details Will change to ceftriaxone and azithromycin to complete 7 days of Coverage Continue nebs  AKI Hyperkalemia Monitor labs Holding entresto and spironolactone   Chronic systolic heart failure  Wenckebach's Hold meds as above, will hold Coreg until heart rate improves. Heart  failure on board  Anemia,  history of iron deficiency anemia, gastric ulcer Previous EGD colonoscopy was nondiagnostic in 2021 S/p 2 units PRBC during this admission.  He is hemodynamically stable We will consult gastroenterology Continue PPI, follow labs  Stable for transfer out of ICU and to hospitalist service  Best Practice (right click and "Reselect all SmartList Selections" daily)   Diet/type: Regular consistency (see orders) DVT prophylaxis: SCD GI prophylaxis: PPI Lines: N/A Foley:  N/A Code Status:  full code Last date of multidisciplinary goals of care discussion [NA]  Critical care time: NA   Chilton Greathouse MD [facility] See Amion for pager  If no response to pager , please call [phone] until 7pm After 7:00 pm call Elink  [phone] 02/24/2021, 10:06 AM

## 2021-02-25 ENCOUNTER — Inpatient Hospital Stay (HOSPITAL_COMMUNITY): Payer: Medicare Other | Admitting: Certified Registered Nurse Anesthetist

## 2021-02-25 ENCOUNTER — Encounter (HOSPITAL_COMMUNITY)
Admission: EM | Disposition: A | Discharge status: Home / Self Care | Payer: Self-pay | Source: Home / Self Care | Attending: Family Medicine

## 2021-02-25 ENCOUNTER — Encounter (HOSPITAL_COMMUNITY): Payer: Self-pay | Admitting: Pulmonary Disease

## 2021-02-25 DIAGNOSIS — J9601 Acute respiratory failure with hypoxia: Secondary | ICD-10-CM

## 2021-02-25 DIAGNOSIS — I420 Dilated cardiomyopathy: Secondary | ICD-10-CM | POA: Diagnosis not present

## 2021-02-25 DIAGNOSIS — J9602 Acute respiratory failure with hypercapnia: Secondary | ICD-10-CM

## 2021-02-25 DIAGNOSIS — G4733 Obstructive sleep apnea (adult) (pediatric): Secondary | ICD-10-CM | POA: Diagnosis not present

## 2021-02-25 DIAGNOSIS — D649 Anemia, unspecified: Secondary | ICD-10-CM

## 2021-02-25 HISTORY — PX: BIOPSY: SHX5522

## 2021-02-25 HISTORY — PX: ESOPHAGOGASTRODUODENOSCOPY (EGD) WITH PROPOFOL: SHX5813

## 2021-02-25 LAB — BASIC METABOLIC PANEL
Anion gap: 6 (ref 5–15)
Anion gap: 7 (ref 5–15)
BUN: 21 mg/dL (ref 8–23)
BUN: 12 mg/dL (ref 8–23)
CO2: 35 mmol/L — ABNORMAL HIGH (ref 22–32)
CO2: 37 mmol/L — ABNORMAL HIGH (ref 22–32)
Calcium: 9.3 mg/dL (ref 8.9–10.3)
Calcium: 9.1 mg/dL (ref 8.9–10.3)
Chloride: 98 mmol/L (ref 98–111)
Chloride: 94 mmol/L — ABNORMAL LOW (ref 98–111)
Creatinine, Ser: 1.24 mg/dL (ref 0.61–1.24)
Creatinine, Ser: 1.04 mg/dL (ref 0.61–1.24)
GFR, Estimated: >60 mL/min (ref >60)
GFR, Estimated: >60 mL/min (ref >60)
Glucose, Bld: 99 mg/dL (ref 70–99)
Glucose, Bld: 106 mg/dL — ABNORMAL HIGH (ref 70–99)
Potassium: 5.4 mmol/L — ABNORMAL HIGH (ref 3.5–5.1)
Potassium: 4.4 mmol/L (ref 3.5–5.1)
Sodium: 139 mmol/L (ref 135–145)
Sodium: 138 mmol/L (ref 135–145)

## 2021-02-25 LAB — TYPE AND SCREEN
ABO/RH(D): B POS
Antibody Screen: NEGATIVE
Unit division: 0
Unit division: 0

## 2021-02-25 LAB — BPAM RBC
Blood Product Expiration Date: 202210062359
Blood Product Expiration Date: 202210062359
ISSUE DATE / TIME: 202209142042
ISSUE DATE / TIME: 202209160636
Unit Type and Rh: 7300
Unit Type and Rh: 7300

## 2021-02-25 LAB — CBC
HCT: 32.2 % — ABNORMAL LOW (ref 39.0–52.0)
Hemoglobin: 8.3 g/dL — ABNORMAL LOW (ref 13.0–17.0)
MCH: 20.5 pg — ABNORMAL LOW (ref 26.0–34.0)
MCHC: 25.8 g/dL — ABNORMAL LOW (ref 30.0–36.0)
MCV: 79.5 fL — ABNORMAL LOW (ref 80.0–100.0)
Platelets: 523 10*3/uL — ABNORMAL HIGH (ref 150–400)
RBC: 4.05 MIL/uL — ABNORMAL LOW (ref 4.22–5.81)
RDW: 19.9 % — ABNORMAL HIGH (ref 11.5–15.5)
WBC: 13.8 10*3/uL — ABNORMAL HIGH (ref 4.0–10.5)
nRBC: 0.5 % — ABNORMAL HIGH (ref 0.0–0.2)

## 2021-02-25 LAB — MAGNESIUM: Magnesium: 2.8 mg/dL — ABNORMAL HIGH (ref 1.7–2.4)

## 2021-02-25 LAB — PHOSPHORUS: Phosphorus: 3.2 mg/dL (ref 2.5–4.6)

## 2021-02-25 SURGERY — ESOPHAGOGASTRODUODENOSCOPY (EGD) WITH PROPOFOL
Anesthesia: Monitor Anesthesia Care

## 2021-02-25 MED ORDER — HYDROCORTISONE 1 % EX CREA
TOPICAL_CREAM | Freq: Two times a day (BID) | CUTANEOUS | Status: DC | PRN
  Filled 2021-02-25: qty 28.35
  Filled 2021-02-25: qty 28

## 2021-02-25 MED ORDER — SODIUM ZIRCONIUM CYCLOSILICATE 10 G PO PACK
10.0000 g | PACK | Freq: Four times a day (QID) | ORAL | Status: AC
  Administered 2021-02-25 (×2): 10 g via ORAL
  Filled 2021-02-25 (×2): qty 1

## 2021-02-25 MED ORDER — LIDOCAINE 2% (20 MG/ML) 5 ML SYRINGE
INTRAMUSCULAR | Status: DC | PRN
  Administered 2021-02-25: 40 mg via INTRAVENOUS

## 2021-02-25 MED ORDER — LACTATED RINGERS IV SOLN: INTRAVENOUS | Status: DC | PRN

## 2021-02-25 MED ORDER — PROPOFOL 500 MG/50ML IV EMUL
INTRAVENOUS | Status: DC | PRN
  Administered 2021-02-25: 150 ug/kg/min via INTRAVENOUS

## 2021-02-25 MED ORDER — SODIUM ZIRCONIUM CYCLOSILICATE 10 G PO PACK: 10.0000 g | PACK | Freq: Two times a day (BID) | ORAL | Status: DC

## 2021-02-25 MED ORDER — PROPOFOL 10 MG/ML IV BOLUS
INTRAVENOUS | Status: DC | PRN
  Administered 2021-02-25: 20 mg via INTRAVENOUS

## 2021-02-25 MED ORDER — PHENYLEPHRINE 40 MCG/ML (10ML) SYRINGE FOR IV PUSH (FOR BLOOD PRESSURE SUPPORT)
PREFILLED_SYRINGE | INTRAVENOUS | Status: DC | PRN
  Administered 2021-02-25: 80 ug via INTRAVENOUS

## 2021-02-25 SURGICAL SUPPLY — 15 items

## 2021-02-25 NOTE — Progress Notes (Signed)
SATURATION QUALIFICATIONS: (This note is used to comply with regulatory documentation for home oxygen)  Patient Saturations on Room Air at Rest  75%  Patient Saturations on Room Air while Ambulating = unable to ambulate on RA due to oxygen sat 75% at rest  Patient Saturations on 4 Liters of oxygen while Ambulating = 96%  Please briefly explain why patient needs home oxygen: Heart failure patient and needs CPAP at night

## 2021-02-25 NOTE — Progress Notes (Signed)
Kristopher [name] presents with acute on chronic hypoxic and hypercarbic respiratory failure with hypercapnia secondary to COPD. The use of the NIV will treat patients high PC02 levels (65.2 with elevated Bicarbonate of 34.0 on 02/23/21) and can reduce risk of exacerbations and future hospitalizations (Kristopher [name] has had 1 hospital admission and 1 separate ED visit in the past 3 months)  when used at night and during the day.  All alternate devices [phone] and [id number]) have been proven ineffective to provide essential volume control necessary to maintain acceptable CO2 levels.  Patient has been on BiPAP since admission and continues to show elevated CO2 levels.  An NIV with AVAPS AE is necessary to prevent patient harm.  Interruption or failure to provide NIV would quickly lead to exacerbation of the patients condition, hospital admission, and likely harm to the patient. Continued use is preferred.  Patient is able to protect their airways and clear secretions on their own.

## 2021-02-25 NOTE — Progress Notes (Signed)
SATURATION QUALIFICATIONS: (This note is used to comply with regulatory documentation for home oxygen)  Patient Saturations on Room Air at Rest = 86%  Patient Saturations on Room Air while Ambulating = 84%  Patient Saturations on 2 Liters of oxygen while Ambulating = 98%  Please briefly explain why patient needs home oxygen: CHF/COPD exacerbation

## 2021-02-25 NOTE — TOC Transition Note (Addendum)
Transition of Care [facility]) - CM/SW Discharge Note   Patient Details  Name: Kristopher [name] MRN: [id number] Date of Birth: Nov 12, 1950  Transition of [facility] CM/SW Contact:  Bess Kinds, RN Phone Number: [phone] 02/25/2021, 3:03 PM   Clinical Narrative:     Notified of patient needing home oxygen. Spoke with patient at the bedside to verify he does not have home oxygen. DME order placed along with qualifying note.  Correction: Referral to [employer] for home oxygen and for NIV.   UPDATE: Order form completed and signed by provider for NIV - faxed to [employer] for submission for insurance authorization. If patient discharges prior to insurance authorization, [employer] will follow up with patient at home.   Final next level of care: Home/Self Care Barriers to Discharge: No Barriers Identified   Patient Goals and CMS Choice Patient states their goals for this hospitalization and ongoing recovery are:: return home CMS Medicare.gov Compare Post Acute Care list provided to:: Patient Choice offered to / list presented to : Patient  Discharge Placement                       Discharge Plan and Services                DME Arranged: Oxygen [employer] Date DME Agency Contacted: 02/25/21 Time DME Agency Contacted: [phone] HH Arranged: NA HH Agency: NA        Social Determinants of Health (SDOH) Interventions     Readmission Risk Interventions No flowsheet data found.

## 2021-02-25 NOTE — Evaluation (Signed)
Occupational Therapy Evaluation Patient Details Name: Kristopher [name] MRN: [id number] DOB: 1950/12/30 Today's Date: 02/25/2021   History of Present Illness Pt adm 9/14 with acute hypercarbic respiratory failure with AKI. Pt also with anemia. PMH - cardiomyopathy, chf, HTN, DM, COPD, OSA, obestity   Clinical Impression   Pt typically independent at home in ADL and transfers. Today able to demonstrate both upper body and lower body ADL, standing, transfers at independent level. Did educate on how to use 3 in1 as shower chair for shower safety as well as reinforcement of using pill box for medicine management coming home from hospital. Daughter and son present during session. Education complete, Pt at baseline for ADL and Pt/family with no further questions regarding OT. OT to sign off at this time.       Recommendations for follow up therapy are one component of a multi-disciplinary discharge planning process, led by the attending physician.  Recommendations may be updated based on patient status, additional functional criteria and insurance authorization.   Follow Up Recommendations  No OT follow up;Other (comment) (Daughter asking about follow up RN care for medicine management)    Equipment Recommendations  None recommended by OT (Pt has appropriate DME)    Recommendations for Other Services       Precautions / Restrictions Precautions Precautions: None Restrictions Weight Bearing Restrictions: No      Mobility Bed Mobility               General bed mobility comments: Pt up in chair    Transfers Overall transfer level: Independent Equipment used: None                  Balance Overall balance assessment: Mild deficits observed, not formally tested                                         ADL either performed or assessed with clinical judgement   ADL Overall ADL's : At baseline                                       General  ADL Comments: able to demonstrate LB and UB ADL, standing ADL, educated on shower safety     Vision Baseline Vision/History: 1 Wears glasses Ability to See in Adequate Light: 0 Adequate Patient Visual Report: No change from baseline       Perception     Praxis      Pertinent Vitals/Pain Pain Assessment: No/denies pain     Hand Dominance Right   Extremity/Trunk Assessment Upper Extremity Assessment Upper Extremity Assessment: Overall WFL for tasks assessed   Lower Extremity Assessment Lower Extremity Assessment: Defer to PT evaluation       Communication Communication Communication: No difficulties   Cognition Arousal/Alertness: Awake/alert Behavior During Therapy: WFL for tasks assessed/performed Overall Cognitive Status: History of cognitive impairments - at baseline                                     General Comments  Educated on medicine management strategies,    Exercises     Shoulder Instructions      Home Living Family/patient expects to be discharged to:: Private residence Living Arrangements: Other relatives (cousin)  Available Help at Discharge: Family;Available PRN/intermittently Type of Home: House Home Access: Stairs to enter [employer] of Steps: 1 Entrance Stairs-Rails: None Home Layout: One level     Bathroom Shower/Tub: Walk-in shower;Tub/shower unit   Bathroom Toilet: Standard     Home Equipment: Environmental consultant - 4 wheels;Grab bars - tub/shower          Prior Functioning/Environment Level of Independence: Independent                 OT Problem List: Cardiopulmonary status limiting activity;Obesity      OT Treatment/Interventions:      OT Goals(Current goals can be found in the care plan section) Acute Rehab OT Goals Patient Stated Goal: get home for his bday OT Goal Formulation: With patient/family Time For Goal Achievement: 03/04/21 Potential to Achieve Goals: Good  OT Frequency:     Barriers to  D/C:            Co-evaluation              AM-PAC OT "6 Clicks" Daily Activity     Outcome Measure Help from another person eating meals?: None Help from another person taking care of personal grooming?: None Help from another person toileting, which includes using toliet, bedpan, or urinal?: None Help from another person bathing (including washing, rinsing, drying)?: A Little Help from another person to put on and taking off regular upper body clothing?: None Help from another person to put on and taking off regular lower body clothing?: None 6 Click Score: 23   End of Session Equipment Utilized During Treatment: Gait belt;Oxygen Nurse Communication: Mobility status  Activity Tolerance: Patient tolerated treatment well Patient left: in chair;with call bell/phone within reach;with family/visitor present  OT Visit Diagnosis: Muscle weakness (generalized) (M62.81)                Time: 0321-2248 OT Time Calculation (min): 20 min Charges:  OT General Charges $OT Visit: 1 Visit OT Evaluation $OT Eval Low Complexity: 1 Low  Kristopher [name] OTR/L Acute Rehabilitation Services Pager: [phone] Office: [phone]  Kristopher [name] 02/25/2021, 3:52 PM

## 2021-02-25 NOTE — Evaluation (Signed)
Physical Therapy Evaluation Patient Details Name: Kristopher [name] MRN: [id number] DOB: 05/31/1951 Today's Date: 02/25/2021  History of Present Illness  Pt adm 9/14 with acute hypercarbic respiratory failure with AKI. Pt also with anemia. PMH - cardiomyopathy, chf, HTN, DM, COPD, OSA, obestity  Clinical Impression  Pt doing well with mobility and no further PT needed.  Ready for dc from PT standpoint.        Recommendations for follow up therapy are one component of a multi-disciplinary discharge planning process, led by the attending physician.  Recommendations may be updated based on patient status, additional functional criteria and insurance authorization.  Follow Up Recommendations No PT follow up    Equipment Recommendations  None recommended by PT    Recommendations for Other Services       Precautions / Restrictions Precautions Precautions: None      Mobility  Bed Mobility               General bed mobility comments: Pt up in chair    Transfers Overall transfer level: Independent Equipment used: None                Ambulation/Gait Ambulation/Gait assistance: Supervision Gait Distance (Feet): 175 Feet Assistive device: None;4-wheeled walker Gait Pattern/deviations: Decreased stride length Gait velocity: adequate Gait velocity interpretation: >2.62 ft/sec, indicative of community ambulatory General Gait Details: supervision only for lines. Pt without loss of balance. More difficulty with directions for which way to go than with the gait itself.  Stairs            Wheelchair Mobility    Modified Rankin (Stroke Patients Only)       Balance Overall balance assessment: Mild deficits observed, not formally tested                                           Pertinent Vitals/Pain Pain Assessment: No/denies pain    Home Living Family/patient expects to be discharged to:: Private residence Living Arrangements: Other  relatives (cousin) Available Help at Discharge: Family;Available PRN/intermittently Type of Home: House Home Access: Stairs to enter Entrance Stairs-Rails: None Entrance Stairs-Number of Steps: 1 Home Layout: One level Home Equipment: Walker - 4 wheels      Prior Function Level of Independence: Independent               Hand Dominance   Dominant Hand: Right    Extremity/Trunk Assessment   Upper Extremity Assessment Upper Extremity Assessment: Overall WFL for tasks assessed    Lower Extremity Assessment Lower Extremity Assessment: Overall WFL for tasks assessed       Communication   Communication: No difficulties  Cognition Arousal/Alertness: Awake/alert Behavior During Therapy: WFL for tasks assessed/performed Overall Cognitive Status: No family/caregiver present to determine baseline cognitive functioning                                 General Comments: Some decr attention which I suspect is baseline      General Comments General comments (skin integrity, edema, etc.): Pt on 4L of O2 at rest. Inital amb on 4L with SpO2 98% and then decr O2 to 2L with SpO2 95%.    Exercises     Assessment/Plan    PT Assessment Patent does not need any further PT services  PT Problem List  PT Treatment Interventions      PT Goals (Current goals can be found in the Care Plan section)  Acute Rehab PT Goals Patient Stated Goal: go for procedure PT Goal Formulation: All assessment and education complete, DC therapy    Frequency     Barriers to discharge        Co-evaluation               AM-PAC PT "6 Clicks" Mobility  Outcome Measure Help needed turning from your back to your side while in a flat bed without using bedrails?: None Help needed moving from lying on your back to sitting on the side of a flat bed without using bedrails?: None Help needed moving to and from a bed to a chair (including a wheelchair)?: None Help needed  standing up from a chair using your arms (e.g., wheelchair or bedside chair)?: None Help needed to walk in hospital room?: None Help needed climbing 3-5 steps with a railing? : None 6 Click Score: 24    End of Session Equipment Utilized During Treatment: Oxygen Activity Tolerance: Patient tolerated treatment well Patient left: in chair;with call bell/phone within reach Nurse Communication: Mobility status PT Visit Diagnosis: Other abnormalities of gait and mobility (R26.89)    Time: 1050-1105 PT Time Calculation (min) (ACUTE ONLY): 15 min   Charges:   PT Evaluation $PT Eval Low Complexity: 1 Low          [facility] PT Acute Rehabilitation Services Pager [phone] Office [phone]   Angelina Ok [facility] 02/25/2021, 11:26 AM

## 2021-02-25 NOTE — Brief Op Note (Signed)
02/22/2021 - 02/25/2021  12:07 PM  PATIENT:  Kristopher [name]  70 y.o. male  PRE-OPERATIVE DIAGNOSIS:  Anemia  POST-OPERATIVE DIAGNOSIS:  gastritis, gastric ulcer  PROCEDURE:  Procedure(s): ESOPHAGOGASTRODUODENOSCOPY (EGD) WITH PROPOFOL (N/A) BIOPSY  SURGEON:  Surgeon(s) and Role:    * Trenden Hazelrigg, MD - Primary  Findings -------------- -EGD showed small superficial prepyloric gastric ulcer and gastritis.  No evidence of active bleeding.  Biopsies taken.  Recommendations ------------------------- -Continue twice daily PPI -Start soft diet and advance as tolerated -Monitor H&H -GI will follow  [name] MD, FACP 02/25/2021, 12:08 PM  Contact #  [phone]

## 2021-02-25 NOTE — Progress Notes (Signed)
PROGRESS NOTE    Kristopher [name]   GZF:[id number]  DOB: 1950-07-20  DOA: 02/22/2021 PCP: Shirlean Mylar, MD   Brief Narrative:  Kristopher [name] is a 70 year old male with nonischemic cardiomyopathy and EF of 30 to 35% who has declined an ICD, severe obstructive sleep apnea on CPAP, COPD, iron deficiency anemia, presented to the ED on 9/14 for shortness of breath x3 weeks intermittent but progressive.  EMS found him to have oxygen saturations in the 70s and placed him on 3 L of nasal cannula.  At baseline he is able to walk about 35 minutes but over the past few days he was barely able to walk 10 minutes due to dyspnea on exertion.  He also complained of cough with increased sputum production for the past week.  He is compliant with his CPAP at night however his machine broke a few months ago and he has not been able to replace it.  Chest x-ray revealed cardiomegaly with mild interstitial prominence and small effusions.  Hemoglobin noted to be 6.4 with a baseline of around 8.  He declined to have Hemoccult testing in the ED.  EGD was performed in 5/21 and did not reveal a source for bleeding.  Colonoscopy showed hemorrhoids and polyps.  Creatinine noted to be 2.23 with a baseline of 1.1.  While in the ED he became lethargic and was started to improve his mental status.  He was admitted to critical care. 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 Heart failure team was consulted on 9/15.  2D echo revealed an EF of 70 to 75%, hyperdynamic LV function biatrial enlargement mild MR and mild AI.  He continued to require BiPAP for a number of days.  Eventually was weaned down to a nasal cannula and transferred out of the ICU to the [facility]. Of note patient was treated empirically with antibiotics and transition to ceftriaxone and azithromycin with a plan to complete a 7-day course. GI was consulted for heme positive stools and anemia.  Of note the patient was admitted  from 11/06/2019 - 11/08/2019 for symptomatic anemia secondary to an acute upper GI bleed which resolved spontaneously. Source of bleeding was not found.  Subjective: No complaints at this time. He states that he has not been allowed to ambulate yet and is willing to ambulate today.     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 (HCC) -Requiring BiPAP - He has been weaned down to about 2L of oxygen  - Initially given aztreonam and linezolid while in the ICU due to penicillin allergy-currently on ceftriaxone and azithromycin for plan to complete a 7-day course  Active Problems: Obstructive sleep apnea/chronic hypercarbic respiratory failure - He will need a trilogy ventilator when discharged-I have contacted social work to help arrange this  Chronic systolic CHF - EF 25-30% -2 D ECHO mentioned above - diuretics currently on hold  AKI - Cr steadily improving- baseline ~ 1.1 - as mentioned, diuretics currently on hold  Wenckebach heart block - intermittent - will need to place back on telemetry-   Hyperkalemia - given Lokelma on 9/15 - potassium has risen to 5.4 today- will order 2 more doses today  Hemoccult positive, Iron deficiency anemia with acute blood loss - Prior history of H. pylori infection which was untreated - Hemoglobin stable at 8- has received 2 U PRBC so far - Currently on Protonix twice daily -  EGD reveals a gastric ulcer without bleeding and gastritis  Morbid obesity Body mass index is 37.02 kg/m.   Time spent in minutes: 35 DVT prophylaxis: SCDs Start: 02/22/21 2048 Code Status: Full code Family Communication:  Level of Care: Level of care: Med-Surg Disposition Plan:  Status is: Inpatient  Remains inpatient appropriate because:Inpatient level of care appropriate due to severity of illness  Dispo: The patient is from: Home              Anticipated d/c is to: Home               Patient currently is not medically stable to d/c.   Difficult to place patient No      Consultants:  PCCM Heart failure team Procedures:  EGD Antimicrobials:  Anti-infectives (From admission, onward)    Start     Dose/Rate Route Frequency Ordered Stop   02/24/21 1200  cefTRIAXone (ROCEPHIN) 1 g in sodium chloride 0.9 % 100 mL IVPB        1 g 200 mL/hr over 30 Minutes Intravenous Every 24 hours 02/24/21 1036 03/01/21 2359   02/24/21 1200  azithromycin (ZITHROMAX) tablet 500 mg        500 mg Oral Daily 02/24/21 1038 02/27/21 0959   02/24/21 1130  azithromycin (ZITHROMAX) tablet 500 mg  Status:  Discontinued        500 mg Oral Daily 02/24/21 1036 02/24/21 1038   02/23/21 1000  linezolid (ZYVOX) IVPB 600 mg  Status:  Discontinued        600 mg 300 mL/hr over 60 Minutes Intravenous Every 12 hours 02/23/21 0903 02/24/21 1036   02/22/21 2200  aztreonam (AZACTAM) 2 g in sodium chloride 0.9 % 100 mL IVPB  Status:  Discontinued        2 g 200 mL/hr over 30 Minutes Intravenous Every 8 hours 02/22/21 2121 02/24/21 1036        Objective: Vitals:   02/25/21 0400 02/25/21 0500 02/25/21 0517 02/25/21 0600  BP: (!) 128/52  115/67 127/63  Pulse: (!) 119 70 69 64  Resp: (!) 28 19 18 14   Temp:      TempSrc:      SpO2: 91% 99% 100% 100%  Weight:      Height:        Intake/Output Summary (Last 24 hours) at 02/25/2021 0719 Last data filed at 02/25/2021 0400 Gross per 24 hour  Intake 1052.06 ml  Output 1850 ml  Net -797.94 ml   Filed Weights   02/22/21 1727 02/22/21 2245 02/23/21 0309  Weight: 111.1 kg 107 kg 107.2 kg    Examination: General exam: Appears comfortable  HEENT: PERRLA, oral mucosa moist, no sclera icterus or thrush Respiratory system: faint crackles at bases- poor air movement. Respiratory effort normal. Cardiovascular system: S1 & S2 heard, RRR.   Gastrointestinal system: Abdomen soft, non-tender, nondistended. Normal bowel sounds. Central nervous system:  Alert and oriented. No focal neurological deficits. Extremities: No cyanosis, clubbing or edema Skin: No rashes or ulcers Psychiatry:  Mood & affect appropriate.     Data Reviewed: I have personally reviewed following labs and imaging studies  CBC: Recent Labs  Lab 02/22/21 1727 02/22/21 1915 02/23/21 0426 02/23/21 0859 02/23/21 1354 02/24/21 0159 02/24/21 1046 02/25/21 0047  WBC 15.0*  --  15.4*  --   --  11.1*  --  13.8*  NEUTROABS 12.1*  --   --   --   --   --   --   --  HGB 6.4*   < > 7.4* 9.2* 9.2* 6.8* 8.1* 8.3*  HCT 26.3*   < > 29.2* 27.0* 27.0* 26.0* 30.6* 32.2*  MCV 75.4*  --  76.8*  --   --  76.7*  --  79.5*  PLT 675*  --  619*  --   --  541*  --  523*   < > = values in this interval not displayed.   Basic Metabolic Panel: Recent Labs  Lab 02/22/21 1727 02/22/21 1915 02/23/21 0426 02/23/21 0859 02/23/21 1354 02/24/21 0159 02/25/21 0047  NA 137   < > 138 137 137 137 139  K 4.7   < > 5.5* 5.3* 5.1 5.0 5.4*  CL 97*  --  97*  --   --  94* 98  CO2 29  --  31  --   --  32 35*  GLUCOSE 99  --  136*  --   --  100* 99  BUN 30*  --  32*  --   --  38* 21  CREATININE 2.23*  --  2.05*  --   --  1.70* 1.24  CALCIUM 8.7*  --  8.7*  --   --  9.3 9.3  MG  --   --  2.7*  --   --  2.7* 2.8*  PHOS  --   --  7.1*  --   --  4.4 3.2   < > = values in this interval not displayed.   GFR: Estimated Creatinine Clearance: 64.7 mL/min (by C-G formula based on SCr of 1.24 mg/dL). Liver Function Tests: Recent Labs  Lab 02/22/21 1727  AST 12*  ALT 6  ALKPHOS 55  BILITOT 0.6  PROT 6.9  ALBUMIN 3.7   No results for input(s): LIPASE, AMYLASE in the last 168 hours. No results for input(s): AMMONIA in the last 168 hours. Coagulation Profile: No results for input(s): INR, PROTIME in the last 168 hours. Cardiac Enzymes: No results for input(s): CKTOTAL, CKMB, CKMBINDEX, TROPONINI in the last 168 hours. BNP (last 3 results) No results for input(s): PROBNP in the last 8760  hours. HbA1C: No results for input(s): HGBA1C in the last 72 hours. CBG: Recent Labs  Lab 02/23/21 0734 02/23/21 1150 02/23/21 1548 02/23/21 2333 02/24/21 0308  GLUCAP 126* 150* 122* 106* 94   Lipid Profile: No results for input(s): CHOL, HDL, LDLCALC, TRIG, CHOLHDL, LDLDIRECT in the last 72 hours. Thyroid Function Tests: No results for input(s): TSH, T4TOTAL, FREET4, T3FREE, THYROIDAB in the last 72 hours. Anemia Panel: No results for input(s): VITAMINB12, FOLATE, FERRITIN, TIBC, IRON, RETICCTPCT in the last 72 hours. Urine analysis:    Component Value Date/Time   COLORURINE YELLOW 02/22/2021 2051   APPEARANCEUR CLEAR 02/22/2021 2051   LABSPEC 1.012 02/22/2021 2051   PHURINE 5.0 02/22/2021 2051   GLUCOSEU NEGATIVE 02/22/2021 2051   HGBUR NEGATIVE 02/22/2021 2051   BILIRUBINUR NEGATIVE 02/22/2021 2051   KETONESUR NEGATIVE 02/22/2021 2051   PROTEINUR NEGATIVE 02/22/2021 2051   NITRITE NEGATIVE 02/22/2021 2051   LEUKOCYTESUR NEGATIVE 02/22/2021 2051   Sepsis Labs: @LABRCNTIP (procalcitonin:4,lacticidven:4) ) Recent Results (from the past 240 hour(s))  Resp Panel by RT-PCR (Flu A&B, Covid) Nasopharyngeal Swab     Status: None   Collection Time: 02/22/21  6:36 PM   Specimen: Nasopharyngeal Swab; Nasopharyngeal(NP) swabs in vial transport medium  Result Value Ref Range Status   SARS Coronavirus 2 by RT PCR NEGATIVE NEGATIVE Final    Comment: (NOTE) SARS-CoV-2 target nucleic acids are NOT  DETECTED.  The SARS-CoV-2 RNA is generally detectable in upper respiratory specimens during the acute phase of infection. The lowest concentration of SARS-CoV-2 viral copies this assay can detect is 138 copies/mL. A negative result does not preclude SARS-Cov-2 infection and should not be used as the sole basis for treatment or other patient management decisions. A negative result may occur with  improper specimen collection/handling, submission of specimen other than nasopharyngeal  swab, presence of viral mutation(s) within the areas targeted by this assay, and inadequate number of viral copies(<138 copies/mL). A negative result must be combined with clinical observations, patient history, and epidemiological information. The expected result is Negative.  Fact Sheet for Patients:  BloggerCourse.com  Fact Sheet for Healthcare Providers:  SeriousBroker.it  This test is no t yet approved or cleared by the [place] FDA and  has been authorized for detection and/or diagnosis of SARS-CoV-2 by FDA under an Emergency Use Authorization (EUA). This EUA will remain  in effect (meaning this test can be used) for the duration of the COVID-19 declaration under Section 564(b)(1) of the Act, 21 U.S.C.section 360bbb-3(b)(1), unless the authorization is terminated  or revoked sooner.       Influenza A by PCR NEGATIVE NEGATIVE Final   Influenza B by PCR NEGATIVE NEGATIVE Final    Comment: (NOTE) The Xpert Xpress SARS-CoV-2/FLU/RSV plus assay is intended as an aid in the diagnosis of influenza from Nasopharyngeal swab specimens and should not be used as a sole basis for treatment. Nasal washings and aspirates are unacceptable for Xpert Xpress SARS-CoV-2/FLU/RSV testing.  Fact Sheet for Patients: BloggerCourse.com  Fact Sheet for Healthcare Providers: SeriousBroker.it  This test is not yet approved or cleared by the [place] FDA and has been authorized for detection and/or diagnosis of SARS-CoV-2 by FDA under an Emergency Use Authorization (EUA). This EUA will remain in effect (meaning this test can be used) for the duration of the COVID-19 declaration under Section 564(b)(1) of the Act, 21 U.S.C. section 360bbb-3(b)(1), unless the authorization is terminated or revoked.  Performed at [facility], [address] [place], [place] [address]   MRSA Next  Gen by PCR, Nasal     Status: None   Collection Time: 02/22/21 10:45 PM   Specimen: Nasal Mucosa; Nasal Swab  Result Value Ref Range Status   MRSA by PCR Next Gen NOT DETECTED NOT DETECTED Final    Comment: (NOTE) The GeneXpert MRSA Assay (FDA approved for NASAL specimens only), is one component of a comprehensive MRSA colonization surveillance program. It is not intended to diagnose MRSA infection nor to guide or monitor treatment for MRSA infections. Test performance is not FDA approved in patients less than 71 years old. Performed at [facility], [address], [place], [place] [address]   Culture, blood (routine x 2)     Status: None (Preliminary result)   Collection Time: 02/23/21  9:55 AM   Specimen: BLOOD RIGHT HAND  Result Value Ref Range Status   Specimen Description BLOOD RIGHT HAND  Final   Special Requests   Final    BOTTLES DRAWN AEROBIC AND ANAEROBIC Blood Culture adequate volume   Culture   Final    NO GROWTH < 24 HOURS Performed at [facility], [address] [place], [place] [address]    Report Status PENDING  Incomplete  Culture, blood (routine x 2)     Status: None (Preliminary result)   Collection Time: 02/23/21 10:05 AM   Specimen: BLOOD  RIGHT HAND  Result Value Ref Range Status   Specimen Description BLOOD RIGHT HAND  Final   Special Requests   Final    BOTTLES DRAWN AEROBIC AND ANAEROBIC Blood Culture adequate volume   Culture   Final    NO GROWTH < 24 HOURS Performed at [facility], [address], [place], [place] [address]    Report Status PENDING  Incomplete         Radiology Studies: DG Chest Port 1 View  Result Date: 02/24/2021 CLINICAL DATA:  Acute respiratory failure EXAM: PORTABLE CHEST 1 VIEW COMPARISON:  Chest radiograph 02/22/2021 FINDINGS: The heart is mildly enlarged, unchanged. The mediastinal contours are stable. Lung volumes are low. There is a small left pleural effusion with adjacent airspace disease,  similar to the study from two days ago. There is no new focal airspace disease. There is no significant right pleural effusion. There is no pneumothorax. There is no acute osseous abnormality. IMPRESSION: Low lung volumes with a small left pleural effusion and adjacent airspace disease, not significantly changed. Electronically Signed   By: Lesia Hausen M.D.   On: 02/24/2021 08:32   ECHOCARDIOGRAM COMPLETE  Result Date: 02/23/2021    ECHOCARDIOGRAM REPORT   Patient Name:   SOHIL TIMKO [facility] Date of Exam: 02/23/2021 Medical Rec #:  [id number]     Height:       67.0 in Accession #:    [id number]    Weight:       236.3 lb Date of Birth:  September 10, 1950     BSA:          2.171 m Patient Age:    69 years      BP:           93/53 mmHg Patient Gender: M             HR:           62 bpm. Exam Location:  Inpatient Procedure: 2D Echo Indications:    acute respiratory distress  History:        Patient has prior history of Echocardiogram examinations, most                 recent 11/13/2018. Cardiomyopathy, CAD, COPD and Pulmonary HTN,                 Arrythmias:LBBB, Signs/Symptoms:Shortness of Breath; Risk                 Factors:Hypertension, Dyslipidemia and Sleep Apnea.  Sonographer:    Delcie Roch RDCS Referring Phys: [id number] Clarene Critchley [facility]  Sonographer Comments: Technically difficult study due to poor echo windows. Image acquisition challenging due to patient body habitus. IMPRESSIONS  1. Left ventricular ejection fraction, by estimation, is 70 to 75%. The left ventricle has hyperdynamic function. The left ventricle has no regional wall motion abnormalities. There is mild left ventricular hypertrophy. Left ventricular diastolic parameters are indeterminate.  2. Right ventricular systolic function is normal. The right ventricular size is normal.  3. Left atrial size was moderately dilated.  4. Right atrial size was moderately dilated.  5. The mitral valve is normal in structure. Mild mitral valve regurgitation.  6. The aortic  valve is tricuspid. Aortic valve regurgitation is mild. Mild aortic valve sclerosis is present, with no evidence of aortic valve stenosis.  7. The inferior vena cava is dilated in size with >50% respiratory variability, suggesting right atrial pressure of 8 mmHg. FINDINGS  Left Ventricle: Left ventricular ejection  fraction, by estimation, is 70 to 75%. The left ventricle has hyperdynamic function. The left ventricle has no regional wall motion abnormalities. The left ventricular internal cavity size was normal in size. There is mild left ventricular hypertrophy. Left ventricular diastolic parameters are indeterminate. Right Ventricle: The right ventricular size is normal. Right vetricular wall thickness was not assessed. Right ventricular systolic function is normal. Left Atrium: Left atrial size was moderately dilated. Right Atrium: Right atrial size was moderately dilated. Pericardium: Trivial pericardial effusion is present. Mitral Valve: The mitral valve is normal in structure. Mild mitral valve regurgitation. Tricuspid Valve: The tricuspid valve is normal in structure. Tricuspid valve regurgitation is mild. Aortic Valve: The aortic valve is tricuspid. Aortic valve regurgitation is mild. Mild aortic valve sclerosis is present, with no evidence of aortic valve stenosis. Pulmonic Valve: The pulmonic valve was normal in structure. Pulmonic valve regurgitation is not visualized. Aorta: The aortic root and ascending aorta are structurally normal, with no evidence of dilitation. Venous: The inferior vena cava is dilated in size with greater than 50% respiratory variability, suggesting right atrial pressure of 8 mmHg. IAS/Shunts: No atrial level shunt detected by color flow Doppler.  LEFT VENTRICLE PLAX 2D LVIDd:         5.50 cm  Diastology LVIDs:         3.10 cm  LV e' medial:    7.83 cm/s LV PW:         1.20 cm  LV E/e' medial:  13.3 LV IVS:        1.10 cm  LV e' lateral:   9.90 cm/s LVOT diam:     2.30 cm  LV E/e'  lateral: 10.5 LV SV:         103 LV SV Index:   48 LVOT Area:     4.15 cm  RIGHT VENTRICLE             IVC RV S prime:     14.90 cm/s  IVC diam: 2.50 cm TAPSE (M-mode): 2.3 cm LEFT ATRIUM              Index       RIGHT ATRIUM           Index LA diam:        5.00 cm  2.30 cm/m  RA Area:     23.50 cm LA Vol (A2C):   99.7 ml  45.93 ml/m RA Volume:   81.20 ml  37.40 ml/m LA Vol (A4C):   96.7 ml  44.54 ml/m LA Biplane Vol: 101.0 ml 46.52 ml/m  AORTIC VALVE LVOT Vmax:   118.00 cm/s LVOT Vmean:  76.500 cm/s LVOT VTI:    0.249 m  AORTA Ao Root diam: 2.80 cm Ao Asc diam:  3.30 cm MITRAL VALVE MV Area (PHT): 3.08 cm     SHUNTS MV Decel Time: 246 msec     Systemic VTI:  0.25 m MV E velocity: 104.00 cm/s  Systemic Diam: 2.30 cm MV A velocity: 103.00 cm/s MV E/A ratio:  1.01 Dietrich Pates MD Electronically signed by Dietrich Pates MD Signature Date/Time: 02/23/2021/4:08:10 PM    Final       Scheduled Meds:  sodium chloride   Intravenous Once   sodium chloride   Intravenous Once   arformoterol  15 mcg Nebulization BID   azithromycin  500 mg Oral Daily   budesonide (PULMICORT) nebulizer solution  0.5 mg Nebulization BID   Chlorhexidine Gluconate Cloth  6 each Topical QHS  pantoprazole (PROTONIX) IV  40 mg Intravenous Q12H   Continuous Infusions:  cefTRIAXone (ROCEPHIN)  IV Stopped (02/24/21 1213)     LOS: 3 days      Calvert Cantor, MD [facility] Pager: [email or web address] 02/25/2021, 7:19 AM

## 2021-02-25 NOTE — Op Note (Signed)
[facility] Patient Name: Kristopher [name] Procedure Date : 02/25/2021 MRN: [id number] Attending MD: [name] , MD Date of Birth: 10-16-1950 CSN: [id number] Age: 70 Admit Type: Inpatient Procedure:                Upper GI endoscopy Indications:              Iron deficiency anemia, Follow-up of Helicobacter                            pylori Providers:                [name], MD, Roselie Awkward, RN, Lawson Radar, Technician, Dairl Ponder, CRNA Referring MD:              Medicines:                Sedation Administered by an Anesthesia Professional Complications:            No immediate complications. Estimated Blood Loss:     Estimated blood loss was minimal. Procedure:                Pre-Anesthesia Assessment:                           - Prior to the procedure, a History and Physical                            was performed, and patient medications and                            allergies were reviewed. The patient's tolerance of                            previous anesthesia was also reviewed. The risks                            and benefits of the procedure and the sedation                            options and risks were discussed with the patient.                            All questions were answered, and informed consent                            was obtained. Prior Anticoagulants: The patient has                            taken no previous anticoagulant or antiplatelet                            agents. ASA Grade Assessment: III - A patient with  severe systemic disease. After reviewing the risks                            and benefits, the patient was deemed in                            satisfactory condition to undergo the procedure.                           After obtaining informed consent, the endoscope was                            passed under direct vision. Throughout the                             procedure, the patient's blood pressure, pulse, and                            oxygen saturations were monitored continuously. The                            [id number]-[id number] ([id number]) Olympus endoscope was introduced                            through the mouth, and advanced to the second part                            of duodenum. The upper GI endoscopy was                            accomplished without difficulty. The patient                            tolerated the procedure well. Scope In: Scope Out: Findings:      The Z-line was irregular and was found 36 cm from the incisors.      A small hiatal hernia was present.      One non-bleeding superficial gastric ulcer with no stigmata of bleeding       was found in the prepyloric region of the stomach. The lesion was 5 mm       in largest dimension. Biopsies were taken with a cold forceps for       histology.      Scattered mild inflammation characterized by congestion (edema),       erosions, erythema and friability was found in the entire examined       stomach. Biopsies were taken with a cold forceps for histology. ( Jar #       1)      The cardia and gastric fundus were normal on retroflexion.      The duodenal bulb, first portion of the duodenum and second portion of       the duodenum were normal. Impression:               - Z-line irregular, 36 cm from the incisors.                           -  Small hiatal hernia.                           - Non-bleeding gastric ulcer with no stigmata of                            bleeding. Biopsied.                           - Gastritis.                           - Normal duodenal bulb, first portion of the                            duodenum and second portion of the duodenum. Recommendation:           - Return patient to hospital ward for ongoing care.                           - Soft diet.                           - Continue present medications.                           - Await  pathology results. Procedure Code(s):        --- Professional ---                           [phone], Esophagogastroduodenoscopy, flexible,                            transoral; with biopsy, single or multiple Diagnosis Code(s):        --- Professional ---                           K22.8, Other specified diseases of esophagus                           K44.9, Diaphragmatic hernia without obstruction or                            gangrene                           K25.9, Gastric ulcer, unspecified as acute or                            chronic, without hemorrhage or perforation                           K29.70, Gastritis, unspecified, without bleeding                           D50.9, Iron deficiency anemia, unspecified  B96.81, Helicobacter pylori [H. pylori] as the                            cause of diseases classified elsewhere CPT copyright 2019 [employer]. All rights reserved. The codes documented in this report are preliminary and upon coder review may  be revised to meet current compliance requirements. [name], MD [name], MD 02/25/2021 12:17:18 PM This report has been signed electronically. Number of Addenda: 0

## 2021-02-25 NOTE — Anesthesia Postprocedure Evaluation (Signed)
Anesthesia Post Note  Patient: Kristopher [name]  Procedure(s) Performed: ESOPHAGOGASTRODUODENOSCOPY (EGD) WITH PROPOFOL BIOPSY     Patient location during evaluation: PACU Anesthesia Type: MAC Level of consciousness: awake and alert Pain management: pain level controlled Vital Signs Assessment: post-procedure vital signs reviewed and stable Respiratory status: spontaneous breathing, nonlabored ventilation, respiratory function stable and patient connected to nasal cannula oxygen Cardiovascular status: stable and blood pressure returned to baseline Postop Assessment: no apparent nausea or vomiting Anesthetic complications: no   No notable events documented.  Last Vitals:  Vitals:   02/25/21 1239 02/25/21 1302  BP: 127/65 139/64  Pulse: 86 74  Resp: (!) 22   Temp: 36.6 C 36.9 C  SpO2: 97% 100%    Last Pain:  Vitals:   02/25/21 1302  TempSrc: Oral  PainSc:                  March Rummage Shelene Krage

## 2021-02-25 NOTE — Transfer of Care (Signed)
Immediate Anesthesia Transfer of Care Note  Patient: Kristopher [name]  Procedure(s) Performed: ESOPHAGOGASTRODUODENOSCOPY (EGD) WITH PROPOFOL BIOPSY  Patient Location: PACU  Anesthesia Type:MAC  Level of Consciousness: awake, alert  and oriented  Airway & Oxygen Therapy: Patient Spontanous Breathing and Patient connected to nasal cannula oxygen  Post-op Assessment: Report given to RN and Post -op Vital signs reviewed and stable  Post vital signs: Reviewed and stable  Last Vitals:  Vitals Value Taken Time  BP 137/75 02/25/21 1211  Temp    Pulse 85 02/25/21 1213  Resp 22 02/25/21 1213  SpO2 100 % 02/25/21 1213  Vitals shown include unvalidated device data.  Last Pain:  Vitals:   02/25/21 1140  TempSrc: Oral  PainSc: 0-No pain         Complications: No notable events documented.

## 2021-02-25 NOTE — Interval H&P Note (Signed)
History and Physical Interval Note:  02/25/2021 11:41 AM  Kristopher [name]  has presented today for surgery, with the diagnosis of Anemia.  The various methods of treatment have been discussed with the patient and family. After consideration of risks, benefits and other options for treatment, the patient has consented to  Procedure(s): ESOPHAGOGASTRODUODENOSCOPY (EGD) WITH PROPOFOL (N/A) as a surgical intervention.  The patient's history has been reviewed, patient examined, no change in status, stable for surgery.  I have reviewed the patient's chart and labs.  Questions were answered to the patient's satisfaction.     [name]

## 2021-02-25 NOTE — Anesthesia Preprocedure Evaluation (Addendum)
Anesthesia Evaluation  Patient identified by MRN, date of birth, ID band Patient awake    Reviewed: Allergy & Precautions, NPO status , Patient's Chart, lab work & pertinent test results  Airway Mallampati: II  TM Distance: >3 FB Neck ROM: Full    Dental  (+) Poor Dentition, Missing, Chipped, Edentulous Upper   Pulmonary asthma , sleep apnea and Continuous Positive Airway Pressure Ventilation , COPD, former smoker,    Pulmonary exam normal        Cardiovascular hypertension, + CAD and +CHF   Rhythm:Regular Rate:Normal     Neuro/Psych negative neurological ROS  negative psych ROS   GI/Hepatic Neg liver ROS, PUD,   Endo/Other  negative endocrine ROS  Renal/GU negative Renal ROS  negative genitourinary   Musculoskeletal negative musculoskeletal ROS (+)   Abdominal (+)  Abdomen: soft. Bowel sounds: normal.  Peds  Hematology  (+) anemia ,   Anesthesia Other Findings   Reproductive/Obstetrics                           Anesthesia Physical Anesthesia Plan  ASA: 3  Anesthesia Plan: MAC   Post-op Pain Management:    Induction: Intravenous  PONV Risk Score and Plan: 1 and Propofol infusion and Treatment may vary due to age or medical condition  Airway Management Planned: Simple Face Mask, Natural Airway and Nasal Cannula  Additional Equipment: None  Intra-op Plan:   Post-operative Plan:   Informed Consent: I have reviewed the patients History and Physical, chart, labs and discussed the procedure including the risks, benefits and alternatives for the proposed anesthesia with the patient or authorized representative who has indicated his/her understanding and acceptance.     Dental advisory given  Plan Discussed with: CRNA  Anesthesia Plan Comments: (ECHO 09/22: 1. Left ventricular ejection fraction, by estimation, is 70 to 75%. The  left ventricle has hyperdynamic function. The  left ventricle has no  regional wall motion abnormalities. There is mild left ventricular  hypertrophy. Left ventricular diastolic  parameters are indeterminate.  2. Right ventricular systolic function is normal. The right ventricular  size is normal.  3. Left atrial size was moderately dilated.  4. Right atrial size was moderately dilated.  5. The mitral valve is normal in structure. Mild mitral valve  regurgitation.  6. The aortic valve is tricuspid. Aortic valve regurgitation is mild.  Mild aortic valve sclerosis is present, with no evidence of aortic valve  stenosis.  7. The inferior vena cava is dilated in size with >50% respiratory  variability, suggesting right atrial pressure of 8 mmHg.)        Anesthesia Quick Evaluation

## 2021-02-26 ENCOUNTER — Encounter (HOSPITAL_COMMUNITY): Payer: Self-pay | Admitting: Gastroenterology

## 2021-02-26 DIAGNOSIS — J9622 Acute and chronic respiratory failure with hypercapnia: Secondary | ICD-10-CM | POA: Diagnosis not present

## 2021-02-26 DIAGNOSIS — G4733 Obstructive sleep apnea (adult) (pediatric): Secondary | ICD-10-CM | POA: Diagnosis not present

## 2021-02-26 DIAGNOSIS — D649 Anemia, unspecified: Secondary | ICD-10-CM | POA: Diagnosis not present

## 2021-02-26 LAB — CBC
HCT: 31.9 % — ABNORMAL LOW (ref 39.0–52.0)
Hemoglobin: 8.3 g/dL — ABNORMAL LOW (ref 13.0–17.0)
MCH: 21.2 pg — ABNORMAL LOW (ref 26.0–34.0)
MCHC: 26 g/dL — ABNORMAL LOW (ref 30.0–36.0)
MCV: 81.4 fL (ref 80.0–100.0)
Platelets: 297 10*3/uL (ref 150–400)
RBC: 3.92 MIL/uL — ABNORMAL LOW (ref 4.22–5.81)
RDW: 20.5 % — ABNORMAL HIGH (ref 11.5–15.5)
WBC: 11.1 10*3/uL — ABNORMAL HIGH (ref 4.0–10.5)
nRBC: 0.3 % — ABNORMAL HIGH (ref 0.0–0.2)

## 2021-02-26 MED ORDER — FUROSEMIDE 40 MG PO TABS
80.0000 mg | ORAL_TABLET | Freq: Two times a day (BID) | ORAL | Status: DC
  Administered 2021-02-26 – 2021-02-27 (×2): 80 mg via ORAL
  Filled 2021-02-26 (×2): qty 2

## 2021-02-26 NOTE — [place] Notes (Signed)
[facility] Progress Note  Kristopher [name] 70 y.o. 11-08-1950  CC: Anemia   Subjective: Patient seen and examined at bedside.  Resting comfortably.  No acute distress.  Discussed with RN.  No evidence of overt bleeding.  ROS : Afebrile.  Negative for chest pain   Objective: Vital signs in last 24 hours: Vitals:   02/26/21 0923 02/26/21 1211  BP: 134/64   Pulse: 89 78  Resp: 18 18  Temp: 98.8 F (37.1 C)   SpO2: 97% 97%    Physical Exam:  General : Resting comfortably, not in acute distress Chest -no visible respiratory distress Abdomen : Soft, nontender, nondistended, bowel sounds present.  No peritoneal signs Lower extremity : No edema   Lab Results: Recent Labs    02/24/21 0159 02/25/21 0047 02/25/21 1741  NA 137 139 138  K 5.0 5.4* 4.4  CL 94* 98 94*  CO2 32 35* 37*  GLUCOSE 100* 99 106*  BUN 38* 21 12  CREATININE 1.70* 1.24 1.04  CALCIUM 9.3 9.3 9.1  MG 2.7* 2.8*  --   PHOS 4.4 3.2  --    No results for input(s): AST, ALT, ALKPHOS, BILITOT, PROT, ALBUMIN in the last 72 hours. Recent Labs    02/25/21 0047 02/26/21 0159  WBC 13.8* 11.1*  HGB 8.3* 8.3*  HCT 32.2* 31.9*  MCV 79.5* 81.4  PLT 523* 297   No results for input(s): LABPROT, INR in the last 72 hours.    Assessment/Plan: -Iron deficiency anemia.  EGD yesterday showed small gastric ulcer and gastritis.  Biopsies pending. - H/O H. pylori infection.  Patient was lost to follow[place]. -Multiple other comorbidities including CHF, cardiomyopathy, COPD and pulmonary hypertension.  Recommendations ------------------------- -Patient's hemoglobin is stable.  No overt bleeding. -Recommend twice daily PPI for 4 weeks followed by PPI Protonix 40 mg once a day -Advance diet to heart healthy -Okay to discharge from GI standpoint.  Follow-up in GI clinic in 4 to 6 weeks after discharge. -GI will sign off.  Call us back if needed  [name] MD,  FACP 02/26/2021, 12:29 PM  Contact #  [phone]

## 2021-02-26 NOTE — [place] Notes (Addendum)
PROGRESS NOTE    Kristopher [name]   CVE:[id number]  DOB: 03-10-1951  DOA: 02/22/2021 PCP: Shirlean Mylar, MD   Brief Narrative:  Kristopher [name] is a 70 year old male with nonischemic cardiomyopathy and EF of 30 to 35% who has declined an ICD, severe obstructive sleep apnea on CPAP, COPD, iron deficiency anemia, presented to the ED on 9/14 for shortness of breath x3 weeks intermittent but progressive.  EMS found him to have oxygen saturations in the 70s and placed him on 3 L of nasal cannula.  At baseline he is able to walk about 35 minutes but over the past few days he was barely able to walk 10 minutes due to dyspnea on exertion.  He also complained of cough with increased sputum production for the past week.  He is compliant with his CPAP at night however his machine broke a few months ago and he has not been able to replace it.  Chest x-ray revealed cardiomegaly with mild interstitial prominence and small effusions.  Hemoglobin noted to be 6.4 with a baseline of around 8.  He declined to have Hemoccult testing in the ED.  EGD was performed in 5/21 and did not reveal a source for bleeding.  Colonoscopy showed hemorrhoids and polyps.  Creatinine noted to be 2.23 with a baseline of 1.1.  While in the ED he became lethargic and was started to improve his mental status.  He was admitted to critical care. 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 Heart failure team was consulted on 9/15.  2D echo revealed an EF of 70 to 75%, hyperdynamic LV function biatrial enlargement mild MR and mild AI.  He continued to require BiPAP for a number of days.  Eventually was weaned down to a nasal cannula and transferred out of the ICU to the [facility]. Of note patient was treated empirically with antibiotics and transition to ceftriaxone and azithromycin with a plan to complete a 7-day course. GI was consulted for heme positive stools and anemia.  Of note the patient was admitted  from 11/06/2019 - 11/08/2019 for symptomatic anemia secondary to an acute upper GI bleed which resolved spontaneously. Source of bleeding was not found.  Subjective: He has no complaints today. Eating without difficulty. No dyspnea. No other complaints.     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 (HCC) -Requiring BiPAP - He has been weaned down to 2L of oxygen which he will need to be discharged on - Initially given aztreonam and linezolid while in the ICU due to penicillin allergy-currently on ceftriaxone and azithromycin for plan to complete a 7-day course (last day will be 03/01/21)  Active Problems: Obstructive sleep apnea/chronic hypercarbic respiratory failure Obesity hypoventilation - He will need a trilogy ventilator when discharged -I have contacted social work to help arrange this and have been told that it will likely take a few days for insurance to approve it - unfortunately, he cannot be discharged without it  Chronic systolic CHF - EF 25-30% -2 D ECHO mentioned above - diuretics have been on hold - I will resume Lasix today  AKI - Cr steadily improved to normal - baseline ~ 1.1  Wenckebach heart block - intermittent - will need to place back on telemetry-   Hyperkalemia - given Lokelma on 9/15 - 9/17> potassium rose to 5.4 - given 2 doses of Lokelma - 9/18> K 4.4  Hemoccult  positive, Iron deficiency anemia with acute blood loss - Prior history of H. pylori infection which was untreated - Hemoglobin stable at 8- has received 2 U PRBC so far - Currently on Protonix twice daily which GI is recommending to continue for 4 wks followed by daily dosing - EGD reveals a gastric ulcer without bleeding and gastritis  Morbid obesity Body mass index is 36.88 kg/m.   Time spent in minutes: 35 DVT prophylaxis: SCDs Start: 02/22/21 2048 Code Status: Full code Family Communication:   Level of Care: Level of care: Telemetry Medical Disposition Plan:  Status is: Inpatient  Remains inpatient appropriate because:Inpatient level of care appropriate due to severity of illness  Dispo: The patient is from: Home              Anticipated d/c is to: Home              Patient currently is not medically stable to d/c.   Difficult to place patient No  Consultants:  PCCM Heart failure team Procedures:  EGD Antimicrobials:  Anti-infectives (From admission, onward)    Start     Dose/Rate Route Frequency Ordered Stop   02/24/21 1200  cefTRIAXone (ROCEPHIN) 1 g in sodium chloride 0.9 % 100 mL IVPB        1 g 200 mL/hr over 30 Minutes Intravenous Every 24 hours 02/24/21 1036 03/01/21 2359   02/24/21 1200  azithromycin (ZITHROMAX) tablet 500 mg        500 mg Oral Daily 02/24/21 1038 02/26/21 1034   02/24/21 1130  azithromycin (ZITHROMAX) tablet 500 mg  Status:  Discontinued        500 mg Oral Daily 02/24/21 1036 02/24/21 1038   02/23/21 1000  linezolid (ZYVOX) IVPB 600 mg  Status:  Discontinued        600 mg 300 mL/hr over 60 Minutes Intravenous Every 12 hours 02/23/21 0903 02/24/21 1036   02/22/21 2200  aztreonam (AZACTAM) 2 g in sodium chloride 0.9 % 100 mL IVPB  Status:  Discontinued        2 g 200 mL/hr over 30 Minutes Intravenous Every 8 hours 02/22/21 2121 02/24/21 1036        Objective: Vitals:   02/26/21 0412 02/26/21 0747 02/26/21 0923 02/26/21 1211  BP:   134/64   Pulse:  81 89 78  Resp:  16 18 18   Temp:   98.8 F (37.1 C)   TempSrc:   Oral   SpO2:  99% 97% 97%  Weight: 106.8 kg     Height:        Intake/Output Summary (Last 24 hours) at 02/26/2021 1342 Last data filed at 02/26/2021 0900 Gross per 24 hour  Intake 600 ml  Output 350 ml  Net 250 ml    Filed Weights   02/22/21 2245 02/23/21 0309 02/26/21 0412  Weight: 107 kg 107.2 kg 106.8 kg    Examination: General exam: Appears comfortable  HEENT: PERRLA, oral mucosa moist, no sclera icterus  or thrush Respiratory system: Clear to auscultation. Respiratory effort normal. Cardiovascular system: S1 & S2 heard, regular rate and rhythm Gastrointestinal system: Abdomen soft, non-tender, nondistended. Normal bowel sounds   Central nervous system: Alert and oriented. No focal neurological deficits. Extremities: No cyanosis, clubbing or edema Skin: No rashes or ulcers Psychiatry:  Mood & affect appropriate.      Data Reviewed: I have personally reviewed following labs and imaging studies  CBC: Recent Labs  Lab 02/22/21 1727 02/22/21 1915 02/23/21 0426  02/23/21 0859 02/23/21 1354 02/24/21 0159 02/24/21 1046 02/25/21 0047 02/26/21 0159  WBC 15.0*  --  15.4*  --   --  11.1*  --  13.8* 11.1*  NEUTROABS 12.1*  --   --   --   --   --   --   --   --   HGB 6.4*   < > 7.4*   < > 9.2* 6.8* 8.1* 8.3* 8.3*  HCT 26.3*   < > 29.2*   < > 27.0* 26.0* 30.6* 32.2* 31.9*  MCV 75.4*  --  76.8*  --   --  76.7*  --  79.5* 81.4  PLT 675*  --  619*  --   --  541*  --  523* 297   < > = values in this interval not displayed.    Basic Metabolic Panel: Recent Labs  Lab 02/22/21 1727 02/22/21 1915 02/23/21 0426 02/23/21 0859 02/23/21 1354 02/24/21 0159 02/25/21 0047 02/25/21 1741  NA 137   < > 138 137 137 137 139 138  K 4.7   < > 5.5* 5.3* 5.1 5.0 5.4* 4.4  CL 97*  --  97*  --   --  94* 98 94*  CO2 29  --  31  --   --  32 35* 37*  GLUCOSE 99  --  136*  --   --  100* 99 106*  BUN 30*  --  32*  --   --  38* 21 12  CREATININE 2.23*  --  2.05*  --   --  1.70* 1.24 1.04  CALCIUM 8.7*  --  8.7*  --   --  9.3 9.3 9.1  MG  --   --  2.7*  --   --  2.7* 2.8*  --   PHOS  --   --  7.1*  --   --  4.4 3.2  --    < > = values in this interval not displayed.    GFR: Estimated Creatinine Clearance: 77 mL/min (by C-G formula based on SCr of 1.04 mg/dL). Liver Function Tests: Recent Labs  Lab 02/22/21 1727  AST 12*  ALT 6  ALKPHOS 55  BILITOT 0.6  PROT 6.9  ALBUMIN 3.7    No results for  input(s): LIPASE, AMYLASE in the last 168 hours. No results for input(s): AMMONIA in the last 168 hours. Coagulation Profile: No results for input(s): INR, PROTIME in the last 168 hours. Cardiac Enzymes: No results for input(s): CKTOTAL, CKMB, CKMBINDEX, TROPONINI in the last 168 hours. BNP (last 3 results) No results for input(s): PROBNP in the last 8760 hours. HbA1C: No results for input(s): HGBA1C in the last 72 hours. CBG: Recent Labs  Lab 02/23/21 0734 02/23/21 1150 02/23/21 1548 02/23/21 2333 02/24/21 0308  GLUCAP 126* 150* 122* 106* 94    Lipid Profile: No results for input(s): CHOL, HDL, LDLCALC, TRIG, CHOLHDL, LDLDIRECT in the last 72 hours. Thyroid Function Tests: No results for input(s): TSH, T4TOTAL, FREET4, T3FREE, THYROIDAB in the last 72 hours. Anemia Panel: No results for input(s): VITAMINB12, FOLATE, FERRITIN, TIBC, IRON, RETICCTPCT in the last 72 hours. Urine analysis:    Component Value Date/Time   COLORURINE YELLOW 02/22/2021 2051   APPEARANCEUR CLEAR 02/22/2021 2051   LABSPEC 1.012 02/22/2021 2051   PHURINE 5.0 02/22/2021 2051   GLUCOSEU NEGATIVE 02/22/2021 2051   HGBUR NEGATIVE 02/22/2021 2051   BILIRUBINUR NEGATIVE 02/22/2021 2051   KETONESUR NEGATIVE 02/22/2021 2051   PROTEINUR NEGATIVE 02/22/2021  2051   NITRITE NEGATIVE 02/22/2021 2051   LEUKOCYTESUR NEGATIVE 02/22/2021 2051   Sepsis Labs: @LABRCNTIP (procalcitonin:4,lacticidven:4) ) Recent Results (from the past 240 hour(s))  Resp Panel by RT-PCR (Flu A&B, Covid) Nasopharyngeal Swab     Status: None   Collection Time: 02/22/21  6:36 PM   Specimen: Nasopharyngeal Swab; Nasopharyngeal(NP) swabs in vial transport medium  Result Value Ref Range Status   SARS Coronavirus 2 by RT PCR NEGATIVE NEGATIVE Final    Comment: (NOTE) SARS-CoV-2 target nucleic acids are NOT DETECTED.  The SARS-CoV-2 RNA is generally detectable in upper respiratory specimens during the acute phase of infection. The  lowest concentration of SARS-CoV-2 viral copies this assay can detect is 138 copies/mL. A negative result does not preclude SARS-Cov-2 infection and should not be used as the sole basis for treatment or other patient management decisions. A negative result may occur with  improper specimen collection/handling, submission of specimen other than nasopharyngeal swab, presence of viral mutation(s) within the areas targeted by this assay, and inadequate number of viral copies(<138 copies/mL). A negative result must be combined with clinical observations, patient history, and epidemiological information. The expected result is Negative.  Fact Sheet for Patients:  02/24/21  Fact Sheet for Healthcare Providers:  BloggerCourse.com  This test is no t yet approved or cleared by the [employer]iousBroker.[place] FDA and  has been authorized for detection and/or diagnosis of SARS-CoV-2 by FDA under an Emergency Use Authorization (EUA). This EUA will remain  in effect (meaning this test can be used) for the duration of the COVID-19 declaration under Section 564(b)(1) of the Act, 21 U.S.C.section 360bbb-3(b)(1), unless the authorization is terminated  or revoked sooner.       Influenza A by PCR NEGATIVE NEGATIVE Final   Influenza B by PCR NEGATIVE NEGATIVE Final    Comment: (NOTE) The Xpert Xpress SARS-CoV-2/FLU/RSV plus assay is intended as an aid in the diagnosis of influenza from Nasopharyngeal swab specimens and should not be used as a sole basis for treatment. Nasal washings and aspirates are unacceptable for Xpert Xpress SARS-CoV-2/FLU/RSV testing.  Fact Sheet for Patients: [place]  Fact Sheet for Healthcare Providers: BloggerCourse.com  This test is not yet approved or cleared by the SeriousBroker.it FDA and has been authorized for detection and/or diagnosis of SARS-CoV-2 by FDA under  an Emergency Use Authorization (EUA). This EUA will remain in effect (meaning this test can be used) for the duration of the COVID-19 declaration under Section 564(b)(1) of the Act, 21 U.S.C. section 360bbb-3(b)(1), unless the authorization is terminated or revoked.  Performed at [facility], [address], [place], [place]   MRSA Next Gen by PCR, Nasal     Status: None   Collection Time: 02/22/21 10:45 PM   Specimen: Nasal Mucosa; Nasal Swab  Result Value Ref Range Status   MRSA by PCR Next Gen NOT DETECTED NOT DETECTED Final    Comment: (NOTE) The GeneXpert MRSA Assay (FDA approved for NASAL specimens only), is one component of a comprehensive MRSA colonization surveillance program. It is not intended to diagnose MRSA infection nor to guide or monitor treatment for MRSA infections. Test performance is not FDA approved in patients less than 45 years old. Performed at [facility], [address] [place], [place]   Culture, blood (routine x 2)     Status: None (Preliminary result)   Collection Time: 02/23/21  9:55 AM   Specimen: BLOOD RIGHT HAND  Result Value Ref Range  Status   Specimen Description BLOOD RIGHT HAND  Final   Special Requests   Final    BOTTLES DRAWN AEROBIC AND ANAEROBIC Blood Culture adequate volume   Culture   Final    NO GROWTH 3 DAYS Performed at [facility], [address] [place], [place] [address]    Report Status PENDING  Incomplete  Culture, blood (routine x 2)     Status: None (Preliminary result)   Collection Time: 02/23/21 10:05 AM   Specimen: BLOOD RIGHT HAND  Result Value Ref Range Status   Specimen Description BLOOD RIGHT HAND  Final   Special Requests   Final    BOTTLES DRAWN AEROBIC AND ANAEROBIC Blood Culture adequate volume   Culture   Final    NO GROWTH 3 DAYS Performed at [facility], [address], [place], [place] [address]    Report Status PENDING  Incomplete          Radiology Studies: No results found.    Scheduled Meds:  sodium chloride   Intravenous Once   sodium chloride   Intravenous Once   arformoterol  15 mcg Nebulization BID   budesonide (PULMICORT) nebulizer solution  0.5 mg Nebulization BID   Chlorhexidine Gluconate Cloth  6 each Topical QHS   pantoprazole (PROTONIX) IV  40 mg Intravenous Q12H   Continuous Infusions:  cefTRIAXone (ROCEPHIN)  IV 1 g (02/26/21 1300)     LOS: 4 days      Calvert Cantor, MD [facility] Pager: [email or web address] 02/26/2021, 1:42 PM

## 2021-02-27 ENCOUNTER — Other Ambulatory Visit (HOSPITAL_COMMUNITY): Payer: Self-pay

## 2021-02-27 ENCOUNTER — Telehealth (HOSPITAL_COMMUNITY): Payer: Self-pay | Admitting: Pharmacy Technician

## 2021-02-27 DIAGNOSIS — N179 Acute kidney failure, unspecified: Secondary | ICD-10-CM

## 2021-02-27 DIAGNOSIS — J9622 Acute and chronic respiratory failure with hypercapnia: Secondary | ICD-10-CM | POA: Diagnosis not present

## 2021-02-27 DIAGNOSIS — D509 Iron deficiency anemia, unspecified: Secondary | ICD-10-CM

## 2021-02-27 DIAGNOSIS — J9601 Acute respiratory failure with hypoxia: Secondary | ICD-10-CM | POA: Diagnosis not present

## 2021-02-27 DIAGNOSIS — K279 Peptic ulcer, site unspecified, unspecified as acute or chronic, without hemorrhage or perforation: Secondary | ICD-10-CM

## 2021-02-27 DIAGNOSIS — I441 Atrioventricular block, second degree: Secondary | ICD-10-CM

## 2021-02-27 DIAGNOSIS — I5042 Chronic combined systolic (congestive) and diastolic (congestive) heart failure: Secondary | ICD-10-CM | POA: Diagnosis not present

## 2021-02-27 LAB — BASIC METABOLIC PANEL
Anion gap: 9 (ref 5–15)
BUN: 5 mg/dL — ABNORMAL LOW (ref 8–23)
CO2: 35 mmol/L — ABNORMAL HIGH (ref 22–32)
Calcium: 8.9 mg/dL (ref 8.9–10.3)
Chloride: 91 mmol/L — ABNORMAL LOW (ref 98–111)
Creatinine, Ser: 0.96 mg/dL (ref 0.61–1.24)
GFR, Estimated: >60 mL/min (ref >60)
Glucose, Bld: 90 mg/dL (ref 70–99)
Potassium: 4.2 mmol/L (ref 3.5–5.1)
Sodium: 135 mmol/L (ref 135–145)

## 2021-02-27 MED ORDER — PANTOPRAZOLE SODIUM 40 MG PO TBEC
40.0000 mg | DELAYED_RELEASE_TABLET | Freq: Two times a day (BID) | ORAL | Status: DC
  Administered 2021-02-27 – 2021-03-10 (×23): 40 mg via ORAL
  Filled 2021-02-27 (×23): qty 1

## 2021-02-27 MED ORDER — FUROSEMIDE 40 MG PO TABS
80.0000 mg | ORAL_TABLET | Freq: Every day | ORAL | Status: DC
  Administered 2021-02-28 – 2021-03-02 (×3): 80 mg via ORAL
  Filled 2021-02-27 (×3): qty 2

## 2021-02-27 MED ORDER — EMPAGLIFLOZIN 10 MG PO TABS
10.0000 mg | ORAL_TABLET | Freq: Every day | ORAL | 0 refills | Status: DC
  Filled 2021-02-27: qty 30, 30d supply, fill #0

## 2021-02-27 MED ORDER — EMPAGLIFLOZIN 10 MG PO TABS
10.0000 mg | ORAL_TABLET | Freq: Every day | ORAL | Status: DC
  Administered 2021-02-27 – 2021-03-10 (×12): 10 mg via ORAL
  Filled 2021-02-27 (×13): qty 1

## 2021-02-27 MED ORDER — FUROSEMIDE 80 MG PO TABS
80.0000 mg | ORAL_TABLET | Freq: Every day | ORAL | 0 refills | Status: DC
  Filled 2021-02-27: qty 30, 30d supply, fill #0

## 2021-02-27 MED ORDER — PANTOPRAZOLE SODIUM 40 MG PO TBEC
40.0000 mg | DELAYED_RELEASE_TABLET | Freq: Two times a day (BID) | ORAL | 0 refills | Status: DC
  Filled 2021-02-27: qty 60, 30d supply, fill #0

## 2021-02-27 MED ORDER — SACUBITRIL-VALSARTAN 49-51 MG PO TABS
1.0000 | ORAL_TABLET | Freq: Two times a day (BID) | ORAL | Status: DC
  Administered 2021-02-27 – 2021-03-02 (×8): 1 via ORAL
  Filled 2021-02-27 (×9): qty 1

## 2021-02-27 MED ORDER — SACUBITRIL-VALSARTAN 49-51 MG PO TABS
1.0000 | ORAL_TABLET | Freq: Two times a day (BID) | ORAL | 0 refills | Status: DC
  Filled 2021-02-27: qty 60, 30d supply, fill #0

## 2021-02-27 NOTE — [place] Notes (Signed)
Placed patient on CPAP via FFM, previous settings (auto-titrate) with 2L O2 bleed in.

## 2021-02-27 NOTE — Care Management Important Message (Signed)
Important Message  Patient Details  Name: JAYDEN KRATOCHVIL MRN: [id number] Date of Birth: 1950/12/20   Medicare Important Message Given:  Yes     Mardene Sayer 02/27/2021, 1:08 PM

## 2021-02-27 NOTE — Telephone Encounter (Signed)
Advanced Heart Failure Patient Advocate Encounter  Patient currently admitted and started on Jardiance. Current 30 day co-pay, $47. Patient was approved for a PAN HF grant to help cover the cost. Copy of grant provided to [facility]).  Member ID: [id number] Group ID: [id number] RxBin ID: [id number] PCN: [id number] Eligibility Start Date: 11/29/2020 Eligibility End Date: 02/26/2022 Assistance Amount: $1,000.00  Of note, patient is currently receiving Entresto from [employer] through 06/10/21.  Archer Asa, CPhT

## 2021-02-27 NOTE — Care Management Important Message (Signed)
Important Message  Patient Details  Name: Kristopher [name] MRN: [id number] Date of Birth: [birth year]   Medicare Important Message Given:  Yes     Kaylie Ritter Stefan Church 02/27/2021, 3:49 PM

## 2021-02-27 NOTE — Progress Notes (Addendum)
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 Admitted 09/15 with acute hypercarbic respiratory failure (off CPAP X 2 months), AKI and possible sepsis.    Subjective:    Feels good. Remains SOB with exertion but at his baseline.   Echo 09/15: LVEF 70-75%, hyperdynamic LV function, biatrial enlargement, mild MR, mild AI, RV okay   Objective:   Weight Range: 106.6 kg Body mass index is 36.81 kg/m.   Vital Signs:   Temp:  [98.3 F (36.8 C)-99.1 F (37.3 C)] 98.3 F (36.8 C) (09/19 0731) Pulse Rate:  [68-93] 84 (09/19 0731) Resp:  [17-20] 17 (09/19 0731) BP: (134-168)/(63-68) 139/63 (09/19 0731) SpO2:  [90 %-100 %] 100 % (09/19 0731) Weight:  [106.6 kg] 106.6 kg (09/19 0500) Last BM Date: 02/26/21  Weight change: Filed Weights   02/23/21 0309 02/26/21 0412 02/27/21 0500  Weight: 107.2 kg 106.8 kg 106.6 kg    Intake/Output:   Intake/Output Summary (Last 24 hours) at 02/27/2021 0851 Last data filed at 02/27/2021 0643 Gross per 24 hour  Intake 1080 ml  Output 2050 ml  Net -970 ml      Physical Exam   General:  No resp difficulty HEENT: normal Neck: supple. JVP does not appear elevated. . Carotids 2+ bilat; no bruits. No lymphadenopathy or thryomegaly appreciated. Cor: PMI nondisplaced. Regular rate & rhythm. No rubs, gallops or murmurs. Lungs: Deceased in the based on 2 liters oxygen.  Abdomen: soft, nontender, nondistended. No hepatosplenomegaly. No bruits or masses. Good bowel sounds. Extremities: no cyanosis, clubbing, rash, edema Neuro: alert & orientedx3, cranial nerves grossly intact. moves all 4 extremities w/o difficulty. Affect pleasant]   Telemetry   SR with occasional PVCs.   EKG    No new  Labs    CBC Recent Labs    02/25/21 0047 02/26/21 0159  WBC  13.8* 11.1*  HGB 8.3* 8.3*  HCT 32.2* 31.9*  MCV 79.5* 81.4  PLT 523* 297   Basic Metabolic Panel Recent Labs    86/76/19 0047 02/25/21 1741 02/27/21 0236  NA 139 138 135  K 5.4* 4.4 4.2  CL 98 94* 91*  CO2 35* 37* 35*  GLUCOSE 99 106* 90  BUN 21 12 5*  CREATININE 1.24 1.04 0.96  CALCIUM 9.3 9.1 8.9  MG 2.8*  --   --   PHOS 3.2  --   --    Liver Function Tests No results for input(s): AST, ALT, ALKPHOS, BILITOT, PROT, ALBUMIN in the last 72 hours.  No results for input(s): LIPASE, AMYLASE in the last 72 hours. Cardiac Enzymes No results for input(s): CKTOTAL, CKMB, CKMBINDEX, TROPONINI in the last 72 hours.  BNP: BNP (last 3 results) Recent Labs    02/22/21 1727  BNP 480.0*    ProBNP (last 3 results) No results for input(s): PROBNP in the last 8760 hours.   D-Dimer No results for input(s): DDIMER in the last 72 hours. Hemoglobin A1C No results for input(s): HGBA1C in the last 72 hours. Fasting Lipid Panel No results for input(s): CHOL, HDL, LDLCALC, TRIG, CHOLHDL, LDLDIRECT in the last 72 hours. Thyroid Function Tests No results for input(s): TSH, T4TOTAL, T3FREE, THYROIDAB in the last 72 hours.  Invalid input(s): FREET3  Other results:   Imaging    No results found.  Medications:     Scheduled Medications:  sodium chloride   Intravenous Once   sodium chloride   Intravenous Once   arformoterol  15 mcg Nebulization BID   budesonide (PULMICORT) nebulizer solution  0.5 mg Nebulization BID   Chlorhexidine Gluconate Cloth  6 each Topical QHS   furosemide  80 mg Oral BID   pantoprazole (PROTONIX) IV  40 mg Intravenous Q12H    Infusions:  cefTRIAXone (ROCEPHIN)  IV 1 g (02/26/21 1300)    PRN Medications: albuterol, docusate sodium, hydrocortisone cream, polyethylene glycol    Assessment/Plan  Acute Hypercarbic Respiratory Failure - Has known severe OSA + COPD . Possible CAP. - Home CPAP machine broken for the last 2 months and cant  afford to replace. TOC team working on home CPAP.  - Hypoxemic on arrival to ED. Placed on Bipap. CO2 77.5, bicarb 35.6, PH 7.2 on bicarb.  - now off BiPAP,  - Now on 2 liters today. Home oxygen  - Steroids. + empiric antibiotics.    2. Possible Sepsis -Hypotensive on arrival. Lactic acid 0.8. Procalcitonin 0.4>0.2. WBC 15.4 > 11.1 -Blood Cx obtained 9/15 -On empiric antibiotics   -HF meds were held. Now hypertensive. See below.    3. Anemia  -Hgb 6.3 on admit. Overall received 2URPBCs.  - Hgb today 8.3  -EGD showed gastric ulcer without bleeding and gastitis -11/06/19 Had EGD and colonoscopy Had polyps and diverticulosis.    4. A/C Systolic Heart Failure, NICM  -Had LHC 2020 with minimal non obstructive CAD -EF has been < 35% for a few years. He declined ICD. Most recent ECHO EF 25-30% and RV mildly reduced.  - Echo this admit  with hyperdynamic LV function, EF > 70%. RV okay. -Admit CXR with possible vascular congestion.  - Given EF improved on HF meds will need resume.  -Volume status stable. Cut back lasix to 80 mg daily with addition of SGLT2i.  - Start jardiance 10 mg daily.  - Hypertensive today. Start back entresto 49-51 mg twice a day.   -BB on hold with wenckebach AV block - Hold off on spironolactone.    5. AKI  - Creatinine 2.2 > 2.05 > 1.70>>0.96 - Creatinine baseline 1.1-1.4  - Holding diuretics/entresto    6. Hyperkalemia  - 9/15 and 9/17 given lokelma  - K 4.2 today.  - We will check BMET next week.   HF meds for D/C Lasix 80 mg daily  Entresto 49-51 mg daily Jardiance 10 mg daily   HF follow on 03/02/21 and we will check BMET at that time.   Length of Stay: 5  Amy Clegg, NP  02/27/2021, 8:51 AM  Advanced Heart Failure Team Pager [phone] (M-F; 7a - 5p)  Please contact [facility] for night-coverage after hours (5p -7a ) and weekends on [email or web address]  Patient seen and examined with the above-signed Advanced Practice Provider and/or Housestaff. I  personally reviewed laboratory data, imaging studies and relevant notes. I independently examined the patient and formulated the important aspects of the plan. I have edited the note to reflect any of my changes or salient points. I have personally discussed the plan with the patient and/or family.  Doing well from cardiac standpoint. BP improved. Denies CP or SOB. EGD ok. Continues with occasional Wenckebach  General:  Well appearing. No resp difficulty HEENT: normal Neck: supple. no JVD. Carotids 2+ bilat; no bruits. No lymphadenopathy or thryomegaly appreciated. Cor: PMI nondisplaced. Regular rate & rhythm. No rubs, gallops or murmurs. Lungs:  clear Abdomen: obese.soft, nontender, nondistended. No hepatosplenomegaly. No bruits or masses. Good bowel sounds. Extremities: no cyanosis, clubbing, rash, edema Neuro: alert & orientedx3, cranial nerves grossly intact. moves all 4 extremities w/o difficulty. Affect pleasant  Stable for d/c today. Meds reviewed with him and HF PharmD. Will arrange HF f/u. Keep off b-block for now with wenckebach.   Arvilla Meres, MD  3:24 PM

## 2021-02-27 NOTE — Discharge Summary (Addendum)
Physician Discharge Summary  Gill Delrossi Whetsel HYW:[id number] DOB: 1951/01/09 DOA: 02/22/2021  PCP: Shirlean Mylar, MD  Admit date: 02/22/2021 Discharge date: 02/27/2021  Admitted From: home  Disposition:  home   Recommendations for Outpatient Follow-up:  F/u compliance with Trilogy vent Recommend repeat Hgb in 2 wks and Iron levels in 1 month  Discharge Condition:  stable   CODE STATUS:  full code   Diet recommendation:  heart healthy Consultations: Heart failure team PCCM  Procedures/Studies: EGD   Discharge Diagnoses:  Principal Problem:   Acute on chronic respiratory failure with hypercapnia (HCC) Active Problems:   Wenckebach block   AKI (acute kidney injury) (HCC)   DCM (dilated cardiomyopathy) (HCC)   PUD (peptic ulcer disease)   OSA (obstructive sleep apnea)   Morbid obesity (HCC)   Symptomatic anemia   COPD (chronic obstructive pulmonary disease) (HCC)       Brief Summary: VICTORHUGO PREIS is a 70 year old male with nonischemic cardiomyopathy and EF of 30 to 35% who has declined an ICD, severe obstructive sleep apnea on CPAP, COPD, iron deficiency anemia, presented to the ED on 9/14 for shortness of breath x3 weeks intermittent but progressive.  EMS found him to have oxygen saturations in the 70s and placed him on 3 L of nasal cannula.  At baseline he is able to walk about 35 minutes but over the past few days he was barely able to walk 10 minutes due to dyspnea on exertion.  He also complained of cough with increased sputum production for the past week.  He is compliant with his CPAP at night however his machine broke a few months ago and he has not been able to replace it.   Chest x-ray revealed cardiomegaly with mild interstitial prominence and small effusions.  Hemoglobin noted to be 6.4 with a baseline of around 8.  He declined to have Hemoccult testing in the ED.  EGD was performed in 5/21 and did not reveal a source for bleeding.  Colonoscopy showed hemorrhoids and  polyps.  Creatinine noted to be 2.23 with a baseline of 1.1.   While in the ED he became lethargic.  He was admitted to critical care. 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 Heart failure team was consulted on 9/15.   2D echo revealed an EF of 70 to 75%, hyperdynamic LV function biatrial enlargement mild MR and mild AI.  He continued to require BiPAP for a number of days.  Eventually was weaned down to a nasal cannula and transferred out of the ICU to the triad hospitalist service. Of note patient was treated empirically with antibiotics and transition to ceftriaxone and azithromycin with a plan to complete a 7-day course. GI was consulted for heme positive stools and anemia.   Of note the patient was admitted from 11/06/2019 - 11/08/2019 for symptomatic anemia secondary to an acute upper GI bleed which resolved spontaneously. Source of bleeding was not found.    Hospital Course:  Principle problem:    Acute on chronic hypoxic and hypercarbic respiratory failure with hypercapnia - multifactorial> CHF, OSA, OHS -Requiring BiPAP initially - He has been weaned down to 2L of oxygen which he will need to be discharged on - Initially given aztreonam and linezolid while in the ICU due to penicillin allergy-currently on ceftriaxone and azithromycin for plan to complete a 7-day course (last day will be 03/01/21)   Active Problems: Obstructive sleep apnea/chronic hypercarbic respiratory failure Obesity hypoventilation - He now needs a  trilogy ventilator which has been provided for him prior to discharge - He understands that he will need to use this every night   Acute on Chronic systolic CHF, NICM - EF 25-30% -2 D ECHO mentioned above - diuretics have been on hold due to AKI has now resolved --Lasix resumed at 80 mg daily instead of twice daily - Jardiance added today at a dose of 10 mg daily - Entresto added today at a dose of 49-51 mg twice daily-he was previously  receiving 97-1 03 - Carvedilol is on hold due to Wenckebach heart block - Aldactone not yet resumed   AKI - Cr steadily improved to normal - baseline ~ 1.1   Wenckebach heart block - intermittent -Carvedilol on hold   Hyperkalemia - given Lokelma on 9/15 - 9/17> potassium rose to 5.4 - given 2 doses of Lokelma - 9/18> K 4.4   PUD Hemoccult positive,  anemia with acute blood loss - Prior history of H. pylori infection which was untreated - Hemoglobin stable at 8- has received 2 U PRBC   - Currently on Protonix twice daily which GI is recommending to continue for 4 wks followed by daily dosing - EGD reveals a gastric ulcer without bleeding and gastritis - have advised him to avoid NSAIDS and alcohol   Morbid obesity Body mass index is 36.88 kg/m.     Discharge Exam: Vitals:   02/27/21 0545 02/27/21 0731  BP: (!) 148/64 139/63  Pulse: 78 84  Resp:  17  Temp:  98.3 F (36.8 C)  SpO2:  100%   Vitals:   02/27/21 0454 02/27/21 0500 02/27/21 0545 02/27/21 0731  BP: (!) 168/63  (!) 148/64 139/63  Pulse: 93  78 84  Resp: 19   17  Temp: 99.1 F (37.3 C)   98.3 F (36.8 C)  TempSrc: Axillary   Oral  SpO2: 93%   100%  Weight:  106.6 kg    Height:        General: Pt is alert, awake, not in acute distress Cardiovascular: RRR, S1/S2 +, no rubs, no gallops Respiratory: CTA bilaterally, no wheezing, no rhonchi Abdominal: Soft, NT, ND, bowel sounds + Extremities: no edema, no cyanosis   Discharge Instructions  Discharge Instructions     Diet - low sodium heart healthy   Complete by: As directed    Increase activity slowly   Complete by: As directed    No wound care   Complete by: As directed       Allergies as of 02/27/2021       Reactions   Penicillins    Pt tolerated ceftriaxone. Pt reports it makes him feel shaky Has patient had a PCN reaction causing immediate rash, facial/tongue/throat swelling, SOB or lightheadedness with hypotension: YES Has patient  had a PCN reaction causing severe rash involving mucus membranes or skin necrosis: NO Has patient had a PCN reaction that required hospitalization NO Has patient had a PCN reaction occurring within the last 10 years: NO If all of the above answers are "NO", then may proceed with Cephalosporin use.        Medication List     STOP taking these medications    carvedilol 6.25 MG tablet Commonly known as: COREG   sacubitril-valsartan 97-103 MG Commonly known as: ENTRESTO Replaced by: sacubitril-valsartan 49-51 MG   spironolactone 25 MG tablet Commonly known as: ALDACTONE       TAKE these medications    acetaminophen 325 MG tablet Commonly known  as: TYLENOL Take 650 mg by mouth every 6 (six) hours as needed for mild pain.   albuterol (2.5 MG/3ML) 0.083% nebulizer solution Commonly known as: PROVENTIL Inhale 3 mLs into the lungs in the morning and at bedtime.   aspirin 81 MG chewable tablet Chew 1 tablet (81 mg total) by mouth daily.   atorvastatin 40 MG tablet Commonly known as: LIPITOR Take 1 tablet (40 mg total) by mouth daily at 6 PM. Needs appt for further refills   empagliflozin 10 MG Tabs tablet Commonly known as: JARDIANCE Take 1 tablet (10 mg total) by mouth daily. Start taking on: February 28, 2021   ferrous sulfate 325 (65 FE) MG tablet Take 325 mg by mouth daily with breakfast.   furosemide 80 MG tablet Commonly known as: LASIX Take 1 tablet (80 mg total) by mouth daily. Start taking on: February 28, 2021 What changed:  medication strength when to take this   ipratropium 0.02 % nebulizer solution Commonly known as: ATROVENT Take 2.5 mLs (0.5 mg total) by nebulization 2 (two) times daily.   omeprazole 40 MG capsule Commonly known as: PRILOSEC Take 40 mg by mouth daily as needed (gerd).   pantoprazole 40 MG tablet Commonly known as: PROTONIX Take 1 tablet (40 mg total) by mouth 2 (two) times daily before a meal.   potassium chloride 10 MEQ  tablet Commonly known as: KLOR-CON Take 1 tablet (10 mEq total) by mouth daily.   sacubitril-valsartan 49-51 MG Commonly known as: ENTRESTO Take 1 tablet by mouth 2 (two) times daily. Replaces: sacubitril-valsartan 97-103 MG   Symbicort 160-4.5 MCG/ACT inhaler Generic drug: budesonide-formoterol Inhale 1 puff into the lungs in the morning and at bedtime.               Durable Medical Equipment  (From admission, onward)           Start     Ordered   02/25/21 1504  For home use only DME oxygen  Once       Question Answer Comment  Length of Need 6 Months   Mode or (Route) Nasal cannula   Liters per Minute 2   Frequency Continuous (stationary and portable oxygen unit needed)   Oxygen conserving device Yes   Oxygen delivery system Gas      02/25/21 1504            Follow-up Information      [facility] Follow up on 03/02/2021.   Specialty: Cardiology Why: Advanced [facility] at [facility] at 3 pm Entrance C, Garage [address] 4455 Contact information: [address] [id number] mc [place] [address] [phone]        Gastroenterology, Deboraha Sprang. Schedule an appointment as soon as possible for a visit in 6 week(s).   Why: Gastric ulcer, history of H. pylori infection, anemia Contact information: [address] [place] [address] [phone]                Allergies  Allergen Reactions   Penicillins     Pt tolerated ceftriaxone. Pt reports it makes him feel shaky Has patient had a PCN reaction causing immediate rash, facial/tongue/throat swelling, SOB or lightheadedness with hypotension: YES Has patient had a PCN reaction causing severe rash involving mucus membranes or skin necrosis: NO Has patient had a PCN reaction that required hospitalization NO Has patient had a PCN reaction occurring within the last 10 years: NO If all of the  above answers are "NO", then may  proceed with Cephalosporin use.      DG Chest 2 View  Result Date: 02/22/2021 CLINICAL DATA:  Shortness of breath. EXAM: CHEST - 2 VIEW COMPARISON:  Chest x-ray 07/30/2018. FINDINGS: The heart is enlarged. There is central pulmonary vascular congestion. There is no focal lung infiltrate, pleural effusion or pneumothorax. There are minimal atelectatic changes in the lung bases. No acute fractures are seen. Degenerative changes affect the spine. IMPRESSION: Cardiomegaly with central pulmonary vascular congestion. Electronically Signed   By: Darliss Cheney M.D.   On: 02/22/2021 17:55   DG Chest Port 1 View  Result Date: 02/24/2021 CLINICAL DATA:  Acute respiratory failure EXAM: PORTABLE CHEST 1 VIEW COMPARISON:  Chest radiograph 02/22/2021 FINDINGS: The heart is mildly enlarged, unchanged. The mediastinal contours are stable. Lung volumes are low. There is a small left pleural effusion with adjacent airspace disease, similar to the study from two days ago. There is no new focal airspace disease. There is no significant right pleural effusion. There is no pneumothorax. There is no acute osseous abnormality. IMPRESSION: Low lung volumes with a small left pleural effusion and adjacent airspace disease, not significantly changed. Electronically Signed   By: Lesia Hausen M.D.   On: 02/24/2021 08:32   ECHOCARDIOGRAM COMPLETE  Result Date: 02/23/2021    ECHOCARDIOGRAM REPORT   Patient Name:   LESLEY GALENTINE [facility] Date of Exam: 02/23/2021 Medical Rec #:  [id number]     Height:       67.0 in Accession #:    [id number]    Weight:       236.3 lb Date of Birth:  [birth year]     BSA:          2.171 m Patient Age:    69 years      BP:           93/53 mmHg Patient Gender: M             HR:           62 bpm. Exam Location:  Inpatient Procedure: 2D Echo Indications:    acute respiratory distress  History:        Patient has prior history of Echocardiogram examinations, most                 recent 11/13/2018. Cardiomyopathy, CAD, COPD  and Pulmonary HTN,                 Arrythmias:LBBB, Signs/Symptoms:Shortness of Breath; Risk                 Factors:Hypertension, Dyslipidemia and Sleep Apnea.  Sonographer:    Delcie Roch RDCS Referring Phys: [id number] Clarene Critchley [facility]  Sonographer Comments: Technically difficult study due to poor echo windows. Image acquisition challenging due to patient body habitus. IMPRESSIONS  1. Left ventricular ejection fraction, by estimation, is 70 to 75%. The left ventricle has hyperdynamic function. The left ventricle has no regional wall motion abnormalities. There is mild left ventricular hypertrophy. Left ventricular diastolic parameters are indeterminate.  2. Right ventricular systolic function is normal. The right ventricular size is normal.  3. Left atrial size was moderately dilated.  4. Right atrial size was moderately dilated.  5. The mitral valve is normal in structure. Mild mitral valve regurgitation.  6. The aortic valve is tricuspid. Aortic valve regurgitation is mild. Mild aortic valve sclerosis is present, with no evidence of aortic valve stenosis.  7. The inferior vena cava is  dilated in size with >50% respiratory variability, suggesting right atrial pressure of 8 mmHg. FINDINGS  Left Ventricle: Left ventricular ejection fraction, by estimation, is 70 to 75%. The left ventricle has hyperdynamic function. The left ventricle has no regional wall motion abnormalities. The left ventricular internal cavity size was normal in size. There is mild left ventricular hypertrophy. Left ventricular diastolic parameters are indeterminate. Right Ventricle: The right ventricular size is normal. Right vetricular wall thickness was not assessed. Right ventricular systolic function is normal. Left Atrium: Left atrial size was moderately dilated. Right Atrium: Right atrial size was moderately dilated. Pericardium: Trivial pericardial effusion is present. Mitral Valve: The mitral valve is normal in structure. Mild mitral  valve regurgitation. Tricuspid Valve: The tricuspid valve is normal in structure. Tricuspid valve regurgitation is mild. Aortic Valve: The aortic valve is tricuspid. Aortic valve regurgitation is mild. Mild aortic valve sclerosis is present, with no evidence of aortic valve stenosis. Pulmonic Valve: The pulmonic valve was normal in structure. Pulmonic valve regurgitation is not visualized. Aorta: The aortic root and ascending aorta are structurally normal, with no evidence of dilitation. Venous: The inferior vena cava is dilated in size with greater than 50% respiratory variability, suggesting right atrial pressure of 8 mmHg. IAS/Shunts: No atrial level shunt detected by color flow Doppler.  LEFT VENTRICLE PLAX 2D LVIDd:         5.50 cm  Diastology LVIDs:         3.10 cm  LV e' medial:    7.83 cm/s LV PW:         1.20 cm  LV E/e' medial:  13.3 LV IVS:        1.10 cm  LV e' lateral:   9.90 cm/s LVOT diam:     2.30 cm  LV E/e' lateral: 10.5 LV SV:         103 LV SV Index:   48 LVOT Area:     4.15 cm  RIGHT VENTRICLE             IVC RV S prime:     14.90 cm/s  IVC diam: 2.50 cm TAPSE (M-mode): 2.3 cm LEFT ATRIUM              Index       RIGHT ATRIUM           Index LA diam:        5.00 cm  2.30 cm/m  RA Area:     23.50 cm LA Vol (A2C):   99.7 ml  45.93 ml/m RA Volume:   81.20 ml  37.40 ml/m LA Vol (A4C):   96.7 ml  44.54 ml/m LA Biplane Vol: 101.0 ml 46.52 ml/m  AORTIC VALVE LVOT Vmax:   118.00 cm/s LVOT Vmean:  76.500 cm/s LVOT VTI:    0.249 m  AORTA Ao Root diam: 2.80 cm Ao Asc diam:  3.30 cm MITRAL VALVE MV Area (PHT): 3.08 cm     SHUNTS MV Decel Time: 246 msec     Systemic VTI:  0.25 m MV E velocity: 104.00 cm/s  Systemic Diam: 2.30 cm MV A velocity: 103.00 cm/s MV E/A ratio:  1.01 Dietrich Pates MD Electronically signed by Dietrich Pates MD Signature Date/Time: 02/23/2021/4:08:10 PM    Final      The results of significant diagnostics from this hospitalization (including imaging, microbiology, ancillary and  laboratory) are listed below for reference.     Microbiology: Recent Results (from the past 240 hour(s))  Resp  Panel by RT-PCR (Flu A&B, Covid) Nasopharyngeal Swab     Status: None   Collection Time: 02/22/21  6:36 PM   Specimen: Nasopharyngeal Swab; Nasopharyngeal(NP) swabs in vial transport medium  Result Value Ref Range Status   SARS Coronavirus 2 by RT PCR NEGATIVE NEGATIVE Final    Comment: (NOTE) SARS-CoV-2 target nucleic acids are NOT DETECTED.  The SARS-CoV-2 RNA is generally detectable in upper respiratory specimens during the acute phase of infection. The lowest concentration of SARS-CoV-2 viral copies this assay can detect is 138 copies/mL. A negative result does not preclude SARS-Cov-2 infection and should not be used as the sole basis for treatment or other patient management decisions. A negative result may occur with  improper specimen collection/handling, submission of specimen other than nasopharyngeal swab, presence of viral mutation(s) within the areas targeted by this assay, and inadequate number of viral copies(<138 copies/mL). A negative result must be combined with clinical observations, patient history, and epidemiological information. The expected result is Negative.  Fact Sheet for Patients:  BloggerCourse.com  Fact Sheet for Healthcare Providers:  SeriousBroker.it  This test is no t yet approved or cleared by the [place] FDA and  has been authorized for detection and/or diagnosis of SARS-CoV-2 by FDA under an Emergency Use Authorization (EUA). This EUA will remain  in effect (meaning this test can be used) for the duration of the COVID-19 declaration under Section 564(b)(1) of the Act, 21 U.S.C.section 360bbb-3(b)(1), unless the authorization is terminated  or revoked sooner.       Influenza A by PCR NEGATIVE NEGATIVE Final   Influenza B by PCR NEGATIVE NEGATIVE Final    Comment: (NOTE) The  Xpert Xpress SARS-CoV-2/FLU/RSV plus assay is intended as an aid in the diagnosis of influenza from Nasopharyngeal swab specimens and should not be used as a sole basis for treatment. Nasal washings and aspirates are unacceptable for Xpert Xpress SARS-CoV-2/FLU/RSV testing.  Fact Sheet for Patients: BloggerCourse.com  Fact Sheet for Healthcare Providers: SeriousBroker.it  This test is not yet approved or cleared by the [place] FDA and has been authorized for detection and/or diagnosis of SARS-CoV-2 by FDA under an Emergency Use Authorization (EUA). This EUA will remain in effect (meaning this test can be used) for the duration of the COVID-19 declaration under Section 564(b)(1) of the Act, 21 U.S.C. section 360bbb-3(b)(1), unless the authorization is terminated or revoked.  Performed at [facility], [address] [place], [place] [address]   MRSA Next Gen by PCR, Nasal     Status: None   Collection Time: 02/22/21 10:45 PM   Specimen: Nasal Mucosa; Nasal Swab  Result Value Ref Range Status   MRSA by PCR Next Gen NOT DETECTED NOT DETECTED Final    Comment: (NOTE) The GeneXpert MRSA Assay (FDA approved for NASAL specimens only), is one component of a comprehensive MRSA colonization surveillance program. It is not intended to diagnose MRSA infection nor to guide or monitor treatment for MRSA infections. Test performance is not FDA approved in patients less than 73 years old. Performed at [facility], [address] [place], [place] [address]   Culture, blood (routine x 2)     Status: None (Preliminary result)   Collection Time: 02/23/21  9:55 AM   Specimen: BLOOD RIGHT HAND  Result Value Ref Range Status   Specimen Description BLOOD RIGHT HAND  Final   Special Requests   Final    BOTTLES DRAWN AEROBIC AND ANAEROBIC Blood Culture adequate volume  Culture   Final    NO GROWTH 4 DAYS Performed at [facility], [address], [place], [place] [address]    Report Status PENDING  Incomplete  Culture, blood (routine x 2)     Status: None (Preliminary result)   Collection Time: 02/23/21 10:05 AM   Specimen: BLOOD RIGHT HAND  Result Value Ref Range Status   Specimen Description BLOOD RIGHT HAND  Final   Special Requests   Final    BOTTLES DRAWN AEROBIC AND ANAEROBIC Blood Culture adequate volume   Culture   Final    NO GROWTH 4 DAYS Performed at [facility], [address], [place], [place] [address]    Report Status PENDING  Incomplete     Labs: BNP (last 3 results) Recent Labs    02/22/21 1727  BNP 480.0*   Basic Metabolic Panel: Recent Labs  Lab 02/23/21 0426 02/23/21 0859 02/23/21 1354 02/24/21 0159 02/25/21 0047 02/25/21 1741 02/27/21 0236  NA 138   < > 137 137 139 138 135  K 5.5*   < > 5.1 5.0 5.4* 4.4 4.2  CL 97*  --   --  94* 98 94* 91*  CO2 31  --   --  32 35* 37* 35*  GLUCOSE 136*  --   --  100* 99 106* 90  BUN 32*  --   --  38* 21 12 5*  CREATININE 2.05*  --   --  1.70* 1.24 1.04 0.96  CALCIUM 8.7*  --   --  9.3 9.3 9.1 8.9  MG 2.7*  --   --  2.7* 2.8*  --   --   PHOS 7.1*  --   --  4.4 3.2  --   --    < > = values in this interval not displayed.   Liver Function Tests: Recent Labs  Lab 02/22/21 1727  AST 12*  ALT 6  ALKPHOS 55  BILITOT 0.6  PROT 6.9  ALBUMIN 3.7   No results for input(s): LIPASE, AMYLASE in the last 168 hours. No results for input(s): AMMONIA in the last 168 hours. CBC: Recent Labs  Lab 02/22/21 1727 02/22/21 1915 02/23/21 0426 02/23/21 0859 02/23/21 1354 02/24/21 0159 02/24/21 1046 02/25/21 0047 02/26/21 0159  WBC 15.0*  --  15.4*  --   --  11.1*  --  13.8* 11.1*  NEUTROABS 12.1*  --   --   --   --   --   --   --   --   HGB 6.4*   < > 7.4*   < > 9.2* 6.8* 8.1* 8.3* 8.3*  HCT 26.3*   < > 29.2*   < > 27.0* 26.0* 30.6* 32.2* 31.9*  MCV 75.4*  --  76.8*  --   --  76.7*  --  79.5* 81.4  PLT 675*  --  619*   --   --  541*  --  523* 297   < > = values in this interval not displayed.   Cardiac Enzymes: No results for input(s): CKTOTAL, CKMB, CKMBINDEX, TROPONINI in the last 168 hours. BNP: Invalid input(s): POCBNP CBG: Recent Labs  Lab 02/23/21 0734 02/23/21 1150 02/23/21 1548 02/23/21 2333 02/24/21 0308  GLUCAP 126* 150* 122* 106* 94   D-Dimer No results for input(s): DDIMER in the last 72 hours. Hgb A1c No results for input(s): HGBA1C in the last 72 hours. Lipid Profile No results for input(s): CHOL, HDL, LDLCALC, TRIG,  CHOLHDL, LDLDIRECT in the last 72 hours. Thyroid function studies No results for input(s): TSH, T4TOTAL, T3FREE, THYROIDAB in the last 72 hours.  Invalid input(s): FREET3 Anemia work up No results for input(s): VITAMINB12, FOLATE, FERRITIN, TIBC, IRON, RETICCTPCT in the last 72 hours. Urinalysis    Component Value Date/Time   COLORURINE YELLOW 02/22/2021 2051   APPEARANCEUR CLEAR 02/22/2021 2051   LABSPEC 1.012 02/22/2021 2051   PHURINE 5.0 02/22/2021 2051   GLUCOSEU NEGATIVE 02/22/2021 2051   HGBUR NEGATIVE 02/22/2021 2051   BILIRUBINUR NEGATIVE 02/22/2021 2051   KETONESUR NEGATIVE 02/22/2021 2051   PROTEINUR NEGATIVE 02/22/2021 2051   NITRITE NEGATIVE 02/22/2021 2051   LEUKOCYTESUR NEGATIVE 02/22/2021 2051   Sepsis Labs Invalid input(s): PROCALCITONIN,  WBC,  LACTICIDVEN Microbiology Recent Results (from the past 240 hour(s))  Resp Panel by RT-PCR (Flu A&B, Covid) Nasopharyngeal Swab     Status: None   Collection Time: 02/22/21  6:36 PM   Specimen: Nasopharyngeal Swab; Nasopharyngeal(NP) swabs in vial transport medium  Result Value Ref Range Status   SARS Coronavirus 2 by RT PCR NEGATIVE NEGATIVE Final    Comment: (NOTE) SARS-CoV-2 target nucleic acids are NOT DETECTED.  The SARS-CoV-2 RNA is generally detectable in upper respiratory specimens during the acute phase of infection. The lowest concentration of SARS-CoV-2 viral copies this assay can  detect is 138 copies/mL. A negative result does not preclude SARS-Cov-2 infection and should not be used as the sole basis for treatment or other patient management decisions. A negative result may occur with  improper specimen collection/handling, submission of specimen other than nasopharyngeal swab, presence of viral mutation(s) within the areas targeted by this assay, and inadequate number of viral copies(<138 copies/mL). A negative result must be combined with clinical observations, patient history, and epidemiological information. The expected result is Negative.  Fact Sheet for Patients:  BloggerCourse.com  Fact Sheet for Healthcare Providers:  SeriousBroker.it  This test is no t yet approved or cleared by the [place] FDA and  has been authorized for detection and/or diagnosis of SARS-CoV-2 by FDA under an Emergency Use Authorization (EUA). This EUA will remain  in effect (meaning this test can be used) for the duration of the COVID-19 declaration under Section 564(b)(1) of the Act, 21 U.S.C.section 360bbb-3(b)(1), unless the authorization is terminated  or revoked sooner.       Influenza A by PCR NEGATIVE NEGATIVE Final   Influenza B by PCR NEGATIVE NEGATIVE Final    Comment: (NOTE) The Xpert Xpress SARS-CoV-2/FLU/RSV plus assay is intended as an aid in the diagnosis of influenza from Nasopharyngeal swab specimens and should not be used as a sole basis for treatment. Nasal washings and aspirates are unacceptable for Xpert Xpress SARS-CoV-2/FLU/RSV testing.  Fact Sheet for Patients: BloggerCourse.com  Fact Sheet for Healthcare Providers: SeriousBroker.it  This test is not yet approved or cleared by the [place] FDA and has been authorized for detection and/or diagnosis of SARS-CoV-2 by FDA under an Emergency Use Authorization (EUA). This EUA will remain in  effect (meaning this test can be used) for the duration of the COVID-19 declaration under Section 564(b)(1) of the Act, 21 U.S.C. section 360bbb-3(b)(1), unless the authorization is terminated or revoked.  Performed at [facility], [address], [place], [place] [address]   MRSA Next Gen by PCR, Nasal     Status: None   Collection Time: 02/22/21 10:45 PM   Specimen: Nasal Mucosa; Nasal Swab  Result Value Ref Range Status   MRSA  by PCR Next Gen NOT DETECTED NOT DETECTED Final    Comment: (NOTE) The GeneXpert MRSA Assay (FDA approved for NASAL specimens only), is one component of a comprehensive MRSA colonization surveillance program. It is not intended to diagnose MRSA infection nor to guide or monitor treatment for MRSA infections. Test performance is not FDA approved in patients less than 63 years old. Performed at [facility], [address], [place], [place] [address]   Culture, blood (routine x 2)     Status: None (Preliminary result)   Collection Time: 02/23/21  9:55 AM   Specimen: BLOOD RIGHT HAND  Result Value Ref Range Status   Specimen Description BLOOD RIGHT HAND  Final   Special Requests   Final    BOTTLES DRAWN AEROBIC AND ANAEROBIC Blood Culture adequate volume   Culture   Final    NO GROWTH 4 DAYS Performed at [facility], [address] [place], [place] [address]    Report Status PENDING  Incomplete  Culture, blood (routine x 2)     Status: None (Preliminary result)   Collection Time: 02/23/21 10:05 AM   Specimen: BLOOD RIGHT HAND  Result Value Ref Range Status   Specimen Description BLOOD RIGHT HAND  Final   Special Requests   Final    BOTTLES DRAWN AEROBIC AND ANAEROBIC Blood Culture adequate volume   Culture   Final    NO GROWTH 4 DAYS Performed at [facility], [address] [place], [place] [address]    Report Status PENDING  Incomplete     Time coordinating discharge in minutes: 65  SIGNED:   Calvert Cantor,  MD  [facility] 02/27/2021, 11:45 AM

## 2021-02-28 DIAGNOSIS — G4733 Obstructive sleep apnea (adult) (pediatric): Secondary | ICD-10-CM | POA: Diagnosis not present

## 2021-02-28 DIAGNOSIS — J9622 Acute and chronic respiratory failure with hypercapnia: Secondary | ICD-10-CM | POA: Diagnosis not present

## 2021-02-28 DIAGNOSIS — D649 Anemia, unspecified: Secondary | ICD-10-CM | POA: Diagnosis not present

## 2021-02-28 LAB — CULTURE, BLOOD (ROUTINE X 2)
Culture: NO GROWTH
Culture: NO GROWTH
Special Requests: ADEQUATE
Special Requests: ADEQUATE

## 2021-02-28 NOTE — Progress Notes (Signed)
PROGRESS NOTE    Kristopher [name]   AOZ:[id number]  DOB: [birth year]  DOA: 02/22/2021 PCP: Shirlean Mylar, MD   Brief Narrative:  Kristopher [name] is a 70 year old male with nonischemic cardiomyopathy and EF of 30 to 35% who has declined an ICD, severe obstructive sleep apnea on CPAP, COPD, iron deficiency anemia, presented to the ED on 9/14 for shortness of breath x3 weeks intermittent but progressive.  EMS found him to have oxygen saturations in the 70s and placed him on 3 L of nasal cannula.  At baseline he is able to walk about 35 minutes but over the past few days he was barely able to walk 10 minutes due to dyspnea on exertion.  He also complained of cough with increased sputum production for the past week.  He is compliant with his CPAP at night however his machine broke a few months ago and he has not been able to replace it.  Chest x-ray revealed cardiomegaly with mild interstitial prominence and small effusions.  Hemoglobin noted to be 6.4 with a baseline of around 8.  He declined to have Hemoccult testing in the ED.  EGD was performed in 5/21 and did not reveal a source for bleeding.  Colonoscopy showed hemorrhoids and polyps.  Creatinine noted to be 2.23 with a baseline of 1.1.  While in the ED he became lethargic and was started to improve his mental status.  He was admitted to critical care. 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 Heart failure team was consulted on 9/15.  2D echo revealed an EF of 70 to 75%, hyperdynamic LV function biatrial enlargement mild MR and mild AI.  He continued to require BiPAP for a number of days.  Eventually was weaned down to a nasal cannula and transferred out of the ICU to the [facility]. Of note patient was treated empirically with antibiotics and transition to ceftriaxone and azithromycin with a plan to complete a 7-day course. GI was consulted for heme positive stools and anemia.  Of note the patient was admitted  from 11/06/2019 - 11/08/2019 for symptomatic anemia secondary to an acute upper GI bleed which resolved spontaneously. Source of bleeding was not found.  Subjective: He has no complaint other than wanting the CPAP off. He was sitting up at the bedside and wanting to go to the rest room when I evaluated him this AM. He is eating well and no bloody BMs noted. No dyspnea.     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 (HCC) -Requiring BiPAP - He has been weaned down to 2L of oxygen which he will need to be discharged on - Initially given aztreonam and linezolid while in the ICU due to penicillin allergy-currently on ceftriaxone and azithromycin for plan to complete a 7-day course (last day will be 03/01/21)  Active Problems: Obstructive sleep apnea/chronic hypercarbic respiratory failure Obesity hypoventilation - He will need a trilogy ventilator when discharged -I have contacted social work to help arrange this and have been told that it will likely take a few days for insurance to approve it - unfortunately, he cannot be discharged without it  Chronic systolic CHF - EF 25-30% -2 D ECHO mentioned above - cont Lasix, Empagliflozin and Entresto per cardiology/ heart failure team  Hemoccult positive, Iron deficiency anemia with acute blood loss - Prior history of H. pylori infection which was untreated - Hemoglobin stable at  8- has received 2 U PRBC so far - Currently on Protonix twice daily which GI is recommending to continue for 4 wks followed by daily dosing - EGD reveals a gastric ulcer without bleeding and gastritis - have advised him to avoid NSAIDS and alcohol  AKI - Cr steadily improved to normal - baseline ~ 1.1  Wenckebach heart block - intermittent - holding Coreg  Hyperkalemia - given Lokelma on 9/15 - 9/17> potassium rose to 5.4 - given 2 doses of Lokelma - 9/18> K 4.4   Morbid  obesity Body mass index is 36.81 kg/m.   Time spent in minutes: 35 DVT prophylaxis: SCDs Start: 02/22/21 2048 Code Status: Full code Family Communication:  Level of Care: Level of care: Telemetry Medical Disposition Plan:  Status is: Inpatient  Remains inpatient appropriate because:Inpatient level of care appropriate due to severity of illness  Dispo: The patient is from: Home              Anticipated d/c is to: Home              Patient currently is not medically stable to d/c.   Difficult to place patient No  Consultants:  PCCM Heart failure team Procedures:  EGD Antimicrobials:  Anti-infectives (From admission, onward)    Start     Dose/Rate Route Frequency Ordered Stop   02/24/21 1200  cefTRIAXone (ROCEPHIN) 1 g in sodium chloride 0.9 % 100 mL IVPB        1 g 200 mL/hr over 30 Minutes Intravenous Every 24 hours 02/24/21 1036 03/01/21 2359   02/24/21 1200  azithromycin (ZITHROMAX) tablet 500 mg        500 mg Oral Daily 02/24/21 1038 02/26/21 1034   02/24/21 1130  azithromycin (ZITHROMAX) tablet 500 mg  Status:  Discontinued        500 mg Oral Daily 02/24/21 1036 02/24/21 1038   02/23/21 1000  linezolid (ZYVOX) IVPB 600 mg  Status:  Discontinued        600 mg 300 mL/hr over 60 Minutes Intravenous Every 12 hours 02/23/21 0903 02/24/21 1036   02/22/21 2200  aztreonam (AZACTAM) 2 g in sodium chloride 0.9 % 100 mL IVPB  Status:  Discontinued        2 g 200 mL/hr over 30 Minutes Intravenous Every 8 hours 02/22/21 2121 02/24/21 1036        Objective: Vitals:   02/27/21 2232 02/28/21 0759 02/28/21 0850 02/28/21 1302  BP:  129/67  115/65  Pulse: 85 (!) 109 100 93  Resp: 18 19 18 17   Temp:  98.5 F (36.9 C)  98.7 F (37.1 C)  TempSrc:  Oral  Oral  SpO2: 98% 91% 100% 96%  Weight:      Height:        Intake/Output Summary (Last 24 hours) at 02/28/2021 1344 Last data filed at 02/28/2021 0900 Gross per 24 hour  Intake 360 ml  Output 1200 ml  Net -840 ml     Filed Weights   02/23/21 0309 02/26/21 0412 02/27/21 0500  Weight: 107.2 kg 106.8 kg 106.6 kg    Examination: General exam: Appears comfortable  HEENT: PERRLA, oral mucosa moist, no sclera icterus or thrush Respiratory system: Clear to auscultation. Respiratory effort normal. Cardiovascular system: S1 & S2 heard, regular rate and rhythm Gastrointestinal system: Abdomen soft, non-tender, nondistended. Normal bowel sounds   Central nervous system: Alert and oriented. No focal neurological deficits. Extremities: No cyanosis, clubbing or edema Skin: No rashes or  ulcers Psychiatry:  Mood & affect appropriate.      Data Reviewed: I have personally reviewed following labs and imaging studies  CBC: Recent Labs  Lab 02/22/21 1727 02/22/21 1915 02/23/21 0426 02/23/21 0859 02/23/21 1354 02/24/21 0159 02/24/21 1046 02/25/21 0047 02/26/21 0159  WBC 15.0*  --  15.4*  --   --  11.1*  --  13.8* 11.1*  NEUTROABS 12.1*  --   --   --   --   --   --   --   --   HGB 6.4*   < > 7.4*   < > 9.2* 6.8* 8.1* 8.3* 8.3*  HCT 26.3*   < > 29.2*   < > 27.0* 26.0* 30.6* 32.2* 31.9*  MCV 75.4*  --  76.8*  --   --  76.7*  --  79.5* 81.4  PLT 675*  --  619*  --   --  541*  --  523* 297   < > = values in this interval not displayed.    Basic Metabolic Panel: Recent Labs  Lab 02/23/21 0426 02/23/21 0859 02/23/21 1354 02/24/21 0159 02/25/21 0047 02/25/21 1741 02/27/21 0236  NA 138   < > 137 137 139 138 135  K 5.5*   < > 5.1 5.0 5.4* 4.4 4.2  CL 97*  --   --  94* 98 94* 91*  CO2 31  --   --  32 35* 37* 35*  GLUCOSE 136*  --   --  100* 99 106* 90  BUN 32*  --   --  38* 21 12 5*  CREATININE 2.05*  --   --  1.70* 1.24 1.04 0.96  CALCIUM 8.7*  --   --  9.3 9.3 9.1 8.9  MG 2.7*  --   --  2.7* 2.8*  --   --   PHOS 7.1*  --   --  4.4 3.2  --   --    < > = values in this interval not displayed.    GFR: Estimated Creatinine Clearance: 83.3 mL/min (by C-G formula based on SCr of 0.96  mg/dL). Liver Function Tests: Recent Labs  Lab 02/22/21 1727  AST 12*  ALT 6  ALKPHOS 55  BILITOT 0.6  PROT 6.9  ALBUMIN 3.7    No results for input(s): LIPASE, AMYLASE in the last 168 hours. No results for input(s): AMMONIA in the last 168 hours. Coagulation Profile: No results for input(s): INR, PROTIME in the last 168 hours. Cardiac Enzymes: No results for input(s): CKTOTAL, CKMB, CKMBINDEX, TROPONINI in the last 168 hours. BNP (last 3 results) No results for input(s): PROBNP in the last 8760 hours. HbA1C: No results for input(s): HGBA1C in the last 72 hours. CBG: Recent Labs  Lab 02/23/21 0734 02/23/21 1150 02/23/21 1548 02/23/21 2333 02/24/21 0308  GLUCAP 126* 150* 122* 106* 94    Lipid Profile: No results for input(s): CHOL, HDL, LDLCALC, TRIG, CHOLHDL, LDLDIRECT in the last 72 hours. Thyroid Function Tests: No results for input(s): TSH, T4TOTAL, FREET4, T3FREE, THYROIDAB in the last 72 hours. Anemia Panel: No results for input(s): VITAMINB12, FOLATE, FERRITIN, TIBC, IRON, RETICCTPCT in the last 72 hours. Urine analysis:    Component Value Date/Time   COLORURINE YELLOW 02/22/2021 2051   APPEARANCEUR CLEAR 02/22/2021 2051   LABSPEC 1.012 02/22/2021 2051   PHURINE 5.0 02/22/2021 2051   GLUCOSEU NEGATIVE 02/22/2021 2051   HGBUR NEGATIVE 02/22/2021 2051   BILIRUBINUR NEGATIVE 02/22/2021 2051   KETONESUR NEGATIVE  02/22/2021 2051   PROTEINUR NEGATIVE 02/22/2021 2051   NITRITE NEGATIVE 02/22/2021 2051   LEUKOCYTESUR NEGATIVE 02/22/2021 2051   Sepsis Labs: @LABRCNTIP (procalcitonin:4,lacticidven:4) ) Recent Results (from the past 240 hour(s))  Resp Panel by RT-PCR (Flu A&B, Covid) Nasopharyngeal Swab     Status: None   Collection Time: 02/22/21  6:36 PM   Specimen: Nasopharyngeal Swab; Nasopharyngeal(NP) swabs in vial transport medium  Result Value Ref Range Status   SARS Coronavirus 2 by RT PCR NEGATIVE NEGATIVE Final    Comment: (NOTE) SARS-CoV-2 target  nucleic acids are NOT DETECTED.  The SARS-CoV-2 RNA is generally detectable in upper respiratory specimens during the acute phase of infection. The lowest concentration of SARS-CoV-2 viral copies this assay can detect is 138 copies/mL. A negative result does not preclude SARS-Cov-2 infection and should not be used as the sole basis for treatment or other patient management decisions. A negative result may occur with  improper specimen collection/handling, submission of specimen other than nasopharyngeal swab, presence of viral mutation(s) within the areas targeted by this assay, and inadequate number of viral copies(<138 copies/mL). A negative result must be combined with clinical observations, patient history, and epidemiological information. The expected result is Negative.  Fact Sheet for Patients:  BloggerCourse.com  Fact Sheet for Healthcare Providers:  SeriousBroker.it  This test is no t yet approved or cleared by the [place] FDA and  has been authorized for detection and/or diagnosis of SARS-CoV-2 by FDA under an Emergency Use Authorization (EUA). This EUA will remain  in effect (meaning this test can be used) for the duration of the COVID-19 declaration under Section 564(b)(1) of the Act, 21 U.S.C.section 360bbb-3(b)(1), unless the authorization is terminated  or revoked sooner.       Influenza A by PCR NEGATIVE NEGATIVE Final   Influenza B by PCR NEGATIVE NEGATIVE Final    Comment: (NOTE) The Xpert Xpress SARS-CoV-2/FLU/RSV plus assay is intended as an aid in the diagnosis of influenza from Nasopharyngeal swab specimens and should not be used as a sole basis for treatment. Nasal washings and aspirates are unacceptable for Xpert Xpress SARS-CoV-2/FLU/RSV testing.  Fact Sheet for Patients: BloggerCourse.com  Fact Sheet for Healthcare  Providers: SeriousBroker.it  This test is not yet approved or cleared by the [place] FDA and has been authorized for detection and/or diagnosis of SARS-CoV-2 by FDA under an Emergency Use Authorization (EUA). This EUA will remain in effect (meaning this test can be used) for the duration of the COVID-19 declaration under Section 564(b)(1) of the Act, 21 U.S.C. section 360bbb-3(b)(1), unless the authorization is terminated or revoked.  Performed at [facility], [address], [place], [place] [address]   MRSA Next Gen by PCR, Nasal     Status: None   Collection Time: 02/22/21 10:45 PM   Specimen: Nasal Mucosa; Nasal Swab  Result Value Ref Range Status   MRSA by PCR Next Gen NOT DETECTED NOT DETECTED Final    Comment: (NOTE) The GeneXpert MRSA Assay (FDA approved for NASAL specimens only), is one component of a comprehensive MRSA colonization surveillance program. It is not intended to diagnose MRSA infection nor to guide or monitor treatment for MRSA infections. Test performance is not FDA approved in patients less than 65 years old. Performed at [facility], [address] [place], [place] [address]   Culture, blood (routine x 2)     Status: None   Collection Time: 02/23/21  9:55 AM   Specimen: BLOOD RIGHT HAND  Result Value Ref Range Status   Specimen Description BLOOD RIGHT HAND  Final   Special Requests   Final    BOTTLES DRAWN AEROBIC AND ANAEROBIC Blood Culture adequate volume   Culture   Final    NO GROWTH 5 DAYS Performed at [facility], [address] [place], [place] [address]    Report Status 02/28/2021 FINAL  Final  Culture, blood (routine x 2)     Status: None   Collection Time: 02/23/21 10:05 AM   Specimen: BLOOD RIGHT HAND  Result Value Ref Range Status   Specimen Description BLOOD RIGHT HAND  Final   Special Requests   Final    BOTTLES DRAWN AEROBIC AND ANAEROBIC Blood Culture adequate volume    Culture   Final    NO GROWTH 5 DAYS Performed at [facility], [address], [place], [place] [address]    Report Status 02/28/2021 FINAL  Final         Radiology Studies: No results found.    Scheduled Meds:  arformoterol  15 mcg Nebulization BID   budesonide (PULMICORT) nebulizer solution  0.5 mg Nebulization BID   Chlorhexidine Gluconate Cloth  6 each Topical QHS   empagliflozin  10 mg Oral Daily   furosemide  80 mg Oral Daily   pantoprazole  40 mg Oral BID   sacubitril-valsartan  1 tablet Oral BID   Continuous Infusions:  cefTRIAXone (ROCEPHIN)  IV 1 g (02/27/21 1346)     LOS: 6 days      Calvert Cantor, MD [facility] Pager: [email or web address] 02/28/2021, 1:44 PM

## 2021-02-28 NOTE — TOC CM/SW Note (Addendum)
HF TOC CM spoke to [employer] rep, Ian Malkin. Authorization has been submitted for Trilogy/NIV. Waiting insurance approval. Attending updated. Pt will not be able to dc without Trilogy. Isidoro Donning RN3 CCM, Heart Failure TOC CM [phone]

## 2021-03-01 DIAGNOSIS — J9622 Acute and chronic respiratory failure with hypercapnia: Secondary | ICD-10-CM | POA: Diagnosis not present

## 2021-03-01 LAB — SURGICAL PATHOLOGY

## 2021-03-01 NOTE — Plan of Care (Signed)
  Problem: Clinical Measurements: Goal: Ability to maintain clinical measurements within normal limits will improve Outcome: Progressing   

## 2021-03-01 NOTE — Progress Notes (Signed)
PROGRESS NOTE    Kristopher [name]  ZOX:[id number] DOB: 07-18-1950 DOA: 02/22/2021 PCP: Shirlean Mylar, MD   Chief Complaint  Patient presents with   Shortness of Breath   Brief Narrative:  Kristopher [name] is Britt Petroni 70 year old male with nonischemic cardiomyopathy and EF of 30 to 35% who has declined an ICD, severe obstructive sleep apnea on CPAP, COPD, iron deficiency anemia, presented to the ED on 9/14 for shortness of breath x3 weeks intermittent but progressive.  EMS found him to have oxygen saturations in the 70s and placed him on 3 L of nasal cannula.  At baseline he is able to walk about 35 minutes but over the past few days he was barely able to walk 10 minutes due to dyspnea on exertion.  He also complained of cough with increased sputum production for the past week.  He is compliant with his CPAP at night however his machine broke Kristopher [name] few months ago and he has not been able to replace it.   He became somnolent in the ED, requiring bipap.  He improved on bipap.  PCCM was consulted for admission given his need for bipap with hypotension and multiple critical issues.  Creatinine noted to be 2.23 at presentation.   While in the ED he became lethargic.  He was admitted to critical care.  Chest x-ray revealed cardiomegaly with mild interstitial prominence and small effusions.  Hemoglobin noted to be 6.4 with Kristopher [name] baseline of around 8.  He declined to have Hemoccult testing in the ED.  EGD was performed on 10/29/20 and did not reveal Carlinda Ohlson source for bleeding.  Colonoscopy showed hemorrhoids and polyps.  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  Heart failure team was consulted on 9/15.   2D echo revealed an EF of 70 to 75%, hyperdynamic LV function biatrial enlargement mild MR and mild AI.  He continued to require BiPAP for Kimaria Struthers number of days.  Eventually was weaned down to Kalin Kyler nasal cannula and transferred out of the ICU to the [facility] hospital[facility] service.  Of note patient was treated  empirically with antibiotics and transition to ceftriaxone and azithromycin with Kristopher [name] plan to complete Crue Otero 7-day course.  GI was consulted for heme positive stools and anemia.  He's now s/p EGD notable for non bleeding gastric ulcer without stigmata of bleeding.   Assessment & Plan:   Principal Problem:   Acute on chronic respiratory failure with hypercapnia (Kristopher [name]) Active Problems:   DCM (dilated cardiomyopathy) (Kristopher [name])   PUD (peptic ulcer disease)   OSA (obstructive sleep apnea)   Morbid obesity (Kristopher [name])   Symptomatic anemia   COPD (chronic obstructive pulmonary disease) (Kristopher [name])   Wenckebach block   AKI (acute kidney injury) (Kristopher [name])   IDA (iron deficiency anemia)  Acute on chronic hypoxic and hypercarbic respiratory failure with hypercapnia (Kristopher [name]) -  Requiring BiPAP - currently on RA - repeat home o2 screen - treated for pneumonia - Initially given aztreonam and linezolid while in the ICU due to penicillin allergy-currently on ceftriaxone and azithromycin for plan to complete Kristopher [name] 7-day course (last day will be 03/01/21)   Obstructive sleep apnea/chronic hypercarbic respiratory failure Obesity hypoventilation - He will need Kristopher [name] trilogy ventilator when discharged -Previous hospitalist contacted social work to help arrange this and have been told that it will likely take Kristopher [name] few days for insurance to approve it - unfortunately, he cannot be discharged without it   Chronic systolic CHF - EF 25-30% - EF improved now to  70-75%, no RWMA, LV diastolic parameters indeterminate (see report) - cont Lasix, Empagliflozin and Entresto per cardiology/ heart failure team - continue to hold beta blocker with wenkebach    Hemoccult positive, Iron deficiency anemia with acute blood loss - Prior history of H. pylori infection which was untreated - s/p 2 units pRBC -- hb stable follow  -- bx stomach showed chronic gastritis, negative for h pylori - GI recommending BID PPI x4 weeks followed by daily protonix 40 mg  daily - EGD reveals Kristopher [name] gastric ulcer without bleeding and gastritis - have advised him to avoid NSAIDS and alcohol   AKI - Cr steadily improved to normal - baseline ~ 1.1   Wenckebach heart block - intermittent - holding Coreg/beta blocker   Hyperkalemia -improved   Morbid obesity Body mass index is 36.81 kg/m.   DVT prophylaxis: SCD Code Status: full  Family Communication: none at bedside Disposition:   Status is: Inpatient  Remains inpatient appropriate because:Inpatient level of care appropriate due to severity of illness  Dispo: The patient is from: Home              Anticipated d/c is to: Home              Patient currently is medically stable to d/c.   Difficult to place patient No       Consultants:  GI PCCM cardiology  Procedures: Echo IMPRESSIONS     1. Left ventricular ejection fraction, by estimation, is 70 to 75%. The  left ventricle has hyperdynamic function. The left ventricle has no  regional wall motion abnormalities. There is mild left ventricular  hypertrophy. Left ventricular diastolic  parameters are indeterminate.   2. Right ventricular systolic function is normal. The right ventricular  size is normal.   3. Left atrial size was moderately dilated.   4. Right atrial size was moderately dilated.   5. The mitral valve is normal in structure. Mild mitral valve  regurgitation.   6. The aortic valve is tricuspid. Aortic valve regurgitation is mild.  Mild aortic valve sclerosis is present, with no evidence of aortic valve  stenosis.   7. The inferior vena cava is dilated in size with >50% respiratory  variability, suggesting right atrial pressure of 8 mmHg.  Endoscopy Impression - Z-line irregular, 36 cm from the incisors. - Small hiatal hernia. - Non-bleeding gastric ulcer with no stigmata of bleeding. Biopsied. - Gastritis. - Normal duodenal bulb, first portion of the duodenum and second portion of  the duodenum. Recommendation - Return patient to hospital ward for ongoing care. - Soft diet. - Continue present medications. - Await pathology results.  Antimicrobials: Anti-infectives (From admission, onward)    Start     Dose/Rate Route Frequency Ordered Stop   02/24/21 1200  cefTRIAXone (ROCEPHIN) 1 g in sodium chloride 0.9 % 100 mL IVPB        1 g 200 mL/hr over 30 Minutes Intravenous Every 24 hours 02/24/21 1036 03/01/21 1438   02/24/21 1200  azithromycin (ZITHROMAX) tablet 500 mg        500 mg Oral Daily 02/24/21 1038 02/26/21 1034   02/24/21 1130  azithromycin (ZITHROMAX) tablet 500 mg  Status:  Discontinued        500 mg Oral Daily 02/24/21 1036 02/24/21 1038   02/23/21 1000  linezolid (ZYVOX) IVPB 600 mg  Status:  Discontinued        600 mg 300 mL/hr over 60 Minutes Intravenous Every 12 hours 02/23/21 0903 02/24/21  1036   02/22/21 2200  aztreonam (AZACTAM) 2 g in sodium chloride 0.9 % 100 mL IVPB  Status:  Discontinued        2 g 200 mL/hr over 30 Minutes Intravenous Every 8 hours 02/22/21 2121 02/24/21 1036          Subjective: No complaints  Objective: Vitals:   02/28/21 1950 02/28/21 2112 03/01/21 0746 03/01/21 0907  BP: 113/67  126/60   Pulse: 99  (!) 102   Resp: 16  18   Temp: 98.2 F (36.8 C)  98.4 F (36.9 C)   TempSrc: Oral  Oral   SpO2: 92% 100% 97% 92%  Weight:      Height:       No intake or output data in the 24 hours ending 03/01/21 1854 Filed Weights   02/23/21 0309 02/26/21 0412 02/27/21 0500  Weight: 107.2 kg 106.8 kg 106.6 kg    Examination:  General exam: Appears calm and comfortable  Respiratory system: Clear to auscultation. Respiratory effort normal. Cardiovascular system: S1 & S2 heard, RRR. Gastrointestinal system: Abdomen is nondistended, soft and nontender.  Central nervous system: Alert and oriented. No focal neurological deficits. Extremities:no LEE Skin: No rashes, lesions or ulcers Psychiatry: Judgement and insight  appear normal. Mood & affect appropriate.     Data Reviewed: I have personally reviewed following labs and imaging studies  CBC: Recent Labs  Lab 02/23/21 0426 02/23/21 0859 02/23/21 1354 02/24/21 0159 02/24/21 1046 02/25/21 0047 02/26/21 0159  WBC 15.4*  --   --  11.1*  --  13.8* 11.1*  HGB 7.4*   < > 9.2* 6.8* 8.1* 8.3* 8.3*  HCT 29.2*   < > 27.0* 26.0* 30.6* 32.2* 31.9*  MCV 76.8*  --   --  76.7*  --  79.5* 81.4  PLT 619*  --   --  541*  --  523* 297   < > = values in this interval not displayed.    Basic Metabolic Panel: Recent Labs  Lab 02/23/21 0426 02/23/21 0859 02/23/21 1354 02/24/21 0159 02/25/21 0047 02/25/21 1741 02/27/21 0236  NA 138   < > 137 137 139 138 135  K 5.5*   < > 5.1 5.0 5.4* 4.4 4.2  CL 97*  --   --  94* 98 94* 91*  CO2 31  --   --  32 35* 37* 35*  GLUCOSE 136*  --   --  100* 99 106* 90  BUN 32*  --   --  38* 21 12 5*  CREATININE 2.05*  --   --  1.70* 1.24 1.04 0.96  CALCIUM 8.7*  --   --  9.3 9.3 9.1 8.9  MG 2.7*  --   --  2.7* 2.8*  --   --   PHOS 7.1*  --   --  4.4 3.2  --   --    < > = values in this interval not displayed.    GFR: Estimated Creatinine Clearance: 83.3 mL/min (by C-G formula based on SCr of 0.96 mg/dL).  Liver Function Tests: No results for input(s): AST, ALT, ALKPHOS, BILITOT, PROT, ALBUMIN in the last 168 hours.  CBG: Recent Labs  Lab 02/23/21 0734 02/23/21 1150 02/23/21 1548 02/23/21 2333 02/24/21 0308  GLUCAP 126* 150* 122* 106* 94     Recent Results (from the past 240 hour(s))  Resp Panel by RT-PCR (Flu Kristopher [name], Covid) Nasopharyngeal Swab     Status: None   Collection Time: 02/22/21  6:36 PM   Specimen: Nasopharyngeal Swab; Nasopharyngeal(NP) swabs in vial transport medium  Result Value Ref Range Status   SARS Coronavirus 2 by RT PCR NEGATIVE NEGATIVE Final    Comment: (NOTE) SARS-CoV-2 target nucleic acids are NOT DETECTED.  The SARS-CoV-2 RNA is generally detectable in upper respiratory specimens  during the acute phase of infection. The lowest concentration of SARS-CoV-2 viral copies this assay can detect is 138 copies/mL. Kristopher [name] negative result does not preclude SARS-Cov-2 infection and should not be used as the sole basis for treatment or other patient management decisions. Kristopher [name] negative result may occur with  improper specimen collection/handling, submission of specimen other than nasopharyngeal swab, presence of viral mutation(s) within the areas targeted by this assay, and inadequate number of viral copies(<138 copies/mL). Kristopher [name] negative result must be combined with clinical observations, patient history, and epidemiological information. The expected result is Negative.  Fact Sheet for Patients:  BloggerCourse.com  Fact Sheet for Healthcare Providers:  SeriousBroker.it  This test is no t yet approved or cleared by the [place] FDA and  has been authorized for detection and/or diagnosis of SARS-CoV-2 by FDA under an Emergency Use Authorization (EUA). This EUA will remain  in effect (meaning this test can be used) for the duration of the COVID-19 declaration under Section 564(b)(1) of the Act, 21 U.S.C.section 360bbb-3(b)(1), unless the authorization is terminated  or revoked sooner.       Influenza Kristopher [name] by PCR NEGATIVE NEGATIVE Final   Influenza B by PCR NEGATIVE NEGATIVE Final    Comment: (NOTE) The Xpert Xpress SARS-CoV-2/FLU/RSV plus assay is intended as an aid in the diagnosis of influenza from Nasopharyngeal swab specimens and should not be used as Kristopher [name] sole basis for treatment. Nasal washings and aspirates are unacceptable for Xpert Xpress SARS-CoV-2/FLU/RSV testing.  Fact Sheet for Patients: BloggerCourse.com  Fact Sheet for Healthcare Providers: SeriousBroker.it  This test is not yet approved or cleared by the [place] FDA and has been authorized for detection  and/or diagnosis of SARS-CoV-2 by FDA under an Emergency Use Authorization (EUA). This EUA will remain in effect (meaning this test can be used) for the duration of the COVID-19 declaration under Section 564(b)(1) of the Act, 21 U.S.C. section 360bbb-3(b)(1), unless the authorization is terminated or revoked.  Performed at [facility], [address] [place], [place] [address]   MRSA Next Gen by PCR, Nasal     Status: None   Collection Time: 02/22/21 10:45 PM   Specimen: Nasal Mucosa; Nasal Swab  Result Value Ref Range Status   MRSA by PCR Next Gen NOT DETECTED NOT DETECTED Final    Comment: (NOTE) The GeneXpert MRSA Assay (FDA approved for NASAL specimens only), is one component of Kristopher [name] comprehensive MRSA colonization surveillance program. It is not intended to diagnose MRSA infection nor to guide or monitor treatment for MRSA infections. Test performance is not FDA approved in patients less than 40 years old. Performed at [facility], [address] [place], [place] [address]   Culture, blood (routine x 2)     Status: None   Collection Time: 02/23/21  9:55 AM   Specimen: BLOOD RIGHT HAND  Result Value Ref Range Status   Specimen Description BLOOD RIGHT HAND  Final   Special Requests   Final    BOTTLES DRAWN AEROBIC AND ANAEROBIC Blood Culture adequate volume   Culture   Final    NO GROWTH 5 DAYS Performed at [facility], [address] [place], [place] [address]    Report Status 02/28/2021 FINAL  Final  Culture, blood (routine x 2)     Status: None   Collection Time: 02/23/21 10:05 AM   Specimen: BLOOD RIGHT HAND  Result Value Ref Range Status   Specimen Description BLOOD RIGHT HAND  Final   Special Requests   Final    BOTTLES DRAWN AEROBIC AND ANAEROBIC Blood Culture adequate volume   Culture   Final    NO GROWTH 5 DAYS Performed at [facility], [address] [place], [place] [address]    Report Status 02/28/2021 FINAL  Final          Radiology Studies: No results found.      Scheduled Meds:  arformoterol  15 mcg Nebulization BID   budesonide (PULMICORT) nebulizer solution  0.5 mg Nebulization BID   Chlorhexidine Gluconate Cloth  6 each Topical QHS   empagliflozin  10 mg Oral Daily   furosemide  80 mg Oral Daily   pantoprazole  40 mg Oral BID   sacubitril-valsartan  1 tablet Oral BID   Continuous Infusions:   LOS: 7 days    Time spent: over 30 min    Lacretia Nicks, MD [facility]   To contact the attending provider between 7A-7P or the covering provider during after hours 7P-7A, please log into the web site [email or web address] and access using universal [place] password for that web site. If you do not have the password, please call the hospital operator.  03/01/2021, 6:54 PM

## 2021-03-01 NOTE — Plan of Care (Signed)

## 2021-03-02 ENCOUNTER — Encounter (HOSPITAL_COMMUNITY): Payer: Medicare Other

## 2021-03-02 DIAGNOSIS — J9622 Acute and chronic respiratory failure with hypercapnia: Secondary | ICD-10-CM | POA: Diagnosis not present

## 2021-03-02 LAB — MAGNESIUM: Magnesium: 2.5 mg/dL — ABNORMAL HIGH (ref 1.7–2.4)

## 2021-03-02 MED ORDER — DICLOFENAC SODIUM 1 % EX GEL
2.0000 g | Freq: Four times a day (QID) | CUTANEOUS | Status: DC
  Administered 2021-03-02 – 2021-03-04 (×6): 2 g via TOPICAL
  Filled 2021-03-02: qty 100

## 2021-03-02 NOTE — Care Management (Signed)
03-02-21 1129 Case Manager received a call from [employer] that the the Trilogy authorization is still pending with insurance. Case Manager will continue to follow for additional transition of care needs.

## 2021-03-02 NOTE — Plan of Care (Signed)
  Problem: Clinical Measurements: Goal: Ability to maintain clinical measurements within normal limits will improve Outcome: Progressing   

## 2021-03-02 NOTE — Care Management Important Message (Signed)
Important Message  Patient Details  Name: Kristopher [name] MRN: [id number] Date of Birth: Mar 01, 1951   Medicare Important Message Given:  Yes     Addaleigh Nicholls Stefan Church 03/02/2021, 3:35 PM

## 2021-03-02 NOTE — Plan of Care (Signed)
  Problem: Clinical Measurements: Goal: Ability to maintain clinical measurements within normal limits will improve Outcome: Adequate for Discharge Goal: Will remain free from infection Outcome: Adequate for Discharge Goal: Diagnostic test results will improve Outcome: Adequate for Discharge Goal: Respiratory complications will improve Outcome: Adequate for Discharge Goal: Cardiovascular complication will be avoided Outcome: Adequate for Discharge   

## 2021-03-02 NOTE — Progress Notes (Signed)
PROGRESS NOTE    Kristopher [name]  QIH:[id number] DOB: [birth year] DOA: 02/22/2021 PCP: Shirlean Mylar, MD   Chief Complaint  Patient presents with   Shortness of Breath   Brief Narrative:  [name] is Kristopher [name] 70 year old male with nonischemic cardiomyopathy and EF of 30 to 35% who has declined an ICD, severe obstructive sleep apnea on CPAP, COPD, iron deficiency anemia, presented to the ED on 9/14 for shortness of breath x3 weeks intermittent but progressive.  EMS found him to have oxygen saturations in the 70s and placed him on 3 L of nasal cannula.  At baseline he is able to walk about 35 minutes but over the past few days he was barely able to walk 10 minutes due to dyspnea on exertion.  He also complained of cough with increased sputum production for the past week.  He is compliant with his CPAP at night however his machine broke Elowen Debruyn few months ago and he has not been able to replace it.   He became somnolent in the ED, requiring bipap.  He improved on bipap.  PCCM was consulted for admission given his need for bipap with hypotension and multiple critical issues.  Creatinine noted to be 2.23 at presentation.   While in the ED he became lethargic.  He was admitted to critical care.  Chest x-ray revealed cardiomegaly with mild interstitial prominence and small effusions.  Hemoglobin noted to be 6.4 with [name] baseline of around 8.  He declined to have Hemoccult testing in the ED.  EGD was performed on 10/29/20 and did not reveal [name] source for bleeding.  Colonoscopy showed hemorrhoids and polyps.  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  Heart failure team was consulted on 9/15.   2D echo revealed an EF of 70 to 75%, hyperdynamic LV function biatrial enlargement mild MR and mild AI.  He continued to require BiPAP for Jyll Tomaro number of days.  Eventually was weaned down to Jhonatan Lomeli nasal cannula and transferred out of the ICU to the [facility].  Of note patient was treated  empirically with antibiotics and transition to ceftriaxone and azithromycin with Rosabella Edgin plan to complete [name] 7-day course.  GI was consulted for heme positive stools and anemia.  He's now s/p EGD notable for non bleeding gastric ulcer without stigmata of bleeding.   Assessment & Plan:   Principal Problem:   Acute on chronic respiratory failure with hypercapnia (HCC) Active Problems:   DCM (dilated cardiomyopathy) (HCC)   PUD (peptic ulcer disease)   OSA (obstructive sleep apnea)   Morbid obesity (HCC)   Symptomatic anemia   COPD (chronic obstructive pulmonary disease) (HCC)   Wenckebach block   AKI (acute kidney injury) (HCC)   IDA (iron deficiency anemia)  Acute on chronic hypoxic and hypercarbic respiratory failure with hypercapnia (HCC) -  Requiring BiPAP - currently on RA - repeat home o2 screen - treated for pneumonia - Initially given aztreonam and linezolid while in the ICU due to penicillin allergy-currently on ceftriaxone and azithromycin for plan to complete [name] 7-day course (last day will be 03/01/21)   Obstructive sleep apnea/chronic hypercarbic respiratory failure Obesity hypoventilation - He will need Jmarion Christiano trilogy ventilator when discharged -Previous hospitalist contacted social work to help arrange this and have been told that it will likely take Nonnie Pickney few days for insurance to approve it - unfortunately, he cannot be discharged without it   Chronic systolic CHF - EF 25-30% - EF improved now to  70-75%, no RWMA, LV diastolic parameters indeterminate (see report) - cont Lasix, Empagliflozin and Entresto per cardiology/ heart failure team - continue to hold beta blocker with wenkebach    NSVT - 12 beats this AM - holding beta blocker with wenkebach - K>4, mg>2  Hemoccult positive, Iron deficiency anemia with acute blood loss - Prior history of H. pylori infection which was untreated - s/p 2 units pRBC -- hb stable follow  -- bx stomach showed chronic gastritis, negative for h  pylori - GI recommending BID PPI x4 weeks followed by daily protonix 40 mg daily - EGD reveals [name] gastric ulcer without bleeding and gastritis - have advised him to avoid NSAIDS and alcohol   AKI - Cr steadily improved to normal - baseline ~ 1.1   Wenckebach heart block - intermittent - holding Coreg/beta blocker   Hyperkalemia -improved   Morbid obesity Body mass index is 36.81 kg/m.  Back Pain  Right Shoulder pain Trial of voltaren, sounds msk  DVT prophylaxis: SCD Code Status: full  Family Communication: none at bedside Disposition:   Status is: Inpatient  Remains inpatient appropriate because:Inpatient level of care appropriate due to severity of illness  Dispo: The patient is from: Home              Anticipated d/c is to: Home              Patient currently is medically stable to d/c.   Difficult to place patient No       Consultants:  GI PCCM cardiology  Procedures: Echo IMPRESSIONS     1. Left ventricular ejection fraction, by estimation, is 70 to 75%. The  left ventricle has hyperdynamic function. The left ventricle has no  regional wall motion abnormalities. There is mild left ventricular  hypertrophy. Left ventricular diastolic  parameters are indeterminate.   2. Right ventricular systolic function is normal. The right ventricular  size is normal.   3. Left atrial size was moderately dilated.   4. Right atrial size was moderately dilated.   5. The mitral valve is normal in structure. Mild mitral valve  regurgitation.   6. The aortic valve is tricuspid. Aortic valve regurgitation is mild.  Mild aortic valve sclerosis is present, with no evidence of aortic valve  stenosis.   7. The inferior vena cava is dilated in size with >50% respiratory  variability, suggesting right atrial pressure of 8 mmHg.  Endoscopy Impression - Z-line irregular, 36 cm from the incisors. - Small hiatal hernia. - Non-bleeding gastric ulcer with no stigmata of  bleeding. Biopsied. - Gastritis. - Normal duodenal bulb, first portion of the duodenum and second portion of the duodenum. Recommendation - Return patient to hospital ward for ongoing care. - Soft diet. - Continue present medications. - Await pathology results.  Antimicrobials: Anti-infectives (From admission, onward)    Start     Dose/Rate Route Frequency Ordered Stop   02/24/21 1200  cefTRIAXone (ROCEPHIN) 1 g in sodium chloride 0.9 % 100 mL IVPB        1 g 200 mL/hr over 30 Minutes Intravenous Every 24 hours 02/24/21 1036 03/01/21 1438   02/24/21 1200  azithromycin (ZITHROMAX) tablet 500 mg        500 mg Oral Daily 02/24/21 1038 02/26/21 1034   02/24/21 1130  azithromycin (ZITHROMAX) tablet 500 mg  Status:  Discontinued        500 mg Oral Daily 02/24/21 1036 02/24/21 1038   02/23/21 1000  linezolid (ZYVOX) IVPB  600 mg  Status:  Discontinued        600 mg 300 mL/hr over 60 Minutes Intravenous Every 12 hours 02/23/21 0903 02/24/21 1036   02/22/21 2200  aztreonam (AZACTAM) 2 g in sodium chloride 0.9 % 100 mL IVPB  Status:  Discontinued        2 g 200 mL/hr over 30 Minutes Intravenous Every 8 hours 02/22/21 2121 02/24/21 1036          Subjective: C/o lower back pain and right shoulder pain - now resolved  Objective: Vitals:   03/01/21 2028 03/02/21 0839 03/02/21 0839 03/02/21 1307  BP:   (!) 113/55 116/68  Pulse:  (!) 101 (!) 101 98  Resp:  16  17  Temp:   99.1 F (37.3 C) 98.5 F (36.9 C)  TempSrc:   Oral Oral  SpO2: 96% 98% 98% 96%  Weight:      Height:        Intake/Output Summary (Last 24 hours) at 03/02/2021 1839 Last data filed at 03/01/2021 1900 Gross per 24 hour  Intake 240 ml  Output --  Net 240 ml   Filed Weights   02/23/21 0309 02/26/21 0412 02/27/21 0500  Weight: 107.2 kg 106.8 kg 106.6 kg    Examination:  General: No acute distress. Cardiovascular: RRR Lungs: unlabored Abdomen: Soft, nontender, nondistended  Neurological: Alert and  oriented 3. Moves all extremities 4. Cranial nerves II through XII grossly intact. Skin: Warm and dry. No rashes or lesions. Extremities: No clubbing or cyanosis. No edema.    Data Reviewed: I have personally reviewed following labs and imaging studies  CBC: Recent Labs  Lab 02/24/21 0159 02/24/21 1046 02/25/21 0047 02/26/21 0159  WBC 11.1*  --  13.8* 11.1*  HGB 6.8* 8.1* 8.3* 8.3*  HCT 26.0* 30.6* 32.2* 31.9*  MCV 76.7*  --  79.5* 81.4  PLT 541*  --  523* 297    Basic Metabolic Panel: Recent Labs  Lab 02/24/21 0159 02/25/21 0047 02/25/21 1741 02/27/21 0236  NA 137 139 138 135  K 5.0 5.4* 4.4 4.2  CL 94* 98 94* 91*  CO2 32 35* 37* 35*  GLUCOSE 100* 99 106* 90  BUN 38* 21 12 5*  CREATININE 1.70* 1.24 1.04 0.96  CALCIUM 9.3 9.3 9.1 8.9  MG 2.7* 2.8*  --   --   PHOS 4.4 3.2  --   --     GFR: Estimated Creatinine Clearance: 83.3 mL/min (by C-G formula based on SCr of 0.96 mg/dL).  Liver Function Tests: No results for input(s): AST, ALT, ALKPHOS, BILITOT, PROT, ALBUMIN in the last 168 hours.  CBG: Recent Labs  Lab 02/23/21 2333 02/24/21 0308  GLUCAP 106* 94     Recent Results (from the past 240 hour(s))  Resp Panel by RT-PCR (Flu Antaniya Venuti&B, Covid) Nasopharyngeal Swab     Status: None   Collection Time: 02/22/21  6:36 PM   Specimen: Nasopharyngeal Swab; Nasopharyngeal(NP) swabs in vial transport medium  Result Value Ref Range Status   SARS Coronavirus 2 by RT PCR NEGATIVE NEGATIVE Final    Comment: (NOTE) SARS-CoV-2 target nucleic acids are NOT DETECTED.  The SARS-CoV-2 RNA is generally detectable in upper respiratory specimens during the acute phase of infection. The lowest concentration of SARS-CoV-2 viral copies this assay can detect is 138 copies/mL. Kamarie Palma negative result does not preclude SARS-Cov-2 infection and should not be used as the sole basis for treatment or other patient management decisions. Christina Gintz negative result may occur  with  improper specimen  collection/handling, submission of specimen other than nasopharyngeal swab, presence of viral mutation(s) within the areas targeted by this assay, and inadequate number of viral copies(<138 copies/mL). Oluwasemilore Pascuzzi negative result must be combined with clinical observations, patient history, and epidemiological information. The expected result is Negative.  Fact Sheet for Patients:  BloggerCourse.com  Fact Sheet for Healthcare Providers:  SeriousBroker.it  This test is no t yet approved or cleared by the [place] FDA and  has been authorized for detection and/or diagnosis of SARS-CoV-2 by FDA under an Emergency Use Authorization (EUA). This EUA will remain  in effect (meaning this test can be used) for the duration of the COVID-19 declaration under Section 564(b)(1) of the Act, 21 U.S.C.section 360bbb-3(b)(1), unless the authorization is terminated  or revoked sooner.       Influenza Caiya Bettes by PCR NEGATIVE NEGATIVE Final   Influenza B by PCR NEGATIVE NEGATIVE Final    Comment: (NOTE) The Xpert Xpress SARS-CoV-2/FLU/RSV plus assay is intended as an aid in the diagnosis of influenza from Nasopharyngeal swab specimens and should not be used as Marleny Faller sole basis for treatment. Nasal washings and aspirates are unacceptable for Xpert Xpress SARS-CoV-2/FLU/RSV testing.  Fact Sheet for Patients: BloggerCourse.com  Fact Sheet for Healthcare Providers: SeriousBroker.it  This test is not yet approved or cleared by the [place] FDA and has been authorized for detection and/or diagnosis of SARS-CoV-2 by FDA under an Emergency Use Authorization (EUA). This EUA will remain in effect (meaning this test can be used) for the duration of the COVID-19 declaration under Section 564(b)(1) of the Act, 21 U.S.C. section 360bbb-3(b)(1), unless the authorization is terminated or revoked.  Performed at [facility], [address] [place], [place] [address]   MRSA Next Gen by PCR, Nasal     Status: None   Collection Time: 02/22/21 10:45 PM   Specimen: Nasal Mucosa; Nasal Swab  Result Value Ref Range Status   MRSA by PCR Next Gen NOT DETECTED NOT DETECTED Final    Comment: (NOTE) The GeneXpert MRSA Assay (FDA approved for NASAL specimens only), is one component of Marlow Berenguer comprehensive MRSA colonization surveillance program. It is not intended to diagnose MRSA infection nor to guide or monitor treatment for MRSA infections. Test performance is not FDA approved in patients less than 77 years old. Performed at [facility], [address], [place], [place] [address]   Culture, blood (routine x 2)     Status: None   Collection Time: 02/23/21  9:55 AM   Specimen: BLOOD RIGHT HAND  Result Value Ref Range Status   Specimen Description BLOOD RIGHT HAND  Final   Special Requests   Final    BOTTLES DRAWN AEROBIC AND ANAEROBIC Blood Culture adequate volume   Culture   Final    NO GROWTH 5 DAYS Performed at [facility], [address], [place], [place] [address]    Report Status 02/28/2021 FINAL  Final  Culture, blood (routine x 2)     Status: None   Collection Time: 02/23/21 10:05 AM   Specimen: BLOOD RIGHT HAND  Result Value Ref Range Status   Specimen Description BLOOD RIGHT HAND  Final   Special Requests   Final    BOTTLES DRAWN AEROBIC AND ANAEROBIC Blood Culture adequate volume   Culture   Final    NO GROWTH 5 DAYS Performed at [facility], [address] [place], [place] [address]  Report Status 02/28/2021 FINAL  Final         Radiology Studies: No results found.      Scheduled Meds:  arformoterol  15 mcg Nebulization BID   budesonide (PULMICORT) nebulizer solution  0.5 mg Nebulization BID   Chlorhexidine Gluconate Cloth  6 each Topical QHS   diclofenac Sodium  2 g Topical QID   empagliflozin  10 mg Oral Daily   furosemide  80 mg Oral Daily    pantoprazole  40 mg Oral BID   sacubitril-valsartan  1 tablet Oral BID   Continuous Infusions:   LOS: 8 days    Time spent: over 30 min    Lacretia Nicks, MD [facility]   To contact the attending provider between 7A-7P or the covering provider during after hours 7P-7A, please log into the web site [email or web address] and access using universal Okay password for that web site. If you do not have the password, please call the hospital operator.  03/02/2021, 6:39 PM

## 2021-03-03 DIAGNOSIS — J9622 Acute and chronic respiratory failure with hypercapnia: Secondary | ICD-10-CM | POA: Diagnosis not present

## 2021-03-03 LAB — PHOSPHORUS: Phosphorus: 4.2 mg/dL (ref 2.5–4.6)

## 2021-03-03 LAB — URINALYSIS, COMPLETE (UACMP) WITH MICROSCOPIC
Bacteria, UA: NONE SEEN
Bilirubin Urine: NEGATIVE
Glucose, UA: >=500 mg/dL — AB
Hgb urine dipstick: NEGATIVE
Ketones, ur: NEGATIVE mg/dL
Leukocytes,Ua: NEGATIVE
Nitrite: NEGATIVE
Protein, ur: NEGATIVE mg/dL
Specific Gravity, Urine: 1.014 (ref 1.005–1.030)
pH: 6 (ref 5.0–8.0)

## 2021-03-03 LAB — BASIC METABOLIC PANEL
Anion gap: 8 (ref 5–15)
BUN: 15 mg/dL (ref 8–23)
CO2: 33 mmol/L — ABNORMAL HIGH (ref 22–32)
Calcium: 8.8 mg/dL — ABNORMAL LOW (ref 8.9–10.3)
Chloride: 94 mmol/L — ABNORMAL LOW (ref 98–111)
Creatinine, Ser: 1.58 mg/dL — ABNORMAL HIGH (ref 0.61–1.24)
GFR, Estimated: 47 mL/min — ABNORMAL LOW (ref >60)
Glucose, Bld: 98 mg/dL (ref 70–99)
Potassium: 3.7 mmol/L (ref 3.5–5.1)
Sodium: 135 mmol/L (ref 135–145)

## 2021-03-03 LAB — CBC
HCT: 30.3 % — ABNORMAL LOW (ref 39.0–52.0)
Hemoglobin: 8.1 g/dL — ABNORMAL LOW (ref 13.0–17.0)
MCH: 21 pg — ABNORMAL LOW (ref 26.0–34.0)
MCHC: 26.7 g/dL — ABNORMAL LOW (ref 30.0–36.0)
MCV: 78.7 fL — ABNORMAL LOW (ref 80.0–100.0)
Platelets: 284 10*3/uL (ref 150–400)
RBC: 3.85 MIL/uL — ABNORMAL LOW (ref 4.22–5.81)
RDW: 21.3 % — ABNORMAL HIGH (ref 11.5–15.5)
WBC: 9.2 10*3/uL (ref 4.0–10.5)
nRBC: 0 % (ref 0.0–0.2)

## 2021-03-03 LAB — GLUCOSE, CAPILLARY
Glucose-Capillary: 95 mg/dL (ref 70–99)
Glucose-Capillary: 119 mg/dL — ABNORMAL HIGH (ref 70–99)

## 2021-03-03 LAB — MAGNESIUM: Magnesium: 2.7 mg/dL — ABNORMAL HIGH (ref 1.7–2.4)

## 2021-03-03 MED ORDER — SACUBITRIL-VALSARTAN 49-51 MG PO TABS
1.0000 | ORAL_TABLET | Freq: Two times a day (BID) | ORAL | Status: DC
  Administered 2021-03-03 – 2021-03-10 (×16): 1 via ORAL
  Filled 2021-03-03 (×17): qty 1

## 2021-03-03 MED ORDER — FUROSEMIDE 40 MG PO TABS
80.0000 mg | ORAL_TABLET | Freq: Every day | ORAL | Status: DC
  Administered 2021-03-03 – 2021-03-10 (×8): 80 mg via ORAL
  Filled 2021-03-03 (×8): qty 2

## 2021-03-03 NOTE — Progress Notes (Signed)
SATURATION QUALIFICATIONS:  Patient Saturations on Room Air at Rest = 98-99%  Patient Saturations on Room Air while Ambulating = 92% then dropped to 88% at the end of the walk, O2 sat went up to 94% after a few deep breaths.  Pt tolerated ambulation in the hall well.

## 2021-03-03 NOTE — TOC CM/SW Note (Addendum)
HF TOC CM contacted [facility] [employer] to follow up on Trilogy/NIV reference # [id number], requested to expedite auth for DME ref [id number]. Updated 5N Unit Director on pt status. Isidoro Donning RN3 CCM, Heart Failure TOC CM [phone]

## 2021-03-03 NOTE — Progress Notes (Signed)
PROGRESS NOTE    Kristopher [name]  ZOX:[id number] DOB: 1951/04/13 DOA: 02/22/2021 PCP: Shirlean Mylar, MD   Chief Complaint  Patient presents with   Shortness of Breath   Brief Narrative:  Kristopher [name] is Kristopher [name] 70 year old male with nonischemic cardiomyopathy and EF of 30 to 35% who has declined an ICD, severe obstructive sleep apnea on CPAP, COPD, iron deficiency anemia, presented to the ED on 9/14 for shortness of breath x3 weeks intermittent but progressive.  EMS found him to have oxygen saturations in the 70s and placed him on 3 L of nasal cannula.  At baseline he is able to walk about 35 minutes but over the past few days he was barely able to walk 10 minutes due to dyspnea on exertion.  He also complained of cough with increased sputum production for the past week.  He is compliant with his CPAP at night however his machine broke Catrina Fellenz few months ago and he has not been able to replace it.   He became somnolent in the ED, requiring bipap.  He improved on bipap.  PCCM was consulted for admission given his need for bipap with hypotension and multiple critical issues.  Creatinine noted to be 2.23 at presentation.   While in the ED he became lethargic.  He was admitted to critical care.  Chest x-ray revealed cardiomegaly with mild interstitial prominence and small effusions.  Hemoglobin noted to be 6.4 with Tou Hayner baseline of around 8.  He declined to have Hemoccult testing in the ED.  EGD was performed on 10/29/20 and did not reveal Jayne Peckenpaugh source for bleeding.  Colonoscopy showed hemorrhoids and polyps.  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  Heart failure team was consulted on 9/15.   2D echo revealed an EF of 70 to 75%, hyperdynamic LV function biatrial enlargement mild MR and mild AI.  He continued to require BiPAP for Iyari Hagner number of days.  Eventually was weaned down to Christopherjohn Schiele nasal cannula and transferred out of the ICU to the [facility].  Of note patient was treated  empirically with antibiotics and transition to ceftriaxone and azithromycin with Limuel Nieblas plan to complete Criss Bartles 7-day course.  GI was consulted for heme positive stools and anemia.  He's now s/p EGD notable for non bleeding gastric ulcer without stigmata of bleeding.   Assessment & Plan:   Principal Problem:   Acute on chronic respiratory failure with hypercapnia (HCC) Active Problems:   DCM (dilated cardiomyopathy) (HCC)   PUD (peptic ulcer disease)   OSA (obstructive sleep apnea)   Morbid obesity (HCC)   Symptomatic anemia   COPD (chronic obstructive pulmonary disease) (HCC)   Wenckebach block   AKI (acute kidney injury) (HCC)   IDA (iron deficiency anemia)  AKI - Cr steadily improved to normal - baseline ~ 1.1 - up to around 1.6 today, will follow - continue entresto, lasix, jardiance for now - may need to adjust meds if worsening - follow repeat UA  Acute on chronic hypoxic and hypercarbic respiratory failure with hypercapnia (HCC) -  Requiring BiPAP - currently on RA - repeat home o2 screen - treated for pneumonia - Initially given aztreonam and linezolid while in the ICU due to penicillin allergy-currently on ceftriaxone and azithromycin for plan to complete Ralston Venus 7-day course (last day will be 03/01/21)   Obstructive sleep apnea/chronic hypercarbic respiratory failure Obesity hypoventilation - He will need Damere Brandenburg trilogy ventilator when discharged -Previous hospitalist contacted social work to help arrange  this and have been told that it will likely take Torra Pala few days for insurance to approve it - unfortunately, he cannot be discharged without it   Chronic systolic CHF - EF 25-30% - EF improved now to 70-75%, no RWMA, LV diastolic parameters indeterminate (see report) - cont Lasix, Empagliflozin and Entresto per cardiology/ heart failure team - continue to hold beta blocker with wenkebach    NSVT - 12 beats this AM - holding beta blocker with wenkebach - K>4, mg>2  Hemoccult positive,  Iron deficiency anemia with acute blood loss - Prior history of H. pylori infection which was untreated - s/p 2 units pRBC -- hb stable follow  -- bx stomach showed chronic gastritis, negative for h pylori - GI recommending BID PPI x4 weeks followed by daily protonix 40 mg daily - EGD reveals Euell Schiff gastric ulcer without bleeding and gastritis - have advised him to avoid NSAIDS and alcohol   Wenckebach heart block - intermittent - holding Coreg/beta blocker   Hyperkalemia -improved   Morbid obesity Body mass index is 36.81 kg/m.  Back Pain  Right Shoulder pain Trial of voltaren, sounds msk  DVT prophylaxis: SCD Code Status: full  Family Communication: none at bedside Disposition:   Status is: Inpatient  Remains inpatient appropriate because:Inpatient level of care appropriate due to severity of illness  Dispo: The patient is from: Home              Anticipated d/c is to: Home              Patient currently is medically stable to d/c.   Difficult to place patient No       Consultants:  GI PCCM cardiology  Procedures: Echo IMPRESSIONS     1. Left ventricular ejection fraction, by estimation, is 70 to 75%. The  left ventricle has hyperdynamic function. The left ventricle has no  regional wall motion abnormalities. There is mild left ventricular  hypertrophy. Left ventricular diastolic  parameters are indeterminate.   2. Right ventricular systolic function is normal. The right ventricular  size is normal.   3. Left atrial size was moderately dilated.   4. Right atrial size was moderately dilated.   5. The mitral valve is normal in structure. Mild mitral valve  regurgitation.   6. The aortic valve is tricuspid. Aortic valve regurgitation is mild.  Mild aortic valve sclerosis is present, with no evidence of aortic valve  stenosis.   7. The inferior vena cava is dilated in size with >50% respiratory  variability, suggesting right atrial pressure of 8  mmHg.  Endoscopy Impression - Z-line irregular, 36 cm from the incisors. - Small hiatal hernia. - Non-bleeding gastric ulcer with no stigmata of bleeding. Biopsied. - Gastritis. - Normal duodenal bulb, first portion of the duodenum and second portion of the duodenum. Recommendation - Return patient to hospital ward for ongoing care. - Soft diet. - Continue present medications. - Await pathology results.  Antimicrobials: Anti-infectives (From admission, onward)    Start     Dose/Rate Route Frequency Ordered Stop   02/24/21 1200  cefTRIAXone (ROCEPHIN) 1 g in sodium chloride 0.9 % 100 mL IVPB        1 g 200 mL/hr over 30 Minutes Intravenous Every 24 hours 02/24/21 1036 03/01/21 1438   02/24/21 1200  azithromycin (ZITHROMAX) tablet 500 mg        500 mg Oral Daily 02/24/21 1038 02/26/21 1034   02/24/21 1130  azithromycin (ZITHROMAX) tablet 500 mg  Status:  Discontinued        500 mg Oral Daily 02/24/21 1036 02/24/21 1038   02/23/21 1000  linezolid (ZYVOX) IVPB 600 mg  Status:  Discontinued        600 mg 300 mL/hr over 60 Minutes Intravenous Every 12 hours 02/23/21 0903 02/24/21 1036   02/22/21 2200  aztreonam (AZACTAM) 2 g in sodium chloride 0.9 % 100 mL IVPB  Status:  Discontinued        2 g 200 mL/hr over 30 Minutes Intravenous Every 8 hours 02/22/21 2121 02/24/21 1036          Subjective: No complaints today  Objective: Vitals:   03/03/21 0741 03/03/21 0824 03/03/21 0930 03/03/21 1205  BP:  (!) 118/55 114/76 105/62  Pulse:  94 95 100  Resp:  17 18 19   Temp:  98.6 F (37 C) 98.2 F (36.8 C) 98.5 F (36.9 C)  TempSrc:  Oral Oral Oral  SpO2: 98% 97% 98% 100%  Weight:      Height:        Intake/Output Summary (Last 24 hours) at 03/03/2021 1749 Last data filed at 03/02/2021 1932 Gross per 24 hour  Intake 120 ml  Output 500 ml  Net -380 ml   Filed Weights   02/23/21 0309 02/26/21 0412 02/27/21 0500  Weight: 107.2 kg 106.8 kg 106.6 kg     Examination:  General: No acute distress. Cardiovascular: RRR Lungs: unlabored Abdomen: Soft, nontender, nondistended  Neurological: Alert and oriented 3. Moves all extremities 4. Cranial nerves II through XII grossly intact. Skin: Warm and dry. No rashes or lesions. Extremities: No clubbing or cyanosis. No edema.    Data Reviewed: I have personally reviewed following labs and imaging studies  CBC: Recent Labs  Lab 02/25/21 0047 02/26/21 0159 03/03/21 0217  WBC 13.8* 11.1* 9.2  HGB 8.3* 8.3* 8.1*  HCT 32.2* 31.9* 30.3*  MCV 79.5* 81.4 78.7*  PLT 523* 297 284    Basic Metabolic Panel: Recent Labs  Lab 02/25/21 0047 02/25/21 1741 02/27/21 0236 03/02/21 1906 03/03/21 0217  NA 139 138 135  --  135  K 5.4* 4.4 4.2  --  3.7  CL 98 94* 91*  --  94*  CO2 35* 37* 35*  --  33*  GLUCOSE 99 106* 90  --  98  BUN 21 12 5*  --  15  CREATININE 1.24 1.04 0.96  --  1.58*  CALCIUM 9.3 9.1 8.9  --  8.8*  MG 2.8*  --   --  2.5* 2.7*  PHOS 3.2  --   --   --  4.2    GFR: Estimated Creatinine Clearance: 50.6 mL/min (Dalina Samara) (by C-G formula based on SCr of 1.58 mg/dL (H)).  Liver Function Tests: No results for input(s): AST, ALT, ALKPHOS, BILITOT, PROT, ALBUMIN in the last 168 hours.  CBG: Recent Labs  Lab 03/03/21 1202 03/03/21 1608  GLUCAP 95 119*     Recent Results (from the past 240 hour(s))  Resp Panel by RT-PCR (Flu Jacoria Keiffer&B, Covid) Nasopharyngeal Swab     Status: None   Collection Time: 02/22/21  6:36 PM   Specimen: Nasopharyngeal Swab; Nasopharyngeal(NP) swabs in vial transport medium  Result Value Ref Range Status   SARS Coronavirus 2 by RT PCR NEGATIVE NEGATIVE Final    Comment: (NOTE) SARS-CoV-2 target nucleic acids are NOT DETECTED.  The SARS-CoV-2 RNA is generally detectable in upper respiratory specimens during the acute phase of infection. The lowest concentration of  SARS-CoV-2 viral copies this assay can detect is 138 copies/mL. Alejandra Hunt negative result does  not preclude SARS-Cov-2 infection and should not be used as the sole basis for treatment or other patient management decisions. Krizia Flight negative result may occur with  improper specimen collection/handling, submission of specimen other than nasopharyngeal swab, presence of viral mutation(s) within the areas targeted by this assay, and inadequate number of viral copies(<138 copies/mL). Pearce Littlefield negative result must be combined with clinical observations, patient history, and epidemiological information. The expected result is Negative.  Fact Sheet for Patients:  BloggerCourse.com  Fact Sheet for Healthcare Providers:  SeriousBroker.it  This test is no t yet approved or cleared by the [place] FDA and  has been authorized for detection and/or diagnosis of SARS-CoV-2 by FDA under an Emergency Use Authorization (EUA). This EUA will remain  in effect (meaning this test can be used) for the duration of the COVID-19 declaration under Section 564(b)(1) of the Act, 21 U.S.C.section 360bbb-3(b)(1), unless the authorization is terminated  or revoked sooner.       Influenza Ruweyda Macknight by PCR NEGATIVE NEGATIVE Final   Influenza B by PCR NEGATIVE NEGATIVE Final    Comment: (NOTE) The Xpert Xpress SARS-CoV-2/FLU/RSV plus assay is intended as an aid in the diagnosis of influenza from Nasopharyngeal swab specimens and should not be used as Giomar Gusler sole basis for treatment. Nasal washings and aspirates are unacceptable for Xpert Xpress SARS-CoV-2/FLU/RSV testing.  Fact Sheet for Patients: BloggerCourse.com  Fact Sheet for Healthcare Providers: SeriousBroker.it  This test is not yet approved or cleared by the [place] FDA and has been authorized for detection and/or diagnosis of SARS-CoV-2 by FDA under an Emergency Use Authorization (EUA). This EUA will remain in effect (meaning this test can be used) for the  duration of the COVID-19 declaration under Section 564(b)(1) of the Act, 21 U.S.C. section 360bbb-3(b)(1), unless the authorization is terminated or revoked.  Performed at [facility], [address], [place], [place] [address]   MRSA Next Gen by PCR, Nasal     Status: None   Collection Time: 02/22/21 10:45 PM   Specimen: Nasal Mucosa; Nasal Swab  Result Value Ref Range Status   MRSA by PCR Next Gen NOT DETECTED NOT DETECTED Final    Comment: (NOTE) The GeneXpert MRSA Assay (FDA approved for NASAL specimens only), is one component of Eri Mcevers comprehensive MRSA colonization surveillance program. It is not intended to diagnose MRSA infection nor to guide or monitor treatment for MRSA infections. Test performance is not FDA approved in patients less than 64 years old. Performed at [facility], [address], [place], [place] [address]   Culture, blood (routine x 2)     Status: None   Collection Time: 02/23/21  9:55 AM   Specimen: BLOOD RIGHT HAND  Result Value Ref Range Status   Specimen Description BLOOD RIGHT HAND  Final   Special Requests   Final    BOTTLES DRAWN AEROBIC AND ANAEROBIC Blood Culture adequate volume   Culture   Final    NO GROWTH 5 DAYS Performed at [facility], [address], [place], [place] [address]    Report Status 02/28/2021 FINAL  Final  Culture, blood (routine x 2)     Status: None   Collection Time: 02/23/21 10:05 AM   Specimen: BLOOD RIGHT HAND  Result Value Ref Range Status   Specimen Description BLOOD RIGHT HAND  Final   Special Requests   Final  BOTTLES DRAWN AEROBIC AND ANAEROBIC Blood Culture adequate volume   Culture   Final    NO GROWTH 5 DAYS Performed at [facility], [address], [place], [place] [address]    Report Status 02/28/2021 FINAL  Final         Radiology Studies: No results found.      Scheduled Meds:  arformoterol  15 mcg Nebulization BID   budesonide (PULMICORT) nebulizer solution   0.5 mg Nebulization BID   Chlorhexidine Gluconate Cloth  6 each Topical QHS   diclofenac Sodium  2 g Topical QID   empagliflozin  10 mg Oral Daily   furosemide  80 mg Oral Daily   pantoprazole  40 mg Oral BID   sacubitril-valsartan  1 tablet Oral BID   Continuous Infusions:   LOS: 9 days    Time spent: over 30 min    Lacretia Nicks, MD [facility]   To contact the attending provider between 7A-7P or the covering provider during after hours 7P-7A, please log into the web site [email or web address] and access using universal [place] password for that web site. If you do not have the password, please call the hospital operator.  03/03/2021, 5:49 PM

## 2021-03-03 NOTE — Plan of Care (Signed)
  Problem: Clinical Measurements: Goal: Ability to maintain clinical measurements within normal limits will improve Outcome: Progressing   Problem: Education: Goal: Knowledge of General Education information will improve Description: Including pain rating scale, medication(s)/side effects and non-pharmacologic comfort measures Outcome: Progressing   Problem: Nutrition: Goal: Adequate nutrition will be maintained Outcome: Progressing   Problem: Coping: Goal: Level of anxiety will decrease Outcome: Progressing   Problem: Elimination: Goal: Will not experience complications related to bowel motility Outcome: Progressing   Problem: Pain Managment: Goal: General experience of comfort will improve Outcome: Progressing   Problem: Safety: Goal: Ability to remain free from injury will improve Outcome: Progressing   Problem: Skin Integrity: Goal: Risk for impaired skin integrity will decrease Outcome: Progressing

## 2021-03-04 DIAGNOSIS — J9622 Acute and chronic respiratory failure with hypercapnia: Secondary | ICD-10-CM | POA: Diagnosis not present

## 2021-03-04 LAB — CBC
HCT: 29.1 % — ABNORMAL LOW (ref 39.0–52.0)
Hemoglobin: 7.8 g/dL — ABNORMAL LOW (ref 13.0–17.0)
MCH: 20.9 pg — ABNORMAL LOW (ref 26.0–34.0)
MCHC: 26.8 g/dL — ABNORMAL LOW (ref 30.0–36.0)
MCV: 78 fL — ABNORMAL LOW (ref 80.0–100.0)
Platelets: 278 10*3/uL (ref 150–400)
RBC: 3.73 MIL/uL — ABNORMAL LOW (ref 4.22–5.81)
RDW: 20.9 % — ABNORMAL HIGH (ref 11.5–15.5)
WBC: 7.7 10*3/uL (ref 4.0–10.5)
nRBC: 0 % (ref 0.0–0.2)

## 2021-03-04 LAB — BASIC METABOLIC PANEL
Anion gap: 9 (ref 5–15)
BUN: 11 mg/dL (ref 8–23)
CO2: 31 mmol/L (ref 22–32)
Calcium: 8.9 mg/dL (ref 8.9–10.3)
Chloride: 97 mmol/L — ABNORMAL LOW (ref 98–111)
Creatinine, Ser: 1.34 mg/dL — ABNORMAL HIGH (ref 0.61–1.24)
GFR, Estimated: 57 mL/min — ABNORMAL LOW (ref >60)
Glucose, Bld: 95 mg/dL (ref 70–99)
Potassium: 3.8 mmol/L (ref 3.5–5.1)
Sodium: 137 mmol/L (ref 135–145)

## 2021-03-04 NOTE — Progress Notes (Signed)
PROGRESS NOTE    Kristopher [name]  MVH:[id number] DOB: 12-Mar-1951 DOA: 02/22/2021 PCP: Shirlean Mylar, MD   Chief Complaint  Patient presents with   Shortness of Breath   Brief Narrative:  Kristopher [name] is Kristopher [name] 70 year old male with nonischemic cardiomyopathy and EF of 30 to 35% who has declined an ICD, severe obstructive sleep apnea on CPAP, COPD, iron deficiency anemia, presented to the ED on 9/14 for shortness of breath x3 weeks intermittent but progressive.  EMS found him to have oxygen saturations in the 70s and placed him on 3 L of nasal cannula.  At baseline he is able to walk about 35 minutes but over the past few days he was barely able to walk 10 minutes due to dyspnea on exertion.  He also complained of cough with increased sputum production for the past week.  He is compliant with his CPAP at night however his machine broke Kristopher [name] few months ago and he has not been able to replace it.   He became somnolent in the ED, requiring bipap.  He improved on bipap.  PCCM was consulted for admission given his need for bipap with hypotension and multiple critical issues.  Creatinine noted to be 2.23 at presentation.   While in the ED he became lethargic.  He was admitted to critical care.  Chest x-ray revealed cardiomegaly with mild interstitial prominence and small effusions.  Hemoglobin noted to be 6.4 with Emma Schupp baseline of around 8.  He declined to have Hemoccult testing in the ED.  EGD was performed on 10/29/20 and did not reveal Ortencia Askari source for bleeding.  Colonoscopy showed hemorrhoids and polyps.  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  Heart failure team was consulted on 9/15.   2D echo revealed an EF of 70 to 75%, hyperdynamic LV function biatrial enlargement mild MR and mild AI.  He continued to require BiPAP for Esaiah Wanless number of days.  Eventually was weaned down to Kristopher [name] nasal cannula and transferred out of the ICU to the [facility].  Of note patient was treated  empirically with antibiotics and transition to ceftriaxone and azithromycin with Kristopher [name] plan to complete Dima Ferrufino 7-day course.  GI was consulted for heme positive stools and anemia.  He's now s/p EGD notable for non bleeding gastric ulcer without stigmata of bleeding.   Assessment & Plan:   Principal Problem:   Acute on chronic respiratory failure with hypercapnia (HCC) Active Problems:   DCM (dilated cardiomyopathy) (HCC)   PUD (peptic ulcer disease)   OSA (obstructive sleep apnea)   Morbid obesity (HCC)   Symptomatic anemia   COPD (chronic obstructive pulmonary disease) (HCC)   Wenckebach block   AKI (acute kidney injury) (HCC)   IDA (iron deficiency anemia)  AKI - Cr steadily improved to normal - baseline ~ 1.1 - improved today, maintain on cardiac meds - trend - may need to adjust meds if worsening - follow repeat UA (bland)  Acute on chronic hypoxic and hypercarbic respiratory failure with hypercapnia (HCC) -  Requiring BiPAP - currently on RA - repeat home o2 screen (maintains >88 with activity on RA) - treated for pneumonia - Initially given aztreonam and linezolid while in the ICU due to penicillin allergy-currently on ceftriaxone and azithromycin for plan to complete Kristopher [name] 7-day course (last day will be 03/01/21)   Obstructive sleep apnea/chronic hypercarbic respiratory failure Obesity hypoventilation - He will need Layton Tappan trilogy ventilator when discharged -Previous hospitalist contacted social work to help  arrange this and have been told that it will likely take Evamaria Detore few days for insurance to approve it - unfortunately, he cannot be discharged without it   Chronic systolic CHF - EF 25-30% - EF improved now to 70-75%, no RWMA, LV diastolic parameters indeterminate (see report) - cont Lasix, Empagliflozin and Entresto per cardiology/ heart failure team - continue to hold beta blocker with wenkebach    NSVT - 12 beats this AM - holding beta blocker with wenkebach - K>4, mg>2  Hemoccult  positive, Iron deficiency anemia with acute blood loss - Prior history of H. pylori infection which was untreated - s/p 2 units pRBC -- hb stable follow  -- bx stomach showed chronic gastritis, negative for h pylori - GI recommending BID PPI x4 weeks followed by daily protonix 40 mg daily - EGD reveals Odalys Win gastric ulcer without bleeding and gastritis - have advised him to avoid NSAIDS and alcohol   Wenckebach heart block - intermittent - holding Coreg/beta blocker   Hyperkalemia -improved   Morbid obesity Body mass index is 36.81 kg/m.  Back Pain  Right Shoulder pain Trial of voltaren, sounds msk  DVT prophylaxis: SCD Code Status: full  Family Communication: none at bedside Disposition:   Status is: Inpatient  Remains inpatient appropriate because:Inpatient level of care appropriate due to severity of illness  Dispo: The patient is from: Home              Anticipated d/c is to: Home              Patient currently is medically stable to d/c.   Difficult to place patient No       Consultants:  GI PCCM cardiology  Procedures: Echo IMPRESSIONS     1. Left ventricular ejection fraction, by estimation, is 70 to 75%. The  left ventricle has hyperdynamic function. The left ventricle has no  regional wall motion abnormalities. There is mild left ventricular  hypertrophy. Left ventricular diastolic  parameters are indeterminate.   2. Right ventricular systolic function is normal. The right ventricular  size is normal.   3. Left atrial size was moderately dilated.   4. Right atrial size was moderately dilated.   5. The mitral valve is normal in structure. Mild mitral valve  regurgitation.   6. The aortic valve is tricuspid. Aortic valve regurgitation is mild.  Mild aortic valve sclerosis is present, with no evidence of aortic valve  stenosis.   7. The inferior vena cava is dilated in size with >50% respiratory  variability, suggesting right atrial pressure of 8  mmHg.  Endoscopy Impression - Z-line irregular, 36 cm from the incisors. - Small hiatal hernia. - Non-bleeding gastric ulcer with no stigmata of bleeding. Biopsied. - Gastritis. - Normal duodenal bulb, first portion of the duodenum and second portion of the duodenum. Recommendation - Return patient to hospital ward for ongoing care. - Soft diet. - Continue present medications. - Await pathology results.  Antimicrobials: Anti-infectives (From admission, onward)    Start     Dose/Rate Route Frequency Ordered Stop   02/24/21 1200  cefTRIAXone (ROCEPHIN) 1 g in sodium chloride 0.9 % 100 mL IVPB        1 g 200 mL/hr over 30 Minutes Intravenous Every 24 hours 02/24/21 1036 03/01/21 1438   02/24/21 1200  azithromycin (ZITHROMAX) tablet 500 mg        500 mg Oral Daily 02/24/21 1038 02/26/21 1034   02/24/21 1130  azithromycin (ZITHROMAX) tablet 500 mg  Status:  Discontinued        500 mg Oral Daily 02/24/21 1036 02/24/21 1038   02/23/21 1000  linezolid (ZYVOX) IVPB 600 mg  Status:  Discontinued        600 mg 300 mL/hr over 60 Minutes Intravenous Every 12 hours 02/23/21 0903 02/24/21 1036   02/22/21 2200  aztreonam (AZACTAM) 2 g in sodium chloride 0.9 % 100 mL IVPB  Status:  Discontinued        2 g 200 mL/hr over 30 Minutes Intravenous Every 8 hours 02/22/21 2121 02/24/21 1036          Subjective: Feels well, no complaints  Objective: Vitals:   03/03/21 1925 03/03/21 1937 03/04/21 0500 03/04/21 0737  BP:  116/63    Pulse:  86    Resp:  19    Temp:  98.7 F (37.1 C)    TempSrc:      SpO2: 98% 97%  100%  Weight:   105.6 kg   Height:        Intake/Output Summary (Last 24 hours) at 03/04/2021 1259 Last data filed at 03/03/2021 2119 Gross per 24 hour  Intake 220 ml  Output 500 ml  Net -280 ml   Filed Weights   02/26/21 0412 02/27/21 0500 03/04/21 0500  Weight: 106.8 kg 106.6 kg 105.6 kg    Examination:  General: No acute distress. Cardiovascular: RRR Lungs:  unlabored Neurological: Alert and oriented 3. Moves all extremities 4 . Cranial nerves II through XII grossly intact. Extremities: No clubbing or cyanosis. No edema. Psychiatric: Mood and affect are normal.      Data Reviewed: I have personally reviewed following labs and imaging studies  CBC: Recent Labs  Lab 02/26/21 0159 03/03/21 0217 03/04/21 0448  WBC 11.1* 9.2 7.7  HGB 8.3* 8.1* 7.8*  HCT 31.9* 30.3* 29.1*  MCV 81.4 78.7* 78.0*  PLT 297 284 278    Basic Metabolic Panel: Recent Labs  Lab 02/25/21 1741 02/27/21 0236 03/02/21 1906 03/03/21 0217 03/04/21 0448  NA 138 135  --  135 137  K 4.4 4.2  --  3.7 3.8  CL 94* 91*  --  94* 97*  CO2 37* 35*  --  33* 31  GLUCOSE 106* 90  --  98 95  BUN 12 5*  --  15 11  CREATININE 1.04 0.96  --  1.58* 1.34*  CALCIUM 9.1 8.9  --  8.8* 8.9  MG  --   --  2.5* 2.7*  --   PHOS  --   --   --  4.2  --     GFR: Estimated Creatinine Clearance: 59.4 mL/min (Kristopher [name]) (by C-G formula based on SCr of 1.34 mg/dL (H)).  Liver Function Tests: No results for input(s): AST, ALT, ALKPHOS, BILITOT, PROT, ALBUMIN in the last 168 hours.  CBG: Recent Labs  Lab 03/03/21 1202 03/03/21 1608  GLUCAP 95 119*     Recent Results (from the past 240 hour(s))  Resp Panel by RT-PCR (Flu Kristopher [name]&B, Covid) Nasopharyngeal Swab     Status: None   Collection Time: 02/22/21  6:36 PM   Specimen: Nasopharyngeal Swab; Nasopharyngeal(NP) swabs in vial transport medium  Result Value Ref Range Status   SARS Coronavirus 2 by RT PCR NEGATIVE NEGATIVE Final    Comment: (NOTE) SARS-CoV-2 target nucleic acids are NOT DETECTED.  The SARS-CoV-2 RNA is generally detectable in upper respiratory specimens during the acute phase of infection. The lowest concentration of SARS-CoV-2 viral copies this assay  can detect is 138 copies/mL. Kristopher [name] negative result does not preclude SARS-Cov-2 infection and should not be used as the sole basis for treatment or other patient management  decisions. Kristopher [name] negative result may occur with  improper specimen collection/handling, submission of specimen other than nasopharyngeal swab, presence of viral mutation(s) within the areas targeted by this assay, and inadequate number of viral copies(<138 copies/mL). Kristopher [name] negative result must be combined with clinical observations, patient history, and epidemiological information. The expected result is Negative.  Fact Sheet for Patients:  BloggerCourse.com  Fact Sheet for Healthcare Providers:  SeriousBroker.it  This test is no t yet approved or cleared by the [place] FDA and  has been authorized for detection and/or diagnosis of SARS-CoV-2 by FDA under an Emergency Use Authorization (EUA). This EUA will remain  in effect (meaning this test can be used) for the duration of the COVID-19 declaration under Section 564(b)(1) of the Act, 21 U.S.C.section 360bbb-3(b)(1), unless the authorization is terminated  or revoked sooner.       Influenza Kristopher [name] by PCR NEGATIVE NEGATIVE Final   Influenza B by PCR NEGATIVE NEGATIVE Final    Comment: (NOTE) The Xpert Xpress SARS-CoV-2/FLU/RSV plus assay is intended as an aid in the diagnosis of influenza from Nasopharyngeal swab specimens and should not be used as Kristopher [name] sole basis for treatment. Nasal washings and aspirates are unacceptable for Xpert Xpress SARS-CoV-2/FLU/RSV testing.  Fact Sheet for Patients: BloggerCourse.com  Fact Sheet for Healthcare Providers: SeriousBroker.it  This test is not yet approved or cleared by the [place] FDA and has been authorized for detection and/or diagnosis of SARS-CoV-2 by FDA under an Emergency Use Authorization (EUA). This EUA will remain in effect (meaning this test can be used) for the duration of the COVID-19 declaration under Section 564(b)(1) of the Act, 21 U.S.C. section 360bbb-3(b)(1), unless the  authorization is terminated or revoked.  Performed at [facility], [address], [place], [place] [address]   MRSA Next Gen by PCR, Nasal     Status: None   Collection Time: 02/22/21 10:45 PM   Specimen: Nasal Mucosa; Nasal Swab  Result Value Ref Range Status   MRSA by PCR Next Gen NOT DETECTED NOT DETECTED Final    Comment: (NOTE) The GeneXpert MRSA Assay (FDA approved for NASAL specimens only), is one component of Lexianna Weinrich comprehensive MRSA colonization surveillance program. It is not intended to diagnose MRSA infection nor to guide or monitor treatment for MRSA infections. Test performance is not FDA approved in patients less than 6 years old. Performed at [facility], [address] [place], [place] [address]   Culture, blood (routine x 2)     Status: None   Collection Time: 02/23/21  9:55 AM   Specimen: BLOOD RIGHT HAND  Result Value Ref Range Status   Specimen Description BLOOD RIGHT HAND  Final   Special Requests   Final    BOTTLES DRAWN AEROBIC AND ANAEROBIC Blood Culture adequate volume   Culture   Final    NO GROWTH 5 DAYS Performed at [facility], [address], [place], [place] [address]    Report Status 02/28/2021 FINAL  Final  Culture, blood (routine x 2)     Status: None   Collection Time: 02/23/21 10:05 AM   Specimen: BLOOD RIGHT HAND  Result Value Ref Range Status   Specimen Description BLOOD RIGHT HAND  Final   Special Requests   Final    BOTTLES DRAWN AEROBIC AND  ANAEROBIC Blood Culture adequate volume   Culture   Final    NO GROWTH 5 DAYS Performed at [facility], [address] [place], [place] [address]    Report Status 02/28/2021 FINAL  Final         Radiology Studies: No results found.      Scheduled Meds:  arformoterol  15 mcg Nebulization BID   budesonide (PULMICORT) nebulizer solution  0.5 mg Nebulization BID   Chlorhexidine Gluconate Cloth  6 each Topical QHS   diclofenac Sodium  2 g Topical QID    empagliflozin  10 mg Oral Daily   furosemide  80 mg Oral Daily   pantoprazole  40 mg Oral BID   sacubitril-valsartan  1 tablet Oral BID   Continuous Infusions:   LOS: 10 days    Time spent: over 30 min    Lacretia Nicks, MD [facility]   To contact the attending provider between 7A-7P or the covering provider during after hours 7P-7A, please log into the web site [email or web address] and access using universal [place] password for that web site. If you do not have the password, please call the hospital operator.  03/04/2021, 12:59 PM

## 2021-03-04 NOTE — Progress Notes (Signed)
Pt placed cpap on himself and was wearing device when RT entered.  RT will continue to monitor.

## 2021-03-04 NOTE — TOC Progression Note (Signed)
Transition of Care [facility]) - Progression Note    Patient Details  Name: DELBERT DARLEY MRN: [id number] Date of Birth: Jan 19, 1951  [facility] CM/SW Contact  Bess Kinds, RN Phone Number: [phone] 03/04/2021, 10:47 AM  Clinical Narrative:     Sherron Monday with Ian Malkin at [facility] to follow up with authorization for NIV - remains pending.     Barriers to Discharge: No Barriers Identified  Expected Discharge Plan and Services           Expected Discharge Date: 02/27/21               DME Arranged: Oxygen, NIV DME Agency: [facility] Date DME Agency Contacted: 02/25/21 Time DME Agency Contacted: [phone] Representative spoke with at DME Agency: Ian Malkin HH Arranged: NA HH Agency: NA         Social Determinants of Health (SDOH) Interventions    Readmission Risk Interventions No flowsheet data found.

## 2021-03-05 DIAGNOSIS — J9622 Acute and chronic respiratory failure with hypercapnia: Secondary | ICD-10-CM | POA: Diagnosis not present

## 2021-03-05 LAB — BASIC METABOLIC PANEL
Anion gap: 9 (ref 5–15)
BUN: 11 mg/dL (ref 8–23)
CO2: 31 mmol/L (ref 22–32)
Calcium: 9.2 mg/dL (ref 8.9–10.3)
Chloride: 95 mmol/L — ABNORMAL LOW (ref 98–111)
Creatinine, Ser: 1.43 mg/dL — ABNORMAL HIGH (ref 0.61–1.24)
GFR, Estimated: 53 mL/min — ABNORMAL LOW (ref >60)
Glucose, Bld: 93 mg/dL (ref 70–99)
Potassium: 4.1 mmol/L (ref 3.5–5.1)
Sodium: 135 mmol/L (ref 135–145)

## 2021-03-05 LAB — HEMOGLOBIN AND HEMATOCRIT, BLOOD
HCT: 30.5 % — ABNORMAL LOW (ref 39.0–52.0)
Hemoglobin: 8.2 g/dL — ABNORMAL LOW (ref 13.0–17.0)

## 2021-03-05 NOTE — Progress Notes (Signed)
Pt states he will place himself on CPAP. ?

## 2021-03-05 NOTE — Progress Notes (Signed)
Patient would like to speak with MD when rounding today regarding his anemia.

## 2021-03-05 NOTE — Progress Notes (Signed)
PROGRESS NOTE    [name]  JJO:[id number] DOB: 1950/08/08 DOA: 02/22/2021 PCP: Shirlean Mylar, MD   Chief Complaint  Patient presents with   Shortness of Breath   Brief Narrative:  Kristopher [name] is Kristopher [name] 70 year old male with nonischemic cardiomyopathy and EF of 30 to 35% who has declined an ICD, severe obstructive sleep apnea on CPAP, COPD, iron deficiency anemia, presented to the ED on 9/14 for shortness of breath x3 weeks intermittent but progressive.  EMS found him to have oxygen saturations in the 70s and placed him on 3 L of nasal cannula.  At baseline he is able to walk about 35 minutes but over the past few days he was barely able to walk 10 minutes due to dyspnea on exertion.  He also complained of cough with increased sputum production for the past week.  He is compliant with his CPAP at night however his machine broke Kristopher [name] few months ago and he has not been able to replace it.   He became somnolent in the ED, requiring bipap.  He improved on bipap.  PCCM was consulted for admission given his need for bipap with hypotension and multiple critical issues.  Creatinine noted to be 2.23 at presentation.   While in the ED he became lethargic.  He was admitted to critical care.  Chest x-ray revealed cardiomegaly with mild interstitial prominence and small effusions.  Hemoglobin noted to be 6.4 with Kristopher [name] baseline of around 8.  He declined to have Hemoccult testing in the ED.  EGD was performed on 10/29/20 and did not reveal Kristopher [name] source for bleeding.  Colonoscopy showed hemorrhoids and polyps.  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  Heart failure team was consulted on 9/15.   2D echo revealed an EF of 70 to 75%, hyperdynamic LV function biatrial enlargement mild MR and mild AI.  He continued to require BiPAP for Adelynne Joerger number of days.  Eventually was weaned down to Breelle Hollywood nasal cannula and transferred out of the ICU to the triad hospitalist service.  Of note patient was treated  empirically with antibiotics and transition to ceftriaxone and azithromycin with Kristopher [name] plan to complete Kristopher [name] 7-day course.  GI was consulted for heme positive stools and anemia.  He's now s/p EGD notable for non bleeding gastric ulcer without stigmata of bleeding.   Assessment & Plan:   Principal Problem:   Acute on chronic respiratory failure with hypercapnia (HCC) Active Problems:   DCM (dilated cardiomyopathy) (HCC)   PUD (peptic ulcer disease)   OSA (obstructive sleep apnea)   Morbid obesity (HCC)   Symptomatic anemia   COPD (chronic obstructive pulmonary disease) (HCC)   Wenckebach block   AKI (acute kidney injury) (HCC)   IDA (iron deficiency anemia)  AKI - Cr steadily improved to normal - baseline ~ 1.1 - appears stable, will trend - may need to adjust meds if worsening - follow repeat UA (bland)  Acute on chronic hypoxic and hypercarbic respiratory failure with hypercapnia (HCC) -  Requiring BiPAP - currently on RA - repeat home o2 screen (maintains >88 with activity on RA) - treated for pneumonia - Initially given aztreonam and linezolid while in the ICU due to penicillin allergy-currently on ceftriaxone and azithromycin for plan to complete Kristopher [name] 7-day course (last day will be 03/01/21)   Obstructive sleep apnea/chronic hypercarbic respiratory failure Obesity hypoventilation - He will need [name] trilogy ventilator when discharged -Previous hospitalist contacted social work to help arrange this and have  been told that it will likely take Kristopher [name] few days for insurance to approve it - unfortunately, he cannot be discharged without it   Chronic systolic CHF - EF 25-30% - EF improved now to 70-75%, no RWMA, LV diastolic parameters indeterminate (see report) - cont Lasix, Empagliflozin and Entresto per cardiology/ heart failure team - continue to hold beta blocker with wenkebach    NSVT - 12 beats this AM - holding beta blocker with wenkebach - K>4, mg>2  Hemoccult positive, Iron  deficiency anemia with acute blood loss - Prior history of H. pylori infection which was untreated - s/p 2 units pRBC -- hb stable follow  -- bx stomach showed chronic gastritis, negative for h pylori - GI recommending BID PPI x4 weeks followed by daily protonix 40 mg daily - EGD reveals Kristopher [name] gastric ulcer without bleeding and gastritis - have advised him to avoid NSAIDS and alcohol   Wenckebach heart block - intermittent - holding Coreg/beta blocker   Hyperkalemia -improved   Morbid obesity Body mass index is 36.81 kg/m.  Back Pain  Right Shoulder pain Trial of voltaren, sounds msk  Left Ear Lesion Needs dermatology follow up  DVT prophylaxis: SCD Code Status: full  Family Communication: none at bedside Disposition:   Status is: Inpatient  Remains inpatient appropriate because:Inpatient level of care appropriate due to severity of illness  Dispo: The patient is from: Home              Anticipated d/c is to: Home              Patient currently is medically stable to d/c.   Difficult to place patient No       Consultants:  GI PCCM cardiology  Procedures: Echo IMPRESSIONS     1. Left ventricular ejection fraction, by estimation, is 70 to 75%. The  left ventricle has hyperdynamic function. The left ventricle has no  regional wall motion abnormalities. There is mild left ventricular  hypertrophy. Left ventricular diastolic  parameters are indeterminate.   2. Right ventricular systolic function is normal. The right ventricular  size is normal.   3. Left atrial size was moderately dilated.   4. Right atrial size was moderately dilated.   5. The mitral valve is normal in structure. Mild mitral valve  regurgitation.   6. The aortic valve is tricuspid. Aortic valve regurgitation is mild.  Mild aortic valve sclerosis is present, with no evidence of aortic valve  stenosis.   7. The inferior vena cava is dilated in size with >50% respiratory  variability,  suggesting right atrial pressure of 8 mmHg.  Endoscopy Impression - Z-line irregular, 36 cm from the incisors. - Small hiatal hernia. - Non-bleeding gastric ulcer with no stigmata of bleeding. Biopsied. - Gastritis. - Normal duodenal bulb, first portion of the duodenum and second portion of the duodenum. Recommendation - Return patient to hospital ward for ongoing care. - Soft diet. - Continue present medications. - Await pathology results.  Antimicrobials: Anti-infectives (From admission, onward)    Start     Dose/Rate Route Frequency Ordered Stop   02/24/21 1200  cefTRIAXone (ROCEPHIN) 1 g in sodium chloride 0.9 % 100 mL IVPB        1 g 200 mL/hr over 30 Minutes Intravenous Every 24 hours 02/24/21 1036 03/01/21 1438   02/24/21 1200  azithromycin (ZITHROMAX) tablet 500 mg        500 mg Oral Daily 02/24/21 1038 02/26/21 1034   02/24/21 1130  azithromycin (  ZITHROMAX) tablet 500 mg  Status:  Discontinued        500 mg Oral Daily 02/24/21 1036 02/24/21 1038   02/23/21 1000  linezolid (ZYVOX) IVPB 600 mg  Status:  Discontinued        600 mg 300 mL/hr over 60 Minutes Intravenous Every 12 hours 02/23/21 0903 02/24/21 1036   02/22/21 2200  aztreonam (AZACTAM) 2 g in sodium chloride 0.9 % 100 mL IVPB  Status:  Discontinued        2 g 200 mL/hr over 30 Minutes Intravenous Every 8 hours 02/22/21 2121 02/24/21 1036          Subjective: No complaints  Objective: Vitals:   03/04/21 2058 03/04/21 2313 03/05/21 0803 03/05/21 0805  BP:      Pulse: 75 74    Resp: 16 16    Temp:      TempSrc:      SpO2: 99% 98% 94% 94%  Weight:      Height:        Intake/Output Summary (Last 24 hours) at 03/05/2021 1355 Last data filed at 03/05/2021 0410 Gross per 24 hour  Intake --  Output 900 ml  Net -900 ml   Filed Weights   02/26/21 0412 02/27/21 0500 03/04/21 0500  Weight: 106.8 kg 106.6 kg 105.6 kg    Examination:  General: No acute distress. Cardiovascular: RRR Lungs:  unlabored Neurological: Alert and oriented 3. Moves all extremities 4 . Cranial nerves II through XII grossly intact. Skin: Warm and dry. No rashes or lesions. Extremities: No clubbing or cyanosis. No edema.     Data Reviewed: I have personally reviewed following labs and imaging studies  CBC: Recent Labs  Lab 03/03/21 0217 03/04/21 0448 03/05/21 0340  WBC 9.2 7.7  --   HGB 8.1* 7.8* 8.2*  HCT 30.3* 29.1* 30.5*  MCV 78.7* 78.0*  --   PLT 284 278  --     Basic Metabolic Panel: Recent Labs  Lab 02/27/21 0236 03/02/21 1906 03/03/21 0217 03/04/21 0448 03/05/21 0340  NA 135  --  135 137 135  K 4.2  --  3.7 3.8 4.1  CL 91*  --  94* 97* 95*  CO2 35*  --  33* 31 31  GLUCOSE 90  --  98 95 93  BUN 5*  --  15 11 11   CREATININE 0.96  --  1.58* 1.34* 1.43*  CALCIUM 8.9  --  8.8* 8.9 9.2  MG  --  2.5* 2.7*  --   --   PHOS  --   --  4.2  --   --     GFR: Estimated Creatinine Clearance: 55.7 mL/min (Aiya Keach) (by C-G formula based on SCr of 1.43 mg/dL (H)).  Liver Function Tests: No results for input(s): AST, ALT, ALKPHOS, BILITOT, PROT, ALBUMIN in the last 168 hours.  CBG: Recent Labs  Lab 03/03/21 1202 03/03/21 1608  GLUCAP 95 119*     No results found for this or any previous visit (from the past 240 hour(s)).        Radiology Studies: No results found.      Scheduled Meds:  arformoterol  15 mcg Nebulization BID   budesonide (PULMICORT) nebulizer solution  0.5 mg Nebulization BID   Chlorhexidine Gluconate Cloth  6 each Topical QHS   diclofenac Sodium  2 g Topical QID   empagliflozin  10 mg Oral Daily   furosemide  80 mg Oral Daily   pantoprazole  40 mg  Oral BID   sacubitril-valsartan  1 tablet Oral BID   Continuous Infusions:   LOS: 11 days    Time spent: over 30 min    Lacretia Nicks, MD [facility]   To contact the attending provider between 7A-7P or the covering provider during after hours 7P-7A, please log into the web site  [email or web address] and access using universal [place] password for that web site. If you do not have the password, please call the hospital operator.  03/05/2021, 1:55 PM

## 2021-03-06 DIAGNOSIS — J9622 Acute and chronic respiratory failure with hypercapnia: Secondary | ICD-10-CM | POA: Diagnosis not present

## 2021-03-06 LAB — BASIC METABOLIC PANEL
Anion gap: 7 (ref 5–15)
BUN: 10 mg/dL (ref 8–23)
CO2: 31 mmol/L (ref 22–32)
Calcium: 9.2 mg/dL (ref 8.9–10.3)
Chloride: 97 mmol/L — ABNORMAL LOW (ref 98–111)
Creatinine, Ser: 1.29 mg/dL — ABNORMAL HIGH (ref 0.61–1.24)
GFR, Estimated: 60 mL/min — ABNORMAL LOW (ref >60)
Glucose, Bld: 101 mg/dL — ABNORMAL HIGH (ref 70–99)
Potassium: 4 mmol/L (ref 3.5–5.1)
Sodium: 135 mmol/L (ref 135–145)

## 2021-03-06 NOTE — Progress Notes (Signed)
PROGRESS NOTE    [name]  YWV:[id number] DOB: August 12, 1950 DOA: 02/22/2021 PCP: Shirlean Mylar, MD   Chief Complaint  Patient presents with   Shortness of Breath   Brief Narrative:  Kristopher [name] is Kristopher [name] 70 year old male with nonischemic cardiomyopathy and EF of 30 to 35% who has declined an ICD, severe obstructive sleep apnea on CPAP, COPD, iron deficiency anemia, presented to the ED on 9/14 for shortness of breath x3 weeks intermittent but progressive.  EMS found him to have oxygen saturations in the 70s and placed him on 3 L of nasal cannula.  At baseline he is able to walk about 35 minutes but over the past few days he was barely able to walk 10 minutes due to dyspnea on exertion.  He also complained of cough with increased sputum production for the past week.  He is compliant with his CPAP at night however his machine broke [name] few months ago and he has not been able to replace it.   He became somnolent in the ED, requiring bipap.  He improved on bipap.  PCCM was consulted for admission given his need for bipap with hypotension and multiple critical issues.  Creatinine noted to be 2.23 at presentation.   While in the ED he became lethargic.  He was admitted to critical care.  Chest x-ray revealed cardiomegaly with mild interstitial prominence and small effusions.  Hemoglobin noted to be 6.4 with Carver Murakami baseline of around 8.  He declined to have Hemoccult testing in the ED.  EGD was performed on 10/29/20 and did not reveal Arath Kaigler source for bleeding.  Colonoscopy showed hemorrhoids and polyps.  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  Heart failure team was consulted on 9/15.   2D echo revealed an EF of 70 to 75%, hyperdynamic LV function biatrial enlargement mild MR and mild AI.  He continued to require BiPAP for Ambrea Hegler number of days.  Eventually was weaned down to Mahonri Seiden nasal cannula and transferred out of the ICU to the triad hospitalist service.  Of note patient was treated  empirically with antibiotics and transition to ceftriaxone and azithromycin with [name] plan to complete [name] 7-day course.  GI was consulted for heme positive stools and anemia.  He's now s/p EGD notable for non bleeding gastric ulcer without stigmata of bleeding.   Assessment & Plan:   Principal Problem:   Acute on chronic respiratory failure with hypercapnia (HCC) Active Problems:   DCM (dilated cardiomyopathy) (HCC)   PUD (peptic ulcer disease)   OSA (obstructive sleep apnea)   Morbid obesity (HCC)   Symptomatic anemia   COPD (chronic obstructive pulmonary disease) (HCC)   Wenckebach block   AKI (acute kidney injury) (HCC)   IDA (iron deficiency anemia)  AKI - Cr steadily improved to normal - baseline ~ 1.1 - worsened recently, now improving - may need to adjust meds if worsening - follow repeat UA (bland)  Acute on chronic hypoxic and hypercarbic respiratory failure with hypercapnia (HCC) -  Requiring BiPAP - currently on RA - repeat home o2 screen (maintains >88 with activity on RA) - treated for pneumonia - Initially given aztreonam and linezolid while in the ICU due to penicillin allergy-currently on ceftriaxone and azithromycin for plan to complete [name] 7-day course (last day will be 03/01/21)   Obstructive sleep apnea/chronic hypercarbic respiratory failure Obesity hypoventilation - He will need Veniamin Kincaid trilogy ventilator when discharged -Previous hospitalist contacted social work to help arrange this and have  been told that it will likely take Kyeshia Zinn few days for insurance to approve it - unfortunately, he cannot be discharged without it   Chronic systolic CHF - EF 25-30% - EF improved now to 70-75%, no RWMA, LV diastolic parameters indeterminate (see report) - cont Lasix, Empagliflozin and Entresto per cardiology/ heart failure team - continue to hold beta blocker with wenkebach    NSVT - 12 beats this AM - holding beta blocker with wenkebach - K>4, mg>2  Hemoccult positive, Iron  deficiency anemia with acute blood loss - Prior history of H. pylori infection which was untreated - s/p 2 units pRBC -- hb stable follow  -- bx stomach showed chronic gastritis, negative for h pylori - GI recommending BID PPI x4 weeks followed by daily protonix 40 mg daily - EGD reveals [name] gastric ulcer without bleeding and gastritis - have advised him to avoid NSAIDS and alcohol   Wenckebach heart block - intermittent - holding Coreg/beta blocker   Hyperkalemia -improved   Morbid obesity Body mass index is 36.81 kg/m.  Back Pain  Right Shoulder pain Trial of voltaren, sounds msk  Left Ear Lesion Needs dermatology follow up  DVT prophylaxis: SCD Code Status: full  Family Communication: none at bedside Disposition:   Status is: Inpatient  Remains inpatient appropriate because:Inpatient level of care appropriate due to severity of illness  Dispo: The patient is from: Home              Anticipated d/c is to: Home              Patient currently is medically stable to d/c.   Difficult to place patient No       Consultants:  GI PCCM cardiology  Procedures: Echo IMPRESSIONS     1. Left ventricular ejection fraction, by estimation, is 70 to 75%. The  left ventricle has hyperdynamic function. The left ventricle has no  regional wall motion abnormalities. There is mild left ventricular  hypertrophy. Left ventricular diastolic  parameters are indeterminate.   2. Right ventricular systolic function is normal. The right ventricular  size is normal.   3. Left atrial size was moderately dilated.   4. Right atrial size was moderately dilated.   5. The mitral valve is normal in structure. Mild mitral valve  regurgitation.   6. The aortic valve is tricuspid. Aortic valve regurgitation is mild.  Mild aortic valve sclerosis is present, with no evidence of aortic valve  stenosis.   7. The inferior vena cava is dilated in size with >50% respiratory  variability,  suggesting right atrial pressure of 8 mmHg.  Endoscopy Impression - Z-line irregular, 36 cm from the incisors. - Small hiatal hernia. - Non-bleeding gastric ulcer with no stigmata of bleeding. Biopsied. - Gastritis. - Normal duodenal bulb, first portion of the duodenum and second portion of the duodenum. Recommendation - Return patient to hospital ward for ongoing care. - Soft diet. - Continue present medications. - Await pathology results.  Antimicrobials: Anti-infectives (From admission, onward)    Start     Dose/Rate Route Frequency Ordered Stop   02/24/21 1200  cefTRIAXone (ROCEPHIN) 1 g in sodium chloride 0.9 % 100 mL IVPB        1 g 200 mL/hr over 30 Minutes Intravenous Every 24 hours 02/24/21 1036 03/01/21 1438   02/24/21 1200  azithromycin (ZITHROMAX) tablet 500 mg        500 mg Oral Daily 02/24/21 1038 02/26/21 1034   02/24/21 1130  azithromycin (  ZITHROMAX) tablet 500 mg  Status:  Discontinued        500 mg Oral Daily 02/24/21 1036 02/24/21 1038   02/23/21 1000  linezolid (ZYVOX) IVPB 600 mg  Status:  Discontinued        600 mg 300 mL/hr over 60 Minutes Intravenous Every 12 hours 02/23/21 0903 02/24/21 1036   02/22/21 2200  aztreonam (AZACTAM) 2 g in sodium chloride 0.9 % 100 mL IVPB  Status:  Discontinued        2 g 200 mL/hr over 30 Minutes Intravenous Every 8 hours 02/22/21 2121 02/24/21 1036          Subjective: No complaints at this time  Objective: Vitals:   03/05/21 2121 03/06/21 0426 03/06/21 0717 03/06/21 1530  BP: 128/71  120/80 (!) 130/55  Pulse: 83  89 94  Resp: 20  18 17   Temp: 99.2 F (37.3 C)  98.7 F (37.1 C) 98.7 F (37.1 C)  TempSrc: Oral  Oral Oral  SpO2: 97%  97% 98%  Weight:  102.8 kg    Height:        Intake/Output Summary (Last 24 hours) at 03/06/2021 1631 Last data filed at 03/06/2021 1300 Gross per 24 hour  Intake 720 ml  Output 1100 ml  Net -380 ml   Filed Weights   02/27/21 0500 03/04/21 0500 03/06/21 0426  Weight:  106.6 kg 105.6 kg 102.8 kg    Examination:  General: No acute distress. Cardiovascular: RRR Lungs: unlabored Abdomen: Soft, nontender, nondistended Neurological: Alert and oriented 3. Moves all extremities 4. Cranial nerves II through XII grossly intact. Skin: Warm and dry. No rashes or lesions. Extremities: No clubbing or cyanosis. No edema.   Data Reviewed: I have personally reviewed following labs and imaging studies  CBC: Recent Labs  Lab 03/03/21 0217 03/04/21 0448 03/05/21 0340  WBC 9.2 7.7  --   HGB 8.1* 7.8* 8.2*  HCT 30.3* 29.1* 30.5*  MCV 78.7* 78.0*  --   PLT 284 278  --     Basic Metabolic Panel: Recent Labs  Lab 03/02/21 1906 03/03/21 0217 03/04/21 0448 03/05/21 0340 03/06/21 0359  NA  --  135 137 135 135  K  --  3.7 3.8 4.1 4.0  CL  --  94* 97* 95* 97*  CO2  --  33* 31 31 31   GLUCOSE  --  98 95 93 101*  BUN  --  15 11 11 10   CREATININE  --  1.58* 1.34* 1.43* 1.29*  CALCIUM  --  8.8* 8.9 9.2 9.2  MG 2.5* 2.7*  --   --   --   PHOS  --  4.2  --   --   --     GFR: Estimated Creatinine Clearance: 60.9 mL/min (Vani Gunner) (by C-G formula based on SCr of 1.29 mg/dL (H)).  Liver Function Tests: No results for input(s): AST, ALT, ALKPHOS, BILITOT, PROT, ALBUMIN in the last 168 hours.  CBG: Recent Labs  Lab 03/03/21 1202 03/03/21 1608  GLUCAP 95 119*     No results found for this or any previous visit (from the past 240 hour(s)).        Radiology Studies: No results found.      Scheduled Meds:  arformoterol  15 mcg Nebulization BID   budesonide (PULMICORT) nebulizer solution  0.5 mg Nebulization BID   diclofenac Sodium  2 g Topical QID   empagliflozin  10 mg Oral Daily   furosemide  80 mg Oral Daily  pantoprazole  40 mg Oral BID   sacubitril-valsartan  1 tablet Oral BID   Continuous Infusions:   LOS: 12 days    Time spent: over 30 min    Lacretia Nicks, MD [facility]   To contact the attending provider between  7A-7P or the covering provider during after hours 7P-7A, please log into the web site [email or web address] and access using universal [place] password for that web site. If you do not have the password, please call the hospital operator.  03/06/2021, 4:31 PM

## 2021-03-06 NOTE — TOC Progression Note (Signed)
Transition of Care [facility]) - Progression Note    Patient Details  Name: Kristopher [name] MRN: [id number] Date of Birth: 1951/01/16  Transition of [facility]) CM/SW Contact  Epifanio Lesches, RN Phone Number: 03/06/2021, 2:37 PM  Clinical Narrative:    Trilogy approval still pending. Per Ian Malkin with [employer] determination can take up to 14 days, today is day 7.  TOC team will continue to monitor and assist with TOC needs.....  Expected Discharge Plan: Home/Self Care Barriers to Discharge: Other (must enter comment) (awaiting approval for home trilogy)  Expected Discharge Plan and Services Expected Discharge Plan: Home/Self Care         Expected Discharge Date: 02/27/21               DME Arranged: Oxygen, NIV DME Agency: [employer] Date DME Agency Contacted: 02/25/21 Time DME Agency Contacted: [phone] Representative spoke with at DME Agency: Ian Malkin HH Arranged: NA HH Agency: NA         Social Determinants of Health (SDOH) Interventions    Readmission Risk Interventions No flowsheet data found.

## 2021-03-06 NOTE — Progress Notes (Signed)
Pt stated he will place himself on BIPAP without assistance. RT will monitor as needed.

## 2021-03-07 NOTE — Progress Notes (Signed)
Pt self admin CPAP and resting comfortably. RT will cont to monitor.

## 2021-03-07 NOTE — Progress Notes (Signed)
PROGRESS NOTE    Danis Pembleton Val  NLG:[id number] DOB: 1951/01/24 DOA: 02/22/2021 PCP: Shirlean Mylar, MD   Chief Complaint  Patient presents with   Shortness of Breath   Brief Narrative:  FRAZER RAINVILLE is Bellarae Lizer 70 year old male with nonischemic cardiomyopathy and EF of 30 to 35% who has declined an ICD, severe obstructive sleep apnea on CPAP, COPD, iron deficiency anemia, presented to the ED on 9/14 for shortness of breath x3 weeks intermittent but progressive.  EMS found him to have oxygen saturations in the 70s and placed him on 3 L of nasal cannula.  At baseline he is able to walk about 35 minutes but over the past few days he was barely able to walk 10 minutes due to dyspnea on exertion.  He also complained of cough with increased sputum production for the past week.  He is compliant with his CPAP at night however his machine broke Shyler Holzman few months ago and he has not been able to replace it.   He became somnolent in the ED, requiring bipap.  He improved on bipap.  PCCM was consulted for admission given his need for bipap with hypotension and multiple critical issues.  Creatinine noted to be 2.23 at presentation.   While in the ED he became lethargic.  He was admitted to critical care.  Chest x-ray revealed cardiomegaly with mild interstitial prominence and small effusions.  Hemoglobin noted to be 6.4 with Pachia Strum baseline of around 8.  He declined to have Hemoccult testing in the ED.  EGD was performed on 10/29/20 and did not reveal Solace Wendorff source for bleeding.  Colonoscopy showed hemorrhoids and polyps.  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  Heart failure team was consulted on 9/15.   2D echo revealed an EF of 70 to 75%, hyperdynamic LV function biatrial enlargement mild MR and mild AI.  He continued to require BiPAP for Torie Priebe number of days.  Eventually was weaned down to Devra Stare nasal cannula and transferred out of the ICU to the triad hospitalist service.  Of note patient was treated  empirically with antibiotics and transition to ceftriaxone and azithromycin with Harla Mensch plan to complete Jennavie Martinek 7-day course.  GI was consulted for heme positive stools and anemia.  He's now s/p EGD notable for non bleeding gastric ulcer without stigmata of bleeding.   Awaiting trilogy ventilator prior to discharge.  Assessment & Plan:   Principal Problem:   Acute on chronic respiratory failure with hypercapnia (HCC) Active Problems:   DCM (dilated cardiomyopathy) (HCC)   PUD (peptic ulcer disease)   OSA (obstructive sleep apnea)   Morbid obesity (HCC)   Symptomatic anemia   COPD (chronic obstructive pulmonary disease) (HCC)   Wenckebach block   AKI (acute kidney injury) (HCC)   IDA (iron deficiency anemia)  AKI - Cr steadily improved to normal - baseline ~ 1.1 - worsened recently, now improving - may need to adjust meds if worsening - follow repeat UA (bland)  Acute on chronic hypoxic and hypercarbic respiratory failure with hypercapnia (HCC) -  Requiring BiPAP - currently on RA - repeat home o2 screen (maintains >88 with activity on RA) - treated for pneumonia - Initially given aztreonam and linezolid while in the ICU due to penicillin allergy-currently on ceftriaxone and azithromycin for plan to complete Tanessa Tidd 7-day course (last day will be 03/01/21)   Obstructive sleep apnea/chronic hypercarbic respiratory failure Obesity hypoventilation - He will need Ruthanna Macchia trilogy ventilator when discharged -Previous hospitalist contacted social  work to help arrange this and have been told that it will likely take Vendela Troung few days for insurance to approve it - unfortunately, he cannot be discharged without it   Chronic systolic CHF - EF 25-30% - EF improved now to 70-75%, no RWMA, LV diastolic parameters indeterminate (see report) - cont Lasix, Empagliflozin and Entresto per cardiology/ heart failure team - continue to hold beta blocker with wenkebach    NSVT - 12 beats this AM - holding beta blocker with  wenkebach - K>4, mg>2  Hemoccult positive, Iron deficiency anemia with acute blood loss - Prior history of H. pylori infection which was untreated - s/p 2 units pRBC -- hb stable follow  -- bx stomach showed chronic gastritis, negative for h pylori - GI recommending BID PPI x4 weeks followed by daily protonix 40 mg daily - EGD reveals Melisse Caetano gastric ulcer without bleeding and gastritis - have advised him to avoid NSAIDS and alcohol   Wenckebach heart block - intermittent - holding Coreg/beta blocker   Hyperkalemia -improved   Morbid obesity Body mass index is 36.81 kg/m.  Back Pain  Right Shoulder pain Trial of voltaren, sounds msk  Left Ear Lesion Needs dermatology follow up outpatient  DVT prophylaxis: SCD Code Status: full  Family Communication: none at bedside Disposition:   Status is: Inpatient  Remains inpatient appropriate because:Inpatient level of care appropriate due to severity of illness  Dispo: The patient is from: Home              Anticipated d/c is to: Home              Patient currently is medically stable to d/c.   Difficult to place patient No       Consultants:  GI PCCM cardiology  Procedures: Echo IMPRESSIONS     1. Left ventricular ejection fraction, by estimation, is 70 to 75%. The  left ventricle has hyperdynamic function. The left ventricle has no  regional wall motion abnormalities. There is mild left ventricular  hypertrophy. Left ventricular diastolic  parameters are indeterminate.   2. Right ventricular systolic function is normal. The right ventricular  size is normal.   3. Left atrial size was moderately dilated.   4. Right atrial size was moderately dilated.   5. The mitral valve is normal in structure. Mild mitral valve  regurgitation.   6. The aortic valve is tricuspid. Aortic valve regurgitation is mild.  Mild aortic valve sclerosis is present, with no evidence of aortic valve  stenosis.   7. The inferior vena cava  is dilated in size with >50% respiratory  variability, suggesting right atrial pressure of 8 mmHg.  Endoscopy Impression - Z-line irregular, 36 cm from the incisors. - Small hiatal hernia. - Non-bleeding gastric ulcer with no stigmata of bleeding. Biopsied. - Gastritis. - Normal duodenal bulb, first portion of the duodenum and second portion of the duodenum. Recommendation - Return patient to hospital ward for ongoing care. - Soft diet. - Continue present medications. - Await pathology results.  Antimicrobials: Anti-infectives (From admission, onward)    Start     Dose/Rate Route Frequency Ordered Stop   02/24/21 1200  cefTRIAXone (ROCEPHIN) 1 g in sodium chloride 0.9 % 100 mL IVPB        1 g 200 mL/hr over 30 Minutes Intravenous Every 24 hours 02/24/21 1036 03/01/21 1438   02/24/21 1200  azithromycin (ZITHROMAX) tablet 500 mg        500 mg Oral Daily 02/24/21 1038  02/26/21 1034   02/24/21 1130  azithromycin (ZITHROMAX) tablet 500 mg  Status:  Discontinued        500 mg Oral Daily 02/24/21 1036 02/24/21 1038   02/23/21 1000  linezolid (ZYVOX) IVPB 600 mg  Status:  Discontinued        600 mg 300 mL/hr over 60 Minutes Intravenous Every 12 hours 02/23/21 0903 02/24/21 1036   02/22/21 2200  aztreonam (AZACTAM) 2 g in sodium chloride 0.9 % 100 mL IVPB  Status:  Discontinued        2 g 200 mL/hr over 30 Minutes Intravenous Every 8 hours 02/22/21 2121 02/24/21 1036          Subjective: No complaints  Objective: Vitals:   03/06/21 2034 03/06/21 2157 03/07/21 0800 03/07/21 1500  BP:  120/61 125/71 132/84  Pulse: 80 95 (!) 102 78  Resp: 18 18 18 17   Temp:  98.4 F (36.9 C) 98.4 F (36.9 C) 97.8 F (36.6 C)  TempSrc:  Oral Oral Oral  SpO2: 96% 95% 94% 96%  Weight:      Height:        Intake/Output Summary (Last 24 hours) at 03/07/2021 2024 Last data filed at 03/07/2021 1500 Gross per 24 hour  Intake 650 ml  Output --  Net 650 ml   Filed Weights   02/27/21 0500  03/04/21 0500 03/06/21 0426  Weight: 106.6 kg 105.6 kg 102.8 kg    Examination:  General: No acute distress. Lungs: unlabored Neurological: Alert and oriented 3. Moves all extremities 4. Cranial nerves II through XII grossly intact. Skin: Warm and dry. No rashes or lesions. Extremities: no LEE   Data Reviewed: I have personally reviewed following labs and imaging studies  CBC: Recent Labs  Lab 03/03/21 0217 03/04/21 0448 03/05/21 0340  WBC 9.2 7.7  --   HGB 8.1* 7.8* 8.2*  HCT 30.3* 29.1* 30.5*  MCV 78.7* 78.0*  --   PLT 284 278  --     Basic Metabolic Panel: Recent Labs  Lab 03/02/21 1906 03/03/21 0217 03/04/21 0448 03/05/21 0340 03/06/21 0359  NA  --  135 137 135 135  K  --  3.7 3.8 4.1 4.0  CL  --  94* 97* 95* 97*  CO2  --  33* 31 31 31   GLUCOSE  --  98 95 93 101*  BUN  --  15 11 11 10   CREATININE  --  1.58* 1.34* 1.43* 1.29*  CALCIUM  --  8.8* 8.9 9.2 9.2  MG 2.5* 2.7*  --   --   --   PHOS  --  4.2  --   --   --     GFR: Estimated Creatinine Clearance: 60.9 mL/min (Guiselle Mian) (by C-G formula based on SCr of 1.29 mg/dL (H)).  Liver Function Tests: No results for input(s): AST, ALT, ALKPHOS, BILITOT, PROT, ALBUMIN in the last 168 hours.  CBG: Recent Labs  Lab 03/03/21 1202 03/03/21 1608  GLUCAP 95 119*     No results found for this or any previous visit (from the past 240 hour(s)).        Radiology Studies: No results found.      Scheduled Meds:  arformoterol  15 mcg Nebulization BID   budesonide (PULMICORT) nebulizer solution  0.5 mg Nebulization BID   diclofenac Sodium  2 g Topical QID   empagliflozin  10 mg Oral Daily   furosemide  80 mg Oral Daily   pantoprazole  40 mg Oral  BID   sacubitril-valsartan  1 tablet Oral BID   Continuous Infusions:   LOS: 13 days    Time spent: over 30 min    Lacretia Nicks, MD [facility]   To contact the attending provider between 7A-7P or the covering provider during after hours  7P-7A, please log into the web site [email or web address] and access using universal [place] password for that web site. If you do not have the password, please call the hospital operator.  03/07/2021, 8:24 PM

## 2021-03-08 MED ORDER — TRAZODONE HCL 50 MG PO TABS: 50.0000 mg | ORAL_TABLET | Freq: Every evening | ORAL | Status: DC | PRN

## 2021-03-08 MED ORDER — IPRATROPIUM-ALBUTEROL 0.5-2.5 (3) MG/3ML IN SOLN
3.0000 mL | RESPIRATORY_TRACT | Status: DC | PRN
  Administered 2021-03-11: 3 mL via RESPIRATORY_TRACT

## 2021-03-08 MED ORDER — METOPROLOL TARTRATE 5 MG/5ML IV SOLN: 5.0000 mg | INTRAVENOUS | Status: DC | PRN

## 2021-03-08 MED ORDER — HYDRALAZINE HCL 20 MG/ML IJ SOLN: 10.0000 mg | INTRAMUSCULAR | Status: DC | PRN

## 2021-03-08 NOTE — TOC Progression Note (Signed)
Transition of Care [facility]) - Progression Note    Patient Details  Name: Kristopher [name] MRN: [id number] Date of Birth: Feb 11, 1951  Transition [facility]) CM/SW Contact  Epifanio Lesches, RN Phone Number: 03/08/2021, 1:40 PM  Clinical Narrative:    NCM f/u with [name]/[employer] [id number]) regarding status obtaining approval for Trilogy. Per [name] their team has f/u  and determination is still pending....  TOC team will continue to monitor and assist with needs.   Expected Discharge Plan: Home/Self Care Barriers to Discharge: Other (must enter comment) (approval for triology pending)  Expected Discharge Plan and Services Expected Discharge Plan: Home/Self Care         Expected Discharge Date: 02/27/21               DME Arranged: Oxygen, NIV DME Agency: [employer] Date DME Agency Contacted: 02/25/21 Time DME Agency Contacted: [phone] Representative spoke with at DME Agency: Ian Malkin HH Arranged: NA HH Agency: NA         Social Determinants of Health (SDOH) Interventions    Readmission Risk Interventions No flowsheet data found.

## 2021-03-08 NOTE — Progress Notes (Signed)
Patient stated he didn't need assistance with CPAP tonight, Could place himself on/off as needed. Informed him if he changed his mind to give [place] a call. CPAP at bedside within patients reach. (Auto-titrate max 20, min 6)

## 2021-03-08 NOTE — Progress Notes (Signed)
PROGRESS NOTE    Kristopher [name]  WIO:[id number] DOB: [birth year] DOA: 02/22/2021 PCP: Shirlean Mylar, MD   Brief Narrative:  70 year old male with nonischemic cardiomyopathy and EF of 30 to 35% who has declined an ICD, severe obstructive sleep apnea on CPAP, COPD, iron deficiency anemia, presented to the ED on 9/14 for shortness of breath x3 weeks intermittent but progressive.  EMS found him to have oxygen saturations in the 70s and placed him on 3 L of nasal cannula.  Patient was somnolent in the ED requiring BiPAP.  He was admitted to the ICU due to multiple medical issues and hypotension.  Chest x-ray showed evidence of volume overload requiring some diuretics.  Repeat echocardiogram showed EF of 70% with bilateral atrial enlargement.  He completed 7-day course treatment for IV Rocephin and azithromycin.  Hospital course complicated by heme positive stools therefore underwent EGD showing nonbleeding gastric ulcer without bleeding stigmata.  It was determined that patient will be requiring trilogy at home therefore arrangements being made.   Assessment & Plan:   Principal Problem:   Acute on chronic respiratory failure with hypercapnia (HCC) Active Problems:   DCM (dilated cardiomyopathy) (HCC)   PUD (peptic ulcer disease)   OSA (obstructive sleep apnea)   Morbid obesity (HCC)   Symptomatic anemia   COPD (chronic obstructive pulmonary disease) (HCC)   Wenckebach block   AKI (acute kidney injury) (HCC)   IDA (iron deficiency anemia)  AKI -Admission creatinine 2.23.  Baseline 1.0.  This is resolved.  Creatinine now 1.29   Acute on chronic hypoxic and hypercarbic respiratory failure with hypercapnia (HCC) Community-acquired pneumonia -Now patient is on room air.  Treated with 7 days of antibiotics initially aztreonam/linezolid therefore transition to Rocephin and azithromycin. -He has been weaned off BiPAP   Obstructive sleep apnea/chronic hypercarbic respiratory failure Obesity  hypoventilation -Broken equipment at home, he will require trilogy or BiPAP at home.  Ongoing arrangements by [facility] team   Congestive heart failure with preserved ejection fraction, 70% -His previous EF was 30% now has recovered to 70%.  RV appears okay.  He has been seen by CHF team recommending Lasix, Jardiance and Entresto.   NSVT -Monitor and replete electrolytes as needed   Hemoccult positive, Iron deficiency anemia with acute blood loss -Status post EGD showing chronic gastritis.  PPI twice daily for 4 weeks followed by daily.  Status post 2 units PRBC.  Follow-up outpatient GI in 6 weeks   Wenckebach heart block -Holding beta-blockers   Morbid obesity Body mass index is 36.81 kg/m.   Back Pain  Right Shoulder pain Trial of voltaren, sounds msk   Left Ear Lesion Needs dermatology follow up outpatient       DVT prophylaxis: SCDs Start: 02/22/21 2048  Code Status: Full code Family Communication:    Status is: Inpatient  Remains inpatient appropriate because:Unsafe d/c plan  Dispo: The patient is from: Home              Anticipated d/c is to: Home              Patient currently is medically stable to d/c.  Awaiting trilogy/BiPAP arrangements at home   Difficult to place patient No    Body mass index is 35.25 kg/m.  Pressure Injury 02/25/21 Nose Medial;Upper Stage 2 -  Partial thickness loss of dermis presenting as a shallow open injury with a red, pink wound bed without slough. pressure injury from BiPAP (Active)  02/25/21 1417  Location: Nose  Location Orientation: Medial;Upper  Staging: Stage 2 -  Partial thickness loss of dermis presenting as a shallow open injury with a red, pink wound bed without slough.  Wound Description (Comments): pressure injury from BiPAP  Present on Admission: No    Subjective: No complaints, walking around in the room  Review of Systems Otherwise negative except as per HPI, including: General: Denies fever, chills, night  sweats or unintended weight loss. Resp: Denies cough, wheezing, shortness of breath. Cardiac: Denies chest pain, palpitations, orthopnea, paroxysmal nocturnal dyspnea. GI: Denies abdominal pain, nausea, vomiting, diarrhea or constipation GU: Denies dysuria, frequency, hesitancy or incontinence MS: Denies muscle aches, joint pain or swelling Neuro: Denies headache, neurologic deficits (focal weakness, numbness, tingling), abnormal gait Psych: Denies anxiety, depression, SI/HI/AVH Skin: Denies new rashes or lesions ID: Denies sick contacts, exotic exposures, travel  Examination:  General exam: Appears calm and comfortable  Respiratory system: Clear to auscultation. Respiratory effort normal. Cardiovascular system: S1 & S2 heard, RRR. No JVD, murmurs, rubs, gallops or clicks. No pedal edema. Gastrointestinal system: Abdomen is nondistended, soft and nontender. No organomegaly or masses felt. Normal bowel sounds heard. Central nervous system: Alert and oriented. No focal neurological deficits. Extremities: Symmetric 5 x 5 power. Skin: No rashes, lesions or ulcers Psychiatry: Judgement and insight appear normal. Mood & affect appropriate.     Objective: Vitals:   03/07/21 2310 03/08/21 0500 03/08/21 0728 03/08/21 0852  BP:   115/62   Pulse:   95   Resp:   16   Temp:   98.5 F (36.9 C)   TempSrc:   Oral   SpO2: 96%  97% 95%  Weight:  102.1 kg    Height:        Intake/Output Summary (Last 24 hours) at 03/08/2021 1009 Last data filed at 03/08/2021 0728 Gross per 24 hour  Intake 780 ml  Output 300 ml  Net 480 ml   Filed Weights   03/04/21 0500 03/06/21 0426 03/08/21 0500  Weight: 105.6 kg 102.8 kg 102.1 kg     Data Reviewed:   CBC: Recent Labs  Lab 03/03/21 0217 03/04/21 0448 03/05/21 0340  WBC 9.2 7.7  --   HGB 8.1* 7.8* 8.2*  HCT 30.3* 29.1* 30.5*  MCV 78.7* 78.0*  --   PLT 284 278  --    Basic Metabolic Panel: Recent Labs  Lab 03/02/21 1906 03/03/21 0217  03/04/21 0448 03/05/21 0340 03/06/21 0359  NA  --  135 137 135 135  K  --  3.7 3.8 4.1 4.0  CL  --  94* 97* 95* 97*  CO2  --  33* 31 31 31   GLUCOSE  --  98 95 93 101*  BUN  --  15 11 11 10   CREATININE  --  1.58* 1.34* 1.43* 1.29*  CALCIUM  --  8.8* 8.9 9.2 9.2  MG 2.5* 2.7*  --   --   --   PHOS  --  4.2  --   --   --    GFR: Estimated Creatinine Clearance: 60.7 mL/min (A) (by C-G formula based on SCr of 1.29 mg/dL (H)). Liver Function Tests: No results for input(s): AST, ALT, ALKPHOS, BILITOT, PROT, ALBUMIN in the last 168 hours. No results for input(s): LIPASE, AMYLASE in the last 168 hours. No results for input(s): AMMONIA in the last 168 hours. Coagulation Profile: No results for input(s): INR, PROTIME in the last 168 hours. Cardiac Enzymes: No results for input(s): CKTOTAL, CKMB, CKMBINDEX, TROPONINI in  the last 168 hours. BNP (last 3 results) No results for input(s): PROBNP in the last 8760 hours. HbA1C: No results for input(s): HGBA1C in the last 72 hours. CBG: Recent Labs  Lab 03/03/21 1202 03/03/21 1608  GLUCAP 95 119*   Lipid Profile: No results for input(s): CHOL, HDL, LDLCALC, TRIG, CHOLHDL, LDLDIRECT in the last 72 hours. Thyroid Function Tests: No results for input(s): TSH, T4TOTAL, FREET4, T3FREE, THYROIDAB in the last 72 hours. Anemia Panel: No results for input(s): VITAMINB12, FOLATE, FERRITIN, TIBC, IRON, RETICCTPCT in the last 72 hours. Sepsis Labs: No results for input(s): PROCALCITON, LATICACIDVEN in the last 168 hours.  No results found for this or any previous visit (from the past 240 hour(s)).       Radiology Studies: No results found.      Scheduled Meds:  arformoterol  15 mcg Nebulization BID   budesonide (PULMICORT) nebulizer solution  0.5 mg Nebulization BID   diclofenac Sodium  2 g Topical QID   empagliflozin  10 mg Oral Daily   furosemide  80 mg Oral Daily   pantoprazole  40 mg Oral BID   sacubitril-valsartan  1 tablet Oral  BID   Continuous Infusions:   LOS: 14 days   Time spent= 35 mins    Kristopher Riehle Joline Maxcy, MD [facility]  If 7PM-7AM, please contact night-coverage  03/08/2021, 10:09 AM

## 2021-03-08 NOTE — Plan of Care (Signed)
  Problem: Clinical Measurements: Goal: Ability to maintain clinical measurements within normal limits will improve Outcome: Progressing Goal: Will remain free from infection Outcome: Progressing Goal: Diagnostic test results will improve Outcome: Progressing Goal: Respiratory complications will improve Outcome: Progressing Goal: Cardiovascular complication will be avoided Outcome: Progressing   Problem: Education: Goal: Knowledge of General Education information will improve Description: Including pain rating scale, medication(s)/side effects and non-pharmacologic comfort measures Outcome: Progressing   Problem: Health Behavior/Discharge Planning: Goal: Ability to manage health-related needs will improve Outcome: Progressing   Problem: Activity: Goal: Risk for activity intolerance will decrease Outcome: Progressing   Problem: Nutrition: Goal: Adequate nutrition will be maintained Outcome: Progressing   Problem: Coping: Goal: Level of anxiety will decrease Outcome: Progressing   Problem: Elimination: Goal: Will not experience complications related to bowel motility Outcome: Progressing Goal: Will not experience complications related to urinary retention Outcome: Progressing   Problem: Pain Managment: Goal: General experience of comfort will improve Outcome: Progressing   Problem: Safety: Goal: Ability to remain free from injury will improve Outcome: Progressing   Problem: Skin Integrity: Goal: Risk for impaired skin integrity will decrease Outcome: Progressing   

## 2021-03-09 LAB — BASIC METABOLIC PANEL
Anion gap: 11 (ref 5–15)
BUN: 12 mg/dL (ref 8–23)
CO2: 25 mmol/L (ref 22–32)
Calcium: 8.9 mg/dL (ref 8.9–10.3)
Chloride: 97 mmol/L — ABNORMAL LOW (ref 98–111)
Creatinine, Ser: 1.48 mg/dL — ABNORMAL HIGH (ref 0.61–1.24)
GFR, Estimated: 51 mL/min — ABNORMAL LOW (ref >60)
Glucose, Bld: 77 mg/dL (ref 70–99)
Potassium: 4 mmol/L (ref 3.5–5.1)
Sodium: 133 mmol/L — ABNORMAL LOW (ref 135–145)

## 2021-03-09 LAB — MAGNESIUM: Magnesium: 2.4 mg/dL (ref 1.7–2.4)

## 2021-03-09 LAB — CBC
HCT: 29.3 % — ABNORMAL LOW (ref 39.0–52.0)
Hemoglobin: 8 g/dL — ABNORMAL LOW (ref 13.0–17.0)
MCH: 21.1 pg — ABNORMAL LOW (ref 26.0–34.0)
MCHC: 27.3 g/dL — ABNORMAL LOW (ref 30.0–36.0)
MCV: 77.1 fL — ABNORMAL LOW (ref 80.0–100.0)
Platelets: 392 10*3/uL (ref 150–400)
RBC: 3.8 MIL/uL — ABNORMAL LOW (ref 4.22–5.81)
RDW: 19.9 % — ABNORMAL HIGH (ref 11.5–15.5)
WBC: 9.4 10*3/uL (ref 4.0–10.5)
nRBC: 0 % (ref 0.0–0.2)

## 2021-03-09 LAB — IRON AND TIBC
Iron: 15 ug/dL — ABNORMAL LOW (ref 45–182)
Saturation Ratios: 3 % — ABNORMAL LOW (ref 17.9–39.5)
TIBC: 455 ug/dL — ABNORMAL HIGH (ref 250–450)
UIBC: 440 ug/dL

## 2021-03-09 LAB — FERRITIN: Ferritin: 10 ng/mL — ABNORMAL LOW (ref 24–336)

## 2021-03-09 MED ORDER — FERROUS SULFATE 325 (65 FE) MG PO TABS: 325.0000 mg | ORAL_TABLET | Freq: Every day | ORAL | Status: DC

## 2021-03-09 MED ORDER — SENNOSIDES-DOCUSATE SODIUM 8.6-50 MG PO TABS
1.0000 | ORAL_TABLET | Freq: Every day | ORAL | Status: DC
  Administered 2021-03-09: 1 via ORAL
  Filled 2021-03-09 (×2): qty 1

## 2021-03-09 MED ORDER — SODIUM CHLORIDE 0.9 % IV SOLN
250.0000 mg | Freq: Every day | INTRAVENOUS | Status: DC
  Administered 2021-03-09 – 2021-03-10 (×2): 250 mg via INTRAVENOUS
  Filled 2021-03-09 (×3): qty 20

## 2021-03-09 NOTE — Progress Notes (Signed)
PROGRESS NOTE    Kristopher [name]  HYQ:[id number] DOB: 09/15/1950 DOA: 02/22/2021 PCP: Shirlean Mylar, MD   Brief Narrative:  70 year old male with nonischemic cardiomyopathy and EF of 30 to 35% who has declined an ICD, severe obstructive sleep apnea on CPAP, COPD, iron deficiency anemia, presented to the ED on 9/14 for shortness of breath x3 weeks intermittent but progressive.  EMS found him to have oxygen saturations in the 70s and placed him on 3 L of nasal cannula.  Patient was somnolent in the ED requiring BiPAP.  He was admitted to the ICU due to multiple medical issues and hypotension.  Chest x-ray showed evidence of volume overload requiring some diuretics.  Repeat echocardiogram showed EF of 70% with bilateral atrial enlargement.  He completed 7-day course treatment for IV Rocephin and azithromycin.  Hospital course complicated by heme positive stools therefore underwent EGD showing nonbleeding gastric ulcer without bleeding stigmata.  It was determined that patient will be requiring trilogy at home therefore arrangements being made.   Assessment & Plan:   Principal Problem:   Acute on chronic respiratory failure with hypercapnia (HCC) Active Problems:   DCM (dilated cardiomyopathy) (HCC)   PUD (peptic ulcer disease)   OSA (obstructive sleep apnea)   Morbid obesity (HCC)   Symptomatic anemia   COPD (chronic obstructive pulmonary disease) (HCC)   Wenckebach block   AKI (acute kidney injury) (HCC)   IDA (iron deficiency anemia)  AKI -Admission creatinine 2.23.  Baseline 1.0.  This is resolved.  Creatinine 1.29 > 1.48   Acute on chronic hypoxic and hypercarbic respiratory failure with hypercapnia (HCC) Community-acquired pneumonia -Now patient is on room air.  Treated with 7 days of antibiotics initially aztreonam/linezolid therefore transition to Rocephin and azithromycin. -He has been weaned off BiPAP  Severe microcytic iron deficiency anemia - IV iron X4 doses followed by p.o.   Bowel regimen.   Obstructive sleep apnea/chronic hypercarbic respiratory failure Obesity hypoventilation -Broken equipment at home, he will require trilogy or BiPAP at home.  Ongoing arrangements by [facility] team   Congestive heart failure with preserved ejection fraction, 70% -His previous EF was 30% now has recovered to 70%.  RV appears okay.  He has been seen by CHF team recommending Lasix, Jardiance and Entresto.   NSVT -Monitor and replete electrolytes as needed   Hemoccult positive, Iron deficiency anemia with acute blood loss -Status post EGD showing chronic gastritis.  PPI twice daily for 4 weeks followed by daily.  Status post 2 units PRBC.  Follow-up outpatient GI in 6 weeks   Wenckebach heart block -Holding beta-blockers   Morbid obesity Body mass index is 36.81 kg/m.   Back Pain  Right Shoulder pain Trial of voltaren, sounds msk   Left Ear Lesion Needs dermatology follow up outpatient    DVT prophylaxis: SCDs Start: 02/22/21 2048  Code Status: Full code Family Communication:    Status is: Inpatient  Remains inpatient appropriate because:Unsafe d/c plan  Dispo: The patient is from: Home              Anticipated d/c is to: Home              Patient currently is medically stable to d/c.  Still awaiting trilogy/BiPAP at home   Difficult to place patient No    Body mass index is 35.25 kg/m.  Pressure Injury 02/25/21 Nose Medial;Upper Stage 2 -  Partial thickness loss of dermis presenting as a shallow open injury with a red, pink wound  bed without slough. pressure injury from BiPAP (Active)  02/25/21 1417  Location: Nose  Location Orientation: Medial;Upper  Staging: Stage 2 -  Partial thickness loss of dermis presenting as a shallow open injury with a red, pink wound bed without slough.  Wound Description (Comments): pressure injury from BiPAP  Present on Admission: No    Subjective: Sitting up eating his breakfast, does not have any complaints  Review of  Systems Otherwise negative except as per HPI, including: General: Denies fever, chills, night sweats or unintended weight loss. Resp: Denies cough, wheezing, shortness of breath. Cardiac: Denies chest pain, palpitations, orthopnea, paroxysmal nocturnal dyspnea. GI: Denies abdominal pain, nausea, vomiting, diarrhea or constipation GU: Denies dysuria, frequency, hesitancy or incontinence MS: Denies muscle aches, joint pain or swelling Neuro: Denies headache, neurologic deficits (focal weakness, numbness, tingling), abnormal gait Psych: Denies anxiety, depression, SI/HI/AVH Skin: Denies new rashes or lesions ID: Denies sick contacts, exotic exposures, travel  Examination:  Constitutional: Not in acute distress Respiratory: Clear to auscultation bilaterally Cardiovascular: Normal sinus rhythm, no rubs Abdomen: Nontender nondistended good bowel sounds Musculoskeletal: No edema noted Skin: No rashes seen Neurologic: CN 2-12 grossly intact.  And nonfocal Psychiatric: Normal judgment and insight. Alert and oriented x 3. Normal mood.   Objective: Vitals:   03/08/21 1958 03/08/21 2041 03/09/21 0750 03/09/21 0824  BP: (!) 106/55  119/73   Pulse: 92  100   Resp: 18  17   Temp: 98.2 F (36.8 C)  98.5 F (36.9 C)   TempSrc: Oral  Oral   SpO2: 98% 96% 95% 92%  Weight:      Height:        Intake/Output Summary (Last 24 hours) at 03/09/2021 1037 Last data filed at 03/08/2021 1500 Gross per 24 hour  Intake 1060 ml  Output --  Net 1060 ml   Filed Weights   03/04/21 0500 03/06/21 0426 03/08/21 0500  Weight: 105.6 kg 102.8 kg 102.1 kg     Data Reviewed:   CBC: Recent Labs  Lab 03/03/21 0217 03/04/21 0448 03/05/21 0340 03/09/21 0032  WBC 9.2 7.7  --  9.4  HGB 8.1* 7.8* 8.2* 8.0*  HCT 30.3* 29.1* 30.5* 29.3*  MCV 78.7* 78.0*  --  77.1*  PLT 284 278  --  392   Basic Metabolic Panel: Recent Labs  Lab 03/02/21 1906 03/03/21 0217 03/04/21 0448 03/05/21 0340 03/06/21 0359  03/09/21 0032  NA  --  135 137 135 135 133*  K  --  3.7 3.8 4.1 4.0 4.0  CL  --  94* 97* 95* 97* 97*  CO2  --  33* 31 31 31 25   GLUCOSE  --  98 95 93 101* 77  BUN  --  15 11 11 10 12   CREATININE  --  1.58* 1.34* 1.43* 1.29* 1.48*  CALCIUM  --  8.8* 8.9 9.2 9.2 8.9  MG 2.5* 2.7*  --   --   --  2.4  PHOS  --  4.2  --   --   --   --    GFR: Estimated Creatinine Clearance: 52.9 mL/min (A) (by C-G formula based on SCr of 1.48 mg/dL (H)). Liver Function Tests: No results for input(s): AST, ALT, ALKPHOS, BILITOT, PROT, ALBUMIN in the last 168 hours. No results for input(s): LIPASE, AMYLASE in the last 168 hours. No results for input(s): AMMONIA in the last 168 hours. Coagulation Profile: No results for input(s): INR, PROTIME in the last 168 hours. Cardiac Enzymes: No  results for input(s): CKTOTAL, CKMB, CKMBINDEX, TROPONINI in the last 168 hours. BNP (last 3 results) No results for input(s): PROBNP in the last 8760 hours. HbA1C: No results for input(s): HGBA1C in the last 72 hours. CBG: Recent Labs  Lab 03/03/21 1202 03/03/21 1608  GLUCAP 95 119*   Lipid Profile: No results for input(s): CHOL, HDL, LDLCALC, TRIG, CHOLHDL, LDLDIRECT in the last 72 hours. Thyroid Function Tests: No results for input(s): TSH, T4TOTAL, FREET4, T3FREE, THYROIDAB in the last 72 hours. Anemia Panel: Recent Labs    03/09/21 0822  FERRITIN 10*  TIBC 455*  IRON 15*   Sepsis Labs: No results for input(s): PROCALCITON, LATICACIDVEN in the last 168 hours.  No results found for this or any previous visit (from the past 240 hour(s)).       Radiology Studies: No results found.      Scheduled Meds:  arformoterol  15 mcg Nebulization BID   budesonide (PULMICORT) nebulizer solution  0.5 mg Nebulization BID   diclofenac Sodium  2 g Topical QID   empagliflozin  10 mg Oral Daily   [START ON 03/13/2021] ferrous sulfate  325 mg Oral Q breakfast   furosemide  80 mg Oral Daily   pantoprazole  40 mg  Oral BID   sacubitril-valsartan  1 tablet Oral BID   senna-docusate  1 tablet Oral QHS   Continuous Infusions:  ferric gluconate (FERRLECIT) IVPB       LOS: 15 days   Time spent= 35 mins    Marquel Spoto Joline Maxcy, MD [facility]  If 7PM-7AM, please contact night-coverage  03/09/2021, 10:37 AM

## 2021-03-09 NOTE — Plan of Care (Signed)
  Problem: Clinical Measurements: Goal: Ability to maintain clinical measurements within normal limits will improve Outcome: Progressing Goal: Will remain free from infection Outcome: Progressing Goal: Diagnostic test results will improve Outcome: Progressing Goal: Respiratory complications will improve Outcome: Progressing Goal: Cardiovascular complication will be avoided Outcome: Progressing   Problem: Education: Goal: Knowledge of General Education information will improve Description: Including pain rating scale, medication(s)/side effects and non-pharmacologic comfort measures Outcome: Progressing   Problem: Health Behavior/Discharge Planning: Goal: Ability to manage health-related needs will improve Outcome: Progressing   Problem: Activity: Goal: Risk for activity intolerance will decrease Outcome: Progressing   Problem: Nutrition: Goal: Adequate nutrition will be maintained Outcome: Progressing   Problem: Coping: Goal: Level of anxiety will decrease Outcome: Progressing   Problem: Elimination: Goal: Will not experience complications related to bowel motility Outcome: Progressing Goal: Will not experience complications related to urinary retention Outcome: Progressing   Problem: Pain Managment: Goal: General experience of comfort will improve Outcome: Progressing   Problem: Safety: Goal: Ability to remain free from injury will improve Outcome: Progressing   Problem: Skin Integrity: Goal: Risk for impaired skin integrity will decrease Outcome: Progressing   

## 2021-03-09 NOTE — Progress Notes (Signed)
Pt stated that he does not need assistance placing cpap on for tonight. Pt can place himself on cpap when he is ready for bed.

## 2021-03-10 ENCOUNTER — Telehealth: Payer: Self-pay | Admitting: Internal Medicine

## 2021-03-10 NOTE — Telephone Encounter (Signed)
Do you want this order signed under your name? He has not seen CY since 2020 and he is has already left clinic today. Thanks :)

## 2021-03-10 NOTE — Telephone Encounter (Signed)
Anders Simmonds, NP called in & requested that we order a split night sleep study for this patient who is currently hospitalized.  Pt will be released with a bi-pap from [facility] & will need the split night study within 30 days.  Cindee Lame states he is not abel to place the order so we can see it.  Please place a split night study for pt.

## 2021-03-10 NOTE — Progress Notes (Signed)
PROGRESS NOTE    Kristopher [name]  EHM:[id number] DOB: May 19, 1951 DOA: 02/22/2021 PCP: Shirlean Mylar, MD   Brief Narrative:  70 year old male with nonischemic cardiomyopathy and EF of 30 to 35% who has declined an ICD, severe obstructive sleep apnea on CPAP, COPD, iron deficiency anemia, presented to the ED on 9/14 for shortness of breath x3 weeks intermittent but progressive.  EMS found him to have oxygen saturations in the 70s and placed him on 3 L of nasal cannula.  Patient was somnolent in the ED requiring BiPAP.  He was admitted to the ICU due to multiple medical issues and hypotension.  Chest x-ray showed evidence of volume overload requiring some diuretics.  Repeat echocardiogram showed EF of 70% with bilateral atrial enlargement.  He completed 7-day course treatment for IV Rocephin and azithromycin.  Hospital course complicated by heme positive stools therefore underwent EGD showing nonbleeding gastric ulcer without bleeding stigmata.  It was determined that patient will be requiring trilogy at home therefore arrangements being made.   Assessment & Plan:   Principal Problem:   Acute on chronic respiratory failure with hypercapnia (HCC) Active Problems:   DCM (dilated cardiomyopathy) (HCC)   PUD (peptic ulcer disease)   OSA (obstructive sleep apnea)   Morbid obesity (HCC)   Symptomatic anemia   COPD (chronic obstructive pulmonary disease) (HCC)   Wenckebach block   AKI (acute kidney injury) (HCC)   IDA (iron deficiency anemia)  AKI -Admission creatinine 2.23.  Baseline 1.0.  This is resolved.  Creatinine 1.29 > 1.48   Acute on chronic hypoxic and hypercarbic respiratory failure with hypercapnia (HCC) Community-acquired pneumonia - Remains on room air treated with 7 days of antibiotics initially aztreonam/linezolid therefore transition to Rocephin and azithromycin. -He has been weaned off BiPAP  Severe microcytic iron deficiency anemia - IV iron X4 doses followed by p.o.  Bowel  regimen.   Obstructive sleep apnea/chronic hypercarbic respiratory failure Obesity hypoventilation -Broken equipment at home, he will require trilogy or BiPAP at home.  Ongoing arrangements by [facility] team   Congestive heart failure with preserved ejection fraction, 70% -His previous EF was 30% now has recovered to 70%.  RV appears okay.  He has been seen by CHF team recommending Lasix, Jardiance and Entresto.   NSVT -Monitor and replete electrolytes as needed   Hemoccult positive, Iron deficiency anemia with acute blood loss -Status post EGD showing chronic gastritis.  PPI twice daily for 4 weeks followed by daily.  Status post 2 units PRBC.  Follow-up outpatient GI in 6 weeks   Wenckebach heart block -Holding beta-blockers   Morbid obesity Body mass index is 36.81 kg/m.   Back Pain  Right Shoulder pain Trial of voltaren, sounds msk   Left Ear Lesion Needs dermatology follow up outpatient    DVT prophylaxis: SCDs Start: 02/22/21 2048  Code Status: Full code Family Communication:    Status is: Inpatient  Remains inpatient appropriate because:Unsafe d/c plan  Dispo: The patient is from: Home              Anticipated d/c is to: Home              Patient currently is medically stable to d/c.  Still awaiting trilogy/BiPAP at home   Difficult to place patient No    Body mass index is 35.25 kg/m.  Pressure Injury 02/25/21 Nose Medial;Upper Stage 2 -  Partial thickness loss of dermis presenting as a shallow open injury with a red, pink wound bed without  slough. pressure injury from BiPAP (Active)  02/25/21 1417  Location: Nose  Location Orientation: Medial;Upper  Staging: Stage 2 -  Partial thickness loss of dermis presenting as a shallow open injury with a red, pink wound bed without slough.  Wound Description (Comments): pressure injury from BiPAP  Present on Admission: No    Subjective: Sitting up in the bed, no complaints  Review of Systems Otherwise negative  except as per HPI, including: General: Denies fever, chills, night sweats or unintended weight loss. Resp: Denies cough, wheezing, shortness of breath. Cardiac: Denies chest pain, palpitations, orthopnea, paroxysmal nocturnal dyspnea. GI: Denies abdominal pain, nausea, vomiting, diarrhea or constipation GU: Denies dysuria, frequency, hesitancy or incontinence MS: Denies muscle aches, joint pain or swelling Neuro: Denies headache, neurologic deficits (focal weakness, numbness, tingling), abnormal gait Psych: Denies anxiety, depression, SI/HI/AVH Skin: Denies new rashes or lesions ID: Denies sick contacts, exotic exposures, travel  Examination:  Constitutional: Not in acute distress Respiratory: Clear to auscultation bilaterally Cardiovascular: Normal sinus rhythm, no rubs Abdomen: Nontender nondistended good bowel sounds Musculoskeletal: No edema noted Skin: No rashes seen Neurologic: CN 2-12 grossly intact.  And nonfocal Psychiatric: Normal judgment and insight. Alert and oriented x 3. Normal mood. Objective: Vitals:   03/09/21 1929 03/09/21 2049 03/10/21 0821 03/10/21 0852  BP: 109/64  (!) 134/49   Pulse: (!) 106  95   Resp: 18  16   Temp: 98.1 F (36.7 C)  98.4 F (36.9 C)   TempSrc: Oral  Oral   SpO2: 97% 92% 99% 98%  Weight:      Height:        Intake/Output Summary (Last 24 hours) at 03/10/2021 1136 Last data filed at 03/09/2021 1629 Gross per 24 hour  Intake 270.62 ml  Output --  Net 270.62 ml   Filed Weights   03/04/21 0500 03/06/21 0426 03/08/21 0500  Weight: 105.6 kg 102.8 kg 102.1 kg     Data Reviewed:   CBC: Recent Labs  Lab 03/04/21 0448 03/05/21 0340 03/09/21 0032  WBC 7.7  --  9.4  HGB 7.8* 8.2* 8.0*  HCT 29.1* 30.5* 29.3*  MCV 78.0*  --  77.1*  PLT 278  --  392   Basic Metabolic Panel: Recent Labs  Lab 03/04/21 0448 03/05/21 0340 03/06/21 0359 03/09/21 0032  NA 137 135 135 133*  K 3.8 4.1 4.0 4.0  CL 97* 95* 97* 97*  CO2 31 31 31  25   GLUCOSE 95 93 101* 77  BUN 11 11 10 12   CREATININE 1.34* 1.43* 1.29* 1.48*  CALCIUM 8.9 9.2 9.2 8.9  MG  --   --   --  2.4   GFR: Estimated Creatinine Clearance: 52.9 mL/min (A) (by C-G formula based on SCr of 1.48 mg/dL (H)). Liver Function Tests: No results for input(s): AST, ALT, ALKPHOS, BILITOT, PROT, ALBUMIN in the last 168 hours. No results for input(s): LIPASE, AMYLASE in the last 168 hours. No results for input(s): AMMONIA in the last 168 hours. Coagulation Profile: No results for input(s): INR, PROTIME in the last 168 hours. Cardiac Enzymes: No results for input(s): CKTOTAL, CKMB, CKMBINDEX, TROPONINI in the last 168 hours. BNP (last 3 results) No results for input(s): PROBNP in the last 8760 hours. HbA1C: No results for input(s): HGBA1C in the last 72 hours. CBG: Recent Labs  Lab 03/03/21 1202 03/03/21 1608  GLUCAP 95 119*   Lipid Profile: No results for input(s): CHOL, HDL, LDLCALC, TRIG, CHOLHDL, LDLDIRECT in the last 72 hours.  Thyroid Function Tests: No results for input(s): TSH, T4TOTAL, FREET4, T3FREE, THYROIDAB in the last 72 hours. Anemia Panel: Recent Labs    03/09/21 0822  FERRITIN 10*  TIBC 455*  IRON 15*   Sepsis Labs: No results for input(s): PROCALCITON, LATICACIDVEN in the last 168 hours.  No results found for this or any previous visit (from the past 240 hour(s)).       Radiology Studies: No results found.      Scheduled Meds:  arformoterol  15 mcg Nebulization BID   budesonide (PULMICORT) nebulizer solution  0.5 mg Nebulization BID   diclofenac Sodium  2 g Topical QID   empagliflozin  10 mg Oral Daily   [START ON 03/13/2021] ferrous sulfate  325 mg Oral Q breakfast   furosemide  80 mg Oral Daily   pantoprazole  40 mg Oral BID   sacubitril-valsartan  1 tablet Oral BID   senna-docusate  1 tablet Oral QHS   Continuous Infusions:  ferric gluconate (FERRLECIT) IVPB 250 mg (03/10/21 0900)     LOS: 16 days   Time spent=  35 mins    Sayra Frisby Joline Maxcy, MD [facility]  If 7PM-7AM, please contact night-coverage  03/10/2021, 11:36 AM

## 2021-03-10 NOTE — TOC Progression Note (Signed)
Transition of Care [facility]) - Progression Note    Patient Details  Name: ADDISON WHIDBEE MRN: [id number] Date of Birth: Mar 11, 1951  Transition of Care [facility]) CM/SW Contact  Epifanio Lesches, RN Phone Number: 03/10/2021, 1:10 PM  Clinical Narrative:     NCM made aware from [name] [phone]) pt received denial for trilogy approval. Order noted for Bipap, [employer] to provide. Bipap  will be setup @ bedside tonight to assess if  any adjustments are needed prior to potential d/c on tomorrow. MD to made referral with pulmonologist for scheduling split night sleep study.  TOC team will continue to monitor and assist with needs....   Expected Discharge Plan: Home/Self Care Barriers to Discharge: Other (must enter comment)  Expected Discharge Plan and Services Expected Discharge Plan: Home/Self Care         Expected Discharge Date: 02/27/21               DME Arranged: oxygen ,BIPAP  DME Agency: [employer] Date DME Agency Contacted: 02/25/21 Time DME Agency Contacted: [phone] Representative spoke with at DME Agency: Ian Malkin HH Arranged: NA HH Agency: NA         Social Determinants of Health (SDOH) Interventions    Readmission Risk Interventions No flowsheet data found.

## 2021-03-11 MED ORDER — POLYETHYLENE GLYCOL 3350 17 G PO PACK: 17.0000 g | PACK | Freq: Two times a day (BID) | ORAL | 2 refills | Status: DC | PRN

## 2021-03-11 MED ORDER — DOCUSATE SODIUM 100 MG PO CAPS: 100.0000 mg | ORAL_CAPSULE | Freq: Two times a day (BID) | ORAL | 0 refills | Status: DC | PRN

## 2021-03-11 NOTE — Discharge Summary (Signed)
Discharge summary has been completed by Dr. Butler Denmark on 02/27/2021.  Any updates as below.  Patient was noted to be iron deficient therefore received 3 doses of IV iron in the hospital and was advised to continue taking home oral iron along with bowel regimen as needed.  Also he was given his cardiac medications including Lasix, Jardiance and Entresto. Eventually he was declined for trilogy therefore home BiPAP was arranged which he tolerated here in the hospital.  He was also set up for outpatient split sleep study by pulmonary team in next 2 weeks.  He will also need outpatient gastroenterology follow-up in about 6 weeks time.  Patient understood his discharge planning.  On the day of discharge he was seen and examined at bedside and did not have any complaints at all.  He was excited to be discharged.  His lungs were clear to auscultation bilaterally, normal sinus rhythm, abdomen was nontender nondistended.  No lower extremity swelling was noted.  Patient is medically stable for discharge.  Patient's RN aware, med rec completed and updated.  Stephania Fragmin MD [facility]

## 2021-03-11 NOTE — Progress Notes (Addendum)
   03/11/21 0849  AVS Discharge Documentation  AVS Discharge Instructions Including Medications Provided to patient/caregiver  Name of Person Receiving AVS Discharge Instructions Including Medications Kristopher [name]  Name of Clinician That Reviewed AVS Discharge Instructions Including Medications Luanna Salk RN   Patient was discharged with home Bipap delivered by [facility].  Patient declined the home oxygen tank and portable concentrator.  Patient called [employer] [facility] and advised he was leaving equipment at hospital. He has been on room air 98% while ambulating around the unit.

## 2021-03-11 NOTE — Plan of Care (Signed)
  Problem: Education: Goal: Knowledge of General Education information will improve Description: Including pain rating scale, medication(s)/side effects and non-pharmacologic comfort measures Outcome: Progressing   Problem: Activity: Goal: Risk for activity intolerance will decrease Outcome: Progressing   

## 2021-03-12 ENCOUNTER — Other Ambulatory Visit (HOSPITAL_COMMUNITY): Payer: Self-pay | Admitting: Internal Medicine

## 2021-03-15 DIAGNOSIS — G4733 Obstructive sleep apnea (adult) (pediatric): Secondary | ICD-10-CM | POA: Diagnosis not present

## 2021-03-15 DIAGNOSIS — I509 Heart failure, unspecified: Secondary | ICD-10-CM | POA: Diagnosis not present

## 2021-03-15 DIAGNOSIS — D509 Iron deficiency anemia, unspecified: Secondary | ICD-10-CM | POA: Diagnosis not present

## 2021-03-15 DIAGNOSIS — N289 Disorder of kidney and ureter, unspecified: Secondary | ICD-10-CM | POA: Diagnosis not present

## 2021-03-15 DIAGNOSIS — J449 Chronic obstructive pulmonary disease, unspecified: Secondary | ICD-10-CM | POA: Diagnosis not present

## 2021-03-15 DIAGNOSIS — K279 Peptic ulcer, site unspecified, unspecified as acute or chronic, without hemorrhage or perforation: Secondary | ICD-10-CM | POA: Diagnosis not present

## 2021-03-16 ENCOUNTER — Other Ambulatory Visit (HOSPITAL_COMMUNITY): Payer: Self-pay

## 2021-03-17 ENCOUNTER — Other Ambulatory Visit: Payer: Self-pay

## 2021-03-17 ENCOUNTER — Encounter: Payer: Self-pay | Admitting: Primary Care

## 2021-03-17 ENCOUNTER — Ambulatory Visit (INDEPENDENT_AMBULATORY_CARE_PROVIDER_SITE_OTHER): Payer: Medicare Other | Admitting: Primary Care

## 2021-03-17 ENCOUNTER — Other Ambulatory Visit (HOSPITAL_COMMUNITY): Payer: Self-pay | Admitting: Internal Medicine

## 2021-03-17 DIAGNOSIS — G4733 Obstructive sleep apnea (adult) (pediatric): Secondary | ICD-10-CM

## 2021-03-17 DIAGNOSIS — J449 Chronic obstructive pulmonary disease, unspecified: Secondary | ICD-10-CM | POA: Diagnosis not present

## 2021-03-17 DIAGNOSIS — J9622 Acute and chronic respiratory failure with hypercapnia: Secondary | ICD-10-CM | POA: Diagnosis not present

## 2021-03-17 NOTE — Assessment & Plan Note (Signed)
-   Continue Symbicort, needs PFTs

## 2021-03-17 NOTE — Progress Notes (Signed)
@Patient  ID: Kristopher [name], male    DOB: [birth year], 70 y.o.   MRN: [id number]  Chief Complaint  Patient presents with   Follow-up    Patient reports that he is doing well.     Referring provider: Shirlean Mylar, MD  HPI: 70 year old male, former smoker quit in 2000 (22 pack y). PMH significant for OSA, COPD, acute on chronic respiratory failure, combined CHF, dilated cardiomyopathy, pulmonary HTN, CAD, obesity. Patient of Dr. Maple Hudson, seen for initial consult on 11/25/18.   Previous [facility] pulmonary encounter: 11/25/2018 Sleep study 10/28/2018-  home sleep study revealed Severe Obstructive Sleep Apnea with AHI 65.8/hr. Central sleep apnea was noted with an pAHIc of 11.4/hr. % Cheyne Stokes respirations was 10.3%. Nocturnal Hypoxemia was noted with O2 saturations as low as 72%. Time spent with Oxygen desaturations < 88% was 97.6 minutes.I -----Had home sleep study and is now getting established to receive a CPAP. He was on CPAP therapy while is rehab at [facility] but does not have one of his own in the home.  Body weight today 240 lbs Epworth score 8 He was on CPAP and liked it while at [facility], but could not take it home with him and needs to establish DME. [facility]. Son on CPAP.     03/17/2021- Interim hx  Patient presents today for hospital follow-up. Hx severe OSA. Admitted 02/22/21-03/11/21 for acute on chronic hypoxemia and hypercarbic respiratory failure, multifactorial d/t CHF, OSA/OHS. He required BIPAP initially, he was discharged on BIPAP (Trilogy was denied).  He was not discharged on oxygen.  He is doing well today. No acute respiratory complaints. His CPAP had broke 2-3 months prior this most recent hospitalization. He is now using BIPAP. He reports compliance with BIPAP, wearing on average 6-8 hours a night (10pm- 6am). No issues with mask fit or pressure setting. He is not on oxygen. He is on Symbicort for ? COPD/asthma. He uses albuterol nebulizer twice a day. No  PFTs on file. Occasional cough. Denies shortness of breath, chest tightness or wheezing.     Allergies  Allergen Reactions   Penicillins Other (See Comments)    Pt tolerated ceftriaxone. Pt reports it makes him feel shaky Has patient had a PCN reaction causing immediate rash, facial/tongue/throat swelling, SOB or lightheadedness with hypotension: YES Has patient had a PCN reaction causing severe rash involving mucus membranes or skin necrosis: NO Has patient had a PCN reaction that required hospitalization NO Has patient had a PCN reaction occurring within the last 10 years: NO If all of the above answers are "NO", then may proceed with Cephalosporin use.    Immunization History  Administered Date(s) Administered   PFIZER(Purple Top)SARS-COV-2 Vaccination 03/08/2020, 03/29/2020    Past Medical History:  Diagnosis Date   Acute combined systolic and diastolic heart failure (HCC) 08/11/2016   Acute esophagitis 08/17/2016   Acute upper GI bleed 08/10/2016   Asthma    CAD (coronary artery disease) 08/16/2016   CHF (congestive heart failure) (HCC)    COPD (chronic obstructive pulmonary disease) (HCC) 11/06/2019   DCM (dilated cardiomyopathy) (HCC)    Dyslipidemia 11/06/2019   Dyspnea    HTN (hypertension)    LBBB (left bundle branch block) 08/10/2016   Lymphadenopathy 08/17/2016   OSA (obstructive sleep apnea) 11/18/2018   [facility] home sleep study revealed Severe Obstructive Sleep Apnea with AHI 65.8/hr. Central sleep apnea was noted with an pAHIc of 11.4/hr. % Cheyne Stokes respirations was 10.3%. Nocturnal Hypoxemia was noted  with O2 saturations as low as 72%. Time spent with Oxygen desaturations < 88% was 97.6 minutes.   PUD (peptic ulcer disease) 08/17/2016   Pulmonary HTN (HCC) 08/17/2016    Tobacco History: Social History   Tobacco Use  Smoking Status Former   Packs/day: 0.50   Years: 44.00   Pack years: 22.00   Types: Cigarettes   Quit date: 06/11/1998   Years since quitting: 22.7   Smokeless Tobacco Never   Counseling given: Not Answered   Outpatient Medications Prior to Visit  Medication Sig Dispense Refill   acetaminophen (TYLENOL) 325 MG tablet Take 650 mg by mouth every 6 (six) hours as needed for mild pain.      albuterol (PROVENTIL) (2.5 MG/3ML) 0.083% nebulizer solution Inhale 3 mLs into the lungs in the morning and at bedtime.     aspirin 81 MG chewable tablet Chew 1 tablet (81 mg total) by mouth daily. 30 tablet 6   atorvastatin (LIPITOR) 40 MG tablet Take 1 tablet (40 mg total) by mouth daily at 6 PM. Needs appt for further refills 90 tablet 0   docusate sodium (COLACE) 100 MG capsule Take 1 capsule (100 mg total) by mouth 2 (two) times daily as needed for mild constipation. 30 capsule 0   empagliflozin (JARDIANCE) 10 MG TABS tablet Take 1 tablet (10 mg total) by mouth daily. 30 tablet 0   ferrous sulfate 325 (65 FE) MG tablet Take 325 mg by mouth daily with breakfast.     furosemide (LASIX) 40 MG tablet Take 2 tablets (80 mg total) by mouth 2 (two) times daily. Please call for office visit [phone] 60 tablet 0   furosemide (LASIX) 80 MG tablet Take 1 tablet (80 mg total) by mouth daily. 30 tablet 0   ipratropium (ATROVENT) 0.02 % nebulizer solution Take 2.5 mLs (0.5 mg total) by nebulization 2 (two) times daily. 75 mL 12   omeprazole (PRILOSEC) 40 MG capsule Take 40 mg by mouth daily as needed (gerd).      pantoprazole (PROTONIX) 40 MG tablet Take 1 tablet (40 mg total) by mouth 2 (two) times daily before a meal. 60 tablet 0   polyethylene glycol (MIRALAX / GLYCOLAX) 17 g packet Take 17 g by mouth 2 (two) times daily as needed for moderate constipation or severe constipation. 14 each 2   potassium chloride (KLOR-CON) 10 MEQ tablet Take 1 tablet (10 mEq total) by mouth daily. 30 tablet 3   sacubitril-valsartan (ENTRESTO) 49-51 MG Take 1 tablet by mouth 2 (two) times daily. 60 tablet 0   SYMBICORT 160-4.5 MCG/ACT inhaler Inhale 1 puff into the lungs in the  morning and at bedtime.     No facility-administered medications prior to visit.    Review of Systems  Review of Systems  Constitutional: Negative.   Respiratory:  Positive for cough. Negative for chest tightness, shortness of breath and wheezing.   Cardiovascular: Negative.     Physical Exam  BP 118/68 (BP Location: Left Arm, Patient Position: Sitting, Cuff Size: Large)   Pulse 98   Temp 98.3 F (36.8 C) (Oral)   Ht 5\' 7"  (1.702 m)   Wt 226 lb (102.5 kg)   SpO2 96%   BMI 35.40 kg/m  Physical Exam Constitutional:      Appearance: Normal appearance.  HENT:     Head: Normocephalic and atraumatic.  Cardiovascular:     Rate and Rhythm: Normal rate and regular rhythm.  Pulmonary:     Effort: Pulmonary effort is  normal.     Breath sounds: Normal breath sounds. No wheezing, rhonchi or rales.  Musculoskeletal:        General: Normal range of motion.  Skin:    General: Skin is warm and dry.  Neurological:     General: No focal deficit present.     Mental Status: He is alert and oriented to person, place, and time. Mental status is at baseline.  Psychiatric:        Mood and Affect: Mood normal.        Behavior: Behavior normal.        Thought Content: Thought content normal.        Judgment: Judgment normal.     Lab Results:  CBC    Component Value Date/Time   WBC 9.4 03/09/2021 0032   RBC 3.80 (L) 03/09/2021 0032   HGB 8.0 (L) 03/09/2021 0032   HGB 10.2 (L) 08/28/2016 1331   HCT 29.3 (L) 03/09/2021 0032   HCT 34.4 (L) 08/28/2016 1331   PLT 392 03/09/2021 0032   PLT 400 (H) 08/28/2016 1331   MCV 77.1 (L) 03/09/2021 0032   MCV 75 (L) 08/28/2016 1331   MCH 21.1 (L) 03/09/2021 0032   MCHC 27.3 (L) 03/09/2021 0032   RDW 19.9 (H) 03/09/2021 0032   RDW 17.8 (H) 08/28/2016 1331   LYMPHSABS 1.6 02/22/2021 1727   MONOABS 1.0 02/22/2021 1727   EOSABS 0.1 02/22/2021 1727   BASOSABS 0.1 02/22/2021 1727    BMET    Component Value Date/Time   NA 133 (L) 03/09/2021  0032   NA 138 11/19/2016 1501   K 4.0 03/09/2021 0032   CL 97 (L) 03/09/2021 0032   CO2 25 03/09/2021 0032   GLUCOSE 77 03/09/2021 0032   BUN 12 03/09/2021 0032   BUN 12 11/19/2016 1501   CREATININE 1.48 (H) 03/09/2021 0032   CALCIUM 8.9 03/09/2021 0032   GFRNONAA 51 (L) 03/09/2021 0032   GFRAA >60 11/08/2019 0643    BNP    Component Value Date/Time   BNP 480.0 (H) 02/22/2021 1727    ProBNP No results found for: PROBNP  Imaging: DG Chest 2 View  Result Date: 02/22/2021 CLINICAL DATA:  Shortness of breath. EXAM: CHEST - 2 VIEW COMPARISON:  Chest x-ray 07/30/2018. FINDINGS: The heart is enlarged. There is central pulmonary vascular congestion. There is no focal lung infiltrate, pleural effusion or pneumothorax. There are minimal atelectatic changes in the lung bases. No acute fractures are seen. Degenerative changes affect the spine. IMPRESSION: Cardiomegaly with central pulmonary vascular congestion. Electronically Signed   By: Darliss Cheney M.D.   On: 02/22/2021 17:55   DG Chest Port 1 View  Result Date: 02/24/2021 CLINICAL DATA:  Acute respiratory failure EXAM: PORTABLE CHEST 1 VIEW COMPARISON:  Chest radiograph 02/22/2021 FINDINGS: The heart is mildly enlarged, unchanged. The mediastinal contours are stable. Lung volumes are low. There is a small left pleural effusion with adjacent airspace disease, similar to the study from two days ago. There is no new focal airspace disease. There is no significant right pleural effusion. There is no pneumothorax. There is no acute osseous abnormality. IMPRESSION: Low lung volumes with a small left pleural effusion and adjacent airspace disease, not significantly changed. Electronically Signed   By: Lesia Hausen M.D.   On: 02/24/2021 08:32   ECHOCARDIOGRAM COMPLETE  Result Date: 02/23/2021    ECHOCARDIOGRAM REPORT   Patient Name:   Kristopher [name] [facility] Date of Exam: 02/23/2021 Medical Rec #:  [id number]  Height:       67.0 in Accession #:     [id number]    Weight:       236.3 lb Date of Birth:  05-10-1951     BSA:          2.171 m Patient Age:    69 years      BP:           93/53 mmHg Patient Gender: M             HR:           62 bpm. Exam Location:  Inpatient Procedure: 2D Echo Indications:    acute respiratory distress  History:        Patient has prior history of Echocardiogram examinations, most                 recent 11/13/2018. Cardiomyopathy, CAD, COPD and Pulmonary HTN,                 Arrythmias:LBBB, Signs/Symptoms:Shortness of Breath; Risk                 Factors:Hypertension, Dyslipidemia and Sleep Apnea.  Sonographer:    Delcie Roch RDCS Referring Phys: [id number] Clarene Critchley [facility]  Sonographer Comments: Technically difficult study due to poor echo windows. Image acquisition challenging due to patient body habitus. IMPRESSIONS  1. Left ventricular ejection fraction, by estimation, is 70 to 75%. The left ventricle has hyperdynamic function. The left ventricle has no regional wall motion abnormalities. There is mild left ventricular hypertrophy. Left ventricular diastolic parameters are indeterminate.  2. Right ventricular systolic function is normal. The right ventricular size is normal.  3. Left atrial size was moderately dilated.  4. Right atrial size was moderately dilated.  5. The mitral valve is normal in structure. Mild mitral valve regurgitation.  6. The aortic valve is tricuspid. Aortic valve regurgitation is mild. Mild aortic valve sclerosis is present, with no evidence of aortic valve stenosis.  7. The inferior vena cava is dilated in size with >50% respiratory variability, suggesting right atrial pressure of 8 mmHg. FINDINGS  Left Ventricle: Left ventricular ejection fraction, by estimation, is 70 to 75%. The left ventricle has hyperdynamic function. The left ventricle has no regional wall motion abnormalities. The left ventricular internal cavity size was normal in size. There is mild left ventricular hypertrophy. Left ventricular  diastolic parameters are indeterminate. Right Ventricle: The right ventricular size is normal. Right vetricular wall thickness was not assessed. Right ventricular systolic function is normal. Left Atrium: Left atrial size was moderately dilated. Right Atrium: Right atrial size was moderately dilated. Pericardium: Trivial pericardial effusion is present. Mitral Valve: The mitral valve is normal in structure. Mild mitral valve regurgitation. Tricuspid Valve: The tricuspid valve is normal in structure. Tricuspid valve regurgitation is mild. Aortic Valve: The aortic valve is tricuspid. Aortic valve regurgitation is mild. Mild aortic valve sclerosis is present, with no evidence of aortic valve stenosis. Pulmonic Valve: The pulmonic valve was normal in structure. Pulmonic valve regurgitation is not visualized. Aorta: The aortic root and ascending aorta are structurally normal, with no evidence of dilitation. Venous: The inferior vena cava is dilated in size with greater than 50% respiratory variability, suggesting right atrial pressure of 8 mmHg. IAS/Shunts: No atrial level shunt detected by color flow Doppler.  LEFT VENTRICLE PLAX 2D LVIDd:         5.50 cm  Diastology LVIDs:         3.10 cm  LV e' medial:    7.83 cm/s LV PW:         1.20 cm  LV E/e' medial:  13.3 LV IVS:        1.10 cm  LV e' lateral:   9.90 cm/s LVOT diam:     2.30 cm  LV E/e' lateral: 10.5 LV SV:         103 LV SV Index:   48 LVOT Area:     4.15 cm  RIGHT VENTRICLE             IVC RV S prime:     14.90 cm/s  IVC diam: 2.50 cm TAPSE (M-mode): 2.3 cm LEFT ATRIUM              Index       RIGHT ATRIUM           Index LA diam:        5.00 cm  2.30 cm/m  RA Area:     23.50 cm LA Vol (A2C):   99.7 ml  45.93 ml/m RA Volume:   81.20 ml  37.40 ml/m LA Vol (A4C):   96.7 ml  44.54 ml/m LA Biplane Vol: 101.0 ml 46.52 ml/m  AORTIC VALVE LVOT Vmax:   118.00 cm/s LVOT Vmean:  76.500 cm/s LVOT VTI:    0.249 m  AORTA Ao Root diam: 2.80 cm Ao Asc diam:  3.30 cm  MITRAL VALVE MV Area (PHT): 3.08 cm     SHUNTS MV Decel Time: 246 msec     Systemic VTI:  0.25 m MV E velocity: 104.00 cm/s  Systemic Diam: 2.30 cm MV A velocity: 103.00 cm/s MV E/A ratio:  1.01 Dietrich Pates MD Electronically signed by Dietrich Pates MD Signature Date/Time: 02/23/2021/4:08:10 PM    Final      Assessment & Plan:   Acute on chronic respiratory failure with hypercapnia (HCC) - He was not discharged on oxygen. Ambulatory O2 low 90% RA , recovered 93% with rest. Asymptomatic - Encourage weight loss and start exercise regimen  - Continue to monitor at follow-up  OSA (obstructive sleep apnea) - Hx severe OSA. Started on outpatient BIPAP October 1st 2022 after hospitalization. Trilogy was declined. Patient reports compliance with BIPAP, no issues with mask fit or pressure setting. Requesting download from DME company. No changes today. FU in 3 months.   COPD (chronic obstructive pulmonary disease) (HCC) - Continue Symbicort, needs PFTs    Glenford Bayley, NP 03/17/2021

## 2021-03-17 NOTE — Assessment & Plan Note (Addendum)
-   He was not discharged on oxygen. Ambulatory O2 low 90% RA , recovered 93% with rest. Asymptomatic - Encourage weight loss and start exercise regimen  - Continue to monitor at follow-up

## 2021-03-17 NOTE — Assessment & Plan Note (Signed)
-   Hx severe OSA. Started on outpatient BIPAP October 1st 2022 after hospitalization. Trilogy was declined. Patient reports compliance with BIPAP, no issues with mask fit or pressure setting. Requesting download from DME company. No changes today. FU in 3 months.

## 2021-03-17 NOTE — Patient Instructions (Addendum)
Nice seeing you today Kristopher [name], glad you are doing well  Recommendations: Continue to use BIPAP every night for 4-6 hours or longer Do not stop using, if machine has any issues call our office or [employer]  Orders: Please check O2 on room air PFTs in 3 months   Follow-up: 3 months with D. Young - 1 hour PFT prior   CPAP and BPAP Information CPAP and BPAP (also called BiPAP) are methods that use air pressure to keep your airways open and to help you breathe well. CPAP and BPAP use different amounts of pressure. Your health care provider will tell you whether CPAP or BPAP would be more helpful for you. CPAP stands for "continuous positive airway pressure." With CPAP, the amount of pressure stays the same while you breathe in (inhale) and out (exhale). BPAP stands for "bi-level positive airway pressure." With BPAP, the amount of pressure will be higher when you inhale and lower when you exhale. This allows you to take larger breaths. CPAP or BPAP may be used in the hospital, or your health care provider may want you to use it at home. You may need to have a sleep study before your health care provider can order a machine for you to use at home. What are the advantages? CPAP or BPAP can be helpful if you have: Sleep apnea. Chronic obstructive pulmonary disease (COPD). Heart failure. Medical conditions that cause muscle weakness, including muscular dystrophy or amyotrophic lateral sclerosis (ALS). Other problems that cause breathing to be shallow, weak, abnormal, or difficult. CPAP and BPAP are most commonly used for obstructive sleep apnea (OSA) to keep the airways from collapsing when the muscles relax during sleep. What are the risks? Generally, this is a safe treatment. However, problems may occur, including: Irritated skin or skin sores if the mask does not fit properly. Dry or stuffy nose or nosebleeds. Dry mouth. Feeling gassy or bloated. Sinus or lung infection if the equipment is  not cleaned properly. When should CPAP or BPAP be used? In most cases, the mask only needs to be worn during sleep. Generally, the mask needs to be worn throughout the night and during any daytime naps. People with certain medical conditions may also need to wear the mask at other times, such as when they are awake. Follow instructions from your health care provider about when to use the machine. What happens during CPAP or BPAP? Both CPAP and BPAP are provided by a small machine with a flexible plastic tube that attaches to a plastic mask that you wear. Air is blown through the mask into your nose or mouth. The amount of pressure that is used to blow the air can be adjusted on the machine. Your health care provider will set the pressure setting and help you find the best mask for you. Tips for using the mask Because the mask needs to be snug, some people feel trapped or closed-in (claustrophobic) when first using the mask. If you feel this way, you may need to get used to the mask. One way to do this is to hold the mask loosely over your nose or mouth and then gradually apply the mask more snugly. You can also gradually increase the amount of time that you use the mask. Masks are available in various types and sizes. If your mask does not fit well, talk with your health care provider about getting a different one. Some common types of masks include: Full face masks, which fit over the mouth  and nose. Nasal masks, which fit over the nose. Nasal pillow or prong masks, which fit into the nostrils. If you are using a mask that fits over your nose and you tend to breathe through your mouth, a chin strap may be applied to help keep your mouth closed. Use a skin barrier to protect your skin as told by your health care provider. Some CPAP and BPAP machines have alarms that may sound if the mask comes off or develops a leak. If you have trouble with the mask, it is very important that you talk with your health  care provider about finding a way to make the mask easier to tolerate. Do not stop using the mask. There could be a negative impact on your health if you stop using the mask. Tips for using the machine Place your CPAP or BPAP machine on a secure table or stand near an electrical outlet. Know where the on/off switch is on the machine. Follow instructions from your health care provider about how to set the pressure on your machine and when you should use it. Do not eat or drink while the CPAP or BPAP machine is on. Food or fluids could get pushed into your lungs by the pressure of the CPAP or BPAP. For home use, CPAP and BPAP machines can be rented or purchased through home health care companies. Many different brands of machines are available. Renting a machine before purchasing may help you find out which particular machine works well for you. Your health insurance company may also decide which machine you may get. Keep the CPAP or BPAP machine and attachments clean. Ask your health care provider for specific instructions. Check the humidifier if you have a dry stuffy nose or nosebleeds. Make sure it is working correctly. Follow these instructions at home: Take over-the-counter and prescription medicines only as told by your health care provider. Ask if you can take sinus medicine if your sinuses are blocked. Do not use any products that contain nicotine or tobacco. These products include cigarettes, chewing tobacco, and vaping devices, such as e-cigarettes. If you need help quitting, ask your health care provider. Keep all follow-up visits. This is important. Contact a health care provider if: You have redness or pressure sores on your head, face, mouth, or nose from the mask or head gear. You have trouble using the CPAP or BPAP machine. You cannot tolerate wearing the CPAP or BPAP mask. Someone tells you that you snore even when wearing your CPAP or BPAP. Get help right away if: You have trouble  breathing. You feel confused. Summary CPAP and BPAP are methods that use air pressure to keep your airways open and to help you breathe well. If you have trouble with the mask, it is very important that you talk with your health care provider about finding a way to make the mask easier to tolerate. Do not stop using the mask. There could be a negative impact to your health if you stop using the mask. Follow instructions from your health care provider about when to use the machine. This information is not intended to replace advice given to you by your health care provider. Make sure you discuss any questions you have with your health care provider. Document Revised: 05/06/2020 Document Reviewed: 05/06/2020 Elsevier Patient Education  2022 [employer].

## 2021-03-22 ENCOUNTER — Telehealth: Payer: Self-pay | Admitting: Primary Care

## 2021-03-22 DIAGNOSIS — D509 Iron deficiency anemia, unspecified: Secondary | ICD-10-CM | POA: Diagnosis not present

## 2021-03-22 DIAGNOSIS — G4733 Obstructive sleep apnea (adult) (pediatric): Secondary | ICD-10-CM

## 2021-03-22 DIAGNOSIS — N289 Disorder of kidney and ureter, unspecified: Secondary | ICD-10-CM | POA: Diagnosis not present

## 2021-03-22 NOTE — Telephone Encounter (Signed)
Can we get this split night study scheduled please or see when they can get him scheduled. Thanks

## 2021-03-22 NOTE — Telephone Encounter (Signed)
Yes split night was ordered back in September 2022. Needs done

## 2021-03-22 NOTE — Telephone Encounter (Signed)
EW please advise if okay to place order for in lab sleep study.   Thanks :)

## 2021-03-22 NOTE — Telephone Encounter (Signed)
I will call to get scheduled.

## 2021-03-23 DIAGNOSIS — L281 Prurigo nodularis: Secondary | ICD-10-CM | POA: Diagnosis not present

## 2021-03-23 NOTE — Telephone Encounter (Signed)
Kristopher [name] has printed this order & will call the patient tomorrow w/ this appt.

## 2021-03-23 NOTE — Telephone Encounter (Signed)
Split night has been ordered.

## 2021-03-23 NOTE — Telephone Encounter (Signed)
Per the [facility], they are not able to see the split night order.  Asks that correct order be placed so they can schedule.

## 2021-03-27 DIAGNOSIS — G4731 Primary central sleep apnea: Secondary | ICD-10-CM | POA: Diagnosis not present

## 2021-03-27 DIAGNOSIS — J45998 Other asthma: Secondary | ICD-10-CM | POA: Diagnosis not present

## 2021-03-28 NOTE — Telephone Encounter (Signed)
Order was placed and signed by [facility] on 03/23/21. Patient has been scheduled for a split night on 05/15/21 at 8pm. Will close this encounter.

## 2021-04-04 ENCOUNTER — Other Ambulatory Visit (HOSPITAL_COMMUNITY): Payer: Self-pay

## 2021-04-05 ENCOUNTER — Other Ambulatory Visit (HOSPITAL_COMMUNITY): Payer: Self-pay | Admitting: Internal Medicine

## 2021-04-06 ENCOUNTER — Other Ambulatory Visit (HOSPITAL_COMMUNITY): Payer: Self-pay

## 2021-04-12 ENCOUNTER — Other Ambulatory Visit (HOSPITAL_COMMUNITY): Payer: Self-pay | Admitting: Internal Medicine

## 2021-04-17 DIAGNOSIS — I272 Pulmonary hypertension, unspecified: Secondary | ICD-10-CM | POA: Diagnosis not present

## 2021-04-17 DIAGNOSIS — I5042 Chronic combined systolic (congestive) and diastolic (congestive) heart failure: Secondary | ICD-10-CM | POA: Diagnosis not present

## 2021-04-17 DIAGNOSIS — E785 Hyperlipidemia, unspecified: Secondary | ICD-10-CM | POA: Diagnosis not present

## 2021-04-17 DIAGNOSIS — J449 Chronic obstructive pulmonary disease, unspecified: Secondary | ICD-10-CM | POA: Diagnosis not present

## 2021-04-17 DIAGNOSIS — I509 Heart failure, unspecified: Secondary | ICD-10-CM | POA: Diagnosis not present

## 2021-04-17 DIAGNOSIS — J45998 Other asthma: Secondary | ICD-10-CM | POA: Diagnosis not present

## 2021-04-17 DIAGNOSIS — I2510 Atherosclerotic heart disease of native coronary artery without angina pectoris: Secondary | ICD-10-CM | POA: Diagnosis not present

## 2021-04-17 DIAGNOSIS — D509 Iron deficiency anemia, unspecified: Secondary | ICD-10-CM | POA: Diagnosis not present

## 2021-04-17 DIAGNOSIS — I10 Essential (primary) hypertension: Secondary | ICD-10-CM | POA: Diagnosis not present

## 2021-04-17 DIAGNOSIS — E1159 Type 2 diabetes mellitus with other circulatory complications: Secondary | ICD-10-CM | POA: Diagnosis not present

## 2021-04-17 DIAGNOSIS — E1165 Type 2 diabetes mellitus with hyperglycemia: Secondary | ICD-10-CM | POA: Diagnosis not present

## 2021-04-19 ENCOUNTER — Other Ambulatory Visit (HOSPITAL_COMMUNITY): Payer: Self-pay | Admitting: Cardiology

## 2021-04-27 ENCOUNTER — Other Ambulatory Visit (HOSPITAL_COMMUNITY): Payer: Self-pay | Admitting: Cardiology

## 2021-04-27 DIAGNOSIS — G4731 Primary central sleep apnea: Secondary | ICD-10-CM | POA: Diagnosis not present

## 2021-04-27 DIAGNOSIS — J45998 Other asthma: Secondary | ICD-10-CM | POA: Diagnosis not present

## 2021-04-28 ENCOUNTER — Other Ambulatory Visit (HOSPITAL_COMMUNITY): Payer: Self-pay | Admitting: Cardiology

## 2021-04-28 MED ORDER — SACUBITRIL-VALSARTAN 49-51 MG PO TABS
1.0000 | ORAL_TABLET | Freq: Two times a day (BID) | ORAL | 3 refills | Status: DC
Start: 2021-04-28 — End: 2021-05-30

## 2021-05-03 ENCOUNTER — Other Ambulatory Visit: Payer: Self-pay

## 2021-05-03 ENCOUNTER — Encounter (HOSPITAL_COMMUNITY): Payer: Self-pay

## 2021-05-03 ENCOUNTER — Emergency Department (HOSPITAL_COMMUNITY): Payer: Medicare Other

## 2021-05-03 ENCOUNTER — Inpatient Hospital Stay (HOSPITAL_COMMUNITY)
Admission: EM | Admit: 2021-05-03 | Discharge: 2021-05-09 | DRG: 193 | Disposition: A | Discharge status: Home / Self Care | Payer: Medicare Other | Attending: Internal Medicine | Admitting: Internal Medicine

## 2021-05-03 DIAGNOSIS — J189 Pneumonia, unspecified organism: Secondary | ICD-10-CM | POA: Diagnosis not present

## 2021-05-03 DIAGNOSIS — J441 Chronic obstructive pulmonary disease with (acute) exacerbation: Secondary | ICD-10-CM | POA: Diagnosis present

## 2021-05-03 DIAGNOSIS — R0902 Hypoxemia: Secondary | ICD-10-CM

## 2021-05-03 DIAGNOSIS — R059 Cough, unspecified: Secondary | ICD-10-CM | POA: Diagnosis not present

## 2021-05-03 DIAGNOSIS — I420 Dilated cardiomyopathy: Secondary | ICD-10-CM | POA: Diagnosis not present

## 2021-05-03 DIAGNOSIS — R9431 Abnormal electrocardiogram [ECG] [EKG]: Secondary | ICD-10-CM

## 2021-05-03 DIAGNOSIS — J9622 Acute and chronic respiratory failure with hypercapnia: Secondary | ICD-10-CM | POA: Diagnosis present

## 2021-05-03 DIAGNOSIS — I272 Pulmonary hypertension, unspecified: Secondary | ICD-10-CM | POA: Diagnosis present

## 2021-05-03 DIAGNOSIS — Z20822 Contact with and (suspected) exposure to covid-19: Secondary | ICD-10-CM | POA: Diagnosis present

## 2021-05-03 DIAGNOSIS — I5042 Chronic combined systolic (congestive) and diastolic (congestive) heart failure: Secondary | ICD-10-CM | POA: Diagnosis not present

## 2021-05-03 DIAGNOSIS — R062 Wheezing: Secondary | ICD-10-CM | POA: Diagnosis not present

## 2021-05-03 DIAGNOSIS — Z7951 Long term (current) use of inhaled steroids: Secondary | ICD-10-CM | POA: Diagnosis not present

## 2021-05-03 DIAGNOSIS — Z7982 Long term (current) use of aspirin: Secondary | ICD-10-CM | POA: Diagnosis not present

## 2021-05-03 DIAGNOSIS — G4733 Obstructive sleep apnea (adult) (pediatric): Secondary | ICD-10-CM | POA: Diagnosis not present

## 2021-05-03 DIAGNOSIS — Z79899 Other long term (current) drug therapy: Secondary | ICD-10-CM | POA: Diagnosis not present

## 2021-05-03 DIAGNOSIS — Z8711 Personal history of peptic ulcer disease: Secondary | ICD-10-CM

## 2021-05-03 DIAGNOSIS — I2510 Atherosclerotic heart disease of native coronary artery without angina pectoris: Secondary | ICD-10-CM | POA: Diagnosis not present

## 2021-05-03 DIAGNOSIS — E785 Hyperlipidemia, unspecified: Secondary | ICD-10-CM | POA: Diagnosis present

## 2021-05-03 DIAGNOSIS — Z743 Need for continuous supervision: Secondary | ICD-10-CM | POA: Diagnosis not present

## 2021-05-03 DIAGNOSIS — I5032 Chronic diastolic (congestive) heart failure: Secondary | ICD-10-CM | POA: Diagnosis not present

## 2021-05-03 DIAGNOSIS — R6889 Other general symptoms and signs: Secondary | ICD-10-CM | POA: Diagnosis not present

## 2021-05-03 DIAGNOSIS — J9811 Atelectasis: Secondary | ICD-10-CM | POA: Diagnosis not present

## 2021-05-03 DIAGNOSIS — I10 Essential (primary) hypertension: Secondary | ICD-10-CM | POA: Diagnosis not present

## 2021-05-03 DIAGNOSIS — Z9981 Dependence on supplemental oxygen: Secondary | ICD-10-CM | POA: Diagnosis not present

## 2021-05-03 DIAGNOSIS — Z87891 Personal history of nicotine dependence: Secondary | ICD-10-CM

## 2021-05-03 DIAGNOSIS — R0602 Shortness of breath: Secondary | ICD-10-CM | POA: Diagnosis not present

## 2021-05-03 DIAGNOSIS — Z7984 Long term (current) use of oral hypoglycemic drugs: Secondary | ICD-10-CM | POA: Diagnosis not present

## 2021-05-03 DIAGNOSIS — J9621 Acute and chronic respiratory failure with hypoxia: Secondary | ICD-10-CM | POA: Diagnosis not present

## 2021-05-03 DIAGNOSIS — Z8249 Family history of ischemic heart disease and other diseases of the circulatory system: Secondary | ICD-10-CM | POA: Diagnosis not present

## 2021-05-03 DIAGNOSIS — I110 Hypertensive heart disease with heart failure: Secondary | ICD-10-CM | POA: Diagnosis present

## 2021-05-03 DIAGNOSIS — J9601 Acute respiratory failure with hypoxia: Secondary | ICD-10-CM | POA: Diagnosis not present

## 2021-05-03 LAB — CBC WITH DIFFERENTIAL/PLATELET
Abs Immature Granulocytes: 0.07 10*3/uL (ref 0.00–0.07)
Basophils Absolute: 0.1 10*3/uL (ref 0.0–0.1)
Basophils Relative: 0 %
Eosinophils Absolute: 0.1 10*3/uL (ref 0.0–0.5)
Eosinophils Relative: 1 %
HCT: 30.8 % — ABNORMAL LOW (ref 39.0–52.0)
Hemoglobin: 8.6 g/dL — ABNORMAL LOW (ref 13.0–17.0)
Immature Granulocytes: 0 %
Lymphocytes Relative: 13 %
Lymphs Abs: 2.1 10*3/uL (ref 0.7–4.0)
MCH: 22.8 pg — ABNORMAL LOW (ref 26.0–34.0)
MCHC: 27.9 g/dL — ABNORMAL LOW (ref 30.0–36.0)
MCV: 81.7 fL (ref 80.0–100.0)
Monocytes Absolute: 1.2 10*3/uL — ABNORMAL HIGH (ref 0.1–1.0)
Monocytes Relative: 7 %
Neutro Abs: 13 10*3/uL — ABNORMAL HIGH (ref 1.7–7.7)
Neutrophils Relative %: 79 %
Platelets: 419 10*3/uL — ABNORMAL HIGH (ref 150–400)
RBC: 3.77 MIL/uL — ABNORMAL LOW (ref 4.22–5.81)
RDW: 18.3 % — ABNORMAL HIGH (ref 11.5–15.5)
WBC: 16.5 10*3/uL — ABNORMAL HIGH (ref 4.0–10.5)
nRBC: 0.1 % (ref 0.0–0.2)

## 2021-05-03 LAB — COMPREHENSIVE METABOLIC PANEL
ALT: 9 U/L (ref 0–44)
AST: 15 U/L (ref 15–41)
Albumin: 3.3 g/dL — ABNORMAL LOW (ref 3.5–5.0)
Alkaline Phosphatase: 79 U/L (ref 38–126)
Anion gap: 10 (ref 5–15)
BUN: 12 mg/dL (ref 8–23)
CO2: 30 mmol/L (ref 22–32)
Calcium: 8.7 mg/dL — ABNORMAL LOW (ref 8.9–10.3)
Chloride: 95 mmol/L — ABNORMAL LOW (ref 98–111)
Creatinine, Ser: 1.1 mg/dL (ref 0.61–1.24)
GFR, Estimated: >60 mL/min (ref >60)
Glucose, Bld: 99 mg/dL (ref 70–99)
Potassium: 3.1 mmol/L — ABNORMAL LOW (ref 3.5–5.1)
Sodium: 135 mmol/L (ref 135–145)
Total Bilirubin: 0.6 mg/dL (ref 0.3–1.2)
Total Protein: 7 g/dL (ref 6.5–8.1)

## 2021-05-03 LAB — I-STAT VENOUS BLOOD GAS, ED
Acid-Base Excess: 8 mmol/L — ABNORMAL HIGH (ref 0.0–2.0)
Bicarbonate: 34.3 mmol/L — ABNORMAL HIGH (ref 20.0–28.0)
Calcium, Ion: 1.07 mmol/L — ABNORMAL LOW (ref 1.15–1.40)
HCT: 31 % — ABNORMAL LOW (ref 39.0–52.0)
Hemoglobin: 10.5 g/dL — ABNORMAL LOW (ref 13.0–17.0)
O2 Saturation: 94 %
Potassium: 3.1 mmol/L — ABNORMAL LOW (ref 3.5–5.1)
Sodium: 136 mmol/L (ref 135–145)
TCO2: 36 mmol/L — ABNORMAL HIGH (ref 22–32)
pCO2, Ven: 53.6 mmHg (ref 44.0–60.0)
pH, Ven: 7.414 (ref 7.250–7.430)
pO2, Ven: 70 mmHg — ABNORMAL HIGH (ref 32.0–45.0)

## 2021-05-03 LAB — RESP PANEL BY RT-PCR (FLU A&B, COVID) ARPGX2
Influenza A by PCR: NEGATIVE
Influenza B by PCR: NEGATIVE
SARS Coronavirus 2 by RT PCR: NEGATIVE

## 2021-05-03 LAB — TROPONIN I (HIGH SENSITIVITY)
Troponin I (High Sensitivity): 12 ng/L (ref <18)
Troponin I (High Sensitivity): 15 ng/L (ref <18)

## 2021-05-03 MED ORDER — MAGNESIUM SULFATE 2 GM/50ML IV SOLN
2.0000 g | Freq: Once | INTRAVENOUS | Status: AC
  Administered 2021-05-03: 2 g via INTRAVENOUS
  Filled 2021-05-03: qty 50

## 2021-05-03 MED ORDER — METHYLPREDNISOLONE SODIUM SUCC 125 MG IJ SOLR
125.0000 mg | Freq: Once | INTRAMUSCULAR | Status: AC
  Administered 2021-05-03: 125 mg via INTRAVENOUS
  Filled 2021-05-03: qty 2

## 2021-05-03 MED ORDER — IPRATROPIUM-ALBUTEROL 0.5-2.5 (3) MG/3ML IN SOLN
3.0000 mL | Freq: Once | RESPIRATORY_TRACT | Status: AC
  Administered 2021-05-03: 3 mL via RESPIRATORY_TRACT
  Filled 2021-05-03: qty 3

## 2021-05-03 MED ORDER — STERILE WATER FOR INJECTION IJ SOLN
INTRAMUSCULAR | Status: AC
  Filled 2021-05-03: qty 10

## 2021-05-03 NOTE — ED Notes (Signed)
Patient transported to X-ray 

## 2021-05-03 NOTE — ED Notes (Signed)
ED Provider at bedside. 

## 2021-05-03 NOTE — ED Provider Notes (Signed)
[facility] EMERGENCY DEPARTMENT Provider Note   CSN: [id number] Arrival date & time: 05/03/21  1841     History Chief Complaint  Patient presents with   Shortness of Breath    Kristopher [name] is a 70 y.o. male.  HPI Patient is a 69 year old male with history of CAD, CHF, COPD, OSA, pulmonary hypertension, who presents to the emergency department due to a productive cough.  Patient states that he had cough, headache, sore throat about 3 weeks ago.  His cough was dry at the time and his symptoms resolved about 2 weeks ago.  He states that about 1.5 weeks ago his cough returned and became productive.  Reports yellow/green sputum.  Reports associated shortness of breath.  States he has been using his inhalers as well as nebulizers with mild short-term relief but has had to increase the frequency that he uses them.  He noted an episode of chest pain in his left chest earlier today which he states was brief and resolved spontaneously.  Denies any recurrent chest pain and denies any currently.  States that he noticed that his oxygen levels were dropping in the low 80s on room air so he called EMS.  States that he wears a CPAP at night but otherwise does not have a home oxygen requirement.  No sore throat, rhinorrhea, abdominal pain, nausea, vomiting, diarrhea.  States he is a former smoker and stopped smoking more than 20 years ago.    Past Medical History:  Diagnosis Date   Acute combined systolic and diastolic heart failure (HCC) 08/11/2016   Acute esophagitis 08/17/2016   Acute upper GI bleed 08/10/2016   Asthma    CAD (coronary artery disease) 08/16/2016   CHF (congestive heart failure) (HCC)    COPD (chronic obstructive pulmonary disease) (HCC) 11/06/2019   DCM (dilated cardiomyopathy) (HCC)    Dyslipidemia 11/06/2019   Dyspnea    HTN (hypertension)    LBBB (left bundle branch block) 08/10/2016   Lymphadenopathy 08/17/2016   OSA (obstructive sleep apnea) 11/18/2018   [place] home  sleep study revealed Severe Obstructive Sleep Apnea with AHI 65.8/hr. Central sleep apnea was noted with an pAHIc of 11.4/hr. % Cheyne Stokes respirations was 10.3%. Nocturnal Hypoxemia was noted with O2 saturations as low as 72%. Time spent with Oxygen desaturations < 88% was 97.6 minutes.   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   AKI (acute kidney injury) (HCC) 02/27/2021   IDA (iron deficiency anemia) 02/27/2021   Acute on chronic respiratory failure with hypercapnia (HCC) 02/22/2021   Symptomatic anemia 11/06/2019   Thrombocytosis 11/06/2019   COPD (chronic obstructive pulmonary disease) (HCC) 11/06/2019   Dyslipidemia 11/06/2019   OSA (obstructive sleep apnea) 11/18/2018   Morbid obesity (HCC) 11/18/2018   PUD (peptic ulcer disease) 08/17/2016   Acute esophagitis 08/17/2016   Pulmonary HTN (HCC) 08/17/2016   Lymphadenopathy 08/17/2016   CAD (coronary artery disease) 08/16/2016   Anemia    DCM (dilated cardiomyopathy) (HCC)    HTN (hypertension)    Chronic combined systolic and diastolic heart failure (HCC) 08/11/2016   Acute blood loss anemia 08/11/2016   Acute upper GI bleed 08/10/2016   DOE (dyspnea on exertion) 08/10/2016   LBBB (left bundle branch block) 08/10/2016    Past Surgical History:  Procedure Laterality Date   BIOPSY  11/07/2019   Procedure: BIOPSY;  Surgeon: Levora Angel,  Darcus Austin, MD;  Location: [facility];  Service: Gastroenterology;;   BIOPSY  11/08/2019   Procedure: BIOPSY;  Surgeon: [name], MD;  Location: [facility];  Service: Gastroenterology;;   BIOPSY  02/25/2021   Procedure: BIOPSY;  Surgeon: [name], MD;  Location: [facility];  Service: Gastroenterology;;   COLONOSCOPY WITH PROPOFOL N/A 11/08/2019   Procedure: COLONOSCOPY WITH PROPOFOL;  Surgeon: [name], MD;  Location: [facility];  Service:  Gastroenterology;  Laterality: N/A;   ESOPHAGOGASTRODUODENOSCOPY (EGD) WITH PROPOFOL Left 08/11/2016   Procedure: ESOPHAGOGASTRODUODENOSCOPY (EGD) WITH PROPOFOL;  Surgeon: Willis Modena, MD;  Location: [facility] ENDOSCOPY;  Service: Endoscopy;  Laterality: Left;   ESOPHAGOGASTRODUODENOSCOPY (EGD) WITH PROPOFOL Left 11/07/2019   Procedure: ESOPHAGOGASTRODUODENOSCOPY (EGD) WITH PROPOFOL;  Surgeon: [name], MD;  Location: [facility];  Service: Gastroenterology;  Laterality: Left;   ESOPHAGOGASTRODUODENOSCOPY (EGD) WITH PROPOFOL N/A 02/25/2021   Procedure: ESOPHAGOGASTRODUODENOSCOPY (EGD) WITH PROPOFOL;  Surgeon: [name], MD;  Location: [facility];  Service: Gastroenterology;  Laterality: N/A;   POLYPECTOMY  11/08/2019   Procedure: POLYPECTOMY;  Surgeon: [name], MD;  Location: [facility];  Service: Gastroenterology;;   RIGHT/LEFT HEART CATH AND CORONARY ANGIOGRAPHY N/A 08/15/2016   Procedure: Right/Left Heart Cath and Coronary Angiography;  Surgeon: Corky Crafts, MD;  Location: [facility] INVASIVE CV LAB;  Service: Cardiovascular;  Laterality: N/A;   RIGHT/LEFT HEART CATH AND CORONARY ANGIOGRAPHY N/A 08/04/2018   Procedure: RIGHT/LEFT HEART CATH AND CORONARY ANGIOGRAPHY;  Surgeon: Dolores Patty, MD;  Location: [facility] INVASIVE CV LAB;  Service: Cardiovascular;  Laterality: N/A;       Family History  Problem Relation Age of Onset   Hypertension Mother    Hypertension Sister     Social History   Tobacco Use   Smoking status: Former    Packs/day: 0.50    Years: 44.00    Pack years: 22.00    Types: Cigarettes    Quit date: 06/11/1998    Years since quitting: 22.9   Smokeless tobacco: Never  Vaping Use   Vaping Use: Never used  Substance Use Topics   Alcohol use: Not Currently    Comment: h/o heavy use   Drug use: Not Currently    Home Medications Prior to Admission medications   Medication Sig Start Date End Date Taking? Authorizing Provider  acetaminophen  (TYLENOL) 325 MG tablet Take 650 mg by mouth every 6 (six) hours as needed for mild pain.     [provider]  albuterol (PROVENTIL) (2.5 MG/3ML) 0.083% nebulizer solution Inhale 3 mLs into the lungs in the morning and at bedtime. 06/20/18   [provider]  aspirin 81 MG chewable tablet Chew 1 tablet (81 mg total) by mouth daily. 09/15/18   Alford Highland, NP  atorvastatin (LIPITOR) 40 MG tablet Take 1 tablet (40 mg total) by mouth daily. Needs appt 03/17/21   Bensimhon, Bevelyn Buckles, MD  docusate sodium (COLACE) 100 MG capsule Take 1 capsule (100 mg total) by mouth 2 (two) times daily as needed for mild constipation. 03/11/21   Amin, Loura Halt, MD  empagliflozin (JARDIANCE) 10 MG TABS tablet Take 1 tablet (10 mg total) by mouth daily. 02/28/21   Calvert Cantor, MD  ferrous sulfate 325 (65 FE) MG tablet Take 325 mg by mouth daily with breakfast.    [provider]  furosemide (LASIX) 40 MG tablet Take 1 tablet by mouth twice daily 04/27/21   Laurey Morale, MD  furosemide (LASIX) 80 MG tablet Take 1 tablet (80 mg  total) by mouth daily. 02/28/21   Calvert Cantor, MD  ipratropium (ATROVENT) 0.02 % nebulizer solution Take 2.5 mLs (0.5 mg total) by nebulization 2 (two) times daily. 08/08/18   Zigmund Daniel., MD  omeprazole (PRILOSEC) 40 MG capsule Take 40 mg by mouth daily as needed (gerd).     [provider]  pantoprazole (PROTONIX) 40 MG tablet Take 1 tablet (40 mg total) by mouth 2 (two) times daily before a meal. 02/27/21   Calvert Cantor, MD  polyethylene glycol (MIRALAX / GLYCOLAX) 17 g packet Take 17 g by mouth 2 (two) times daily as needed for moderate constipation or severe constipation. 03/11/21   Amin, Loura Halt, MD  potassium chloride (KLOR-CON) 10 MEQ tablet Take 1 tablet (10 mEq total) by mouth daily. 06/29/19   Bensimhon, Bevelyn Buckles, MD  potassium chloride (KLOR-CON) 10 MEQ tablet TAKE 1 TABLET BY MOUTH ONCE DAILY . APPOINTMENT REQUIRED FOR FUTURE REFILLS  04/14/21   Bensimhon, Bevelyn Buckles, MD  sacubitril-valsartan (ENTRESTO) 49-51 MG Take 1 tablet by mouth 2 (two) times daily. 04/28/21   Bensimhon, Bevelyn Buckles, MD  SYMBICORT 160-4.5 MCG/ACT inhaler Inhale 1 puff into the lungs in the morning and at bedtime. 09/08/19   [provider]    Allergies    Penicillins  Review of Systems   Review of Systems  All other systems reviewed and are negative. Ten systems reviewed and are negative for acute change, except as noted in the HPI.   Physical Exam Updated Vital Signs BP 138/70   Pulse 99   Temp 98.5 F (36.9 C) (Oral)   Resp 15   Ht 5\' 7"  (1.702 m)   Wt 101.2 kg   SpO2 94%   BMI 34.93 kg/m   Physical Exam Vitals and nursing note reviewed.  Constitutional:      General: He is not in acute distress.    Appearance: Normal appearance. He is well-developed. He is not ill-appearing, toxic-appearing or diaphoretic.     Interventions: He is not intubated. HENT:     Head: Normocephalic and atraumatic.     Right Ear: External ear normal.     Left Ear: External ear normal.     Nose: Nose normal.     Mouth/Throat:     Mouth: Mucous membranes are moist.     Pharynx: Oropharynx is clear. No oropharyngeal exudate or posterior oropharyngeal erythema.  Eyes:     Extraocular Movements: Extraocular movements intact.  Cardiovascular:     Rate and Rhythm: Regular rhythm. Tachycardia present. No extrasystoles are present.    Pulses: Normal pulses. No decreased pulses.     Heart sounds: Normal heart sounds. No murmur heard.   No friction rub. No gallop.  Pulmonary:     Effort: Pulmonary effort is normal. No tachypnea, accessory muscle usage or respiratory distress. He is not intubated.     Breath sounds: No stridor. Examination of the right-middle field reveals decreased breath sounds and wheezing. Examination of the left-middle field reveals decreased breath sounds and wheezing. Examination of the right-lower field reveals decreased breath  sounds and wheezing. Examination of the left-lower field reveals decreased breath sounds and wheezing. Decreased breath sounds and wheezing present. No rhonchi or rales.     Comments: On 4 L of O2 via nasal cannula with oxygen saturations in the high 90s. Abdominal:     General: Abdomen is flat.     Palpations: Abdomen is soft.     Tenderness: There is no abdominal tenderness.  Musculoskeletal:        General: Normal range of motion.     Cervical back: Normal range of motion and neck supple. No tenderness.     Right lower leg: No edema.     Left lower leg: No edema.  Skin:    General: Skin is warm and dry.  Neurological:     General: No focal deficit present.     Mental Status: He is alert and oriented to person, place, and time.  Psychiatric:        Mood and Affect: Mood normal.        Behavior: Behavior normal.   ED Results / Procedures / Treatments   Labs (all labs ordered are listed, but only abnormal results are displayed) Labs Reviewed  CBC WITH DIFFERENTIAL/PLATELET - Abnormal; Notable for the following components:      Result Value   WBC 16.5 (*)    RBC 3.77 (*)    Hemoglobin 8.6 (*)    HCT 30.8 (*)    MCH 22.8 (*)    MCHC 27.9 (*)    RDW 18.3 (*)    Platelets 419 (*)    Neutro Abs 13.0 (*)    Monocytes Absolute 1.2 (*)    All other components within normal limits  COMPREHENSIVE METABOLIC PANEL - Abnormal; Notable for the following components:   Potassium 3.1 (*)    Chloride 95 (*)    Calcium 8.7 (*)    Albumin 3.3 (*)    All other components within normal limits  I-STAT VENOUS BLOOD GAS, ED - Abnormal; Notable for the following components:   pO2, Ven 70.0 (*)    Bicarbonate 34.3 (*)    TCO2 36 (*)    Acid-Base Excess 8.0 (*)    Potassium 3.1 (*)    Calcium, Ion 1.07 (*)    HCT 31.0 (*)    Hemoglobin 10.5 (*)    All other components within normal limits  RESP PANEL BY RT-PCR (FLU A&B, COVID) ARPGX2  D-DIMER, QUANTITATIVE  TROPONIN I (HIGH SENSITIVITY)   TROPONIN I (HIGH SENSITIVITY)   EKG EKG Interpretation  Date/Time:  Wednesday May 03 2021 18:58:10 EST Ventricular Rate:  95 PR Interval:  194 QRS Duration: 146 QT Interval:  414 QTC Calculation: 521 R Axis:   -46 Text Interpretation: Sinus rhythm Left bundle branch block Confirmed by Virgina Norfolk ([id number]) on 05/03/2021 7:02:29 PM  Radiology DG Chest 2 View  Result Date: 05/03/2021 CLINICAL DATA:  Cough, shortness of breath EXAM: CHEST - 2 VIEW COMPARISON:  02/24/2021 FINDINGS: Stable cardiomediastinal contours. Low lung volumes with mild bibasilar atelectasis. No new focal airspace consolidation. No pleural effusion or pneumothorax. IMPRESSION: Low lung volumes with mild bibasilar atelectasis. Electronically Signed   By: Duanne Guess D.O.   On: 05/03/2021 20:25    Procedures Procedures   Medications Ordered in ED Medications  ipratropium-albuterol (DUONEB) 0.5-2.5 (3) MG/3ML nebulizer solution 3 mL (3 mLs Nebulization Given 05/03/21 1956)  magnesium sulfate IVPB 2 g 50 mL (0 g Intravenous Stopped 05/03/21 2115)  methylPREDNISolone sodium succinate (SOLU-MEDROL) 125 mg/2 mL injection 125 mg (125 mg Intravenous Given 05/03/21 1955)  sterile water (preservative free) injection (  Given by Other 05/03/21 2008)  ipratropium-albuterol (DUONEB) 0.5-2.5 (3) MG/3ML nebulizer solution 3 mL (3 mLs Nebulization Given 05/03/21 2244)    ED Course  I have reviewed the triage vital signs and the nursing notes.  Pertinent labs & imaging results that were available during my care of the patient  were reviewed by me and considered in my medical decision making (see chart for details).  Clinical Course as of 05/03/21 2318  Wed May 03, 2021  2134 Patient reassessed.  Still exhibiting expiratory wheezing bilaterally.  Oxygen levels desaturated to 83% on room air while ambulating in place.  Patient has no home oxygen requirement.  He does note that his symptoms have improved moderately.  We  will give an additional DuoNeb and reassess. [LJ]    Clinical Course User Index [LJ] Placido Sou, PA-C   MDM Rules/Calculators/A&P                          Pt is a 70 y.o. male who presents to the emergency department due to productive cough as well as shortness of breath.  Labs: CBC with a white count of 16.5, hemoglobin of 8.6, RDW of 18.3, platelets of 419, neutrophils of 13, monocytes of 1.2. CMP with a potassium of 3.1, chloride of 95, calcium of 8.7, albumin of 3.3. VBG with a PO2 of 70, HCO3 of 34.3. Troponin of 12 with a repeat of 15.  Imaging: Chest x-ray shows low lung volumes with mild bibasilar atelectasis.  I, Placido Sou, PA-C, personally reviewed and evaluated these images and lab results as part of my medical decision-making.  Patient with a history of COPD.  Patient with decreased breath sounds as well as expiratory wheezing on my exam initially.  He was given Solu-Medrol, magnesium, as well as multiple duo nebs.  States his symptoms have improved but he is still desaturating when ambulating in place to the low 80s.  Given patient's age, presentation, as well as comorbidities feel that he will require admission for further management.  This was discussed with the patient and he is amenable.  We will discuss with the medicine team.  Note: Portions of this report may have been transcribed using voice recognition software. Every effort was made to ensure accuracy; however, inadvertent computerized transcription errors may be present.   Final Clinical Impression(s) / ED Diagnoses Final diagnoses:  COPD exacerbation [facility])   Rx / DC Orders ED Discharge Orders     None        Placido Sou, PA-C 05/03/21 2319    Virgina Norfolk, DO 05/03/21 2340

## 2021-05-03 NOTE — ED Triage Notes (Signed)
SOB x 4 days, used neb x 4/day and inhaler x 2/day, placed on 4 liters 02 and sat 98%

## 2021-05-04 DIAGNOSIS — J441 Chronic obstructive pulmonary disease with (acute) exacerbation: Secondary | ICD-10-CM | POA: Diagnosis not present

## 2021-05-04 LAB — EXPECTORATED SPUTUM ASSESSMENT W GRAM STAIN, RFLX TO RESP C

## 2021-05-04 LAB — BASIC METABOLIC PANEL
Anion gap: 9 (ref 5–15)
BUN: 13 mg/dL (ref 8–23)
CO2: 30 mmol/L (ref 22–32)
Calcium: 8.9 mg/dL (ref 8.9–10.3)
Chloride: 94 mmol/L — ABNORMAL LOW (ref 98–111)
Creatinine, Ser: 1.03 mg/dL (ref 0.61–1.24)
GFR, Estimated: >60 mL/min (ref >60)
Glucose, Bld: 142 mg/dL — ABNORMAL HIGH (ref 70–99)
Potassium: 3.6 mmol/L (ref 3.5–5.1)
Sodium: 133 mmol/L — ABNORMAL LOW (ref 135–145)

## 2021-05-04 LAB — CBC
HCT: 31.9 % — ABNORMAL LOW (ref 39.0–52.0)
Hemoglobin: 9 g/dL — ABNORMAL LOW (ref 13.0–17.0)
MCH: 22.8 pg — ABNORMAL LOW (ref 26.0–34.0)
MCHC: 28.2 g/dL — ABNORMAL LOW (ref 30.0–36.0)
MCV: 80.8 fL (ref 80.0–100.0)
Platelets: 461 10*3/uL — ABNORMAL HIGH (ref 150–400)
RBC: 3.95 MIL/uL — ABNORMAL LOW (ref 4.22–5.81)
RDW: 18.4 % — ABNORMAL HIGH (ref 11.5–15.5)
WBC: 16.7 10*3/uL — ABNORMAL HIGH (ref 4.0–10.5)
nRBC: 0 % (ref 0.0–0.2)

## 2021-05-04 LAB — D-DIMER, QUANTITATIVE: D-Dimer, Quant: 0.87 ug/mL-FEU — ABNORMAL HIGH (ref 0.00–0.50)

## 2021-05-04 MED ORDER — GUAIFENESIN ER 600 MG PO TB12
1200.0000 mg | ORAL_TABLET | Freq: Two times a day (BID) | ORAL | Status: AC
  Administered 2021-05-04 – 2021-05-06 (×4): 1200 mg via ORAL
  Filled 2021-05-04 (×5): qty 2

## 2021-05-04 MED ORDER — ATORVASTATIN CALCIUM 40 MG PO TABS
40.0000 mg | ORAL_TABLET | Freq: Every day | ORAL | Status: DC
  Administered 2021-05-04 – 2021-05-09 (×6): 40 mg via ORAL
  Filled 2021-05-04 (×6): qty 1

## 2021-05-04 MED ORDER — FUROSEMIDE 40 MG PO TABS
40.0000 mg | ORAL_TABLET | Freq: Two times a day (BID) | ORAL | Status: DC
  Administered 2021-05-04 – 2021-05-07 (×9): 40 mg via ORAL
  Filled 2021-05-04: qty 2
  Filled 2021-05-04 (×5): qty 1
  Filled 2021-05-04: qty 2
  Filled 2021-05-04 (×2): qty 1

## 2021-05-04 MED ORDER — LACTATED RINGERS IV SOLN: INTRAVENOUS | Status: DC

## 2021-05-04 MED ORDER — EMPAGLIFLOZIN 10 MG PO TABS
10.0000 mg | ORAL_TABLET | Freq: Every day | ORAL | Status: DC
  Administered 2021-05-04 – 2021-05-09 (×6): 10 mg via ORAL
  Filled 2021-05-04 (×6): qty 1

## 2021-05-04 MED ORDER — ACETAMINOPHEN 650 MG RE SUPP: 650.0000 mg | Freq: Four times a day (QID) | RECTAL | Status: DC | PRN

## 2021-05-04 MED ORDER — ORAL CARE MOUTH RINSE
15.0000 mL | Freq: Two times a day (BID) | OROMUCOSAL | Status: DC
  Administered 2021-05-04 – 2021-05-08 (×8): 15 mL via OROMUCOSAL

## 2021-05-04 MED ORDER — ENOXAPARIN SODIUM 60 MG/0.6ML IJ SOSY
50.0000 mg | PREFILLED_SYRINGE | INTRAMUSCULAR | Status: DC
  Administered 2021-05-04 – 2021-05-09 (×6): 50 mg via SUBCUTANEOUS
  Filled 2021-05-04 (×2): qty 0.6
  Filled 2021-05-04: qty 0.5
  Filled 2021-05-04 (×3): qty 0.6

## 2021-05-04 MED ORDER — ASPIRIN 81 MG PO CHEW
81.0000 mg | CHEWABLE_TABLET | Freq: Every day | ORAL | Status: DC
  Administered 2021-05-04 – 2021-05-09 (×6): 81 mg via ORAL
  Filled 2021-05-04 (×6): qty 1

## 2021-05-04 MED ORDER — ALUM & MAG HYDROXIDE-SIMETH 200-200-20 MG/5ML PO SUSP
30.0000 mL | ORAL | Status: DC | PRN
  Administered 2021-05-04 – 2021-05-08 (×6): 30 mL via ORAL
  Filled 2021-05-04 (×7): qty 30

## 2021-05-04 MED ORDER — SODIUM CHLORIDE 3 % IN NEBU
4.0000 mL | INHALATION_SOLUTION | Freq: Two times a day (BID) | RESPIRATORY_TRACT | Status: DC
  Administered 2021-05-04 – 2021-05-05 (×2): 4 mL via RESPIRATORY_TRACT
  Filled 2021-05-04 (×2): qty 4

## 2021-05-04 MED ORDER — METHYLPREDNISOLONE SODIUM SUCC 40 MG IJ SOLR
40.0000 mg | Freq: Two times a day (BID) | INTRAMUSCULAR | Status: AC
  Administered 2021-05-04 – 2021-05-07 (×6): 40 mg via INTRAVENOUS
  Filled 2021-05-04 (×6): qty 1

## 2021-05-04 MED ORDER — SACUBITRIL-VALSARTAN 49-51 MG PO TABS
1.0000 | ORAL_TABLET | Freq: Two times a day (BID) | ORAL | Status: DC
  Administered 2021-05-04 – 2021-05-09 (×12): 1 via ORAL
  Filled 2021-05-04 (×12): qty 1

## 2021-05-04 MED ORDER — SODIUM CHLORIDE 3 % IN NEBU: 4.0000 mL | INHALATION_SOLUTION | Freq: Two times a day (BID) | RESPIRATORY_TRACT | Status: DC

## 2021-05-04 MED ORDER — FUROSEMIDE 20 MG PO TABS: 40.0000 mg | ORAL_TABLET | Freq: Two times a day (BID) | ORAL | Status: DC

## 2021-05-04 MED ORDER — IPRATROPIUM-ALBUTEROL 0.5-2.5 (3) MG/3ML IN SOLN: 3.0000 mL | RESPIRATORY_TRACT | Status: DC | PRN

## 2021-05-04 MED ORDER — SENNOSIDES-DOCUSATE SODIUM 8.6-50 MG PO TABS: 1.0000 | ORAL_TABLET | Freq: Every evening | ORAL | Status: DC | PRN

## 2021-05-04 MED ORDER — SODIUM CHLORIDE 0.9 % IV SOLN
1.0000 g | INTRAVENOUS | Status: AC
  Administered 2021-05-04 – 2021-05-06 (×3): 1 g via INTRAVENOUS
  Filled 2021-05-04 (×3): qty 10

## 2021-05-04 MED ORDER — POTASSIUM CHLORIDE CRYS ER 10 MEQ PO TBCR
10.0000 meq | EXTENDED_RELEASE_TABLET | Freq: Every day | ORAL | Status: DC
  Administered 2021-05-04 – 2021-05-07 (×4): 10 meq via ORAL
  Filled 2021-05-04 (×4): qty 1

## 2021-05-04 MED ORDER — CHLORHEXIDINE GLUCONATE 0.12 % MT SOLN
15.0000 mL | Freq: Two times a day (BID) | OROMUCOSAL | Status: DC
  Administered 2021-05-04 – 2021-05-09 (×10): 15 mL via OROMUCOSAL
  Filled 2021-05-04 (×11): qty 15

## 2021-05-04 MED ORDER — DOXYCYCLINE HYCLATE 100 MG PO TABS
100.0000 mg | ORAL_TABLET | Freq: Two times a day (BID) | ORAL | Status: DC
  Administered 2021-05-04 – 2021-05-09 (×12): 100 mg via ORAL
  Filled 2021-05-04 (×12): qty 1

## 2021-05-04 MED ORDER — ACETAMINOPHEN 325 MG PO TABS: 650.0000 mg | ORAL_TABLET | Freq: Four times a day (QID) | ORAL | Status: DC | PRN

## 2021-05-04 MED ORDER — MOMETASONE FURO-FORMOTEROL FUM 200-5 MCG/ACT IN AERO
2.0000 | INHALATION_SPRAY | Freq: Two times a day (BID) | RESPIRATORY_TRACT | Status: DC
  Administered 2021-05-04 – 2021-05-09 (×11): 2 via RESPIRATORY_TRACT
  Filled 2021-05-04: qty 8.8

## 2021-05-04 MED ORDER — PREDNISONE 50 MG PO TABS
60.0000 mg | ORAL_TABLET | Freq: Every day | ORAL | Status: DC
  Administered 2021-05-04: 60 mg via ORAL
  Filled 2021-05-04: qty 3

## 2021-05-04 NOTE — Progress Notes (Signed)
NEW ADMISSION NOTE New Admission Note:   Arrival Method: Stretcher  Mental Orientation: A&O x4  Telemetry: Box 8 Assessment: Completed Skin: Intact see assessment IV: Right Forearm Pain: denies Tubes: none Safety Measures: Safety Fall Prevention Plan has been given, discussed and signed Admission: Completed [facility] Orientation: Patient has been orientated to the room, unit and staff.  Family: none present patient will notify  Orders have been reviewed and implemented. Will continue to monitor the patient. Call light has been placed within reach and bed alarm has been activated.   Velia Meyer, RN

## 2021-05-04 NOTE — ED Notes (Signed)
Report given to RN Erica in yellow zone.

## 2021-05-04 NOTE — Progress Notes (Addendum)
Kristopher [name] is a 70 y.o. male with medical history significant for CAD, CHF, COPD, OSA, pulmonary hypertension who presents for evaluation of shortness of breath.  He reports that for the last 2 weeks he has had increasing shortness of breath.  In the last 2 days he has had worsening shortness of breath that is exacerbated by any type of exertion.  He reports a cough with a thick yellow phlegm which is different than his normal thin white phlegm.  States he has not had any fever or chills.  He denies any chest pain or palpitations.  He reports has been using his inhalers and nebulizers which provide short-term relief but then the symptoms recur.  He was taking his oxygen levels at home and they were dropping into the low 80s which prompted him to call EMS.  He does use CPAP at night for his OSA but does not use oxygen during the day at home.  Has no known sick contacts. He has a history of smoking but quit over 25 years ago.  Denies alcohol or illicit drug use   ED Course: Has been hemodynamically stable with intermittent brief periods of tachycardia and tachypnea in the emergency room.  X-ray shows no infiltrate or consolidation.  VBG shows pH 7.414 PCO2 53.6 PO2 70 bicarb 34.3.  Sodium 135 potassium 3.1 chloride 95 bicarb 30 creatinine 1.10 BUN 12 glucose 99 alk phos a 79 albumin 3.3 AST 15 ALT 9, BC 16,500 hemoglobin 10.5 hematocrit 31.0 platelets 419,000.  Opyd swab is negative.  Influenza a and B are negative.  Hospitalist service asked to admit for further management.  05/05/2021: Patient seen and examined at his bedside.  Persistent productive cough noted on exam.  Sputum culture, pulmonary toilet ordered.  Rocephin added to broaden coverage, continue doxycycline.  Prolonged QTC greater than 500.  Avoid QTC prolonging agents.  Optimize magnesium and potassium levels.  Repeat 12 EKG in the morning.  He is not on oxygen supplementation at baseline.  He uses a CPAP nightly for OSA.  Please refer to  H&P dated by my partner Dr. Tonie Griffith, on 05/04/2021 for further details of the assessment and plan.

## 2021-05-04 NOTE — Plan of Care (Signed)
°  Problem: Activity: °Goal: Risk for activity intolerance will decrease °Outcome: Progressing °  °Problem: Clinical Measurements: °Goal: Respiratory complications will improve °Outcome: Not Progressing °  °

## 2021-05-04 NOTE — H&P (Signed)
History and Physical    Kristopher [name] VHQ:[id number] DOB: 1950-11-10 DOA: 05/03/2021  PCP: Maurice Small, MD (Inactive)   Patient coming from: Home  Chief Complaint: Shortness of breath  HPI: Kristopher [name] is a 70 y.o. male with medical history significant for CAD, CHF, COPD, OSA, pulmonary hypertension who presents for evaluation of shortness of breath.  He reports that for the last 2 weeks he has had increasing shortness of breath.  In the last 2 days he has had worsening shortness of breath that is exacerbated by any type of exertion.  He reports a cough with a thick yellow phlegm which is different than his normal thin white phlegm.  States he has not had any fever or chills.  He denies any chest pain or palpitations.  He reports has been using his inhalers and nebulizers which provide short-term relief but then the symptoms recur.  He was taking his oxygen levels at home and they were dropping into the low 80s which prompted him to call EMS.  He does use CPAP at night for his OSA but does not use oxygen during the day at home.  Has no known sick contacts. He has a history of smoking but quit over 25 years ago.  Denies alcohol or illicit drug use  ED Course: Has been hemodynamically stable with intermittent brief periods of tachycardia and tachypnea in the emergency room.  X-ray shows no infiltrate or consolidation.  VBG shows pH 7.414 PCO2 53.6 PO2 70 bicarb 34.3.  Sodium 135 potassium 3.1 chloride 95 bicarb 30 creatinine 1.10 BUN 12 glucose 99 alk phos a 79 albumin 3.3 AST 15 ALT 9, BC 16,500 hemoglobin 10.5 hematocrit 31.0 platelets 419,000.  Opyd swab is negative.  Influenza a and B are negative.  Hospitalist service asked to admit for further management  Review of Systems:  General: Denies fever, chills, weight loss, night sweats.  Denies dizziness.  Denies change in appetite HENT: Denies head trauma, headache, denies change in hearing, tinnitus.  Denies nasal congestion or bleeding.  Denies  sore throat, sores in mouth.  Denies difficulty swallowing Eyes: Denies blurry vision, pain in eye, drainage.  Denies discoloration of eyes. Neck: Denies pain.  Denies swelling.  Denies pain with movement. Cardiovascular: Denies chest pain, palpitations.  Reports chronic edema.  Denies orthopnea Respiratory: Reports shortness of breath, cough with yellow phlegm production.  Denies wheezing.   Gastrointestinal: Denies abdominal pain, swelling.  Denies nausea, vomiting, diarrhea.  Denies melena.  Denies hematemesis. Musculoskeletal: Denies limitation of movement.  Denies deformity or swelling.  Denies arthralgias or myalgias. Genitourinary: Denies pelvic pain.  Denies urinary frequency or hesitancy.  Denies dysuria.  Skin: Denies rash.  Denies petechiae, purpura, ecchymosis. Neurological: Denies syncope.  Denies seizure activity.  Denies paresthesia.  Denies slurred speech, drooping face.  Denies visual change. Psychiatric: Denies depression, anxiety.Denies hallucinations.  Past Medical History:  Diagnosis Date   Acute combined systolic and diastolic heart failure ([place]) 08/11/2016   Acute esophagitis 08/17/2016   Acute upper GI bleed 08/10/2016   Asthma    CAD (coronary artery disease) 08/16/2016   CHF (congestive heart failure) ([facility])    COPD (chronic obstructive pulmonary disease) ([place]) 11/06/2019   DCM (dilated cardiomyopathy) ([place])    Dyslipidemia 11/06/2019   Dyspnea    HTN (hypertension)    LBBB (left bundle branch block) 08/10/2016   Lymphadenopathy 08/17/2016   OSA (obstructive sleep apnea) 11/18/2018   [place] home sleep study revealed Severe Obstructive Sleep Apnea with  AHI 65.8/hr. Central sleep apnea was noted with an pAHIc of 11.4/hr. % Cheyne Stokes respirations was 10.3%. Nocturnal Hypoxemia was noted with O2 saturations as low as 72%. Time spent with Oxygen desaturations < 88% was 97.6 minutes.   PUD (peptic ulcer disease) 08/17/2016   Pulmonary HTN ([place]) 08/17/2016    Past Surgical History:   Procedure Laterality Date   BIOPSY  11/07/2019   Procedure: BIOPSY;  Surgeon: Otis Brace, MD;  Location: [place];  Service: Gastroenterology;;   BIOPSY  11/08/2019   Procedure: BIOPSY;  Surgeon: Otis Brace, MD;  Location: [place];  Service: Gastroenterology;;   BIOPSY  02/25/2021   Procedure: BIOPSY;  Surgeon: Otis Brace, MD;  Location: [place];  Service: Gastroenterology;;   COLONOSCOPY WITH PROPOFOL N/A 11/08/2019   Procedure: COLONOSCOPY WITH PROPOFOL;  Surgeon: Otis Brace, MD;  Location: [place];  Service: Gastroenterology;  Laterality: N/A;   ESOPHAGOGASTRODUODENOSCOPY (EGD) WITH PROPOFOL Left 08/11/2016   Procedure: ESOPHAGOGASTRODUODENOSCOPY (EGD) WITH PROPOFOL;  Surgeon: Arta Silence, MD;  Location: [facility] ENDOSCOPY;  Service: Endoscopy;  Laterality: Left;   ESOPHAGOGASTRODUODENOSCOPY (EGD) WITH PROPOFOL Left 11/07/2019   Procedure: ESOPHAGOGASTRODUODENOSCOPY (EGD) WITH PROPOFOL;  Surgeon: Otis Brace, MD;  Location: [facility] ENDOSCOPY;  Service: Gastroenterology;  Laterality: Left;   ESOPHAGOGASTRODUODENOSCOPY (EGD) WITH PROPOFOL N/A 02/25/2021   Procedure: ESOPHAGOGASTRODUODENOSCOPY (EGD) WITH PROPOFOL;  Surgeon: Otis Brace, MD;  Location: [place];  Service: Gastroenterology;  Laterality: N/A;   POLYPECTOMY  11/08/2019   Procedure: POLYPECTOMY;  Surgeon: Otis Brace, MD;  Location: [place] ENDOSCOPY;  Service: Gastroenterology;;   RIGHT/LEFT HEART CATH AND CORONARY ANGIOGRAPHY N/A 08/15/2016   Procedure: Right/Left Heart Cath and Coronary Angiography;  Surgeon: Jettie Booze, MD;  Location: [place] CV LAB;  Service: Cardiovascular;  Laterality: N/A;   RIGHT/LEFT HEART CATH AND CORONARY ANGIOGRAPHY N/A 08/04/2018   Procedure: RIGHT/LEFT HEART CATH AND CORONARY ANGIOGRAPHY;  Surgeon: Jolaine Artist, MD;  Location: [place] CV LAB;  Service: Cardiovascular;  Laterality: N/A;    Social History  reports that he quit  smoking about 22 years ago. His smoking use included cigarettes. He has a 22.00 pack-year smoking history. He has never used smokeless tobacco. He reports that he does not currently use alcohol. He reports that he does not currently use drugs.  Allergies  Allergen Reactions   Penicillins Other (See Comments)    Pt tolerated ceftriaxone. Pt reports it makes him feel shaky Has patient had a PCN reaction causing immediate rash, facial/tongue/throat swelling, SOB or lightheadedness with hypotension: YES Has patient had a PCN reaction causing severe rash involving mucus membranes or skin necrosis: NO Has patient had a PCN reaction that required hospitalization NO Has patient had a PCN reaction occurring within the last 10 years: NO If all of the above answers are "NO", then may proceed with Cephalosporin use.    Family History  Problem Relation Age of Onset   Hypertension Mother    Hypertension Sister      Prior to Admission medications   Medication Sig Start Date End Date Taking? Authorizing Provider  acetaminophen (TYLENOL) 325 MG tablet Take 650 mg by mouth every 6 (six) hours as needed for mild pain.    Yes [provider]  albuterol (PROVENTIL) (2.5 MG/3ML) 0.083% nebulizer solution Inhale 3 mLs into the lungs in the morning and at bedtime. 06/20/18  Yes [provider]  aspirin 81 MG chewable tablet Chew 1 tablet (81 mg total) by mouth daily. 09/15/18  Yes Georgiana Shore, NP  atorvastatin (LIPITOR) 40 MG tablet Take 1 tablet (40 mg total) by mouth daily. Needs appt 03/17/21  Yes Bensimhon, Shaune Pascal, MD  docusate sodium (COLACE) 100 MG capsule Take 1 capsule (100 mg total) by mouth 2 (two) times daily as needed for mild constipation. 03/11/21  Yes Amin, Ankit Chirag, MD  ferrous sulfate 325 (65 FE) MG tablet Take 325 mg by mouth 3 (three) times a week.   Yes [provider]  furosemide (LASIX) 40 MG tablet Take 1 tablet by mouth twice daily Patient taking  differently: Take 80 mg by mouth 2 (two) times daily. 04/27/21  Yes Larey Dresser, MD  magnesium hydroxide (MILK OF MAGNESIA) 400 MG/5ML suspension Take 30 mLs by mouth See admin instructions. Tuesday and thursday   Yes [provider]  pantoprazole (PROTONIX) 40 MG tablet Take 1 tablet (40 mg total) by mouth 2 (two) times daily before a meal. Patient taking differently: Take 40 mg by mouth daily. 02/27/21  Yes Debbe Odea, MD  polyethylene glycol (MIRALAX / GLYCOLAX) 17 g packet Take 17 g by mouth 2 (two) times daily as needed for moderate constipation or severe constipation. 03/11/21  Yes Amin, Ankit Chirag, MD  potassium chloride (KLOR-CON) 10 MEQ tablet Take 1 tablet (10 mEq total) by mouth daily. 06/29/19  Yes Bensimhon, Shaune Pascal, MD  sacubitril-valsartan (ENTRESTO) 49-51 MG Take 1 tablet by mouth 2 (two) times daily. Patient taking differently: Take 1 tablet by mouth daily. 04/28/21  Yes Bensimhon, Shaune Pascal, MD  SYMBICORT 160-4.5 MCG/ACT inhaler Inhale 1 puff into the lungs in the morning and at bedtime. 09/08/19  Yes [provider]  empagliflozin (JARDIANCE) 10 MG TABS tablet Take 1 tablet (10 mg total) by mouth daily. Patient not taking: Reported on 05/03/2021 02/28/21   Debbe Odea, MD  furosemide (LASIX) 80 MG tablet Take 1 tablet (80 mg total) by mouth daily. Patient not taking: Reported on 05/03/2021 02/28/21   Debbe Odea, MD  ipratropium (ATROVENT) 0.02 % nebulizer solution Take 2.5 mLs (0.5 mg total) by nebulization 2 (two) times daily. Patient not taking: Reported on 05/03/2021 08/08/18   Elodia Florence., MD    Physical Exam: Vitals:   05/03/21 2145 05/03/21 2200 05/03/21 2215 05/03/21 2230  BP: (!) 178/59 127/76 125/65 138/70  Pulse: (!) 109 (!) 105 91 99  Resp: (!) _0 Temp:      TempSrc:      SpO2: 92% 93% 95% 94%  Weight:      Height:        Constitutional: NAD, calm, comfortable Vitals:   05/03/21 2145 05/03/21 2200 05/03/21  2215 05/03/21 2230  BP: (!) 178/59 127/76 125/65 138/70  Pulse: (!) 109 (!) 105 91 99  Resp: (!) _1 Temp:      TempSrc:      SpO2: 92% 93% 95% 94%  Weight:      Height:       General: WDWN, Alert and oriented x3.  Eyes: EOMI, PERRL, conjunctivae normal.  Sclera nonicteric HENT:  [place]/AT, external ears normal.  Nares patent without epistasis.  Mucous membranes are moist.  Neck: Soft, normal range of motion, supple, no masses, no thyromegaly. Trachea midline Respiratory: Diminished breath sounds bilaterally.  Diffuse expiratory wheezing, diffuse Rales.  No crackles. Normal respiratory effort. No accessory muscle use.  Cardiovascular: Regular rate and rhythm, no murmurs / rubs / gallops. Mild lower extremity edema. 1+ pedal pulses.  Abdomen: Soft, no  tenderness, nondistended, no rebound or guarding.  No masses palpated. Obese. Bowel sounds normoactive Musculoskeletal: FROM. no cyanosis. No joint deformity upper and lower extremities. Normal muscle tone.  Skin: Warm, dry, intact no rashes, lesions, ulcers. No induration Neurologic: CN 2-12 grossly intact.  Normal speech.  Sensation intact, Strength 5/5 in all extremities.   Psychiatric: Normal judgment and insight.  Normal mood.    Labs on Admission: I have personally reviewed following labs and imaging studies  CBC: Recent Labs  Lab 05/03/21 1952 05/03/21 2005  WBC 16.5*  --   NEUTROABS 13.0*  --   HGB 8.6* 10.5*  HCT 30.8* 31.0*  MCV 81.7  --   PLT 419*  --     Basic Metabolic Panel: Recent Labs  Lab 05/03/21 1952 05/03/21 2005  NA 135 136  K 3.1* 3.1*  CL 95*  --   CO2 30  --   GLUCOSE 99  --   BUN 12  --   CREATININE 1.10  --   CALCIUM 8.7*  --     GFR: Estimated Creatinine Clearance: 70.8 mL/min (by C-G formula based on SCr of 1.1 mg/dL).  Liver Function Tests: Recent Labs  Lab 05/03/21 1952  AST 15  ALT 9  ALKPHOS 79  BILITOT 0.6  PROT 7.0  ALBUMIN 3.3*    Urine analysis:    Component  Value Date/Time   COLORURINE YELLOW 03/03/2021 1906   APPEARANCEUR CLEAR 03/03/2021 1906   LABSPEC 1.014 03/03/2021 1906   PHURINE 6.0 03/03/2021 1906   GLUCOSEU >=500 (A) 03/03/2021 1906   HGBUR NEGATIVE 03/03/2021 1906   BILIRUBINUR NEGATIVE 03/03/2021 1906   KETONESUR NEGATIVE 03/03/2021 1906   PROTEINUR NEGATIVE 03/03/2021 1906   NITRITE NEGATIVE 03/03/2021 1906   LEUKOCYTESUR NEGATIVE 03/03/2021 1906    Radiological Exams on Admission: DG Chest 2 View  Result Date: 05/03/2021 CLINICAL DATA:  Cough, shortness of breath EXAM: CHEST - 2 VIEW COMPARISON:  02/24/2021 FINDINGS: Stable cardiomediastinal contours. Low lung volumes with mild bibasilar atelectasis. No new focal airspace consolidation. No pleural effusion or pneumothorax. IMPRESSION: Low lung volumes with mild bibasilar atelectasis. Electronically Signed   By: Davina Poke D.O.   On: 05/03/2021 20:25    EKG: Independently reviewed.  EKG shows Normal sinus rhythm with left bundle branch block.  No acute ST elevation or depression.  QTc prolonged at 521  Assessment/Plan Principal Problem:   COPD exacerbation Kristopher [name] is admitted to med/surg floor Continue ICS/LABA therapy with [facility] which is substituted for Symbicort due to formulary.  Use with AeroChamber. DuoNebs every 4 hours as needed for shortness of breath, cough, wheeze Prednisone 60 mg p.o. daily for the next 3 days.  Patient was given dose of Solu-Medrol in the emergency room Doxycycline 100 mg twice daily for COPD exacerbation requiring hospitalization Supplemental oxygen to keep O2 sat between 92 to 96% Incentive spirometer every 2 hours while awake  Active Problems:   Hypoxemia Supplemental oxygen as above. Check D-dimer level and if elevated will obtain CT angiography to rule out PE    Chronic combined systolic and diastolic heart failure  Chronic.  No signs of volume overload at this time.  Continue routine home medications    HTN  (hypertension) Continue home medications.  Monitor blood pressure    OSA (obstructive sleep apnea) CPAP at night.    Prolonged QT interval Avoid medications which could further prolong QT interval    DVT prophylaxis: Lovenox for DVT prophylaxis Code Status:   Full  code Family Communication:  Diagnosis and plan discussed with patient.  He verbalized understanding agrees with plan.  Further recommendations to follow as clinically indicated Disposition Plan:   Patient is from:  Home  Anticipated DC to:  Home  Anticipated DC date:  Anticipate 2 midnight or more stay in the hospital  Admission status:  Inpatient   Yevonne Aline Sevannah Madia MD [facility]  How to contact the [facility] Attending or Consulting provider [place] or covering provider during after hours [place], for this patient?   Check the care team in [facility] and look for a) attending/consulting [facility] provider listed and b) the [facility] team listed Log into [email or web address] and use [place]'s universal password to access. If you do not have the password, please contact the hospital operator. Locate the [facility] provider you are looking for under [facility] and page to a number that you can be directly reached. If you still have difficulty reaching the provider, please page the [facility] (Director on Call) for the Hospitalists listed on amion for assistance.  05/04/2021, 12:07 AM

## 2021-05-05 DIAGNOSIS — R9431 Abnormal electrocardiogram [ECG] [EKG]: Secondary | ICD-10-CM

## 2021-05-05 DIAGNOSIS — G4733 Obstructive sleep apnea (adult) (pediatric): Secondary | ICD-10-CM | POA: Diagnosis not present

## 2021-05-05 DIAGNOSIS — I5032 Chronic diastolic (congestive) heart failure: Secondary | ICD-10-CM

## 2021-05-05 DIAGNOSIS — I10 Essential (primary) hypertension: Secondary | ICD-10-CM

## 2021-05-05 DIAGNOSIS — J9601 Acute respiratory failure with hypoxia: Secondary | ICD-10-CM

## 2021-05-05 DIAGNOSIS — J441 Chronic obstructive pulmonary disease with (acute) exacerbation: Secondary | ICD-10-CM | POA: Diagnosis not present

## 2021-05-05 LAB — BASIC METABOLIC PANEL
Anion gap: 7 (ref 5–15)
BUN: 18 mg/dL (ref 8–23)
CO2: 35 mmol/L — ABNORMAL HIGH (ref 22–32)
Calcium: 9.1 mg/dL (ref 8.9–10.3)
Chloride: 95 mmol/L — ABNORMAL LOW (ref 98–111)
Creatinine, Ser: 1.1 mg/dL (ref 0.61–1.24)
GFR, Estimated: >60 mL/min (ref >60)
Glucose, Bld: 141 mg/dL — ABNORMAL HIGH (ref 70–99)
Potassium: 4.3 mmol/L (ref 3.5–5.1)
Sodium: 137 mmol/L (ref 135–145)

## 2021-05-05 LAB — CBC
HCT: 30.8 % — ABNORMAL LOW (ref 39.0–52.0)
Hemoglobin: 8.8 g/dL — ABNORMAL LOW (ref 13.0–17.0)
MCH: 23 pg — ABNORMAL LOW (ref 26.0–34.0)
MCHC: 28.6 g/dL — ABNORMAL LOW (ref 30.0–36.0)
MCV: 80.6 fL (ref 80.0–100.0)
Platelets: 456 10*3/uL — ABNORMAL HIGH (ref 150–400)
RBC: 3.82 MIL/uL — ABNORMAL LOW (ref 4.22–5.81)
RDW: 18.1 % — ABNORMAL HIGH (ref 11.5–15.5)
WBC: 18.1 10*3/uL — ABNORMAL HIGH (ref 4.0–10.5)
nRBC: 0.1 % (ref 0.0–0.2)

## 2021-05-05 LAB — MAGNESIUM: Magnesium: 2.7 mg/dL — ABNORMAL HIGH (ref 1.7–2.4)

## 2021-05-05 LAB — PHOSPHORUS: Phosphorus: 4.5 mg/dL (ref 2.5–4.6)

## 2021-05-05 MED ORDER — ALBUTEROL SULFATE (2.5 MG/3ML) 0.083% IN NEBU: 2.5000 mg | INHALATION_SOLUTION | RESPIRATORY_TRACT | Status: DC | PRN

## 2021-05-05 MED ORDER — IPRATROPIUM-ALBUTEROL 0.5-2.5 (3) MG/3ML IN SOLN
3.0000 mL | Freq: Four times a day (QID) | RESPIRATORY_TRACT | Status: DC
Start: 2021-05-05 — End: 2021-05-06
  Administered 2021-05-05 – 2021-05-06 (×3): 3 mL via RESPIRATORY_TRACT
  Filled 2021-05-05 (×3): qty 3

## 2021-05-05 NOTE — Progress Notes (Addendum)
PROGRESS NOTE    Kristopher [name]  UVO:[id number] DOB: October 01, 1950 DOA: 05/03/2021 PCP: Shirlean Mylar, MD (Inactive)    Chief Complaint  Patient presents with   Shortness of Breath    Brief Narrative:  Kristopher [name] is a 70 y.o. male with medical history significant for CAD, CHF, COPD, OSA, pulmonary hypertension who presents for evaluation of shortness of breath.  He reports that for the last 2 weeks he has had increasing shortness of breath. In the last 2 days he has had worsening shortness of breath that is exacerbated by any type of exertion.  He reports a cough with a thick yellow phlegm which is different than his normal thin white phlegm.  States he has not had any fever or chills.  He denies any chest pain or palpitations.  He reports has been using his inhalers and nebulizers which provide short-term relief but then the symptoms recur.  He was taking his oxygen levels at home and they were dropping into the low 80s which prompted him to call EMS.   SUBJECTIVE: Overnight, no acute events. Reports his dyspnea has significantly improved and he is continuing to expectorate.  Assessment & Plan:   Principal Problem:   COPD exacerbation (HCC) Active Problems:   Chronic combined systolic and diastolic heart failure (HCC)   HTN (hypertension)   OSA (obstructive sleep apnea)   Hypoxemia   Prolonged QT interval  COPD Exacerbation Acute respiratory failure w/hypoxia - c/w doxycycline, ok to continue ceftriaxone but low suspicion for PNA - c/w duoneb q6h + q2hprn albuterol nebs - c/w steroids for 5 day course - guaifenesin - c/w dulera BID - wean O2 as needed  Chronic Diastolic heart failure w/recovered EF - EF 11/2018 20-25%, now recovered to NL 02/2021 TTE - c/w Entresto BID - c/w furosemide 40mg  BID - c/w empagliflozin 10mg  daily  HTN - c/w Entresto  OSA - c/w CPAP QHS  Prolonged QT - avoid qt prolonging meds - s/p Mg supp - repeat EKG prn   DVT prophylaxis:  Lovenox Code Status: full Family Communication: patient communicating plan w/daughters Disposition:  Status is: Inpatient Remains inpatient appropriate because: O2 req, medical necessity   Consultants:  N/A  Procedures:  N/a  Antimicrobials:  Anti-infectives (From admission, onward)    Start     Dose/Rate Route Frequency Ordered Stop   05/04/21 1800  cefTRIAXone (ROCEPHIN) 1 g in sodium chloride 0.9 % 100 mL IVPB        1 g 200 mL/hr over 30 Minutes Intravenous Every 24 hours 05/04/21 1641 05/07/21 1759   05/04/21 0030  doxycycline (VIBRA-TABS) tablet 100 mg        100 mg Oral Every 12 hours 05/04/21 0017             Objective: Vitals:   05/05/21 0900 05/05/21 0916 05/05/21 1446 05/05/21 1707  BP:  124/68  123/74  Pulse:  93  76  Resp:  16  16  Temp:  98.2 F (36.8 C)  97.6 F (36.4 C)  TempSrc:  Oral  Oral  SpO2: 98% 95% 100% 100%  Weight:      Height:        Intake/Output Summary (Last 24 hours) at 05/05/2021 1730 Last data filed at 05/05/2021 1300 Gross per 24 hour  Intake 817 ml  Output 1000 ml  Net -183 ml   Filed Weights   05/03/21 2040 05/04/21 1305  Weight: 101.2 kg 99.5 kg    Examination:  General  exam: Appears calm and comfortable  Respiratory system: +wheezes, rhonchi, labored breathing Cardiovascular system: S1 & S2 heard, RRR. No murmurs, rubs, gallops or clicks. No pedal edema. Gastrointestinal system: Abdomen is nondistended, soft and nontender. No organomegaly or masses felt. Normal bowel sounds heard. Neuro: Alert and oriented. No focal neurological deficits. Extremities: Symmetric, normal muscle tone/bulk Skin: No rashes, lesions or ulcers Psychiatry: Judgement and insight appear normal. Mood & affect appropriate.    Data Reviewed: I have personally reviewed following labs and imaging studies  CBC: Recent Labs  Lab 05/03/21 1952 05/03/21 2005 05/04/21 0337 05/05/21 0324  WBC 16.5*  --  16.7* 18.1*  NEUTROABS 13.0*  --   --    --   HGB 8.6* 10.5* 9.0* 8.8*  HCT 30.8* 31.0* 31.9* 30.8*  MCV 81.7  --  80.8 80.6  PLT 419*  --  461* 456*    Basic Metabolic Panel: Recent Labs  Lab 05/03/21 1952 05/03/21 2005 05/04/21 0337 05/05/21 0324  NA 135 136 133* 137  K 3.1* 3.1* 3.6 4.3  CL 95*  --  94* 95*  CO2 30  --  30 35*  GLUCOSE 99  --  142* 141*  BUN 12  --  13 18  CREATININE 1.10  --  1.03 1.10  CALCIUM 8.7*  --  8.9 9.1  MG  --   --   --  2.7*  PHOS  --   --   --  4.5    GFR: Estimated Creatinine Clearance: 70.3 mL/min (by C-G formula based on SCr of 1.1 mg/dL).  Liver Function Tests: Recent Labs  Lab 05/03/21 1952  AST 15  ALT 9  ALKPHOS 79  BILITOT 0.6  PROT 7.0  ALBUMIN 3.3*    CBG: No results for input(s): GLUCAP in the last 168 hours.   Recent Results (from the past 240 hour(s))  Resp Panel by RT-PCR (Flu A&B, Covid) Nasopharyngeal Swab     Status: None   Collection Time: 05/03/21  6:50 PM   Specimen: Nasopharyngeal Swab; Nasopharyngeal(NP) swabs in vial transport medium  Result Value Ref Range Status   SARS Coronavirus 2 by RT PCR NEGATIVE NEGATIVE Final    Comment: (NOTE) SARS-CoV-2 target nucleic acids are NOT DETECTED.  The SARS-CoV-2 RNA is generally detectable in upper respiratory specimens during the acute phase of infection. The lowest concentration of SARS-CoV-2 viral copies this assay can detect is 138 copies/mL. A negative result does not preclude SARS-Cov-2 infection and should not be used as the sole basis for treatment or other patient management decisions. A negative result may occur with  improper specimen collection/handling, submission of specimen other than nasopharyngeal swab, presence of viral mutation(s) within the areas targeted by this assay, and inadequate number of viral copies(<138 copies/mL). A negative result must be combined with clinical observations, patient history, and epidemiological information. The expected result is Negative.  Fact  Sheet for Patients:  EntrepreneurPulse.com.au  Fact Sheet for Healthcare Providers:  IncredibleEmployment.be  This test is no t yet approved or cleared by the [place] FDA and  has been authorized for detection and/or diagnosis of SARS-CoV-2 by FDA under an Emergency Use Authorization (EUA). This EUA will remain  in effect (meaning this test can be used) for the duration of the COVID-19 declaration under Section 564(b)(1) of the Act, 21 U.S.C.section 360bbb-3(b)(1), unless the authorization is terminated  or revoked sooner.       Influenza A by PCR NEGATIVE NEGATIVE Final   Influenza B  by PCR NEGATIVE NEGATIVE Final    Comment: (NOTE) The Xpert Xpress SARS-CoV-2/FLU/RSV plus assay is intended as an aid in the diagnosis of influenza from Nasopharyngeal swab specimens and should not be used as a sole basis for treatment. Nasal washings and aspirates are unacceptable for Xpert Xpress SARS-CoV-2/FLU/RSV testing.  Fact Sheet for Patients: EntrepreneurPulse.com.au  Fact Sheet for Healthcare Providers: IncredibleEmployment.be  This test is not yet approved or cleared by the [place] FDA and has been authorized for detection and/or diagnosis of SARS-CoV-2 by FDA under an Emergency Use Authorization (EUA). This EUA will remain in effect (meaning this test can be used) for the duration of the COVID-19 declaration under Section 564(b)(1) of the Act, 21 U.S.C. section 360bbb-3(b)(1), unless the authorization is terminated or revoked.  Performed at [facility], [place] [address] [place], [place] [address]   Expectorated Sputum Assessment w Gram Stain, Rflx to Resp Cult     Status: None   Collection Time: 05/04/21  5:11 PM   Specimen: Expectorated Sputum  Result Value Ref Range Status   Specimen Description EXPECTORATED SPUTUM  Final   Special Requests NONE  Final   Sputum evaluation   Final     THIS SPECIMEN IS ACCEPTABLE FOR SPUTUM CULTURE Performed at [facility], [place] [address], [place], [place] [address]    Report Status 05/04/2021 FINAL  Final  Culture, Respiratory w Gram Stain     Status: None (Preliminary result)   Collection Time: 05/04/21  5:11 PM  Result Value Ref Range Status   Specimen Description EXPECTORATED SPUTUM  Final   Special Requests NONE Reflexed from [id number]  Final   Gram Stain   Final    RARE SQUAMOUS EPITHELIAL CELLS PRESENT MODERATE WBC PRESENT, PREDOMINANTLY PMN MODERATE GRAM POSITIVE COCCI FEW GRAM POSITIVE RODS    Culture   Final    CULTURE REINCUBATED FOR BETTER GROWTH Performed at [facility], [place] [address] [place], [place] [address]    Report Status PENDING  Incomplete      Radiology Studies: DG Chest 2 View  Result Date: 05/03/2021 CLINICAL DATA:  Cough, shortness of breath EXAM: CHEST - 2 VIEW COMPARISON:  02/24/2021 FINDINGS: Stable cardiomediastinal contours. Low lung volumes with mild bibasilar atelectasis. No new focal airspace consolidation. No pleural effusion or pneumothorax. IMPRESSION: Low lung volumes with mild bibasilar atelectasis. Electronically Signed   By: Davina Poke D.O.   On: 05/03/2021 20:25      Scheduled Meds:  aspirin  81 mg Oral Daily   atorvastatin  40 mg Oral Daily   chlorhexidine  15 mL Mouth Rinse BID   doxycycline  100 mg Oral Q12H   empagliflozin  10 mg Oral Daily   enoxaparin (LOVENOX) injection  50 mg Subcutaneous Q24H   furosemide  40 mg Oral BID   guaiFENesin  1,200 mg Oral BID   ipratropium-albuterol  3 mL Nebulization Q6H   mouth rinse  15 mL Mouth Rinse q12n4p   methylPREDNISolone (SOLU-MEDROL) injection  40 mg Intravenous Q12H   mometasone-formoterol  2 puff Inhalation BID   potassium chloride  10 mEq Oral Daily   sacubitril-valsartan  1 tablet Oral BID   Continuous Infusions:  cefTRIAXone (ROCEPHIN)  IV Stopped (05/04/21 1755)     LOS: 2 days    Cecille Rubin,  MD [facility]   To contact the attending provider between 7A-7P or the covering provider during after hours 7P-7A, please log into the web site [email or web address]  and access using universal [place] password for that web site. If you do not have the password, please call the hospital operator.  05/05/2021, 5:30 PM

## 2021-05-06 DIAGNOSIS — J441 Chronic obstructive pulmonary disease with (acute) exacerbation: Secondary | ICD-10-CM | POA: Diagnosis not present

## 2021-05-06 LAB — CULTURE, RESPIRATORY W GRAM STAIN: Culture: NORMAL

## 2021-05-06 MED ORDER — FLUTICASONE PROPIONATE 50 MCG/ACT NA SUSP
2.0000 | Freq: Every day | NASAL | Status: AC
  Administered 2021-05-06 – 2021-05-08 (×3): 2 via NASAL
  Filled 2021-05-06: qty 16

## 2021-05-06 MED ORDER — IPRATROPIUM-ALBUTEROL 0.5-2.5 (3) MG/3ML IN SOLN
3.0000 mL | Freq: Two times a day (BID) | RESPIRATORY_TRACT | Status: DC
  Administered 2021-05-06 – 2021-05-09 (×6): 3 mL via RESPIRATORY_TRACT
  Filled 2021-05-06 (×6): qty 3

## 2021-05-06 NOTE — Progress Notes (Signed)
PROGRESS NOTE    Kristopher [name]  [id number] DOB: June 18, 1950 DOA: 05/03/2021 PCP: Maurice Small, MD (Inactive)    Chief Complaint  Patient presents with   Shortness of Breath    Brief Narrative:  Kristopher [name] is a 70 y.o. male with medical history significant for CAD, CHF, COPD, OSA, pulmonary hypertension who presents for evaluation of shortness of breath.  He reports that for the last 2 weeks he has had increasing shortness of breath. In the last 2 days he has had worsening shortness of breath that is exacerbated by any type of exertion.  He reports a cough with a thick yellow phlegm which is different than his normal thin white phlegm.  States he has not had any fever or chills.  He denies any chest pain or palpitations.  He reports has been using his inhalers and nebulizers which provide short-term relief but then the symptoms recur.  He was taking his oxygen levels at home and they were dropping into the low 80s which prompted him to call EMS.   SUBJECTIVE: 05/06/2021: Patient was seen and examined at his bedside.  Reports dyspnea with minimal exertion.  He is not on oxygen supplementation at baseline.  Currently on 2 L to maintain a saturation greater than 92%.  Also reports persistent productive cough.  Denies any chest pain.  Assessment & Plan:   Principal Problem:   COPD exacerbation ([place]) Active Problems:   Chronic combined systolic and diastolic heart failure (HCC)   HTN (hypertension)   OSA (obstructive sleep apnea)   Hypoxemia   Prolonged QT interval  COPD Exacerbation Acute respiratory failure w/hypoxia likely secondary to COPD exacerbation. Continue Rocephin and doxycycline.   Continue IV Solu-Medrol 40 mg twice daily  Continue bronchodilators and antitussives as needed.   Wean off oxygen supplementation as tolerated Home oxygen evaluation for DC planning   Chronic Diastolic heart failure w/recovered EF Last 2D echo done on 02/22/2021 showed LVEF 70 to 75% with  no regional wall motion abnormalities.  Biatrial dilatation, moderately. Continue Entresto BID Continue furosemide 40mg  BID Continue empagliflozin 10mg  daily  HTN BP is at goal Continue Entresto  OSA Continue CPAP QHS  Prolonged QT - avoid qt prolonging meds - s/p Mg supp - repeat EKG prn   DVT prophylaxis: Lovenox subcu daily Code Status: full Family Communication: patient communicating plan w/daughters Disposition:  Status is: Inpatient Remains inpatient appropriate because: O2 req, medical necessity   Consultants:  N/A  Procedures:  N/a  Antimicrobials:  Anti-infectives (From admission, onward)    Start     Dose/Rate Route Frequency Ordered Stop   05/04/21 1800  cefTRIAXone (ROCEPHIN) 1 g in sodium chloride 0.9 % 100 mL IVPB        1 g 200 mL/hr over 30 Minutes Intravenous Every 24 hours 05/04/21 1641 05/07/21 1759   05/04/21 0030  doxycycline (VIBRA-TABS) tablet 100 mg        100 mg Oral Every 12 hours 05/04/21 0017             Objective: Vitals:   05/05/21 2059 05/06/21 0552 05/06/21 0828 05/06/21 0906  BP: (!) 137/54 131/84  132/65  Pulse: 86 78  87  Resp:  16  19  Temp: 97.7 F (36.5 C) 97.6 F (36.4 C)  98.2 F (36.8 C)  TempSrc:  Oral    SpO2: 98% (!) 89% 96% 92%  Weight:      Height:        Intake/Output Summary (  Last 24 hours) at 05/06/2021 1356 Last data filed at 05/06/2021 1300 Gross per 24 hour  Intake 1174 ml  Output 625 ml  Net 549 ml   Filed Weights   05/03/21 2040 05/04/21 1305  Weight: 101.2 kg 99.5 kg    Examination:  General exam: Well-developed well-nourished in no acute distress.  Mild conversational dyspnea noted. Respiratory system: Diffuse wheezing bilaterally.  Mild rales at bases.  Good inspiratory effort.   Cardiovascular system: Regular rate and rhythm no rubs or gallops.  No JVD or thyromegaly noted. Gastrointestinal system: Obese nontender normal bowel sounds present.   Neuro: Alert and oriented.  Nonfocal  exam. Extremities: Trace lower extremity edema. Skin: No rashes or ulcerative lesions noted. Psychiatry: Judgment and insight appear normal.  Mood is appropriate for condition and setting.  Data Reviewed: I have personally reviewed following labs and imaging studies  CBC: Recent Labs  Lab 05/03/21 1952 05/03/21 2005 05/04/21 0337 05/05/21 0324  WBC 16.5*  --  16.7* 18.1*  NEUTROABS 13.0*  --   --   --   HGB 8.6* 10.5* 9.0* 8.8*  HCT 30.8* 31.0* 31.9* 30.8*  MCV 81.7  --  80.8 80.6  PLT 419*  --  461* 456*    Basic Metabolic Panel: Recent Labs  Lab 05/03/21 1952 05/03/21 2005 05/04/21 0337 05/05/21 0324  NA 135 136 133* 137  K 3.1* 3.1* 3.6 4.3  CL 95*  --  94* 95*  CO2 30  --  30 35*  GLUCOSE 99  --  142* 141*  BUN 12  --  13 18  CREATININE 1.10  --  1.03 1.10  CALCIUM 8.7*  --  8.9 9.1  MG  --   --   --  2.7*  PHOS  --   --   --  4.5    GFR: Estimated Creatinine Clearance: 70.3 mL/min (by C-G formula based on SCr of 1.1 mg/dL).  Liver Function Tests: Recent Labs  Lab 05/03/21 1952  AST 15  ALT 9  ALKPHOS 79  BILITOT 0.6  PROT 7.0  ALBUMIN 3.3*    CBG: No results for input(s): GLUCAP in the last 168 hours.   Recent Results (from the past 240 hour(s))  Resp Panel by RT-PCR (Flu A&B, Covid) Nasopharyngeal Swab     Status: None   Collection Time: 05/03/21  6:50 PM   Specimen: Nasopharyngeal Swab; Nasopharyngeal(NP) swabs in vial transport medium  Result Value Ref Range Status   SARS Coronavirus 2 by RT PCR NEGATIVE NEGATIVE Final    Comment: (NOTE) SARS-CoV-2 target nucleic acids are NOT DETECTED.  The SARS-CoV-2 RNA is generally detectable in upper respiratory specimens during the acute phase of infection. The lowest concentration of SARS-CoV-2 viral copies this assay can detect is 138 copies/mL. A negative result does not preclude SARS-Cov-2 infection and should not be used as the sole basis for treatment or other patient management decisions.  A negative result may occur with  improper specimen collection/handling, submission of specimen other than nasopharyngeal swab, presence of viral mutation(s) within the areas targeted by this assay, and inadequate number of viral copies(<138 copies/mL). A negative result must be combined with clinical observations, patient history, and epidemiological information. The expected result is Negative.  Fact Sheet for Patients:  BloggerCourse.com  Fact Sheet for Healthcare Providers:  SeriousBroker.it  This test is no t yet approved or cleared by the [place] FDA and  has been authorized for detection and/or diagnosis of SARS-CoV-2 by FDA  under an Emergency Use Authorization (EUA). This EUA will remain  in effect (meaning this test can be used) for the duration of the COVID-19 declaration under Section 564(b)(1) of the Act, 21 U.S.C.section 360bbb-3(b)(1), unless the authorization is terminated  or revoked sooner.       Influenza A by PCR NEGATIVE NEGATIVE Final   Influenza B by PCR NEGATIVE NEGATIVE Final    Comment: (NOTE) The Xpert Xpress SARS-CoV-2/FLU/RSV plus assay is intended as an aid in the diagnosis of influenza from Nasopharyngeal swab specimens and should not be used as a sole basis for treatment. Nasal washings and aspirates are unacceptable for Xpert Xpress SARS-CoV-2/FLU/RSV testing.  Fact Sheet for Patients: EntrepreneurPulse.com.au  Fact Sheet for Healthcare Providers: IncredibleEmployment.be  This test is not yet approved or cleared by the [place] FDA and has been authorized for detection and/or diagnosis of SARS-CoV-2 by FDA under an Emergency Use Authorization (EUA). This EUA will remain in effect (meaning this test can be used) for the duration of the COVID-19 declaration under Section 564(b)(1) of the Act, 21 U.S.C. section 360bbb-3(b)(1), unless the authorization  is terminated or revoked.  Performed at [facility], [place] [address], [place], [place] [address]   Expectorated Sputum Assessment w Gram Stain, Rflx to Resp Cult     Status: None   Collection Time: 05/04/21  5:11 PM   Specimen: Expectorated Sputum  Result Value Ref Range Status   Specimen Description EXPECTORATED SPUTUM  Final   Special Requests NONE  Final   Sputum evaluation   Final    THIS SPECIMEN IS ACCEPTABLE FOR SPUTUM CULTURE Performed at [facility], [place] [address] [place], [place] [address]    Report Status 05/04/2021 FINAL  Final  Culture, Respiratory w Gram Stain     Status: None   Collection Time: 05/04/21  5:11 PM  Result Value Ref Range Status   Specimen Description EXPECTORATED SPUTUM  Final   Special Requests NONE Reflexed from [id number]  Final   Gram Stain   Final    RARE SQUAMOUS EPITHELIAL CELLS PRESENT MODERATE WBC PRESENT, PREDOMINANTLY PMN MODERATE GRAM POSITIVE COCCI FEW GRAM POSITIVE RODS    Culture   Final    RARE Normal respiratory flora-no Staph aureus or Pseudomonas seen Performed at [facility], [place] [address] [place], [place] [address]    Report Status 05/06/2021 FINAL  Final      Radiology Studies: No results found.    Scheduled Meds:  aspirin  81 mg Oral Daily   atorvastatin  40 mg Oral Daily   chlorhexidine  15 mL Mouth Rinse BID   doxycycline  100 mg Oral Q12H   empagliflozin  10 mg Oral Daily   enoxaparin (LOVENOX) injection  50 mg Subcutaneous Q24H   furosemide  40 mg Oral BID   ipratropium-albuterol  3 mL Nebulization BID   mouth rinse  15 mL Mouth Rinse q12n4p   methylPREDNISolone (SOLU-MEDROL) injection  40 mg Intravenous Q12H   mometasone-formoterol  2 puff Inhalation BID   potassium chloride  10 mEq Oral Daily   sacubitril-valsartan  1 tablet Oral BID   Continuous Infusions:  cefTRIAXone (ROCEPHIN)  IV 1 g (05/05/21 1735)     LOS: 3 days    Kayleen Memos, MD [facility]   To contact  the attending provider between 7A-7P or the covering provider during after hours 7P-7A, please log into the web site [email or web address] and access using universal [place] password  for that web site. If you do not have the password, please call the hospital operator.  05/06/2021, 1:56 PM

## 2021-05-07 DIAGNOSIS — J441 Chronic obstructive pulmonary disease with (acute) exacerbation: Secondary | ICD-10-CM | POA: Diagnosis not present

## 2021-05-07 LAB — BASIC METABOLIC PANEL
Anion gap: 11 (ref 5–15)
BUN: 15 mg/dL (ref 8–23)
CO2: 29 mmol/L (ref 22–32)
Calcium: 9.2 mg/dL (ref 8.9–10.3)
Chloride: 95 mmol/L — ABNORMAL LOW (ref 98–111)
Creatinine, Ser: 0.95 mg/dL (ref 0.61–1.24)
GFR, Estimated: >60 mL/min (ref >60)
Glucose, Bld: 110 mg/dL — ABNORMAL HIGH (ref 70–99)
Potassium: 4.1 mmol/L (ref 3.5–5.1)
Sodium: 135 mmol/L (ref 135–145)

## 2021-05-07 LAB — CBC
HCT: 33 % — ABNORMAL LOW (ref 39.0–52.0)
Hemoglobin: 9.5 g/dL — ABNORMAL LOW (ref 13.0–17.0)
MCH: 22.8 pg — ABNORMAL LOW (ref 26.0–34.0)
MCHC: 28.8 g/dL — ABNORMAL LOW (ref 30.0–36.0)
MCV: 79.3 fL — ABNORMAL LOW (ref 80.0–100.0)
Platelets: 494 10*3/uL — ABNORMAL HIGH (ref 150–400)
RBC: 4.16 MIL/uL — ABNORMAL LOW (ref 4.22–5.81)
RDW: 17.8 % — ABNORMAL HIGH (ref 11.5–15.5)
WBC: 18.7 10*3/uL — ABNORMAL HIGH (ref 4.0–10.5)
nRBC: 0.2 % (ref 0.0–0.2)

## 2021-05-07 LAB — PROCALCITONIN: Procalcitonin: <0.1 ng/mL

## 2021-05-07 LAB — PHOSPHORUS: Phosphorus: 3.7 mg/dL (ref 2.5–4.6)

## 2021-05-07 LAB — MAGNESIUM: Magnesium: 2.6 mg/dL — ABNORMAL HIGH (ref 1.7–2.4)

## 2021-05-07 MED ORDER — FUROSEMIDE 10 MG/ML IJ SOLN
20.0000 mg | Freq: Two times a day (BID) | INTRAMUSCULAR | Status: AC
  Administered 2021-05-07 (×2): 20 mg via INTRAVENOUS
  Filled 2021-05-07 (×2): qty 2

## 2021-05-07 MED ORDER — FUROSEMIDE 10 MG/ML IJ SOLN: 20.0000 mg | Freq: Once | INTRAMUSCULAR | Status: DC

## 2021-05-07 MED ORDER — GUAIFENESIN ER 600 MG PO TB12
1200.0000 mg | ORAL_TABLET | Freq: Two times a day (BID) | ORAL | Status: DC
  Administered 2021-05-07 – 2021-05-09 (×5): 1200 mg via ORAL
  Filled 2021-05-07 (×5): qty 2

## 2021-05-07 MED ORDER — SODIUM CHLORIDE 3 % IN NEBU
4.0000 mL | INHALATION_SOLUTION | Freq: Two times a day (BID) | RESPIRATORY_TRACT | Status: DC
  Administered 2021-05-07 – 2021-05-09 (×3): 4 mL via RESPIRATORY_TRACT
  Filled 2021-05-07 (×5): qty 4

## 2021-05-07 MED ORDER — METHYLPREDNISOLONE SODIUM SUCC 125 MG IJ SOLR
80.0000 mg | Freq: Every day | INTRAMUSCULAR | Status: AC
  Administered 2021-05-07 – 2021-05-08 (×2): 80 mg via INTRAVENOUS
  Filled 2021-05-07 (×2): qty 2

## 2021-05-07 MED ORDER — SODIUM CHLORIDE 0.9 % IV SOLN
1.0000 g | INTRAVENOUS | Status: AC
  Administered 2021-05-07 – 2021-05-08 (×2): 1 g via INTRAVENOUS
  Filled 2021-05-07 (×2): qty 10

## 2021-05-07 NOTE — Evaluation (Signed)
Physical Therapy Evaluation and Discharge Patient Details Name: Kristopher [name] MRN: [id number] DOB: [birth year] Today's Date: 05/07/2021  History of Present Illness  70 y.o. male who presented 05/04/21 for evaluation of shortness of breath. +COPD exacerbation   PMH significant for CAD, CHF, COPD, OSA, pulmonary hypertension  Clinical Impression   Patient evaluated by Physical Therapy with no further acute PT needs identified. All education has been completed and the patient has no further questions. See ambulatory oxygen saturation note separately. Currently sats drop to 84% after walking 5 feet on room air. Patient educated on use and frequency of incentive spirometer. PT is signing off. Thank you for this referral.        Recommendations for follow up therapy are one component of a multi-disciplinary discharge planning process, led by the attending physician.  Recommendations may be updated based on patient status, additional functional criteria and insurance authorization.  Follow Up Recommendations No PT follow up    Assistance Recommended at Discharge None  Functional Status Assessment Patient has not had a recent decline in their functional status  Equipment Recommendations  None recommended by PT    Recommendations for Other Services       Precautions / Restrictions Precautions Precautions: Other (comment) Precaution Comments: monitor O2 sats Restrictions Weight Bearing Restrictions: No      Mobility  Bed Mobility                    Transfers Overall transfer level: Independent                      Ambulation/Gait Ambulation/Gait assistance: Independent Gait Distance (Feet): 100 Feet Assistive device: None Gait Pattern/deviations: WFL(Within Functional Limits)          Stairs            Wheelchair Mobility    Modified Rankin (Stroke Patients Only)       Balance Overall balance assessment: Independent                                            Pertinent Vitals/Pain Pain Assessment: No/denies pain    Home Living Family/patient expects to be discharged to:: Private residence Living Arrangements: Other relatives (cousin) Available Help at Discharge: Family;Available PRN/intermittently Type of Home: House Home Access: Stairs to enter Entrance Stairs-Rails: None Entrance Stairs-Number of Steps: 1   Home Layout: One level Home Equipment: Shower seat;Grab bars - tub/shower;BSC/3in1;Rolling Environmental consultant (2 wheels)      Prior Function Prior Level of Function : Independent/Modified Independent             Mobility Comments: no device ADLs Comments: drives; independent with ADLs     Hand Dominance   Dominant Hand: Right    Extremity/Trunk Assessment   Upper Extremity Assessment Upper Extremity Assessment: Overall WFL for tasks assessed    Lower Extremity Assessment Lower Extremity Assessment: Overall WFL for tasks assessed    Cervical / Trunk Assessment Cervical / Trunk Assessment: Normal  Communication   Communication: No difficulties  Cognition Arousal/Alertness: Awake/alert Behavior During Therapy: WFL for tasks assessed/performed Overall Cognitive Status: Within Functional Limits for tasks assessed  General Comments General comments (skin integrity, edema, etc.): see ambulatory oxygenation note. Educated on use of and importance of IS. Pt able to draw 739ml.    Exercises     Assessment/Plan    PT Assessment Patient does not need any further PT services  PT Problem List         PT Treatment Interventions      PT Goals (Current goals can be found in the Care Plan section)  Acute Rehab PT Goals Patient Stated Goal: go home without oxygen PT Goal Formulation: All assessment and education complete, DC therapy    Frequency     Barriers to discharge        Co-evaluation               AM-PAC PT "6  Clicks" Mobility  Outcome Measure Help needed turning from your back to your side while in a flat bed without using bedrails?: None Help needed moving from lying on your back to sitting on the side of a flat bed without using bedrails?: None Help needed moving to and from a bed to a chair (including a wheelchair)?: None Help needed standing up from a chair using your arms (e.g., wheelchair or bedside chair)?: None Help needed to walk in hospital room?: None Help needed climbing 3-5 steps with a railing? : None 6 Click Score: 24    End of Session Equipment Utilized During Treatment: Oxygen Activity Tolerance: Patient tolerated treatment well Patient left: in chair;with call bell/phone within reach Nurse Communication: Mobility status;Other (comment) (sats dropped) PT Visit Diagnosis: Difficulty in walking, not elsewhere classified (R26.2)    Time: [id number] PT Time Calculation (min) (ACUTE ONLY): 20 min   Charges:   PT Evaluation $PT Eval Low Complexity: [place], PT Acute Rehabilitation Services  Pager [phone] Office [phone]   Rexanne Mano 05/07/2021, 8:30 AM

## 2021-05-07 NOTE — Progress Notes (Signed)
SATURATION QUALIFICATIONS: (This note is used to comply with regulatory documentation for home oxygen)  Patient Saturations on Room Air at Rest = 92%  Patient Saturations on Room Air while Ambulating = 84%  Patient Saturations on 2 Liters of oxygen while Ambulating = 93%  Please briefly explain why patient needs home oxygen:  To maintain oxygen >87% during functional mobility   Jerolyn Center, PT Acute Rehabilitation Services  Pager [phone] Office [phone]

## 2021-05-07 NOTE — Progress Notes (Signed)
PROGRESS NOTE    Kristopher [name]  JSE:[id number] DOB: 13-Mar-1951 DOA: 05/03/2021 PCP: Shirlean Mylar, MD (Inactive)    Chief Complaint  Patient presents with   Shortness of Breath    Brief Narrative:  Kristopher [name] is a 70 y.o. male with medical history significant for CAD, chronic diastolic CHF, COPD, OSA, pulmonary hypertension who presents for evaluation of shortness of breath, gradually worsening for the past 2 weeks.  Associated with a cough with thick yellow phlegm and hypoxia with O2 saturation in the 80s at home.  Work-up revealed acute COPD exacerbation with hypoxia.  05/07/2021: Patient was seen and examined at his bedside.  Reports persistent productive cough and wheezing.  Hypersaline neb, Mucinex added.  2 doses of IV Lasix 20 mg twice daily x2 ordered.  Failed his home oxygen evaluation with O2 saturation 84% on room air with ambulation.  Assessment & Plan:   Principal Problem:   COPD exacerbation (HCC) Active Problems:   Chronic combined systolic and diastolic heart failure (HCC)   HTN (hypertension)   OSA (obstructive sleep apnea)   Hypoxemia   Prolonged QT interval  Acute COPD exacerbation, unclear trigger COVID-19, influenza negative Chest x-ray done on admission showing low lung volumes with mild bibasilar atelectasis Due to concern for early He was started on Rocephin and doxycycline on admission to complete 5 days. Is also on IV Solu-Medrol, to complete 5 days of steroids  Acute hypoxic respiratory failure secondary to COPD exacerbation Continue management as stated above. Continue bronchodilators and antitussives as needed Added hypersaline nebs twice daily x2 days Added Mucinex 1200 mg twice daily x2 days Continue incentive spirometer. Mobilize as tolerated. Maintain O2 saturation greater than 90%. Failed home oxygen evaluation as stated above. Repeat home oxygen evaluation prior to discharge  Acute on chronic diastolic CHF Last 2D echo done on  02/22/2021 showed LVEF 70 to 75% with no regional wall motion abnormalities.  Biatrial dilatation, moderately. Mild volume overload on exam Continue home p.o. Lasix Add IV Lasix 20 mg x 2 doses Monitor strict I's and O's and daily weight  HTN BP stable Continue cardiac regimen Continue to closely monitor vital signs Maintain MAP greater than 65.  OSA Continue CPAP QHS  Prolonged QT QTC 521 on 05/03/2021.   Repeat twelve-lead EKG. Avoid QTC prolonging agents Optimize magnesium and potassium levels.   DVT prophylaxis: Lovenox subcu daily Code Status: full Family Communication: patient communicating plan w/daughters Disposition:  Status is: Inpatient Remains inpatient appropriate because: O2 req, medical necessity   Consultants:  N/A  Procedures:  N/a  Antimicrobials:  Anti-infectives (From admission, onward)    Start     Dose/Rate Route Frequency Ordered Stop   05/07/21 1145  cefTRIAXone (ROCEPHIN) 1 g in sodium chloride 0.9 % 100 mL IVPB        1 g 200 mL/hr over 30 Minutes Intravenous Every 24 hours 05/07/21 1050 05/09/21 1144   05/04/21 1800  cefTRIAXone (ROCEPHIN) 1 g in sodium chloride 0.9 % 100 mL IVPB        1 g 200 mL/hr over 30 Minutes Intravenous Every 24 hours 05/04/21 1641 05/06/21 1816   05/04/21 0030  doxycycline (VIBRA-TABS) tablet 100 mg        100 mg Oral Every 12 hours 05/04/21 0017             Objective: Vitals:   05/07/21 0337 05/07/21 0834 05/07/21 0835 05/07/21 0914  BP: 125/62   130/63  Pulse: 77   (!)  102  Resp: 16   18  Temp:    (!) 97.3 F (36.3 C)  TempSrc:    Oral  SpO2: 95% 98% 98% 96%  Weight:    101.4 kg  Height:        Intake/Output Summary (Last 24 hours) at 05/07/2021 1448 Last data filed at 05/07/2021 0800 Gross per 24 hour  Intake 600 ml  Output --  Net 600 ml   Filed Weights   05/03/21 2040 05/04/21 1305 05/07/21 0914  Weight: 101.2 kg 99.5 kg 101.4 kg    Examination:  General exam: Well-developed  well-nourished in no acute distress.  He is alert and oriented x3.   Respiratory system: Diffuse wheezing bilaterally, mild rales at bases.  Good inspiratory effort. Cardiovascular system: Regular rate and rhythm no rubs or gallops.   Gastrointestinal system: Obese nontender normal bowel sounds present.  Nondistended.   Neuro: Alert and oriented x3. Extremities: Trace lower extremity edema bilaterally. Skin: No rashes or ulcerative lesions noted. Psychiatry: Mood is appropriate for condition and setting.  Data Reviewed: I have personally reviewed following labs and imaging studies  CBC: Recent Labs  Lab 05/03/21 1952 05/03/21 2005 05/04/21 0337 05/05/21 0324 05/07/21 0616  WBC 16.5*  --  16.7* 18.1* 18.7*  NEUTROABS 13.0*  --   --   --   --   HGB 8.6* 10.5* 9.0* 8.8* 9.5*  HCT 30.8* 31.0* 31.9* 30.8* 33.0*  MCV 81.7  --  80.8 80.6 79.3*  PLT 419*  --  461* 456* 494*    Basic Metabolic Panel: Recent Labs  Lab 05/03/21 1952 05/03/21 2005 05/04/21 0337 05/05/21 0324 05/07/21 0616  NA 135 136 133* 137 135  K 3.1* 3.1* 3.6 4.3 4.1  CL 95*  --  94* 95* 95*  CO2 30  --  30 35* 29  GLUCOSE 99  --  142* 141* 110*  BUN 12  --  13 18 15   CREATININE 1.10  --  1.03 1.10 0.95  CALCIUM 8.7*  --  8.9 9.1 9.2  MG  --   --   --  2.7* 2.6*  PHOS  --   --   --  4.5 3.7    GFR: Estimated Creatinine Clearance: 82.1 mL/min (by C-G formula based on SCr of 0.95 mg/dL).  Liver Function Tests: Recent Labs  Lab 05/03/21 1952  AST 15  ALT 9  ALKPHOS 79  BILITOT 0.6  PROT 7.0  ALBUMIN 3.3*    CBG: No results for input(s): GLUCAP in the last 168 hours.   Recent Results (from the past 240 hour(s))  Resp Panel by RT-PCR (Flu A&B, Covid) Nasopharyngeal Swab     Status: None   Collection Time: 05/03/21  6:50 PM   Specimen: Nasopharyngeal Swab; Nasopharyngeal(NP) swabs in vial transport medium  Result Value Ref Range Status   SARS Coronavirus 2 by RT PCR NEGATIVE NEGATIVE Final     Comment: (NOTE) SARS-CoV-2 target nucleic acids are NOT DETECTED.  The SARS-CoV-2 RNA is generally detectable in upper respiratory specimens during the acute phase of infection. The lowest concentration of SARS-CoV-2 viral copies this assay can detect is 138 copies/mL. A negative result does not preclude SARS-Cov-2 infection and should not be used as the sole basis for treatment or other patient management decisions. A negative result may occur with  improper specimen collection/handling, submission of specimen other than nasopharyngeal swab, presence of viral mutation(s) within the areas targeted by this assay, and inadequate number of viral  copies(<138 copies/mL). A negative result must be combined with clinical observations, patient history, and epidemiological information. The expected result is Negative.  Fact Sheet for Patients:  BloggerCourse.com  Fact Sheet for Healthcare Providers:  SeriousBroker.it  This test is no t yet approved or cleared by the [place] FDA and  has been authorized for detection and/or diagnosis of SARS-CoV-2 by FDA under an Emergency Use Authorization (EUA). This EUA will remain  in effect (meaning this test can be used) for the duration of the COVID-19 declaration under Section 564(b)(1) of the Act, 21 U.S.C.section 360bbb-3(b)(1), unless the authorization is terminated  or revoked sooner.       Influenza A by PCR NEGATIVE NEGATIVE Final   Influenza B by PCR NEGATIVE NEGATIVE Final    Comment: (NOTE) The Xpert Xpress SARS-CoV-2/FLU/RSV plus assay is intended as an aid in the diagnosis of influenza from Nasopharyngeal swab specimens and should not be used as a sole basis for treatment. Nasal washings and aspirates are unacceptable for Xpert Xpress SARS-CoV-2/FLU/RSV testing.  Fact Sheet for Patients: BloggerCourse.com  Fact Sheet for Healthcare  Providers: SeriousBroker.it  This test is not yet approved or cleared by the [place] FDA and has been authorized for detection and/or diagnosis of SARS-CoV-2 by FDA under an Emergency Use Authorization (EUA). This EUA will remain in effect (meaning this test can be used) for the duration of the COVID-19 declaration under Section 564(b)(1) of the Act, 21 U.S.C. section 360bbb-3(b)(1), unless the authorization is terminated or revoked.  Performed at [facility], [address] [place], [place] [address]   Expectorated Sputum Assessment w Gram Stain, Rflx to Resp Cult     Status: None   Collection Time: 05/04/21  5:11 PM   Specimen: Expectorated Sputum  Result Value Ref Range Status   Specimen Description EXPECTORATED SPUTUM  Final   Special Requests NONE  Final   Sputum evaluation   Final    THIS SPECIMEN IS ACCEPTABLE FOR SPUTUM CULTURE Performed at [facility], [address] [place], [place] [address]    Report Status 05/04/2021 FINAL  Final  Culture, Respiratory w Gram Stain     Status: None   Collection Time: 05/04/21  5:11 PM  Result Value Ref Range Status   Specimen Description EXPECTORATED SPUTUM  Final   Special Requests NONE Reflexed from [id number]  Final   Gram Stain   Final    RARE SQUAMOUS EPITHELIAL CELLS PRESENT MODERATE WBC PRESENT, PREDOMINANTLY PMN MODERATE GRAM POSITIVE COCCI FEW GRAM POSITIVE RODS    Culture   Final    RARE Normal respiratory flora-no Staph aureus or Pseudomonas seen Performed at [facility], [address] [place], [place] [address]    Report Status 05/06/2021 FINAL  Final      Radiology Studies: No results found.    Scheduled Meds:  aspirin  81 mg Oral Daily   atorvastatin  40 mg Oral Daily   chlorhexidine  15 mL Mouth Rinse BID   doxycycline  100 mg Oral Q12H   empagliflozin  10 mg Oral Daily   enoxaparin (LOVENOX) injection  50 mg Subcutaneous Q24H   fluticasone  2 spray  Each Nare Daily   furosemide  20 mg Intravenous BID   furosemide  40 mg Oral BID   guaiFENesin  1,200 mg Oral BID   ipratropium-albuterol  3 mL Nebulization BID   mouth rinse  15 mL Mouth Rinse q12n4p   methylPREDNISolone (SOLU-MEDROL) injection  80 mg Intravenous Daily   mometasone-formoterol  2 puff Inhalation BID   potassium chloride  10 mEq Oral Daily   sacubitril-valsartan  1 tablet Oral BID   sodium chloride HYPERTONIC  4 mL Nebulization BID   Continuous Infusions:  cefTRIAXone (ROCEPHIN)  IV 1 g (05/07/21 1310)     LOS: 4 days    Darlin Drop, MD [facility]   To contact the attending provider between 7A-7P or the covering provider during after hours 7P-7A, please log into the web site [email or web address] and access using universal [place] password for that web site. If you do not have the password, please call the hospital operator.  05/07/2021, 2:48 PM

## 2021-05-08 DIAGNOSIS — J441 Chronic obstructive pulmonary disease with (acute) exacerbation: Secondary | ICD-10-CM | POA: Diagnosis not present

## 2021-05-08 MED ORDER — POTASSIUM CHLORIDE CRYS ER 20 MEQ PO TBCR
40.0000 meq | EXTENDED_RELEASE_TABLET | Freq: Every day | ORAL | Status: AC
  Administered 2021-05-08 – 2021-05-09 (×2): 40 meq via ORAL
  Filled 2021-05-08 (×2): qty 2

## 2021-05-08 MED ORDER — FUROSEMIDE 10 MG/ML IJ SOLN
40.0000 mg | Freq: Two times a day (BID) | INTRAMUSCULAR | Status: DC
  Administered 2021-05-08 – 2021-05-09 (×3): 40 mg via INTRAVENOUS
  Filled 2021-05-08 (×3): qty 4

## 2021-05-08 MED ORDER — PANTOPRAZOLE SODIUM 40 MG PO TBEC
40.0000 mg | DELAYED_RELEASE_TABLET | Freq: Two times a day (BID) | ORAL | Status: DC
  Administered 2021-05-08 – 2021-05-09 (×3): 40 mg via ORAL
  Filled 2021-05-08 (×3): qty 1

## 2021-05-08 MED ORDER — DICYCLOMINE HCL 10 MG PO CAPS
10.0000 mg | ORAL_CAPSULE | Freq: Once | ORAL | Status: AC
  Administered 2021-05-08: 10 mg via ORAL
  Filled 2021-05-08: qty 1

## 2021-05-08 MED ORDER — FUROSEMIDE 10 MG/ML IJ SOLN
20.0000 mg | Freq: Once | INTRAMUSCULAR | Status: AC
  Administered 2021-05-08: 20 mg via INTRAVENOUS
  Filled 2021-05-08: qty 2

## 2021-05-08 NOTE — TOC Progression Note (Signed)
Transition of Care [facility]) - Progression Note    Patient Details  Name: Kristopher [name] MRN: [id number] Date of Birth: Dec 28, 1950  Transition of [facility]) CM/SW Contact  Huston Foley Jacklynn Ganong, RN Phone Number: 05/08/2021, 1:09 PM  Clinical Narrative:   Patient is a 70 yr old gentleman, from home, admitted and treated for COPD Exacerbation. Patient states he lives with his cousin and is self sufficient. He is agreeable to have home oxygen if needed. CM contacted [name] with [employer] to arrange for oxygen delivery. No further needs identified.      Expected Discharge Plan: Home/Self Care Barriers to Discharge: Continued Medical Work up  Expected Discharge Plan and Services Expected Discharge Plan: Home/Self Care   Discharge Planning Services: CM Consult Post Acute Care Choice: Durable Medical Equipment Living arrangements for the past 2 months: Single Family Home                 DME Arranged: Oxygen DME Agency: [employer] Date DME Agency Contacted: 05/08/21 Time DME Agency Contacted: [phone] Representative spoke with at DME Agency: [place] HH Arranged: NA HH Agency: NA         Social Determinants of Health (SDOH) Interventions    Readmission Risk Interventions No flowsheet data found.

## 2021-05-08 NOTE — Progress Notes (Signed)
PROGRESS NOTE    Kristopher [name]  [id number] DOB: 1950-10-16 DOA: 05/03/2021 PCP: Maurice Small, MD (Inactive)    Chief Complaint  Patient presents with   Shortness of Breath    Brief Narrative:  Kristopher [name] is a 70 y.o. male with medical history significant for CAD, chronic diastolic CHF, COPD, chronic hypoxic hypercarbic respiratory failure, OSA compliant with CPAP, pulmonary hypertension who presents for evaluation of shortness of breath, gradually worsening for the past 2 weeks.  Associated with a cough with thick yellow phlegm and hypoxia with O2 saturation in the 80s at home.  Work-up revealed acute COPD exacerbation with hypoxia.  Kristopher [name] presents with acute on chronic hypoxemic respiratory failure due to COPD.  The use of the NIV will treat patient's high PCO2 levels (65.2 with elevated bicarbonate of 34) and can reduce risk of exacerbations and future hospitalizations when used at night and during the day.  Kristopher [name] readmitted within 2 months following his last hospitalization after NIV denial letter was received.  BiPAP [phone] and [id number]) has been proven ineffective to provide essential volume control necessary to maintain acceptable CO2 levels as ABGs demonstrate consistent hypercapnia despite best efforts with using BiPAP.  An NIV with IVAPS is necessary to prevent patient from life-threatening harm.  Interruption or failure to provide NIV wouldl quickly lead to exacerbation of the patient's condition, hospital admission, and likely harm to the patient.  Furthermore, [employer] Breathe A Little Easier Program has shown an 85.1% readmission reduction locally for COPD patients.  Continued use is preferred.  Patient is able to protect their airways and clear secretions on their own.  05/08/2021: Patient was seen and examined at his bedside.  He reports a persistent productive cough.  On empiric antibiotics, IV Solu-Medrol, scheduled bronchodilators, and pulmonary  toilet.  Assessment & Plan:   Principal Problem:   COPD exacerbation ([place]) Active Problems:   Chronic combined systolic and diastolic heart failure (HCC)   HTN (hypertension)   OSA (obstructive sleep apnea)   Hypoxemia   Prolonged QT interval  Acute COPD exacerbation, suspect secondary to CAP, POA COVID-19, influenza negative Chest x-ray done on admission showing low lung volumes with mild bibasilar atelectasis Due to concern for early community-acquired pneumonia, he was started on Rocephin and doxycycline on admission to complete 5 days. Currently on IV Solu-Medrol, to complete 5 days of steroids  Acute on chronic hypoxic hypercarbic respiratory failure secondary to COPD exacerbation At baseline he is on CPAP nightly Failed home oxygen evaluation, which revealed oxygen saturation 84% on room air while ambulating on 05/07/2021. History of chronic CO2 retainer, ABG PCO2 72.3 with pH 7.277 on 02/23/2021. Continue bronchodilators, wean off IV steroids Continue pulmonary toilet Maintain O2 saturation greater than 90% Recommend NIV due to chronic CO2 retention and hypoxia.  Acute on chronic diastolic CHF Last 2D echo done on 02/22/2021 showed LVEF 70 to 75% with no regional wall motion abnormalities.  Biatrial dilatation, moderate. Volume overload on exam Continue IV diuresing Continue strict I's and O's and daily weight  HTN BP stable. Continue IV Lasix 40 mg twice daily and Entresto.  OSA Continue CPAP QHS  Prolonged QT QTC 521 on 05/03/2021.   Repeat twelve-lead EKG. Avoid QTC prolonging agents Continue to optimize magnesium and potassium levels.   DVT prophylaxis: Lovenox subcu daily Code Status: full Family Communication: patient communicating plan w/daughters Disposition:  Status is: Inpatient Remains inpatient appropriate because: O2 req, medical necessity   Consultants:  N/A  Procedures:  N/a  Antimicrobials:  Anti-infectives (From admission, onward)     Start     Dose/Rate Route Frequency Ordered Stop   05/07/21 1145  cefTRIAXone (ROCEPHIN) 1 g in sodium chloride 0.9 % 100 mL IVPB        1 g 200 mL/hr over 30 Minutes Intravenous Every 24 hours 05/07/21 1050 05/08/21 1146   05/04/21 1800  cefTRIAXone (ROCEPHIN) 1 g in sodium chloride 0.9 % 100 mL IVPB        1 g 200 mL/hr over 30 Minutes Intravenous Every 24 hours 05/04/21 1641 05/06/21 1816   05/04/21 0030  doxycycline (VIBRA-TABS) tablet 100 mg        100 mg Oral Every 12 hours 05/04/21 0017             Objective: Vitals:   05/07/21 2108 05/08/21 0606 05/08/21 0823 05/08/21 0908  BP: 125/77 135/65 118/64   Pulse: 98 81 (!) 105   Resp: 20 16 20    Temp: 99.4 F (37.4 C) (!) 97.4 F (36.3 C) 98 F (36.7 C)   TempSrc: Oral Oral Oral   SpO2: 94% 95% 94% 95%  Weight:      Height:        Intake/Output Summary (Last 24 hours) at 05/08/2021 1641 Last data filed at 05/08/2021 1412 Gross per 24 hour  Intake 1400 ml  Output 401 ml  Net 999 ml   Filed Weights   05/03/21 2040 05/04/21 1305 05/07/21 0914  Weight: 101.2 kg 99.5 kg 101.4 kg    Examination:  General exam: Well-developed well-nourished in no acute distress.  He is alert and oriented x3.   Respiratory system: Mild rales at bases.  Mild wheezing noted bilaterally and improving.  Good inspiratory effort.   Cardiovascular system: Regular rate and rhythm no rubs or gallops.  No JVD noted.   Gastrointestinal system: Obese nontender normal bowel sounds present.   Neuro: Alert oriented x3. Extremities: Trace lower extremity edema bilaterally. Skin: No rashes or ulcerative lesions noted. Psychiatry: Mood is appropriate for condition and setting.  Data Reviewed: I have personally reviewed following labs and imaging studies  CBC: Recent Labs  Lab 05/03/21 1952 05/03/21 2005 05/04/21 0337 05/05/21 0324 05/07/21 0616  WBC 16.5*  --  16.7* 18.1* 18.7*  NEUTROABS 13.0*  --   --   --   --   HGB 8.6* 10.5* 9.0*  8.8* 9.5*  HCT 30.8* 31.0* 31.9* 30.8* 33.0*  MCV 81.7  --  80.8 80.6 79.3*  PLT 419*  --  461* 456* 494*    Basic Metabolic Panel: Recent Labs  Lab 05/03/21 1952 05/03/21 2005 05/04/21 0337 05/05/21 0324 05/07/21 0616  NA 135 136 133* 137 135  K 3.1* 3.1* 3.6 4.3 4.1  CL 95*  --  94* 95* 95*  CO2 30  --  30 35* 29  GLUCOSE 99  --  142* 141* 110*  BUN 12  --  13 18 15   CREATININE 1.10  --  1.03 1.10 0.95  CALCIUM 8.7*  --  8.9 9.1 9.2  MG  --   --   --  2.7* 2.6*  PHOS  --   --   --  4.5 3.7    GFR: Estimated Creatinine Clearance: 82.1 mL/min (by C-G formula based on SCr of 0.95 mg/dL).  Liver Function Tests: Recent Labs  Lab 05/03/21 1952  AST 15  ALT 9  ALKPHOS 79  BILITOT 0.6  PROT 7.0  ALBUMIN 3.3*  CBG: No results for input(s): GLUCAP in the last 168 hours.   Recent Results (from the past 240 hour(s))  Resp Panel by RT-PCR (Flu A&B, Covid) Nasopharyngeal Swab     Status: None   Collection Time: 05/03/21  6:50 PM   Specimen: Nasopharyngeal Swab; Nasopharyngeal(NP) swabs in vial transport medium  Result Value Ref Range Status   SARS Coronavirus 2 by RT PCR NEGATIVE NEGATIVE Final    Comment: (NOTE) SARS-CoV-2 target nucleic acids are NOT DETECTED.  The SARS-CoV-2 RNA is generally detectable in upper respiratory specimens during the acute phase of infection. The lowest concentration of SARS-CoV-2 viral copies this assay can detect is 138 copies/mL. A negative result does not preclude SARS-Cov-2 infection and should not be used as the sole basis for treatment or other patient management decisions. A negative result may occur with  improper specimen collection/handling, submission of specimen other than nasopharyngeal swab, presence of viral mutation(s) within the areas targeted by this assay, and inadequate number of viral copies(<138 copies/mL). A negative result must be combined with clinical observations, patient history, and  epidemiological information. The expected result is Negative.  Fact Sheet for Patients:  EntrepreneurPulse.com.au  Fact Sheet for Healthcare Providers:  IncredibleEmployment.be  This test is no t yet approved or cleared by the [place] FDA and  has been authorized for detection and/or diagnosis of SARS-CoV-2 by FDA under an Emergency Use Authorization (EUA). This EUA will remain  in effect (meaning this test can be used) for the duration of the COVID-19 declaration under Section 564(b)(1) of the Act, 21 U.S.C.section 360bbb-3(b)(1), unless the authorization is terminated  or revoked sooner.       Influenza A by PCR NEGATIVE NEGATIVE Final   Influenza B by PCR NEGATIVE NEGATIVE Final    Comment: (NOTE) The Xpert Xpress SARS-CoV-2/FLU/RSV plus assay is intended as an aid in the diagnosis of influenza from Nasopharyngeal swab specimens and should not be used as a sole basis for treatment. Nasal washings and aspirates are unacceptable for Xpert Xpress SARS-CoV-2/FLU/RSV testing.  Fact Sheet for Patients: EntrepreneurPulse.com.au  Fact Sheet for Healthcare Providers: IncredibleEmployment.be  This test is not yet approved or cleared by the [place] FDA and has been authorized for detection and/or diagnosis of SARS-CoV-2 by FDA under an Emergency Use Authorization (EUA). This EUA will remain in effect (meaning this test can be used) for the duration of the COVID-19 declaration under Section 564(b)(1) of the Act, 21 U.S.C. section 360bbb-3(b)(1), unless the authorization is terminated or revoked.  Performed at [facility], [place] [address] [place], [place] [address]   Expectorated Sputum Assessment w Gram Stain, Rflx to Resp Cult     Status: None   Collection Time: 05/04/21  5:11 PM   Specimen: Expectorated Sputum  Result Value Ref Range Status   Specimen Description EXPECTORATED SPUTUM   Final   Special Requests NONE  Final   Sputum evaluation   Final    THIS SPECIMEN IS ACCEPTABLE FOR SPUTUM CULTURE Performed at [facility], [place] [address] [place]  [address]    Report Status 05/04/2021 FINAL  Final  Culture, Respiratory w Gram Stain     Status: None   Collection Time: 05/04/21  5:11 PM  Result Value Ref Range Status   Specimen Description EXPECTORATED SPUTUM  Final   Special Requests NONE Reflexed from [id number]  Final   Gram Stain   Final    RARE SQUAMOUS EPITHELIAL CELLS PRESENT MODERATE WBC PRESENT, PREDOMINANTLY  PMN MODERATE GRAM POSITIVE COCCI FEW GRAM POSITIVE RODS    Culture   Final    RARE Normal respiratory flora-no Staph aureus or Pseudomonas seen Performed at [facility], [address], [place], [place] [address]    Report Status 05/06/2021 FINAL  Final      Radiology Studies: No results found.    Scheduled Meds:  aspirin  81 mg Oral Daily   atorvastatin  40 mg Oral Daily   chlorhexidine  15 mL Mouth Rinse BID   doxycycline  100 mg Oral Q12H   empagliflozin  10 mg Oral Daily   enoxaparin (LOVENOX) injection  50 mg Subcutaneous Q24H   furosemide  40 mg Intravenous BID   guaiFENesin  1,200 mg Oral BID   ipratropium-albuterol  3 mL Nebulization BID   mouth rinse  15 mL Mouth Rinse q12n4p   mometasone-formoterol  2 puff Inhalation BID   pantoprazole  40 mg Oral BID AC   potassium chloride  40 mEq Oral Daily   sacubitril-valsartan  1 tablet Oral BID   sodium chloride HYPERTONIC  4 mL Nebulization BID   Continuous Infusions:     LOS: 5 days    Darlin Drop, MD [facility]   To contact the attending provider between 7A-7P or the covering provider during after hours 7P-7A, please log into the web site [email or web address] and access using universal [place] password for that web site. If you do not have the password, please call the hospital operator.  05/08/2021, 4:41 PM

## 2021-05-08 NOTE — Care Management Important Message (Signed)
Important Message  Patient Details  Name: Kristopher [name] MRN: [id number] Date of Birth: 08-Feb-1951   Medicare Important Message Given:  Yes     [name] 05/08/2021, 3:10 PM

## 2021-05-08 NOTE — Progress Notes (Signed)
Pt places self on/off cpap when ready but knows to call if he needs assistance.  Rt will continue to monitor.

## 2021-05-09 ENCOUNTER — Other Ambulatory Visit (HOSPITAL_COMMUNITY): Payer: Self-pay

## 2021-05-09 DIAGNOSIS — J441 Chronic obstructive pulmonary disease with (acute) exacerbation: Secondary | ICD-10-CM | POA: Diagnosis not present

## 2021-05-09 MED ORDER — PANTOPRAZOLE SODIUM 40 MG PO TBEC
40.0000 mg | DELAYED_RELEASE_TABLET | Freq: Every day | ORAL | 0 refills | Status: DC
  Filled 2021-05-09: qty 90, 90d supply, fill #0

## 2021-05-09 MED ORDER — FERROUS SULFATE 325 (65 FE) MG PO TABS
325.0000 mg | ORAL_TABLET | Freq: Every day | ORAL | 0 refills | Status: DC
  Filled 2021-05-09: qty 90, 90d supply, fill #0

## 2021-05-09 MED ORDER — FUROSEMIDE 40 MG PO TABS
40.0000 mg | ORAL_TABLET | Freq: Two times a day (BID) | ORAL | 0 refills | Status: DC
  Filled 2021-05-09: qty 180, 90d supply, fill #0

## 2021-05-09 MED ORDER — ATORVASTATIN CALCIUM 40 MG PO TABS
40.0000 mg | ORAL_TABLET | Freq: Every day | ORAL | 0 refills | Status: DC
  Filled 2021-05-09: qty 90, 90d supply, fill #0

## 2021-05-09 NOTE — Discharge Summary (Signed)
Discharge Summary  Kristopher [name] [id number] DOB: October 04, 1950  PCP: Maurice Small, MD (Inactive)  Admit date: 05/03/2021 Discharge date: 05/09/2021  Time spent: 35 minutes.  Recommendations for Outpatient Follow-up:  Follow-up with pulmonary Follow-up with your primary care provider Follow-up with your cardiologist Take your medications as prescribed  Discharge Diagnoses:  Active Hospital Problems   Diagnosis Date Noted   COPD exacerbation ([place]) 05/03/2021   Hypoxemia 05/03/2021   Prolonged QT interval 05/03/2021   OSA (obstructive sleep apnea) 11/18/2018   HTN (hypertension)    Chronic combined systolic and diastolic heart failure ([place]) 08/11/2016    Resolved Hospital Problems  No resolved problems to display.    Discharge Condition: Stable  Diet recommendation: Resume previous diet.  Vitals:   05/09/21 0906 05/09/21 1045  BP:  104/80  Pulse:  100  Resp:  18  Temp:  97.8 F (36.6 C)  SpO2: 92% 93%    History of present illness:  Kristopher [name] is a 70 y.o. male with medical history significant for CAD, chronic diastolic CHF, COPD, chronic hypoxic hypercarbic respiratory failure on BiPAP nightly, OSA compliant with BiPAP, pulmonary hypertension who presented to [facility] ED for evaluation of shortness of breath, gradually worsening for the past 2 weeks.  Associated with a cough productive of thick yellow phlegm, hypoxia with O2 saturation in the 80s at home.  Work-up revealed acute COPD exacerbation with concern for early CAP and hypoxia.   Kristopher [name] presents with acute on chronic hypoxemic respiratory failure due to COPD.  The use of the NIV will treat patient's high PCO2 levels (65.2 with elevated bicarbonate of 34) and can reduce risk of exacerbations and future hospitalizations when used at night and during the day.  Kristopher [name] readmitted within 2 months following his last hospitalization after NIV denial letter was received.  BiPAP [phone] and [id number]) has been proven  ineffective to provide essential volume control necessary to maintain acceptable CO2 levels as ABGs demonstrate consistent hypercapnia despite best efforts with using BiPAP.  An NIV with IVAPS is necessary to prevent patient from life-threatening harm.  Interruption or failure to provide NIV wouldl quickly lead to exacerbation of the patient's condition, hospital admission, and likely harm to the patient.  Furthermore, [employer] Breathe A Little Easier Program has shown an 85.1% readmission reduction locally for COPD patients.  Continued use is preferred.  Patient is able to protect their airways and clear secretions on their own.   05/09/2021: Patient was seen at bedside.  There were no acute events overnight.  Symptomatology is improved, breathing better cough is also improved.  Hospital Course:  Principal Problem:   COPD exacerbation ([place]) Active Problems:   Chronic combined systolic and diastolic heart failure ([facility])   HTN (hypertension)   OSA (obstructive sleep apnea)   Hypoxemia   Prolonged QT interval  Acute COPD exacerbation, suspect secondary to CAP, POA COVID-19, influenza negative Chest x-ray done on admission showing low lung volumes with mild bibasilar atelectasis Due to concern for early community-acquired pneumonia, he was started on Rocephin and doxycycline on admission to complete 5 days. Completed IV Solu-Medrol  Continue home regimen Follow-up with pulmonary outpatient.   Acute on chronic hypoxic hypercarbic respiratory failure secondary to COPD exacerbation At baseline he is on BiPAP nightly Failed home oxygen evaluation, which revealed oxygen saturation 84% on room air while ambulating on 05/07/2021. History of chronic CO2 retainer, ABG PCO2 72.3 with pH 7.277 on 02/23/2021. Maintain a saturation greater than 90% Continue home regimen  Follow-up with pulmonary outpatient  Acute on chronic diastolic CHF Last 2D echo done on 02/22/2021 showed LVEF 70 to 75% with no  regional wall motion abnormalities.  Biatrial dilatation, moderate. Volume overload on exam He received IV diuresing Continue home cardiac regimen Follow-up with cardiology outpatient   HTN, stable Continue home cardiac regimen   OSA Continue CPAP QHS   Prolonged QT QTC 521 on 05/03/2021.   Avoid QTC prolonging agents Follow-up with cardiology.      Code Status: full    Consultants:  N/A   Procedures:  N/a   Discharge Exam: BP 104/80 (BP Location: Left Arm)   Pulse 100   Temp 97.8 F (36.6 C)   Resp 18   Ht 5\' 7"  (1.702 m)   Wt 100.8 kg   SpO2 93%   BMI 34.81 kg/m  General: 70 y.o. year-old male well developed well nourished in no acute distress.  Alert and oriented x3. Cardiovascular: Regular rate and rhythm with no rubs or gallops.  No thyromegaly or JVD noted.   Respiratory: Clear to auscultation with no wheezes or rales. Good inspiratory effort. Abdomen: Soft nontender nondistended with normal bowel sounds x4 quadrants. Musculoskeletal: No lower extremity edema. 2/4 pulses in all 4 extremities. Skin: No ulcerative lesions noted or rashes, Psychiatry: Mood is appropriate for condition and setting  Discharge Instructions You were cared for by a hospitalist during your hospital stay. If you have any questions about your discharge medications or the care you received while you were in the hospital after you are discharged, you can call the unit and asked to speak with the hospitalist on call if the hospitalist that took care of you is not available. Once you are discharged, your primary care physician will handle any further medical issues. Please note that NO REFILLS for any discharge medications will be authorized once you are discharged, as it is imperative that you return to your primary care physician (or establish a relationship with a primary care physician if you do not have one) for your aftercare needs so that they can reassess your need for medications and  monitor your lab values.   Allergies as of 05/09/2021       Reactions   Penicillins Other (See Comments)   Pt tolerated ceftriaxone. Pt reports it makes him feel shaky Has patient had a PCN reaction causing immediate rash, facial/tongue/throat swelling, SOB or lightheadedness with hypotension: YES Has patient had a PCN reaction causing severe rash involving mucus membranes or skin necrosis: NO Has patient had a PCN reaction that required hospitalization NO Has patient had a PCN reaction occurring within the last 10 years: NO If all of the above answers are "NO", then may proceed with Cephalosporin use.        Medication List     STOP taking these medications    ipratropium 0.02 % nebulizer solution Commonly known as: ATROVENT   Jardiance 10 MG Tabs tablet Generic drug: empagliflozin       TAKE these medications    acetaminophen 325 MG tablet Commonly known as: TYLENOL Take 650 mg by mouth every 6 (six) hours as needed for mild pain.   albuterol (2.5 MG/3ML) 0.083% nebulizer solution Commonly known as: PROVENTIL Inhale 3 mLs into the lungs in the morning and at bedtime.   aspirin 81 MG chewable tablet Chew 1 tablet (81 mg total) by mouth daily.   atorvastatin 40 MG tablet Commonly known as: LIPITOR Take 1 tablet (40 mg total) by mouth  daily. What changed: additional instructions   docusate sodium 100 MG capsule Commonly known as: COLACE Take 1 capsule (100 mg total) by mouth 2 (two) times daily as needed for mild constipation.   FeroSul 325 (65 FE) MG tablet Generic drug: ferrous sulfate Take 1 tablet (325 mg total) by mouth daily with breakfast. What changed: when to take this   furosemide 40 MG tablet Commonly known as: LASIX Take 1 tablet (40 mg total) by mouth 2 (two) times daily. What changed:  how much to take when to take this Another medication with the same name was removed. Continue taking this medication, and follow the directions you see  here.   magnesium hydroxide 400 MG/5ML suspension Commonly known as: MILK OF MAGNESIA Take 30 mLs by mouth See admin instructions. Tuesday and thursday   pantoprazole 40 MG tablet Commonly known as: PROTONIX Take 1 tablet (40 mg total) by mouth daily.   polyethylene glycol 17 g packet Commonly known as: MIRALAX / GLYCOLAX Take 17 g by mouth 2 (two) times daily as needed for moderate constipation or severe constipation.   potassium chloride 10 MEQ tablet Commonly known as: KLOR-CON M Take 1 tablet (10 mEq total) by mouth daily.   sacubitril-valsartan 49-51 MG Commonly known as: ENTRESTO Take 1 tablet by mouth 2 (two) times daily. What changed: when to take this   Symbicort 160-4.5 MCG/ACT inhaler Generic drug: budesonide-formoterol Inhale 1 puff into the lungs in the morning and at bedtime.       Allergies  Allergen Reactions   Penicillins Other (See Comments)    Pt tolerated ceftriaxone. Pt reports it makes him feel shaky Has patient had a PCN reaction causing immediate rash, facial/tongue/throat swelling, SOB or lightheadedness with hypotension: YES Has patient had a PCN reaction causing severe rash involving mucus membranes or skin necrosis: NO Has patient had a PCN reaction that required hospitalization NO Has patient had a PCN reaction occurring within the last 10 years: NO If all of the above answers are "NO", then may proceed with Cephalosporin use.    Follow-up Information     Maurice Small, MD. Call today.   Specialty: Family Medicine Why: Please call for a posthospital follow-up appointment. Contact information: [place] [address] [phone]         Sueanne Margarita, MD .   Specialty: Cardiology Contact information: [phone] [place] [address] [phone]         Bensimhon, Shaune Pascal, MD .   Specialty: Cardiology Contact information: [address] [place]  [address] [phone]         Deneise Lever, MD. Call today.   Specialty: Pulmonary Disease Why: Please call for a posthospital follow-up appointment. Contact information: [place] [address] [phone]                  The results of significant diagnostics from this hospitalization (including imaging, microbiology, ancillary and laboratory) are listed below for reference.    Significant Diagnostic Studies: DG Chest 2 View  Result Date: 05/03/2021 CLINICAL DATA:  Cough, shortness of breath EXAM: CHEST - 2 VIEW COMPARISON:  02/24/2021 FINDINGS: Stable cardiomediastinal contours. Low lung volumes with mild bibasilar atelectasis. No new focal airspace consolidation. No pleural effusion or pneumothorax. IMPRESSION: Low lung volumes with mild bibasilar atelectasis. Electronically Signed   By: Davina Poke D.O.   On: 05/03/2021 20:25  Microbiology: Recent Results (from the past 240 hour(s))  Resp Panel by RT-PCR (Flu A&B, Covid) Nasopharyngeal Swab     Status: None   Collection Time: 05/03/21  6:50 PM   Specimen: Nasopharyngeal Swab; Nasopharyngeal(NP) swabs in vial transport medium  Result Value Ref Range Status   SARS Coronavirus 2 by RT PCR NEGATIVE NEGATIVE Final    Comment: (NOTE) SARS-CoV-2 target nucleic acids are NOT DETECTED.  The SARS-CoV-2 RNA is generally detectable in upper respiratory specimens during the acute phase of infection. The lowest concentration of SARS-CoV-2 viral copies this assay can detect is 138 copies/mL. A negative result does not preclude SARS-Cov-2 infection and should not be used as the sole basis for treatment or other patient management decisions. A negative result may occur with  improper specimen collection/handling, submission of specimen other than nasopharyngeal swab, presence of viral mutation(s) within the areas targeted by this assay, and inadequate number of viral copies(<138 copies/mL). A  negative result must be combined with clinical observations, patient history, and epidemiological information. The expected result is Negative.  Fact Sheet for Patients:  EntrepreneurPulse.com.au  Fact Sheet for Healthcare Providers:  IncredibleEmployment.be  This test is no t yet approved or cleared by the [place] FDA and  has been authorized for detection and/or diagnosis of SARS-CoV-2 by FDA under an Emergency Use Authorization (EUA). This EUA will remain  in effect (meaning this test can be used) for the duration of the COVID-19 declaration under Section 564(b)(1) of the Act, 21 U.S.C.section 360bbb-3(b)(1), unless the authorization is terminated  or revoked sooner.       Influenza A by PCR NEGATIVE NEGATIVE Final   Influenza B by PCR NEGATIVE NEGATIVE Final    Comment: (NOTE) The Xpert Xpress SARS-CoV-2/FLU/RSV plus assay is intended as an aid in the diagnosis of influenza from Nasopharyngeal swab specimens and should not be used as a sole basis for treatment. Nasal washings and aspirates are unacceptable for Xpert Xpress SARS-CoV-2/FLU/RSV testing.  Fact Sheet for Patients: EntrepreneurPulse.com.au  Fact Sheet for Healthcare Providers: IncredibleEmployment.be  This test is not yet approved or cleared by the [place] FDA and has been authorized for detection and/or diagnosis of SARS-CoV-2 by FDA under an Emergency Use Authorization (EUA). This EUA will remain in effect (meaning this test can be used) for the duration of the COVID-19 declaration under Section 564(b)(1) of the Act, 21 U.S.C. section 360bbb-3(b)(1), unless the authorization is terminated or revoked.  Performed at [facility], [place] [address], [place], [place] [address]   Expectorated Sputum Assessment w Gram Stain, Rflx to Resp Cult     Status: None   Collection Time: 05/04/21  5:11 PM   Specimen: Expectorated  Sputum  Result Value Ref Range Status   Specimen Description EXPECTORATED SPUTUM  Final   Special Requests NONE  Final   Sputum evaluation   Final    THIS SPECIMEN IS ACCEPTABLE FOR SPUTUM CULTURE Performed at [facility], [place] [address] [place], [place] [address]    Report Status 05/04/2021 FINAL  Final  Culture, Respiratory w Gram Stain     Status: None   Collection Time: 05/04/21  5:11 PM  Result Value Ref Range Status   Specimen Description EXPECTORATED SPUTUM  Final   Special Requests NONE Reflexed from [id number]  Final   Gram Stain   Final    RARE SQUAMOUS EPITHELIAL CELLS PRESENT MODERATE WBC PRESENT, PREDOMINANTLY PMN MODERATE GRAM POSITIVE COCCI FEW GRAM POSITIVE RODS  Culture   Final    RARE Normal respiratory flora-no Staph aureus or Pseudomonas seen Performed at [facility], [address], [place], [place] [address]    Report Status 05/06/2021 FINAL  Final     Labs: Basic Metabolic Panel: Recent Labs  Lab 05/03/21 1952 05/03/21 2005 05/04/21 0337 05/05/21 0324 05/07/21 0616  NA 135 136 133* 137 135  K 3.1* 3.1* 3.6 4.3 4.1  CL 95*  --  94* 95* 95*  CO2 30  --  30 35* 29  GLUCOSE 99  --  142* 141* 110*  BUN 12  --  13 18 15   CREATININE 1.10  --  1.03 1.10 0.95  CALCIUM 8.7*  --  8.9 9.1 9.2  MG  --   --   --  2.7* 2.6*  PHOS  --   --   --  4.5 3.7   Liver Function Tests: Recent Labs  Lab 05/03/21 1952  AST 15  ALT 9  ALKPHOS 79  BILITOT 0.6  PROT 7.0  ALBUMIN 3.3*   No results for input(s): LIPASE, AMYLASE in the last 168 hours. No results for input(s): AMMONIA in the last 168 hours. CBC: Recent Labs  Lab 05/03/21 1952 05/03/21 2005 05/04/21 0337 05/05/21 0324 05/07/21 0616  WBC 16.5*  --  16.7* 18.1* 18.7*  NEUTROABS 13.0*  --   --   --   --   HGB 8.6* 10.5* 9.0* 8.8* 9.5*  HCT 30.8* 31.0* 31.9* 30.8* 33.0*  MCV 81.7  --  80.8 80.6 79.3*  PLT 419*  --  461* 456* 494*   Cardiac Enzymes: No results for input(s):  CKTOTAL, CKMB, CKMBINDEX, TROPONINI in the last 168 hours. BNP: BNP (last 3 results) Recent Labs    02/22/21 1727  BNP 480.0*    ProBNP (last 3 results) No results for input(s): PROBNP in the last 8760 hours.  CBG: No results for input(s): GLUCAP in the last 168 hours.     Signed:  02/24/21, MD [facility] 05/09/2021, 4:54 PM

## 2021-05-09 NOTE — Progress Notes (Signed)
DISCHARGE NOTE HOME Ved H Gundy to be discharged Home per MD order. Discussed prescriptions and follow up appointments with the patient. Prescriptions given to patient; medication list explained in detail. Patient verbalized understanding.  Skin clean, dry and intact without evidence of skin break down, no evidence of skin tears noted. IV catheter discontinued intact. Site without signs and symptoms of complications. Dressing and pressure applied. Pt denies pain at the site currently. No complaints noted.  Patient free of lines, drains, and wounds.   An After Visit Summary (AVS) was printed and given to the patient. Patient escorted via wheelchair, and discharged home via private auto.  Lorine Bears, RN

## 2021-05-09 NOTE — Plan of Care (Signed)
  Problem: Clinical Measurements: Goal: Ability to maintain clinical measurements within normal limits will improve Outcome: Progressing Goal: Will remain free from infection Outcome: Progressing Goal: Respiratory complications will improve Outcome: Progressing Goal: Cardiovascular complication will be avoided Outcome: Progressing   

## 2021-05-11 DIAGNOSIS — J449 Chronic obstructive pulmonary disease, unspecified: Secondary | ICD-10-CM | POA: Diagnosis not present

## 2021-05-11 DIAGNOSIS — J9622 Acute and chronic respiratory failure with hypercapnia: Secondary | ICD-10-CM | POA: Diagnosis not present

## 2021-05-15 ENCOUNTER — Encounter (HOSPITAL_BASED_OUTPATIENT_CLINIC_OR_DEPARTMENT_OTHER): Payer: Medicare Other | Admitting: Pulmonary Disease

## 2021-05-18 DIAGNOSIS — J441 Chronic obstructive pulmonary disease with (acute) exacerbation: Secondary | ICD-10-CM | POA: Diagnosis not present

## 2021-05-18 DIAGNOSIS — D509 Iron deficiency anemia, unspecified: Secondary | ICD-10-CM | POA: Diagnosis not present

## 2021-05-18 DIAGNOSIS — I509 Heart failure, unspecified: Secondary | ICD-10-CM | POA: Diagnosis not present

## 2021-05-24 ENCOUNTER — Telehealth (HOSPITAL_COMMUNITY): Payer: Self-pay | Admitting: Pharmacy Technician

## 2021-05-24 ENCOUNTER — Other Ambulatory Visit (HOSPITAL_COMMUNITY): Payer: Self-pay

## 2021-05-24 NOTE — Telephone Encounter (Signed)
Advanced Heart Failure Patient Advocate Encounter  Spoke to patient regarding re-enrollment of Entresto assistance with [employer]. Sent his daughter Clelia Schaumann the docusign link. They are both aware that he would need to also provide POI (advised them to get a copy of the social security statement).   Will fax in once all signatures are obtained.   Archer Asa, CPhT

## 2021-05-27 DIAGNOSIS — G4731 Primary central sleep apnea: Secondary | ICD-10-CM | POA: Diagnosis not present

## 2021-05-27 DIAGNOSIS — J45998 Other asthma: Secondary | ICD-10-CM | POA: Diagnosis not present

## 2021-05-30 ENCOUNTER — Other Ambulatory Visit (HOSPITAL_COMMUNITY): Payer: Self-pay

## 2021-05-30 MED ORDER — SACUBITRIL-VALSARTAN 49-51 MG PO TABS: 1.0000 | ORAL_TABLET | Freq: Two times a day (BID) | ORAL | 3 refills | Status: DC

## 2021-06-07 DIAGNOSIS — E785 Hyperlipidemia, unspecified: Secondary | ICD-10-CM | POA: Diagnosis not present

## 2021-06-07 DIAGNOSIS — I10 Essential (primary) hypertension: Secondary | ICD-10-CM | POA: Diagnosis not present

## 2021-06-07 DIAGNOSIS — E1159 Type 2 diabetes mellitus with other circulatory complications: Secondary | ICD-10-CM | POA: Diagnosis not present

## 2021-06-07 DIAGNOSIS — J441 Chronic obstructive pulmonary disease with (acute) exacerbation: Secondary | ICD-10-CM | POA: Diagnosis not present

## 2021-06-07 DIAGNOSIS — I272 Pulmonary hypertension, unspecified: Secondary | ICD-10-CM | POA: Diagnosis not present

## 2021-06-07 DIAGNOSIS — E1165 Type 2 diabetes mellitus with hyperglycemia: Secondary | ICD-10-CM | POA: Diagnosis not present

## 2021-06-07 DIAGNOSIS — I5042 Chronic combined systolic (congestive) and diastolic (congestive) heart failure: Secondary | ICD-10-CM | POA: Diagnosis not present

## 2021-06-07 DIAGNOSIS — I2510 Atherosclerotic heart disease of native coronary artery without angina pectoris: Secondary | ICD-10-CM | POA: Diagnosis not present

## 2021-06-07 DIAGNOSIS — J45998 Other asthma: Secondary | ICD-10-CM | POA: Diagnosis not present

## 2021-06-07 DIAGNOSIS — J449 Chronic obstructive pulmonary disease, unspecified: Secondary | ICD-10-CM | POA: Diagnosis not present

## 2021-06-11 DIAGNOSIS — J449 Chronic obstructive pulmonary disease, unspecified: Secondary | ICD-10-CM | POA: Diagnosis not present

## 2021-06-11 DIAGNOSIS — J9622 Acute and chronic respiratory failure with hypercapnia: Secondary | ICD-10-CM | POA: Diagnosis not present

## 2021-06-14 NOTE — Progress Notes (Signed)
Advanced HF Clinic Note ID:  Kristopher [name], Kristopher [name] [birth year], MRN [id number]  Location: Home  Provider location: [address] [place] Type of Visit: Established patient   PCP:  Maurice Small, MD  Cardiologist:  Fransico Him, MD Primary HF: Dr Haroldine Laws  Chief Complaint: F/U for Chronic Systolic Heart Failure    History of Present Illness: Kristopher [name] is a 71 y.o. male with a history of obesity, HTN, DM2, Asthma, Chronic combined diastolic/systolic CHF, severe OSA (AHI 65) and NICM.    Admitted 2/10 - 08/08/2018 with mixed AECOPD and A/C combined CHF. Initially required intubation in the setting of resp failure. Had [facility] as below in the setting of new drop in EF to 25-30%. Medications titrated as tolerated. He was discharged to SNF with CPAP (also new for him). D/c weight 235 pounds   Sleep study 10/31/18 with severe OSA (AHI 65). Now on CPAP w/ good compliance. Also w/ improved medication compliance w/ the help of paramedicine.   Echo 6/20 and showed persistently low EF, ~ 25-30%. He was referred to EP for ICD but declined. Does not want device.   Admitted 9/22 for acute hypercarbic respiratory failure (off CPAP x 2 months), AKI and possible sepsis. Echo 9/22 EF 70-75%, hyperdynamic LV function, biatrial enlargement, mild MR, mild AI, RV okay. Diuresed with IV Lasix, beta blocker held with wenckebach, and discharged on 2L home oxygen and trilogy ventilator.  Admitted 11/22 for COPD exacerbation and CAP. Treated with steroids and abx. He received IV diuresis and Jardiance was stopped at discharge.  Today he returns for post hospitalization HF follow up. Last seen in clinic 4/21 and was doing well from CHF standpoint. Overall feeling fine today. He is not SOB with stairs or carrying groceries. Denies palpitations, CP, dizziness, edema, or PND/Orthopnea. Appetite ok. No fever or chills. Weight at home 224 pounds. Taking all medications. He stopped wearing his CPAP. He has split  sleep study scheduled and follow up with Pulmonary. He is not wearing oxygen.   Cardiac Studies  - Echo 9/22: EF 70-75%, mild LVH, normal RV  - R/LHC 2/20  Prox LAD lesion is 30% stenosed. Prox RCA to Mid RCA lesion is 30% stenosed. Prox Cx lesion is 30% stenosed.   Findings:   Ao = 107/69 (87)  LV =  119/13 RA =  5 RV = 64/11 PA = 71/23 (39) PCW = 21 Fick cardiac output/index = 5.6/2.6 PVR = 3.1 WU Ao sat = 94% PA sat = 64%, 65%   Assessment: 1. Minimal non-obstructive CAD 2. NICM EF 30-35% 3. Moderate pulmonary HTN due primarily to OSA/OHS    - Echo 6/20 EF 25-30%, moderate LVH, abnormal septal motion consistent w/ LBBB, trivial pericardial effusion.   Past Medical History:  Diagnosis Date   Acute combined systolic and diastolic heart failure ([place]) 08/11/2016   Acute esophagitis 08/17/2016   Acute upper GI bleed 08/10/2016   Asthma    CAD (coronary artery disease) 08/16/2016   CHF (congestive heart failure) (HCC)    COPD (chronic obstructive pulmonary disease) ([place]) 11/06/2019   DCM (dilated cardiomyopathy) ([place])    Dyslipidemia 11/06/2019   Dyspnea    HTN (hypertension)    LBBB (left bundle branch block) 08/10/2016   Lymphadenopathy 08/17/2016   OSA (obstructive sleep apnea) 11/18/2018   [place] home sleep study revealed Severe Obstructive Sleep Apnea with AHI 65.8/hr. Central sleep apnea was noted with an pAHIc of 11.4/hr. % Cheyne Stokes respirations  was 10.3%. Nocturnal Hypoxemia was noted with O2 saturations as low as 72%. Time spent with Oxygen desaturations < 88% was 97.6 minutes.   PUD (peptic ulcer disease) 08/17/2016   Pulmonary HTN ([place]) 08/17/2016   Past Surgical History:  Procedure Laterality Date   BIOPSY  11/07/2019   Procedure: BIOPSY;  Surgeon: Otis Brace, MD;  Location: [place];  Service: Gastroenterology;;   BIOPSY  11/08/2019   Procedure: BIOPSY;  Surgeon: Otis Brace, MD;  Location: [place];  Service: Gastroenterology;;   BIOPSY   02/25/2021   Procedure: BIOPSY;  Surgeon: Otis Brace, MD;  Location: [place];  Service: Gastroenterology;;   COLONOSCOPY WITH PROPOFOL N/A 11/08/2019   Procedure: COLONOSCOPY WITH PROPOFOL;  Surgeon: Otis Brace, MD;  Location: [place];  Service: Gastroenterology;  Laterality: N/A;   ESOPHAGOGASTRODUODENOSCOPY (EGD) WITH PROPOFOL Left 08/11/2016   Procedure: ESOPHAGOGASTRODUODENOSCOPY (EGD) WITH PROPOFOL;  Surgeon: Arta Silence, MD;  Location: [facility] ENDOSCOPY;  Service: Endoscopy;  Laterality: Left;   ESOPHAGOGASTRODUODENOSCOPY (EGD) WITH PROPOFOL Left 11/07/2019   Procedure: ESOPHAGOGASTRODUODENOSCOPY (EGD) WITH PROPOFOL;  Surgeon: Otis Brace, MD;  Location: [facility] ENDOSCOPY;  Service: Gastroenterology;  Laterality: Left;   ESOPHAGOGASTRODUODENOSCOPY (EGD) WITH PROPOFOL N/A 02/25/2021   Procedure: ESOPHAGOGASTRODUODENOSCOPY (EGD) WITH PROPOFOL;  Surgeon: Otis Brace, MD;  Location: [place];  Service: Gastroenterology;  Laterality: N/A;   POLYPECTOMY  11/08/2019   Procedure: POLYPECTOMY;  Surgeon: Otis Brace, MD;  Location: [place] ENDOSCOPY;  Service: Gastroenterology;;   RIGHT/LEFT HEART CATH AND CORONARY ANGIOGRAPHY N/A 08/15/2016   Procedure: Right/Left Heart Cath and Coronary Angiography;  Surgeon: Jettie Booze, MD;  Location: [place] CV LAB;  Service: Cardiovascular;  Laterality: N/A;   RIGHT/LEFT HEART CATH AND CORONARY ANGIOGRAPHY N/A 08/04/2018   Procedure: RIGHT/LEFT HEART CATH AND CORONARY ANGIOGRAPHY;  Surgeon: Jolaine Artist, MD;  Location: [place] CV LAB;  Service: Cardiovascular;  Laterality: N/A;   Current Outpatient Medications  Medication Sig Dispense Refill   acetaminophen (TYLENOL) 325 MG tablet Take 650 mg by mouth every 6 (six) hours as needed for mild pain.      albuterol (PROVENTIL) (2.5 MG/3ML) 0.083% nebulizer solution Inhale 3 mLs into the lungs in the morning and at bedtime.     aspirin 81 MG chewable tablet Chew 1  tablet (81 mg total) by mouth daily. 30 tablet 6   atorvastatin (LIPITOR) 40 MG tablet Take 1 tablet (40 mg total) by mouth daily. 90 tablet 0   docusate sodium (COLACE) 100 MG capsule Take 1 capsule (100 mg total) by mouth 2 (two) times daily as needed for mild constipation. 30 capsule 0   ferrous sulfate 325 (65 FE) MG tablet Take 1 tablet (325 mg total) by mouth daily with breakfast. 90 tablet 0   furosemide (LASIX) 40 MG tablet Take 1 tablet (40 mg total) by mouth 2 (two) times daily. 180 tablet 0   magnesium hydroxide (MILK OF MAGNESIA) 400 MG/5ML suspension Take 30 mLs by mouth See admin instructions. Tuesday and thursday     pantoprazole (PROTONIX) 40 MG tablet Take 1 tablet (40 mg total) by mouth daily. 90 tablet 0   potassium chloride (KLOR-CON) 10 MEQ tablet Take 1 tablet (10 mEq total) by mouth daily. 30 tablet 3   sacubitril-valsartan (ENTRESTO) 49-51 MG Take 1 tablet by mouth 2 (two) times daily. 180 tablet 3   SYMBICORT 160-4.5 MCG/ACT inhaler Inhale 1 puff into the lungs in the morning and at bedtime.     No current facility-administered medications for this encounter.  Allergies:   Penicillins   Social History:  The patient  reports that he quit smoking about 23 years ago. His smoking use included cigarettes. He has a 22.00 pack-year smoking history. He has never used smokeless tobacco. He reports that he does not currently use alcohol. He reports that he does not currently use drugs.   Family History:  The patient's family history includes Hypertension in his mother and sister.   ROS:  Please see the history of present illness.   All other systems are personally reviewed and negative.   BP 130/75    Pulse 88    Wt 103.9 kg (229 lb)    SpO2 93%    BMI 35.87 kg/m   PHYSICAL EXAM: General:  NAD. No resp difficulty HEENT: Normal Neck: Supple. Thick neck. Carotids 2+ bilat; no bruits. No lymphadenopathy or thryomegaly appreciated. Cor: PMI nondisplaced. Regular rate & rhythm.  No rubs, gallops or murmurs. Lungs: Clear Abdomen: Obese, nontender, nondistended. No hepatosplenomegaly. No bruits or masses. Good bowel sounds. Extremities: No cyanosis, clubbing, rash, edema Neuro: Alert & oriented x 3, cranial nerves grossly intact. Moves all 4 extremities w/o difficulty. Affect pleasant.  ECG (personally reviewed): SR LBBB qrs 142 msec  Recent Labs: 02/22/2021: B Natriuretic Peptide 480.0 05/03/2021: ALT 9 05/07/2021: BUN 15; Creatinine, Ser 0.95; Hemoglobin 9.5; Magnesium 2.6; Platelets 494; Potassium 4.1; Sodium 135  Personally reviewed   Wt Readings from Last 3 Encounters:  06/19/21 103.9 kg (229 lb)  05/09/21 100.8 kg (222 lb 3.6 oz)  03/17/21 102.5 kg (226 lb)    ASSESSMENT AND PLAN:  1. Chronic systolic/diastolic HF due to NICM (?LBBB) - Echo (718): EF 45%. Cath with minimal CAD - Echo (2/20): EF 25-30%, global HK, RV normal function, increased wall thickness, LA moderately dilated, RA severely dilated - R/LHC (2/20): with minimal non-obstructive CAD, NICM EF 30-35%, moderate pulmonary HTN due primarily to OSA/OHS, and relative well-compensated volume status, CI 2.6 - Echo (6/20): EF 25-30%. He has declined ICD. We discussed this again today and he is adamant that he does not want a device - Echo (9/22): EF 70-75% - NYHA II symptoms, volume looks good today. - Restart Jardiance 10 mg daily. - Decrease Lasix to 40 mg daily. - Continue Entresto 49/51 mg bid. - Continue spiro 25 mg daily. - Off beta blocker with wenckebach  - Previously followed by HF paramedicine, unclear if he was discharged. - BMET today, repeat in 10-14 days.  2. COPD - History of chronic CO2 retainer, needs NIV 2/2 chronic CO2 retention and hypoxia. - Continue Symbicort. - He is followed by Pulmonary. He needs follow up.  3. Severe OSA - Severe OSA by PSG 10/31/18 AHI 65 - Started outpatient BiPap 10/22, Trilogy was declined. - No longer wearing CPAP, I urged him to wear  nightly. - He has follow up with Dr. Elsworth Soho.   4. Mild nonobstructive CAD - On LHC 08/04/18 - no CP. - Continue ASA 81 mg daily.  - Continue Lipitor 40 mg daily.  5. LBBB - Previously referred to EP for potential CRT-D but he declined. - EF now out of range for device.  6. Obesity - Body mass index is 35.87 kg/m.  - Consider pharmacy referral for semaglutide.  Follow up with PharmD in 4 weeks (increase Entresto and change Lasix to PRN), APP in 8 weeks and Dr. Haroldine Laws in 4 months.  Frankey Poot, FNP  06/19/2021 9:00 AM   [facility] [address]  [facility] [place] [address] [phone] (office) [phone] (fax)

## 2021-06-19 ENCOUNTER — Encounter (HOSPITAL_COMMUNITY): Payer: Self-pay

## 2021-06-19 ENCOUNTER — Telehealth (HOSPITAL_COMMUNITY): Payer: Self-pay | Admitting: Pharmacy Technician

## 2021-06-19 ENCOUNTER — Other Ambulatory Visit: Payer: Self-pay

## 2021-06-19 ENCOUNTER — Ambulatory Visit (HOSPITAL_COMMUNITY)
Admission: RE | Admit: 2021-06-19 | Discharge: 2021-06-19 | Disposition: A | Discharge status: Home / Self Care | Payer: Medicare Other | Source: Ambulatory Visit | Attending: Family Medicine | Admitting: Family Medicine

## 2021-06-19 ENCOUNTER — Other Ambulatory Visit (HOSPITAL_COMMUNITY): Payer: Self-pay

## 2021-06-19 VITALS — BP 130/75 | HR 88 | Wt 229.0 lb

## 2021-06-19 DIAGNOSIS — I447 Left bundle-branch block, unspecified: Secondary | ICD-10-CM | POA: Diagnosis not present

## 2021-06-19 DIAGNOSIS — Z79899 Other long term (current) drug therapy: Secondary | ICD-10-CM | POA: Insufficient documentation

## 2021-06-19 DIAGNOSIS — I441 Atrioventricular block, second degree: Secondary | ICD-10-CM | POA: Diagnosis not present

## 2021-06-19 DIAGNOSIS — I110 Hypertensive heart disease with heart failure: Secondary | ICD-10-CM | POA: Insufficient documentation

## 2021-06-19 DIAGNOSIS — J441 Chronic obstructive pulmonary disease with (acute) exacerbation: Secondary | ICD-10-CM | POA: Diagnosis not present

## 2021-06-19 DIAGNOSIS — I428 Other cardiomyopathies: Secondary | ICD-10-CM | POA: Insufficient documentation

## 2021-06-19 DIAGNOSIS — Z7984 Long term (current) use of oral hypoglycemic drugs: Secondary | ICD-10-CM | POA: Insufficient documentation

## 2021-06-19 DIAGNOSIS — I272 Pulmonary hypertension, unspecified: Secondary | ICD-10-CM | POA: Diagnosis not present

## 2021-06-19 DIAGNOSIS — J449 Chronic obstructive pulmonary disease, unspecified: Secondary | ICD-10-CM

## 2021-06-19 DIAGNOSIS — I5042 Chronic combined systolic (congestive) and diastolic (congestive) heart failure: Secondary | ICD-10-CM | POA: Insufficient documentation

## 2021-06-19 DIAGNOSIS — Z7951 Long term (current) use of inhaled steroids: Secondary | ICD-10-CM | POA: Diagnosis not present

## 2021-06-19 DIAGNOSIS — Z7982 Long term (current) use of aspirin: Secondary | ICD-10-CM | POA: Diagnosis not present

## 2021-06-19 DIAGNOSIS — Z6835 Body mass index (BMI) 35.0-35.9, adult: Secondary | ICD-10-CM | POA: Diagnosis not present

## 2021-06-19 DIAGNOSIS — I2510 Atherosclerotic heart disease of native coronary artery without angina pectoris: Secondary | ICD-10-CM

## 2021-06-19 DIAGNOSIS — Z91199 Patient's noncompliance with other medical treatment and regimen due to unspecified reason: Secondary | ICD-10-CM | POA: Insufficient documentation

## 2021-06-19 DIAGNOSIS — E119 Type 2 diabetes mellitus without complications: Secondary | ICD-10-CM | POA: Diagnosis not present

## 2021-06-19 DIAGNOSIS — G4733 Obstructive sleep apnea (adult) (pediatric): Secondary | ICD-10-CM

## 2021-06-19 DIAGNOSIS — E662 Morbid (severe) obesity with alveolar hypoventilation: Secondary | ICD-10-CM | POA: Diagnosis not present

## 2021-06-19 LAB — BASIC METABOLIC PANEL
Anion gap: 16 — ABNORMAL HIGH (ref 5–15)
BUN: 10 mg/dL (ref 8–23)
CO2: 29 mmol/L (ref 22–32)
Calcium: 9.8 mg/dL (ref 8.9–10.3)
Chloride: 93 mmol/L — ABNORMAL LOW (ref 98–111)
Creatinine, Ser: 1.05 mg/dL (ref 0.61–1.24)
GFR, Estimated: >60 mL/min (ref >60)
Glucose, Bld: 99 mg/dL (ref 70–99)
Potassium: 3.8 mmol/L (ref 3.5–5.1)
Sodium: 138 mmol/L (ref 135–145)

## 2021-06-19 MED ORDER — FUROSEMIDE 40 MG PO TABS: 40.0000 mg | ORAL_TABLET | Freq: Every day | ORAL | 1 refills | Status: DC

## 2021-06-19 MED ORDER — EMPAGLIFLOZIN 10 MG PO TABS: 10.0000 mg | ORAL_TABLET | Freq: Every day | ORAL | 4 refills | Status: DC

## 2021-06-19 NOTE — Patient Instructions (Addendum)
Thank you for coming in today  Labs were done today, if any labs are abnormal the clinic will call you  Your physician recommends that you schedule a follow-up appointment in:  Pharmacy  in 4 weeks Clinic in 8 weeks Dr. Haroldine Laws 3 months  Your physician recommends that you schedule a follow-up appointment in: 10-14 days   RESTART Jardiance 10 mg 1 tablet daily  Member ID: [id number] Group ID: [id number] RxBin ID: [id number] PCN: [id number] Eligibility Start Date: 11/29/2020 Eligibility End Date: 02/26/2022 Assistance Amount: $1,000.00  DECREASE Lasix to 40 mg 1 tablet daily    At the [facility], you and your health needs are our priority. As part of our continuing mission to provide you with exceptional heart care, we have created designated Provider Care Teams. These Care Teams include your primary Cardiologist (physician) and Advanced Practice Providers (APPs- Physician Assistants and Nurse Practitioners) who all work together to provide you with the care you need, when you need it.   You may see any of the following providers on your designated Care Team at your next follow up: Dr Glori Bickers Dr Haynes Kerns, NP Lyda Jester, [place] [facility] [place], [place] Audry Riles, PharmD   Please be sure to bring in all your medications bottles to every appointment.   If you have any questions or concerns before your next appointment please send [place] a message through [place] or call our office at [phone].    TO LEAVE A MESSAGE FOR THE NURSE SELECT OPTION 2, PLEASE LEAVE A MESSAGE INCLUDING: YOUR NAME DATE OF BIRTH CALL BACK NUMBER REASON FOR CALL**this is important as we prioritize the call backs  YOU WILL RECEIVE A CALL BACK THE SAME DAY AS LONG AS YOU CALL BEFORE 4:00 PM     .

## 2021-06-19 NOTE — Telephone Encounter (Signed)
Advanced Heart Failure Patient Advocate Encounter  Patient was seen in clinic today and restarted on Jardiance. Current 30 day co-pay, $47. The patient has a current PAN HF grant that is still active but is set to expire on 01/17 due to inactivity. The patient also did not return the [facility] assistance renewal application. Since his current PAN grant has a balance of $953 I advised the patient use the grant for both medications. Tiffany Banker) is going to provide him another copy of the grant to take to the pharmacy with him.  Archer Asa, CPhT

## 2021-06-22 ENCOUNTER — Encounter (HOSPITAL_BASED_OUTPATIENT_CLINIC_OR_DEPARTMENT_OTHER): Payer: Medicare Other | Admitting: Pulmonary Disease

## 2021-06-27 DIAGNOSIS — J45998 Other asthma: Secondary | ICD-10-CM | POA: Diagnosis not present

## 2021-06-27 DIAGNOSIS — G4731 Primary central sleep apnea: Secondary | ICD-10-CM | POA: Diagnosis not present

## 2021-06-28 DIAGNOSIS — I509 Heart failure, unspecified: Secondary | ICD-10-CM | POA: Diagnosis not present

## 2021-06-28 DIAGNOSIS — D509 Iron deficiency anemia, unspecified: Secondary | ICD-10-CM | POA: Diagnosis not present

## 2021-06-28 DIAGNOSIS — J449 Chronic obstructive pulmonary disease, unspecified: Secondary | ICD-10-CM | POA: Diagnosis not present

## 2021-06-29 ENCOUNTER — Inpatient Hospital Stay (HOSPITAL_COMMUNITY): Admission: RE | Admit: 2021-06-29 | Payer: Medicare Other | Source: Ambulatory Visit

## 2021-07-12 DIAGNOSIS — J449 Chronic obstructive pulmonary disease, unspecified: Secondary | ICD-10-CM | POA: Diagnosis not present

## 2021-07-12 DIAGNOSIS — J9622 Acute and chronic respiratory failure with hypercapnia: Secondary | ICD-10-CM | POA: Diagnosis not present

## 2021-07-17 NOTE — Progress Notes (Incomplete)
***In Progress***    Advanced Heart Failure Clinic Note   PCP:  Maurice Small, MD          Cardiologist:  Fransico Him, MD Primary HF: Dr Haroldine Laws  HPI:  Kristopher [name] is a 71 y.o. male with a history of obesity, HTN, DM2, Asthma, Chronic combined diastolic/systolic CHF, severe OSA (AHI 65) and NICM.    Admitted 2/10 - 08/08/2018 with mixed AECOPD and A/C combined CHF. Initially required intubation in the setting of resp failure. Had [facility] as below in the setting of new drop in EF to 25-30%. Medications titrated as tolerated. He was discharged to SNF with CPAP (also new for him). D/c weight 235 pounds.    Sleep study 10/31/18 with severe OSA (AHI 65). Now on CPAP w/ good compliance. Also w/ improved medication compliance w/ the help of paramedicine.    Echo 11/2018 and showed persistently low EF, ~ 25-30%. He was referred to EP for ICD but declined. Does not want device.    Admitted 02/2021 for acute hypercarbic respiratory failure (off CPAP x 2 months), AKI and possible sepsis. Echo 02/2021 EF 70-75%, hyperdynamic LV function, biatrial enlargement, mild MR, mild AI, RV okay. Diuresed with IV Lasix, beta blocker held with wenckebach, and discharged on 2L home oxygen and trilogy ventilator.   Admitted 04/2021 for COPD exacerbation and CAP. Treated with steroids and abx. He received IV diuresis and Jardiance was stopped at discharge.   He returned to clinic next on 06/19/21 for post hospitalization HF follow up. Had not been seen in clinic since 09/2019 and was doing well from CHF standpoint. Overall was feeling fine at most recent visit. He was not SOB with stairs or carrying groceries. Denied palpitations, CP, dizziness, edema, or PND/Orthopnea. Appetite ok. No fever or chills. Weight at home was 224 pounds. Reported taking all medications. He stopped wearing his CPAP. He has split sleep study scheduled and follow up with Pulmonary. He is not wearing oxygen.  Today he returns to HF clinic for  pharmacist medication titration. At last visit with MD Vania Rea was started and Lasix was decreased from BID to daily. Was scheduled for follow up BMET but did not complete.   Overall feeling ***. Dizziness, lightheadedness, fatigue:  Chest pain or palpitations:  How is your breathing?: *** SOB: Able to complete all ADLs. Activity level ***  Weight at home pounds. Takes furosemide/torsemide/bumex *** mg *** daily.  LEE PND/Orthopnea  Appetite *** Low-salt diet:   Physical Exam Cost/affordability of meds *** never re-submitted Entresto patient assistance? PAN grant expired? Looks like Rx were sent as print?   HF Medications: Entresto 49-51 mg BID Spironolactone 25 mg daily***? Jardiance 10 mg daily Lasix 40 mg daily Potassium chloride 10 mEq daily  Has the patient been experiencing any side effects to the medications prescribed?  {YES NO:22349}  Does the patient have any problems obtaining medications due to transportation or finances?   {YES NO:22349}  Understanding of regimen: {excellent/good/fair/poor:19665} Understanding of indications: {excellent/good/fair/poor:19665} Potential of compliance: {excellent/good/fair/poor:19665} Patient understands to avoid NSAIDs. Patient understands to avoid decongestants.    Pertinent Lab Values: 06/19/21: Serum creatinine 1.05, BUN 10, Potassium 3.8, Sodium 138  Vital Signs: Weight: *** (last clinic weight: 229 lb) Blood pressure: ***  Heart rate: ***   Assessment/Plan: 1. Chronic systolic/diastolic HF due to NICM (?LBBB) - Echo (718): EF 45%. Cath with minimal CAD - Echo (2/20): EF 25-30%, global HK, RV normal function, increased wall thickness, LA moderately dilated, RA severely  dilated - R/LHC (2/20): with minimal non-obstructive CAD, NICM EF 30-35%, moderate pulmonary HTN due primarily to OSA/OHS, and relative well-compensated volume status, CI 2.6 - Echo (6/20): EF 25-30%. He has declined ICD. *** We discussed this again  today and he is adamant that he does not want a device - Echo (9/22): EF 70-75% - NYHA II symptoms, volume looks good today. - Restart Jardiance 10 mg daily.  - Decrease Lasix to 40 mg daily.  - Continue Entresto 49/51 mg bid.  - Continue spironolactone 25 mg daily. - Off beta blocker with wenckebach  - Previously followed by HF paramedicine, unclear if he was discharged. - BMET today, repeat in 10-14 days.  2. COPD - History of chronic CO2 retainer, needs NIV 2/2 chronic CO2 retention and hypoxia. - Continue Symbicort. - He is followed by Pulmonary. He needs follow up.   3. Severe OSA - Severe OSA by PSG 10/31/18 AHI 65 - Started outpatient BiPap 10/22, Trilogy was declined. - No longer wearing CPAP, I urged him to wear nightly. - He has follow up with Dr. Elsworth Soho.   4. Mild nonobstructive CAD - On LHC 08/04/18 - no CP. - Continue ASA 81 mg daily.  - Continue Lipitor 40 mg daily.   5. LBBB - Previously referred to EP for potential CRT-D but he declined. - EF now out of range for device.   6. Obesity - Body mass index is 35.87 kg/m.  - Consider pharmacy referral for semaglutide.   Follow up APP clinic 08/15/21.    Audry Riles, PharmD, BCPS, BCCP, CPP Heart Failure Clinic Pharmacist [phone]

## 2021-07-19 ENCOUNTER — Other Ambulatory Visit: Payer: Self-pay

## 2021-07-19 ENCOUNTER — Ambulatory Visit (HOSPITAL_COMMUNITY)
Admission: RE | Admit: 2021-07-19 | Discharge: 2021-07-19 | Disposition: A | Discharge status: Home / Self Care | Payer: Medicare Other | Source: Ambulatory Visit | Attending: Internal Medicine | Admitting: Internal Medicine

## 2021-07-19 ENCOUNTER — Telehealth (HOSPITAL_COMMUNITY): Payer: Self-pay | Admitting: Student-PharmD

## 2021-07-19 VITALS — BP 122/68 | HR 88 | Wt 239.0 lb

## 2021-07-19 DIAGNOSIS — E119 Type 2 diabetes mellitus without complications: Secondary | ICD-10-CM | POA: Diagnosis not present

## 2021-07-19 DIAGNOSIS — I447 Left bundle-branch block, unspecified: Secondary | ICD-10-CM | POA: Diagnosis not present

## 2021-07-19 DIAGNOSIS — Z79899 Other long term (current) drug therapy: Secondary | ICD-10-CM | POA: Diagnosis not present

## 2021-07-19 DIAGNOSIS — I5022 Chronic systolic (congestive) heart failure: Secondary | ICD-10-CM

## 2021-07-19 DIAGNOSIS — I110 Hypertensive heart disease with heart failure: Secondary | ICD-10-CM | POA: Diagnosis not present

## 2021-07-19 DIAGNOSIS — Z6837 Body mass index (BMI) 37.0-37.9, adult: Secondary | ICD-10-CM | POA: Insufficient documentation

## 2021-07-19 DIAGNOSIS — J449 Chronic obstructive pulmonary disease, unspecified: Secondary | ICD-10-CM | POA: Diagnosis not present

## 2021-07-19 DIAGNOSIS — G4733 Obstructive sleep apnea (adult) (pediatric): Secondary | ICD-10-CM | POA: Insufficient documentation

## 2021-07-19 DIAGNOSIS — I428 Other cardiomyopathies: Secondary | ICD-10-CM | POA: Insufficient documentation

## 2021-07-19 DIAGNOSIS — I2510 Atherosclerotic heart disease of native coronary artery without angina pectoris: Secondary | ICD-10-CM | POA: Diagnosis not present

## 2021-07-19 DIAGNOSIS — E669 Obesity, unspecified: Secondary | ICD-10-CM | POA: Insufficient documentation

## 2021-07-19 DIAGNOSIS — I5042 Chronic combined systolic (congestive) and diastolic (congestive) heart failure: Secondary | ICD-10-CM | POA: Diagnosis not present

## 2021-07-19 LAB — BASIC METABOLIC PANEL
Anion gap: 8 (ref 5–15)
BUN: 9 mg/dL (ref 8–23)
CO2: 31 mmol/L (ref 22–32)
Calcium: 9.1 mg/dL (ref 8.9–10.3)
Chloride: 97 mmol/L — ABNORMAL LOW (ref 98–111)
Creatinine, Ser: 1.13 mg/dL (ref 0.61–1.24)
GFR, Estimated: >60 mL/min (ref >60)
Glucose, Bld: 109 mg/dL — ABNORMAL HIGH (ref 70–99)
Potassium: 4.3 mmol/L (ref 3.5–5.1)
Sodium: 136 mmol/L (ref 135–145)

## 2021-07-19 LAB — BRAIN NATRIURETIC PEPTIDE: B Natriuretic Peptide: 84 pg/mL (ref 0.0–100.0)

## 2021-07-19 MED ORDER — SPIRONOLACTONE 25 MG PO TABS: 25.0000 mg | ORAL_TABLET | Freq: Every day | ORAL | 3 refills | Status: AC

## 2021-07-19 MED ORDER — PANTOPRAZOLE SODIUM 40 MG PO TBEC: 40.0000 mg | DELAYED_RELEASE_TABLET | Freq: Every day | ORAL | 3 refills | Status: DC

## 2021-07-19 MED ORDER — ATORVASTATIN CALCIUM 40 MG PO TABS: 40.0000 mg | ORAL_TABLET | Freq: Every day | ORAL | 3 refills | Status: DC

## 2021-07-19 MED ORDER — SACUBITRIL-VALSARTAN 49-51 MG PO TABS: 1.0000 | ORAL_TABLET | Freq: Two times a day (BID) | ORAL | 11 refills | Status: DC

## 2021-07-19 MED ORDER — SPIRONOLACTONE 25 MG PO TABS: 25.0000 mg | ORAL_TABLET | Freq: Every day | ORAL | 3 refills | Status: DC

## 2021-07-19 MED ORDER — FUROSEMIDE 40 MG PO TABS: 80.0000 mg | ORAL_TABLET | Freq: Two times a day (BID) | ORAL | 5 refills | Status: DC

## 2021-07-19 NOTE — Telephone Encounter (Signed)
[facility] as the patient's grant was canceled due to inactivity. Was able to get the grant reinstated.   Member ID: [id number] Group ID: [id number] RxBin ID: [id number] PCN: [id number] Eligibility Start Date: 11/29/2020 Eligibility End Date: 02/26/2022  Called patient's pharmacy who were able to successfully run the grant information for Kristopher [name] and bring the cost to $0. Left a message for the patient to let him know that he does not need to bring his income documentation to the clinic tomorrow since we will not need to apply for patient assistance. Instead he can pick up Entresto from his pharmacy for $0.

## 2021-07-19 NOTE — Progress Notes (Signed)
Advanced Heart Failure Clinic Note   PCP:  Maurice Small, MD          Cardiologist:  Fransico Him, MD Primary HF: Dr Haroldine Laws  HPI:  Kristopher [name] is a 71 y.o. male with a history of obesity, HTN, DM2, Asthma, Chronic combined diastolic/systolic CHF, severe OSA (AHI 65) and NICM.   Admitted 2/10 - 08/08/2018 with mixed AECOPD and A/C combined CHF. Initially required intubation in the setting of resp failure. Had [facility] as below in the setting of new drop in EF to 25-30%. Medications titrated as tolerated. He was discharged to SNF with CPAP (also new for him). D/c weight 235 pounds.   Sleep study 10/31/18 with severe OSA (AHI 65). Now on CPAP w/ good compliance. Also w/ improved medication compliance w/ the help of paramedicine.    Echo 11/2018 and showed persistently low EF, ~25-30%. He was referred to EP for ICD but declined. Does not want device.    Admitted 02/2021 for acute hypercarbic respiratory failure (off CPAP x 2 months), AKI and possible sepsis. Echo 02/2021 EF 70-75%, hyperdynamic LV function, biatrial enlargement, mild MR, mild AI, RV okay. Diuresed with IV Lasix, beta blocker held with wenckebach, and discharged on 2L home oxygen and trilogy ventilator.   Admitted 04/2021 for COPD exacerbation and CAP. Treated with steroids and abx. He received IV diuresis and Jardiance was stopped at discharge.   He returned to clinic next on 06/19/21 for post hospitalization HF follow up. Had not been seen in clinic since 09/2019 and was doing well from CHF standpoint. Overall was feeling fine at most recent visit. He was not SOB with stairs or carrying groceries. Denied palpitations, CP, dizziness, edema, or PND/Orthopnea. Appetite ok. No fever or chills. Weight at home was 224 pounds. Reported taking all medications. He stopped wearing his CPAP. He has split sleep study scheduled and follow up with Pulmonary. He is not wearing oxygen.  Today he returns to HF clinic for pharmacist medication  titration. At last visit with APP, Jardiance was started and Lasix was decreased from BID to daily. Was scheduled for follow up BMET but patient did not complete. He reports he needs refills for atorvastatin and pantoprazole. He says he was told he didn't need to take Jardiance anymore so he stopped it. Unclear who he thinks told him this. He is not familiar with the name spironolactone and says he is not taking that either. When he went down to Lasix 40 mg BID he had swelling so he has been taking 80 mg BID. He has taken all medications today except Lasix. Overall feeling good. Denies dizziness, lightheadedness, fatigue, chest pain, palpitations. Denies SOB with walking or with a flight of stairs. He is able to run errands for multiple hours without getting tired or SOB. Weight at home is up after the holidays due to having a bigger appetite but has been stable around 231 lbs. Says he has been eating a little more salt lately and that he knows he needs to stop since his weight is up today. No LEE, PND, orthopnea. He has not been using his CPAP because he says "it was choking him" and he canceled his upcoming appt with sleep medicine because "he is not a [place] pig" and doesn't want to restart CPAP.   HF Medications: Entresto 49-51 mg BID Spironolactone 25 mg daily (not taking) Jardiance 10 mg daily (not taking) Lasix 40 mg daily (taking 80 mg BID) Potassium chloride 10 mEq  daily (unclear if taking)  Has the patient been experiencing any side effects to the medications prescribed?  No  Does the patient have any problems obtaining medications due to transportation or finances?  PAN grant recently expired due to inactivity. He has 1 month supply of Entresto remaining at home and states he needs to fill out the paperwork to get more of it. He signed paperwork for [facility] patient assistance today. We were able to reactivate his PAN grant today for [employer].   Understanding of regimen: poor Understanding  of indications: fair Potential of compliance: fair Patient understands to avoid NSAIDs. Patient understands to avoid decongestants.   Pertinent Lab Values: 07/19/21: Serum creatinine 1.13, BUN 9, Potassium 4.3 Sodium 136, BNP 84  Vital Signs: Weight: 239 lbs (last clinic weight: 229 lbs) Blood pressure: 122/68  Heart rate: 88   Assessment/Plan: 1. Chronic systolic/diastolic HF due to NICM (?LBBB) - Echo (12/2016): EF 45%. Cath with minimal CAD - Echo (07/2018): EF 25-30%, global HK, RV normal function, increased wall thickness, LA moderately dilated, RA severely dilated - R/LHC (07/2018): with minimal non-obstructive CAD, NICM EF 30-35%, moderate pulmonary HTN due primarily to OSA/OHS, and relative well-compensated volume status, CI 2.6 - Echo (11/2018): EF 25-30%. He declined ICD. - Echo (02/2021): EF 70-75% - NYHA II symptoms. Weight is up 10 lbs from last visit but euvolemic on exam and BNP today is normal (84.0 pg/mL).  - Continue Lasix 80 mg BID. Updated medication list to reflect how patient is taking.  - Off beta blocker with Wenckebach  - Continue Entresto 49/51 mg BID. Will apply for patient assistance.  - Restart spironolactone 25 mg daily. Stop taking potassium supplement. Repeat BMET in 1 week. - Hold off on restarting Jardiance today given multiple medication changes and patient confusion over regimen. - Previously followed by HF paramedicine but has been discharged.  - BMET today is stable, repeat in 1 week.  2. COPD - History of chronic CO2 retainer, needs NIV 2/2 chronic CO2 retention and hypoxia. - Continue Symbicort. - He is followed by Pulmonary. He needs follow up.   3. Severe OSA - Severe OSA by PSG 10/31/18 AHI 65 - Started outpatient BiPap 10/22, Trilogy was declined. - No longer wearing CPAP, encouraged him to wear nightly and reschedule with sleep medicine.   4. Mild nonobstructive CAD - On LHC 08/04/18 - No CP. - Continue ASA 81 mg daily.  - Continue  atorvastatin 40 mg daily.   5. LBBB - Previously referred to EP for potential CRT-D but he declined. - EF now out of range for device.   6. Obesity - Body mass index is 37.43 kg/m.  - Consider pharmacy referral for semaglutide.   Follow up APP clinic 08/15/21.    Audry Riles, PharmD, BCPS, BCCP, CPP Heart Failure Clinic Pharmacist [phone]

## 2021-07-19 NOTE — Patient Instructions (Addendum)
It was a pleasure seeing you today!  MEDICATIONS: -We are changing your medications today -Start spironolactone 25 mg daily -Stop taking potassium -Call if you have questions about your medications.  LABS: -We will call you if your labs need attention.  NEXT APPOINTMENT: Return to clinic in 1 week for labs. Bring your income statements at that time.   In general, to take care of your heart failure: -Limit your fluid intake to 2 Liters (half-gallon) per day.   -Limit your salt intake to ideally 2-3 grams (2000-3000 mg) per day. -Weigh yourself daily and record, and bring that "weight diary" to your next appointment.  (Weight gain of 2-3 pounds in 1 day typically means fluid weight.) -The medications for your heart are to help your heart and help you live longer.   -Please contact us before stopping any of your heart medications.  Call the clinic at [phone] with questions or to reschedule future appointments.

## 2021-07-20 DIAGNOSIS — I5042 Chronic combined systolic (congestive) and diastolic (congestive) heart failure: Secondary | ICD-10-CM | POA: Diagnosis not present

## 2021-07-20 DIAGNOSIS — J449 Chronic obstructive pulmonary disease, unspecified: Secondary | ICD-10-CM | POA: Diagnosis not present

## 2021-07-20 DIAGNOSIS — E1159 Type 2 diabetes mellitus with other circulatory complications: Secondary | ICD-10-CM | POA: Diagnosis not present

## 2021-07-20 DIAGNOSIS — I10 Essential (primary) hypertension: Secondary | ICD-10-CM | POA: Diagnosis not present

## 2021-07-20 DIAGNOSIS — J441 Chronic obstructive pulmonary disease with (acute) exacerbation: Secondary | ICD-10-CM | POA: Diagnosis not present

## 2021-07-21 ENCOUNTER — Encounter (HOSPITAL_BASED_OUTPATIENT_CLINIC_OR_DEPARTMENT_OTHER): Payer: Medicare Other | Admitting: Pulmonary Disease

## 2021-07-26 ENCOUNTER — Other Ambulatory Visit (HOSPITAL_COMMUNITY): Payer: Medicare Other

## 2021-07-28 DIAGNOSIS — G4731 Primary central sleep apnea: Secondary | ICD-10-CM | POA: Diagnosis not present

## 2021-07-28 DIAGNOSIS — J45998 Other asthma: Secondary | ICD-10-CM | POA: Diagnosis not present

## 2021-08-05 NOTE — Progress Notes (Unsigned)
11/25/2018- 89 yoM former smoker for sleep evaluation. Medical problem list includes Pulmonary HTN, LBBB, HBP, Dilated cardiomyopathy, s&dCHF, OSA, Peptic Ulcer Disease/ esophagitis, Morbid obesity  Sleep study 10/28/2018-  home sleep study revealed Severe Obstructive Sleep Apnea with AHI 65.8/hr. Central sleep apnea was noted with an pAHIc of 11.4/hr. % Cheyne Stokes respirations was 10.3%. Nocturnal Hypoxemia was noted with O2 saturations as low as 72%. Time spent with Oxygen desaturations < 88% was 97.6 minutes.I -----Had home sleep study and is now getting established to receive a CPAP. He was on CPAP therapy while is rehab at [facility] but does not have one of his own in the home.  Body weight today 240 lbs Epworth score 8 He was on CPAP and liked it while at [facility], but could not take it home with him and needs to establish DME. [facility]. Son on CPAP.   08/08/21- 70 yoM former smoker  followed for OSA, Chronic Hypoxic Respiratory Failure, COPD, complicated by  Pulmonary HTN, LBBB, HBP, Dilated cardiomyopathy, s&dCHF, OSA, Peptic Ulcer Disease/ esophagitis, Morbid obesity -Symbicort 160, Neb albuterol,  BIPAP /Adapt> ? Trilogy NIV w IVAPS Download- Body weight today- Covid vax- Flu vax- Hosp 11/23-11/29/22- COPD Exacerb, Hypoxemia, CHF, > ordered Trilogy NIV after failure of BIPAP to prevent readmission PFT and sleep study are ordered future  CXR 05/03/21-  IMPRESSION: Low lung volumes with mild bibasilar atelectasis.  ROS-see HPI   + =positive Constitutional:    weight loss, night sweats, fevers, chills, fatigue, lassitude. HEENT:    headaches, difficulty swallowing, tooth/dental problems, sore throat,       Sneezing,+ itching, ear ache, nasal congestion, post nasal drip, snoring CV:    chest pain, orthopnea, PND, swelling in lower extremities, anasarca,                                   dizziness, palpitations Resp:   shortness of breath with exertion or at rest.                 productive cough,   non-productive cough, coughing up of blood.              change in color of mucus.  wheezing.   Skin:    rash or lesions. GI:  No-   heartburn, indigestion, abdominal pain, nausea, vomiting, diarrhea,                 change in bowel habits, loss of appetite GU: dysuria, change in color of urine, no urgency or frequency.   flank pain. MS:   joint pain, stiffness, decreased range of motion, back pain. Neuro-     nothing unusual Psych:  change in mood or affect.  depression or anxiety.   memory loss.  OBJ- Physical Exam General- Alert, Oriented, Affect-appropriate, Distress- none acute, + obese Skin- rash-none, lesions- none, excoriation- none Lymphadenopathy- none Head- atraumatic            Eyes- Gross vision intact, PERRLA, conjunctivae and secretions clear            Ears- Hearing, canals-normal            Nose- Clear, no-Septal dev, mucus, polyps, erosion, perforation             Throat- Mallampati IV , mucosa clear , drainage- none, tonsils- atrophic Neck- flexible , trachea midline, no stridor , thyroid nl, carotid no bruit Chest - symmetrical  excursion , unlabored           Heart/CV- RRR , no murmur , no gallop  , no rub, nl s1 s2                           - JVD+1 , edema- none, stasis changes- none, varices- none           Lung- +diminished, wheeze- none, cough- none , dullness-none, rub- none           Chest wall-  Abd-  Br/ Gen/ Rectal- Not done, not indicated Extrem- cyanosis- none, clubbing, none, atrophy- none, strength- nl Neuro- grossly intact to observation

## 2021-08-08 ENCOUNTER — Ambulatory Visit: Payer: Medicare Other | Admitting: Internal Medicine

## 2021-08-09 DIAGNOSIS — J449 Chronic obstructive pulmonary disease, unspecified: Secondary | ICD-10-CM | POA: Diagnosis not present

## 2021-08-09 DIAGNOSIS — J9622 Acute and chronic respiratory failure with hypercapnia: Secondary | ICD-10-CM | POA: Diagnosis not present

## 2021-08-15 ENCOUNTER — Ambulatory Visit (HOSPITAL_COMMUNITY)
Admission: RE | Admit: 2021-08-15 | Discharge: 2021-08-15 | Disposition: A | Discharge status: Home / Self Care | Payer: Medicare Other | Source: Ambulatory Visit | Attending: Family Medicine | Admitting: Family Medicine

## 2021-08-15 ENCOUNTER — Encounter (HOSPITAL_COMMUNITY): Payer: Self-pay

## 2021-08-15 ENCOUNTER — Other Ambulatory Visit: Payer: Self-pay

## 2021-08-15 VITALS — BP 112/68 | HR 96 | Wt 238.0 lb

## 2021-08-15 DIAGNOSIS — Z7901 Long term (current) use of anticoagulants: Secondary | ICD-10-CM | POA: Diagnosis not present

## 2021-08-15 DIAGNOSIS — I272 Pulmonary hypertension, unspecified: Secondary | ICD-10-CM | POA: Insufficient documentation

## 2021-08-15 DIAGNOSIS — I447 Left bundle-branch block, unspecified: Secondary | ICD-10-CM | POA: Insufficient documentation

## 2021-08-15 DIAGNOSIS — I2510 Atherosclerotic heart disease of native coronary artery without angina pectoris: Secondary | ICD-10-CM | POA: Diagnosis not present

## 2021-08-15 DIAGNOSIS — I110 Hypertensive heart disease with heart failure: Secondary | ICD-10-CM | POA: Diagnosis not present

## 2021-08-15 DIAGNOSIS — I420 Dilated cardiomyopathy: Secondary | ICD-10-CM | POA: Diagnosis not present

## 2021-08-15 DIAGNOSIS — I5042 Chronic combined systolic (congestive) and diastolic (congestive) heart failure: Secondary | ICD-10-CM | POA: Insufficient documentation

## 2021-08-15 DIAGNOSIS — Z7982 Long term (current) use of aspirin: Secondary | ICD-10-CM | POA: Diagnosis not present

## 2021-08-15 DIAGNOSIS — G4733 Obstructive sleep apnea (adult) (pediatric): Secondary | ICD-10-CM | POA: Insufficient documentation

## 2021-08-15 DIAGNOSIS — Z7984 Long term (current) use of oral hypoglycemic drugs: Secondary | ICD-10-CM | POA: Insufficient documentation

## 2021-08-15 DIAGNOSIS — Z6837 Body mass index (BMI) 37.0-37.9, adult: Secondary | ICD-10-CM | POA: Diagnosis not present

## 2021-08-15 DIAGNOSIS — E119 Type 2 diabetes mellitus without complications: Secondary | ICD-10-CM | POA: Insufficient documentation

## 2021-08-15 DIAGNOSIS — J449 Chronic obstructive pulmonary disease, unspecified: Secondary | ICD-10-CM

## 2021-08-15 DIAGNOSIS — Z79899 Other long term (current) drug therapy: Secondary | ICD-10-CM | POA: Diagnosis not present

## 2021-08-15 DIAGNOSIS — J441 Chronic obstructive pulmonary disease with (acute) exacerbation: Secondary | ICD-10-CM | POA: Insufficient documentation

## 2021-08-15 LAB — BASIC METABOLIC PANEL
Anion gap: 7 (ref 5–15)
BUN: 12 mg/dL (ref 8–23)
CO2: 30 mmol/L (ref 22–32)
Calcium: 8.9 mg/dL (ref 8.9–10.3)
Chloride: 98 mmol/L (ref 98–111)
Creatinine, Ser: 1.12 mg/dL (ref 0.61–1.24)
GFR, Estimated: >60 mL/min (ref >60)
Glucose, Bld: 100 mg/dL — ABNORMAL HIGH (ref 70–99)
Potassium: 4 mmol/L (ref 3.5–5.1)
Sodium: 135 mmol/L (ref 135–145)

## 2021-08-15 LAB — BRAIN NATRIURETIC PEPTIDE: B Natriuretic Peptide: 101.5 pg/mL — ABNORMAL HIGH (ref 0.0–100.0)

## 2021-08-15 MED ORDER — EMPAGLIFLOZIN 10 MG PO TABS: 10.0000 mg | ORAL_TABLET | Freq: Every day | ORAL | 4 refills | Status: AC

## 2021-08-15 NOTE — Progress Notes (Signed)
?Heart and Vascular Care Navigation ? ?08/15/2021 ? ?Tasman H Lardizabal ?1950/06/24 ?[id number] ? ?Reason for Referral: paramedicine enrollment ?  ?Engaged with patient face to face for initial visit for Heart and Vascular Care Coordination. ?                                                                                                  ?Assessment:   Pt was with [employer] program previously and is now needing re-enrollment due to issues with medications and need for further HF education.  Pt in agreement to being seen again by paramedic.    ? ? ?Paramedicine Initial Assessment: ? ?Housing:  ?In what kind of housing do you live? House/apt/trailer/shelter? house ? ?Do you rent/pay a mortgage/own? Doesn't pay rent- has been helping with maintenance in exchange for staying there ? ?Do you live with anyone? Lives with his cousin ? ?Are you currently worried about losing your housing? no ? ?Social:  ? ?Do you have any children?two dtrs who live locally and son who is long distance trucker so isnt around for long periods of time but lives locally ? ?Income:  ?What is your current source of income? Retirement income- around $84  and pension for about $500 ? ?Insurance:  ?Are you currently insured? Yes- UHC Medicare ? ? ?Transportation:  ?Do you have transportation to your medical appointments? Has own car so no concerns. ? ? Daily Health Needs: ?Do you have a working scale at home? yes ? ?How do you manage your medications at home? Take them out of the bottle ? ?Do you ever take your medications differently than prescribed? Yes sometimes won't take lasix as recommended to avoid going to the bathroom a lot when he's out ? ?Do you have issues affording your medications? Not at this time- has PAN grant for [employer] ? ?Do you have any concerns with mobility at home? no ? ?Do you use any assistive devices at home or have PCS at home? Has cane and walker but doesn't use them ? ?Do you have a PCP? Yes see  see Dr. Clyde Canterbury after Dr. Shirlean Mylar retired ? ?Do you have any trouble reading or writing? no ? ?Are there any additional barriers you see to getting the care you need? Not at this time- feeling well since November hospital stay. ? ?                               ? ?HRT/VAS Care Coordination   ? ? Outpatient Care Team Community Paramedicine  ? Community Paramedic Name: [facility] 03/03/20 as pt no longer needs assistance  ? Social Worker Name: Rosetta Posner- Advanced [facility]- [phone]  ? Living arrangements for the past 2 months Single Family Home  ? Lives with: Relatives  ? Home Assistive Devices/Equipment Grab bars in shower  ? DME Agency [employer]  ? HH Agency NA  ? Current home services DME  ? ?  ? ? ?Social History:                                                                             ?  SDOH Screenings  ? ?Alcohol Screen: Not on file  ?Depression (PHQ2-9): Not on file  ?Financial Resource Strain: Not on file  ?Food Insecurity: No Food Insecurity  ? Worried About Programme researcher, broadcasting/film/video in the Last Year: Never true  ? Ran Out of Food in the Last Year: Never true  ?Housing: Low Risk   ? Last Housing Risk Score: 0  ?Physical Activity: Not on file  ?Social Connections: Not on file  ?Stress: Not on file  ?Tobacco Use: Medium Risk  ? Smoking Tobacco Use: Former  ? Smokeless Tobacco Use: Never  ? Passive Exposure: Not on file  ?Transportation Needs: No Transportation Needs  ? Lack of Transportation (Medical): No  ? Lack of Transportation (Non-Medical): No  ? ? ?SDOH Interventions: ?Financial Resources:    ?Gets retirement income and pension  ?Food Insecurity:  Food Insecurity Interventions: Intervention Not Indicated  ?Housing Insecurity:   none  ?Transportation:   Transportation Interventions: Intervention Not Indicated  ? ? ?Follow-up plan:   ? ?CSW to send referral to paramedics for assignment ? ?[name], LCSW ?Clinical Social Worker ?Advanced Heart Failure Clinic ?Desk#: [phone] ?Cell#:  [phone] ? ? ? ?

## 2021-08-15 NOTE — Addendum Note (Signed)
Encounter addended by: [name], LCSW on: 08/15/2021 11:04 AM ? Actions taken: Flowsheet accepted

## 2021-08-15 NOTE — Patient Instructions (Signed)
Thank you for coming in today ? ?Labs were done today, if any labs are abnormal the clinic will call you ? ?RESTART Jardiance 10 mg 1 tablet daily ? ?You have been referred to paramedicine ? ?Your physician recommends that you schedule a follow-up appointment in:  ?Keep follow up appointment with Dr. Gala Romney ? ?At the [facility] Clinic, you and your health needs are our priority. As part of our continuing mission to provide you with exceptional heart care, we have created designated Provider Care Teams. These Care Teams include your primary Cardiologist (physician) and Advanced Practice Providers (APPs- Physician Assistants and Nurse Practitioners) who all work together to provide you with the care you need, when you need it.  ? ?You may see any of the following providers on your designated Care Team at your next follow up: ?Dr Arvilla Meres ?Dr Marca Ancona ?Tonye Becket, NP ?Robbie Lis, PA ?Jessica Milford,NP ?Anna Genre, PA ?Karle Plumber, PharmD ? ? ?Please be sure to bring in all your medications bottles to every appointment.  ? ?If you have any questions or concerns before your next appointment please send [place] a message through [place] or call our office at [phone].   ? ?TO LEAVE A MESSAGE FOR THE NURSE SELECT OPTION 2, PLEASE LEAVE A MESSAGE INCLUDING: ?YOUR NAME ?DATE OF BIRTH ?CALL BACK NUMBER ?REASON FOR CALL**this is important as we prioritize the call backs ? ?YOU WILL RECEIVE A CALL BACK THE SAME DAY AS LONG AS YOU CALL BEFORE 4:00 PM ? ?

## 2021-08-15 NOTE — Progress Notes (Signed)
Advanced HF Clinic Note  ID:  Kristopher, [name] Dec 21, 1950, MRN [id number]  Location: Home  Provider location: [address], [place] Type of Visit: Established patient   PCP:  Maurice Small, MD  Cardiologist:  Fransico Him, MD HF: Dr Haroldine Laws  Chief Complaint: F/U for Chronic Systolic Heart Failure    History of Present Illness: Kristopher [name] is a 71 y.o. male with a history of obesity, HTN, DM2, Asthma, Chronic combined diastolic/systolic CHF, severe OSA (AHI 65) and NICM.    Admitted 2/10 - 08/08/2018 with mixed AECOPD and A/C combined CHF. Initially required intubation in the setting of resp failure. Had [facility] as below in the setting of new drop in EF to 25-30%. Medications titrated as tolerated. He was discharged to SNF with CPAP (also new for him). D/c weight 235 pounds   Sleep study 10/31/18 with severe OSA (AHI 65). Now on CPAP w/ good compliance. Also w/ improved medication compliance w/ the help of paramedicine.   Echo 6/20 and showed persistently low EF, ~ 25-30%. He was referred to EP for ICD but declined. Does not want device.   Admitted 9/22 for acute hypercarbic respiratory failure (off CPAP x 2 months), AKI and possible sepsis. Echo 9/22 EF 70-75%, hyperdynamic LV function, biatrial enlargement, mild MR, mild AI, RV okay. Diuresed with IV Lasix, beta blocker held with wenckebach, and discharged on 2L home oxygen and trilogy ventilator.  Admitted 11/22 for COPD exacerbation and CAP. Treated with steroids and abx. He received IV diuresis and Jardiance was stopped at discharge.  Post hospital follow up 1/23 he had not been seen in clinic since 4/21. Stable NYHA II symptoms and Jardiance restarted.  Today he returns for HF follow up. Overall feeling fine. He is not SOB vacuuming or doing laundry. Denies palpitations, abnormal bleeding, CP, dizziness, edema, or PND/Orthopnea. Appetite ok. No fever or chills. Weight at home 222 pounds. Been off  Jardiance > 1 month, thinks he never picked up from pharmacy. Not wearing CPAP, unwilling to re-try. No show for Pulmonary follow up last month.  Cardiac Studies  - Echo 9/22: EF 70-75%, mild LVH, normal RV  - R/LHC 2/20  Prox LAD lesion is 30% stenosed. Prox RCA to Mid RCA lesion is 30% stenosed. Prox Cx lesion is 30% stenosed.   Findings:   Ao = 107/69 (87)  LV =  119/13 RA =  5 RV = 64/11 PA = 71/23 (39) PCW = 21 Fick cardiac output/index = 5.6/2.6 PVR = 3.1 WU Ao sat = 94% PA sat = 64%, 65%   Assessment: 1. Minimal non-obstructive CAD 2. NICM EF 30-35% 3. Moderate pulmonary HTN due primarily to OSA/OHS    - Echo 6/20 EF 25-30%, moderate LVH, abnormal septal motion consistent w/ LBBB, trivial pericardial effusion.   Past Medical History:  Diagnosis Date   Acute combined systolic and diastolic heart failure ([place]) 08/11/2016   Acute esophagitis 08/17/2016   Acute upper GI bleed 08/10/2016   Asthma    CAD (coronary artery disease) 08/16/2016   CHF (congestive heart failure) (HCC)    COPD (chronic obstructive pulmonary disease) ([place]) 11/06/2019   DCM (dilated cardiomyopathy) ([place])    Dyslipidemia 11/06/2019   Dyspnea    HTN (hypertension)    LBBB (left bundle branch block) 08/10/2016   Lymphadenopathy 08/17/2016   OSA (obstructive sleep apnea) 11/18/2018  [facility] home sleep study revealed Severe Obstructive Sleep Apnea with AHI 65.8/hr. Central sleep apnea was noted with an pAHIc of 11.4/hr. % Cheyne Stokes respirations was 10.3%. Nocturnal Hypoxemia was noted with O2 saturations as low as 72%. Time spent with Oxygen desaturations < 88% was 97.6 minutes.   PUD (peptic ulcer disease) 08/17/2016   Pulmonary HTN ([place]) 08/17/2016   Past Surgical History:  Procedure Laterality Date   BIOPSY  11/07/2019   Procedure: BIOPSY;  Surgeon: Otis Brace, MD;  Location: [facility];  Service: Gastroenterology;;   BIOPSY  11/08/2019   Procedure: BIOPSY;  Surgeon: Otis Brace, MD;   Location: [facility];  Service: Gastroenterology;;   BIOPSY  02/25/2021   Procedure: BIOPSY;  Surgeon: Otis Brace, MD;  Location: [facility];  Service: Gastroenterology;;   COLONOSCOPY WITH PROPOFOL N/A 11/08/2019   Procedure: COLONOSCOPY WITH PROPOFOL;  Surgeon: Otis Brace, MD;  Location: [facility];  Service: Gastroenterology;  Laterality: N/A;   ESOPHAGOGASTRODUODENOSCOPY (EGD) WITH PROPOFOL Left 08/11/2016   Procedure: ESOPHAGOGASTRODUODENOSCOPY (EGD) WITH PROPOFOL;  Surgeon: Arta Silence, MD;  Location: [facility] ENDOSCOPY;  Service: Endoscopy;  Laterality: Left;   ESOPHAGOGASTRODUODENOSCOPY (EGD) WITH PROPOFOL Left 11/07/2019   Procedure: ESOPHAGOGASTRODUODENOSCOPY (EGD) WITH PROPOFOL;  Surgeon: Otis Brace, MD;  Location: [facility];  Service: Gastroenterology;  Laterality: Left;   ESOPHAGOGASTRODUODENOSCOPY (EGD) WITH PROPOFOL N/A 02/25/2021   Procedure: ESOPHAGOGASTRODUODENOSCOPY (EGD) WITH PROPOFOL;  Surgeon: Otis Brace, MD;  Location: [facility];  Service: Gastroenterology;  Laterality: N/A;   POLYPECTOMY  11/08/2019   Procedure: POLYPECTOMY;  Surgeon: Otis Brace, MD;  Location: [facility];  Service: Gastroenterology;;   RIGHT/LEFT HEART CATH AND CORONARY ANGIOGRAPHY N/A 08/15/2016   Procedure: Right/Left Heart Cath and Coronary Angiography;  Surgeon: Jettie Booze, MD;  Location: [facility];  Service: Cardiovascular;  Laterality: N/A;   RIGHT/LEFT HEART CATH AND CORONARY ANGIOGRAPHY N/A 08/04/2018   Procedure: RIGHT/LEFT HEART CATH AND CORONARY ANGIOGRAPHY;  Surgeon: Jolaine Artist, MD;  Location: [facility];  Service: Cardiovascular;  Laterality: N/A;   Current Outpatient Medications  Medication Sig Dispense Refill   acetaminophen (TYLENOL) 325 MG tablet Take 650 mg by mouth every 6 (six) hours as needed for mild pain.      albuterol (PROVENTIL) (2.5 MG/3ML) 0.083% nebulizer solution Inhale 3 mLs into the lungs in the morning  and at bedtime.     aspirin 81 MG chewable tablet Chew 1 tablet (81 mg total) by mouth daily. 30 tablet 6   atorvastatin (LIPITOR) 40 MG tablet Take 1 tablet (40 mg total) by mouth daily. 90 tablet 3   ferrous sulfate 325 (65 FE) MG tablet Take 1 tablet (325 mg total) by mouth daily with breakfast. 90 tablet 0   furosemide (LASIX) 40 MG tablet Take 2 tablets (80 mg total) by mouth 2 (two) times daily. 120 tablet 5   magnesium hydroxide (MILK OF MAGNESIA) 400 MG/5ML suspension Take 30 mLs by mouth See admin instructions. Tuesday and Thursday as needed     pantoprazole (PROTONIX) 40 MG tablet Take 1 tablet (40 mg total) by mouth daily. 90 tablet 3   sacubitril-valsartan (ENTRESTO) 49-51 MG Take 1 tablet by mouth 2 (two) times daily. 60 tablet 11   spironolactone (ALDACTONE) 25 MG tablet Take 1 tablet (25 mg total) by mouth daily. 90 tablet 3   SYMBICORT 160-4.5 MCG/ACT inhaler Inhale 1 puff into the lungs in the morning and at bedtime.     No current facility-administered medications for this encounter.  Allergies:   Penicillins   Social History:  The patient  reports that he quit smoking about 23 years ago. His smoking use included cigarettes. He has a 22.00 pack-year smoking history. He has never used smokeless tobacco. He reports that he does not currently use alcohol. He reports that he does not currently use drugs.   Family History:  The patient's family history includes Hypertension in his mother and sister.   ROS:  Please see the history of present illness.   All other systems are personally reviewed and negative.   BP 112/68    Pulse 96    Wt 108 kg (238 lb)    SpO2 93%    BMI 37.28 kg/m   PHYSICAL EXAM: General:  NAD. No resp difficulty HEENT: Normal Neck: Supple. Thick neck. Carotids 2+ bilat; no bruits. No lymphadenopathy or thryomegaly appreciated. Cor: PMI nondisplaced. Regular rate & rhythm. No rubs, gallops or murmurs. Lungs: Diminished throughout Abdomen: Obese,  nontender, nondistended. No hepatosplenomegaly. No bruits or masses. Good bowel sounds. Extremities: No cyanosis, clubbing, rash, edema Neuro: Alert & oriented x 3, cranial nerves grossly intact. Moves all 4 extremities w/o difficulty. Affect pleasant.  Recent Labs: 05/03/2021: ALT 9 05/07/2021: Hemoglobin 9.5; Magnesium 2.6; Platelets 494 07/19/2021: B Natriuretic Peptide 84.0; BUN 9; Creatinine, Ser 1.13; Potassium 4.3; Sodium 136  Personally reviewed   Wt Readings from Last 3 Encounters:  08/15/21 108 kg (238 lb)  07/19/21 108.4 kg (239 lb)  06/19/21 103.9 kg (229 lb)    ASSESSMENT AND PLAN: 1. Chronic systolic/diastolic HF due to NICM (?LBBB) - Echo (718): EF 45%. Cath with minimal CAD - Echo (2/20): EF 25-30%, global HK, RV normal function, increased wall thickness, LA moderately dilated, RA severely dilated - R/LHC (2/20): with minimal non-obstructive CAD, NICM EF 30-35%, moderate pulmonary HTN due primarily to OSA/OHS, and relative well-compensated volume status, CI 2.6 - Echo (6/20): EF 25-30%. He has declined ICD. We discussed this again today and he is adamant that he does not want a device - Echo (9/22): EF 70-75% - NYHA I-II symptoms, volume looks good today. - Restart Jardiance 10  mg daily. - Stop KCL suppl unless directed by AHF clinic. - Continue Lasix 80 mg bid. - Continue Entresto 49/51 mg bid. - Continue spiro 25 mg daily. - Off beta blocker with wenckebach  - Previously followed by HF paramedicine, unclear if he was discharged. Discussed again today, he is agreeable. Will arrange. - BMET & BNP today.  2. COPD - History of chronic CO2 retainer, needs NIV 2/2 chronic CO2 retention and hypoxia. - Continue Symbicort. - He is followed by Pulmonary. He needs follow up.  3. Severe OSA - Severe OSA by PSG 10/31/18 AHI 65 - Started outpatient BiPap 10/22, Trilogy was declined. - No longer wearing CPAP, not willing to re-try.   4. Mild nonobstructive CAD - On LHC  08/04/18 - no CP. - Continue ASA 81 mg daily.  - Continue Lipitor 40 mg daily.  5. LBBB - Previously referred to EP for potential CRT-D but he declined. - EF now out of range for device.  6. Obesity - Body mass index is 37.28 kg/m.  - Consider pharmacy referral for semaglutide.  Follow up next month with Dr. Haroldine Laws as scheduled.  Kristopher Poot, FNP  08/15/2021 9:13 AM   [facility] [address] [facility] [place] [address] [phone] (office) [phone] (fax)

## 2021-08-22 ENCOUNTER — Other Ambulatory Visit (HOSPITAL_COMMUNITY): Payer: Self-pay

## 2021-08-22 NOTE — Progress Notes (Signed)
Paramedicine Encounter ? ? ? Patient ID: Kristopher [name], male    DOB: 10-15-1950, 71 y.o.   MRN: [id number] ? ?Pt was re-referred to paramedicine and needing to confirm meds.  ?He was admitted in hosp 3X last year. Looks like twice for COPD and once for anemia.  ?He was seen in clinic recently and they wanted him to be seen for med compliance.  ?It looks like his jardiance was d/c when he was in hosp last November so I dont think hes been on since then. Clinie wants it restarted.  ?Living arrangements staying same.  ?Still using [place] but now on the one on w friendly.  ? ?Meds reviewed- ?He used to use pill box but states he has gotten use to taken them from bottle so he is fine to continue doing that.  ?He has not been taking atorvastatin. But will restart today.  ?He has not p/u jardiance yet.--$47 co-pay  ?Needs refills on lasix and entresto--$47 copay for entresto  ?Sent liz message about those. He used to get entresto from company so will see if he can do this again.  ?He stopped using CPAP -he is not interested in doing another sleep study. He said when he dies he wants to go happy, and he is not happy using his CPAP.  ?He has 02 to use in case he needs it, but he has not needed to use it for a while.  ?He reports feeling good, he denies increased sob, no dizziness, no c/p.  ?He is back on weighing daily.  ?Advised him to watch his weight and contact if he notices his weight go up.  ? ?He missed his appointments for pulm. He said his phone was acting up and he may have missed the reminder call.  ?He does have small amount of swelling to his legs. We reivewed his diet and he partakes in about 3 bags of movie theatre buttered popcorn. We talked about that-he doesn't use table salt.  ?We discussed his other foods he eats. He has a lot of work to do to cut back and to alter his food choices.  ?Will see if he is eligible for moms meals.  ?Sent jenna message for referral for that.  ? ? ?BP 140/78   Pulse 91    Resp 18   SpO2 97%  ?Weight Saturday-234 ?Last visit weight-238 ? ?Patient Care Team: ?Maurice Small, MD as PCP - General (Family Medicine) ?Sueanne Margarita, MD as PCP - Cardiology (Cardiology) ?Bensimhon, Shaune Pascal, MD as PCP - Advanced Heart Failure (Cardiology) ?Jorge Ny, LCSW as Education officer, museum (Licensed Holiday representative) ? ?Patient Active Problem List  ? Diagnosis Date Noted  ? COPD exacerbation ([place]) 05/03/2021  ? Hypoxemia 05/03/2021  ? Prolonged QT interval 05/03/2021  ? 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 AKI (acute kidney injury) ([place]) 02/27/2021  ? IDA (iron deficiency anemia) 02/27/2021  ? Acute on chronic respiratory failure with hypercapnia ([place]) 02/22/2021  ? Symptomatic anemia 11/06/2019  ? Thrombocytosis 11/06/2019  ? COPD (chronic obstructive pulmonary disease) ([place]) 11/06/2019  ? Dyslipidemia 11/06/2019  ? OSA (obstructive sleep apnea) 11/18/2018  ? Morbid obesity ([place]) 11/18/2018  ? PUD (peptic ulcer disease) 08/17/2016  ? Acute esophagitis 08/17/2016  ? Pulmonary HTN ([place]) 08/17/2016  ? Lymphadenopathy 08/17/2016  ? CAD (coronary artery disease) 08/16/2016  ? Anemia   ? DCM (dilated cardiomyopathy) ([place])   ? HTN (hypertension)   ? Chronic combined systolic and diastolic heart failure ([place])  08/11/2016  ? Acute blood loss anemia 08/11/2016  ? Acute upper GI bleed 08/10/2016  ? DOE (dyspnea on exertion) 08/10/2016  ? LBBB (left bundle branch block) 08/10/2016  ? ? ?Current Outpatient Medications:  ?  acetaminophen (TYLENOL) 325 MG tablet, Take 650 mg by mouth every 6 (six) hours as needed for mild pain. , Disp: , Rfl:  ?  albuterol (PROVENTIL) (2.5 MG/3ML) 0.083% nebulizer solution, Inhale 3 mLs into the lungs in the morning and at bedtime., Disp: , Rfl:  ?  aspirin 81 MG chewable tablet, Chew 1 tablet (81 mg total) by mouth daily., Disp: 30 tablet, Rfl: 6 ?  atorvastatin (LIPITOR) 40 MG tablet, Take 1 tablet (40 mg total) by mouth daily., Disp: 90 tablet, Rfl: 3 ?  empagliflozin (JARDIANCE)  10 MG TABS tablet, Take 1 tablet (10 mg total) by mouth daily before breakfast., Disp: 30 tablet, Rfl: 4 ?  ferrous sulfate 325 (65 FE) MG tablet, Take 1 tablet (325 mg total) by mouth daily with breakfast., Disp: 90 tablet, Rfl: 0 ?  furosemide (LASIX) 40 MG tablet, Take 2 tablets (80 mg total) by mouth 2 (two) times daily., Disp: 120 tablet, Rfl: 5 ?  magnesium hydroxide (MILK OF MAGNESIA) 400 MG/5ML suspension, Take 30 mLs by mouth See admin instructions. Tuesday and Thursday as needed, Disp: , Rfl:  ?  pantoprazole (PROTONIX) 40 MG tablet, Take 1 tablet (40 mg total) by mouth daily., Disp: 90 tablet, Rfl: 3 ?  sacubitril-valsartan (ENTRESTO) 49-51 MG, Take 1 tablet by mouth 2 (two) times daily., Disp: 60 tablet, Rfl: 11 ?  spironolactone (ALDACTONE) 25 MG tablet, Take 1 tablet (25 mg total) by mouth daily., Disp: 90 tablet, Rfl: 3 ?  SYMBICORT 160-4.5 MCG/ACT inhaler, Inhale 1 puff into the lungs in the morning and at bedtime., Disp: , Rfl:  ?Allergies  ?Allergen Reactions  ? Penicillins Other (See Comments)  ?  Pt tolerated ceftriaxone. ?Pt reports it makes him feel shaky Has patient had a PCN reaction causing immediate rash, facial/tongue/throat swelling, SOB or lightheadedness with hypotension: YES ?Has patient had a PCN reaction causing severe rash involving mucus membranes or skin necrosis: NO ?Has patient had a PCN reaction that required hospitalization NO ?Has patient had a PCN reaction occurring within the last 10 years: NO ?If all of the above answers are "NO", then may proceed with Cephalosporin use.  ? ? ? ? ?Social History  ? ?Socioeconomic History  ? Marital status: Single  ?  Spouse name: Not on file  ? Number of children: Not on file  ? Years of education: Not on file  ? Highest education level: Not on file  ?Occupational History  ? Occupation: retired  ?Tobacco Use  ? Smoking status: Former  ?  Packs/day: 0.50  ?  Years: 44.00  ?  Pack years: 22.00  ?  Types: Cigarettes  ?  Quit date: 06/11/1998  ?   Years since quitting: 23.2  ? Smokeless tobacco: Never  ?Vaping Use  ? Vaping Use: Never used  ?Substance and Sexual Activity  ? Alcohol use: Not Currently  ?  Comment: h/o heavy use  ? Drug use: Not Currently  ? Sexual activity: Not on file  ?Other Topics Concern  ? Not on file  ?Social History Narrative  ? Not on file  ? ?Social Determinants of Health  ? ?Financial Resource Strain: Not on file  ?Food Insecurity: No Food Insecurity  ? Worried About Charity fundraiser in the Last  Year: Never true  ? Ran Out of Food in the Last Year: Never true  ?Transportation Needs: No Transportation Needs  ? Lack of Transportation (Medical): No  ? Lack of Transportation (Non-Medical): No  ?Physical Activity: Not on file  ?Stress: Not on file  ?Social Connections: Not on file  ?Intimate Partner Violence: Not on file  ? ? ?Physical Exam ? ? ? ? ? ?Future Appointments  ?Date Time Provider [place]  ?09/18/2021 11:00 AM Bensimhon, Shaune Pascal, MD [facility] None  ? ? ? ? ? ?Marylouise Stacks, [place] ?[phone] ?Community Health Paramedic  ?08/22/21 ?

## 2021-08-24 ENCOUNTER — Telehealth (HOSPITAL_COMMUNITY): Payer: Self-pay | Admitting: Licensed Clinical Social Worker

## 2021-08-24 NOTE — Telephone Encounter (Signed)
Community paramedic inquired about referral to [employer] to help pt with healthy eating and fluid retention concerns.  CSW attempted to call pt to discuss- unable to reach- left VM requesting return call ? ?[name], LCSW ?Clinical Social Worker ?[facility] ?Desk#: [phone] ?Cell#: [phone] ? ?

## 2021-08-25 DIAGNOSIS — G4731 Primary central sleep apnea: Secondary | ICD-10-CM | POA: Diagnosis not present

## 2021-08-25 DIAGNOSIS — J45998 Other asthma: Secondary | ICD-10-CM | POA: Diagnosis not present

## 2021-08-28 ENCOUNTER — Telehealth (HOSPITAL_COMMUNITY): Payer: Self-pay | Admitting: Licensed Clinical Social Worker

## 2021-08-28 NOTE — Telephone Encounter (Signed)
Patient identified as a candidate to receive 7 heart healthy meals per week for 4 weeks through [employer].  Completed referral sent in for review.  Anticipate patient will receive first shipment of food in 1-3 business days.  Jazzlynn Rawe H. Cordae Mccarey, LCSW Clinical Social Worker [facility] Desk#: [phone] Cell#: [phone]  

## 2021-08-29 ENCOUNTER — Telehealth (HOSPITAL_COMMUNITY): Payer: Self-pay

## 2021-08-29 NOTE — Telephone Encounter (Signed)
Telephone visit- ?He reports feeling ok, he has forgotten to take his meds in the evening so his weight is bouncing between 233-235. He is trying to cut back on diet choices.  ?He had no issues getting his meds using the grant info.  ?He now states his inhaler is expensive, I worked with him before on getting assistance with that, will see him next week to call them to see about applying again.  ? ?He denies any issues or concerns at this time.  ? ?Kerry Hough, EMT-Paramedic  ?08/29/21 ? ? ? ?

## 2021-09-07 ENCOUNTER — Other Ambulatory Visit (HOSPITAL_COMMUNITY): Payer: Self-pay

## 2021-09-07 DIAGNOSIS — I5042 Chronic combined systolic (congestive) and diastolic (congestive) heart failure: Secondary | ICD-10-CM | POA: Diagnosis not present

## 2021-09-07 DIAGNOSIS — I10 Essential (primary) hypertension: Secondary | ICD-10-CM | POA: Diagnosis not present

## 2021-09-07 DIAGNOSIS — J441 Chronic obstructive pulmonary disease with (acute) exacerbation: Secondary | ICD-10-CM | POA: Diagnosis not present

## 2021-09-07 DIAGNOSIS — E1159 Type 2 diabetes mellitus with other circulatory complications: Secondary | ICD-10-CM | POA: Diagnosis not present

## 2021-09-07 NOTE — Progress Notes (Addendum)
Paramedicine Encounter ? ? ? Patient ID: Kristopher [name], male    DOB: [birth year], 71 y.o.   MRN: [id number] ? ? ?Pt reports he has been feeling good, he denies increased sob, no dizziness,  no c/p. He only reports feeling sleepy some during the day. I have advised him that would be likely due to the sleep apnea but he refused to use CPAP. He said he would deal with that part b/c he cannot use that sleep machine.  ?He forgot about the atorvastatin and wasn't taking it again, I again reminded him and placed it in bag with other meds.  ? ?He reports the symbicort inhaler is expensive, so I have looked up the az&me  and we are able to apply online -will work on that.  ? ?Will f/u next week.  ? ? ? ? ? ?BP (!) 144/70   Pulse (!) 102   Resp 18   Wt 233 lb (105.7 kg)   SpO2 97%   BMI 36.49 kg/m?  ?Weight yesterday-233 ?Last visit weight-234 ? ?Patient Care Team: ?Maurice Small, MD as PCP - General (Family Medicine) ?Sueanne Margarita, MD as PCP - Cardiology (Cardiology) ?Bensimhon, Shaune Pascal, MD as PCP - Advanced Heart Failure (Cardiology) ?Jorge Ny, LCSW as Education officer, museum (Licensed Holiday representative) ? ?Patient Active Problem List  ? Diagnosis Date Noted  ? COPD exacerbation ([place]) 05/03/2021  ? Hypoxemia 05/03/2021  ? Prolonged QT interval 05/03/2021  ? 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 AKI (acute kidney injury) ([place]) 02/27/2021  ? IDA (iron deficiency anemia) 02/27/2021  ? Acute on chronic respiratory failure with hypercapnia ([place]) 02/22/2021  ? Symptomatic anemia 11/06/2019  ? Thrombocytosis 11/06/2019  ? COPD (chronic obstructive pulmonary disease) ([place]) 11/06/2019  ? Dyslipidemia 11/06/2019  ? OSA (obstructive sleep apnea) 11/18/2018  ? Morbid obesity ([place]) 11/18/2018  ? PUD (peptic ulcer disease) 08/17/2016  ? Acute esophagitis 08/17/2016  ? Pulmonary HTN ([place]) 08/17/2016  ? Lymphadenopathy 08/17/2016  ? CAD (coronary artery disease) 08/16/2016  ? Anemia   ? DCM (dilated cardiomyopathy) ([place])   ? HTN  (hypertension)   ? Chronic combined systolic and diastolic heart failure ([place]) 08/11/2016  ? Acute blood loss anemia 08/11/2016  ? Acute upper GI bleed 08/10/2016  ? DOE (dyspnea on exertion) 08/10/2016  ? LBBB (left bundle branch block) 08/10/2016  ? ? ?Current Outpatient Medications:  ?  acetaminophen (TYLENOL) 325 MG tablet, Take 650 mg by mouth every 6 (six) hours as needed for mild pain. , Disp: , Rfl:  ?  albuterol (PROVENTIL) (2.5 MG/3ML) 0.083% nebulizer solution, Inhale 3 mLs into the lungs in the morning and at bedtime., Disp: , Rfl:  ?  aspirin 81 MG chewable tablet, Chew 1 tablet (81 mg total) by mouth daily., Disp: 30 tablet, Rfl: 6 ?  atorvastatin (LIPITOR) 40 MG tablet, Take 1 tablet (40 mg total) by mouth daily., Disp: 90 tablet, Rfl: 3 ?  empagliflozin (JARDIANCE) 10 MG TABS tablet, Take 1 tablet (10 mg total) by mouth daily before breakfast., Disp: 30 tablet, Rfl: 4 ?  ferrous sulfate 325 (65 FE) MG tablet, Take 1 tablet (325 mg total) by mouth daily with breakfast., Disp: 90 tablet, Rfl: 0 ?  furosemide (LASIX) 40 MG tablet, Take 2 tablets (80 mg total) by mouth 2 (two) times daily., Disp: 120 tablet, Rfl: 5 ?  magnesium hydroxide (MILK OF MAGNESIA) 400 MG/5ML suspension, Take 30 mLs by mouth See admin instructions. Tuesday and Thursday as needed, Disp: ,  Rfl:  ?  pantoprazole (PROTONIX) 40 MG tablet, Take 1 tablet (40 mg total) by mouth daily., Disp: 90 tablet, Rfl: 3 ?  sacubitril-valsartan (ENTRESTO) 49-51 MG, Take 1 tablet by mouth 2 (two) times daily., Disp: 60 tablet, Rfl: 11 ?  spironolactone (ALDACTONE) 25 MG tablet, Take 1 tablet (25 mg total) by mouth daily., Disp: 90 tablet, Rfl: 3 ?  SYMBICORT 160-4.5 MCG/ACT inhaler, Inhale 1 puff into the lungs in the morning and at bedtime., Disp: , Rfl:  ?Allergies  ?Allergen Reactions  ? Penicillins Other (See Comments)  ?  Pt tolerated ceftriaxone. ?Pt reports it makes him feel shaky Has patient had a PCN reaction causing immediate rash,  facial/tongue/throat swelling, SOB or lightheadedness with hypotension: YES ?Has patient had a PCN reaction causing severe rash involving mucus membranes or skin necrosis: NO ?Has patient had a PCN reaction that required hospitalization NO ?Has patient had a PCN reaction occurring within the last 10 years: NO ?If all of the above answers are "NO", then may proceed with Cephalosporin use.  ? ? ? ? ?Social History  ? ?Socioeconomic History  ? Marital status: Single  ?  Spouse name: Not on file  ? Number of children: Not on file  ? Years of education: Not on file  ? Highest education level: Not on file  ?Occupational History  ? Occupation: retired  ?Tobacco Use  ? Smoking status: Former  ?  Packs/day: 0.50  ?  Years: 44.00  ?  Pack years: 22.00  ?  Types: Cigarettes  ?  Quit date: 06/11/1998  ?  Years since quitting: 23.2  ? Smokeless tobacco: Never  ?Vaping Use  ? Vaping Use: Never used  ?Substance and Sexual Activity  ? Alcohol use: Not Currently  ?  Comment: h/o heavy use  ? Drug use: Not Currently  ? Sexual activity: Not on file  ?Other Topics Concern  ? Not on file  ?Social History Narrative  ? Not on file  ? ?Social Determinants of Health  ? ?Financial Resource Strain: Not on file  ?Food Insecurity: No Food Insecurity  ? Worried About Charity fundraiser in the Last Year: Never true  ? Ran Out of Food in the Last Year: Never true  ?Transportation Needs: No Transportation Needs  ? Lack of Transportation (Medical): No  ? Lack of Transportation (Non-Medical): No  ?Physical Activity: Not on file  ?Stress: Not on file  ?Social Connections: Not on file  ?Intimate Partner Violence: Not on file  ? ? ?Physical Exam ? ? ? ? ? ?Future Appointments  ?Date Time Provider [place]  ?09/18/2021 11:00 AM Bensimhon, Shaune Pascal, MD [facility] None  ? ? ? ? ? ?Marylouise Stacks, [place] ?[phone] ?Community Health Paramedic  ?09/07/21 ?

## 2021-09-09 DIAGNOSIS — J9622 Acute and chronic respiratory failure with hypercapnia: Secondary | ICD-10-CM | POA: Diagnosis not present

## 2021-09-09 DIAGNOSIS — J449 Chronic obstructive pulmonary disease, unspecified: Secondary | ICD-10-CM | POA: Diagnosis not present

## 2021-09-11 ENCOUNTER — Telehealth (HOSPITAL_COMMUNITY): Payer: Self-pay

## 2021-09-11 NOTE — Telephone Encounter (Signed)
Pt reported that his symbicort was very expensive. When he was last enrolled with paramedicine we got him approved for assistance with this at that time.  ?Completed online enrollment for his symbicort at AZ&ME.  ?Per the website it showed that they were waiting on rx from provider.  ?Will continue to f/u on this.  ? ?Marylouise Stacks, [place]  ?[phone] ?09/11/2021 ? ?

## 2021-09-18 ENCOUNTER — Other Ambulatory Visit (HOSPITAL_COMMUNITY): Payer: Self-pay

## 2021-09-18 ENCOUNTER — Ambulatory Visit (HOSPITAL_COMMUNITY)
Admission: RE | Admit: 2021-09-18 | Discharge: 2021-09-18 | Disposition: A | Discharge status: Home / Self Care | Payer: Medicare Other | Source: Ambulatory Visit | Attending: Internal Medicine | Admitting: Internal Medicine

## 2021-09-18 VITALS — BP 140/72 | HR 100 | Wt 235.1 lb

## 2021-09-18 DIAGNOSIS — I5022 Chronic systolic (congestive) heart failure: Secondary | ICD-10-CM

## 2021-09-18 DIAGNOSIS — G4733 Obstructive sleep apnea (adult) (pediatric): Secondary | ICD-10-CM | POA: Diagnosis not present

## 2021-09-18 DIAGNOSIS — Z79899 Other long term (current) drug therapy: Secondary | ICD-10-CM | POA: Insufficient documentation

## 2021-09-18 DIAGNOSIS — I2510 Atherosclerotic heart disease of native coronary artery without angina pectoris: Secondary | ICD-10-CM | POA: Diagnosis not present

## 2021-09-18 DIAGNOSIS — I447 Left bundle-branch block, unspecified: Secondary | ICD-10-CM | POA: Insufficient documentation

## 2021-09-18 DIAGNOSIS — Z7982 Long term (current) use of aspirin: Secondary | ICD-10-CM | POA: Diagnosis not present

## 2021-09-18 DIAGNOSIS — Z7984 Long term (current) use of oral hypoglycemic drugs: Secondary | ICD-10-CM | POA: Insufficient documentation

## 2021-09-18 DIAGNOSIS — J449 Chronic obstructive pulmonary disease, unspecified: Secondary | ICD-10-CM | POA: Diagnosis not present

## 2021-09-18 DIAGNOSIS — E669 Obesity, unspecified: Secondary | ICD-10-CM | POA: Insufficient documentation

## 2021-09-18 DIAGNOSIS — I5042 Chronic combined systolic (congestive) and diastolic (congestive) heart failure: Secondary | ICD-10-CM | POA: Insufficient documentation

## 2021-09-18 DIAGNOSIS — I110 Hypertensive heart disease with heart failure: Secondary | ICD-10-CM | POA: Diagnosis not present

## 2021-09-18 DIAGNOSIS — Z6836 Body mass index (BMI) 36.0-36.9, adult: Secondary | ICD-10-CM | POA: Insufficient documentation

## 2021-09-18 DIAGNOSIS — J441 Chronic obstructive pulmonary disease with (acute) exacerbation: Secondary | ICD-10-CM | POA: Insufficient documentation

## 2021-09-18 DIAGNOSIS — R0902 Hypoxemia: Secondary | ICD-10-CM | POA: Insufficient documentation

## 2021-09-18 DIAGNOSIS — E119 Type 2 diabetes mellitus without complications: Secondary | ICD-10-CM | POA: Insufficient documentation

## 2021-09-18 DIAGNOSIS — I272 Pulmonary hypertension, unspecified: Secondary | ICD-10-CM | POA: Insufficient documentation

## 2021-09-18 DIAGNOSIS — R0689 Other abnormalities of breathing: Secondary | ICD-10-CM | POA: Insufficient documentation

## 2021-09-18 LAB — BASIC METABOLIC PANEL
Anion gap: 7 (ref 5–15)
BUN: 13 mg/dL (ref 8–23)
CO2: 30 mmol/L (ref 22–32)
Calcium: 9.4 mg/dL (ref 8.9–10.3)
Chloride: 100 mmol/L (ref 98–111)
Creatinine, Ser: 1.25 mg/dL — ABNORMAL HIGH (ref 0.61–1.24)
GFR, Estimated: >60 mL/min (ref >60)
Glucose, Bld: 103 mg/dL — ABNORMAL HIGH (ref 70–99)
Potassium: 4.3 mmol/L (ref 3.5–5.1)
Sodium: 137 mmol/L (ref 135–145)

## 2021-09-18 LAB — BRAIN NATRIURETIC PEPTIDE: B Natriuretic Peptide: 44.1 pg/mL (ref 0.0–100.0)

## 2021-09-18 MED ORDER — ENTRESTO 97-103 MG PO TABS: 1.0000 | ORAL_TABLET | Freq: Two times a day (BID) | ORAL | 6 refills | Status: AC

## 2021-09-18 NOTE — Addendum Note (Signed)
Encounter addended by: Scarlette Calico, RN on: 09/18/2021 12:07 PM ? Actions taken: Pharmacy for encounter modified, Order list changed, Diagnosis association updated, Clinical Note Signed, Charge Capture section accepted

## 2021-09-18 NOTE — Progress Notes (Signed)
Paramedicine Encounter ? ? ?Patient ID: Kristopher [name] , male,   DOB: 1951/03/12,70 y.o.,  MRN: [id number] ? ? ?Met patient in clinic today with provider.  ? ?Weight @ clinic-235 ?B/p-140/72 ?P-100 ?[id number] ? ?Weight @ home-230 ? ?I am not 100% sure he is taking meds as directed.  ?He did take meds this morning except for his lasix.  ?He will take it when he gets home  ?Increase entresto to 97-103.  ? ?ECHO repeat soon.  ?Labs done today.  ? ? ?Marylouise Stacks, EMT-Paramedic ?[phone] ?09/18/2021 ? ? ? ? ? ?

## 2021-09-18 NOTE — Progress Notes (Signed)
?  ? ?                  Advanced HF Clinic Note ? ?ID:  Kristopher [name], DOB Apr 22, 1951, MRN [id number]  Location: Home  ?Provider location: [address] [place] ?Type of Visit: Established patient  ? ?PCP:  Shirlean Mylar, MD  ?Cardiologist:  Armanda Magic, MD ?HF: Dr Gala Romney ? ?Chief Complaint: F/U for Chronic Systolic Heart Failure  ?  ?History of Present Illness: ?Kristopher [name] is a 71 y.o. male with a history of obesity, HTN, DM2, Asthma, Chronic combined diastolic/systolic CHF, severe OSA (AHI 65) and NICM.  ?  ?Admitted 2/10 - 08/08/2018 with mixed AECOPD and A/C combined CHF. Initially required intubation in the setting of resp failure. Had [facility] as below in the setting of new drop in EF to 25-30%. Medications titrated as tolerated. He was discharged to SNF with CPAP (also new for him). D/c weight 235 pounds ?  ?Sleep study 10/31/18 with severe OSA (AHI 65). Now on CPAP w/ good compliance. Also w/ improved medication compliance w/ the help of paramedicine.  ? ?Echo 6/20 and showed persistently low EF, ~ 25-30%. He was referred to EP for ICD but declined. Does not want device.  ? ?Admitted 9/22 for acute hypercarbic respiratory failure (off CPAP x 2 months), AKI and possible sepsis. Echo 9/22 EF 70-75%, hyperdynamic LV function, biatrial enlargement, mild MR, mild AI, RV okay. Diuresed with IV Lasix, beta blocker held with wenckebach, and discharged on 2L home oxygen and trilogy ventilator. ? ?Admitted 11/22 for COPD exacerbation and CAP. Treated with steroids and abx. He received IV diuresis and Jardiance was stopped at discharge. ? ?Post hospital follow up 1/23 he had not been seen in clinic since 4/21. Stable NYHA II symptoms and Jardiance restarted. ? ?Today he returns for HF follow up. Here with paramedicine. Feels great. Doing weedeating and all activities without problem. Denies CP, SOB, orthopnea or PND. No edema. Refuses CPAP.  ? ? ?Cardiac Studies  ?- Echo 9/22: EF 70-75%, mild LVH, normal  RV ? ?- R/LHC 2/20  ?Prox LAD lesion is 30% stenosed. ?Prox RCA to Mid RCA lesion is 30% stenosed. ?Prox Cx lesion is 30% stenosed. ?  ?Findings: ?  ?Ao = 107/69 (87)  ?LV =  119/13 ?RA =  5 ?RV = 64/11 ?PA = 71/23 (39) ?PCW = 21 ?Fick cardiac output/index = 5.6/2.6 ?PVR = 3.1 WU ?Ao sat = 94% ?PA sat = 64%, 65% ?  ?Assessment: ?1. Minimal non-obstructive CAD ?2. NICM EF 30-35% ?3. Moderate pulmonary HTN due primarily to OSA/OHS ?   ?- Echo 6/20 EF 25-30%, moderate LVH, abnormal septal motion consistent w/ LBBB, trivial pericardial effusion.  ? ?Past Medical History:  ?Diagnosis Date  ? Acute combined systolic and diastolic heart failure (HCC) 08/11/2016  ? Acute esophagitis 08/17/2016  ? Acute upper GI bleed 08/10/2016  ? Asthma   ? CAD (coronary artery disease) 08/16/2016  ? CHF (congestive heart failure) (HCC)   ? COPD (chronic obstructive pulmonary disease) (HCC) 11/06/2019  ? DCM (dilated cardiomyopathy) (HCC)   ? Dyslipidemia 11/06/2019  ? Dyspnea   ? HTN (hypertension)   ? LBBB (left bundle branch block) 08/10/2016  ? Lymphadenopathy 08/17/2016  ? OSA (obstructive sleep apnea) 11/18/2018  ? [place] home sleep study revealed Severe Obstructive Sleep Apnea with AHI 65.8/hr. Central sleep apnea was noted with an pAHIc of 11.4/hr. % Cheyne Stokes respirations was 10.3%. Nocturnal Hypoxemia was noted  with O2 saturations as low as 72%. Time spent with Oxygen desaturations < 88% was 97.6 minutes.  ? PUD (peptic ulcer disease) 08/17/2016  ? Pulmonary HTN (HCC) 08/17/2016  ? ?Past Surgical History:  ?Procedure Laterality Date  ? BIOPSY  11/07/2019  ? Procedure: BIOPSY;  Surgeon: [name], MD;  Location: [facility] ENDOSCOPY;  Service: Gastroenterology;;  ? BIOPSY  11/08/2019  ? Procedure: BIOPSY;  Surgeon: [name], MD;  Location: [facility] ENDOSCOPY;  Service: Gastroenterology;;  ? BIOPSY  02/25/2021  ? Procedure: BIOPSY;  Surgeon: [name], MD;  [facility] ENDOSCOPY;  Service: Gastroenterology;;  ? COLONOSCOPY WITH PROPOFOL  N/A 11/08/2019  ? Procedure: COLONOSCOPY WITH PROPOFOL;  Surgeon: [name], MD;  Location: [facility] ENDOSCOPY;  Service: Gastroenterology;  Laterality: N/A;  ? ESOPHAGOGASTRODUODENOSCOPY (EGD) WITH PROPOFOL Left 08/11/2016  ? Procedure: ESOPHAGOGASTRODUODENOSCOPY (EGD) WITH PROPOFOL;  Surgeon: Willis Modena, MD;  Location: [facility] ENDOSCOPY;  Service: Endoscopy;  Laterality: Left;  ? ESOPHAGOGASTRODUODENOSCOPY (EGD) WITH PROPOFOL Left 11/07/2019  ? Procedure: ESOPHAGOGASTRODUODENOSCOPY (EGD) WITH PROPOFOL;  Surgeon: [name], MD;  Location: [facility] ENDOSCOPY;  Service: Gastroenterology;  Laterality: Left;  ? ESOPHAGOGASTRODUODENOSCOPY (EGD) WITH PROPOFOL N/A 02/25/2021  ? Procedure: ESOPHAGOGASTRODUODENOSCOPY (EGD) WITH PROPOFOL;  Surgeon: [name], MD;  Location: [facility] ENDOSCOPY;  Service: Gastroenterology;  Laterality: N/A;  ? POLYPECTOMY  11/08/2019  ? Procedure: POLYPECTOMY;  Surgeon: [name], MD;  Location: [facility] ENDOSCOPY;  Service: Gastroenterology;;  ? RIGHT/LEFT HEART CATH AND CORONARY ANGIOGRAPHY N/A 08/15/2016  ? Procedure: Right/Left Heart Cath and Coronary Angiography;  Surgeon: Corky Crafts, MD;  Location: [facility] INVASIVE CV LAB;  Service: Cardiovascular;  Laterality: N/A;  ? RIGHT/LEFT HEART CATH AND CORONARY ANGIOGRAPHY N/A 08/04/2018  ? Procedure: RIGHT/LEFT HEART CATH AND CORONARY ANGIOGRAPHY;  Surgeon: Dolores Patty, MD;  Location: [facility] INVASIVE CV LAB;  Service: Cardiovascular;  Laterality: N/A;  ? ?Current Outpatient Medications  ?Medication Sig Dispense Refill  ? acetaminophen (TYLENOL) 325 MG tablet Take 650 mg by mouth every 6 (six) hours as needed for mild pain.     ? albuterol (PROVENTIL) (2.5 MG/3ML) 0.083% nebulizer solution Inhale 3 mLs into the lungs in the morning and at bedtime.    ? aspirin 81 MG chewable tablet Chew 1 tablet (81 mg total) by mouth daily. 30 tablet 6  ? atorvastatin (LIPITOR) 40 MG tablet Take 1 tablet (40 mg total) by mouth daily. 90 tablet 3  ?  budesonide-formoterol (SYMBICORT) 160-4.5 MCG/ACT inhaler Inhale 2 puffs into the lungs 2 (two) times daily.    ? empagliflozin (JARDIANCE) 10 MG TABS tablet Take 1 tablet (10 mg total) by mouth daily before breakfast. 30 tablet 4  ? ferrous sulfate 325 (65 FE) MG tablet Take 1 tablet (325 mg total) by mouth daily with breakfast. 90 tablet 0  ? furosemide (LASIX) 40 MG tablet Take 2 tablets (80 mg total) by mouth 2 (two) times daily. 120 tablet 5  ? magnesium hydroxide (MILK OF MAGNESIA) 400 MG/5ML suspension Take 30 mLs by mouth See admin instructions. Tuesday and Thursday as needed    ? pantoprazole (PROTONIX) 40 MG tablet Take 1 tablet (40 mg total) by mouth daily. 90 tablet 3  ? sacubitril-valsartan (ENTRESTO) 49-51 MG Take 1 tablet by mouth 2 (two) times daily. 60 tablet 11  ? spironolactone (ALDACTONE) 25 MG tablet Take 1 tablet (25 mg total) by mouth daily. 90 tablet 3  ? ?No current facility-administered medications for this encounter.  ? ?Allergies:   Penicillins  ? ?Social History:  The  patient  reports that he quit smoking about 23 years ago. His smoking use included cigarettes. He has a 22.00 pack-year smoking history. He has never used smokeless tobacco. He reports that he does not currently use alcohol. He reports that he does not currently use drugs.  ? ?Family History:  The patient's family history includes Hypertension in his mother and sister.  ? ?ROS:  Please see the history of present illness.   All other systems are personally reviewed and negative.  ? ?BP 140/72 (BP Location: Left Arm, Patient Position: Sitting)   Pulse 100   Wt 106.7 kg (235 lb 2 oz)   SpO2 96%   BMI 36.83 kg/m?  ? ?PHYSICAL EXAM: ?General:  Well appearing. No resp difficulty ?HEENT: normal ?Neck: supple. no JVD. Carotids 2+ bilat; no bruits. No lymphadenopathy or thryomegaly appreciated. ?Cor: PMI nondisplaced. Regular rate & rhythm. No rubs, gallops or murmurs. ?Lungs: clear ?Abdomen: obese soft, nontender, nondistended.  No hepatosplenomegaly. No bruits or masses. Good bowel sounds. ?Extremities: no cyanosis, clubbing, rash, edema ?Neuro: alert & orientedx3, cranial nerves grossly intact. moves all 4 extremities w/o difficulty.

## 2021-09-18 NOTE — Patient Instructions (Signed)
Medication Changes: ? ?Increase Entresto to 97/103 mg Twice daily  ? ?Lab Work: ? ?Done today, we will call you for abnormal results ? ?Testing/Procedures: ? ?Your physician has requested that you have an echocardiogram. Echocardiography is a painless test that uses sound waves to create images of your heart. It provides your doctor with information about the size and shape of your heart and how well your heart?s chambers and valves are working. This procedure takes approximately one hour. There are no restrictions for this procedure. ? ?Referrals: ? ?None ? ?Special Instructions // Education: ? ?Do the following things EVERYDAY: ?Weigh yourself in the morning before breakfast. Write it down and keep it in a log. ?Take your medicines as prescribed ?Eat low salt foods--Limit salt (sodium) to 2000 mg per day.  ?Stay as active as you can everyday ?Limit all fluids for the day to less than 2 liters ? ?Follow-Up in: 6 months, **PLEASE CALL OUR OFFICE IN AUGUST TO SCHEDULE THIS APPOINTMENT ? ?At the [facility], you and your health needs are our priority. We have a designated team specialized in the treatment of Heart Failure. This Care Team includes your primary Heart Failure Specialized Cardiologist (physician), Advanced Practice Providers (APPs- Physician Assistants and Nurse Practitioners), and Pharmacist who all work together to provide you with the care you need, when you need it.  ? ?You may see any of the following providers on your designated Care Team at your next follow up: ? ?Dr Glori Bickers ?Dr Loralie Champagne ?Darrick Grinder, NP ?Lyda Jester, PA ?Jessica Milford,NP ?Marlyce Huge, PA ?Audry Riles, PharmD ? ? ?Please be sure to bring in all your medications bottles to every appointment.  ? ?Need to Contact us: ? ?If you have any questions or concerns before your next appointment please send [place] a message through [place] or call our office at [phone].   ? ?TO LEAVE A MESSAGE FOR THE  NURSE SELECT OPTION 2, PLEASE LEAVE A MESSAGE INCLUDING: ?YOUR NAME ?DATE OF BIRTH ?CALL BACK NUMBER ?REASON FOR CALL**this is important as we prioritize the call backs ? ?YOU WILL RECEIVE A CALL BACK THE SAME DAY AS LONG AS YOU CALL BEFORE 4:00 PM ? ? ?

## 2021-09-25 DIAGNOSIS — G4731 Primary central sleep apnea: Secondary | ICD-10-CM | POA: Diagnosis not present

## 2021-09-25 DIAGNOSIS — J45998 Other asthma: Secondary | ICD-10-CM | POA: Diagnosis not present

## 2021-09-27 DIAGNOSIS — E785 Hyperlipidemia, unspecified: Secondary | ICD-10-CM | POA: Diagnosis not present

## 2021-09-27 DIAGNOSIS — I5042 Chronic combined systolic (congestive) and diastolic (congestive) heart failure: Secondary | ICD-10-CM | POA: Diagnosis not present

## 2021-09-27 DIAGNOSIS — I10 Essential (primary) hypertension: Secondary | ICD-10-CM | POA: Diagnosis not present

## 2021-09-27 DIAGNOSIS — J449 Chronic obstructive pulmonary disease, unspecified: Secondary | ICD-10-CM | POA: Diagnosis not present

## 2021-09-27 DIAGNOSIS — J45998 Other asthma: Secondary | ICD-10-CM | POA: Diagnosis not present

## 2021-09-27 DIAGNOSIS — E1159 Type 2 diabetes mellitus with other circulatory complications: Secondary | ICD-10-CM | POA: Diagnosis not present

## 2021-09-29 ENCOUNTER — Other Ambulatory Visit (HOSPITAL_COMMUNITY): Payer: Self-pay | Admitting: Emergency Medicine

## 2021-09-29 NOTE — Progress Notes (Signed)
? ?Paramedicine Encounter ? ? ? Patient ID: Kristopher [name], male    DOB: January 07, 1951, 71 y.o.   MRN: [id number] ? ?ATF Mr. Rumpel A&O x 4, skin warm and dry with good color.  Pt very pleasant and upbeat has has no complaints to day.  He denies C/P or SOB.  Lung sounds clear and equal bilat.  No edema noted to his lower extremities.  Pt is compliant with his medications and is very familiar with what he takes and what each medication is for. Pt did not weigh yesterday but states his weight on Sunday was 233.1 lbs.  ? ?BP 128/60   Pulse 87   Resp 16   Wt 232 lb 12.8 oz (105.6 kg)   SpO2 96%   BMI 36.46 kg/m?  ?Weight yesterday-did not weigh  ?Last visit weight-232 lbs. ? ?Patient Care Team: ?Maurice Small, MD as PCP - General (Family Medicine) ?Sueanne Margarita, MD as PCP - Cardiology (Cardiology) ?Bensimhon, Shaune Pascal, MD as PCP - Advanced Heart Failure (Cardiology) ?Jorge Ny, LCSW as Education officer, museum (Licensed Holiday representative) ? ?Patient Active Problem List  ? Diagnosis Date Noted  ? COPD exacerbation ([place]) 05/03/2021  ? Hypoxemia 05/03/2021  ? Prolonged QT interval 05/03/2021  ? 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 AKI (acute kidney injury) ([place]) 02/27/2021  ? IDA (iron deficiency anemia) 02/27/2021  ? Acute on chronic respiratory failure with hypercapnia ([place]) 02/22/2021  ? Symptomatic anemia 11/06/2019  ? Thrombocytosis 11/06/2019  ? COPD (chronic obstructive pulmonary disease) ([place]) 11/06/2019  ? Dyslipidemia 11/06/2019  ? OSA (obstructive sleep apnea) 11/18/2018  ? Morbid obesity ([place]) 11/18/2018  ? PUD (peptic ulcer disease) 08/17/2016  ? Acute esophagitis 08/17/2016  ? Pulmonary HTN ([place]) 08/17/2016  ? Lymphadenopathy 08/17/2016  ? CAD (coronary artery disease) 08/16/2016  ? Anemia   ? DCM (dilated cardiomyopathy) ([place])   ? HTN (hypertension)   ? Chronic combined systolic and diastolic heart failure ([place]) 08/11/2016  ? Acute blood loss anemia 08/11/2016  ? Acute upper GI bleed 08/10/2016  ? DOE (dyspnea on  exertion) 08/10/2016  ? LBBB (left bundle branch block) 08/10/2016  ? ? ?Current Outpatient Medications:  ?  acetaminophen (TYLENOL) 325 MG tablet, Take 650 mg by mouth every 6 (six) hours as needed for mild pain. , Disp: , Rfl:  ?  albuterol (PROVENTIL) (2.5 MG/3ML) 0.083% nebulizer solution, Inhale 3 mLs into the lungs in the morning and at bedtime., Disp: , Rfl:  ?  aspirin 81 MG chewable tablet, Chew 1 tablet (81 mg total) by mouth daily., Disp: 30 tablet, Rfl: 6 ?  atorvastatin (LIPITOR) 40 MG tablet, Take 1 tablet (40 mg total) by mouth daily., Disp: 90 tablet, Rfl: 3 ?  budesonide-formoterol (SYMBICORT) 160-4.5 MCG/ACT inhaler, Inhale 2 puffs into the lungs 2 (two) times daily., Disp: , Rfl:  ?  empagliflozin (JARDIANCE) 10 MG TABS tablet, Take 1 tablet (10 mg total) by mouth daily before breakfast., Disp: 30 tablet, Rfl: 4 ?  ferrous sulfate 325 (65 FE) MG tablet, Take 1 tablet (325 mg total) by mouth daily with breakfast., Disp: 90 tablet, Rfl: 0 ?  furosemide (LASIX) 40 MG tablet, Take 2 tablets (80 mg total) by mouth 2 (two) times daily., Disp: 120 tablet, Rfl: 5 ?  magnesium hydroxide (MILK OF MAGNESIA) 400 MG/5ML suspension, Take 30 mLs by mouth See admin instructions. Tuesday and Thursday as needed, Disp: , Rfl:  ?  pantoprazole (PROTONIX) 40 MG tablet, Take 1 tablet (40 mg  total) by mouth daily., Disp: 90 tablet, Rfl: 3 ?  sacubitril-valsartan (ENTRESTO) 97-103 MG, Take 1 tablet by mouth 2 (two) times daily., Disp: 60 tablet, Rfl: 6 ?  spironolactone (ALDACTONE) 25 MG tablet, Take 1 tablet (25 mg total) by mouth daily., Disp: 90 tablet, Rfl: 3 ?Allergies  ?Allergen Reactions  ? Penicillins Other (See Comments)  ?  Pt tolerated ceftriaxone. ?Pt reports it makes him feel shaky Has patient had a PCN reaction causing immediate rash, facial/tongue/throat swelling, SOB or lightheadedness with hypotension: YES ?Has patient had a PCN reaction causing severe rash involving mucus membranes or skin necrosis:  NO ?Has patient had a PCN reaction that required hospitalization NO ?Has patient had a PCN reaction occurring within the last 10 years: NO ?If all of the above answers are "NO", then may proceed with Cephalosporin use.  ? ? ? ? ?Social History  ? ?Socioeconomic History  ? Marital status: Single  ?  Spouse name: Not on file  ? Number of children: Not on file  ? Years of education: Not on file  ? Highest education level: Not on file  ?Occupational History  ? Occupation: retired  ?Tobacco Use  ? Smoking status: Former  ?  Packs/day: 0.50  ?  Years: 44.00  ?  Pack years: 22.00  ?  Types: Cigarettes  ?  Quit date: 06/11/1998  ?  Years since quitting: 23.3  ? Smokeless tobacco: Never  ?Vaping Use  ? Vaping Use: Never used  ?Substance and Sexual Activity  ? Alcohol use: Not Currently  ?  Comment: h/o heavy use  ? Drug use: Not Currently  ? Sexual activity: Not on file  ?Other Topics Concern  ? Not on file  ?Social History Narrative  ? Not on file  ? ?Social Determinants of Health  ? ?Financial Resource Strain: Not on file  ?Food Insecurity: No Food Insecurity  ? Worried About Charity fundraiser in the Last Year: Never true  ? Ran Out of Food in the Last Year: Never true  ?Transportation Needs: No Transportation Needs  ? Lack of Transportation (Medical): No  ? Lack of Transportation (Non-Medical): No  ?Physical Activity: Not on file  ?Stress: Not on file  ?Social Connections: Not on file  ?Intimate Partner Violence: Not on file  ? ? ?Physical Exam ? ? ? ? ? ?Future Appointments  ?Date Time Provider [place]  ?10/02/2021  9:00 AM [facility] E[facility] OP 1 [facility]  ? ? ? ? ? ?Renee Ramus, EMT-Paramedic ?[phone] ?Community Health Paramedic  ?09/26/2021 ?

## 2021-10-02 ENCOUNTER — Ambulatory Visit (HOSPITAL_COMMUNITY)
Admission: RE | Admit: 2021-10-02 | Discharge: 2021-10-02 | Disposition: A | Discharge status: Home / Self Care | Payer: Medicare Other | Source: Ambulatory Visit | Attending: Family Medicine | Admitting: Family Medicine

## 2021-10-02 DIAGNOSIS — Z79899 Other long term (current) drug therapy: Secondary | ICD-10-CM | POA: Diagnosis not present

## 2021-10-02 DIAGNOSIS — I447 Left bundle-branch block, unspecified: Secondary | ICD-10-CM | POA: Diagnosis not present

## 2021-10-02 DIAGNOSIS — I340 Nonrheumatic mitral (valve) insufficiency: Secondary | ICD-10-CM | POA: Insufficient documentation

## 2021-10-02 DIAGNOSIS — G4730 Sleep apnea, unspecified: Secondary | ICD-10-CM | POA: Insufficient documentation

## 2021-10-02 DIAGNOSIS — I517 Cardiomegaly: Secondary | ICD-10-CM | POA: Insufficient documentation

## 2021-10-02 DIAGNOSIS — I5022 Chronic systolic (congestive) heart failure: Secondary | ICD-10-CM | POA: Diagnosis not present

## 2021-10-02 DIAGNOSIS — I110 Hypertensive heart disease with heart failure: Secondary | ICD-10-CM | POA: Diagnosis not present

## 2021-10-02 LAB — ECHOCARDIOGRAM COMPLETE
AR max vel: 3.05 cm2
AV Peak grad: 9.8 mmHg
Ao pk vel: 1.57 m/s
S' Lateral: 4.2 cm

## 2021-10-02 MED ORDER — PERFLUTREN LIPID MICROSPHERE
1.0000 mL | INTRAVENOUS | Status: AC | PRN
  Administered 2021-10-02: 4 mL via INTRAVENOUS
  Filled 2021-10-02: qty 10

## 2021-10-09 DIAGNOSIS — J9622 Acute and chronic respiratory failure with hypercapnia: Secondary | ICD-10-CM | POA: Diagnosis not present

## 2021-10-09 DIAGNOSIS — J449 Chronic obstructive pulmonary disease, unspecified: Secondary | ICD-10-CM | POA: Diagnosis not present

## 2021-10-12 ENCOUNTER — Other Ambulatory Visit (HOSPITAL_COMMUNITY): Payer: Self-pay | Admitting: Emergency Medicine

## 2021-10-12 NOTE — Progress Notes (Signed)
Paramedicine Encounter ? ? ? Patient ID: Kristopher [name], male    DOB: 06-13-1950, 71 y.o.   MRN: [id number] ? ?ATF 71 y.o. male A&O x 4.  Pt was very chipper and just getting his day started with a cup of coffee.  Pt has no complaints of chest pain or SOB.  Lung sounds clear and equal bilat.  No edema to his lower extremities.  Pt is well versed in his medications and stays current taking all his medications.  Medications reviewed and no refills needed at this time.  Pt maintains good nutritional choices avoiding salt and fried foods.   ?To schedule home visit the week of the 10/23/21. ? ?There were no vitals taken for this visit. ?Weight yesterday-230.8 lbs  ?Last visit weight-232 lbs ? ?Patient Care Team: ?Maurice Small, MD as PCP - General (Family Medicine) ?Sueanne Margarita, MD as PCP - Cardiology (Cardiology) ?Bensimhon, Shaune Pascal, MD as PCP - Advanced Heart Failure (Cardiology) ?Jorge Ny, LCSW as Education officer, museum (Licensed Holiday representative) ? ?Patient Active Problem List  ? Diagnosis Date Noted  ? COPD exacerbation ([place]) 05/03/2021  ? Hypoxemia 05/03/2021  ? Prolonged QT interval 05/03/2021  ? 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 AKI (acute kidney injury) ([place]) 02/27/2021  ? IDA (iron deficiency anemia) 02/27/2021  ? Acute on chronic respiratory failure with hypercapnia ([place]) 02/22/2021  ? Symptomatic anemia 11/06/2019  ? Thrombocytosis 11/06/2019  ? COPD (chronic obstructive pulmonary disease) ([place]) 11/06/2019  ? Dyslipidemia 11/06/2019  ? OSA (obstructive sleep apnea) 11/18/2018  ? Morbid obesity ([place]) 11/18/2018  ? PUD (peptic ulcer disease) 08/17/2016  ? Acute esophagitis 08/17/2016  ? Pulmonary HTN ([place]) 08/17/2016  ? Lymphadenopathy 08/17/2016  ? CAD (coronary artery disease) 08/16/2016  ? Anemia   ? DCM (dilated cardiomyopathy) ([place])   ? HTN (hypertension)   ? Chronic combined systolic and diastolic heart failure ([place]) 08/11/2016  ? Acute blood loss anemia 08/11/2016  ? Acute upper GI bleed 08/10/2016  ? DOE  (dyspnea on exertion) 08/10/2016  ? LBBB (left bundle branch block) 08/10/2016  ? ? ?Current Outpatient Medications:  ?  acetaminophen (TYLENOL) 325 MG tablet, Take 650 mg by mouth every 6 (six) hours as needed for mild pain. , Disp: , Rfl:  ?  albuterol (PROVENTIL) (2.5 MG/3ML) 0.083% nebulizer solution, Inhale 3 mLs into the lungs in the morning and at bedtime., Disp: , Rfl:  ?  aspirin 81 MG chewable tablet, Chew 1 tablet (81 mg total) by mouth daily., Disp: 30 tablet, Rfl: 6 ?  atorvastatin (LIPITOR) 40 MG tablet, Take 1 tablet (40 mg total) by mouth daily., Disp: 90 tablet, Rfl: 3 ?  budesonide-formoterol (SYMBICORT) 160-4.5 MCG/ACT inhaler, Inhale 2 puffs into the lungs 2 (two) times daily., Disp: , Rfl:  ?  empagliflozin (JARDIANCE) 10 MG TABS tablet, Take 1 tablet (10 mg total) by mouth daily before breakfast., Disp: 30 tablet, Rfl: 4 ?  ferrous sulfate 325 (65 FE) MG tablet, Take 1 tablet (325 mg total) by mouth daily with breakfast., Disp: 90 tablet, Rfl: 0 ?  furosemide (LASIX) 40 MG tablet, Take 2 tablets (80 mg total) by mouth 2 (two) times daily., Disp: 120 tablet, Rfl: 5 ?  magnesium hydroxide (MILK OF MAGNESIA) 400 MG/5ML suspension, Take 30 mLs by mouth See admin instructions. Tuesday and Thursday as needed, Disp: , Rfl:  ?  pantoprazole (PROTONIX) 40 MG tablet, Take 1 tablet (40 mg total) by mouth daily., Disp: 90 tablet, Rfl: 3 ?  sacubitril-valsartan (ENTRESTO) 97-103 MG, Take 1 tablet by mouth 2 (two) times daily., Disp: 60 tablet, Rfl: 6 ?  spironolactone (ALDACTONE) 25 MG tablet, Take 1 tablet (25 mg total) by mouth daily., Disp: 90 tablet, Rfl: 3 ?Allergies  ?Allergen Reactions  ? Penicillins Other (See Comments)  ?  Pt tolerated ceftriaxone. ?Pt reports it makes him feel shaky Has patient had a PCN reaction causing immediate rash, facial/tongue/throat swelling, SOB or lightheadedness with hypotension: YES ?Has patient had a PCN reaction causing severe rash involving mucus membranes or skin  necrosis: NO ?Has patient had a PCN reaction that required hospitalization NO ?Has patient had a PCN reaction occurring within the last 10 years: NO ?If all of the above answers are "NO", then may proceed with Cephalosporin use.  ? ? ? ? ?Social History  ? ?Socioeconomic History  ? Marital status: Single  ?  Spouse name: Not on file  ? Number of children: Not on file  ? Years of education: Not on file  ? Highest education level: Not on file  ?Occupational History  ? Occupation: retired  ?Tobacco Use  ? Smoking status: Former  ?  Packs/day: 0.50  ?  Years: 44.00  ?  Pack years: 22.00  ?  Types: Cigarettes  ?  Quit date: 06/11/1998  ?  Years since quitting: 23.3  ? Smokeless tobacco: Never  ?Vaping Use  ? Vaping Use: Never used  ?Substance and Sexual Activity  ? Alcohol use: Not Currently  ?  Comment: h/o heavy use  ? Drug use: Not Currently  ? Sexual activity: Not on file  ?Other Topics Concern  ? Not on file  ?Social History Narrative  ? Not on file  ? ?Social Determinants of Health  ? ?Financial Resource Strain: Not on file  ?Food Insecurity: No Food Insecurity  ? Worried About Charity fundraiser in the Last Year: Never true  ? Ran Out of Food in the Last Year: Never true  ?Transportation Needs: No Transportation Needs  ? Lack of Transportation (Medical): No  ? Lack of Transportation (Non-Medical): No  ?Physical Activity: Not on file  ?Stress: Not on file  ?Social Connections: Not on file  ?Intimate Partner Violence: Not on file  ? ? ?Physical Exam ? ? ? ? ? ?No future appointments. ? ? ? ? ?Renee Ramus, EMT-Paramedic ?[phone] ?Community Health Paramedic  ?10/12/21  ?

## 2021-10-19 ENCOUNTER — Other Ambulatory Visit (HOSPITAL_COMMUNITY): Payer: Self-pay | Admitting: Emergency Medicine

## 2021-10-19 NOTE — Progress Notes (Signed)
Paramedicine Encounter ? ? ? Patient ID: Kristopher [name], male    DOB: [birth year], 71 y.o.   MRN: [id number] ? ?ATF Kristopher [name] up and about making his morning coffee.  He reports to be feeling well, "just a little sore from working in the yard yesterday."  He denies any chest pain or SOB.  He is on track with his medications.  He has no peripheral edema.  Prior to my arrival this morning he stated he did daily breathing treatment.  Lung sounds clear upper and lower on the left side.  Clear lung sounds to the rt lower lobe and faint wheeze noted at the very end of expiration in the rt. Upper lobe.  Kristopher [name] says he feels fine and that he thinks its from all the work he did outside yesterday cutting grass and doing his yardwork.  Meds reviewed and visit complete. ? ?BP (P) 138/72 (BP Location: Left Arm, Patient Position: Sitting, Cuff Size: Normal)   Pulse (P) 98   Resp (P) 16   Wt (P) 229 lb 3.2 oz (104 kg)   SpO2 (P) 95%   BMI (P) 35.90 kg/m?  ?Weight yesterday-229.8 lb ?Last visit weight-229 lb ? ?Patient Care Team: ?Maurice Small, MD as PCP - General (Family Medicine) ?Sueanne Margarita, MD as PCP - Cardiology (Cardiology) ?Bensimhon, Shaune Pascal, MD as PCP - Advanced Heart Failure (Cardiology) ?Jorge Ny, LCSW as Education officer, museum (Licensed Holiday representative) ? ?Patient Active Problem List  ? Diagnosis Date Noted  ? COPD exacerbation ([place]) 05/03/2021  ? Hypoxemia 05/03/2021  ? Prolonged QT interval 05/03/2021  ? 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 AKI (acute kidney injury) ([place]) 02/27/2021  ? IDA (iron deficiency anemia) 02/27/2021  ? Acute on chronic respiratory failure with hypercapnia ([place]) 02/22/2021  ? Symptomatic anemia 11/06/2019  ? Thrombocytosis 11/06/2019  ? COPD (chronic obstructive pulmonary disease) ([place]) 11/06/2019  ? Dyslipidemia 11/06/2019  ? OSA (obstructive sleep apnea) 11/18/2018  ? Morbid obesity ([place]) 11/18/2018  ? PUD (peptic ulcer disease) 08/17/2016  ? Acute esophagitis 08/17/2016  ? Pulmonary  HTN ([place]) 08/17/2016  ? Lymphadenopathy 08/17/2016  ? CAD (coronary artery disease) 08/16/2016  ? Anemia   ? DCM (dilated cardiomyopathy) ([place])   ? HTN (hypertension)   ? Chronic combined systolic and diastolic heart failure ([place]) 08/11/2016  ? Acute blood loss anemia 08/11/2016  ? Acute upper GI bleed 08/10/2016  ? DOE (dyspnea on exertion) 08/10/2016  ? LBBB (left bundle branch block) 08/10/2016  ? ? ?Current Outpatient Medications:  ?  acetaminophen (TYLENOL) 325 MG tablet, Take 650 mg by mouth every 6 (six) hours as needed for mild pain. , Disp: , Rfl:  ?  albuterol (PROVENTIL) (2.5 MG/3ML) 0.083% nebulizer solution, Inhale 3 mLs into the lungs in the morning and at bedtime., Disp: , Rfl:  ?  aspirin 81 MG chewable tablet, Chew 1 tablet (81 mg total) by mouth daily., Disp: 30 tablet, Rfl: 6 ?  atorvastatin (LIPITOR) 40 MG tablet, Take 1 tablet (40 mg total) by mouth daily., Disp: 90 tablet, Rfl: 3 ?  budesonide-formoterol (SYMBICORT) 160-4.5 MCG/ACT inhaler, Inhale 2 puffs into the lungs 2 (two) times daily., Disp: , Rfl:  ?  empagliflozin (JARDIANCE) 10 MG TABS tablet, Take 1 tablet (10 mg total) by mouth daily before breakfast., Disp: 30 tablet, Rfl: 4 ?  ferrous sulfate 325 (65 FE) MG tablet, Take 1 tablet (325 mg total) by mouth daily with breakfast., Disp: 90 tablet, Rfl: 0 ?  furosemide (LASIX) 40 MG tablet, Take 2 tablets (80 mg total) by mouth 2 (two) times daily., Disp: 120 tablet, Rfl: 5 ?  magnesium hydroxide (MILK OF MAGNESIA) 400 MG/5ML suspension, Take 30 mLs by mouth See admin instructions. Tuesday and Thursday as needed, Disp: , Rfl:  ?  pantoprazole (PROTONIX) 40 MG tablet, Take 1 tablet (40 mg total) by mouth daily., Disp: 90 tablet, Rfl: 3 ?  sacubitril-valsartan (ENTRESTO) 97-103 MG, Take 1 tablet by mouth 2 (two) times daily., Disp: 60 tablet, Rfl: 6 ?  spironolactone (ALDACTONE) 25 MG tablet, Take 1 tablet (25 mg total) by mouth daily., Disp: 90 tablet, Rfl: 3 ?Allergies  ?Allergen  Reactions  ? Penicillins Other (See Comments)  ?  Pt tolerated ceftriaxone. ?Pt reports it makes him feel shaky Has patient had a PCN reaction causing immediate rash, facial/tongue/throat swelling, SOB or lightheadedness with hypotension: YES ?Has patient had a PCN reaction causing severe rash involving mucus membranes or skin necrosis: NO ?Has patient had a PCN reaction that required hospitalization NO ?Has patient had a PCN reaction occurring within the last 10 years: NO ?If all of the above answers are "NO", then may proceed with Cephalosporin use.  ? ? ? ? ?Social History  ? ?Socioeconomic History  ? Marital status: Single  ?  Spouse name: Not on file  ? Number of children: Not on file  ? Years of education: Not on file  ? Highest education level: Not on file  ?Occupational History  ? Occupation: retired  ?Tobacco Use  ? Smoking status: Former  ?  Packs/day: 0.50  ?  Years: 44.00  ?  Pack years: 22.00  ?  Types: Cigarettes  ?  Quit date: 06/11/1998  ?  Years since quitting: 23.3  ? Smokeless tobacco: Never  ?Vaping Use  ? Vaping Use: Never used  ?Substance and Sexual Activity  ? Alcohol use: Not Currently  ?  Comment: h/o heavy use  ? Drug use: Not Currently  ? Sexual activity: Not on file  ?Other Topics Concern  ? Not on file  ?Social History Narrative  ? Not on file  ? ?Social Determinants of Health  ? ?Financial Resource Strain: Not on file  ?Food Insecurity: No Food Insecurity  ? Worried About Charity fundraiser in the Last Year: Never true  ? Ran Out of Food in the Last Year: Never true  ?Transportation Needs: No Transportation Needs  ? Lack of Transportation (Medical): No  ? Lack of Transportation (Non-Medical): No  ?Physical Activity: Not on file  ?Stress: Not on file  ?Social Connections: Not on file  ?Intimate Partner Violence: Not on file  ? ? ?Physical Exam ? ? ? ? ? ?No future appointments. ? ? ? ? ?Renee Ramus, EMT-Paramedic ?[phone] ?Community Health Paramedic  ?10/20/21  ?

## 2021-10-25 DIAGNOSIS — J45998 Other asthma: Secondary | ICD-10-CM | POA: Diagnosis not present

## 2021-10-25 DIAGNOSIS — G4731 Primary central sleep apnea: Secondary | ICD-10-CM | POA: Diagnosis not present

## 2021-10-31 ENCOUNTER — Other Ambulatory Visit (HOSPITAL_COMMUNITY): Payer: Self-pay | Admitting: Emergency Medicine

## 2021-10-31 NOTE — Progress Notes (Signed)
Paramedicine Encounter    Patient ID: Kristopher [name], male    DOB: 08-04-1950, 71 y.o.   MRN: [id number]  Met with Kristopher [name] this morning.  He reports no SOB or C/P.  No edema to his lower extremities.  Lung sounds clear and equal bilat.  Pt. Does complain today of pain in his rt back, hip and rt leg that increases w/ movement.  He said this started last night and thinks he might have overdone it yesterday.  He also requested that I assist him with getting his meds refilled and I was able to get in touch with [facility] to get refills and he advises he will pick them up on Thursday.  He is out of refills on his Symbicort and I am working on getting him assistance with this med due to cost.   BP (!) 150/80 (BP Location: Left Arm, Patient Position: Sitting, Cuff Size: Normal)   Pulse 99   Resp 16   Wt 232 lb 12.8 oz (105.6 kg)   SpO2 96%   BMI 36.46 kg/m  Weight yesterday did not take Last visit weight-229.3lbs   Patient Care Team: Maurice Small, MD as PCP - General (Family Medicine) Sueanne Margarita, MD as PCP - Cardiology (Cardiology) Bensimhon, Shaune Pascal, MD as PCP - Advanced Heart Failure (Cardiology) Jorge Ny, LCSW as Social Worker (Licensed Clinical Social Worker)  Patient Active Problem List   Diagnosis Date Noted   COPD exacerbation ([place]) 05/03/2021   Hypoxemia 05/03/2021   Prolonged QT interval 05/03/2021   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   AKI (acute kidney injury) ([place]) 02/27/2021   IDA (iron deficiency anemia) 02/27/2021   Acute on chronic respiratory failure with hypercapnia ([place]) 02/22/2021   Symptomatic anemia 11/06/2019   Thrombocytosis 11/06/2019   COPD (chronic obstructive pulmonary disease) ([place]) 11/06/2019   Dyslipidemia 11/06/2019   OSA (obstructive sleep apnea) 11/18/2018   Morbid obesity ([place]) 11/18/2018   PUD (peptic ulcer disease) 08/17/2016   Acute esophagitis 08/17/2016   Pulmonary HTN ([place]) 08/17/2016   Lymphadenopathy 08/17/2016   CAD (coronary  artery disease) 08/16/2016   Anemia    DCM (dilated cardiomyopathy) ([place])    HTN (hypertension)    Chronic combined systolic and diastolic heart failure ([place]) 08/11/2016   Acute blood loss anemia 08/11/2016   Acute upper GI bleed 08/10/2016   DOE (dyspnea on exertion) 08/10/2016   LBBB (left bundle branch block) 08/10/2016    Current Outpatient Medications:    acetaminophen (TYLENOL) 325 MG tablet, Take 650 mg by mouth every 6 (six) hours as needed for mild pain. , Disp: , Rfl:    aspirin 81 MG chewable tablet, Chew 1 tablet (81 mg total) by mouth daily., Disp: 30 tablet, Rfl: 6   atorvastatin (LIPITOR) 40 MG tablet, Take 1 tablet (40 mg total) by mouth daily., Disp: 90 tablet, Rfl: 3   budesonide-formoterol (SYMBICORT) 160-4.5 MCG/ACT inhaler, Inhale 2 puffs into the lungs 2 (two) times daily., Disp: , Rfl:    empagliflozin (JARDIANCE) 10 MG TABS tablet, Take 1 tablet (10 mg total) by mouth daily before breakfast., Disp: 30 tablet, Rfl: 4   ferrous sulfate 325 (65 FE) MG tablet, Take 1 tablet (325 mg total) by mouth daily with breakfast., Disp: 90 tablet, Rfl: 0   furosemide (LASIX) 40 MG tablet, Take 2 tablets (80 mg total) by mouth 2 (two) times daily., Disp: 120 tablet, Rfl: 5   magnesium hydroxide (MILK OF MAGNESIA) 400 MG/5ML suspension, Take 30 mLs by  mouth See admin instructions. Tuesday and Thursday as needed, Disp: , Rfl:    pantoprazole (PROTONIX) 40 MG tablet, Take 1 tablet (40 mg total) by mouth daily., Disp: 90 tablet, Rfl: 3   sacubitril-valsartan (ENTRESTO) 97-103 MG, Take 1 tablet by mouth 2 (two) times daily., Disp: 60 tablet, Rfl: 6   spironolactone (ALDACTONE) 25 MG tablet, Take 1 tablet (25 mg total) by mouth daily., Disp: 90 tablet, Rfl: 3   albuterol (PROVENTIL) (2.5 MG/3ML) 0.083% nebulizer solution, Inhale 3 mLs into the lungs in the morning and at bedtime. (Patient not taking: Reported on 10/31/2021), Disp: , Rfl:  Allergies  Allergen Reactions   Penicillins Other  (See Comments)    Pt tolerated ceftriaxone. Pt reports it makes him feel shaky Has patient had a PCN reaction causing immediate rash, facial/tongue/throat swelling, SOB or lightheadedness with hypotension: YES Has patient had a PCN reaction causing severe rash involving mucus membranes or skin necrosis: NO Has patient had a PCN reaction that required hospitalization NO Has patient had a PCN reaction occurring within the last 10 years: NO If all of the above answers are "NO", then may proceed with Cephalosporin use.      Social History   Socioeconomic History   Marital status: Single    Spouse name: Not on file   Number of children: Not on file   Years of education: Not on file   Highest education level: Not on file  Occupational History   Occupation: retired  Tobacco Use   Smoking status: Former    Packs/day: 0.50    Years: 44.00    Pack years: 22.00    Types: Cigarettes    Quit date: 06/11/1998    Years since quitting: 23.4   Smokeless tobacco: Never  Vaping Use   Vaping Use: Never used  Substance and Sexual Activity   Alcohol use: Not Currently    Comment: h/o heavy use   Drug use: Not Currently   Sexual activity: Not on file  Other Topics Concern   Not on file  Social History Narrative   Not on file   Social Determinants of Health   Financial Resource Strain: Not on file  Food Insecurity: No Food Insecurity   Worried About Charity fundraiser in the Last Year: Never true   [place] in the Last Year: Never true  Transportation Needs: No Transportation Needs   Lack of Transportation (Medical): No   Lack of Transportation (Non-Medical): No  Physical Activity: Not on file  Stress: Not on file  Social Connections: Not on file  Intimate Partner Violence: Not on file    Physical Exam      No future appointments.     Renee Ramus, [facility] Paramedic  10/31/21

## 2021-11-07 ENCOUNTER — Encounter (HOSPITAL_COMMUNITY): Payer: Self-pay

## 2021-11-07 ENCOUNTER — Other Ambulatory Visit (HOSPITAL_COMMUNITY): Payer: Self-pay | Admitting: Emergency Medicine

## 2021-11-07 NOTE — Progress Notes (Unsigned)
Medication Samples have been provided to the patient.  Drug name: Jardiance       Strength: 10mg         Qty: 2 bottles  LOT: [id number]  Exp.Date: 01/25  Dosing instructions: take 1 tablet daily   The patient has been instructed regarding the correct time, dose, and frequency of taking this medication, including desired effects and most common side effects.   Colin Ellers M Mikell Kazlauskas 12:22 PM 11/07/2021

## 2021-11-07 NOTE — Progress Notes (Signed)
Paramedicine Encounter    Patient ID: Kristopher [name], male    DOB: [birth year], 71 y.o.   MRN: [id number]  ATF Mr. Sofia in great spirits w/o complaint of chest pain or SOB.  I had ordered refills for his meds which he was able to p/u all except the Jardiance which he stated he could not afford at this time.  I sent message to triage for possible sample bottle waiting on reply.  Otherwise, pt. Is compliant with all meds.  He had issues with being out of his Symbicort and I had reached out to [facility] for him and they state he has no refills and does not have appointment till July.  Inquired as to whether he could also get a sample till his upcoming appointment but have not heard back from the office yet.  Lung sounds diminished in the upper left but clear in all other lobes.  No edema noted to his lower extremities.  Pt states, "I'm really feeling pretty good and happy I haven't had to go to the hospital in a long time.  Next home visit scheduled for 11/14/21 @ 9:00  BP 110/60 (BP Location: Left Arm, Patient Position: Sitting, Cuff Size: Normal)   Pulse 100   Resp 18   Wt 232 lb (105.2 kg)   SpO2 95%   BMI 36.34 kg/m  Weight yesterday-230 Last visit weight-232  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   AKI (acute kidney injury) ([facility]) 02/27/2021   IDA (iron deficiency anemia) 02/27/2021   Acute on chronic respiratory failure with hypercapnia ([facility]) 02/22/2021   Symptomatic anemia 11/06/2019   Thrombocytosis 11/06/2019   COPD (chronic obstructive pulmonary disease) ([facility]) 11/06/2019    Dyslipidemia 11/06/2019   OSA (obstructive sleep apnea) 11/18/2018   Morbid obesity ([facility]) 11/18/2018   PUD (peptic ulcer disease) 08/17/2016   Acute esophagitis 08/17/2016   Pulmonary HTN ([facility]) 08/17/2016   Lymphadenopathy 08/17/2016   CAD (coronary artery disease) 08/16/2016   Anemia    DCM (dilated cardiomyopathy) (H[facility])    HTN (hypertension)    Chronic combined systolic and diastolic heart failure (H[facility]) 08/11/2016   Acute blood loss anemia 08/11/2016   Acute upper GI bleed 08/10/2016   DOE (dyspnea on exertion) 08/10/2016   LBBB (left bundle branch block) 08/10/2016    Current Outpatient Medications:    acetaminophen (TYLENOL) 325 MG tablet, Take 650 mg by mouth every 6 (six) hours as needed for mild pain. , Disp: , Rfl:    albuterol (PROVENTIL) (2.5 MG/3ML) 0.083% nebulizer solution, Inhale 3 mLs into the lungs in the morning and at bedtime., Disp: , Rfl:    aspirin 81 MG chewable tablet, Chew 1 tablet (81 mg total) by mouth daily., Disp: 30 tablet, Rfl: 6   atorvastatin (LIPITOR) 40 MG tablet, Take 1 tablet (40 mg total) by mouth daily., Disp: 90 tablet, Rfl: 3   empagliflozin (JARDIANCE) 10 MG TABS tablet, Take 1 tablet (10 mg total) by mouth daily before breakfast., Disp: 30 tablet, Rfl: 4   ferrous sulfate 325 (65 FE) MG tablet, Take 1 tablet (325 mg  total) by mouth daily with breakfast., Disp: 90 tablet, Rfl: 0   furosemide (LASIX) 40 MG tablet, Take 2 tablets (80 mg total) by mouth 2 (two) times daily., Disp: 120 tablet, Rfl: 5   magnesium hydroxide (MILK OF MAGNESIA) 400 MG/5ML suspension, Take 30 mLs by mouth See admin instructions. Tuesday and Thursday as needed, Disp: , Rfl:    pantoprazole (PROTONIX) 40 MG tablet, Take 1 tablet (40 mg total) by mouth daily., Disp: 90 tablet, Rfl: 3   sacubitril-valsartan (ENTRESTO) 97-103 MG, Take 1 tablet by mouth 2 (two) times daily., Disp: 60 tablet, Rfl: 6   spironolactone (ALDACTONE) 25 MG tablet, Take 1 tablet (25 mg total) by mouth  daily., Disp: 90 tablet, Rfl: 3   budesonide-formoterol (SYMBICORT) 160-4.5 MCG/ACT inhaler, Inhale 2 puffs into the lungs 2 (two) times daily. (Patient not taking: Reported on 11/07/2021), Disp: , Rfl:  Allergies  Allergen Reactions   Penicillins Other (See Comments)    Pt tolerated ceftriaxone. Pt reports it makes him feel shaky Has patient had a PCN reaction causing immediate rash, facial/tongue/throat swelling, SOB or lightheadedness with hypotension: YES Has patient had a PCN reaction causing severe rash involving mucus membranes or skin necrosis: NO Has patient had a PCN reaction that required hospitalization NO Has patient had a PCN reaction occurring within the last 10 years: NO If all of the above answers are "NO", then may proceed with Cephalosporin use.      Social History   Socioeconomic History   Marital status: Single    Spouse name: Not on file   Number of children: Not on file   Years of education: Not on file   Highest education level: Not on file  Occupational History   Occupation: retired  Tobacco Use   Smoking status: Former    Packs/day: 0.50    Years: 44.00    Pack years: 22.00    Types: Cigarettes    Quit date: 06/11/1998    Years since quitting: 23.4   Smokeless tobacco: Never  Vaping Use   Vaping Use: Never used  Substance and Sexual Activity   Alcohol use: Not Currently    Comment: h/o heavy use   Drug use: Not Currently   Sexual activity: Not on file  Other Topics Concern   Not on file  Social History Narrative   Not on file   Social Determinants of Health   Financial Resource Strain: Not on file  Food Insecurity: No Food Insecurity   Worried About Programme researcher, broadcasting/film/video in the Last Year: Never true   Ran Out of Food in the Last Year: Never true  Transportation Needs: No Transportation Needs   Lack of Transportation (Medical): No   Lack of Transportation (Non-Medical): No  Physical Activity: Not on file  Stress: Not on file  Social  Connections: Not on file  Intimate Partner Violence: Not on file    Physical Exam      No future appointments.     Beatrix Shipper, EMT-Paramedic [phone] [facility] Paramedic  11/07/21

## 2021-11-08 DIAGNOSIS — I2510 Atherosclerotic heart disease of native coronary artery without angina pectoris: Secondary | ICD-10-CM | POA: Diagnosis not present

## 2021-11-08 DIAGNOSIS — I5042 Chronic combined systolic (congestive) and diastolic (congestive) heart failure: Secondary | ICD-10-CM | POA: Diagnosis not present

## 2021-11-08 DIAGNOSIS — I10 Essential (primary) hypertension: Secondary | ICD-10-CM | POA: Diagnosis not present

## 2021-11-08 DIAGNOSIS — E1159 Type 2 diabetes mellitus with other circulatory complications: Secondary | ICD-10-CM | POA: Diagnosis not present

## 2021-11-08 DIAGNOSIS — J449 Chronic obstructive pulmonary disease, unspecified: Secondary | ICD-10-CM | POA: Diagnosis not present

## 2021-11-08 DIAGNOSIS — E785 Hyperlipidemia, unspecified: Secondary | ICD-10-CM | POA: Diagnosis not present

## 2021-11-09 DIAGNOSIS — J449 Chronic obstructive pulmonary disease, unspecified: Secondary | ICD-10-CM | POA: Diagnosis not present

## 2021-11-09 DIAGNOSIS — J9622 Acute and chronic respiratory failure with hypercapnia: Secondary | ICD-10-CM | POA: Diagnosis not present

## 2021-11-14 ENCOUNTER — Other Ambulatory Visit (HOSPITAL_COMMUNITY): Payer: Self-pay | Admitting: Emergency Medicine

## 2021-11-14 NOTE — Progress Notes (Signed)
Paramedicine Encounter    Patient ID: Kristopher [name], male    DOB: [birth year], 71 y.o.   MRN: [id number]  Met with Kristopher [name] today and he was just getting out of bed when I arrived.  He stated that he stayed up watching TV till 4:15 a.m.   He has not had any of his medications yet this morning and was hypertensive @ 150/76.  He denies chest pain or SOB.  Lung sounds clear and no wheezing.  No edema noted to his lower extremities.  Pt. Is compliant with all medications.  He has visited his PCP to get financial assistance for his Symbicort.  Medication reviewed and no further needs at this time.  BP (!) 150/76 (BP Location: Left Arm, Patient Position: Standing, Cuff Size: Normal)   Pulse 100   Resp 18   Wt 232 lb (105.2 kg)   SpO2 93%   BMI 36.34 kg/m  Weight yesterday-232lb Last visit weight-232lb  Patient Care Team: Maurice Small, MD as PCP - General (Family Medicine) Sueanne Margarita, MD as PCP - Cardiology (Cardiology) Bensimhon, Shaune Pascal, MD as PCP - Advanced Heart Failure (Cardiology) Jorge Ny, LCSW as Social Worker (Licensed Clinical Social Worker)  Patient Active Problem List   Diagnosis Date Noted   COPD exacerbation ([place]) 05/03/2021   Hypoxemia 05/03/2021   Prolonged QT interval 05/03/2021   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   AKI (acute kidney injury) ([place]) 02/27/2021   IDA (iron deficiency anemia) 02/27/2021   Acute on chronic respiratory failure with hypercapnia ([place]) 02/22/2021   Symptomatic anemia 11/06/2019   Thrombocytosis 11/06/2019   COPD (chronic obstructive pulmonary disease) ([place]) 11/06/2019   Dyslipidemia 11/06/2019   OSA (obstructive sleep apnea) 11/18/2018   Morbid obesity ([place]) 11/18/2018   PUD (peptic ulcer disease) 08/17/2016   Acute esophagitis 08/17/2016   Pulmonary HTN ([place]) 08/17/2016   Lymphadenopathy 08/17/2016   CAD (coronary artery disease) 08/16/2016   Anemia    DCM (dilated cardiomyopathy) ([place])    HTN (hypertension)    Chronic combined  systolic and diastolic heart failure ([place]) 08/11/2016   Acute blood loss anemia 08/11/2016   Acute upper GI bleed 08/10/2016   DOE (dyspnea on exertion) 08/10/2016   LBBB (left bundle branch block) 08/10/2016    Current Outpatient Medications:    acetaminophen (TYLENOL) 325 MG tablet, Take 650 mg by mouth every 6 (six) hours as needed for mild pain. , Disp: , Rfl:    aspirin 81 MG chewable tablet, Chew 1 tablet (81 mg total) by mouth daily., Disp: 30 tablet, Rfl: 6   atorvastatin (LIPITOR) 40 MG tablet, Take 1 tablet (40 mg total) by mouth daily., Disp: 90 tablet, Rfl: 3   budesonide-formoterol (SYMBICORT) 160-4.5 MCG/ACT inhaler, Inhale 2 puffs into the lungs 2 (two) times daily., Disp: , Rfl:    empagliflozin (JARDIANCE) 10 MG TABS tablet, Take 1 tablet (10 mg total) by mouth daily before breakfast., Disp: 30 tablet, Rfl: 4   ferrous sulfate 325 (65 FE) MG tablet, Take 1 tablet (325 mg total) by mouth daily with breakfast., Disp: 90 tablet, Rfl: 0   furosemide (LASIX) 40 MG tablet, Take 2 tablets (80 mg total) by mouth 2 (two) times daily., Disp: 120 tablet, Rfl: 5   magnesium hydroxide (MILK OF MAGNESIA) 400 MG/5ML suspension, Take 30 mLs by mouth See admin instructions. Tuesday and Thursday as needed, Disp: , Rfl:    pantoprazole (PROTONIX) 40 MG tablet, Take 1 tablet (40 mg total) by mouth daily., Disp:  90 tablet, Rfl: 3   sacubitril-valsartan (ENTRESTO) 97-103 MG, Take 1 tablet by mouth 2 (two) times daily., Disp: 60 tablet, Rfl: 6   spironolactone (ALDACTONE) 25 MG tablet, Take 1 tablet (25 mg total) by mouth daily., Disp: 90 tablet, Rfl: 3   albuterol (PROVENTIL) (2.5 MG/3ML) 0.083% nebulizer solution, Inhale 3 mLs into the lungs in the morning and at bedtime., Disp: , Rfl:  Allergies  Allergen Reactions   Penicillins Other (See Comments)    Pt tolerated ceftriaxone. Pt reports it makes him feel shaky Has patient had a PCN reaction causing immediate rash, facial/tongue/throat  swelling, SOB or lightheadedness with hypotension: YES Has patient had a PCN reaction causing severe rash involving mucus membranes or skin necrosis: NO Has patient had a PCN reaction that required hospitalization NO Has patient had a PCN reaction occurring within the last 10 years: NO If all of the above answers are "NO", then may proceed with Cephalosporin use.      Social History   Socioeconomic History   Marital status: Single    Spouse name: Not on file   Number of children: Not on file   Years of education: Not on file   Highest education level: Not on file  Occupational History   Occupation: retired  Tobacco Use   Smoking status: Former    Packs/day: 0.50    Years: 44.00    Pack years: 22.00    Types: Cigarettes    Quit date: 06/11/1998    Years since quitting: 23.4   Smokeless tobacco: Never  Vaping Use   Vaping Use: Never used  Substance and Sexual Activity   Alcohol use: Not Currently    Comment: h/o heavy use   Drug use: Not Currently   Sexual activity: Not on file  Other Topics Concern   Not on file  Social History Narrative   Not on file   Social Determinants of Health   Financial Resource Strain: Not on file  Food Insecurity: No Food Insecurity   Worried About Charity fundraiser in the Last Year: Never true   [place] in the Last Year: Never true  Transportation Needs: No Transportation Needs   Lack of Transportation (Medical): No   Lack of Transportation (Non-Medical): No  Physical Activity: Not on file  Stress: Not on file  Social Connections: Not on file  Intimate Partner Violence: Not on file    Physical Exam      No future appointments.     Renee Ramus, [place] [facility] Paramedic  11/14/21

## 2021-11-21 ENCOUNTER — Other Ambulatory Visit (HOSPITAL_COMMUNITY): Payer: Self-pay | Admitting: Emergency Medicine

## 2021-11-21 ENCOUNTER — Telehealth (HOSPITAL_COMMUNITY): Payer: Self-pay | Admitting: Emergency Medicine
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
## 2021-11-25 DIAGNOSIS — G4731 Primary central sleep apnea: Secondary | ICD-10-CM | POA: Diagnosis not present

## 2021-11-25 DIAGNOSIS — J45998 Other asthma: Secondary | ICD-10-CM | POA: Diagnosis not present

## 2021-12-05 ENCOUNTER — Other Ambulatory Visit (HOSPITAL_COMMUNITY): Payer: Self-pay | Admitting: Emergency Medicine

## 2021-12-05 DIAGNOSIS — E785 Hyperlipidemia, unspecified: Secondary | ICD-10-CM | POA: Diagnosis not present

## 2021-12-05 DIAGNOSIS — J449 Chronic obstructive pulmonary disease, unspecified: Secondary | ICD-10-CM | POA: Diagnosis not present

## 2021-12-05 DIAGNOSIS — I2510 Atherosclerotic heart disease of native coronary artery without angina pectoris: Secondary | ICD-10-CM | POA: Diagnosis not present

## 2021-12-05 DIAGNOSIS — I10 Essential (primary) hypertension: Secondary | ICD-10-CM | POA: Diagnosis not present

## 2021-12-05 DIAGNOSIS — E1159 Type 2 diabetes mellitus with other circulatory complications: Secondary | ICD-10-CM | POA: Diagnosis not present

## 2021-12-05 DIAGNOSIS — I5042 Chronic combined systolic (congestive) and diastolic (congestive) heart failure: Secondary | ICD-10-CM | POA: Diagnosis not present

## 2021-12-19 ENCOUNTER — Other Ambulatory Visit (HOSPITAL_COMMUNITY): Payer: Self-pay | Admitting: Emergency Medicine

## 2021-12-19 NOTE — Progress Notes (Signed)
Paramedicine Encounter    Patient ID: Kristopher [name], male    DOB: [birth year], 71 y.o.   MRN: [id number]   BP 130/70 (BP Location: Left Arm, Patient Position: Sitting, Cuff Size: Normal)   Pulse 98   Resp 16   Wt 228 lb 6.4 oz (103.6 kg)   SpO2 93%   BMI 35.77 kg/m  Weight yesterday-not taken Last visit weight-230lb   ATF [name] A&Ox 4, skin W&D w/ good color. He denies chest pain or SOB.  No edema or JVD.  Lung sounds clear and equal bilat.  Pt reports to be feeling good except for some chronic back which he's been dealing with for a couple of weeks and has made his PCP aware of same.  Medications reviewed and he is compliant with all.  I assisted him with contacting [employer] regarding a OTC benefit card and requested a new card and catalog of items it can be used for.  He learned that he gets $65.00 per quarter to purchase various OTC items.  I also advised him that I would be discharging him from our paramedicine program but advised he could reach out should he have future needs or questions.  Home visit complete.    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   AKI (acute kidney injury) ([facility]) 02/27/2021   IDA (iron deficiency anemia) 02/27/2021   Acute on chronic respiratory failure with hypercapnia ([facility]CC) 02/22/2021   Symptomatic anemia 11/06/2019   Thrombocytosis 11/06/2019   COPD (chronic obstructive pulmonary disease) ([facility]CC) 11/06/2019   Dyslipidemia 11/06/2019   OSA (obstructive sleep apnea) 11/18/2018   Morbid obesity ([facility]CC) 11/18/2018   PUD (peptic ulcer disease) 08/17/2016   Acute  esophagitis 08/17/2016   Pulmonary HTN ([facility]CC) 08/17/2016   Lymphadenopathy 08/17/2016   CAD (coronary artery disease) 08/16/2016   Anemia    DCM (dilated cardiomyopathy) ([facility]CC)    HTN (hypertension)    Chronic combined systolic and diastolic heart failure (HCC) 08/11/2016   Acute blood loss anemia 08/11/2016   Acute upper GI bleed 08/10/2016   DOE (dyspnea on exertion) 08/10/2016   LBBB (left bundle branch block) 08/10/2016    Current Outpatient Medications:    acetaminophen (TYLENOL) 325 MG tablet, Take 650 mg by mouth every 6 (six) hours as needed for mild pain. , Disp: , Rfl:    albuterol (PROVENTIL) (2.5 MG/3ML) 0.083% nebulizer solution, Inhale 3 mLs into the lungs in the morning and at bedtime., Disp: , Rfl:    aspirin 81 MG chewable tablet, Chew 1 tablet (81 mg total) by mouth daily., Disp: 30 tablet, Rfl: 6   atorvastatin (LIPITOR) 40 MG tablet, Take 1 tablet (40 mg total) by mouth daily., Disp: 90 tablet, Rfl: 3   budesonide-formoterol (SYMBICORT) 160-4.5 MCG/ACT inhaler, Inhale 2 puffs into the lungs 2 (two) times daily., Disp: , Rfl:    empagliflozin (JARDIANCE) 10 MG TABS tablet, Take 1 tablet (10 mg total) by mouth daily before breakfast., Disp: 30 tablet, Rfl: 4   ferrous sulfate 325 (65 FE) MG tablet, Take 1 tablet (325 mg total) by mouth daily with breakfast., Disp:  90 tablet, Rfl: 0   furosemide (LASIX) 40 MG tablet, Take 2 tablets (80 mg total) by mouth 2 (two) times daily., Disp: 120 tablet, Rfl: 5   magnesium hydroxide (MILK OF MAGNESIA) 400 MG/5ML suspension, Take 30 mLs by mouth See admin instructions. Tuesday and Thursday as needed, Disp: , Rfl:    pantoprazole (PROTONIX) 40 MG tablet, Take 1 tablet (40 mg total) by mouth daily., Disp: 90 tablet, Rfl: 3   sacubitril-valsartan (ENTRESTO) 97-103 MG, Take 1 tablet by mouth 2 (two) times daily., Disp: 60 tablet, Rfl: 6   spironolactone (ALDACTONE) 25 MG tablet, Take 1 tablet (25 mg total) by mouth daily., Disp: 90 tablet,  Rfl: 3 Allergies  Allergen Reactions   Penicillins Other (See Comments)    Pt tolerated ceftriaxone. Pt reports it makes him feel shaky Has patient had a PCN reaction causing immediate rash, facial/tongue/throat swelling, SOB or lightheadedness with hypotension: YES Has patient had a PCN reaction causing severe rash involving mucus membranes or skin necrosis: NO Has patient had a PCN reaction that required hospitalization NO Has patient had a PCN reaction occurring within the last 10 years: NO If all of the above answers are "NO", then may proceed with Cephalosporin use.      Social History   Socioeconomic History   Marital status: Single    Spouse name: Not on file   Number of children: Not on file   Years of education: Not on file   Highest education level: Not on file  Occupational History   Occupation: retired  Tobacco Use   Smoking status: Former    Packs/day: 0.50    Years: 44.00    Total pack years: 22.00    Types: Cigarettes    Quit date: 06/11/1998    Years since quitting: 23.5   Smokeless tobacco: Never  Vaping Use   Vaping Use: Never used  Substance and Sexual Activity   Alcohol use: Not Currently    Comment: h/o heavy use   Drug use: Not Currently   Sexual activity: Not on file  Other Topics Concern   Not on file  Social History Narrative   Not on file   Social Determinants of Health   Financial Resource Strain: Not on file  Food Insecurity: No Food Insecurity (08/15/2021)   Hunger Vital Sign    Worried About Running Out of Food in the Last Year: Never true    Ran Out of Food in the Last Year: Never true  Transportation Needs: No Transportation Needs (08/15/2021)   PRAPARE - Administrator, Civil Service (Medical): No    Lack of Transportation (Non-Medical): No  Physical Activity: Not on file  Stress: Not on file  Social Connections: Not on file  Intimate Partner Violence: Not on file    Physical Exam      No future  appointments.     Beatrix Shipper, EMT-Paramedic [phone] [facility] Paramedic  12/19/21

## 2022-01-05 DIAGNOSIS — I2510 Atherosclerotic heart disease of native coronary artery without angina pectoris: Secondary | ICD-10-CM | POA: Diagnosis not present

## 2022-01-05 DIAGNOSIS — L309 Dermatitis, unspecified: Secondary | ICD-10-CM | POA: Diagnosis not present

## 2022-01-05 DIAGNOSIS — I5042 Chronic combined systolic (congestive) and diastolic (congestive) heart failure: Secondary | ICD-10-CM | POA: Diagnosis not present

## 2022-01-05 DIAGNOSIS — I272 Pulmonary hypertension, unspecified: Secondary | ICD-10-CM | POA: Diagnosis not present

## 2022-01-05 DIAGNOSIS — E1159 Type 2 diabetes mellitus with other circulatory complications: Secondary | ICD-10-CM | POA: Diagnosis not present

## 2022-01-05 DIAGNOSIS — E785 Hyperlipidemia, unspecified: Secondary | ICD-10-CM | POA: Diagnosis not present

## 2022-01-05 DIAGNOSIS — G4733 Obstructive sleep apnea (adult) (pediatric): Secondary | ICD-10-CM | POA: Diagnosis not present

## 2022-01-05 DIAGNOSIS — I10 Essential (primary) hypertension: Secondary | ICD-10-CM | POA: Diagnosis not present

## 2022-01-05 DIAGNOSIS — D509 Iron deficiency anemia, unspecified: Secondary | ICD-10-CM | POA: Diagnosis not present

## 2022-01-05 DIAGNOSIS — J449 Chronic obstructive pulmonary disease, unspecified: Secondary | ICD-10-CM | POA: Diagnosis not present

## 2022-01-05 DIAGNOSIS — Z0000 Encounter for general adult medical examination without abnormal findings: Secondary | ICD-10-CM | POA: Diagnosis not present

## 2022-01-09 ENCOUNTER — Telehealth: Payer: Self-pay | Admitting: Hematology and Oncology

## 2022-01-09 NOTE — Telephone Encounter (Signed)
Scheduled appt per 7/31 referral. Pt is aware of appt date and time. Pt is aware to arrive 15 mins prior to appt time and to bring and updated insurance card. Pt is aware of appt location.   

## 2022-01-11 ENCOUNTER — Inpatient Hospital Stay: Payer: Medicare Other

## 2022-01-11 ENCOUNTER — Inpatient Hospital Stay: Payer: Medicare Other | Attending: Hematology and Oncology | Admitting: Hematology and Oncology

## 2022-01-15 ENCOUNTER — Telehealth: Payer: Self-pay | Admitting: Hematology and Oncology

## 2022-01-15 NOTE — Telephone Encounter (Signed)
Called pt to r/s missed new hem appt. Pt declined to r/s at this time, he stated he was feeling better and no longer needed this appt. Referral closed.

## 2022-01-17 ENCOUNTER — Emergency Department (HOSPITAL_COMMUNITY)
Admission: EM | Admit: 2022-01-17 | Discharge: 2022-01-17 | Disposition: A | Discharge status: Home / Self Care | Payer: Medicare Other | Attending: Physician Assistant | Admitting: Physician Assistant

## 2022-01-17 ENCOUNTER — Other Ambulatory Visit: Payer: Self-pay

## 2022-01-17 ENCOUNTER — Encounter (HOSPITAL_COMMUNITY): Payer: Self-pay | Admitting: *Deleted

## 2022-01-17 DIAGNOSIS — Z5321 Procedure and treatment not carried out due to patient leaving prior to being seen by health care provider: Secondary | ICD-10-CM | POA: Insufficient documentation

## 2022-01-17 DIAGNOSIS — D509 Iron deficiency anemia, unspecified: Secondary | ICD-10-CM | POA: Diagnosis not present

## 2022-01-17 DIAGNOSIS — D649 Anemia, unspecified: Secondary | ICD-10-CM | POA: Diagnosis not present

## 2022-01-17 LAB — URINALYSIS, ROUTINE W REFLEX MICROSCOPIC
Bilirubin Urine: NEGATIVE
Glucose, UA: NEGATIVE mg/dL
Hgb urine dipstick: NEGATIVE
Ketones, ur: NEGATIVE mg/dL
Leukocytes,Ua: NEGATIVE
Nitrite: NEGATIVE
Protein, ur: NEGATIVE mg/dL
Specific Gravity, Urine: 1.011 (ref 1.005–1.030)
pH: 6 (ref 5.0–8.0)

## 2022-01-17 LAB — CBC WITH DIFFERENTIAL/PLATELET
Abs Immature Granulocytes: 0.05 10*3/uL (ref 0.00–0.07)
Basophils Absolute: 0.1 10*3/uL (ref 0.0–0.1)
Basophils Relative: 1 %
Eosinophils Absolute: 0.1 10*3/uL (ref 0.0–0.5)
Eosinophils Relative: 1 %
HCT: 28.6 % — ABNORMAL LOW (ref 39.0–52.0)
Hemoglobin: 7.7 g/dL — ABNORMAL LOW (ref 13.0–17.0)
Immature Granulocytes: 1 %
Lymphocytes Relative: 15 %
Lymphs Abs: 1.7 10*3/uL (ref 0.7–4.0)
MCH: 21 pg — ABNORMAL LOW (ref 26.0–34.0)
MCHC: 26.9 g/dL — ABNORMAL LOW (ref 30.0–36.0)
MCV: 77.9 fL — ABNORMAL LOW (ref 80.0–100.0)
Monocytes Absolute: 0.9 10*3/uL (ref 0.1–1.0)
Monocytes Relative: 8 %
Neutro Abs: 8.3 10*3/uL — ABNORMAL HIGH (ref 1.7–7.7)
Neutrophils Relative %: 74 %
Platelets: 466 10*3/uL — ABNORMAL HIGH (ref 150–400)
RBC: 3.67 MIL/uL — ABNORMAL LOW (ref 4.22–5.81)
RDW: 19.3 % — ABNORMAL HIGH (ref 11.5–15.5)
WBC: 11.1 10*3/uL — ABNORMAL HIGH (ref 4.0–10.5)
nRBC: 0 % (ref 0.0–0.2)

## 2022-01-17 LAB — COMPREHENSIVE METABOLIC PANEL
ALT: 7 U/L (ref 0–44)
AST: 17 U/L (ref 15–41)
Albumin: 4 g/dL (ref 3.5–5.0)
Alkaline Phosphatase: 60 U/L (ref 38–126)
Anion gap: 11 (ref 5–15)
BUN: 14 mg/dL (ref 8–23)
CO2: 26 mmol/L (ref 22–32)
Calcium: 9 mg/dL (ref 8.9–10.3)
Chloride: 99 mmol/L (ref 98–111)
Creatinine, Ser: 1.32 mg/dL — ABNORMAL HIGH (ref 0.61–1.24)
GFR, Estimated: 58 mL/min — ABNORMAL LOW (ref >60)
Glucose, Bld: 105 mg/dL — ABNORMAL HIGH (ref 70–99)
Potassium: 4 mmol/L (ref 3.5–5.1)
Sodium: 136 mmol/L (ref 135–145)
Total Bilirubin: 0.3 mg/dL (ref 0.3–1.2)
Total Protein: 7.2 g/dL (ref 6.5–8.1)

## 2022-01-17 LAB — LIPASE, BLOOD: Lipase: 47 U/L (ref 11–51)

## 2022-01-17 NOTE — ED Triage Notes (Signed)
He had a appointment Monday had blood drawn and he was called back to day had blood drawn again  and he was told to come to the ed bllood was low.  He has not had an drak stools or rectal bleeding  no pain

## 2022-01-17 NOTE — ED Provider Triage Note (Signed)
Emergency Medicine Provider Triage Evaluation Note  Kristopher H Poynor Sr. , a 71 y.o. male  was evaluated in triage.  Pt complains of low hemoglobin.  Patient was at PCPs office, had blood work done and found to have a hemoglobin of 7.5, prior history of infusions in the past.  Reports negative workup such as endoscopy and rectal exams? He is unsure on source of bleeding. Not on blood thinners.  Review of Systems  Positive:  Negative: Blood in stool, hematemesis, abdominal pain, chest pain, shortness of breath.  Physical Exam  There were no vitals taken for this visit. Gen:   Awake, no distress   Resp:  Normal effort  MSK:   Moves extremities without difficulty  Other:    Medical Decision Making  Medically screening exam initiated at 4:23 PM.  Appropriate orders placed.  Kristopher H Cockerill Sr. was informed that the remainder of the evaluation will be completed by another provider, this initial triage assessment does not replace that evaluation, and the importance of remaining in the ED until their evaluation is complete.     Kristopher Manges, PA-C 01/17/22 1627

## 2022-01-23 ENCOUNTER — Telehealth (HOSPITAL_COMMUNITY): Payer: Self-pay

## 2022-01-23 NOTE — Telephone Encounter (Signed)
Kristopher [name] reached out wanting to move his primary care to [facility] and move his medications to [facility] once he is established there. I advised him I would reach out to Kristopher [name] at [facility] to assist him to establish. She was able to set him up with Dr. Bonnita Nasuti 20th at 0930. Kristopher [name] aware and agreeable. I provided him with appointment information and address. He knows to ask for meds to be moved to [facility] and he plans to take them with him on the day of the appointment. Call complete.   Maralyn Sago, EMT-Paramedic [phone] 01/23/2022

## 2022-02-28 ENCOUNTER — Encounter: Payer: Self-pay | Admitting: Family Medicine

## 2022-02-28 ENCOUNTER — Ambulatory Visit: Payer: Medicare Other | Attending: Family Medicine | Admitting: Family Medicine

## 2022-02-28 ENCOUNTER — Other Ambulatory Visit: Payer: Self-pay

## 2022-02-28 VITALS — BP 128/76 | HR 83 | Temp 98.2°F | Ht 67.0 in | Wt 228.2 lb

## 2022-02-28 DIAGNOSIS — G4733 Obstructive sleep apnea (adult) (pediatric): Secondary | ICD-10-CM

## 2022-02-28 DIAGNOSIS — Z131 Encounter for screening for diabetes mellitus: Secondary | ICD-10-CM

## 2022-02-28 DIAGNOSIS — I110 Hypertensive heart disease with heart failure: Secondary | ICD-10-CM

## 2022-02-28 DIAGNOSIS — Z1159 Encounter for screening for other viral diseases: Secondary | ICD-10-CM | POA: Diagnosis not present

## 2022-02-28 DIAGNOSIS — I5042 Chronic combined systolic (congestive) and diastolic (congestive) heart failure: Secondary | ICD-10-CM

## 2022-02-28 DIAGNOSIS — M47897 Other spondylosis, lumbosacral region: Secondary | ICD-10-CM

## 2022-02-28 DIAGNOSIS — J449 Chronic obstructive pulmonary disease, unspecified: Secondary | ICD-10-CM | POA: Diagnosis not present

## 2022-02-28 DIAGNOSIS — D509 Iron deficiency anemia, unspecified: Secondary | ICD-10-CM

## 2022-02-28 LAB — POCT GLYCOSYLATED HEMOGLOBIN (HGB A1C): Hemoglobin A1C: 5.1 % (ref 4.0–5.6)

## 2022-02-28 MED ORDER — DICLOFENAC SODIUM 1 % EX GEL
4.0000 g | Freq: Four times a day (QID) | CUTANEOUS | 1 refills | Status: AC
  Filled 2022-02-28: qty 100, 7d supply, fill #0

## 2022-02-28 MED ORDER — LIDOCAINE 5 % EX PTCH
1.0000 | MEDICATED_PATCH | CUTANEOUS | 0 refills | Status: DC
  Filled 2022-02-28: qty 30, 30d supply, fill #0

## 2022-02-28 NOTE — Progress Notes (Signed)
Subjective:  Patient ID: Kristopher Rushing Sr., male    DOB: 08/06/1950  Age: 71 y.o. MRN: [id number]  CC: Establish Care (Establish care. No questions / care. )   HPI Kristopher H Vandevender Sr. is a 71 y.o. year old male with a history of CHF (EF 45 to 50% from echo 09/2021), nonischemic cardiomyopathy,hyperlipidemia, hypertension, severe OSA (not using a CPAP), pulmonary hypertension, COPD currently under the care of the para medicine program. Previously followed by [facility].  Interval History: Last seen by the CHF clinic in 09/2021 at which time Entresto dose was increased. Echo from 09/2021 revealed EF of 45 to 50%, LV global hypokinesis, mild to moderate concentric LVH, septal lateral dyssynchrony due to LBBB, biatrial enlargement, mild MR. Endorses adherence with his antihypertensives. Not under the care of pulmonary for management of his COPD as he states his symptoms are controlled. Does not use a CPAP as he states it ''choked him at night'  he sleeps well with no problems.  He denies having a history of DM as he states he was always told 'he was borderline'.  When he called for this appointment he had low back pain which radiated to his posterior thighs and he sometimes has difficulty straightening up. Symptoms are worse when it is cold and raining. Tylenol did not provide relief and symptoms have been present for about a month.  At the moment back does not hurt just his posterior upper thighs. He denies presence of numbness in his extremities, falls or weakness of his legs. Past Medical History:  Diagnosis Date   Acute combined systolic and diastolic heart failure (HCC) 08/11/2016   Acute esophagitis 08/17/2016   Acute upper GI bleed 08/10/2016   Asthma    CAD (coronary artery disease) 08/16/2016   CHF (congestive heart failure) (HCC)    COPD (chronic obstructive pulmonary disease) (HCC) 11/06/2019   DCM (dilated cardiomyopathy) (HCC)    Dyslipidemia 11/06/2019   Dyspnea    HTN  (hypertension)    LBBB (left bundle branch block) 08/10/2016   Lymphadenopathy 08/17/2016   OSA (obstructive sleep apnea) 11/18/2018   [facility] home sleep study revealed Severe Obstructive Sleep Apnea with AHI 65.8/hr. Central sleep apnea was noted with an pAHIc of 11.4/hr. % Cheyne Stokes respirations was 10.3%. Nocturnal Hypoxemia was noted with O2 saturations as low as 72%. Time spent with Oxygen desaturations < 88% was 97.6 minutes.   PUD (peptic ulcer disease) 08/17/2016   Pulmonary HTN (HCC) 08/17/2016    Past Surgical History:  Procedure Laterality Date   BIOPSY  11/07/2019   Procedure: BIOPSY;  Surgeon: Kristopher Der, MD;  Location: [facility];  Service: Gastroenterology;;   BIOPSY  11/08/2019   Procedure: BIOPSY;  Surgeon: Kristopher Der, MD;  Location: [facility];  Service: Gastroenterology;;   BIOPSY  02/25/2021   Procedure: BIOPSY;  Surgeon: Kristopher Der, MD;  Location: [facility];  Service: Gastroenterology;;   COLONOSCOPY WITH PROPOFOL N/A 11/08/2019   Procedure: COLONOSCOPY WITH PROPOFOL;  Surgeon: Kristopher Der, MD;  Location: [facility];  Service: Gastroenterology;  Laterality: N/A;   ESOPHAGOGASTRODUODENOSCOPY (EGD) WITH PROPOFOL Left 08/11/2016   Procedure: ESOPHAGOGASTRODUODENOSCOPY (EGD) WITH PROPOFOL;  Surgeon: Kristopher Modena, MD;  Location: [facility] ENDOSCOPY;  Service: Endoscopy;  Laterality: Left;   ESOPHAGOGASTRODUODENOSCOPY (EGD) WITH PROPOFOL Left 11/07/2019   Procedure: ESOPHAGOGASTRODUODENOSCOPY (EGD) WITH PROPOFOL;  Surgeon: Kristopher Der, MD;  Location: [facility];  Service: Gastroenterology;  Laterality: Left;   ESOPHAGOGASTRODUODENOSCOPY (EGD) WITH PROPOFOL N/A 02/25/2021   Procedure: ESOPHAGOGASTRODUODENOSCOPY (EGD)  WITH PROPOFOL;  Surgeon: Kristopher Der, MD;  Location: [facility];  Service: Gastroenterology;  Laterality: N/A;   POLYPECTOMY  11/08/2019   Procedure: POLYPECTOMY;  Surgeon: Kristopher Der, MD;  Location: [facility];  Service:  Gastroenterology;;   RIGHT/LEFT HEART CATH AND CORONARY ANGIOGRAPHY N/A 08/15/2016   Procedure: Right/Left Heart Cath and Coronary Angiography;  Surgeon: Kristopher Crafts, MD;  Location: [facility] INVASIVE CV LAB;  Service: Cardiovascular;  Laterality: N/A;   RIGHT/LEFT HEART CATH AND CORONARY ANGIOGRAPHY N/A 08/04/2018   Procedure: RIGHT/LEFT HEART CATH AND CORONARY ANGIOGRAPHY;  Surgeon: Kristopher Patty, MD;  Location: [facility] INVASIVE CV LAB;  Service: Cardiovascular;  Laterality: N/A;    Family History  Problem Relation Age of Onset   Hypertension Mother    Hypertension Sister     Social History   Socioeconomic History   Marital status: Single    Spouse name: Not on file   Number of children: Not on file   Years of education: Not on file   Highest education level: Not on file  Occupational History   Occupation: retired  Tobacco Use   Smoking status: Former    Packs/day: 0.50    Years: 44.00    Total pack years: 22.00    Types: Cigarettes    Quit date: 06/11/1998    Years since quitting: 23.7   Smokeless tobacco: Never  Vaping Use   Vaping Use: Never used  Substance and Sexual Activity   Alcohol use: Not Currently    Comment: h/o heavy use   Drug use: Not Currently   Sexual activity: Not on file  Other Topics Concern   Not on file  Social History Narrative   Not on file   Social Determinants of Health   Financial Resource Strain: Not on file  Food Insecurity: No Food Insecurity (08/15/2021)   Hunger Vital Sign    Worried About Running Out of Food in the Last Year: Never true    Ran Out of Food in the Last Year: Never true  Transportation Needs: No Transportation Needs (08/15/2021)   PRAPARE - Administrator, Civil Service (Medical): No    Lack of Transportation (Non-Medical): No  Physical Activity: Not on file  Stress: Not on file  Social Connections: Not on file    Allergies  Allergen Reactions   Penicillins Other (See Comments)    Pt tolerated  ceftriaxone. Pt reports it makes him feel shaky Has patient had a PCN reaction causing immediate rash, facial/tongue/throat swelling, SOB or lightheadedness with hypotension: YES Has patient had a PCN reaction causing severe rash involving mucus membranes or skin necrosis: NO Has patient had a PCN reaction that required hospitalization NO Has patient had a PCN reaction occurring within the last 10 years: NO If all of the above answers are "NO", then may proceed with Cephalosporin use.    Outpatient Medications Prior to Visit  Medication Sig Dispense Refill   acetaminophen (TYLENOL) 325 MG tablet Take 650 mg by mouth every 6 (six) hours as needed for mild pain.      albuterol (PROVENTIL) (2.5 MG/3ML) 0.083% nebulizer solution Inhale 3 mLs into the lungs in the morning and at bedtime.     aspirin 81 MG chewable tablet Chew 1 tablet (81 mg total) by mouth daily. 30 tablet 6   atorvastatin (LIPITOR) 40 MG tablet Take 1 tablet (40 mg total) by mouth daily. 90 tablet 3   budesonide-formoterol (SYMBICORT) 160-4.5 MCG/ACT inhaler Inhale 2 puffs into the lungs  2 (two) times daily.     ferrous sulfate 325 (65 FE) MG tablet Take 1 tablet (325 mg total) by mouth daily with breakfast. 90 tablet 0   furosemide (LASIX) 40 MG tablet Take 2 tablets (80 mg total) by mouth 2 (two) times daily. 120 tablet 5   magnesium hydroxide (MILK OF MAGNESIA) 400 MG/5ML suspension Take 30 mLs by mouth See admin instructions. Tuesday and Thursday as needed     pantoprazole (PROTONIX) 40 MG tablet Take 1 tablet (40 mg total) by mouth daily. 90 tablet 3   sacubitril-valsartan (ENTRESTO) 97-103 MG Take 1 tablet by mouth 2 (two) times daily. 60 tablet 6   spironolactone (ALDACTONE) 25 MG tablet Take 1 tablet (25 mg total) by mouth daily. 90 tablet 3   empagliflozin (JARDIANCE) 10 MG TABS tablet Take 1 tablet (10 mg total) by mouth daily before breakfast. (Patient not taking: Reported on 02/28/2022) 30 tablet 4   No  facility-administered medications prior to visit.     ROS Review of Systems  Constitutional:  Negative for activity change and appetite change.  HENT:  Negative for sinus pressure and sore throat.   Respiratory:  Negative for chest tightness, shortness of breath and wheezing.   Cardiovascular:  Negative for chest pain and palpitations.  Gastrointestinal:  Negative for abdominal distention, abdominal pain and constipation.  Genitourinary: Negative.   Musculoskeletal:        See HPI  Psychiatric/Behavioral:  Negative for behavioral problems and dysphoric mood.     Objective:  BP 128/76 (BP Location: Left Arm, Patient Position: Sitting, Cuff Size: Normal)   Pulse 83   Temp 98.2 F (36.8 C) (Oral)   Ht 5\' 7"  (1.702 m)   Wt 228 lb 3.2 oz (103.5 kg)   SpO2 94%   BMI 35.74 kg/m      02/28/2022    9:31 AM 01/17/2022    4:35 PM 01/17/2022    4:32 PM  BP/Weight  Systolic BP 128 137   Diastolic BP 76 73   Wt. (Lbs) 228.2  228.4  BMI 35.74 kg/m2  35.77 kg/m2      Physical Exam Constitutional:      Appearance: He is well-developed. He is obese.  Cardiovascular:     Rate and Rhythm: Normal rate.     Heart sounds: Normal heart sounds. No murmur heard. Pulmonary:     Effort: Pulmonary effort is normal.     Breath sounds: Normal breath sounds. No wheezing or rales.  Chest:     Chest wall: No tenderness.  Abdominal:     General: Bowel sounds are normal. There is no distension.     Palpations: Abdomen is soft. There is no mass.     Tenderness: There is no abdominal tenderness.  Musculoskeletal:        General: Normal range of motion.     Right lower leg: No edema.     Left lower leg: No edema.     Comments: No tenderness on palpation of lumbar spine Negative straight leg raise bilaterally  Neurological:     Mental Status: He is alert and oriented to person, place, and time.  Psychiatric:        Mood and Affect: Mood normal.        Latest Ref Rng & Units 01/17/2022     4:35 PM 09/18/2021   12:14 PM 08/15/2021    9:38 AM  CMP  Glucose 70 - 99 mg/dL 10/15/2021  449  675  BUN 8 - 23 mg/dL 14  13  12    Creatinine 0.61 - 1.24 mg/dL 1.32  1.25  1.12   Sodium 135 - 145 mmol/L 136  137  135   Potassium 3.5 - 5.1 mmol/L 4.0  4.3  4.0   Chloride 98 - 111 mmol/L 99  100  98   CO2 22 - 32 mmol/L 26  30  30    Calcium 8.9 - 10.3 mg/dL 9.0  9.4  8.9   Total Protein 6.5 - 8.1 g/dL 7.2     Total Bilirubin 0.3 - 1.2 mg/dL 0.3     Alkaline Phos 38 - 126 U/L 60     AST 15 - 41 U/L 17     ALT 0 - 44 U/L 7       Lipid Panel     Component Value Date/Time   CHOL 143 08/12/2016 0437   TRIG 200 (H) 07/26/2018 1214   HDL 39 (L) 08/12/2016 0437   CHOLHDL 3.7 08/12/2016 0437   VLDL 15 08/12/2016 0437   LDLCALC 89 08/12/2016 0437    CBC    Component Value Date/Time   WBC 11.1 (H) 01/17/2022 1635   RBC 3.67 (L) 01/17/2022 1635   HGB 7.7 (L) 01/17/2022 1635   HGB 10.2 (L) 08/28/2016 1331   HCT 28.6 (L) 01/17/2022 1635   HCT 34.4 (L) 08/28/2016 1331   PLT 466 (H) 01/17/2022 1635   PLT 400 (H) 08/28/2016 1331   MCV 77.9 (L) 01/17/2022 1635   MCV 75 (L) 08/28/2016 1331   MCH 21.0 (L) 01/17/2022 1635   MCHC 26.9 (L) 01/17/2022 1635   RDW 19.3 (H) 01/17/2022 1635   RDW 17.8 (H) 08/28/2016 1331   LYMPHSABS 1.7 01/17/2022 1635   MONOABS 0.9 01/17/2022 1635   EOSABS 0.1 01/17/2022 1635   BASOSABS 0.1 01/17/2022 1635    Lab Results  Component Value Date   HGBA1C 5.1 02/28/2022    Assessment & Plan:  1. Hypertensive heart disease with chronic combined systolic and diastolic congestive heart failure (HCC) EF of 45 to 50% from echo 09/2021 Euvolemic Continue GDMT with SGLT2 inhibitor, Entresto, Aldactone Not on beta-blocker due to Wenckebach on EKG per cardiology notes - LP+Non-HDL Cholesterol; Future  2. Iron deficiency anemia, unspecified iron deficiency anemia type Last hemoglobin was 7.7 one month ago His last colonoscopy from 10/2019 revealed presence of several  polyps with recommendation to repeat colonoscopy in 3 years-due in 10/2022. - CBC with Differential/Platelet; Future  3. Chronic obstructive pulmonary disease, unspecified COPD type (HCC) Stable Continue Symbicort and albuterol  4. Screening for diabetes mellitus (DM) A1c of 5.1, no history of diabetes mellitus - POCT glycosylated hemoglobin (Hb A1C)  5. Need for hepatitis C screening test - HCV Ab w Reflex to Quant PCR; Future  6. OSA (obstructive sleep apnea) Controlled He is not using his CPAP machine and does not wish to restart using it  7. Other osteoarthritis of spine, lumbosacral region Uncontrolled Counseled on role of physical therapy but he would like to try topical medications at this time Consider PT and imaging if symptoms persist Counseled on back exercises - diclofenac Sodium (VOLTAREN) 1 % GEL; Apply 4 g topically 4 (four) times daily.  Dispense: 100 g; Refill: 1 - lidocaine (LIDODERM) 5 %; Place 1 patch onto the skin daily. Remove & Discard patch within 12 hours or as directed by MD  Dispense: 30 patch; Refill: 0   Health Care Maintenance: Declines all vaccinations Meds ordered  this encounter  Medications   diclofenac Sodium (VOLTAREN) 1 % GEL    Sig: Apply 4 g topically 4 (four) times daily.    Dispense:  100 g    Refill:  1   lidocaine (LIDODERM) 5 %    Sig: Place 1 patch onto the skin daily. Remove & Discard patch within 12 hours or as directed by MD    Dispense:  30 patch    Refill:  0    Follow-up: Return in about 3 months (around 05/30/2022) for Chronic medical conditions.       Hoy Register, MD, FAAFP. [facility] [place], [place] [phone]   02/28/2022, 12:43 PM

## 2022-02-28 NOTE — Patient Instructions (Signed)

## 2022-03-01 ENCOUNTER — Ambulatory Visit: Payer: Medicare Other | Attending: Family Medicine

## 2022-03-01 DIAGNOSIS — I110 Hypertensive heart disease with heart failure: Secondary | ICD-10-CM | POA: Diagnosis not present

## 2022-03-01 DIAGNOSIS — D509 Iron deficiency anemia, unspecified: Secondary | ICD-10-CM | POA: Diagnosis not present

## 2022-03-01 DIAGNOSIS — Z1159 Encounter for screening for other viral diseases: Secondary | ICD-10-CM

## 2022-03-01 DIAGNOSIS — I5042 Chronic combined systolic (congestive) and diastolic (congestive) heart failure: Secondary | ICD-10-CM | POA: Diagnosis not present

## 2022-03-02 ENCOUNTER — Other Ambulatory Visit: Payer: Self-pay | Admitting: Family Medicine

## 2022-03-02 DIAGNOSIS — D509 Iron deficiency anemia, unspecified: Secondary | ICD-10-CM

## 2022-03-02 LAB — LP+NON-HDL CHOLESTEROL
Cholesterol, Total: 166 mg/dL (ref 100–199)
HDL: 63 mg/dL (ref >39)
LDL Chol Calc (NIH): 83 mg/dL (ref 0–99)
Total Non-HDL-Chol (LDL+VLDL): 103 mg/dL (ref 0–129)
Triglycerides: 111 mg/dL (ref 0–149)
VLDL Cholesterol Cal: 20 mg/dL (ref 5–40)

## 2022-03-02 LAB — CBC WITH DIFFERENTIAL/PLATELET
Basophils Absolute: 0.1 10*3/uL (ref 0.0–0.2)
Basos: 1 %
EOS (ABSOLUTE): 0.1 10*3/uL (ref 0.0–0.4)
Eos: 2 %
Hematocrit: 35.4 % — ABNORMAL LOW (ref 37.5–51.0)
Hemoglobin: 10.2 g/dL — ABNORMAL LOW (ref 13.0–17.7)
Immature Grans (Abs): 0 10*3/uL (ref 0.0–0.1)
Immature Granulocytes: 0 %
Lymphocytes Absolute: 2 10*3/uL (ref 0.7–3.1)
Lymphs: 25 %
MCH: 23 pg — ABNORMAL LOW (ref 26.6–33.0)
MCHC: 28.8 g/dL — ABNORMAL LOW (ref 31.5–35.7)
MCV: 80 fL (ref 79–97)
Monocytes Absolute: 0.7 10*3/uL (ref 0.1–0.9)
Monocytes: 8 %
Neutrophils Absolute: 5 10*3/uL (ref 1.4–7.0)
Neutrophils: 64 %
Platelets: 490 10*3/uL — ABNORMAL HIGH (ref 150–450)
RBC: 4.44 x10E6/uL (ref 4.14–5.80)
RDW: 16.9 % — ABNORMAL HIGH (ref 11.6–15.4)
WBC: 7.9 10*3/uL (ref 3.4–10.8)

## 2022-03-02 LAB — HCV INTERPRETATION

## 2022-03-02 LAB — HCV AB W REFLEX TO QUANT PCR: HCV Ab: NONREACTIVE

## 2022-03-19 ENCOUNTER — Ambulatory Visit: Payer: Self-pay | Admitting: *Deleted

## 2022-03-19 NOTE — Telephone Encounter (Signed)
Reason for Disposition  [1] Follow-up call to recent contact AND [2] information only call, no triage required  Answer Assessment - Initial Assessment Questions 1. REASON FOR CALL or QUESTION: "What is your reason for calling today?" or "How can I best help you?" or "What question do you have that I can help answer?"     Pt returned call from a letter he received to call about his lab results.   Read him the message from Dr. Margarita Rana dated 03/02/2022 at 3:45 PM.  No questions from pt.  Protocols used: Information Only Call - No Triage-A-AH

## 2022-04-26 ENCOUNTER — Other Ambulatory Visit: Payer: Self-pay

## 2022-04-26 ENCOUNTER — Other Ambulatory Visit: Payer: Self-pay | Admitting: Pharmacist

## 2022-04-26 MED ORDER — ATORVASTATIN CALCIUM 40 MG PO TABS
40.0000 mg | ORAL_TABLET | Freq: Every day | ORAL | 1 refills | Status: AC
Start: 2022-04-26 — End: ?
  Filled 2022-04-26: qty 90, 90d supply, fill #0
  Filled 2022-07-31: qty 90, 90d supply, fill #1

## 2022-04-26 MED ORDER — PANTOPRAZOLE SODIUM 40 MG PO TBEC
40.0000 mg | DELAYED_RELEASE_TABLET | Freq: Every day | ORAL | 1 refills | Status: AC
Start: 2022-04-26 — End: ?
  Filled 2022-04-26 – 2022-05-09 (×3): qty 90, 90d supply, fill #0
  Filled 2022-07-31: qty 90, 90d supply, fill #1

## 2022-04-27 ENCOUNTER — Other Ambulatory Visit: Payer: Self-pay

## 2022-05-09 ENCOUNTER — Other Ambulatory Visit: Payer: Self-pay

## 2022-05-17 ENCOUNTER — Other Ambulatory Visit: Payer: Self-pay | Admitting: Family Medicine

## 2022-05-17 ENCOUNTER — Other Ambulatory Visit (HOSPITAL_COMMUNITY): Payer: Self-pay | Admitting: Internal Medicine

## 2022-05-17 ENCOUNTER — Other Ambulatory Visit: Payer: Self-pay

## 2022-05-17 NOTE — Telephone Encounter (Signed)
Requested medications are due for refill today.  unsure  Requested medications are on the active medications list.  yes  Last refill. 07/19/2021 #120 5 rf  Future visit scheduled.   yes  Notes to clinic.  Med on med list and discontinued. Please review for refill.    Requested Prescriptions  Pending Prescriptions Disp Refills   furosemide (LASIX) 40 MG tablet 180 tablet 0    Sig: Take 1 tablet (40 mg total) by mouth 2 (two) times daily.     Cardiovascular:  Diuretics - Loop Failed - 05/17/2022  2:07 PM      Failed - Cr in normal range and within 180 days    Creatinine, Ser  Date Value Ref Range Status  01/17/2022 1.32 (H) 0.61 - 1.24 mg/dL Final         Failed - Mg Level in normal range and within 180 days    Magnesium  Date Value Ref Range Status  05/07/2021 2.6 (H) 1.7 - 2.4 mg/dL Final    Comment:    Performed at [facility], [address] [place], [place] [address]         Passed - K in normal range and within 180 days    Potassium  Date Value Ref Range Status  01/17/2022 4.0 3.5 - 5.1 mmol/L Final         Passed - Ca in normal range and within 180 days    Calcium  Date Value Ref Range Status  01/17/2022 9.0 8.9 - 10.3 mg/dL Final   Calcium, Ion  Date Value Ref Range Status  05/03/2021 1.07 (L) 1.15 - 1.40 mmol/L Final         Passed - Na in normal range and within 180 days    Sodium  Date Value Ref Range Status  01/17/2022 136 135 - 145 mmol/L Final  11/19/2016 138 134 - 144 mmol/L Final         Passed - Cl in normal range and within 180 days    Chloride  Date Value Ref Range Status  01/17/2022 99 98 - 111 mmol/L Final         Passed - Last BP in normal range    BP Readings from Last 1 Encounters:  02/28/22 128/76         Passed - Valid encounter within last 6 months    Recent Outpatient Visits           2 months ago Screening for diabetes mellitus (DM)   [facility] Hoy Register, MD       Future  Appointments             In 2 weeks Hoy Register, MD [facility]

## 2022-05-18 ENCOUNTER — Other Ambulatory Visit: Payer: Self-pay

## 2022-05-18 MED ORDER — FUROSEMIDE 40 MG PO TABS
80.0000 mg | ORAL_TABLET | Freq: Two times a day (BID) | ORAL | 1 refills | Status: AC
  Filled 2022-05-18: qty 120, 30d supply, fill #0
  Filled 2022-07-13: qty 120, 30d supply, fill #1

## 2022-05-21 ENCOUNTER — Other Ambulatory Visit: Payer: Self-pay

## 2022-05-28 ENCOUNTER — Other Ambulatory Visit: Payer: Self-pay

## 2022-05-31 ENCOUNTER — Ambulatory Visit: Payer: Medicare Other | Attending: Family Medicine | Admitting: Family Medicine

## 2022-05-31 ENCOUNTER — Other Ambulatory Visit: Payer: Self-pay

## 2022-05-31 ENCOUNTER — Encounter: Payer: Self-pay | Admitting: Family Medicine

## 2022-05-31 VITALS — BP 130/72 | HR 84 | Temp 98.0°F | Ht 67.0 in | Wt 233.6 lb

## 2022-05-31 DIAGNOSIS — D509 Iron deficiency anemia, unspecified: Secondary | ICD-10-CM | POA: Diagnosis not present

## 2022-05-31 DIAGNOSIS — I5042 Chronic combined systolic (congestive) and diastolic (congestive) heart failure: Secondary | ICD-10-CM | POA: Diagnosis not present

## 2022-05-31 DIAGNOSIS — G4733 Obstructive sleep apnea (adult) (pediatric): Secondary | ICD-10-CM | POA: Diagnosis not present

## 2022-05-31 DIAGNOSIS — J449 Chronic obstructive pulmonary disease, unspecified: Secondary | ICD-10-CM

## 2022-05-31 DIAGNOSIS — I110 Hypertensive heart disease with heart failure: Secondary | ICD-10-CM

## 2022-05-31 MED ORDER — FERROUS SULFATE 325 (65 FE) MG PO TABS
325.0000 mg | ORAL_TABLET | Freq: Every day | ORAL | 1 refills | Status: AC
  Filled 2022-05-31: qty 90, 90d supply, fill #0

## 2022-05-31 NOTE — Progress Notes (Signed)
Subjective:  Patient ID: Kristopher Rushing Sr., male    DOB: April 25, 1951  Age: 71 y.o. MRN: [id number]  CC: Hypertension   HPI Kristopher TREANOR Sr. is a 71 y.o. year old male with a history of CHF (EF 45 to 50% from echo 09/2021), nonischemic cardiomyopathy,hyperlipidemia, hypertension, severe OSA (not using a CPAP), pulmonary hypertension, COPD.  Interval History:  He exercises regularly.by means of walking. Denies presence of dyspnea, pedal edema. London Pepper is on his med list but he has not been taking it. He also has Entresto and Spironolactone but he has not been taking them as he states they made him feel like he was in a daze. Sherryll Burger was expensive and he would like to take  as few pills as possible. Endorses adherence with his ferrous sulfate for anemia.  Colonoscopy from 10/2019 revealed presence of multiple polyps including sessile polyps with recommendation for 3-year recall. He has graduated from the CHF  Para medicine program.  Denies presence of additional concerns today. Past Medical History:  Diagnosis Date   Acute combined systolic and diastolic heart failure (HCC) 08/11/2016   Acute esophagitis 08/17/2016   Acute upper GI bleed 08/10/2016   Asthma    CAD (coronary artery disease) 08/16/2016   CHF (congestive heart failure) (HCC)    COPD (chronic obstructive pulmonary disease) (HCC) 11/06/2019   DCM (dilated cardiomyopathy) (HCC)    Dyslipidemia 11/06/2019   Dyspnea    HTN (hypertension)    LBBB (left bundle branch block) 08/10/2016   Lymphadenopathy 08/17/2016   OSA (obstructive sleep apnea) 11/18/2018   [place] home sleep study revealed Severe Obstructive Sleep Apnea with AHI 65.8/hr. Central sleep apnea was noted with an pAHIc of 11.4/hr. % Cheyne Stokes respirations was 10.3%. Nocturnal Hypoxemia was noted with O2 saturations as low as 72%. Time spent with Oxygen desaturations < 88% was 97.6 minutes.   PUD (peptic ulcer disease) 08/17/2016   Pulmonary HTN (HCC) 08/17/2016    Past  Surgical History:  Procedure Laterality Date   BIOPSY  11/07/2019   Procedure: BIOPSY;  Surgeon: [name], MD;  Location: [facility];  Service: Gastroenterology;;   BIOPSY  11/08/2019   Procedure: BIOPSY;  Surgeon: [name], MD;  Location: [facility];  Service: Gastroenterology;;   BIOPSY  02/25/2021   Procedure: BIOPSY;  Surgeon: [name], MD;  Location: [facility];  Service: Gastroenterology;;   COLONOSCOPY WITH PROPOFOL N/A 11/08/2019   Procedure: COLONOSCOPY WITH PROPOFOL;  Surgeon: [name], MD;  Location: [facility];  Service: Gastroenterology;  Laterality: N/A;   ESOPHAGOGASTRODUODENOSCOPY (EGD) WITH PROPOFOL Left 08/11/2016   Procedure: ESOPHAGOGASTRODUODENOSCOPY (EGD) WITH PROPOFOL;  Surgeon: Willis Modena, MD;  Location[facility];  Service: Endoscopy;  Laterality: Left;   ESOPHAGOGASTRODUODENOSCOPY (EGD) WITH PROPOFOL Left 11/07/2019   Procedure: ESOPHAGOGASTRODUODENOSCOPY (EGD) WITH PROPOFOL;  Surgeon: [name], MD;  Location: [facility];  Service: Gastroenterology;  Laterality: Left;   ESOPHAGOGASTRODUODENOSCOPY (EGD) WITH PROPOFOL N/A 02/25/2021   Procedure: ESOPHAGOGASTRODUODENOSCOPY (EGD) WITH PROPOFOL;  Surgeon: [name], MD;  Location: [facility];  Service: Gastroenterology;  Laterality: N/A;   POLYPECTOMY  11/08/2019   Procedure: POLYPECTOMY;  Surgeon: [name], MD;  Location: [facility];  Service: Gastroenterology;;   RIGHT/LEFT HEART CATH AND CORONARY ANGIOGRAPHY N/A 08/15/2016   Procedure: Right/Left Heart Cath and Coronary Angiography;  Surgeon: Corky Crafts, MD;  Location[facility] INVASIVE CV LAB;  Service: Cardiovascular;  Laterality: N/A;   RIGHT/LEFT HEART CATH AND CORONARY ANGIOGRAPHY N/A 08/04/2018   Procedure: RIGHT/LEFT HEART CATH  AND CORONARY ANGIOGRAPHY;  Surgeon: Dolores Patty, MD;  Location: [facility] INVASIVE CV LAB;  Service: Cardiovascular;  Laterality: N/A;    Family History  Problem  Relation Age of Onset   Hypertension Mother    Hypertension Sister     Social History   Socioeconomic History   Marital status: Single    Spouse name: Not on file   Number of children: Not on file   Years of education: Not on file   Highest education level: Not on file  Occupational History   Occupation: retired  Tobacco Use   Smoking status: Former    Packs/day: 0.50    Years: 44.00    Total pack years: 22.00    Types: Cigarettes    Quit date: 06/11/1998    Years since quitting: 23.9   Smokeless tobacco: Never  Vaping Use   Vaping Use: Never used  Substance and Sexual Activity   Alcohol use: Not Currently    Comment: h/o heavy use   Drug use: Not Currently   Sexual activity: Not on file  Other Topics Concern   Not on file  Social History Narrative   Not on file   Social Determinants of Health   Financial Resource Strain: Not on file  Food Insecurity: No Food Insecurity (08/15/2021)   Hunger Vital Sign    Worried About Running Out of Food in the Last Year: Never true    Ran Out of Food in the Last Year: Never true  Transportation Needs: No Transportation Needs (08/15/2021)   PRAPARE - Administrator, Civil Service (Medical): No    Lack of Transportation (Non-Medical): No  Physical Activity: Not on file  Stress: Not on file  Social Connections: Not on file    Allergies  Allergen Reactions   Penicillins Other (See Comments)    Pt tolerated ceftriaxone. Pt reports it makes him feel shaky Has patient had a PCN reaction causing immediate rash, facial/tongue/throat swelling, SOB or lightheadedness with hypotension: YES Has patient had a PCN reaction causing severe rash involving mucus membranes or skin necrosis: NO Has patient had a PCN reaction that required hospitalization NO Has patient had a PCN reaction occurring within the last 10 years: NO If all of the above answers are "NO", then may proceed with Cephalosporin use.    Outpatient Medications Prior  to Visit  Medication Sig Dispense Refill   acetaminophen (TYLENOL) 325 MG tablet Take 650 mg by mouth every 6 (six) hours as needed for mild pain.      aspirin 81 MG chewable tablet Chew 1 tablet (81 mg total) by mouth daily. 30 tablet 6   atorvastatin (LIPITOR) 40 MG tablet Take 1 tablet (40 mg total) by mouth daily. 90 tablet 1   budesonide-formoterol (SYMBICORT) 160-4.5 MCG/ACT inhaler Inhale 2 puffs into the lungs 2 (two) times daily.     furosemide (LASIX) 40 MG tablet Take 2 tablets (80 mg total) by mouth 2 (two) times daily. 120 tablet 1   pantoprazole (PROTONIX) 40 MG tablet Take 1 tablet (40 mg total) by mouth daily. 90 tablet 1   ferrous sulfate 325 (65 FE) MG tablet Take 1 tablet (325 mg total) by mouth daily with breakfast. 90 tablet 0   albuterol (PROVENTIL) (2.5 MG/3ML) 0.083% nebulizer solution Inhale 3 mLs into the lungs in the morning and at bedtime. (Patient not taking: Reported on 05/31/2022)     diclofenac Sodium (VOLTAREN) 1 % GEL Apply 4 g topically 4 (four) times  daily. (Patient not taking: Reported on 05/31/2022) 100 g 1   empagliflozin (JARDIANCE) 10 MG TABS tablet Take 1 tablet (10 mg total) by mouth daily before breakfast. (Patient not taking: Reported on 02/28/2022) 30 tablet 4   sacubitril-valsartan (ENTRESTO) 97-103 MG Take 1 tablet by mouth 2 (two) times daily. (Patient not taking: Reported on 05/31/2022) 60 tablet 6   spironolactone (ALDACTONE) 25 MG tablet Take 1 tablet (25 mg total) by mouth daily. (Patient not taking: Reported on 05/31/2022) 90 tablet 3   lidocaine (LIDODERM) 5 % Place 1 patch onto the skin daily. Remove & Discard patch within 12 hours or as directed by MD (Patient not taking: Reported on 05/31/2022) 30 patch 0   magnesium hydroxide (MILK OF MAGNESIA) 400 MG/5ML suspension Take 30 mLs by mouth See admin instructions. Tuesday and Thursday as needed (Patient not taking: Reported on 05/31/2022)     No facility-administered medications prior to visit.      ROS Review of Systems  Constitutional:  Negative for activity change and appetite change.  HENT:  Negative for sinus pressure and sore throat.   Respiratory:  Negative for chest tightness, shortness of breath and wheezing.   Cardiovascular:  Negative for chest pain and palpitations.  Gastrointestinal:  Negative for abdominal distention, abdominal pain and constipation.  Genitourinary: Negative.   Musculoskeletal: Negative.   Psychiatric/Behavioral:  Negative for behavioral problems and dysphoric mood.     Objective:  BP 130/72   Pulse 84   Temp 98 F (36.7 C) (Oral)   Ht 5\' 7"  (1.702 m)   Wt 233 lb 9.6 oz (106 kg)   SpO2 95%   BMI 36.59 kg/m      05/31/2022    9:32 AM 02/28/2022    9:31 AM 01/17/2022    4:35 PM  BP/Weight  Systolic BP 130 128 137  Diastolic BP 72 76 73  Wt. (Lbs) 233.6 228.2   BMI 36.59 kg/m2 35.74 kg/m2       Physical Exam Constitutional:      Appearance: He is well-developed. He is obese.  Cardiovascular:     Rate and Rhythm: Normal rate.     Heart sounds: Normal heart sounds. No murmur heard. Pulmonary:     Effort: Pulmonary effort is normal.     Breath sounds: Normal breath sounds. No wheezing or rales.  Chest:     Chest wall: No tenderness.  Abdominal:     General: Bowel sounds are normal. There is no distension.     Palpations: Abdomen is soft. There is no mass.     Tenderness: There is no abdominal tenderness.  Musculoskeletal:        General: Normal range of motion.     Right lower leg: No edema.     Left lower leg: No edema.  Neurological:     Mental Status: He is alert and oriented to person, place, and time.  Psychiatric:        Mood and Affect: Mood normal.        Latest Ref Rng & Units 01/17/2022    4:35 PM 09/18/2021   12:14 PM 08/15/2021    9:38 AM  CMP  Glucose 70 - 99 mg/dL 916  384  665   BUN 8 - 23 mg/dL 14  13  12    Creatinine 0.61 - 1.24 mg/dL 9.93  5.70  1.77   Sodium 135 - 145 mmol/L 136  137  135    Potassium 3.5 - 5.1 mmol/L 4.0  4.3  4.0   Chloride 98 - 111 mmol/L 99  100  98   CO2 22 - 32 mmol/L 26  30  30    Calcium 8.9 - 10.3 mg/dL 9.0  9.4  8.9   Total Protein 6.5 - 8.1 g/dL 7.2     Total Bilirubin 0.3 - 1.2 mg/dL 0.3     Alkaline Phos 38 - 126 U/L 60     AST 15 - 41 U/L 17     ALT 0 - 44 U/L 7       Lipid Panel     Component Value Date/Time   CHOL 166 03/01/2022 0933   TRIG 111 03/01/2022 0933   HDL 63 03/01/2022 0933   CHOLHDL 3.7 08/12/2016 0437   VLDL 15 08/12/2016 0437   LDLCALC 83 03/01/2022 0933    CBC    Component Value Date/Time   WBC 7.9 03/01/2022 0933   WBC 11.1 (H) 01/17/2022 1635   RBC 4.44 03/01/2022 0933   RBC 3.67 (L) 01/17/2022 1635   HGB 10.2 (L) 03/01/2022 0933   HCT 35.4 (L) 03/01/2022 0933   PLT 490 (H) 03/01/2022 0933   MCV 80 03/01/2022 0933   MCH 23.0 (L) 03/01/2022 0933   MCH 21.0 (L) 01/17/2022 1635   MCHC 28.8 (L) 03/01/2022 0933   MCHC 26.9 (L) 01/17/2022 1635   RDW 16.9 (H) 03/01/2022 0933   LYMPHSABS 2.0 03/01/2022 0933   MONOABS 0.9 01/17/2022 1635   EOSABS 0.1 03/01/2022 0933   BASOSABS 0.1 03/01/2022 0933    Lab Results  Component Value Date   HGBA1C 5.1 02/28/2022    Assessment & Plan:  1. Hypertensive heart disease with chronic combined systolic and diastolic congestive heart failure (HCC) EF of 45 to 50% from echo 09/2021 Euvolemic He is not taking his Entresto, spironolactone or Jardiance due to his preference and does not want to get back on them Keep appoint with cardiology - Basic Metabolic Panel  2. Iron deficiency anemia, unspecified iron deficiency anemia type Last hemoglobin was 10.1 three months ago Will repeat CBC today He has undergone a colonoscopy from 2021 with repeat recommended in 3 years-he is due in 10/2022 Continue ferrous sulfate - ferrous sulfate 325 (65 FE) MG tablet; Take 1 tablet (325 mg total) by mouth daily with breakfast.  Dispense: 90 tablet; Refill: 1 - CBC with  Differential/Platelet  3. Chronic obstructive pulmonary disease, unspecified COPD type (HCC) Stable with no flares Continue Symbicort and albuterol MDI  4. OSA (obstructive sleep apnea) Stable on CPAP   Health Care Maintenance: Due for repeat colonoscopy in 10/2022 due to multiple polyps and sessile polyp present. Meds ordered this encounter  Medications   ferrous sulfate 325 (65 FE) MG tablet    Sig: Take 1 tablet (325 mg total) by mouth daily with breakfast.    Dispense:  90 tablet    Refill:  1    Follow-up: Return in about 6 months (around 11/30/2022) for Chronic medical conditions.       12/02/2022, MD, FAAFP. [facility] [place], [place]   05/31/2022, 2:06 PM

## 2022-05-31 NOTE — Patient Instructions (Signed)

## 2022-06-01 ENCOUNTER — Other Ambulatory Visit: Payer: Self-pay

## 2022-06-01 ENCOUNTER — Other Ambulatory Visit: Payer: Self-pay | Admitting: Family Medicine

## 2022-06-01 DIAGNOSIS — D509 Iron deficiency anemia, unspecified: Secondary | ICD-10-CM

## 2022-06-01 LAB — CBC WITH DIFFERENTIAL/PLATELET
Basophils Absolute: 0.1 10*3/uL (ref 0.0–0.2)
Basos: 1 %
EOS (ABSOLUTE): 0.1 10*3/uL (ref 0.0–0.4)
Eos: 1 %
Hematocrit: 29.4 % — ABNORMAL LOW (ref 37.5–51.0)
Hemoglobin: 9.1 g/dL — ABNORMAL LOW (ref 13.0–17.7)
Immature Grans (Abs): 0 10*3/uL (ref 0.0–0.1)
Immature Granulocytes: 0 %
Lymphocytes Absolute: 1.3 10*3/uL (ref 0.7–3.1)
Lymphs: 15 %
MCH: 23.6 pg — ABNORMAL LOW (ref 26.6–33.0)
MCHC: 31 g/dL — ABNORMAL LOW (ref 31.5–35.7)
MCV: 76 fL — ABNORMAL LOW (ref 79–97)
Monocytes Absolute: 0.7 10*3/uL (ref 0.1–0.9)
Monocytes: 8 %
Neutrophils Absolute: 6.5 10*3/uL (ref 1.4–7.0)
Neutrophils: 75 %
Platelets: 413 10*3/uL (ref 150–450)
RBC: 3.85 x10E6/uL — ABNORMAL LOW (ref 4.14–5.80)
RDW: 14.2 % (ref 11.6–15.4)
WBC: 8.6 10*3/uL (ref 3.4–10.8)

## 2022-06-01 LAB — BASIC METABOLIC PANEL
BUN/Creatinine Ratio: 11 (ref 10–24)
BUN: 14 mg/dL (ref 8–27)
CO2: 28 mmol/L (ref 20–29)
Calcium: 9.6 mg/dL (ref 8.6–10.2)
Chloride: 97 mmol/L (ref 96–106)
Creatinine, Ser: 1.22 mg/dL (ref 0.76–1.27)
Glucose: 105 mg/dL — ABNORMAL HIGH (ref 70–99)
Potassium: 4.1 mmol/L (ref 3.5–5.2)
Sodium: 139 mmol/L (ref 134–144)
eGFR: 63 mL/min/{1.73_m2} (ref >59)

## 2022-07-13 ENCOUNTER — Other Ambulatory Visit: Payer: Self-pay

## 2022-07-16 ENCOUNTER — Other Ambulatory Visit: Payer: Self-pay

## 2022-07-24 ENCOUNTER — Other Ambulatory Visit: Payer: Self-pay | Admitting: Pharmacist

## 2022-07-24 ENCOUNTER — Other Ambulatory Visit: Payer: Self-pay

## 2022-07-24 MED ORDER — FLUTICASONE FUROATE-VILANTEROL 200-25 MCG/ACT IN AEPB
1.0000 | INHALATION_SPRAY | Freq: Every day | RESPIRATORY_TRACT | 6 refills | Status: AC
  Filled 2022-07-24: qty 60, 60d supply, fill #0

## 2022-07-31 ENCOUNTER — Other Ambulatory Visit: Payer: Self-pay

## 2022-08-01 ENCOUNTER — Other Ambulatory Visit: Payer: Self-pay

## 2022-08-01 ENCOUNTER — Emergency Department (HOSPITAL_COMMUNITY): Payer: Medicare Other

## 2022-08-01 ENCOUNTER — Inpatient Hospital Stay (HOSPITAL_COMMUNITY): Payer: Medicare Other

## 2022-08-01 ENCOUNTER — Encounter (HOSPITAL_COMMUNITY): Payer: Self-pay | Admitting: Cardiovascular Disease

## 2022-08-01 ENCOUNTER — Inpatient Hospital Stay (HOSPITAL_COMMUNITY)
Admission: EM | Admit: 2022-08-01 | Discharge: 2022-08-10 | DRG: 871 | Disposition: E | Payer: Medicare Other | Attending: Internal Medicine | Admitting: Internal Medicine

## 2022-08-01 ENCOUNTER — Encounter (HOSPITAL_COMMUNITY): Admission: EM | Disposition: E | Payer: Self-pay | Source: Home / Self Care | Attending: Internal Medicine

## 2022-08-01 DIAGNOSIS — J9602 Acute respiratory failure with hypercapnia: Secondary | ICD-10-CM

## 2022-08-01 DIAGNOSIS — I469 Cardiac arrest, cause unspecified: Secondary | ICD-10-CM

## 2022-08-01 DIAGNOSIS — J9601 Acute respiratory failure with hypoxia: Secondary | ICD-10-CM

## 2022-08-01 DIAGNOSIS — I420 Dilated cardiomyopathy: Secondary | ICD-10-CM | POA: Diagnosis not present

## 2022-08-01 DIAGNOSIS — J189 Pneumonia, unspecified organism: Secondary | ICD-10-CM | POA: Diagnosis present

## 2022-08-01 DIAGNOSIS — J439 Emphysema, unspecified: Secondary | ICD-10-CM | POA: Diagnosis not present

## 2022-08-01 DIAGNOSIS — R6521 Severe sepsis with septic shock: Secondary | ICD-10-CM | POA: Diagnosis present

## 2022-08-01 DIAGNOSIS — A419 Sepsis, unspecified organism: Secondary | ICD-10-CM | POA: Diagnosis not present

## 2022-08-01 DIAGNOSIS — Z4682 Encounter for fitting and adjustment of non-vascular catheter: Secondary | ICD-10-CM | POA: Diagnosis not present

## 2022-08-01 DIAGNOSIS — J690 Pneumonitis due to inhalation of food and vomit: Secondary | ICD-10-CM | POA: Diagnosis present

## 2022-08-01 DIAGNOSIS — R404 Transient alteration of awareness: Secondary | ICD-10-CM | POA: Diagnosis not present

## 2022-08-01 DIAGNOSIS — I462 Cardiac arrest due to underlying cardiac condition: Secondary | ICD-10-CM | POA: Diagnosis not present

## 2022-08-01 DIAGNOSIS — I252 Old myocardial infarction: Secondary | ICD-10-CM

## 2022-08-01 DIAGNOSIS — Z66 Do not resuscitate: Secondary | ICD-10-CM | POA: Diagnosis present

## 2022-08-01 DIAGNOSIS — J9690 Respiratory failure, unspecified, unspecified whether with hypoxia or hypercapnia: Secondary | ICD-10-CM | POA: Diagnosis not present

## 2022-08-01 DIAGNOSIS — J440 Chronic obstructive pulmonary disease with acute lower respiratory infection: Secondary | ICD-10-CM | POA: Diagnosis present

## 2022-08-01 DIAGNOSIS — E662 Morbid (severe) obesity with alveolar hypoventilation: Secondary | ICD-10-CM | POA: Diagnosis present

## 2022-08-01 DIAGNOSIS — E871 Hypo-osmolality and hyponatremia: Secondary | ICD-10-CM | POA: Diagnosis not present

## 2022-08-01 DIAGNOSIS — D62 Acute posthemorrhagic anemia: Secondary | ICD-10-CM | POA: Diagnosis present

## 2022-08-01 DIAGNOSIS — I4721 Torsades de pointes: Secondary | ICD-10-CM | POA: Diagnosis present

## 2022-08-01 DIAGNOSIS — R570 Cardiogenic shock: Secondary | ICD-10-CM | POA: Diagnosis present

## 2022-08-01 DIAGNOSIS — G931 Anoxic brain damage, not elsewhere classified: Secondary | ICD-10-CM | POA: Diagnosis present

## 2022-08-01 DIAGNOSIS — I214 Non-ST elevation (NSTEMI) myocardial infarction: Secondary | ICD-10-CM | POA: Diagnosis present

## 2022-08-01 DIAGNOSIS — I499 Cardiac arrhythmia, unspecified: Secondary | ICD-10-CM | POA: Diagnosis not present

## 2022-08-01 DIAGNOSIS — I272 Pulmonary hypertension, unspecified: Secondary | ICD-10-CM | POA: Diagnosis not present

## 2022-08-01 DIAGNOSIS — E785 Hyperlipidemia, unspecified: Secondary | ICD-10-CM | POA: Diagnosis present

## 2022-08-01 DIAGNOSIS — J449 Chronic obstructive pulmonary disease, unspecified: Secondary | ICD-10-CM | POA: Diagnosis present

## 2022-08-01 DIAGNOSIS — E1165 Type 2 diabetes mellitus with hyperglycemia: Secondary | ICD-10-CM | POA: Diagnosis present

## 2022-08-01 DIAGNOSIS — Z515 Encounter for palliative care: Secondary | ICD-10-CM | POA: Diagnosis not present

## 2022-08-01 DIAGNOSIS — K922 Gastrointestinal hemorrhage, unspecified: Secondary | ICD-10-CM | POA: Diagnosis present

## 2022-08-01 DIAGNOSIS — G9341 Metabolic encephalopathy: Secondary | ICD-10-CM | POA: Diagnosis not present

## 2022-08-01 DIAGNOSIS — Z6834 Body mass index (BMI) 34.0-34.9, adult: Secondary | ICD-10-CM

## 2022-08-01 DIAGNOSIS — R55 Syncope and collapse: Secondary | ICD-10-CM | POA: Diagnosis not present

## 2022-08-01 DIAGNOSIS — J9622 Acute and chronic respiratory failure with hypercapnia: Secondary | ICD-10-CM | POA: Diagnosis present

## 2022-08-01 DIAGNOSIS — I4901 Ventricular fibrillation: Secondary | ICD-10-CM | POA: Diagnosis present

## 2022-08-01 DIAGNOSIS — R4182 Altered mental status, unspecified: Secondary | ICD-10-CM | POA: Diagnosis not present

## 2022-08-01 DIAGNOSIS — I110 Hypertensive heart disease with heart failure: Secondary | ICD-10-CM | POA: Diagnosis not present

## 2022-08-01 DIAGNOSIS — N170 Acute kidney failure with tubular necrosis: Secondary | ICD-10-CM | POA: Diagnosis not present

## 2022-08-01 DIAGNOSIS — G4733 Obstructive sleep apnea (adult) (pediatric): Secondary | ICD-10-CM | POA: Diagnosis present

## 2022-08-01 DIAGNOSIS — I5042 Chronic combined systolic (congestive) and diastolic (congestive) heart failure: Secondary | ICD-10-CM | POA: Diagnosis not present

## 2022-08-01 DIAGNOSIS — I2510 Atherosclerotic heart disease of native coronary artery without angina pectoris: Secondary | ICD-10-CM | POA: Diagnosis present

## 2022-08-01 DIAGNOSIS — Z8249 Family history of ischemic heart disease and other diseases of the circulatory system: Secondary | ICD-10-CM

## 2022-08-01 DIAGNOSIS — Z743 Need for continuous supervision: Secondary | ICD-10-CM | POA: Diagnosis not present

## 2022-08-01 HISTORY — DX: Obstructive sleep apnea (adult) (pediatric): G47.33

## 2022-08-01 HISTORY — DX: Pulmonary hypertension, unspecified: I27.20

## 2022-08-01 HISTORY — DX: Chronic obstructive pulmonary disease, unspecified: J44.9

## 2022-08-01 HISTORY — DX: Essential (primary) hypertension: I10

## 2022-08-01 HISTORY — DX: Heart failure, unspecified: I50.9

## 2022-08-01 LAB — POCT I-STAT 7, (LYTES, BLD GAS, ICA,H+H)
Acid-base deficit: 2 mmol/L (ref 0.0–2.0)
Acid-base deficit: 4 mmol/L — ABNORMAL HIGH (ref 0.0–2.0)
Acid-base deficit: 7 mmol/L — ABNORMAL HIGH (ref 0.0–2.0)
Bicarbonate: 19.5 mmol/L — ABNORMAL LOW (ref 20.0–28.0)
Bicarbonate: 21.8 mmol/L (ref 20.0–28.0)
Bicarbonate: 25.4 mmol/L (ref 20.0–28.0)
Calcium, Ion: 1.03 mmol/L — ABNORMAL LOW (ref 1.15–1.40)
Calcium, Ion: 1.05 mmol/L — ABNORMAL LOW (ref 1.15–1.40)
Calcium, Ion: 1.06 mmol/L — ABNORMAL LOW (ref 1.15–1.40)
HCT: 25 % — ABNORMAL LOW (ref 39.0–52.0)
HCT: 31 % — ABNORMAL LOW (ref 39.0–52.0)
HCT: 32 % — ABNORMAL LOW (ref 39.0–52.0)
Hemoglobin: 10.5 g/dL — ABNORMAL LOW (ref 13.0–17.0)
Hemoglobin: 10.9 g/dL — ABNORMAL LOW (ref 13.0–17.0)
Hemoglobin: 8.5 g/dL — ABNORMAL LOW (ref 13.0–17.0)
O2 Saturation: 100 %
O2 Saturation: 93 %
O2 Saturation: 98 %
Patient temperature: 97.2
Patient temperature: 97.2
Potassium: 2.6 mmol/L — CL (ref 3.5–5.1)
Potassium: 3.4 mmol/L — ABNORMAL LOW (ref 3.5–5.1)
Potassium: 4.6 mmol/L (ref 3.5–5.1)
Sodium: 129 mmol/L — ABNORMAL LOW (ref 135–145)
Sodium: 130 mmol/L — ABNORMAL LOW (ref 135–145)
Sodium: 133 mmol/L — ABNORMAL LOW (ref 135–145)
TCO2: 21 mmol/L — ABNORMAL LOW (ref 22–32)
TCO2: 23 mmol/L (ref 22–32)
TCO2: 27 mmol/L (ref 22–32)
pCO2 arterial: 41.2 mmHg (ref 32–48)
pCO2 arterial: 42.8 mmHg (ref 32–48)
pCO2 arterial: 55.8 mmHg — ABNORMAL HIGH (ref 32–48)
pH, Arterial: 7.267 — ABNORMAL LOW (ref 7.35–7.45)
pH, Arterial: 7.28 — ABNORMAL LOW (ref 7.35–7.45)
pH, Arterial: 7.312 — ABNORMAL LOW (ref 7.35–7.45)
pO2, Arterial: 108 mmHg (ref 83–108)
pO2, Arterial: 524 mmHg — ABNORMAL HIGH (ref 83–108)
pO2, Arterial: 71 mmHg — ABNORMAL LOW (ref 83–108)

## 2022-08-01 LAB — COMPREHENSIVE METABOLIC PANEL
ALT: 40 U/L (ref 0–44)
AST: 107 U/L — ABNORMAL HIGH (ref 15–41)
Albumin: 3.1 g/dL — ABNORMAL LOW (ref 3.5–5.0)
Alkaline Phosphatase: 85 U/L (ref 38–126)
Anion gap: 25 — ABNORMAL HIGH (ref 5–15)
BUN: 17 mg/dL (ref 8–23)
CO2: 20 mmol/L — ABNORMAL LOW (ref 22–32)
Calcium: 8 mg/dL — ABNORMAL LOW (ref 8.9–10.3)
Chloride: 89 mmol/L — ABNORMAL LOW (ref 98–111)
Creatinine, Ser: 1.88 mg/dL — ABNORMAL HIGH (ref 0.61–1.24)
GFR, Estimated: 38 mL/min — ABNORMAL LOW (ref >60)
Glucose, Bld: 300 mg/dL — ABNORMAL HIGH (ref 70–99)
Potassium: 4.6 mmol/L (ref 3.5–5.1)
Sodium: 134 mmol/L — ABNORMAL LOW (ref 135–145)
Total Bilirubin: 0.6 mg/dL (ref 0.3–1.2)
Total Protein: 5.9 g/dL — ABNORMAL LOW (ref 6.5–8.1)

## 2022-08-01 LAB — PREPARE RBC (CROSSMATCH)

## 2022-08-01 LAB — BASIC METABOLIC PANEL
Anion gap: 20 — ABNORMAL HIGH (ref 5–15)
BUN: 26 mg/dL — ABNORMAL HIGH (ref 8–23)
CO2: 20 mmol/L — ABNORMAL LOW (ref 22–32)
Calcium: 8 mg/dL — ABNORMAL LOW (ref 8.9–10.3)
Chloride: 91 mmol/L — ABNORMAL LOW (ref 98–111)
Creatinine, Ser: 2.48 mg/dL — ABNORMAL HIGH (ref 0.61–1.24)
GFR, Estimated: 27 mL/min — ABNORMAL LOW (ref >60)
Glucose, Bld: 332 mg/dL — ABNORMAL HIGH (ref 70–99)
Potassium: 3 mmol/L — ABNORMAL LOW (ref 3.5–5.1)
Sodium: 131 mmol/L — ABNORMAL LOW (ref 135–145)

## 2022-08-01 LAB — I-STAT ARTERIAL BLOOD GAS, ED
Acid-base deficit: 3 mmol/L — ABNORMAL HIGH (ref 0.0–2.0)
Bicarbonate: 22 mmol/L (ref 20.0–28.0)
Calcium, Ion: 1.03 mmol/L — ABNORMAL LOW (ref 1.15–1.40)
HCT: 27 % — ABNORMAL LOW (ref 39.0–52.0)
Hemoglobin: 9.2 g/dL — ABNORMAL LOW (ref 13.0–17.0)
O2 Saturation: 100 %
Potassium: 3.7 mmol/L (ref 3.5–5.1)
Sodium: 131 mmol/L — ABNORMAL LOW (ref 135–145)
TCO2: 23 mmol/L (ref 22–32)
pCO2 arterial: 40.2 mmHg (ref 32–48)
pH, Arterial: 7.345 — ABNORMAL LOW (ref 7.35–7.45)
pO2, Arterial: 318 mmHg — ABNORMAL HIGH (ref 83–108)

## 2022-08-01 LAB — MAGNESIUM: Magnesium: 2.6 mg/dL — ABNORMAL HIGH (ref 1.7–2.4)

## 2022-08-01 LAB — CBC
HCT: 24.3 % — ABNORMAL LOW (ref 39.0–52.0)
HCT: 30.1 % — ABNORMAL LOW (ref 39.0–52.0)
Hemoglobin: 6.5 g/dL — CL (ref 13.0–17.0)
Hemoglobin: 8.6 g/dL — ABNORMAL LOW (ref 13.0–17.0)
MCH: 20.9 pg — ABNORMAL LOW (ref 26.0–34.0)
MCH: 21.6 pg — ABNORMAL LOW (ref 26.0–34.0)
MCHC: 26.7 g/dL — ABNORMAL LOW (ref 30.0–36.0)
MCHC: 28.6 g/dL — ABNORMAL LOW (ref 30.0–36.0)
MCV: 78.1 fL — ABNORMAL LOW (ref 80.0–100.0)
MCV: 75.6 fL — ABNORMAL LOW (ref 80.0–100.0)
Platelets: 272 10*3/uL (ref 150–400)
Platelets: 371 10*3/uL (ref 150–400)
RBC: 3.11 MIL/uL — ABNORMAL LOW (ref 4.22–5.81)
RBC: 3.98 MIL/uL — ABNORMAL LOW (ref 4.22–5.81)
RDW: 16.8 % — ABNORMAL HIGH (ref 11.5–15.5)
RDW: 17.2 % — ABNORMAL HIGH (ref 11.5–15.5)
WBC: 24.6 10*3/uL — ABNORMAL HIGH (ref 4.0–10.5)
WBC: 30.3 10*3/uL — ABNORMAL HIGH (ref 4.0–10.5)
nRBC: 3.1 % — ABNORMAL HIGH (ref 0.0–0.2)
nRBC: 2.7 % — ABNORMAL HIGH (ref 0.0–0.2)

## 2022-08-01 LAB — I-STAT CHEM 8, ED
BUN: 21 mg/dL (ref 8–23)
Calcium, Ion: 0.92 mmol/L — ABNORMAL LOW (ref 1.15–1.40)
Chloride: 93 mmol/L — ABNORMAL LOW (ref 98–111)
Creatinine, Ser: 1.7 mg/dL — ABNORMAL HIGH (ref 0.61–1.24)
Glucose, Bld: 291 mg/dL — ABNORMAL HIGH (ref 70–99)
HCT: 53 % — ABNORMAL HIGH (ref 39.0–52.0)
Hemoglobin: 18 g/dL — ABNORMAL HIGH (ref 13.0–17.0)
Potassium: 4.5 mmol/L (ref 3.5–5.1)
Sodium: 135 mmol/L (ref 135–145)
TCO2: 25 mmol/L (ref 22–32)

## 2022-08-01 LAB — TRIGLYCERIDES: Triglycerides: 155 mg/dL — ABNORMAL HIGH (ref <150)

## 2022-08-01 LAB — LACTIC ACID, PLASMA
Lactic Acid, Venous: >9 mmol/L (ref 0.5–1.9)
Lactic Acid, Venous: 8.7 mmol/L (ref 0.5–1.9)

## 2022-08-01 LAB — PROCALCITONIN: Procalcitonin: <0.1 ng/mL

## 2022-08-01 LAB — CBG MONITORING, ED
Glucose-Capillary: 485 mg/dL — ABNORMAL HIGH (ref 70–99)
Glucose-Capillary: 262 mg/dL — ABNORMAL HIGH (ref 70–99)
Glucose-Capillary: 274 mg/dL — ABNORMAL HIGH (ref 70–99)

## 2022-08-01 LAB — PHOSPHORUS: Phosphorus: 11.3 mg/dL — ABNORMAL HIGH (ref 2.5–4.6)

## 2022-08-01 LAB — BRAIN NATRIURETIC PEPTIDE: B Natriuretic Peptide: 180.5 pg/mL — ABNORMAL HIGH (ref 0.0–100.0)

## 2022-08-01 LAB — APTT: aPTT: 36 seconds (ref 24–36)

## 2022-08-01 LAB — HEMOGLOBIN A1C
Hgb A1c MFr Bld: 5.6 % (ref 4.8–5.6)
Mean Plasma Glucose: 114.02 mg/dL

## 2022-08-01 LAB — PROTIME-INR
INR: 1.2 (ref 0.8–1.2)
Prothrombin Time: 15.1 seconds (ref 11.4–15.2)

## 2022-08-01 LAB — TROPONIN I (HIGH SENSITIVITY): Troponin I (High Sensitivity): 442 ng/L (ref <18)

## 2022-08-01 LAB — ABO/RH: ABO/RH(D): B POS

## 2022-08-01 SURGERY — LEFT HEART CATH AND CORONARY ANGIOGRAPHY
Anesthesia: LOCAL

## 2022-08-01 MED ORDER — FENTANYL 2500MCG IN NS 250ML (10MCG/ML) PREMIX INFUSION
25.0000 ug/h | INTRAVENOUS | Status: DC
  Administered 2022-08-01: 25 ug/h via INTRAVENOUS
  Filled 2022-08-01: qty 250

## 2022-08-01 MED ORDER — ALBUTEROL SULFATE (2.5 MG/3ML) 0.083% IN NEBU: 10.0000 mg | INHALATION_SOLUTION | Freq: Once | RESPIRATORY_TRACT | Status: AC

## 2022-08-01 MED ORDER — STERILE WATER FOR INJECTION IV SOLN
INTRAVENOUS | Status: DC
  Filled 2022-08-01: qty 1000

## 2022-08-01 MED ORDER — IPRATROPIUM BROMIDE 0.02 % IN SOLN: 1.5000 mg | Freq: Once | RESPIRATORY_TRACT | Status: DC

## 2022-08-01 MED ORDER — NOREPINEPHRINE 4 MG/250ML-% IV SOLN
0.0000 ug/min | INTRAVENOUS | Status: DC
  Administered 2022-08-01: 20 ug/min via INTRAVENOUS
  Administered 2022-08-01 – 2022-08-02 (×5): 60 ug/min via INTRAVENOUS
  Filled 2022-08-01 (×3): qty 250
  Filled 2022-08-01: qty 500
  Filled 2022-08-01: qty 250
  Filled 2022-08-01: qty 500
  Filled 2022-08-01 (×2): qty 250

## 2022-08-01 MED ORDER — INSULIN ASPART 100 UNIT/ML IJ SOLN
0.0000 [IU] | INTRAMUSCULAR | Status: DC
  Administered 2022-08-01: 8 [IU] via SUBCUTANEOUS
  Administered 2022-08-01 – 2022-08-02 (×2): 11 [IU] via SUBCUTANEOUS

## 2022-08-01 MED ORDER — SODIUM CHLORIDE 0.9% IV SOLUTION: Freq: Once | INTRAVENOUS | Status: DC

## 2022-08-01 MED ORDER — FENTANYL CITRATE PF 50 MCG/ML IJ SOSY: 50.0000 ug | PREFILLED_SYRINGE | INTRAMUSCULAR | Status: DC | PRN

## 2022-08-01 MED ORDER — PHENYLEPHRINE HCL-NACL 20-0.9 MG/250ML-% IV SOLN
INTRAVENOUS | Status: AC
  Administered 2022-08-02: 20 mg
  Filled 2022-08-01: qty 250

## 2022-08-01 MED ORDER — FENTANYL 2500MCG IN NS 250ML (10MCG/ML) PREMIX INFUSION
25.0000 ug/h | INTRAVENOUS | Status: DC
  Administered 2022-08-01: 100 ug/h via INTRAVENOUS

## 2022-08-01 MED ORDER — AMIODARONE HCL IN DEXTROSE 360-4.14 MG/200ML-% IV SOLN
30.0000 mg/h | INTRAVENOUS | Status: DC
  Administered 2022-08-02: 30 mg/h via INTRAVENOUS
  Filled 2022-08-01 (×2): qty 200

## 2022-08-01 MED ORDER — LACTATED RINGERS IV SOLN: INTRAVENOUS | Status: DC

## 2022-08-01 MED ORDER — SODIUM BICARBONATE 8.4 % IV SOLN
INTRAVENOUS | Status: AC
  Administered 2022-08-01: 50 meq
  Filled 2022-08-01: qty 50

## 2022-08-01 MED ORDER — PHENYLEPHRINE 80 MCG/ML (10ML) SYRINGE FOR IV PUSH (FOR BLOOD PRESSURE SUPPORT)
160.0000 ug | PREFILLED_SYRINGE | Freq: Once | INTRAVENOUS | Status: AC | PRN
  Administered 2022-08-01: 160 ug via INTRAVENOUS

## 2022-08-01 MED ORDER — PHENYLEPHRINE HCL-NACL 20-0.9 MG/250ML-% IV SOLN
0.0000 ug/min | INTRAVENOUS | Status: DC
  Administered 2022-08-01: 100 ug/min via INTRAVENOUS
  Administered 2022-08-02: 400 ug/min via INTRAVENOUS
  Filled 2022-08-01 (×2): qty 250

## 2022-08-01 MED ORDER — FENTANYL BOLUS VIA INFUSION: 25.0000 ug | INTRAVENOUS | Status: DC | PRN

## 2022-08-01 MED ORDER — POLYETHYLENE GLYCOL 3350 17 G PO PACK: 17.0000 g | PACK | Freq: Every day | ORAL | Status: DC

## 2022-08-01 MED ORDER — ACETAMINOPHEN 325 MG PO TABS: 650.0000 mg | ORAL_TABLET | ORAL | Status: DC

## 2022-08-01 MED ORDER — AMIODARONE LOAD VIA INFUSION
150.0000 mg | Freq: Once | INTRAVENOUS | Status: AC
  Administered 2022-08-01: 150 mg via INTRAVENOUS
  Filled 2022-08-01: qty 83.34

## 2022-08-01 MED ORDER — BUSPIRONE HCL 10 MG PO TABS: 30.0000 mg | ORAL_TABLET | Freq: Three times a day (TID) | ORAL | Status: DC | PRN

## 2022-08-01 MED ORDER — ACETAMINOPHEN 325 MG PO TABS: 650.0000 mg | ORAL_TABLET | ORAL | Status: DC | PRN

## 2022-08-01 MED ORDER — VANCOMYCIN HCL 2000 MG/400ML IV SOLN
2000.0000 mg | Freq: Once | INTRAVENOUS | Status: DC
  Filled 2022-08-01: qty 400

## 2022-08-01 MED ORDER — EPINEPHRINE 0.1 MG/10ML (10 MCG/ML) SYRINGE FOR IV PUSH (FOR BLOOD PRESSURE SUPPORT): 50.0000 ug | PREFILLED_SYRINGE | Freq: Once | INTRAVENOUS | Status: DC | PRN

## 2022-08-01 MED ORDER — FENTANYL CITRATE PF 50 MCG/ML IJ SOSY: 100.0000 ug | PREFILLED_SYRINGE | INTRAMUSCULAR | Status: DC | PRN

## 2022-08-01 MED ORDER — SODIUM CHLORIDE 0.9 % IV SOLN
3.0000 g | Freq: Four times a day (QID) | INTRAVENOUS | Status: DC
  Administered 2022-08-02: 3 g via INTRAVENOUS
  Filled 2022-08-01 (×2): qty 8

## 2022-08-01 MED ORDER — ACETAMINOPHEN 650 MG RE SUPP
650.0000 mg | RECTAL | Status: DC
  Filled 2022-08-01: qty 1

## 2022-08-01 MED ORDER — FENTANYL CITRATE PF 50 MCG/ML IJ SOSY: 100.0000 ug | PREFILLED_SYRINGE | Freq: Once | INTRAMUSCULAR | Status: DC

## 2022-08-01 MED ORDER — SODIUM CHLORIDE 0.9 % IV SOLN
3.0000 g | Freq: Once | INTRAVENOUS | Status: AC
  Administered 2022-08-01: 3 g via INTRAVENOUS
  Filled 2022-08-01: qty 8

## 2022-08-01 MED ORDER — ALBUTEROL SULFATE (2.5 MG/3ML) 0.083% IN NEBU
INHALATION_SOLUTION | RESPIRATORY_TRACT | Status: AC
  Administered 2022-08-01: 10 mg via RESPIRATORY_TRACT
  Filled 2022-08-01: qty 12

## 2022-08-01 MED ORDER — ACETAMINOPHEN 160 MG/5ML PO SOLN: 650.0000 mg | ORAL | Status: DC | PRN

## 2022-08-01 MED ORDER — ROCURONIUM BROMIDE 50 MG/5ML IV SOLN
INTRAVENOUS | Status: DC | PRN
  Administered 2022-08-01: 100 mg via INTRAVENOUS

## 2022-08-01 MED ORDER — SODIUM CHLORIDE 0.9 % IV SOLN: 2.0000 g | Freq: Once | INTRAVENOUS | Status: DC

## 2022-08-01 MED ORDER — FENTANYL CITRATE (PF) 100 MCG/2ML IJ SOLN
INTRAMUSCULAR | Status: AC
  Filled 2022-08-01: qty 2

## 2022-08-01 MED ORDER — SODIUM CHLORIDE 0.9 % IV SOLN
250.0000 mL | INTRAVENOUS | Status: DC
  Administered 2022-08-02: 250 mL via INTRAVENOUS

## 2022-08-01 MED ORDER — IPRATROPIUM-ALBUTEROL 0.5-2.5 (3) MG/3ML IN SOLN
3.0000 mL | Freq: Four times a day (QID) | RESPIRATORY_TRACT | Status: DC
  Administered 2022-08-01 – 2022-08-02 (×3): 3 mL via RESPIRATORY_TRACT
  Filled 2022-08-01 (×3): qty 3

## 2022-08-01 MED ORDER — DOCUSATE SODIUM 50 MG/5ML PO LIQD: 100.0000 mg | Freq: Two times a day (BID) | ORAL | Status: DC | PRN

## 2022-08-01 MED ORDER — PROPOFOL 1000 MG/100ML IV EMUL
0.0000 ug/kg/min | INTRAVENOUS | Status: DC
  Administered 2022-08-01: 20 ug/kg/min via INTRAVENOUS
  Filled 2022-08-01: qty 100

## 2022-08-01 MED ORDER — FENTANYL CITRATE PF 50 MCG/ML IJ SOSY: 25.0000 ug | PREFILLED_SYRINGE | INTRAMUSCULAR | Status: DC | PRN

## 2022-08-01 MED ORDER — PHENYLEPHRINE HCL-NACL 20-0.9 MG/250ML-% IV SOLN: 0.0000 ug/min | INTRAVENOUS | Status: DC

## 2022-08-01 MED ORDER — MAGNESIUM SULFATE 2 GM/50ML IV SOLN
2.0000 g | Freq: Once | INTRAVENOUS | Status: AC
  Administered 2022-08-01: 2 g via INTRAVENOUS

## 2022-08-01 MED ORDER — MAGNESIUM SULFATE 2 GM/50ML IV SOLN: 2.0000 g | Freq: Once | INTRAVENOUS | Status: DC | PRN

## 2022-08-01 MED ORDER — STERILE WATER FOR INJECTION IV SOLN
INTRAVENOUS | Status: DC
  Filled 2022-08-01 (×2): qty 1000

## 2022-08-01 MED ORDER — DOCUSATE SODIUM 50 MG/5ML PO LIQD: 100.0000 mg | Freq: Two times a day (BID) | ORAL | Status: DC

## 2022-08-01 MED ORDER — SODIUM CHLORIDE 0.9 % IV SOLN: INTRAVENOUS | Status: DC | PRN

## 2022-08-01 MED ORDER — VASOPRESSIN 20 UNITS/100 ML INFUSION FOR SHOCK
0.0000 [IU]/min | INTRAVENOUS | Status: DC
  Administered 2022-08-01: 0.03 [IU]/min via INTRAVENOUS
  Administered 2022-08-02: 0.04 [IU]/min via INTRAVENOUS
  Filled 2022-08-01 (×2): qty 100

## 2022-08-01 MED ORDER — PANTOPRAZOLE SODIUM 40 MG IV SOLR: 40.0000 mg | Freq: Every day | INTRAVENOUS | Status: DC

## 2022-08-01 MED ORDER — ACETAMINOPHEN 160 MG/5ML PO SOLN: 650.0000 mg | ORAL | Status: DC

## 2022-08-01 MED ORDER — MAGNESIUM SULFATE 2 GM/50ML IV SOLN
2.0000 g | Freq: Once | INTRAVENOUS | Status: AC
  Administered 2022-08-01: 2 g via INTRAVENOUS
  Filled 2022-08-01: qty 50

## 2022-08-01 MED ORDER — CALCIUM GLUCONATE-NACL 1-0.675 GM/50ML-% IV SOLN: 1.0000 g | Freq: Once | INTRAVENOUS | Status: DC

## 2022-08-01 MED ORDER — PROPOFOL 1000 MG/100ML IV EMUL
0.0000 ug/kg/min | INTRAVENOUS | Status: DC
  Administered 2022-08-01: 5 ug/kg/min via INTRAVENOUS
  Filled 2022-08-01: qty 100

## 2022-08-01 MED ORDER — CALCIUM GLUCONATE-NACL 1-0.675 GM/50ML-% IV SOLN
1.0000 g | Freq: Once | INTRAVENOUS | Status: AC
  Administered 2022-08-01: 1000 mg via INTRAVENOUS
  Filled 2022-08-01: qty 50

## 2022-08-01 MED ORDER — ONDANSETRON HCL 4 MG/2ML IJ SOLN: 4.0000 mg | Freq: Four times a day (QID) | INTRAMUSCULAR | Status: DC | PRN

## 2022-08-01 MED ORDER — ALBUTEROL SULFATE (2.5 MG/3ML) 0.083% IN NEBU: 2.5000 mg | INHALATION_SOLUTION | RESPIRATORY_TRACT | Status: DC | PRN

## 2022-08-01 MED ORDER — MAGNESIUM SULFATE 2 GM/50ML IV SOLN: 2.0000 g | Freq: Once | INTRAVENOUS | Status: DC

## 2022-08-01 MED ORDER — ACETAMINOPHEN 650 MG RE SUPP: 650.0000 mg | RECTAL | Status: DC | PRN

## 2022-08-01 MED ORDER — LACTATED RINGERS IV BOLUS
1000.0000 mL | Freq: Once | INTRAVENOUS | Status: AC
  Administered 2022-08-01: 1000 mL via INTRAVENOUS

## 2022-08-01 MED ORDER — POLYETHYLENE GLYCOL 3350 17 G PO PACK: 17.0000 g | PACK | Freq: Every day | ORAL | Status: DC | PRN

## 2022-08-01 MED ORDER — IOHEXOL 350 MG/ML SOLN
100.0000 mL | Freq: Once | INTRAVENOUS | Status: AC | PRN
  Administered 2022-08-01: 100 mL via INTRAVENOUS

## 2022-08-01 MED ORDER — AMIODARONE HCL IN DEXTROSE 360-4.14 MG/200ML-% IV SOLN
60.0000 mg/h | INTRAVENOUS | Status: AC
  Administered 2022-08-01: 60 mg/h via INTRAVENOUS
  Filled 2022-08-01: qty 200

## 2022-08-01 MED ORDER — EPINEPHRINE HCL 5 MG/250ML IV SOLN IN NS
0.5000 ug/min | INTRAVENOUS | Status: DC
  Administered 2022-08-01: 20 ug/min via INTRAVENOUS
  Administered 2022-08-02: 11 ug/min via INTRAVENOUS
  Filled 2022-08-01 (×2): qty 250

## 2022-08-01 NOTE — ED Provider Notes (Signed)
I was called to the patient's room at 1910 for an episode of hypotension.  Patient found to be in torsades and required an additional shock at 200 J.  Shock delivered and patient returned to normal sinus rhythm.  2 g of magnesium ordered.  .Cardioversion  Date/Time: 08/01/2022 7:13 PM  Performed by: Teressa Lower, MD Authorized by: Teressa Lower, MD   Consent:    Consent obtained:  Verbal and emergent situation Pre-procedure details:    Cardioversion basis:  Elective   Pre-procedure rhythm: torsades.   Electrode placement:  Anterior-posterior Patient sedated: Yes. Refer to sedation procedure documentation for details of sedation.  Attempt one:    Cardioversion mode:  Synchronous   Shock (Joules):  200   Shock outcome:  Conversion to normal sinus rhythm Post-procedure details:    Patient status:  Awake   Patient tolerance of procedure:  Tolerated well, no immediate complications     Teressa Lower, MD 08/01/22 [phone]

## 2022-08-01 NOTE — H&P (Signed)
NAME:  Kristopher CARDELLO Sr., MRN:  [id number], DOB:  03/06/1951, LOS: 0 ADMISSION DATE:  2022-08-06, CONSULTATION DATE:  Aug 06, 2022 REFERRING MD:  Kommor CHIEF COMPLAINT:  Cardiac Arrest   History of Present Illness:  Kristopher PATTERSON Sr. is a 72 y.o. male who has PMH of PAH, OSA on CPAP (AHI 65 in 2020), systolic CHF (Echo from April 2023 with EF 45-50%, global hypokinesis), non obstructive CAD, DCM, HLD, HTN. 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 EMS was called and CPR was started at 1556. He was shocked several times for V.Fib.  In ED, he had loss of pulses again requiring CPR for 2 rounds before ROSC. He was intubated and post intubation he had significant hypotension requiring initiation of Levophed, Epinephrine, Vasopressin. Arterial line and central line have been placed by EDP.  Initial EKG was possible STEMI but concern was this was shortly after ROSC. Cardiology was consulted and will see pt shortly.  Hgb noteable for 6.5, WBC 24.6. CMP pending. Head CT and CTA chest/abd/pelv are pending.  Pertinent  Medical History:  does not have a problem list on file.  Significant Hospital Events: Including procedures, antibiotic start and stop dates in addition to other pertinent events   08-06-2022 admit.  Interim History / Subjective:  Sedated, starting to wake up on vent.  Objective:  Blood pressure (!) 66/49, pulse 80, resp. rate (!) 24, height (P) 5\' 8"  (1.727 m), SpO2 100 %.       No intake or output data in the 24 hours ending 2022/08/06 1704 There were no vitals filed for this visit.  Examination: General: Adult male, critically ill. Neuro: Sedated, not responsive. HEENT: [place]/AT. Sclerae anicteric. ETT in place. Cardiovascular: RRR, no M/R/G. R arterial art line in place, L femoral CVL in place. Lungs: Respirations even and unlabored.  CTA bilaterally, No W/R/R. Abdomen: Obese, BS x 4, soft, NT/ND.  Musculoskeletal: No gross  deformities, no edema.  Skin: Intact, warm, no rashes.   Labs/imaging personally reviewed:  CT head 08-06-22 >  CTA chest/abd/pelv 2022/08/06 >  Echo 08/06/2022 >   Assessment & Plan:    VF cardiac arrest - unclear etiology at this point. CMP pending. EKG with diffuse depressions and TWI. Cardiology has been consulted. Hx systolic CHF (Echo from April 2023 with EF 45-50%, global hypokinesis), non obstructive CAD, DCM, HLD, HTN. - Appreciate cardiology assistance. - Follow troponins. - Get echo. - Avoid fevers. - Check UDS. - F/u with heart failure clinic as outpatient. - Hold home ASA, Atorvastatin, Jardiance, Furosemide, Entresto, Aldactone.  Shock - concern mixed picture, possibly cardiac component but Hgb also 6.5 so concern some hemorrhagic source,  and WBC 24 presumed 2/2 aspiration. - Continue Levophed, Vasopressin, Epinephrine. Wean Epi and vaso first. - Goal MAP > 65. - 2u PRBC ordered. - Follow H/H. - Empiric Unasyn for now. - Check PCT for completeness.  - Check cortisol, TSH.  Acute hypoxic respiratory failure - 2/2 above. Hx OSA on CPAP, COPD. - Full vent support. - Weaning as mental status improves. - Bronchial hygiene. - Follow CXR. - CPAP nocturnally once extubated. - BD's.  AKI. Hypocalcemia. - Blood resuscitation then gentle fluilds. - 1g Ca gluconate. - Follow BMP.  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 - F/u on head CT and mental status recovery.  Hx DM. - SSI. - Hold home Jardiance.  Goals of care: Called daughter  [name] and provided an update. They are around the hospital and will be available for updates. For now, she states that pt has made it clear that he would NOT want prolonged intubation/life support measures. For now continue full code. We will revisit things in the next few days and determine how things are looking regarding his prognosis/recovery.  Best practice (evaluated daily):  Diet/type: NPO DVT prophylaxis:  not indicated GI prophylaxis: PPI Lines: Central line and Arterial Line Foley:  Yes, and it is still needed Code Status:  full code Last date of multidisciplinary goals of care discussion: None yet. Called and updated pt's daughter [name] over the phone  Labs   CBC: No results for input(s): "WBC", "NEUTROABS", "HGB", "HCT", "MCV", "PLT" in the last 168 hours.  Basic Metabolic Panel: No results for input(s): "NA", "K", "CL", "CO2", "GLUCOSE", "BUN", "CREATININE", "CALCIUM", "MG", "PHOS" in the last 168 hours. GFR: CrCl cannot be calculated (No successful lab value found.). No results for input(s): "PROCALCITON", "WBC", "LATICACIDVEN" in the last 168 hours.  Liver Function Tests: No results for input(s): "AST", "ALT", "ALKPHOS", "BILITOT", "PROT", "ALBUMIN" in the last 168 hours. No results for input(s): "LIPASE", "AMYLASE" in the last 168 hours. No results for input(s): "AMMONIA" in the last 168 hours.  ABG No results found for: "PHART", "PCO2ART", "PO2ART", "HCO3", "TCO2", "ACIDBASEDEF", "O2SAT"   Coagulation Profile: No results for input(s): "INR", "PROTIME" in the last 168 hours.  Cardiac Enzymes: No results for input(s): "CKTOTAL", "CKMB", "CKMBINDEX", "TROPONINI" in the last 168 hours.  HbA1C: No results found for: "HGBA1C"  CBG: Recent Labs  Lab 08/01/22 1616  GLUCAP 485*    Review of Systems:   Unable to obtain as pt is encephalopathic.  Past Medical History:  He,  has no past medical history on file.   Surgical History:  Not available.  Social History:      Family History:  His family history is not on file.   Allergies Not on File   Home Medications  Prior to Admission medications   Not on File     Critical care time: 45 min.   Rutherford Guys, PA - [facility] For pager details, please see AMION or use Epic chat  After 1900, please call [facility] for cross coverage needs 08/01/2022, 5:04 PM

## 2022-08-01 NOTE — ED Notes (Signed)
Call to the [facility] to request bed assignment to be priority due to acuity

## 2022-08-01 NOTE — Progress Notes (Signed)
PCCM interval progress note:  Pt seen with Dr. Lamonte Sakai for worsening hypotension despite high dose levophed, neosynephrine and vaso.  Repeat CXR ordered, not severely acidotic at 7.28.   Spoke with family and updated daughter that pt is worsening despite maximal support and may code again tonight, she voiced understanding  -CXR without pneumo, POCUS with poor windows but no pericardial effusion and EF does not appear significantly reduced -start bicarb gtt  -continue Unasyn and current pressors -full code, family understands poor prognosis, is hoping pt's son will make it in town tomorrow to see him.   Otilio Carpen Rosalita Carey, PA-C   2:56 AM Pt lost pulses, PEA arrest, ROSC with one round of ACLS, called daughter to let her know.  She is coming  to the bedside

## 2022-08-01 NOTE — ED Notes (Signed)
Stop bicarb and epi per MD

## 2022-08-01 NOTE — Progress Notes (Addendum)
eLink Physician-Brief Progress Note Patient Name: Kristopher SWATEK Sr. DOB: 17-Dec-1950 MRN: [id number]   Date of Service  08/01/2022  HPI/Events of Note  Patient admitted s/p out of hospital cardiac arrest with CPR and ROSC, she has had repeated Torsade requiring cardioversion, she is currently on an Amiodarone gtt, she is hypotensive on current gtt rates of Levo and Vaso.  eICU Interventions  Vasopressin gtt increased to 0.04, Levo gtt titrated up to 55 mcg, will start Phenylephrine gtt if further pressor requirement (as a Levo sparing agent given recurrent Torsade and frequent ectopy). New Patient Evaluation. Heparin gtt discontinued due to likelihood of active hemorrhage. Rapidly transfuse 2 units of PRBC and keep 2 units in reserve,        Kristopher [name] 08/01/2022, 9:53 PM

## 2022-08-01 NOTE — ED Notes (Signed)
Bicarb given

## 2022-08-01 NOTE — Progress Notes (Incomplete)
ANTICOAGULATION CONSULT NOTE - Initial Consult  Pharmacy Consult for IV heparin Indication: chest pain/ACS  Not on File  Patient Measurements: Height: 5' 8"$  (172.7 cm) Weight: 104.3 kg (230 lb) IBW/kg (Calculated) : 68.4 Heparin Dosing Weight: 91.1 kg  Vital Signs: Temp: 97.2 F (36.2 C) (02/21 1959) BP: 130/66 (02/21 1845) Pulse Rate: 82 (02/21 2000)  Labs: Recent Labs    08/01/22 1627 08/01/22 1725 08/01/22 1840  HGB 6.5* 18.0* 9.2*  HCT 24.3* 53.0* 27.0*  PLT 272  --   --   APTT 36  --   --   LABPROT 15.1  --   --   INR 1.2  --   --   CREATININE 1.88* 1.70*  --   TROPONINIHS 442*  --   --     Estimated Creatinine Clearance: 46.7 mL/min (A) (by C-G formula based on SCr of 1.7 mg/dL (H)).   Medical History: Past Medical History:  Diagnosis Date   CHF (congestive heart failure) (HCC)    COPD (chronic obstructive pulmonary disease) (HCC)    HTN (hypertension)    OSA (obstructive sleep apnea)    Pulmonary HTN (HCC)     Assessment: 71 YOM with ACS, s/p V-fib arrest. Does not appear to be on oral anticoagulant PTA on merged chart. Hgb 6.5 and PLT 272 on CBC, currently being transfused.   Goal of Therapy:  [id number] {Monitor platelets by anticoagulation protocol:[id number]::"Monitor platelets by anticoagulation protocol: Yes"}   Plan:  [id number]  Eliseo Gum, PharmD PGY1 Pharmacy Resident   08/01/2022  8:19 PM

## 2022-08-01 NOTE — ED Notes (Signed)
RN at bedside, Pt went into torsades, MD immediately notified, zoll charged and shock administered. Pt returned to sinus rhythm 80-85bpm.

## 2022-08-01 NOTE — Consult Note (Addendum)
Cardiology Consultation   Patient ID: Kristopher Mongiello Bann Sr. MRN: [id number]; DOB: [birth year]  Admit date: August 28, 2022 Date of Consult: 08-28-22  PCP:  Hoy Register, MD   [facility] Providers Cardiologist:  Armanda Magic, MD        Patient Profile:   Kristopher Leep Leaver Sr. is a 72 y.o. male with a hx of diabetes and nonischemic cardiomyopathy who is being seen Aug 28, 2022 for the evaluation of cardiac arrest at the request of Dr Posey Rea.  History of Present Illness:   Kristopher [name] who presents with OOH cardiac arrest. Apparently had witnessed arrest at work with immediate bystander CPR. On EMS arrival patient reportedly had shockable rhythm and received several shocks. He then had prolonged PEA requiring CPR/multiple rounds of epi. In the ED, required CPR again but quickly regained ROSC after losing pulses for a brief period of time. Now on multiple pressors with markedly abnormal EKG, cardiology consult obtained to see if he should be taken to the cath lab.   Remaining hx obtained through chart review. No history obtainable at this time as patient is comatose. Hx includes chronic systolic and diastolic CHF with normalized LVEF at most recent echo assessment (see below). He has chronic iron deficiency anemia, severe obstructive sleep apnea, and pulmonary HTN. He has been followed periodically in [facility] by Dr Gala Romney.    Past Medical History:  Diagnosis Date   CHF (congestive heart failure) (HCC)    COPD (chronic obstructive pulmonary disease) (HCC)    HTN (hypertension)    OSA (obstructive sleep apnea)    Pulmonary HTN (HCC)     History reviewed. No pertinent surgical history.   Home Medications:  Prior to Admission medications   Not on File    Inpatient Medications: Scheduled Meds:  sodium chloride   Intravenous Once   sodium chloride   Intravenous Once   albuterol  10 mg Nebulization Once   albuterol       fentaNYL (SUBLIMAZE) injection  100 mcg  Intravenous Once   ipratropium  1.5 mg Nebulization Once   ipratropium-albuterol  3 mL Nebulization Q6H   pantoprazole (PROTONIX) IV  40 mg Intravenous QHS   Continuous Infusions:  sodium chloride     ampicillin-sulbactam (UNASYN) IV     epinephrine 5 mcg/min (2022-08-28 1727)   fentaNYL infusion INTRAVENOUS 25 mcg/hr (2022-08-28 1717)   lactated ringers     magnesium sulfate bolus IVPB     norepinephrine (LEVOPHED) Adult infusion 20 mcg/min (August 28, 2022 1607)   propofol (DIPRIVAN) infusion 40 mcg/kg/min (2022-08-28 1720)   vasopressin 0.03 Units/min (08/28/2022 1632)   PRN Meds: Place/Maintain arterial line **AND** sodium chloride, albuterol, albuterol, docusate, EPINEPHrine, EPINEPHrine, fentaNYL, polyethylene glycol, rocuronium  Allergies:   Not on File  Social History:   Social History   Socioeconomic History   Marital status: Single    Spouse name: Not on file   Number of children: Not on file   Years of education: Not on file   Highest education level: Not on file  Occupational History   Not on file  Tobacco Use   Smoking status: Former    Years: 22.00    Types: Cigarettes   Smokeless tobacco: Never  Substance and Sexual Activity   Alcohol use: Not on file   Drug use: Not on file   Sexual activity: Not on file  Other Topics Concern   Not on file  Social History Narrative   Not on file   Social Determinants  of Health   Financial Resource Strain: Not on file  Food Insecurity: Not on file  Transportation Needs: Not on file  Physical Activity: Not on file  Stress: Not on file  Social Connections: Not on file  Intimate Partner Violence: Not on file    Family History:   Family History  Problem Relation Age of Onset   Hypertension Mother    Hypertension Sister      ROS:  Please see the history of present illness.  All other ROS reviewed and negative.     Physical Exam/Data:   Vitals:   08-03-22 1600 August 03, 2022 1603 08/03/2022 1620 03-Aug-2022 1731  BP: (!) 122/99  (!) 66/49    Pulse: 80     Resp: (!) 26 (!) 24    SpO2: 100%     Weight:    104.3 kg  Height:   5\' 8"  (1.727 m)    No intake or output data in the 24 hours ending 08-03-22 1740    08/03/2022    5:31 PM  Last 3 Weights  Weight (lbs) 230 lb  Weight (kg) 104.327 kg     Body mass index is 34.97 kg/m.  General:  Intubated, unresponsive HEENT: normal Neck: no JVD Vascular: No carotid bruits; Distal pulses 2+ bilaterally Cardiac:  normal S1, S2; RRR; no murmur  Lungs:  clear to auscultation bilaterally, no wheezing, rhonchi or rales  Abd: soft, no hepatomegaly  Ext: no edema Musculoskeletal:  No deformities, unable to assess strength Skin: warm and dry  Neuro:  Comatose, unresponsive Psych:  Unable to assess  EKG:  The EKG was personally reviewed and demonstrates:  Accelerated idioventricular rhythm, marked ST abnormality consider diffuse subendocardial ischemia  Repeat EKG 17:31 shows NSR, nonspecific IVCD, PVC's, no significant ST elevation  Relevant CV Studies: Cardiac Cath 08/04/2018: Prox LAD lesion is 30% stenosed. Prox RCA to Mid RCA lesion is 30% stenosed. Prox Cx lesion is 30% stenosed.   Findings:   Ao = 107/69 (87)  LV =  119/13 RA =  5 RV = 64/11 PA = 71/23 (39) PCW = 21 Fick cardiac output/index = 5.6/2.6 PVR = 3.1 WU Ao sat = 94% PA sat = 64%, 65%   Assessment: 1. Minimal non-obstructive CAD 2. NICM EF 30-35% 3. Moderate pulmonary HTN due primarily to OSA/OHS 4. Relative well-compensated volume status  Echo 09/12/2021: 1. Left ventricular ejection fraction, by estimation, is 45 to 50%. The  left ventricle has mildly decreased function. The left ventricular  demonstrates global hypokinesis. There is mild-to-moderate concentric left  ventricular hypertrophy. Indeterminate  diastolic filling due to E-A fusion. There is septal-lateral dyssynchrony  due to LBBB.   2. Right ventricular systolic function is normal. The right ventricular  size is normal.  Tricuspid regurgitation signal is inadequate for assessing  PA pressure.   3. Left atrial size was moderately dilated.   4. Right atrial size was mildly dilated.   5. The mitral valve is grossly normal. Mild mitral valve regurgitation.   6. The aortic valve is tricuspid. There is mild calcification of the  aortic valve. There is mild thickening of the aortic valve. Aortic valve  regurgitation is trivial. Aortic valve sclerosis/calcification is present,  without any evidence of aortic  stenosis.   7. The inferior vena cava is dilated in size with <50% respiratory  variability, suggesting right atrial pressure of 15 mmHg.   Comparison(s): Compared to prior TTE in 02/2021, the LVEF is now 45-50%  (previously reported as 70-75%).  Laboratory Data:  High Sensitivity Troponin:  No results for input(s): "TROPONINIHS" in the last 720 hours.   Chemistry Recent Labs  Lab 08/01/22 1725  NA 135  K 4.5  CL 93*  GLUCOSE 291*  BUN 21  CREATININE 1.70*    No results for input(s): "PROT", "ALBUMIN", "AST", "ALT", "ALKPHOS", "BILITOT" in the last 168 hours. Lipids  Recent Labs  Lab 08/01/22 1627  TRIG 155*    Hematology Recent Labs  Lab 08/01/22 1627 08/01/22 1725  WBC 24.6*  --   RBC 3.11*  --   HGB 6.5* 18.0*  HCT 24.3* 53.0*  MCV 78.1*  --   MCH 20.9*  --   MCHC 26.7*  --   RDW 16.8*  --   PLT 272  --    Thyroid No results for input(s): "TSH", "FREET4" in the last 168 hours.  BNPNo results for input(s): "BNP", "PROBNP" in the last 168 hours.  DDimer No results for input(s): "DDIMER" in the last 168 hours.   Radiology/Studies:  CT Angio Chest/Abd/Pel for Dissection W and/or Wo Contrast  Result Date: 08/01/2022 CLINICAL DATA:  Acute aortic syndrome suspected. Cardiac arrest post CPR. EXAM: CT ANGIOGRAPHY CHEST, ABDOMEN AND PELVIS TECHNIQUE: Non-contrast CT of the chest was initially obtained. Multidetector CT imaging through the chest, abdomen and pelvis was performed  using the standard protocol during bolus administration of intravenous contrast. Multiplanar reconstructed images and MIPs were obtained and reviewed to evaluate the vascular anatomy. RADIATION DOSE REDUCTION: This exam was performed according to the departmental dose-optimization program which includes automated exposure control, adjustment of the mA and/or kV according to patient size and/or use of iterative reconstruction technique. CONTRAST:  OMNIPAQUE IOHEXOL 350 MG/ML SOLN COMPARISON:  CT chest 08/12/2016 FINDINGS: CTA CHEST FINDINGS Cardiovascular: Noncontrast CT images of the chest demonstrate scattered aortic calcification. No evidence of intramural hematoma. Images obtained during the arterial phase after administration of intravenous contrast material demonstrates normal caliber thoracic aorta. No evidence of aneurysm or dissection. Motion artifact in the ascending aorta. Normal heart size. No pericardial effusions. Central pulmonary arteries are patent without evidence of central pulmonary embolus. Mediastinum/Nodes: Endotracheal and enteric tubes are present. Endotracheal tube tip is in the origin of the right mainstem bronchus and should be retracted. Enteric tube tip is in the body of the stomach. Thyroid gland is unremarkable. Esophagus is decompressed around the tube. No significant lymphadenopathy. Lungs/Pleura: Motion artifact limits evaluation. Emphysematous changes in the lungs. Diffuse mosaic attenuation pattern likely due to emphysema but could indicate edema or air trapping. Patchy infiltrates are suggested in the lung bases which could represent atelectasis, aspiration, or pneumonia. Musculoskeletal: Degenerative changes in the spine. Multiple acute appearing anterior rib fractures bilaterally, some demonstrating displacement. This is consistent with history of CPR. Review of the MIP images confirms the above findings. CTA ABDOMEN AND PELVIS FINDINGS VASCULAR Aorta: Scattered aortic  calcification. Abdominal aorta is patent. No aneurysm or dissection. Celiac: Patent without evidence of aneurysm, dissection, vasculitis or significant stenosis. SMA: Patent without evidence of aneurysm, dissection, vasculitis or significant stenosis. Renals: Both renal arteries are patent without evidence of aneurysm, dissection, vasculitis, fibromuscular dysplasia or significant stenosis. IMA: The inferior mesenteric artery is minimally opacified and may be stenosis at its origin. Inflow: Patent without evidence of aneurysm, dissection, vasculitis or significant stenosis. Veins: There is a left femoral venous catheter present with tip in the left common iliac vein. Small amount of subcutaneous infiltration and gas in the left groin is consistent with insertion  changes. Review of the MIP images confirms the above findings. NON-VASCULAR Hepatobiliary: No focal liver abnormality is seen. No gallstones, gallbladder wall thickening, or biliary dilatation. Pancreas: Unremarkable. No pancreatic ductal dilatation or surrounding inflammatory changes. Spleen: Normal in size without focal abnormality. Adrenals/Urinary Tract: Adrenal glands are unremarkable. Kidneys are normal, without renal calculi, focal lesion, or hydronephrosis. Bladder is unremarkable. Stomach/Bowel: Stomach, small bowel, and colon are not abnormally distended. No wall thickening or inflammatory changes are demonstrated. Appendix is normal. Lymphatic: No significant lymphadenopathy. Reproductive: Prostate gland is enlarged. The scrotum is incompletely included within the field of view. There is a large low-attenuation structure demonstrated in the right scrotum. This could represent a large hydrocele or enlarged right testicle. Consider scrotal ultrasound when appropriate to the patient's condition. Other: No free air or free fluid in the abdomen. Moderate periumbilical hernia containing fat. Musculoskeletal: Degenerative changes in the spine. Review of  the MIP images confirms the above findings. IMPRESSION: 1. No evidence of aneurysm or dissection of the thoracic or abdominal aorta. Mild aortic atherosclerosis. 2. The tip of the endotracheal tube is in the right mainstem bronchus origin and should be retracted. 3. Diffuse mosaic attenuation pattern to the lungs may indicate edema or air trapping. Patchy infiltrates in the lung bases could indicate multifocal pneumonia, aspiration, or atelectasis. 4. The inferior mesenteric artery demonstrates limited enhancement and could be stenosed at its origin. 5. Prostate gland is enlarged. 6. Enlarged low-attenuation structure in the right scrotum is incompletely visualized and could represent a hydrocele or enlarged right testicle. Consider scrotal ultrasound when appropriate to the patient's condition. 7. Moderate periumbilical hernia containing fat. 8. Multiple bilateral anterior rib fractures, some with displacement. Electronically Signed   By: Burman Nieves M.D.   On: 08/01/2022 17:24   CT HEAD WO CONTRAST  Result Date: 08/01/2022 CLINICAL DATA:  Mental status change of unknown cause. Cardiac arrest. Post CPR. EXAM: CT HEAD WITHOUT CONTRAST TECHNIQUE: Contiguous axial images were obtained from the base of the skull through the vertex without intravenous contrast. RADIATION DOSE REDUCTION: This exam was performed according to the departmental dose-optimization program which includes automated exposure control, adjustment of the mA and/or kV according to patient size and/or use of iterative reconstruction technique. COMPARISON:  None Available. FINDINGS: Brain: No evidence of acute infarction, hemorrhage, hydrocephalus, extra-axial collection or mass lesion/mass effect. Vascular: Intracranial arterial calcifications. Skull: Calvarium appears intact. Sinuses/Orbits: Mild mucosal thickening in the paranasal sinuses. No acute air-fluid levels. Right mastoid effusions. Other: OG and endotracheal tubes are present,  incompletely visualized. IMPRESSION: No acute intracranial abnormalities. Electronically Signed   By: Burman Nieves M.D.   On: 08/01/2022 17:12   DG Abd Portable 1V  Result Date: 08/01/2022 CLINICAL DATA:  NG tube placement EXAM: PORTABLE ABDOMEN - 1 VIEW COMPARISON:  None Available. FINDINGS: OG tube with tip in the stomach. Side port at the GE junction. Normal bowel-gas pattern. IMPRESSION: OG tube in stomach.  Side port at GE junction. Electronically Signed   By: Genevive Bi M.D.   On: 08/01/2022 16:59   DG Chest Port 1 View  Result Date: 08/01/2022 CLINICAL DATA:  OG tube placement after CPR EXAM: PORTABLE CHEST 1 VIEW COMPARISON:  05/03/2021 FINDINGS: An enteric tube has been placed. Tip projects over the left upper quadrant consistent with location in the body of the stomach. An endotracheal tube has been placed with tip measuring 6.1 cm above the carina. Shallow inspiration. Heart size and pulmonary vascularity are normal for technique. Lungs appear clear  and expanded. No pleural effusions. No pneumothorax. Visualized bones appear intact. IMPRESSION: Appliances appear in satisfactory location. No evidence of active pulmonary disease. Electronically Signed   By: Burman Nieves M.D.   On: 08/01/2022 16:53     Assessment and Plan:   Out of hospital cardiac arrest - VF/PEA Cardiogenic shock, background of nonischemic cardiomyopathy Anoxic brain injury Anemia, acute on chronic iron deficiency with HgB 6.5  Pt not a candidate for emergency cath or PCI with prolonged CPR, severe anemia, and no clear evidence of STEMI. He has a hx of 2 prior heart catheterizations in 2018 and 2020, both demonstrating mild nonobstructive CAD with no significant coronary lesions. Initial EKG markedly abnormal in the early post-arrest setting. Follow-up EKG shows nonspecific IVCD with no ST elevation. The patient has undergone emergent CTA of the chest/abd/pelvis and CT of the brain. Will follow-up on lab  work and imaging study results, most of which is currently pending. Will check a 2D echo. Pt currently requiring vasopressor support with epi, norepi, and vasopressin drips. Avoid IV heparin initially as source of anemia is currently unclear. Prognosis is extremely guarded. Cardiology team will follow with you.   The patient is critically ill with multiple organ systems failure and requires high complexity decision making for assessment and support, frequent evaluation and titration of therapies, application of advanced monitoring technologies and extensive interpretation of multiple databases.   Critical Care Time devoted to patient care services described in this note is 40 minutes.    Risk Assessment/Risk Scores:       For questions or updates, please contact [facility] Please consult [email or web address] for contact info under    Signed, Tonny Bollman, MD  08/01/2022 5:40 PM

## 2022-08-01 NOTE — Progress Notes (Addendum)
eLink Physician-Brief Progress Note Patient Name: Kristopher RUNKLES Sr. DOB: 15-Sep-1950 MRN: [id number]   Date of Service  08/01/2022  HPI/Events of Note  Patient with recurrent Torsade in the emergency department, he was admitted for V-fib arrest s/p CPR with ROSC, acute respiratory failure on the ventilator, NSTEMI, AKI, and aspiration pneumonia.  Dr. Tacy Learn (admitting physician) requested I obtain a cardiology consultation because patient is having recurrent Torsade, he is currently on Amiodarone gtt. Patient is not accessible via camera.  eICU Interventions  Cardiology consulted, Dr. Clayborn Bigness is on his way to the ED to see the patient.        Kristopher [name] 08/01/2022, 8:29 PM

## 2022-08-01 NOTE — ED Notes (Signed)
1606- CPR started  1606- EPI given  1607- pulse check  1613- EPI given

## 2022-08-01 NOTE — ED Notes (Addendum)
na

## 2022-08-01 NOTE — ED Notes (Signed)
ED is out of 87F foleys. Charge notified.

## 2022-08-01 NOTE — Progress Notes (Signed)
   08/01/22 1600  Spiritual Encounters  Type of Visit Initial  Care provided to: Family  Referral source Trauma page  Reason for visit Trauma  OnCall Visit No   Ch responded to code trauma. Pt's pastor and family present. No follow-up needed at this time.

## 2022-08-01 NOTE — ED Provider Notes (Signed)
[facility] Provider Note  CSN: [id number] Arrival date & time: 08/01/22 1555  Chief Complaint(s) Cardiac Arrest  HPI Kristopher H Springsteen Sr. is a 72 y.o. male with PMH AD, chronic diastolic CHF, COPD, chronic hypoxic hypercarbic respiratory failure on BiPAP nightly, OSA compliant with BiPAP, pulmonary hypertension who presents emergency department for evaluation of cardiac arrest.  History is limited by EMS states that he was going to pick his grandchild up from school when he suffered a syncopal episode and went to cardiac arrest.  Bystander CPR started immediately at 1458.  Initial rhythm by EMS is V-fib and patient received 3 total shocks, 4 epi is given prior to arrival, 300 of amiodarone.  On arrival, patient actively receiving compressions via [place] device with 7-0 tube in place.   Past Medical History No past medical history on file. There are no problems to display for this patient.  Home Medication(s) Prior to Admission medications   Not on File                                                                                                                                    Past Surgical History  Family History No family history on file.  Social History   Allergies Patient has no allergy information on record.  Review of Systems Review of Systems  Unable to perform ROS: Patient unresponsive    Physical Exam Vital Signs  I have reviewed the triage vital signs BP (!) 66/49   Pulse 80   Resp (!) 24   Ht (P) 5' 8"$  (1.727 m)   SpO2 100%   Physical Exam Vitals and nursing note reviewed.  Constitutional:      General: He is in acute distress.     Appearance: He is well-developed. He is toxic-appearing and diaphoretic.  HENT:     Head: Normocephalic and atraumatic.  Cardiovascular:     Heart sounds: No murmur heard. Pulmonary:     Effort: Respiratory distress present.  Abdominal:     Palpations: Abdomen is soft.      Tenderness: There is no abdominal tenderness.  Musculoskeletal:        General: No swelling.     Cervical back: Neck supple.  Skin:    Capillary Refill: Capillary refill takes more than 3 seconds.     Coloration: Skin is pale.  Neurological:     Mental Status: He is alert.     ED Results and Treatments Labs (all labs ordered are listed, but only abnormal results are displayed) Labs Reviewed  CBG MONITORING, ED - Abnormal; Notable for the following components:      Result Value   Glucose-Capillary 485 (*)    All other components within normal limits  CULTURE, RESPIRATORY W GRAM STAIN  BLOOD GAS, ARTERIAL  COMPREHENSIVE METABOLIC PANEL  LACTIC ACID, PLASMA  LACTIC ACID, PLASMA  LACTIC ACID, PLASMA  LACTIC  ACID, PLASMA  CBC  APTT  PROTIME-INR  MAGNESIUM  PHOSPHORUS  RAPID URINE DRUG SCREEN, HOSP PERFORMED  PROCALCITONIN  TRIGLYCERIDES  URINALYSIS, W/ REFLEX TO CULTURE (INFECTION SUSPECTED)  I-STAT CHEM 8, ED  TYPE AND SCREEN  TROPONIN I (HIGH SENSITIVITY)                                                                                                                          Radiology DG Chest Port 1 View  Result Date: 08/01/2022 CLINICAL DATA:  OG tube placement after CPR EXAM: PORTABLE CHEST 1 VIEW COMPARISON:  05/03/2021 FINDINGS: An enteric tube has been placed. Tip projects over the left upper quadrant consistent with location in the body of the stomach. An endotracheal tube has been placed with tip measuring 6.1 cm above the carina. Shallow inspiration. Heart size and pulmonary vascularity are normal for technique. Lungs appear clear and expanded. No pleural effusions. No pneumothorax. Visualized bones appear intact. IMPRESSION: Appliances appear in satisfactory location. No evidence of active pulmonary disease. Electronically Signed   By: Lucienne Capers M.D.   On: 08/01/2022 16:53    Pertinent labs & imaging results that were available during my care of the patient  were reviewed by me and considered in my medical decision making (see MDM for details).  Medications Ordered in ED Medications  norepinephrine (LEVOPHED) 98m in 2550m(0.016 mg/mL) premix infusion (20 mcg/min Intravenous New Bag/Given 08/01/22 1607)  0.9 %  sodium chloride infusion (has no administration in time range)  sodium bicarbonate 150 mEq in sterile water 1,150 mL infusion ( Intravenous New Bag/Given 08/01/22 1644)  EPINEPHrine (ADRENALIN) 5 mg in NS 250 mL (0.02 mg/mL) premix infusion (20 mcg/min Intravenous New Bag/Given 08/01/22 1632)  EPINEPhrine 10 mcg/mL Adult IV Push Syringe (For Blood Pressure Support) (has no administration in time range)  EPINEPhrine 10 mcg/mL Adult IV Push Syringe (For Blood Pressure Support) (has no administration in time range)  vasopressin (PITRESSIN) 20 Units in sodium chloride 0.9 % 100 mL infusion-*FOR SHOCK* (0.03 Units/min Intravenous New Bag/Given 08/01/22 1632)  fentaNYL (SUBLIMAZE) injection 50 mcg (has no administration in time range)  fentaNYL (SUBLIMAZE) injection 25-100 mcg (has no administration in time range)  PHENYLephrine 80 mcg/ml in normal saline Adult IV Push Syringe (For Blood Pressure Support) (160 mcg Intravenous Given 08/01/22 1622)  lactated ringers bolus 1,000 mL (1,000 mLs Intravenous New Bag/Given 08/01/22 1611)  Procedures .Critical Care  Performed by: Teressa Lower, MD Authorized by: Teressa Lower, MD   Critical care provider statement:    Critical care time (minutes):  104   Critical care was necessary to treat or prevent imminent or life-threatening deterioration of the following conditions:  Cardiac failure, respiratory failure and CNS failure or compromise   Critical care was time spent personally by me on the following activities:  Development of treatment plan with patient or surrogate,  discussions with consultants, evaluation of patient's response to treatment, examination of patient, ordering and review of laboratory studies, ordering and review of radiographic studies, ordering and performing treatments and interventions, pulse oximetry, re-evaluation of patient's condition and review of old charts .Cardioversion  Date/Time: 08/01/2022 5:23 PM  Performed by: Teressa Lower, MD Authorized by: Teressa Lower, MD   Consent:    Consent obtained:  Emergent situation Pre-procedure details:    Cardioversion basis:  Emergent   Rhythm:  Ventricular tachycardia   Electrode placement:  Anterior-posterior Patient sedated: Yes. Refer to sedation procedure documentation for details of sedation.  Attempt one:    Cardioversion mode:  Synchronous   Shock (Joules):  200   Shock outcome:  Conversion to normal sinus rhythm Post-procedure details:    Patient status:  Unresponsive   Patient tolerance of procedure:  Tolerated well, no immediate complications .Central Line  Date/Time: 08/01/2022 5:23 PM  Performed by: Teressa Lower, MD Authorized by: Teressa Lower, MD   Consent:    Consent obtained:  Emergent situation Pre-procedure details:    Indication(s): central venous access and insufficient peripheral access     Skin preparation:  Chlorhexidine with alcohol Sedation:    Sedation type:  None Anesthesia:    Anesthesia method:  None Procedure details:    Location:  L femoral   Procedural supplies:  Triple lumen   Ultrasound guidance: yes     Ultrasound guidance timing: real time     Number of attempts:  1   Successful placement: yes   Post-procedure details:    Post-procedure:  Line sutured and dressing applied   Assessment:  Blood return through all ports   Procedure completion:  Tolerated well, no immediate complications ARTERIAL LINE  Date/Time: 08/01/2022 5:23 PM  Performed by: Teressa Lower, MD Authorized by: Teressa Lower, MD   Consent:    Consent  obtained:  Emergent situation Pre-procedure details:    Skin preparation:  Chlorhexidine Sedation:    Sedation type:  None Anesthesia:    Anesthesia method:  None Procedure details:    Location:  R radial   Placement technique:  Seldinger and ultrasound guided   Number of attempts:  2 Post-procedure details:    Post-procedure:  Biopatch applied   Procedure completion:  Tolerated well, no immediate complications   (including critical care time)  Medical Decision Making / ED Course   This patient presents to the ED for concern of cardiac arrest, this involves an extensive number of treatment options, and is a complaint that carries with it a high risk of complications and morbidity.  The differential diagnosis includes cardiac arrest, CVA, electrolyte abnormality, tamponade, aortic dissection, STEMI, hypoxia  MDM: Patient seen emergency room for evaluation of cardiac arrest.  Physical exam reveals a toxic appearing patient receiving active compressions via [place] device and intubated.  Abdomen distended and wheezes heard.  CPR began immediately here in the emergency department and after first pulse check, using ultrasound, patient did achieve ROSC.  Position of the endotracheal tube checked by direct  visualization.  Patient became hypotensive immediately following this and required increasing pressor requirements with vaso, levo, epi and pushes of phenylephrine.  Fluid resuscitation begun as well.  Patient had a sustained episode of ventricular tachycardia and received a synchronized cardioversion with return of normal sinus rhythm.  ECG with a junctional rhythm and a possible STEMI given lateral depressions and aVR elevation.  However, difficult to tell if this was the inciting event given the patient has had significant prolonged downtime and on multiple pressors as a source of his ischemia.  I spoke with Dr. Burt Knack of cardiology came to evaluate the patient at bedside and provide further  recommendations but for now we are not headed to the Cath Lab.  Broad-spectrum antibiotics initiated and initial CBC with a significant leukocytosis and anemia with hemoglobin 6.5.  2 units packed red blood cells ordered.  CT head, CT dissection study ordered and results are pending.  While in CT, patient was breath stacking and did require me to disconnect the ventilator and physically decompress his chest.  Albuterol and Atrovent ordered as well as magnesium.  Spoke with the ICU team who will admit the patient to the ICU.   Additional history obtained: -Additional history obtained from multiple family members -External records from outside source obtained and reviewed including: Chart review including previous notes, labs, imaging, consultation notes   Lab Tests: -I ordered, reviewed, and interpreted labs.   The pertinent results include:   Labs Reviewed  CBG MONITORING, ED - Abnormal; Notable for the following components:      Result Value   Glucose-Capillary 485 (*)    All other components within normal limits  CULTURE, RESPIRATORY W GRAM STAIN  BLOOD GAS, ARTERIAL  COMPREHENSIVE METABOLIC PANEL  LACTIC ACID, PLASMA  LACTIC ACID, PLASMA  LACTIC ACID, PLASMA  LACTIC ACID, PLASMA  CBC  APTT  PROTIME-INR  MAGNESIUM  PHOSPHORUS  RAPID URINE DRUG SCREEN, HOSP PERFORMED  PROCALCITONIN  TRIGLYCERIDES  URINALYSIS, W/ REFLEX TO CULTURE (INFECTION SUSPECTED)  I-STAT CHEM 8, ED  TYPE AND SCREEN  TROPONIN I (HIGH SENSITIVITY)      EKG   EKG Interpretation  Date/Time:  Wednesday August 01 2022 16:28:39 EST Ventricular Rate:  90 PR Interval:  110 QRS Duration: 115 QT Interval:  472 QTC Calculation: 578 R Axis:   -86 Text Interpretation: Junctional rhythm Lateral ST deppressions with elevation in aVR **possible STEMI** Confirmed by Williamson ([id number]) on 08/01/2022 4:40:29 PM         Imaging Studies ordered: I ordered imaging studies including chest x-ray, abdomen  x-ray I independently visualized and interpreted imaging. I agree with the radiologist interpretation  CT head and dissection study ordered and results are pending   Medicines ordered and prescription drug management: Meds ordered this encounter  Medications   norepinephrine (LEVOPHED) 88m in 2553m(0.016 mg/mL) premix infusion   0.9 %  sodium chloride infusion   sodium bicarbonate 150 mEq in sterile water 1,150 mL infusion    Sodium Bicarbonate 150 mEq added to 1L SWFI = 261 mOsm/L.   EPINEPHrine (ADRENALIN) 5 mg in NS 250 mL (0.02 mg/mL) premix infusion   EPINEPhrine 10 mcg/mL Adult IV Push Syringe (For Blood Pressure Support)   EPINEPhrine 10 mcg/mL Adult IV Push Syringe (For Blood Pressure Support)   vasopressin (PITRESSIN) 20 Units in sodium chloride 0.9 % 100 mL infusion-*FOR SHOCK*   PHENYLephrine 80 mcg/ml in normal saline Adult IV Push Syringe (For Blood Pressure Support)   lactated ringers bolus  1,000 mL   fentaNYL (SUBLIMAZE) injection 50 mcg   fentaNYL (SUBLIMAZE) injection 25-100 mcg    -I have reviewed the patients home medicines and have made adjustments as needed  Critical interventions Multiple pressors, arterial line, central line, CPR  Consultations Obtained: I requested consultation with the cardiologist on-call Dr. Burt Knack and ICU team,  and discussed lab and imaging findings as well as pertinent plan - they recommend: Medical ICU admission and further resuscitation   Cardiac Monitoring: The patient was maintained on a cardiac monitor.  I personally viewed and interpreted the cardiac monitored which showed an underlying rhythm of: Junctional rhythm, tachycardia, ventricular tachycardia  Social Determinants of Health:  Factors impacting patients care include: none   Reevaluation: After the interventions noted above, I reevaluated the patient and found that they have :improved  Co morbidities that complicate the patient evaluation No past medical history  on file.    Dispostion: I considered admission for this patient, and due to cardiac arrest patient will require hospital admission     Final Clinical Impression(s) / ED Diagnoses Final diagnoses:  None     @PCDICTATION$ @    Teressa Lower, MD 08/01/22 1729

## 2022-08-01 NOTE — Progress Notes (Signed)
Pharmacy Antibiotic Note  Kristopher Wambach Robards Sr. is a 72 y.o. male for which pharmacy has been consulted for ampicillin-sulbactam dosing for aspiration pneumonia.  Patient presenting in cardiac arrest. Discussed pcn allergy with CCM -- remains ok for unasyn per CCM.  CrCl ~46.7 ml/min WBC 24.6; LA >9; HR 85; RR 28  Plan: Unasyn 3g q6h Trend WBC, Fever, Renal function F/u cultures, clinical course, WBC De-escalate when able  Height: (P) 5' 8"$  (172.7 cm) IBW/kg (Calculated) : (P) 68.4  No data recorded.  Recent Labs  Lab 08/01/22 1627  WBC 24.6*    CrCl cannot be calculated (No successful lab value found.).    Not on File  Microbiology results: Pending  Thank you for allowing pharmacy to be a part of this patient's care.  Lorelei Pont, PharmD, BCPS 08/01/2022 5:26 PM ED Clinical Pharmacist -  [phone]

## 2022-08-01 NOTE — Procedures (Signed)
Intubation Procedure Note  GABERIAL BOIS Sr.  [id number]  1951/05/05  Date:08/01/22  Time:7:46 PM   Provider Performing:[facility]    Procedure: Intubation (31500)  Indication(s) Respiratory Failure  Consent Unable to obtain consent due to emergent nature of procedure.   Anesthesia Fentanyl, Rocuronium, and Propofol   Time Out Verified patient identification, verified procedure, site/side was marked, verified correct patient position, special equipment/implants available, medications/allergies/relevant history reviewed, required imaging and test results available.   Sterile Technique Usual hand hygeine, masks, and gloves were used   Procedure Description Patient positioned in bed supine.  Sedation given as noted above.  Patient was intubated with endotracheal tube using  MAC 4 .  View was Grade 1 full glottis .  Number of attempts was 1.  Colorimetric CO2 detector was consistent with tracheal placement.   Complications/Tolerance None; patient tolerated the procedure well. Chest X-ray is ordered to verify placement.   EBL Minimal   Specimen(s) None

## 2022-08-01 NOTE — ED Triage Notes (Signed)
Pt was picking grandson up from school. Bystanders states pts eyes looked funny and he went out. CPR started immediately. CPR started at 1458. EMS shocked pt 3 times, pt was in vfib. 4 epis given. 300 amio.Pt has hx of CHF.

## 2022-08-01 NOTE — ED Notes (Signed)
CCM at bedside reintubating pt

## 2022-08-01 NOTE — ED Notes (Signed)
1556- Pulse check- no pulse  EPI given- 1557 1557- Bicarb given  1558- Pulse check- ROSC achieved

## 2022-08-01 NOTE — Progress Notes (Signed)
Patient is unstable at this time. EEG will be placed later.

## 2022-08-02 ENCOUNTER — Inpatient Hospital Stay (HOSPITAL_COMMUNITY): Payer: Medicare Other

## 2022-08-02 DIAGNOSIS — R4182 Altered mental status, unspecified: Secondary | ICD-10-CM

## 2022-08-02 DIAGNOSIS — I469 Cardiac arrest, cause unspecified: Secondary | ICD-10-CM | POA: Diagnosis not present

## 2022-08-02 LAB — BPAM RBC: Blood Product Expiration Date: 202403092359

## 2022-08-02 LAB — GLUCOSE, CAPILLARY
Glucose-Capillary: 307 mg/dL — ABNORMAL HIGH (ref 70–99)
Glucose-Capillary: 335 mg/dL — ABNORMAL HIGH (ref 70–99)

## 2022-08-02 LAB — BASIC METABOLIC PANEL
Anion gap: 18 — ABNORMAL HIGH (ref 5–15)
BUN: 29 mg/dL — ABNORMAL HIGH (ref 8–23)
CO2: 21 mmol/L — ABNORMAL LOW (ref 22–32)
Calcium: 7.6 mg/dL — ABNORMAL LOW (ref 8.9–10.3)
Chloride: 90 mmol/L — ABNORMAL LOW (ref 98–111)
Creatinine, Ser: 2.91 mg/dL — ABNORMAL HIGH (ref 0.61–1.24)
GFR, Estimated: 22 mL/min — ABNORMAL LOW (ref >60)
Glucose, Bld: 299 mg/dL — ABNORMAL HIGH (ref 70–99)
Potassium: 2.3 mmol/L — CL (ref 3.5–5.1)
Sodium: 129 mmol/L — ABNORMAL LOW (ref 135–145)

## 2022-08-02 LAB — POCT I-STAT 7, (LYTES, BLD GAS, ICA,H+H)
Acid-base deficit: 3 mmol/L — ABNORMAL HIGH (ref 0.0–2.0)
Acid-base deficit: 3 mmol/L — ABNORMAL HIGH (ref 0.0–2.0)
Bicarbonate: 23.5 mmol/L (ref 20.0–28.0)
Bicarbonate: 22.9 mmol/L (ref 20.0–28.0)
Calcium, Ion: 1.07 mmol/L — ABNORMAL LOW (ref 1.15–1.40)
Calcium, Ion: 1.03 mmol/L — ABNORMAL LOW (ref 1.15–1.40)
HCT: 34 % — ABNORMAL LOW (ref 39.0–52.0)
HCT: 34 % — ABNORMAL LOW (ref 39.0–52.0)
Hemoglobin: 11.6 g/dL — ABNORMAL LOW (ref 13.0–17.0)
Hemoglobin: 11.6 g/dL — ABNORMAL LOW (ref 13.0–17.0)
O2 Saturation: 100 %
O2 Saturation: 99 %
Patient temperature: 33.6
Patient temperature: 34.3
Potassium: 2.7 mmol/L — CL (ref 3.5–5.1)
Potassium: 2.3 mmol/L — CL (ref 3.5–5.1)
Sodium: 130 mmol/L — ABNORMAL LOW (ref 135–145)
Sodium: 129 mmol/L — ABNORMAL LOW (ref 135–145)
TCO2: 25 mmol/L (ref 22–32)
TCO2: 24 mmol/L (ref 22–32)
pCO2 arterial: 41.7 mmHg (ref 32–48)
pCO2 arterial: 38.4 mmHg (ref 32–48)
pH, Arterial: 7.341 — ABNORMAL LOW (ref 7.35–7.45)
pH, Arterial: 7.371 (ref 7.35–7.45)
pO2, Arterial: 185 mmHg — ABNORMAL HIGH (ref 83–108)
pO2, Arterial: 150 mmHg — ABNORMAL HIGH (ref 83–108)

## 2022-08-02 LAB — CBC
HCT: 31.5 % — ABNORMAL LOW (ref 39.0–52.0)
Hemoglobin: 9.7 g/dL — ABNORMAL LOW (ref 13.0–17.0)
MCH: 22.9 pg — ABNORMAL LOW (ref 26.0–34.0)
MCHC: 30.8 g/dL (ref 30.0–36.0)
MCV: 74.5 fL — ABNORMAL LOW (ref 80.0–100.0)
Platelets: 300 10*3/uL (ref 150–400)
RBC: 4.23 MIL/uL (ref 4.22–5.81)
RDW: 16.9 % — ABNORMAL HIGH (ref 11.5–15.5)
WBC: 21.2 10*3/uL — ABNORMAL HIGH (ref 4.0–10.5)
nRBC: 3.8 % — ABNORMAL HIGH (ref 0.0–0.2)

## 2022-08-02 LAB — MAGNESIUM
Magnesium: 3.3 mg/dL — ABNORMAL HIGH (ref 1.7–2.4)
Magnesium: 2.6 mg/dL — ABNORMAL HIGH (ref 1.7–2.4)

## 2022-08-02 LAB — TRIGLYCERIDES: Triglycerides: 80 mg/dL (ref <150)

## 2022-08-02 LAB — LACTIC ACID, PLASMA: Lactic Acid, Venous: 7.7 mmol/L (ref 0.5–1.9)

## 2022-08-02 LAB — PROCALCITONIN: Procalcitonin: 126.64 ng/mL

## 2022-08-02 LAB — HEMOGLOBIN AND HEMATOCRIT, BLOOD
HCT: 32 % — ABNORMAL LOW (ref 39.0–52.0)
Hemoglobin: 9.8 g/dL — ABNORMAL LOW (ref 13.0–17.0)

## 2022-08-02 LAB — CORTISOL: Cortisol, Plasma: 6.3 ug/dL

## 2022-08-02 LAB — PHOSPHORUS: Phosphorus: <1 mg/dL — CL (ref 2.5–4.6)

## 2022-08-02 LAB — TROPONIN I (HIGH SENSITIVITY): Troponin I (High Sensitivity): >24000 ng/L (ref <18)

## 2022-08-02 LAB — TSH: TSH: 1.446 u[IU]/mL (ref 0.350–4.500)

## 2022-08-02 MED ORDER — FENTANYL 2500MCG IN NS 250ML (10MCG/ML) PREMIX INFUSION: 0.0000 ug/h | INTRAVENOUS | Status: DC

## 2022-08-02 MED ORDER — ACETAMINOPHEN 650 MG RE SUPP: 650.0000 mg | Freq: Four times a day (QID) | RECTAL | Status: DC | PRN

## 2022-08-02 MED ORDER — SODIUM CHLORIDE 0.9 % IV SOLN: INTRAVENOUS | Status: DC

## 2022-08-02 MED ORDER — PANTOPRAZOLE SODIUM 40 MG IV SOLR
40.0000 mg | Freq: Two times a day (BID) | INTRAVENOUS | Status: DC
  Administered 2022-08-02: 40 mg via INTRAVENOUS
  Filled 2022-08-02: qty 10

## 2022-08-02 MED ORDER — POTASSIUM PHOSPHATES 15 MMOLE/5ML IV SOLN
45.0000 mmol | Freq: Once | INTRAVENOUS | Status: AC
  Administered 2022-08-02: 45 mmol via INTRAVENOUS
  Filled 2022-08-02: qty 15

## 2022-08-02 MED ORDER — FENTANYL BOLUS VIA INFUSION: 100.0000 ug | INTRAVENOUS | Status: DC | PRN

## 2022-08-02 MED ORDER — GLYCOPYRROLATE 0.2 MG/ML IJ SOLN: 0.2000 mg | INTRAMUSCULAR | Status: DC | PRN

## 2022-08-02 MED ORDER — GLYCOPYRROLATE 1 MG PO TABS
1.0000 mg | ORAL_TABLET | ORAL | Status: DC | PRN
  Filled 2022-08-02: qty 1

## 2022-08-02 MED ORDER — POTASSIUM CHLORIDE 10 MEQ/50ML IV SOLN
10.0000 meq | INTRAVENOUS | Status: DC
  Administered 2022-08-02: 10 meq via INTRAVENOUS
  Filled 2022-08-02: qty 50

## 2022-08-02 MED ORDER — POLYVINYL ALCOHOL 1.4 % OP SOLN: 1.0000 [drp] | Freq: Four times a day (QID) | OPHTHALMIC | Status: DC | PRN

## 2022-08-02 MED ORDER — MAGNESIUM SULFATE 2 GM/50ML IV SOLN: 2.0000 g | Freq: Once | INTRAVENOUS | Status: DC

## 2022-08-02 MED ORDER — LIDOCAINE IN D5W 4-5 MG/ML-% IV SOLN
1.0000 mg/min | INTRAVENOUS | Status: DC
  Administered 2022-08-02: 1 mg/min via INTRAVENOUS

## 2022-08-02 MED ORDER — NOREPINEPHRINE 16 MG/250ML-% IV SOLN
0.0000 ug/min | INTRAVENOUS | Status: DC
  Administered 2022-08-02: 55 ug/min via INTRAVENOUS
  Administered 2022-08-02: 10 ug/min via INTRAVENOUS
  Filled 2022-08-02 (×2): qty 250

## 2022-08-02 MED ORDER — NOREPINEPHRINE 4 MG/250ML-% IV SOLN
0.0000 ug/min | INTRAVENOUS | Status: DC
  Administered 2022-08-02: 28 ug/min via INTRAVENOUS
  Administered 2022-08-02: 30 ug/min via INTRAVENOUS
  Administered 2022-08-02: 18 ug/min via INTRAVENOUS
  Filled 2022-08-02 (×3): qty 250

## 2022-08-02 MED ORDER — ACETAMINOPHEN 325 MG PO TABS: 650.0000 mg | ORAL_TABLET | Freq: Four times a day (QID) | ORAL | Status: DC | PRN

## 2022-08-02 MED ORDER — SODIUM BICARBONATE 8.4 % IV SOLN
50.0000 meq | Freq: Once | INTRAVENOUS | Status: AC
  Administered 2022-08-02: 50 meq via INTRAVENOUS
  Filled 2022-08-02: qty 50

## 2022-08-02 MED ORDER — MIDAZOLAM HCL 2 MG/2ML IJ SOLN: 2.0000 mg | INTRAMUSCULAR | Status: DC | PRN

## 2022-08-02 MED ORDER — POTASSIUM CHLORIDE 10 MEQ/50ML IV SOLN
10.0000 meq | INTRAVENOUS | Status: AC
  Administered 2022-08-02 (×3): 10 meq via INTRAVENOUS
  Filled 2022-08-02 (×3): qty 50

## 2022-08-03 ENCOUNTER — Encounter: Payer: Self-pay | Admitting: Family Medicine

## 2022-08-03 LAB — TYPE AND SCREEN
ABO/RH(D): B POS
Antibody Screen: NEGATIVE
Unit division: 0
Unit division: 0
Unit division: 0
Unit division: 0
Unit division: 0

## 2022-08-03 LAB — BPAM RBC
Blood Product Expiration Date: 202402262359
Blood Product Expiration Date: 202403062359
Blood Product Expiration Date: 202403092359
Blood Product Expiration Date: 202403092359
Blood Product Expiration Date: 202403092359
ISSUE DATE / TIME: 202402211942
ISSUE DATE / TIME: 202402212220
Unit Type and Rh: 7300
Unit Type and Rh: 7300
Unit Type and Rh: 7300
Unit Type and Rh: 7300
Unit Type and Rh: 7300

## 2022-08-03 LAB — PATHOLOGIST SMEAR REVIEW

## 2022-08-10 NOTE — Code Documentation (Signed)
Patient Name: Kristopher Atherton Hoar Sr.   MRN: [id number]   Date of Birth/ Sex: [birth year] , male      Admission Date: 08/01/2022  Attending Provider: Cheri Fowler, MD  Primary Diagnosis: Cardiac arrest [facility])   Indication: Pt was in his usual state of health until this AM, when he was noted to be unresponsive. Code blue was subsequently called. At the time of arrival on scene, ACLS protocol was underway.   Technical Description:  - CPR performance duration:  8 minute  - Was defibrillation or cardioversion used? Yes   - Was external pacer placed? Yes  - Was patient intubated pre/post CPR? Yes   Medications Administered: Y = Yes; Blank = No Amiodarone  Y  Atropine    Calcium    Epinephrine  Y  Lidocaine  Y  Magnesium    Norepinephrine  Y  Phenylephrine    Sodium bicarbonate  Y  Vasopressin  Y   Post CPR evaluation:  - Final Status - Was patient successfully resuscitated ? Yes - What is current rhythm? Sinus tachycardia - What is current hemodynamic status? Stable but unresponsive  Miscellaneous Information:  - Labs sent, including: ABG, BMP, CBC  - Primary team notified?  Yes  - Family Notified? Yes  - Additional notes/ transfer status: Patient remains in ICU. Patient is high risk for further cardiac arrest. Dr. Delton Coombes, PCCM, arrived at bedside     Rana Snare, DO  08/25/22, 2:54 AM

## 2022-08-10 NOTE — Progress Notes (Signed)
eLink Physician-Brief Progress Note Patient Name: Kristopher GALLER Sr. DOB: [birth year] MRN: [id number]   Date of Service  2022-08-17  HPI/Events of Note  Patient with persistent hypotension of unclear etiology.  eICU Interventions  Bicarb 50 meq iv push x 1, Epinephrine gtt started, will check stat H & H to exclude persistent acute blood loss anemia from ongoing bleeding, K+ repleted, prognosis is grim.        Frederik Pear August 17, 2022, 12:25 AM

## 2022-08-10 NOTE — Plan of Care (Signed)
  Interdisciplinary Goals of Care Family Meeting   Date carried out:: 08-05-2022  Location of the meeting: Bedside  Member's involved: Bedside Registered Nurse, Family Member or next of kin, and Other: PA  [place] or acting medical decision maker: Daughter Facilities manager    Discussion: We discussed goals of care for Kristopher Auto  Sr. .    Pt coded again overnight and is on maximal support, discussed that this is not a reversible process and pt will pass. Kristopher [name] stated her father would not want to continue prolonged life support without chance of recovery and would like to change code status to DNR.  Once more family is able to be here will likely transition to comfort care  Code status: Full DNR  Disposition: Continue current acute care   Time spent for the meeting: 10 minutes  Kristopher [name] Kristopher [name] August 05, 2022, 3:39 AM

## 2022-08-10 NOTE — Progress Notes (Signed)
eLink Physician-Brief Progress Note Patient Name: Kristopher MASTIN Sr. DOB: 02-Aug-1950 MRN: [id number]   Date of Service  08-22-2022  HPI/Events of Note  Patient went into V-Tach then cardiac arrested, CPR started before I arrived in the room, ACLS protocol implemented with ROSC after a few minutes, Lidocaine gtt ordered, stat ABG ordered to assess acidosis, bedside RN asked to have cardiology (who are following the patient) to come and reassess patient at bedside, PCCM ground crew arrived and took over.  eICU Interventions  See above.        Frederik Pear Aug 22, 2022, 2:58 AM

## 2022-08-10 NOTE — Progress Notes (Signed)
Patient seen and examined. History reviewed. Events of last night noted.  Presented with out of hospital arrest with prolonged resusciation  Persistent severe shock despite maximal pressor support. Recurrent PEA arrest and [place] during the night  Now a DNR. Plan transition to comfort care once family present  Agree that these events are not survivable.   [name] MD, [facility]

## 2022-08-10 NOTE — Progress Notes (Signed)
eLink Physician-Brief Progress Note Patient Name: NIKAI SCHREYER Sr. DOB: February 15, 1951 MRN: [id number]   Date of Service  08-16-22  HPI/Events of Note  K+ 2.3, Phosphorus < 1.0.  eICU Interventions  Prior 40 meq KCL runs discontinued and K+ PO4 45 mmol run substituted.        Kerry Kass Vonita Calloway August 16, 2022, 6:30 AM

## 2022-08-10 NOTE — Progress Notes (Signed)
No escalation, family arriving through day, they understand he may not survive until everyone says goodbye.  When they are ready we will stop drips and allow natural passing.  Erskine Emery MD PCCM

## 2022-08-10 NOTE — Procedures (Signed)
Patient Name: Kristopher Soisson Varden Sr.  MRN: [id number]  Epilepsy Attending: Lora Havens  Referring Physician/Provider: Jacky Kindle, MD  Date: 08/01/2022 Duration: 22.03 mins  Patient history: 72yo M s/p cardiac arrest. EEG to evaluate for seizure  Level of alertness: comatose  AEDs during EEG study: Propofol  Technical aspects: This EEG study was done with scalp electrodes positioned according to the 10-20 International system of electrode placement. Electrical activity was reviewed with band pass filter of 1-70Hz$ , sensitivity of 7 uV/mm, display speed of 17m/sec with a 60Hz$  notched filter applied as appropriate. EEG data were recorded continuously and digitally stored.  Video monitoring was available and reviewed as appropriate.  Description: EEG showed continuous generalized background suppression, not reactive to stimulation.  Hyperventilation and photic stimulation were not performed.     EKG artifact was seen during this study.  ABNORMALITY -Background suppression, generalized  IMPRESSION: This study is suggestive of profound diffuse encephalopathy, nonspecific etiology but  given history of cardiac arrest is likely related to anoxic/hypoxic brain injury. No seizures or epileptiform discharges were seen throughout the recording.  [name]

## 2022-08-10 NOTE — Progress Notes (Signed)
0242: Pt identified to be in PEA, a-line flat, CPR initiated. 2 rounds of CPR, 1 epi to ROSC at 0247, then pt went into monomorphic VT at 0248, immediately defibx1 followed by 1 round CPR, ROSC w/ST rhythm at 0250.  Dr. Lamonte Sakai at bedside. Family notified by PA Gleason. Per CCM, will do 1 more round ACLS if pt codes again. Awaiting family to arrive to bedside at this time.

## 2022-08-10 NOTE — Procedures (Signed)
Extubation Procedure Note  Patient Details:   Name: Kristopher LATHEM Sr. DOB: [birth year] MRN: [id number]   Airway Documentation:    Vent end date: 08-25-22 Vent end time: 1420   Evaluation  O2 sats: currently acceptable Complications: No apparent complications Patient did tolerate procedure well. Bilateral Breath Sounds: Diminished, Rhonchi   No, pt extubated to comfort care per order.   Catha Brow August 25, 2022, 2:28 PM

## 2022-08-10 NOTE — Progress Notes (Signed)
NAME:  Kristopher MUKES Sr., MRN:  [id number], DOB:  [birth year], LOS: 1 ADMISSION DATE:  08/01/2022, CONSULTATION DATE:  08/01/22 REFERRING MD:  Kommor CHIEF COMPLAINT:  Cardiac Arrest   History of Present Illness:  Kristopher LENTON Sr. is a 72 y.o. male who has PMH of PAH, OSA on CPAP (AHI 65 in 2020), systolic CHF (Echo from April 2023 with EF 45-50%, global hypokinesis), non obstructive CAD, DCM, HLD, HTN. 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 EMS was called and CPR was started at 1556. He was shocked several times for V.Fib.  In ED, he had loss of pulses again requiring CPR for 2 rounds before ROSC. He was intubated and post intubation he had significant hypotension requiring initiation of Levophed, Epinephrine, Vasopressin. Arterial line and central line have been placed by EDP.  Initial EKG was possible STEMI but concern was this was shortly after ROSC. Cardiology was consulted and will see pt shortly.  Hgb noteable for 6.5, WBC 24.6. CMP pending. Head CT and CTA chest/abd/pelv are pending.  Pertinent  Medical History:  has Cardiac arrest [facility]) on their problem list.  Significant Hospital Events: Including procedures, antibiotic start and stop dates in addition to other pertinent events   2/21 admit.  Interim History / Subjective:  Significant deterioration overnight. On maximum support with marginal pressures. Family coming in.  Objective:  Blood pressure 110/72, pulse 86, temperature (!) 95.4 F (35.2 C), resp. rate (!) 28, height 5\' 8"  (1.727 m), weight 103 kg, SpO2 98 %.    Vent Mode: PRVC FiO2 (%):  [50 %-100 %] 100 % Set Rate:  [28 bmp] 28 bmp Vt Set:  [540 mL] 540 mL PEEP:  [5 cmH20] 5 cmH20 Plateau Pressure:  [21 cmH20-24 cmH20] 24 cmH20   Intake/Output Summary (Last 24 hours) at 08/11/2022 0753 Last data filed at 2022/08/11 0700 Gross per 24 hour  Intake 4198.52 ml  Output --  Net 4198.52 ml   Filed Weights    08/01/22 1731 08/01/22 2123 August 11, 2022 0458  Weight: 104.3 kg 103 kg 103 kg    Examination: Obtunded GCS3 Pupils fixed dilated Ext cool Lungs without wheezing, passive on vent  K/Phos being repleted ABG looks okay CBC improved WBC  Labs/imaging personally reviewed:  CT head, CT C/A/P  Assessment & Plan:  OHCA Vfib question ACS, too unstable for intervention plus had anemia on presentation Post arrest profound shock with multiorgan failure Post arrest coma Post arrest acute kidney injury Anemia improved  - Continue vent support, multiple pressors - Replete K/Phos - Sedation PRN - Await family arrival, current clinical situation non-survivable, DNR  Best practice (evaluated daily):  Diet/type: NPO DVT prophylaxis: not indicated GI prophylaxis: PPI Lines: Central line and Arterial Line Foley:  Yes, and it is still needed Code Status:  full code Last date of multidisciplinary goals of care discussion: None yet. Called and updated pt's daughter pending  31 min cc time Myrla Halsted MD PCCM

## 2022-08-10 NOTE — Progress Notes (Signed)
Attempted Echocardiogram, cancelled by the patient's nurse Ovid Curd due to comfort care.

## 2022-08-10 NOTE — Progress Notes (Signed)
EEG complete - results pending 

## 2022-08-10 NOTE — Death Summary Note (Signed)
DEATH SUMMARY   Patient Details  Name: Kristopher Christe Amos Sr. MRN: [id number] DOB: 08-27-1950  Admission/Discharge Information   Admit Date:  2022/08/08  Date of Death: Date of Death: 2022-08-09  Time of Death: Time of Death: 81  Length of Stay: 1  Referring Physician: Charlott Rakes, MD   Reason(s) for Hospitalization  Cardiac arrest  Diagnoses  Preliminary cause of death: ACS Secondary Diagnoses (including complications and co-morbidities):  Principal Problem:   Cardiac arrest [facility]) Active Problems:   Acute upper GI bleed   Chronic combined systolic and diastolic heart failure (HCC)   DCM (dilated cardiomyopathy) (HCC)   CAD (coronary artery disease)   Pulmonary HTN (HCC)   OSA (obstructive sleep apnea)   COPD (chronic obstructive pulmonary disease) ([place])   Acute on chronic respiratory failure with hypercapnia [facility])  Brief Hospital Course (including significant findings, care, treatment, and services provided and events leading to death)  Kristopher Arnt Sr. is a 72 y.o. year old male who presented to [facility] 08-Aug-2022 via EMS for OOH cardiac arrest. PMHx significant for PAH, OSA (on CPAP), COPD, HFrEF (Echo 09/2021 with EF 45-50%), NICM, CAD, pulmonary HTN, history of GIB.  Mr. Spack presented to [facility] 08/08/2022 via EMS after witnessed OOH cardiac arrest. He received immediate bystander CPR and was shocked several times for VF requiring shocks and PEA with multiple Epi administrations with ROSC. On arrival to ED, patient lost pulses again and required additional CPR x 2 rounds before ROSC. He was intubated peri-code. After intubation, he had significant hypotension requiring vasopressor initiation (NE, Epi, vaso). Central line and A-line were placed by EDP. Broad spectrum antibiotics were initiated. Initial EKG was c/f STEMI, though this was shortly after ROSC. Cardiology was consulted. PCCM was consulted for admission and assistance with medical management. While still in ED, patient had episode of  marked hypotension with cardiac monitoring showing Torsades for which cardioversion was completed. Troponins were elevated and amiodarone gtt, heparin gtt and TTM were initiated. Patient was deemed too unstable/not a candidate for intervention (cardiac cath, PCI etc) with prolonged CPR, severe anemia and no clear e/o STEMI. Heparin gtt subsequently discontinued due to severe anemia; 2U PRBCs given. Patient was transferred to ICU.  Later that night 08/08/22), PCCM was called to bedside for worsening hypotension despite high-dose pressors and persistent mild acidosis (pH 7.28). CXR negative for PTX, POCUS without e/o pericardial effusion and fair EF. Family was updated to patient's grim condition/poor prognosis but remained full code as they were hopeful for other family to be able to visit before his passing. Patient lost pulses again ~0300 with PEA arrest; ROSC was achieved with 2 rounds CPR then VT with additional 1 round CPR. Family was updated to the nature of patient's irreversible and likely nonsurvivable clinical status and decided to make patient DNR with plan to transition to comfort care once more family was present. Once family was able to visit, patient was compassionately extubated at 1420 09-Aug-2022 and comfort care was initiated. Patient expired at 1436.  Pertinent Labs and Studies  Significant Diagnostic Studies EEG adult  Result Date: August 09, 2022 Lora Havens, MD     08-09-22  8:51 AM Patient Name: Kristopher Lucy Tanton Sr. MRN: [id number] Epilepsy Attending: Lora Havens Referring Physician/Provider: Jacky Kindle, MD Date: Aug 08, 2022 Duration: 22.03 mins Patient history: 72yo M s/p cardiac arrest. EEG to evaluate for seizure Level of alertness: comatose AEDs during EEG study: Propofol Technical aspects: This EEG study was done with scalp electrodes positioned according  to the 10-20 International system of electrode placement. Electrical activity was reviewed with band pass filter of 1-'70Hz'$ ,  sensitivity of 7 uV/mm, display speed of 53m/sec with a '60Hz'$  notched filter applied as appropriate. EEG data were recorded continuously and digitally stored.  Video monitoring was available and reviewed as appropriate. Description: EEG showed continuous generalized background suppression, not reactive to stimulation.  Hyperventilation and photic stimulation were not performed.   EKG artifact was seen during this study. ABNORMALITY -Background suppression, generalized IMPRESSION: This study is suggestive of profound diffuse encephalopathy, nonspecific etiology but  given history of cardiac arrest is likely related to anoxic/hypoxic brain injury. No seizures or epileptiform discharges were seen throughout the recording. PLora Havens  DG Chest Port 1 View  Result Date: 203/06/24CLINICAL DATA:  72year old male with respiratory failure. Intubated. EXAM: PORTABLE CHEST 1 VIEW COMPARISON:  Portable chest 07/31/2022 and earlier. FINDINGS: Portable AP supine view at 0623 hours. Endotracheal tube and visible enteric tube are stable, satisfactory. Stable lung volumes and mediastinal contours. Increased retrocardiac opacity on the left and obscuration of the left hemidiaphragm. But elsewhere when allowing for portable technique the lungs are clear. No pneumothorax. Difficult to exclude small left pleural effusion. No pulmonary edema. Paucity of bowel gas in the visible abdomen. Stable visualized osseous structures. IMPRESSION: 1. Stable lines and tubes. 2. Progressive left lower lobe collapse or consolidation since yesterday. Difficult to exclude small left pleural effusion. Electronically Signed   By: HGenevie AnnM.D.   On: 003-06-202407:07   DG Chest Port 1 View  Result Date: 08/07/2022 CLINICAL DATA:  Respiratory failure EXAM: PORTABLE CHEST 1 VIEW COMPARISON:  07/20/2022 FINDINGS: Single frontal view of the chest demonstrates endotracheal tube overlying tracheal air column, tip approximately 5.2 cm above  carina. Enteric catheter passes below diaphragm tip excluded by collimation. Cardiac silhouette is enlarged. Increasing left basilar consolidation consistent with atelectasis. No significant effusion or pneumothorax. No acute bony abnormality. IMPRESSION: 1. Support devices as above. 2. Retrocardiac density consistent with left basilar consolidation, favor atelectasis. Electronically Signed   By: MRanda NgoM.D.   On: 07/27/2022 22:50   DG Chest Portable 1 View  Result Date: 07/19/2022 CLINICAL DATA:  Post OG and ET tube placement. EXAM: PORTABLE CHEST 1 VIEW COMPARISON:  August 01, 2022 FINDINGS: EKG leads project over the chest. Endotracheal tube at the level of the carina directed towards the RIGHT mainstem bronchus passing by the LEFT mainstem bronchus. Gastric tube in place coursing through and off of the field of the radiograph. Cardiomediastinal contours and hilar structures are stable accounting for slightly diminished lung volumes. Slight increased density at both the LEFT and RIGHT lung base since previous imaging. Signs of pulmonary emphysema. On limited assessment no acute skeletal process. IMPRESSION: 1. ETT at the carina directed towards the RIGHT mainstem bronchus. Suggest retraction approximately 3.5 cm for more optimal placement. 2. Diminished lung volumes with slight increased basilar opacities. Findings may reflect volume loss. In the current context would suggest attention on follow-up to exclude the possibility of aspiration related changes. 3. Signs of pulmonary emphysema. Critical Value/emergent results were called by telephone at the time of interpretation on 07/18/2022 at 6:00 pm to provider [facility], who verbally acknowledged these results. Electronically Signed   By: GZetta BillsM.D.   On: 07/25/2022 18:00   DG Abd Portable 1 View  Result Date: 07/26/2022 CLINICAL DATA:  OG and ETT placement EXAM: PORTABLE ABDOMEN - 1 VIEW COMPARISON:  07/20/2022 at  4:38 p.m.  FINDINGS: Enteric tube tip is demonstrated in the mid abdomen consistent with location in the distal stomach. Lung bases are clear. Endotracheal tube is incompletely visualized but tip appears to be at the origin of the right mainstem bronchus. IMPRESSION: Enteric tube tip projects over the mid abdomen consistent with location in the distal stomach. Endotracheal tube is incompletely visualized but tip appears to be at the origin of the right mainstem bronchus. Electronically Signed   By: Lucienne Capers M.D.   On: 07/23/2022 17:56   CT Angio Chest/Abd/Pel for Dissection W and/or Wo Contrast  Result Date: 07/14/2022 CLINICAL DATA:  Acute aortic syndrome suspected. Cardiac arrest post CPR. EXAM: CT ANGIOGRAPHY CHEST, ABDOMEN AND PELVIS TECHNIQUE: Non-contrast CT of the chest was initially obtained. Multidetector CT imaging through the chest, abdomen and pelvis was performed using the standard protocol during bolus administration of intravenous contrast. Multiplanar reconstructed images and MIPs were obtained and reviewed to evaluate the vascular anatomy. RADIATION DOSE REDUCTION: This exam was performed according to the departmental dose-optimization program which includes automated exposure control, adjustment of the mA and/or kV according to patient size and/or use of iterative reconstruction technique. CONTRAST:  145m OMNIPAQUE IOHEXOL 350 MG/ML SOLN COMPARISON:  CT chest 08/12/2016 FINDINGS: CTA CHEST FINDINGS Cardiovascular: Noncontrast CT images of the chest demonstrate scattered aortic calcification. No evidence of intramural hematoma. Images obtained during the arterial phase after administration of intravenous contrast material demonstrates normal caliber thoracic aorta. No evidence of aneurysm or dissection. Motion artifact in the ascending aorta. Normal heart size. No pericardial effusions. Central pulmonary arteries are patent without evidence of central pulmonary embolus. Mediastinum/Nodes:  Endotracheal and enteric tubes are present. Endotracheal tube tip is in the origin of the right mainstem bronchus and should be retracted. Enteric tube tip is in the body of the stomach. Thyroid gland is unremarkable. Esophagus is decompressed around the tube. No significant lymphadenopathy. Lungs/Pleura: Motion artifact limits evaluation. Emphysematous changes in the lungs. Diffuse mosaic attenuation pattern likely due to emphysema but could indicate edema or air trapping. Patchy infiltrates are suggested in the lung bases which could represent atelectasis, aspiration, or pneumonia. Musculoskeletal: Degenerative changes in the spine. Multiple acute appearing anterior rib fractures bilaterally, some demonstrating displacement. This is consistent with history of CPR. Review of the MIP images confirms the above findings. CTA ABDOMEN AND PELVIS FINDINGS VASCULAR Aorta: Scattered aortic calcification. Abdominal aorta is patent. No aneurysm or dissection. Celiac: Patent without evidence of aneurysm, dissection, vasculitis or significant stenosis. SMA: Patent without evidence of aneurysm, dissection, vasculitis or significant stenosis. Renals: Both renal arteries are patent without evidence of aneurysm, dissection, vasculitis, fibromuscular dysplasia or significant stenosis. IMA: The inferior mesenteric artery is minimally opacified and may be stenosis at its origin. Inflow: Patent without evidence of aneurysm, dissection, vasculitis or significant stenosis. Veins: There is a left femoral venous catheter present with tip in the left common iliac vein. Small amount of subcutaneous infiltration and gas in the left groin is consistent with insertion changes. Review of the MIP images confirms the above findings. NON-VASCULAR Hepatobiliary: No focal liver abnormality is seen. No gallstones, gallbladder wall thickening, or biliary dilatation. Pancreas: Unremarkable. No pancreatic ductal dilatation or surrounding inflammatory  changes. Spleen: Normal in size without focal abnormality. Adrenals/Urinary Tract: Adrenal glands are unremarkable. Kidneys are normal, without renal calculi, focal lesion, or hydronephrosis. Bladder is unremarkable. Stomach/Bowel: Stomach, small bowel, and colon are not abnormally distended. No wall thickening or inflammatory changes are demonstrated. Appendix is normal. Lymphatic: No  significant lymphadenopathy. Reproductive: Prostate gland is enlarged. The scrotum is incompletely included within the field of view. There is a large low-attenuation structure demonstrated in the right scrotum. This could represent a large hydrocele or enlarged right testicle. Consider scrotal ultrasound when appropriate to the patient's condition. Other: No free air or free fluid in the abdomen. Moderate periumbilical hernia containing fat. Musculoskeletal: Degenerative changes in the spine. Review of the MIP images confirms the above findings. IMPRESSION: 1. No evidence of aneurysm or dissection of the thoracic or abdominal aorta. Mild aortic atherosclerosis. 2. The tip of the endotracheal tube is in the right mainstem bronchus origin and should be retracted. 3. Diffuse mosaic attenuation pattern to the lungs may indicate edema or air trapping. Patchy infiltrates in the lung bases could indicate multifocal pneumonia, aspiration, or atelectasis. 4. The inferior mesenteric artery demonstrates limited enhancement and could be stenosed at its origin. 5. Prostate gland is enlarged. 6. Enlarged low-attenuation structure in the right scrotum is incompletely visualized and could represent a hydrocele or enlarged right testicle. Consider scrotal ultrasound when appropriate to the patient's condition. 7. Moderate periumbilical hernia containing fat. 8. Multiple bilateral anterior rib fractures, some with displacement. Electronically Signed   By: Lucienne Capers M.D.   On: 07/28/2022 17:24   CT HEAD WO CONTRAST  Result Date:  08/08/2022 CLINICAL DATA:  Mental status change of unknown cause. Cardiac arrest. Post CPR. EXAM: CT HEAD WITHOUT CONTRAST TECHNIQUE: Contiguous axial images were obtained from the base of the skull through the vertex without intravenous contrast. RADIATION DOSE REDUCTION: This exam was performed according to the departmental dose-optimization program which includes automated exposure control, adjustment of the mA and/or kV according to patient size and/or use of iterative reconstruction technique. COMPARISON:  None Available. FINDINGS: Brain: No evidence of acute infarction, hemorrhage, hydrocephalus, extra-axial collection or mass lesion/mass effect. Vascular: Intracranial arterial calcifications. Skull: Calvarium appears intact. Sinuses/Orbits: Mild mucosal thickening in the paranasal sinuses. No acute air-fluid levels. Right mastoid effusions. Other: OG and endotracheal tubes are present, incompletely visualized. IMPRESSION: No acute intracranial abnormalities. Electronically Signed   By: Lucienne Capers M.D.   On: 08/07/2022 17:12   DG Abd Portable 1V  Result Date: 07/14/2022 CLINICAL DATA:  NG tube placement EXAM: PORTABLE ABDOMEN - 1 VIEW COMPARISON:  None Available. FINDINGS: OG tube with tip in the stomach. Side port at the GE junction. Normal bowel-gas pattern. IMPRESSION: OG tube in stomach.  Side port at GE junction. Electronically Signed   By: Suzy Bouchard M.D.   On: 07/14/2022 16:59   DG Chest Port 1 View  Result Date: 07/22/2022 CLINICAL DATA:  OG tube placement after CPR EXAM: PORTABLE CHEST 1 VIEW COMPARISON:  05/03/2021 FINDINGS: An enteric tube has been placed. Tip projects over the left upper quadrant consistent with location in the body of the stomach. An endotracheal tube has been placed with tip measuring 6.1 cm above the carina. Shallow inspiration. Heart size and pulmonary vascularity are normal for technique. Lungs appear clear and expanded. No pleural effusions. No  pneumothorax. Visualized bones appear intact. IMPRESSION: Appliances appear in satisfactory location. No evidence of active pulmonary disease. Electronically Signed   By: Lucienne Capers M.D.   On: 07/21/2022 16:53    Microbiology No results found for this or any previous visit (from the past 240 hour(s)).  Lab Basic Metabolic Panel: Recent Labs  Lab 07/26/2022 1627 08/06/2022 1641 07/30/2022 1725 08/07/2022 1840 07/20/2022 2225 07/30/2022 2350 August 13, 2022 0256 2022-08-13 0440 Aug 13, 2022 0443  NA 134*   < >  135   < > 131* 130* 130* 129* 129*  K 4.6   < > 4.5   < > 3.0* 2.6* 2.7* 2.3* 2.3*  CL 89*  --  93*  --  91*  --   --  90*  --   CO2 20*  --   --   --  20*  --   --  21*  --   GLUCOSE 300*  --  291*  --  332*  --   --  299*  --   BUN 17  --  21  --  26*  --   --  29*  --   CREATININE 1.88*  --  1.70*  --  2.48*  --   --  2.91*  --   CALCIUM 8.0*  --   --   --  8.0*  --   --  7.6*  --   MG 2.6*  --   --   --  3.3*  --   --  2.6*  --   PHOS 11.3*  --   --   --   --   --   --  <1.0*  --    < > = values in this interval not displayed.   Liver Function Tests: Recent Labs  Lab 07/20/2022 1627  AST 107*  ALT 40  ALKPHOS 85  BILITOT 0.6  PROT 5.9*  ALBUMIN 3.1*   No results for input(s): "LIPASE", "AMYLASE" in the last 168 hours. No results for input(s): "AMMONIA" in the last 168 hours. CBC: Recent Labs  Lab 07/18/2022 1627 08/05/2022 1641 07/16/2022 2225 08/03/2022 2350 08/19/22 0036 08-19-2022 0256 19-Aug-2022 0440 19-Aug-2022 0443  WBC 24.6*  --  30.3*  --   --   --  21.2*  --   HGB 6.5*   < > 8.6* 10.9* 9.8* 11.6* 9.7* 11.6*  HCT 24.3*   < > 30.1* 32.0* 32.0* 34.0* 31.5* 34.0*  MCV 78.1*  --  75.6*  --   --   --  74.5*  --   PLT 272  --  371  --   --   --  300  --    < > = values in this interval not displayed.   Cardiac Enzymes: No results for input(s): "CKTOTAL", "CKMB", "CKMBINDEX", "TROPONINI" in the last 168 hours. Sepsis Labs: Recent Labs  Lab 07/28/2022 1627 07/25/2022 2225  Aug 19, 2022 0036 2022-08-19 0440  PROCALCITON <0.10  --   --  126.64  WBC 24.6* 30.3*  --  21.2*  LATICACIDVEN >9.0* 8.7* 7.7*  --    Procedures/Operations  No surgical or interventional procedures Multiple Codes with CPR, defibrillation, synchronize cardioversion Central and arterial line placements Intubation  Lestine Mount 08/03/2022, 3:02 PM

## 2022-08-10 DEATH — deceased

## 2022-08-17 ENCOUNTER — Other Ambulatory Visit: Payer: Self-pay

## 2022-12-03 ENCOUNTER — Ambulatory Visit: Payer: Medicare Other | Admitting: Family Medicine

## 2023-04-11 IMAGING — DX DG CHEST 1V PORT
1 series · 1 of 1 positions shown · non-contrast
Comparison: Chest radiograph 02/22/2021

CLINICAL DATA: Acute respiratory failure

EXAM:
PORTABLE CHEST 1 VIEW

[chest]
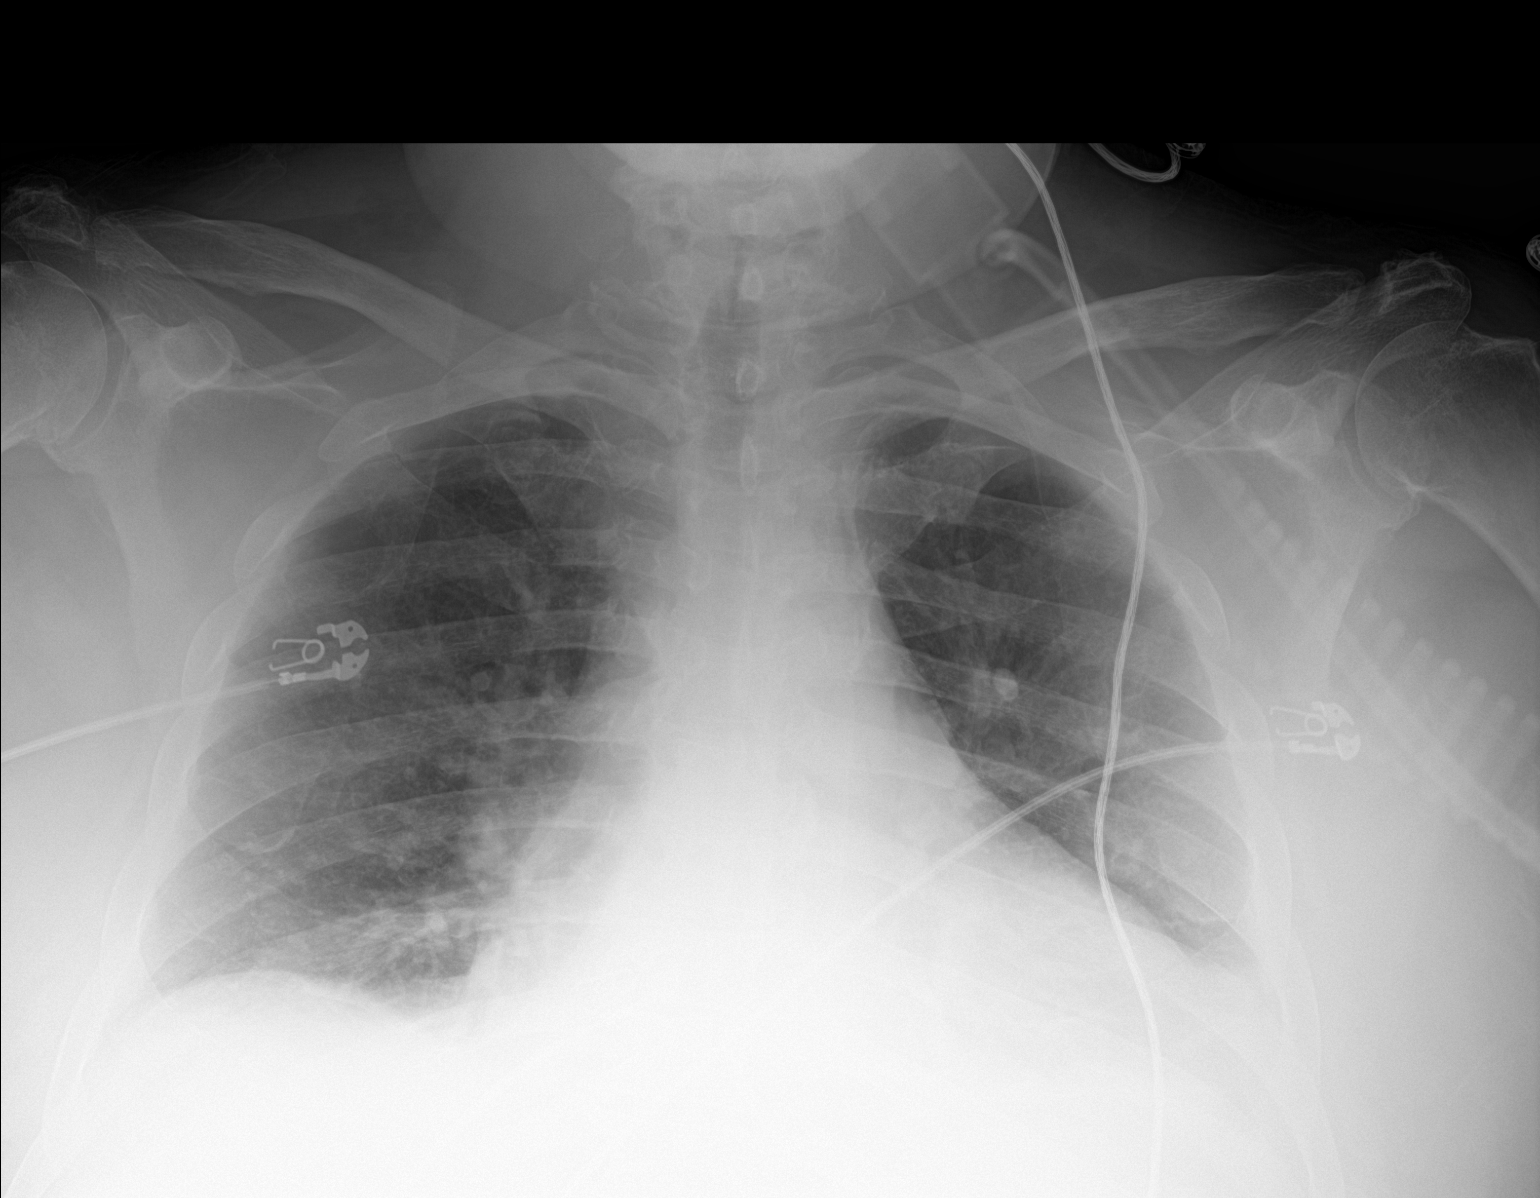

[1 of 1 positions shown; findings below may reference images not displayed]

FINDINGS: The heart is mildly enlarged, unchanged. The mediastinal contours
are stable.

Lung volumes are low. There is a small left pleural effusion with
adjacent airspace disease, similar to the study from two days ago.
There is no new focal airspace disease. There is no significant
right pleural effusion. There is no pneumothorax.

There is no acute osseous abnormality.
IMPRESSION: Low lung volumes with a small left pleural effusion and adjacent
airspace disease, not significantly changed.

## 2023-06-18 IMAGING — CR DG CHEST 2V
2 series · 2 of 2 positions shown · non-contrast
Comparison: 02/24/2021

CLINICAL DATA: Cough, shortness of breath

EXAM:
CHEST - 2 VIEW

[chest lat]
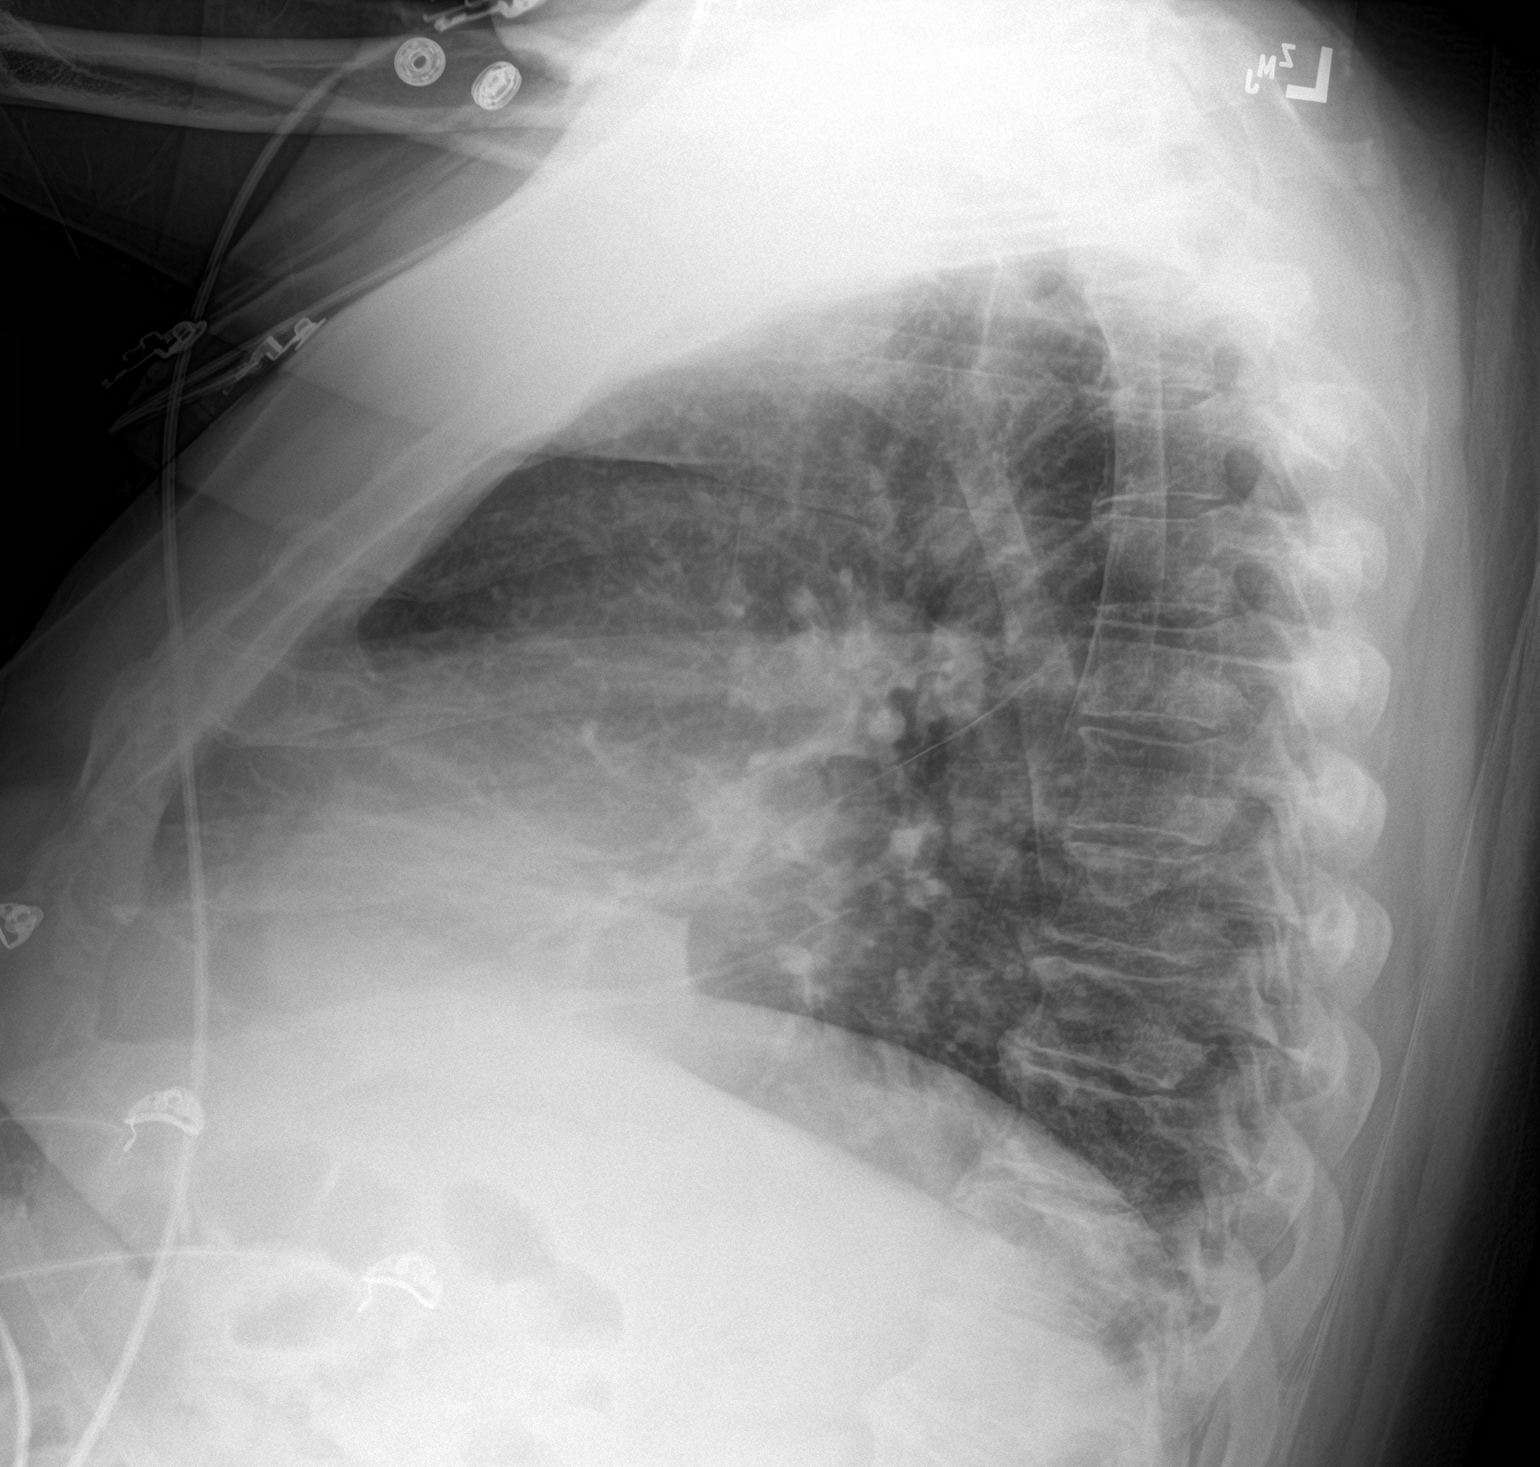

[chest ap]
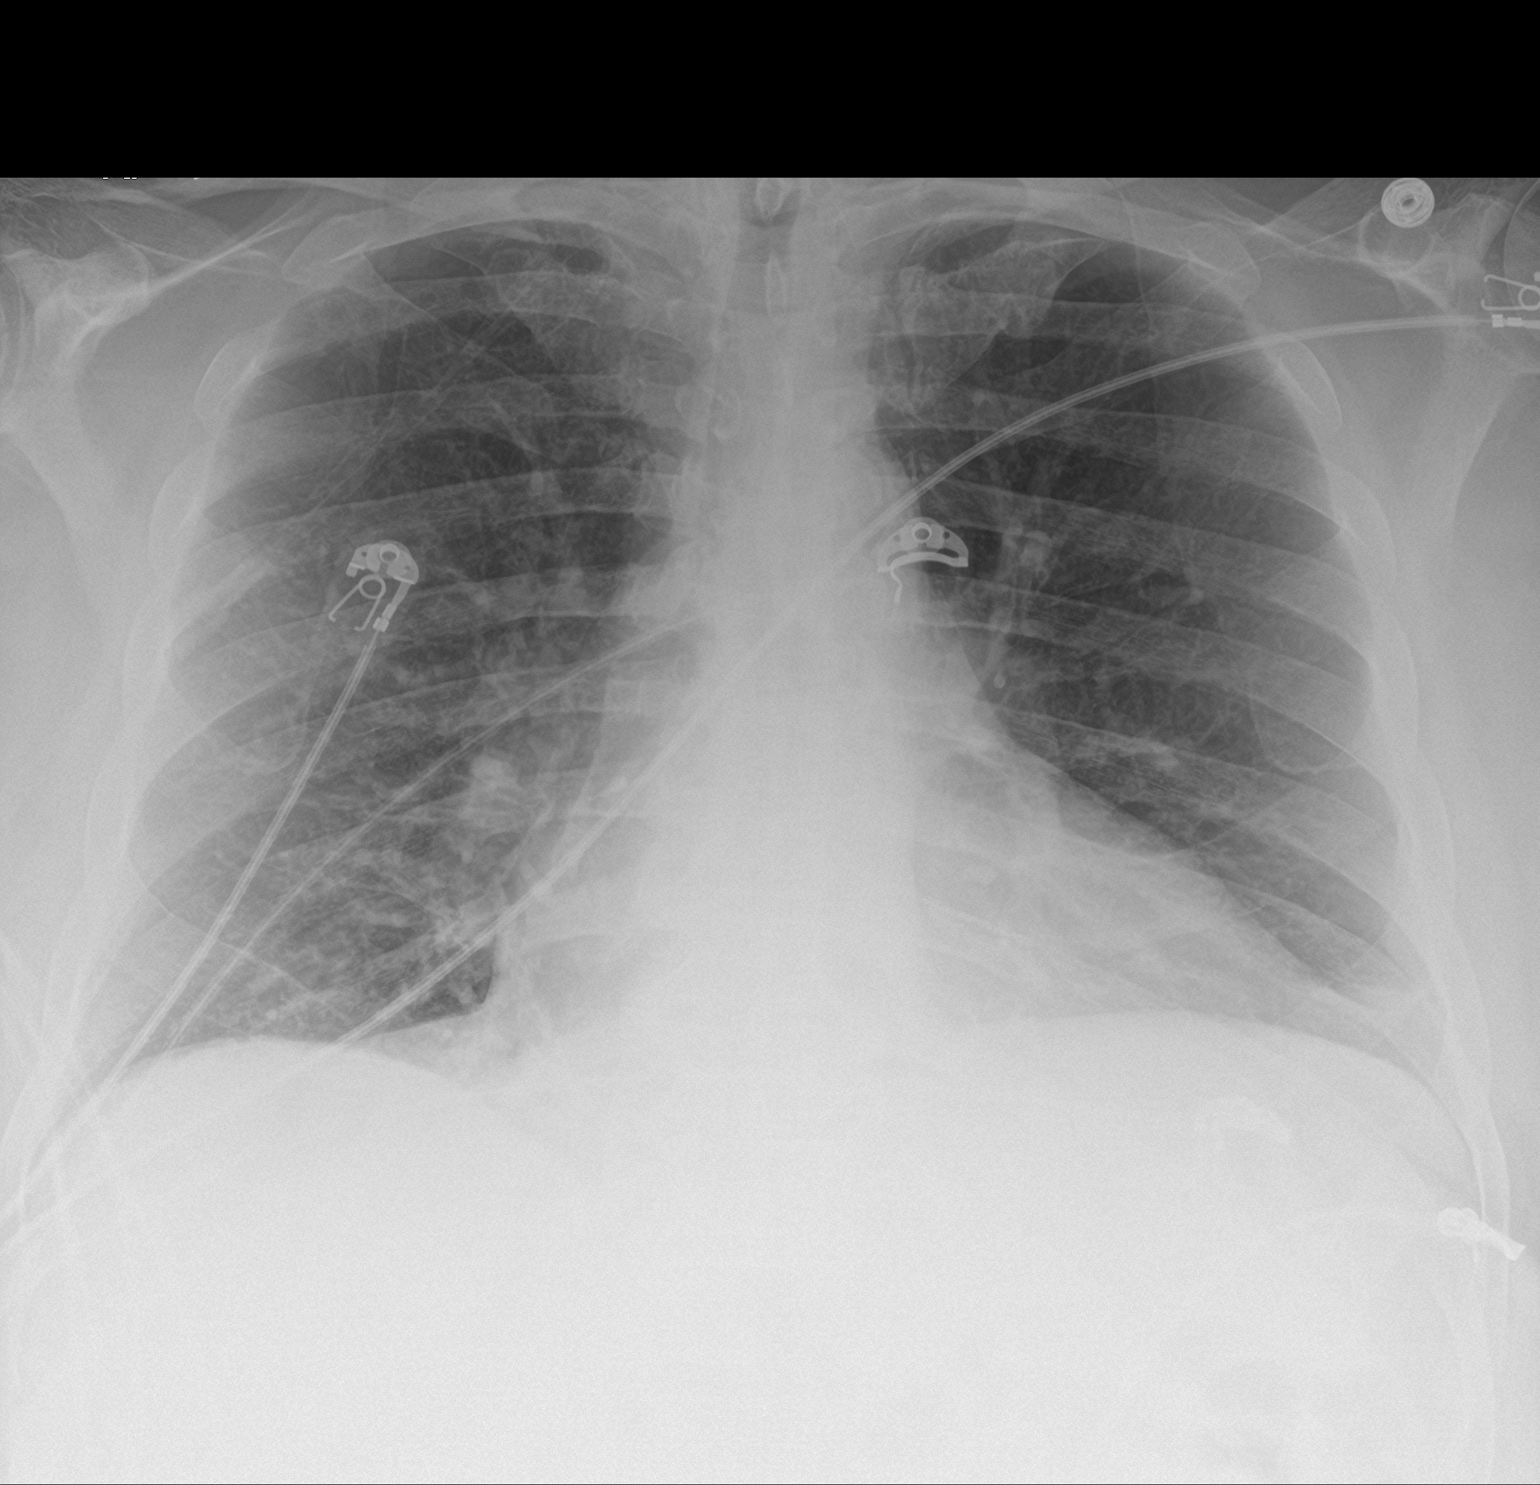

[2 of 2 positions shown; findings below may reference images not displayed]

FINDINGS: Stable cardiomediastinal contours. Low lung volumes with mild
bibasilar atelectasis. No new focal airspace consolidation. No
pleural effusion or pneumothorax.
IMPRESSION: Low lung volumes with mild bibasilar atelectasis.
